# Patient Record
Sex: Female | Born: 1955 | Race: Black or African American | Hispanic: No
Health system: Midwestern US, Community
[De-identification: ages and names within clinical notes are randomized; demographics above are authoritative.]

## PROBLEM LIST (undated history)

## (undated) DIAGNOSIS — M25562 Pain in left knee: Secondary | ICD-10-CM

## (undated) DIAGNOSIS — I10 Essential (primary) hypertension: Secondary | ICD-10-CM

## (undated) DIAGNOSIS — R0602 Shortness of breath: Secondary | ICD-10-CM

## (undated) DIAGNOSIS — IMO0001 Reserved for inherently not codable concepts without codable children: Secondary | ICD-10-CM

## (undated) DIAGNOSIS — E7800 Pure hypercholesterolemia, unspecified: Secondary | ICD-10-CM

## (undated) DIAGNOSIS — R5383 Other fatigue: Secondary | ICD-10-CM

## (undated) DIAGNOSIS — Z1239 Encounter for other screening for malignant neoplasm of breast: Secondary | ICD-10-CM

## (undated) DIAGNOSIS — M797 Fibromyalgia: Secondary | ICD-10-CM

## (undated) DIAGNOSIS — M1990 Unspecified osteoarthritis, unspecified site: Secondary | ICD-10-CM

## (undated) DIAGNOSIS — G35D Multiple sclerosis, unspecified: Secondary | ICD-10-CM

## (undated) DIAGNOSIS — E119 Type 2 diabetes mellitus without complications: Secondary | ICD-10-CM

## (undated) DIAGNOSIS — J45909 Unspecified asthma, uncomplicated: Secondary | ICD-10-CM

## (undated) DIAGNOSIS — G4730 Sleep apnea, unspecified: Secondary | ICD-10-CM

## (undated) DIAGNOSIS — C801 Malignant (primary) neoplasm, unspecified: Secondary | ICD-10-CM

## (undated) DIAGNOSIS — G35 Multiple sclerosis: Secondary | ICD-10-CM

## (undated) HISTORY — PX: KNEE SURGERY: SHX244

## (undated) HISTORY — PX: VAGINA SURGERY: SHX829

## (undated) HISTORY — PX: APPENDECTOMY: SHX54

## (undated) HISTORY — PX: CARDIAC DEFIBRILLATOR PLACEMENT: SHX171

## (undated) LAB — HM DIABETES EYE EXAM

## (undated) MED FILL — INTERFERON BETA-1A (ALBUMIN) 44 MCG/0.5 ML SUBCUTANEOUS PEN INJECTOR: 44 44 mcg/0.5 mL | SUBCUTANEOUS | 28 days supply | Qty: 6 | Fill #0

## (undated) MED FILL — REBIF REBIDOSE 44 MCG/0.5 ML SUBCUTANEOUS PEN INJECTOR: 44 44 mcg/0.5 mL | SUBCUTANEOUS | 28 days supply | Qty: 6 | Fill #0

---

## 2003-04-18 NOTE — Unmapped (Signed)
Signed by Dub Mikes MD on 04/18/2003 at 00:00:00  [facility]      Imported By: Coletta Memos 04/05/2006 10:27:20    _____________________________________________________________________    External Attachment:    Please see [employer] EMR for this document.

## 2003-05-02 NOTE — Unmapped (Signed)
Signed by Dub Mikes MD on 05/02/2003 at 00:00:00  [facility]      Imported By: Coletta Memos 04/05/2006 10:27:12    _____________________________________________________________________    External Attachment:    Please see [employer] EMR for this document.

## 2003-05-30 NOTE — Unmapped (Signed)
Signed by Dub Mikes MD on 05/30/2003 at 00:00:00  [facility]      Imported By: Coletta Memos 04/05/2006 10:27:04    _____________________________________________________________________    External Attachment:    Please see [employer] EMR for this document.

## 2004-02-20 NOTE — Unmapped (Signed)
Signed by Dub Mikes MD on 02/20/2004 at 00:00:00  [facility]      Imported By: Coletta Memos 04/05/2006 10:26:56    _____________________________________________________________________    External Attachment:    Please see [employer] EMR for this document.

## 2004-02-21 NOTE — Unmapped (Signed)
Signed by Dub Mikes MD on 02/21/2004 at 00:00:00  [facility]      Imported By: Kathlynn Grate 02/28/2008 15:46:25    _____________________________________________________________________    External Attachment:    Please see [employer] EMR for this document.

## 2004-05-28 NOTE — Unmapped (Signed)
Signed by   LinkLogic on 05/28/2004 at 21:39:45  Patient: Melanie [name]  Note: All result statuses are Final unless otherwise noted.    Tests: (1) CBC (CBC)  ! WBC                       8.9 10*3/uL                 4.5-11.0  ! RBC                       4.34 10*6/uL                3.90-5.40  ! Hgb                       13.8 g/dL                   16.1-09.6  ! HCT                       41.0 %                      35.0-45.0  ! MCV                       94.6 fL                     82.0-98.0  ! MCH                       31.7 pg                     27.0-34.0  ! MCHC                      33.5 g/dL                   04.5-40.9  ! RDW                       12.2 %                      11.5-14.5  ! Platelet Count            231 10*3/uL                 140-400  ! MPV                       9.4 fL                      7.5-10.5    Note: An exclamation mark (!) indicates a result that was not dispersed into   the flowsheet.  Document Creation Date: 05/28/2004 9:39 PM  _______________________________________________________________________    (1) Order result status: Final  Collection or observation date-time: 05/28/2004 15:17  Requested date-time: 05/28/2004 15:17  Receipt date-time: 05/28/2004 17:25  Reported date-time: 05/28/2004 21:39  Referring Physician:    Ordering Physician: Dub Mikes [facility])  Specimen Source: WB&WHOLE BLOOD     LV5  REFRIG&LAV  (Refrig)  Source: Butler Denmark Order Number: [id number]  Lab site: [facility]  [id number]

## 2004-05-28 NOTE — Unmapped (Signed)
Signed by   [employer]Logic on 05/30/2004 at 10:09:51  Patient: Melanie [name]  Note: All result statuses are Final unless otherwise noted.    Tests: (1) URINE CULTURE (UC)  ! Clinical Report           Result Below...        RESULT: Specimen: URINE  Collected: 05/28/2004 15:17  (1) Gram Stn = LabDecision; Antib. Sens. = LabDecision     Status: Final     Last Update: 05/30/2004 10:09          Culture Result: (Final)      Mixed Skin/Urogenital Flora.  No Further Workup.    Note: An exclamation mark (!) indicates a result that was not dispersed into   the flowsheet.  Document Creation Date: 05/30/2004 10:09 AM  _______________________________________________________________________    (1) Order result status: Final  Collection or observation date-time: 05/28/2004 15:17  Requested date-time: 05/28/2004 15:17  Receipt date-time: 05/28/2004 18:27  Reported date-time: 05/30/2004 10:09  Referring Physician:    Ordering Physician: Dub Mikes [facility])  Specimen Source: U&URINE     UGRS&Boric Acid, Small Urine Tube  Source: Butler Denmark Order Number: [id number]  Lab site: [facility]  [id number]

## 2004-05-28 NOTE — Unmapped (Signed)
Signed by   LinkLogic on 05/28/2004 at 20:55:01  Patient: Creedence Zimny  Note: All result statuses are Final unless otherwise noted.    Tests: (1) URINALYSIS (UA)  ! Color                     Yellow                      Yellow,Straw  ! Clarity                   Clear                       Clear  ! Specific Gravity.    [H]  1.037                       1.005-1.035  ! pH                        6.0                         5.0-8.0  ! Protein, urine       [A]  Trace mg/dL                 Negative  ! Glucose, Urine            Negative mg/dL              Negative  ! Ketone                    Negative mg/dL              Negative  ! Bilirubin, Urine          Negative                    Negative  ! Blood                     Negative                    Negative  ! Nitrite                   Negative                    Negative  ! Urobilinogen              0.2 EU/dL                   5.3-6.6  ! Leukocyte Esterase        Negative                    Negative  ! Squamous, Epith           4 /HPF                      0-5  ! Bacteria             [A]  Moderate /HPF               None Seen    Note: An exclamation mark (!) indicates a result that was not dispersed into   the flowsheet.  Document Creation Date: 05/28/2004 8:55 PM  _______________________________________________________________________    Marland Kitchen  1) Order result status: Final  Collection or observation date-time: 05/28/2004 15:17  Requested date-time: 05/28/2004 15:17  Receipt date-time: 05/28/2004 17:25  Reported date-time: 05/28/2004 20:54  Referring Physician:    Ordering Physician: Dub Mikes [facility])  Specimen Source: U&URINE     UGRLR&Boric Acid,Large Urine Tube Refrig.  Source: Butler Denmark Order Number: [id number]  Lab site: [facility]      [place]  [place]  [address]

## 2004-05-28 NOTE — Unmapped (Signed)
Signed by   LinkLogic on 05/28/2004 at 21:39:45  Patient: Melanie [name]  Note: All result statuses are Final unless otherwise noted.    Tests: (1) DIFFERENTIAL (DIFF)  ! Neutrophil                58.9 %                      40.0-80.0  ! Lymphocyte                31.7 %                      15.0-45.0  ! Monocyte                  7.4 %                       0.0-12.0  ! Eosinophil                1.4 %                       0.0-8.0  ! Basophils                 0.6 %                       0.0-1.0  ! ABS NEUT                  5.2 10*3/uL                 1.0-8.0  ! Abs LYMPH                 2.8 10*3/uL                 0.6-3.2  ! Abs MONO                  0.7 10*3/uL                 0.0-1.0  ! Abs EOS                   0.1 10*3/uL                 0.0-0.6  ! Abs BASO                  0.1 10*3/uL                 0.0-0.1    Note: An exclamation mark (!) indicates a result that was not dispersed into   the flowsheet.  Document Creation Date: 05/28/2004 9:39 PM  _______________________________________________________________________    (1) Order result status: Final  Collection or observation date-time: 05/28/2004 15:17  Requested date-time: 05/28/2004 15:17  Receipt date-time: 05/28/2004 17:25  Reported date-time: 05/28/2004 21:39  Referring Physician:    Ordering Physician: Dub Mikes [facility])  Specimen Source: WB&WHOLE BLOOD     LV5  REFRIG&LAV  (Refrig)  Source: Butler Denmark Order Number: [id number]  Lab site: [facility]  [address]

## 2004-08-20 NOTE — Unmapped (Signed)
Signed by Dub Mikes MD on 08/20/2004 at 00:00:00  [facility]      Imported By: Coletta Memos 04/05/2006 10:26:47    _____________________________________________________________________    External Attachment:    Please see [employer] EMR for this document.

## 2005-03-09 NOTE — Unmapped (Signed)
Signed by Sallye Ober Rivers MA on 03/09/2005 at 12:23:31      Preload Clinical Lists   Problems added:   BRONCHITIS, ACUTE (ICD-466.0)

## 2005-08-14 LAB — URINALYSIS W/RFL TO MICROSCOPIC
Bilirubin Urine: NEGATIVE
Glucose, Ur: NEGATIVE mg/dL
Ketones, UA: NEGATIVE
Leukocytes, UA: NEGATIVE
Nitrite, UA: NEGATIVE
Specific Gravity, UA: 1.025 (ref 1.005–1.035)
Squam Epithel, UA: 6 /lpf (ref 0–5)
Urobilinogen, UA: 0.2 E units/dL (ref 0.2–1.0)
WBC, UA: 2 cells/hpf (ref 0–10)
pH, UA: 5 (ref 5.0–8.0)

## 2005-08-14 LAB — DIFFERENTIAL, MANUAL
Basophils Relative: 0.1 % (ref 0.0–1.0)
Eosinophils Relative: 2.8 % (ref 0.0–8.0)
Lymphocytes Relative: 33 % (ref 15.0–45.0)
Monocytes Relative: 6.2 % (ref 0.0–12.0)
Neutrophils Absolute: 5.9 10*3/uL (ref 1.0–8.0)
Neutrophils Relative: 57.9 % (ref 40.0–80.0)

## 2005-08-14 LAB — CBC
Hematocrit: 43.4 % (ref 35.0–45.0)
Hemoglobin: 14 g/dL (ref 12.0–16.0)
MCH: 31.2 pg (ref 27.0–34.0)
MCV: 96.7 fL (ref 81.0–103.0)
MPV: 11 fL (ref 9.5–12.5)
Platelets: 226 10*3/uL (ref 140–400)
RBC: 4.49 10*6/uL (ref 3.90–5.40)
RDW: 13.3 % (ref 11.5–14.5)
WBC: 10.1 10*3/uL (ref 4.5–11.0)

## 2005-08-14 NOTE — Unmapped (Signed)
Signed by   LinkLogic on 08/14/2005 at 18:31:15  Patient: Melanie [name]  Note: All result statuses are Final unless otherwise noted.    Tests: (1) DIFFERENTIAL (DIFF)    Neutrophil                57.9 %                      40.0-80.0    Lymphocyte                33.0 %                      15.0-45.0    Monocyte                  6.2 %                       0.0-12.0    Eosinophil                2.8 %                       0.0-8.0    Basophils                 0.1 %                       0.0-1.0    ABS NEUT                  5.9 10*3/uL                 1.0-8.0  ! Abs LYMPH            [H]  3.3 10*3/uL                 0.6-3.2  ! Abs MONO                  0.6 10*3/uL                 0.0-1.0  ! Abs EOS                   0.3 10*3/uL                 0.0-0.6  ! Abs BASO                  0.0 10*3/uL                 0.0-0.1    Note: An exclamation mark (!) indicates a result that was not dispersed into   the flowsheet.  Document Creation Date: 08/14/2005 6:31 PM  _______________________________________________________________________    (1) Order result status: Final  Collection or observation date-time: 08/14/2005 00:01  Requested date-time: 08/14/2005 00:00  Receipt date-time: 08/14/2005 17:46  Reported date-time: 08/14/2005 18:32  Referring Physician:    Ordering Physician: Dub Mikes [facility])  Specimen Source: WB&WHOLE BLOOD     LV5  REFRIG&LAV  (Refrig)  Source: Butler Denmark Order Number: [id number]  Lab site: [facility]      [address]      [place] [address]  [phone]

## 2005-08-14 NOTE — Unmapped (Signed)
Signed by   LinkLogic on 08/14/2005 at 20:25:22  Patient: Melanie [name]  Note: All result statuses are Final unless otherwise noted.    Tests: (1) URINALYSIS (UA)    Color                     Yellow                      Yellow,Straw    Clarity                   Clear                       Clear    Specific Gravity.         1.025                       1.005-1.035    Specific Gravity          1.025                       1.005-1.035    pH                        5.0                         5.0-8.0    Protein, urine       [A]  Trace mg/dL                 Negative    Glucose, Urine            Negative mg/dL              Negative    Ketone                    Negative mg/dL              Negative    Bilirubin, Urine          Negative                    Negative  ! Blood                     Negative                    Negative    Nitrite                   Negative                    Negative    Urobilinogen              0.2 EU/dL                   4.0-9.8    Leukocyte Esterase        Negative                    Negative    WBC.                      2 /HPF                      0-10    Squamous,  Epith      [H]  6 /HPF                      0-5    Bacteria             [A]  Few /HPF                    None Seen    Note: An exclamation mark (!) indicates a result that was not dispersed into   the flowsheet.  Document Creation Date: 08/14/2005 8:25 PM  _______________________________________________________________________    (1) Order result status: Final  Collection or observation date-time: 08/14/2005 00:01  Requested date-time: 08/14/2005 00:00  Receipt date-time: 08/14/2005 17:46  Reported date-time: 08/14/2005 20:26  Referring Physician:    Ordering Physician: Dub Mikes [facility])  Specimen Source: U&URINE     UGRLR&Boric Acid,Large Urine Tube Refrig.  Source: Butler Denmark Order Number: [id number]  Lab site: [facility]      [address]      [place] [address]   [phone]      -----------------    The following lab values were dispersed to the flowsheet  with no units conversion:      WBC., 2 /HPF, (F)  expected units: cells/hpf    Squamous, Epith, 6 /HPF, (F)  expected units: /lpf    -----------------    The following non-numeric lab results were dispersed to  the flowsheet even though numeric results were expected:      Glucose, Urine, Negative

## 2005-08-14 NOTE — Unmapped (Signed)
Signed by   LinkLogic on 08/14/2005 at 18:31:16  Patient: Melanie [name]  Note: All result statuses are Final unless otherwise noted.    Tests: (1) CBC (CBC)    WBC                       10.1 10*3/uL                4.5-11.0    RBC                       4.49 10*6/uL                3.90-5.40    Hgb                       14.0 g/dL                   27.0-62.3    HCT                       43.4 %                      35.0-45.0    MCV                       96.7 fL                     81.0-103.0    MCH                       31.2 pg                     27.0-34.0    MCHC                      32.3 g/dL                   76.2-83.1    RDW                       13.3 %                      11.5-14.5    Platelet Count            226 10*3/uL                 140-400    MPV                       11.0 fL                     9.5-12.5    Note: An exclamation mark (!) indicates a result that was not dispersed into   the flowsheet.  Document Creation Date: 08/14/2005 6:31 PM  _______________________________________________________________________    (1) Order result status: Final  Collection or observation date-time: 08/14/2005 00:01  Requested date-time: 08/14/2005 00:00  Receipt date-time: 08/14/2005 17:45  Reported date-time: 08/14/2005 18:32  Referring Physician:    Ordering Physician: Dub Mikes [facility])  Specimen Source: WB&WHOLE BLOOD     LV5  REFRIG&LAV  (Refrig)  Source: Butler Denmark Order Number: [id number]  Lab site: [facility]      [address]  [place] [address]  [phone]      -----------------    The following results were not dispersed to the flowsheet  because of errors during the import process:      MCHC, 32.3 g/dL, (F)

## 2005-11-08 NOTE — Unmapped (Signed)
Signed by Kirtland Bouchard MA on 11/08/2005 at 15:39:13    Phone Note   Call from Patient  Call back at Home Phone: [phone]  Caller: patient  Department: Neurology  Call for: [facility]    Summary of Call:  WOULD LIKE SAMPLES OF PROBIDEL.  PLEASE CALL BACK AT HOME     Initial call taken by: Willette Alma,  November 08, 2005 1:43 PM    This department is not currently using the EMR system. Please see the paper chart for this patient for the response to this phone message.      Follow-up for Phone Call   patient called, provigil samples taken to front for pickup.  Phone Call Completed  Follow-up by: Kirtland Bouchard,  November 08, 2005 3:39 PM

## 2005-12-29 NOTE — Unmapped (Signed)
Signed by Donia Guiles MA on 12/29/2005 at 14:02:17    Phone Note   Call from Patient  Call back at Home Phone: [phone]  Patient Cell Phone #: [phone]  Department: Neurology  Call for: [facility]  Reason for Call: speak with nurse    Summary of Call:  CAN YOU GIVE APPT BEFORE APRIL  WANTS TO SPEAK TO THE NURSE ALSO,  NO FURTHER INFO     Initial call taken by: Burnis Medin,  December 29, 2005 12:31 PM    This department is not currently using the EMR system. Please see the paper chart for this patient for the response to this phone message.      Follow-up for Phone Call   Left message for pt to return call  Phone Call Completed  Follow-up by: Megan Mans MA,  December 29, 2005 2:02 PM

## 2005-12-29 NOTE — Unmapped (Signed)
Signed by Donia Guiles MA on 12/30/2005 at 10:38:41    Phone Note   Call from Patient  Patient Cell Phone #: [phone] OR [phone]  Department: Neurology  Call for: [facility]  Reason for Call: medication issues, speak with nurse    Summary of Call:  SAMPLES OF PROVIGIL.  DO YOU HAVE ANY?     Initial call taken by: Burnis Medin,  December 29, 2005 3:33 PM    This department is not currently using the EMR system. Please see the paper chart for this patient for the response to this phone message.      Follow-up for Phone Call   Left message stating we don't have any Provigil samples. I will call the rep and find out if/when samples were mailed to the [facility].  Phone Call Completed  Follow-up by: Megan Mans MA,  December 30, 2005 10:38 AM

## 2005-12-31 NOTE — Unmapped (Signed)
Signed by Donia Guiles MA on 12/31/2005 at 16:17:37    Phone Note   Call from Patient  Call back at Home Phone: [phone]  Patient Cell Phone #: [phone] OR [phone]  Department: Neurology  Call for: [facility]  Reason for Call: refill medication, speak with nurse    Summary of Call:  NEEDS RX CALLED IN  (PROVIGIL)  PHARM: [facility] [address] [facility] [place]     Initial call taken by: Burnis Medin,  December 31, 2005 4:04 PM      Follow-up for Phone Call   [facility] [phone] and phoned in Provigil 200 mg 1 po q am #30 x 5.   Phone Call Completed  Follow-up by: Megan Mans MA,  December 31, 2005 4:17 PM

## 2006-01-10 NOTE — Unmapped (Signed)
Signed by Donia Guiles MA on 01/11/2006 at 08:35:35    Phone Note   Call from Pharmacy  Patient Cell Phone #: [phone] OR [phone]    Caller: Clydene Fake  Pharmacy Phone Number: [phone]  Summary of call: *** This is a Prescription Request for a department that is currently using the EMR, but for a patient that does not have the requested medication (s) populated;    Requesting Medication:  Provigil Tab  Rx. Number: [id number]    Dose/ Strength: 200MG    Instructions: take one tablet by mouth inthe morning   Quantity: 30   Last Filled Date: 04-08-2005   Last Filled By:    Pharmacy Name:    Pharmacy Phone #:    Pharmacy Fax #:    UCP Provider Name:           Initial call taken by: Elizabeth Sauer,  January 10, 2006 9:42 AM      Follow-up for Phone Call   [facility] and refilled x 3. Pt has appt in April.   Phone call completed  Follow-up by: Megan Mans MA,  January 11, 2006 8:35 AM

## 2006-01-18 LAB — OFFICE VISIT LAB RESULTS
Bilirubin Urine: NEGATIVE
Blood, UA: NEGATIVE
Glucose, UA: NEGATIVE
Ketones, UA: NEGATIVE
Leukocytes, UA: NEGATIVE
Nitrite, UA: NEGATIVE
Protein, UA: NEGATIVE
Specific Gravity, UA: 1.005
Urobilinogen, UA: NORMAL
pH, UA: 6

## 2006-01-18 NOTE — Unmapped (Signed)
Signed by Kirtland Bouchard MA on 01/18/2006 at 10:49:44    Phone Note   Patient Call    Call back at Home Phone: [phone]  Department: Neurology  Call for: nurse      Reason for call: thinks she is having an attack.  please call to discuss  doesn't know what to do.      Initial call taken by: Burnis Medin,  January 18, 2006 10:08 AM      Follow-up for Phone Call   Called patient, she went to lay down to sleep, her lower back started hurting and she got chest pain. When she stood up she was dizzy. The worse time lasted about 2 minutes. I told her to call her PCP and get in to be checked out.  Phone Call Completed, Called Patient  Follow-up by: Kirtland Bouchard,  January 18, 2006 10:45 AM

## 2006-01-18 NOTE — Unmapped (Signed)
Signed by Simeon Craft MD on 01/18/2006 at 19:23:10      Reason for Visit   Chief Complaint: LBP, chest pain, leg pain, dizzy, SOB    History from: patient    Allergies  ! COMPAZINE    Medications     Vital Signs:   Wt: 240 lbs.      degrees F  oral  Pulse: 90 (irregular)    Patient appears to be in acute distress: no  BP: 136/88    Pulse Oximetry:   O2 Saturation: 93 % Urine Dip: Done     Intake recorded by: Sharmon Leyden MA on January 18, 2006 11:13 AM      History of Present Illness: Pt works at night, went to lay down to go to sleep any she suddenly felt pain that got her right back up and it shot upward ot posteroir right back . Had pain shot up from back of leg and arm. Pain is much better now.          Physical Examination:       Constitutional   Inspection: alert, no acute distress, well hydrated, well nourished, appropriate dress.      Respiratory:   Auscultation: clear to auscultation.      Cardiovascular:   Auscultation: no murmur.      Abdomen   Palpation/Auscultation: non-distended, non-tender, no guarding, normal bowel sounds.    Liver/Spleen: no organomegaly.      Musculoskeletal      Spine/Ribs/Pelvis    Comments:  tender alon Left paraspinal muscles.    In office Procedures & Tests   Date/Time Collected: 01-19-05    Routine Urinalysis     Physical characteristics   Color: yellow  Appearance: clear    Chemical measurements   Glucose (mg/dL): negative  Bilirubin: negative  Ketone (mg/dL): negative  Spec. Gravity: 1.005  Blood: negative  pH: 6  Protein (mg/dL): negative  Urobilinogen (mg/dL): normal  Nitrite: negative  Leukocytes: negative      New Problems:  SPASM, MUSCLE (ICD-728.85)  BACKACHE NOS (ICD-724.5)  New Medications:  PROVIGIL 200 MG TABS (MODAFINIL) 1 po bid  REBIF 22 MCG/0.5ML SOLN (INTERFERON BETA-1A) injects 3 x/week.  VICODIN 5-500 MG TABS (HYDROCODONE-ACETAMINOPHEN) 1 tab po q 6 hrs prn pain  FLEXERIL 10 MG TABS (CYCLOBENZAPRINE HCL) 1 tab o q 8 hours prn spasm  New Allergies:  !  COMPAZINE  Assessment and Plan  New Problems:  Dx of SPASM, MUSCLE (ICD-728.85)   left lumbar Onset: 01/18/2006  Dx of BACKACHE NOS (ICD-724.5)  Onset: 01/18/2006    Medications   New Prescriptions/Refills:  FLEXERIL 10 MG TABS (CYCLOBENZAPRINE HCL) 1 tab o q 8 hours prn spasm  #15 x 0 : Marlana Salvage Lauriann Milillo MD (01/18/2006)  VICODIN 5-500 MG TABS (HYDROCODONE-ACETAMINOPHEN) 1 tab po q 6 hrs prn pain  #20 x 0 : Marlana Salvage Markian Glockner MD (01/18/2006)    New medications:  PROVIGIL 200 MG TABS -- 1 po bid  Start date: 01/18/2006  REBIF 22 MCG/0.5ML SOLN -- injects 3 x/week.  Start date: 01/18/2006  VICODIN 5-500 MG TABS -- 1 tab po q 6 hrs prn pain  Start date: 01/18/2006  FLEXERIL 10 MG TABS -- 1 tab o q 8 hours prn spasm  Start date: 01/18/2006      Today's Orders   99213 - Ofc Vst, Est Level III [CPT-99213]  UA Dipstick (Office) [CPT-81002]            Prescriptions:  FLEXERIL 10 MG TABS (CYCLOBENZAPRINE HCL) 1 tab o q 8 hours prn spasm  #15 x 0   Entered and Authorized by: Simeon Craft MD   Signed by: Marlana Salvage [id number] MD on 01/18/2006   Method used: Print then Give to Patient  VICODIN 5-500 MG TABS (HYDROCODONE-ACETAMINOPHEN) 1 tab po q 6 hrs prn pain  #20 x 0   Entered and Authorized by: Simeon Craft MD   Signed by: Marlana Salvage [id number] MD on 01/18/2006   Method used: Print then Give to Patient            ]

## 2006-02-04 NOTE — Unmapped (Signed)
Signed by Donia Guiles MA on 02/04/2006 at 15:17:15    Phone Note   Patient Call    Department: Neurology  Call for: [facility]      Follow-up for Phone Call   Tried to call pt at work but they said she was not there. I called and left message at her home that I would leave samples at the front desk and right now I don't have  a sooner appt with Dr. Cherylann Banas.  Phone Call Completed  Follow-up by: Megan Mans MA,  February 04, 2006 3:17 PM    Reason for call: Wants to know if you have any samples of Provigil, would like to stop aby between 3-3:30 to pick some up. Would also like to know if she can get sooner appt.  Can reach at [phone]      Initial call taken by: Willette Alma,  February 04, 2006 2:53 PM    This department is not currently using the EMR system. Please see the paper chart for this patient for the response to this phone message.

## 2006-03-04 NOTE — Unmapped (Signed)
Signed by Simeon Craft MD on 03/04/2006 at 14:43:17      Reason for Visit   Chief Complaint: Flu like symptoms X 3 days    History from: patient    Allergies  ! COMPAZINE    Medications   Current Meds:   PROVIGIL 200 MG TABS (MODAFINIL) 1 po bid  REBIF 22 MCG/0.5ML SOLN (INTERFERON BETA-1A) injects 3 x/week.  VICODIN 5-500 MG TABS (HYDROCODONE-ACETAMINOPHEN) 1 tab po q 6 hrs prn pain  FLEXERIL 10 MG TABS (CYCLOBENZAPRINE HCL) 1 tab o q 8 hours prn spasm        Vital Signs:   Wt: 245 lbs.      Wt chg (lbs): 5  Temperature: 98.2  degrees F  oral    Patient appears to be in acute distress: no  BP: 148/70    Intake recorded by: Sharmon Leyden MA on March 04, 2006 11:40 AM      History of Present Illness: Began 3 days ago with body aches, head congestion. lost voice, cough, still some phlegm.          Physical Exam- Detail:   General Appearance: well-developed, well-nourished and in no acute distress.  Ears: No lesions.  Tympanic membranes translucent, non-bulging.  Canal walls pink, without discharge.  Hearing grossly intact.  Oropharynx: can't visualize pharynx very well due to tongue size  Respiratory: Respiration un-labored.  Lung fields clear to auscultation.  No wheezing, rales, rhonchi or pleural rub.  Neck: Full ROM.  No thyromegaly, no nodules, masses, or tenderness.   Cardiac: S1 and S2 normal.  RRR without murmurs, rubs, gallops.  No JVD.        New Problems:  UPPER RESPIRATORY INFECTION, VIRAL (ICD-465.9)  New Medications:  PROMETHAZINE-CODEINE 6.25-10 MG/5ML SYRP (PROMETHAZINE-CODEINE) 1 tsp po q 4-6 hours prn cough  ZITHROMAX Z-PAK 250 MG TABS (AZITHROMYCIN) take as directed    Assessment and Plan  Status of Existing Problems:  Assessed BRONCHITIS, ACUTE as comment only - Simeon Craft MD - Signed  New Problems:  Dx of UPPER RESPIRATORY INFECTION, VIRAL (ICD-465.9)  Onset: 03/04/2006    Medications   New Prescriptions/Refills:  ZITHROMAX Z-PAK 250 MG TABS (AZITHROMYCIN) take as directed  #1  pak x 0 :  Simeon Craft MD (03/04/2006)  PROMETHAZINE-CODEINE 6.25-10 MG/5ML SYRP (PROMETHAZINE-CODEINE) 1 tsp po q 4-6 hours prn cough  #4 oz x 0 : Simeon Craft MD (03/04/2006)    New medications:  PROMETHAZINE-CODEINE 6.25-10 MG/5ML SYRP -- 1 tsp po q 4-6 hours prn cough  Start date: 03/04/2006  ZITHROMAX Z-PAK 250 MG TABS -- take as directed  Start date: 03/04/2006      Today's Orders   99213 - Ofc Vst, Est Level III [CPT-99213]            Prescriptions:  ZITHROMAX Z-PAK 250 MG TABS (AZITHROMYCIN) take as directed  #1  pak x 0   Entered and Authorized by: Simeon Craft MD   Signed by: Marlana Salvage FAOZHYQM MD on 03/04/2006   Method used: Print then Give to Patient  PROMETHAZINE-CODEINE 6.25-10 MG/5ML SYRP (PROMETHAZINE-CODEINE) 1 tsp po q 4-6 hours prn cough  #4 oz x 0   Entered and Authorized by: Simeon Craft MD   Signed by: Marlana Salvage VHQIONGE MD on 03/04/2006   Method used: Print then Give to Patient

## 2006-03-04 NOTE — Unmapped (Signed)
Signed by Ames Coupe MA on 03/04/2006 at 12:15:22      Work Excuse   This patient was seen in the office 03/04/2006.  May not return to work until 03/07/2006.  Comments: Please excuse for 03-03-06 also.

## 2006-03-08 NOTE — Unmapped (Signed)
Signed by Simeon Craft MD on 03/08/2006 at 11:44:39      Work Excuse   This patient was seen in the office 03/04/2006.  May not return to work until 03/09/2006.

## 2006-03-08 NOTE — Unmapped (Signed)
Signed by Ames Coupe MA on 03/08/2006 at 17:51:09    Phone Note   Patient Call  Call back at Home Phone: [phone]  Caller: patient  Call for: AD    Reason for Call: speak with provider  Pharmacy Information: [phone]  [facility]   Summary of Call: pt was seen 03/04/06 and was given meds-pt stillhas headache and eyes feel puffy,pt's cough has sbusided some. Pt still coughs alot whenshe laughs and is leaking urine.   Pls call pt asap.     Initial call taken by: Rudi Coco,  March 08, 2006 10:28 AM      New Medications:  AUGMENTIN 500-125 MG TABS (AMOXICILLIN-POT CLAVULANATE) 1 tab po tid    Follow-up for Phone Call   Still coughing. Hasn't returned to work. Is no worse. COugh med made her drowsy. Stopped it . Eyes feel puffy. Has had allergies before. will call in abx again and may take claritin.   Follow-up by: Simeon Craft MD,  March 08, 2006 11:43 AM    Additional Follow-up for Phone Call   Phone Call Completed, Rx Completed  Additional Follow-up by: Sharmon Leyden MA,  March 08, 2006 5:51 PM  Prescriptions:  AUGMENTIN 500-125 MG TABS (AMOXICILLIN-POT CLAVULANATE) 1 tab po tid  #30 x 0   Entered and Authorized by: Simeon Craft MD   Signed by: Marlana Salvage OZHYQMVH MD on 03/08/2006   Method used: Telephoned to ...         call in.

## 2006-03-21 NOTE — Unmapped (Signed)
Signed by Donia Guiles MA on 03/21/2006 at 13:34:53    Phone Note   Patient Call    Call back at Home Phone: [phone]  Call for: [facility]      Follow-up for Phone Call   Spoke with pt. Told her I do have samples and I would leave them at the front desk for her to pick up.   Phone Call Completed  Follow-up by: Megan Mans MA,  March 21, 2006 1:34 PM    Reason for call: WANTS TO KNOW IF YOU SAMPLES OF PROVIGIL 200MG       Initial call taken by: Earnest Conroy,  March 21, 2006 12:52 PM

## 2006-03-29 NOTE — Unmapped (Signed)
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  VICODIN 5-500 MG TABS (HYDROCODONE-ACETAMINOPHEN) 1 tab po q 6 hrs prn pain  FLEXERIL 10 MG TABS (CYCLOBENZAPRINE HCL) 1 tab o q 8 hours prn spasm  PROMETHAZINE-CODEINE 6.25-10 MG/5ML SYRP (PROMETHAZINE-CODEINE) 1 tsp po q 4-6 hours prn cough  ZITHROMAX Z-PAK 250 MG TABS (AZITHROMYCIN) take as directed  AUGMENTIN 500-125 MG TABS (AMOXICILLIN-POT CLAVULANATE) 1 tab po tid    Allegies added:   ! COMPAZINE    Past History  Past Medical History:  Hypertension    Surgical History:  Hysterectomy: Partial , Knee surgery  Appendix       Family History: Laryngeal cancer, stomach cancer, skin cancer, brain cancer,   Sister with diabetes     Social History: Marital Status: divorced,   Employment Status: employed full-time,   Occupation: Teacher, music  Patient Lives at: apartment,   Alcohol Use: occasionally  Tobacco Usage:non-smoker

## 2006-03-31 NOTE — Unmapped (Signed)
Signed by Dub Mikes MD on 04/01/2006 at 07:33:06    Multiple Sclerosis Visit      History of Present Illness- Multiple Sclerosis:   Chief Compliant: Follow Up MS  History From: patient  Handedness: right  HPI: I had the privilege of seeing Mrs Mckone for a follow-up appointment. She was supposed to follow-up about a year ago, she tells me she had a hard time getting an appointment.   She tells me that overall her condition has been stable since our last visit. She recently had some infections requiring antibiotics, but beyond that her symptoms have been stable. Her main issue remains her fatigue. She is using Provigil, which is of great help. She works night shifts, and typically she sleeps between 8AM-2PM with some interruptions.   She is very clear on the fact that she developed no new symptoms. She mentioned that her eyes have become very tender to the point that she prefers not to use contact lenses. She asked me whether this could be MS related, and I consider that highly unlikely. She has no symptoms suggestive of ON or INO. She mentions HA-s which can be severe at times and most of the time they respond to OTC analgesics. She had a revent knee injury and still has some difficulty walking related to that.   On detailed ROS questioning she denied dysphagia or dysarthria, but mentions some constipation on and off.  Has increased frequency and urgency but she does not feel this is of major concern. She has some burning/buzzing tingling on and off, as well as some tendencies for pain.       Past History  Past Medical History (reviewed - no changes required):  Hypertension    Surgical History (reviewed - no changes required):  Hysterectomy: Partial , Knee surgery  Appendix       Family History (reviewed - no changes required): Laryngeal cancer, stomach cancer, skin cancer, brain cancer,   Sister with diabetes     Social History (reviewed - no changes required): Marital Status: divorced,   Employment Status:  employed full-time,   Occupation: Teacher, music  Patient Lives at: apartment,   Alcohol Use: occasionally  Tobacco Usage:non-smoker      Review of Systems   Refer to HPI for review of systems documentation.  General: Denies any specific issues at this time.   Ears/Nose/Throat: Denies any specific issues at this time.   Cardiovascular: Denies any specific issues at this time.   Respiratory: Denies any specific issues at this time.   Gastrointestinal: Denies any specific issues at this time.   Genitourinary: Denies any specific issues at this time.   Musculoskeletal: Denies any specific issues at this time.   Skin: Denies any specific issues at this time.   Neurologic: Denies any specific issues at this time.   Psychiatric: Denies any specific issues at this time.   Endocrine: Denies any specific issues at this time.   Heme/Lymphatic: Denies any specific issues at this time.   Allergic/Immunologic: Denies any specific issues at this time.       Intake-Neurology   Chief Complaint: Follow Up MS  Handedness: right    Vital Signs Weight: 238 pounds  Pulse rate: 60   BP #1: 140 / 94mm Hg Wt chg: -7      Provider Information:   Primary Care Provider: [name], MD  PCP Address: [address] [place], [place] [address]    Allergies  ! COMPAZINE  Allergy and adverse  reaction list reviewed during this update.    New Medication:  REBIF 44 MCG/0.5ML SOLN (INTERFERON BETA-1A) 1 injection TIW  LISINOPRIL 20 MG TABS (LISINOPRIL) 1 po QD  Meds Removed:  VICODIN 5-500 MG TABS (HYDROCODONE-ACETAMINOPHEN) 1 tab po q 6 hrs prn pain  PROMETHAZINE-CODEINE 6.25-10 MG/5ML SYRP (PROMETHAZINE-CODEINE) 1 tsp po q 4-6 hours prn cough  ZITHROMAX Z-PAK 250 MG TABS (AZITHROMYCIN) take as directed  AUGMENTIN 500-125 MG TABS (AMOXICILLIN-POT CLAVULANATE) 1 tab po tid    Intake recorded by: Megan Mans MA  March 31, 2006 4:21 PM          Physical Exam-Neurology   Appearance: well developed, well nourished and in no acute distress  Language/Speech:  no aphasia and no dysarthria  Atten/concentration: awake and alert  Orientation: oriented x3  Memory: grossly intact  Fund of Knowledge: grossly intact  Ophthalmoscopic: no papilledema/good venous pulsations  Pupils: equal, round, reactive to light and accomodation  Visual Fields:   normal  CN 3,4,6 (EOM): extraocular movements intact, normal gaze alignment, and no nystagmus  Cranial Nerve 5 (Trigeminal): bilateral facial sensation normal  Cranial Nerve 7 (Facial): normal facial symmetry and strength  CN 8 (Auditory): proper hearing to finger rub bilaterally  Cranial Nerve 9 (Glossophar): soft palate elevates in the midline spontaneously  CN 11 (spinal access): full shoulder shrug equally  Cranial Nerve 12 (Hypoglossal): tongue is midline on protrusion    Muscle Strength   Muscle strength is 5/5 in all major muscle groups.    Muscle Tone Examination   Muscle tone is normal in all muscle groups of the upper and lower extremities.    Reflexes   Muscle stretch reflexes are normal in bilateral upper and lower extremities with negative Babinski responses.    Sensory Examination   Normal to light touch, pinprick, temperature, vibration and proprioception.      Coordination/ Station & Gait:   Coordination is normal to rapid alternating movements and finger to nose testing.  Station and gait are normal to regular, heel, toe and tandem walk. Romberg is (-).        Medications   New medications:  REBIF 44 MCG/0.5ML SOLN -- 1 injection TIW  LISINOPRIL 20 MG TABS -- 1 po QD    Assessment and Plan Comments   RRMS, with fatigue. New onset HA-s, trasnient pain and sensory symptoms.  To control the above symtpoms, we will try Topamax. This will hopefully help with her weight loss efforts as well. I suggested she should have a thorough eye exam to determine why she can't tolerate contact lenses. I suggested a repeat MRI should be done in 3 months, followed by a return visit. I also suggested interferon labs (CBC and LFT-s) need to  be done, and a TSH.   We discussed the above recommendations in details and I answered all her questions. I encouraged her to call with questions or concerns at any time. I spent a total of 30 minutes with her, so I consider this a highly complex follow-up appointment.       Today's Orders   99215 - Ofc Vst, Est Level V [facility]               ]

## 2006-03-31 NOTE — Unmapped (Signed)
Signed by Donia Guiles MA on 03/31/2006 at 16:57:24    Clinical Lists Changes    Medications:  Added new medication of TOPAMAX 25 MG TABS (TOPIRAMATE) 1 po QHS x 1 week, then 1 po BID x 1 week, then 1 po QAM and 2 po QHS x 1 week then 2 po BID - Signed  Rx of TOPAMAX 25 MG TABS (TOPIRAMATE) 1 po QHS x 1 week, then 1 po BID x 1 week, then 1 po QAM and 2 po QHS x 1 week then 2 po BID;  #120 x 5;  Signed;  Entered by: Megan Mans MA;  Authorized by: Dub Mikes MD;  Method used: Print then Give to Patient

## 2006-04-07 NOTE — Unmapped (Signed)
Signed by Donia Guiles MA on 04/07/2006 at 14:09:54    Phone Note   Patient Call    Caller: patient  Department: Neurology  Call for: Dr Cherylann Banas    Reason for call: wants to know if we have any samples of topamax, call pt at home       Initial call taken by: Beckey Rutter,  April 07, 2006 1:35 PM      Follow-up for Phone Call   Spoke with pt. Notified her I only have 1 bottle of 25 mg tablets. Pt will come by office to pick up.   Phone Call Completed  Follow-up by: Megan Mans MA,  April 07, 2006 2:09 PM

## 2006-04-11 NOTE — Unmapped (Signed)
Signed by Donia Guiles MA on 04/11/2006 at 13:39:31    Phone Note   Patient Call    Call back at Home Phone: [phone]  Callers Cell Phone #: [phone]  Caller: patient  Department: Neurology  Call for: Dr Cherylann Banas    Follow-up for Phone Call   Left voice mail message for pt instructing on how to take Topamax.   Phone Call Completed  Follow-up by: Megan Mans MA,  April 11, 2006 1:39 PM    Reason for call: was given samples of new med last week but hasnt started yet she was unsure to the directions, please call pt at home , she works nights call after 1: 00pm      Initial call taken by: Beckey Rutter,  April 11, 2006 9:15 AM

## 2006-04-18 NOTE — Unmapped (Signed)
Signed by Chipper Oman on 04/18/2006 at 22:00:08    March 31, 2006        [name], M.D.  [address], [place] [address]  [place], [place]  [address]    RE: Melanie [name]   DOB:  [birth year]    Dear Dr. Vanita Panda:    I had the pleasure of seeing your patient, Melanie [name], in consultation in the [facility] on 03/31/2006.     History of Present Illness:  I had the privilege of seeing Melanie [name] for a follow-up appointment. She was supposed to follow-up about a year ago, she tells me she had a hard time getting an appointment.    She tells me that overall her condition has been stable since our last visit. She recently had some infections requiring antibiotics, but beyond that her symptoms have been stable. Her main issue remains her fatigue. She is using Provigil, which is of great help. She works night shifts, and typically she sleeps between 8AM-2PM with some interruptions.   She is very clear on the fact that she developed no new symptoms. She mentioned that her eyes have become very tender to the point that she prefers not to use contact lenses. She asked me whether this could be MS related, and I consider that highly unlikely. She has no symptoms suggestive of ON or INO. She mentions HA-s which can be severe at times and most of the time they respond to OTC analgesics. She had a recent knee injury and still has some difficulty walking related to that.     On detailed ROS questioning she denied dysphagia or dysarthria, but mentions some constipation on and off.  Has increased frequency and urgency but she does not feel this is of major concern. She has some burning/buzzing tingling on and off, as well as some tendencies for pain.      Impression & Plan Comments:  RRMS, with fatigue. New onset HA-s, transient pain and sensory symptoms.    To control the above symptoms, we will try Topamax. This will hopefully help with her weight loss efforts as well. I suggested she should have a  thorough eye exam to determine why she can't tolerate contact lenses. I suggested a repeat MRI should be done in 3 months, followed by a return visit. I also suggested interferon labs (CBC and LFT-s) need to be done, and a TSH.   We discussed the above recommendations in details and I answered all her questions. I encouraged her to call with questions or concerns at any time. I spent a total of 30 minutes with her, so I consider this a highly complex follow-up appointment.     Thank you for allowing me to participate in the care of this pleasant patient.  If you would like a copy of my office note, please contact our Medical Records department at [phone].  Please feel free to contact me should you have any concerns or questions.    Sincerely,        Dub Mikes, M.D.    IP/nrs

## 2006-05-03 NOTE — Unmapped (Signed)
Signed by Donia Guiles MA on 05/03/2006 at 14:04:06    Phone Note   Patient Call    Department: Neurology  Call for: [facility]      Summary of Call:  pt needs samples of Provigil as she is completely out , call her at home when she can pick up      Initial call taken by: Beckey Rutter,  May 03, 2006 12:59 PM      Follow-up for Phone Call   Spoke with pt. I have 2  boxes of Provigil that she will come and pick up  Phone Call Completed  Follow-up by: Megan Mans MA,  May 03, 2006 2:03 PM

## 2006-06-02 NOTE — Unmapped (Signed)
Signed by Donia Guiles MA on 06/02/2006 at 10:34:00    Prescriptions:  REBIF 44 MCG/0.5ML SOLN (INTERFERON BETA-1A) 1 injection TIW  #12 x 6   Entered by: Megan Mans MA   Authorized by: Dub Mikes MD   Signed by: Megan Mans MA on 06/02/2006   Method used: Telephoned to ...     [facility]     [address]     [place], [place]  [address]     Ph: [phone]     Fax: [phone]

## 2006-06-03 NOTE — Unmapped (Signed)
Signed by Donia Guiles MA on 06/03/2006 at 10:31:00    Phone Note   Patient Call    Call back at Home Phone: [phone]  Callers Cell Phone #: [phone]  Department: Neurology  Call for: Danielle    Reason for call: Needs samples of provigil today if possible, she is out.  Please call pt at home or on cell to let her know if she can get them      Initial call taken by: Willette Alma,  June 03, 2006 9:32 AM      Follow-up for Phone Call   Spoke with pt and notified her I do have some samples of Provigil. She come to office to pick up  Phone Call Completed  Follow-up by: Megan Mans MA,  June 03, 2006 10:30 AM

## 2006-06-17 NOTE — Unmapped (Signed)
Signed by Donia Guiles MA on 06/20/2006 at 10:49:54    Phone Note   Patient Call    Call back at Home Phone: [phone]  Caller's Cell Phone #: [phone]  Caller: patient  Call for: [facility]      Summary of Call:  PT WOULD LIKE DANIELLE TO CALL HER BEFORE SHE LEAVES TODAY.     Initial call taken by: Marinell Blight,  June 17, 2006 4:19 PM      Follow-up for Phone Call   Spoke with pt. She said she was unable to tolerate the Topamax due to side effects. She was wondereing about the grapefruit diet to make sure it was ok for her to do. Notified pt that was fine to do.   Phone Call Completed  Follow-up by: Megan Mans MA,  June 20, 2006 10:49 AM

## 2006-07-05 NOTE — Unmapped (Signed)
Signed by Donia Guiles MA on 07/05/2006 at 11:08:10    Phone Note   Patient Call    Call back at Home Phone: [phone]  Caller: patient  Call for: [facility]      Summary of Call:  PT CALLING TO SPEAK WITH DANIELLE. WOULD LIKE TO KNOW IF YOU HAVE ANYMORE SAMPLES OF THE PROVIGIL 200MG  TAB. AND ALSO STATING THAT SHE'S EXPERIENCING SOME LOWER BACK PAIN. PT STATING SHE HAS TRIED TAKING ADVIL FOR THIS PAIN BUT HASN'T HELPED.     Initial call taken by: Marinell Blight,  July 05, 2006 10:30 AM      Follow-up for Phone Call   Spoke with pt and told her I do have some samples of Provigil. Will leave at the front desk. Pt made appt with PCP regarding her back pain.   Phone Call Completed  Follow-up by: Megan Mans MA,  July 05, 2006 11:08 AM

## 2006-07-05 NOTE — Unmapped (Signed)
Signed by Ames Coupe MA on 07/05/2006 at 16:20:19      Work Excuse   This patient was seen in the office 07/05/2006.  May not return to work until 07/06/2006.  Comments: Pt was also missed 07-04-06 please excuse.

## 2006-07-05 NOTE — Unmapped (Signed)
Signed by Simeon Craft MD on 07/05/2006 at 18:32:37      Reason for Visit   Chief Complaint: LBP X 2 days    History from: patient    Allergies  ! COMPAZINE    Medications   Current Meds:   PROVIGIL 200 MG TABS (MODAFINIL) 1 po bid  REBIF 44 MCG/0.5ML SOLN (INTERFERON BETA-1A) 1 injection TIW  FLEXERIL 10 MG TABS (CYCLOBENZAPRINE HCL) 1 tab o q 8 hours prn spasm  LISINOPRIL 20 MG TABS (LISINOPRIL) 1 po QD  TOPAMAX 25 MG TABS (TOPIRAMATE) 1 po QHS x 1 week, then 1 po BID x 1 week, then 1 po QAM and 2 po QHS x 1 week then 2 po BID        Vital Signs:   Wt: 247 lbs.      Wt chg (lbs): 9    Patient appears to be in acute distress: no  BP: 136/84    Intake recorded by: Sharmon Leyden MA on July 05, 2006 3:13 PM      History of Present Illness: Pt here with c/o low back pain for 2 days. feels a little better today. 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 Has a stool the she can climb. every now and then she just reaches rather than use the stool. 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 No pain radiating to leg.         Tried a Furniture conservator/restorer pill for bach aches. Pt points to lower lumbar spine.      Review of Systems       Physical Examination:     Additional Vitals:   BP: 136/  84  Lumbar Spine: mild lumbar paraspinal muscle tenderness.        New Problems:  LUMBAR STRAIN, ACUTE (ICD-847.2)  New Medications:  VICODIN 5-500 MG TABS (HYDROCODONE-ACETAMINOPHEN) 1 tab by mouth every 6 hours as needed pain  NAPROSYN 500 MG TABS (NAPROXEN) 1 tab by mouth every 12 hours    Assessment and Plan  New Problems:  Dx of LUMBAR STRAIN, ACUTE (ICD-847.2)  Onset: 07/05/2006    Medications   New Prescriptions/Refills:  NAPROSYN 500 MG TABS (NAPROXEN) 1 tab by mouth every 12 hours  #30 x 0 : Marlana Salvage Tanay Massiah MD (07/05/2006)  VICODIN 5-500 MG TABS (HYDROCODONE-ACETAMINOPHEN) 1 tab by mouth every 6 hours as needed pain  #20 x 0 : Marlana Salvage [id number] MD (07/05/2006)  FLEXERIL 10 MG TABS  (CYCLOBENZAPRINE HCL) 1 tab o q 8 hours prn spasm  #30 x 0 : Marlana Salvage Braxton Weisbecker MD (07/05/2006)    New medications:  VICODIN 5-500 MG TABS -- 1 tab by mouth every 6 hours as needed pain  Start date: 07/05/2006  NAPROSYN 500 MG TABS -- 1 tab by mouth every 12 hours  Start date: 07/05/2006      Today's Orders   99213 - Ofc Vst, Est Level III [CPT-99213]              Prescriptions:  NAPROSYN 500 MG TABS (NAPROXEN) 1 tab by mouth every 12 hours  #30 x 0   Entered and Authorized by: Simeon Craft MD   Signed by: Marlana Salvage [id number] MD on 07/05/2006   Method used: Print then Give to Patient  VICODIN 5-500 MG TABS (HYDROCODONE-ACETAMINOPHEN) 1 tab by mouth every 6 hours as needed pain  #20 x 0   Entered and Authorized by:  Simeon Craft MD   Signed by: Marlana Salvage [id number] MD on 07/05/2006   Method used: Print then Give to Patient  FLEXERIL 10 MG TABS (CYCLOBENZAPRINE HCL) 1 tab o q 8 hours prn spasm  #30 x 0   Entered and Authorized by: Simeon Craft MD   Signed by: Marlana Salvage [id number] MD on 07/05/2006   Method used: Print then Give to Patient              ]

## 2006-07-11 NOTE — Unmapped (Signed)
Signed by Dub Mikes MD on 07/11/2006 at 00:00:00  Medical Certification of Health Care Provider      Imported By: Maryellen Pile 07/25/2006 08:30:44    _____________________________________________________________________    External Attachment:    Please see [facility] EMR for this document.

## 2006-07-11 NOTE — Unmapped (Signed)
Signed by Donia Guiles MA on 07/11/2006 at 11:08:08    Phone Note   Patient Call    Call back at Home Phone: [phone]  Department: Neurology  Call for: Danielle    Reason for call: FMLA at work at work has expired, please fill out  another form and fax to benefits office hopefully by tomorrow. Will call back with fax #.  Please call to let her know if it can't be done by tomorrow, please call home and leave a message.      Initial call taken by: Willette Alma,  July 11, 2006 10:54 AM      Follow-up for Phone Call   [facility] [employer] TO-ATTN [phone]  Follow-up by: Blanch Media,  July 11, 2006 11:02 AM    Additional Follow-up for Phone Call   Called pt and told her she would have to have her HR dept fax me the [facility] paperwork. I do not have it. Pt will have them fax  Phone Call Completed  Additional Follow-up by: Megan Mans MA,  July 11, 2006 11:08 AM

## 2006-08-04 NOTE — Unmapped (Signed)
Signed by Donia Guiles MA on 08/04/2006 at 11:54:28    Phone Note   Patient Call    Call back at Home Phone: [phone]  Call for: [facility]      Summary of Call:  PT WOULD LIKE TO COME PICK UP PROVIGIL SAMPLES TODAY. PLEASE CALL HOME NUMBER.     Initial call taken by: Blanch Media,  August 04, 2006 10:21 AM      Follow-up for Phone Call   Spoke with pt and told her I would leave samples up at the front desk for her to pick up  Phone Call Completed, Called Patient  Follow-up by: Megan Mans MA,  August 04, 2006 11:54 AM

## 2006-09-01 ENCOUNTER — Ambulatory Visit

## 2006-09-07 NOTE — Unmapped (Signed)
Signed by Donia Guiles MA on 09/07/2006 at 14:52:55    Phone Note   Patient Call    Call back at Home Phone: [phone]  Caller: patient  Call for: [facility]    Reason for call: PT CALLING TO SPEAK WITH THE NURSE TO SEE IF SHE HAS ANY SAMPLES OF PROVIGIL THAT SHE COULD HAVE.       Initial call taken by: Marinell Blight,  September 07, 2006 2:26 PM      Follow-up for Phone Call   Tried to reach pt several times but line was busy. I left samples at the front desk for her to pick up  Phone Call Completed, Called Patient  Follow-up by: Megan Mans MA,  September 07, 2006 2:52 PM

## 2006-10-03 NOTE — Unmapped (Signed)
Signed by Donia Guiles MA on 10/03/2006 at 14:23:45    Phone Note   Patient Call    Call back at Home Phone: [phone]  Call for: [facility]      Summary of Call:  PT STATED THAT SHE NEEDS SAMPLES OF PROVIGIL. STATED TO CALL WHEN READY, AND SHE WILL PICK UP.     Initial call taken by: Blanch Media,  October 03, 2006 2:01 PM      Follow-up for Phone Call   Will leave samples at front desk. Spoke with pt and informed her of this.   Phone Call Completed, Called Patient  Follow-up by: Megan Mans MA,  October 03, 2006 2:23 PM

## 2006-10-21 NOTE — Unmapped (Signed)
Signed by Donia Guiles MA on 10/21/2006 at 11:13:10    Phone Note   Patient Call  Caller's Cell Phone #: [phone]  Caller: patient  Department: Neurology  Call for: PRIKO    Summary of Call: PT'S DAUGHTER IS COMING TO TO PICK UP PROVIGIL SAMPLES  SHE SAID THE MEDICATION THAT WAS CALLED WAS FOR PROVIGIL WAS CALLED IN BY MISTAKE SHOULD HAVE BEEN  ANOTHER MED CALLED IN. SHE WOULD LIKE YOU TO CALL HER  ON HER CELL PHONE TO DISCUSS THIS. IN THE MEANTIME THE  DAUGHTER IS COMING IN TO PICK UP PROVIGIL SAMPLES.  THE PT SAID SHE GETS SAMPLES EVERY MONTH     Initial call taken by: Earnest Conroy,  October 21, 2006 10:16 AM      Follow-up for Phone Call   Spoke with pt. She said the pharmacy called for the wrong refill. It was suppose to be for Rebif not Provigil. Pt will be coming to pick up samles. Left at the front desk. Pt already had refills on Rebif so I didn't need to call into pharmacy  phone call completed, appointment scheduled today  Follow-up by: Megan Mans MA,  October 21, 2006 11:13 AM

## 2006-10-21 NOTE — Unmapped (Signed)
Signed by Donia Guiles MA on 10/21/2006 at 09:48:11    Prescriptions:  PROVIGIL 200 MG TABS (MODAFINIL) 1 po bid  #60 x 5   Entered by: Megan Mans MA   Authorized by: Dub Mikes MD   Signed by: Megan Mans MA on 10/21/2006   Method used: Telephoned to ...     [facility]     [address]     [place], [place]  [address]     Ph: [phone]     Fax: [phone]    REQUEST FROM PHARMACY.

## 2006-11-16 NOTE — Unmapped (Signed)
Signed by Simeon Craft MD on 11/16/2006 at 07:44:23    Clinical Lists Changes    Medications:  Changed medication from LISINOPRIL 20 MG TABS (LISINOPRIL) 1 po QD to LISINOPRIL-HYDROCHLOROTHIAZIDE 20-25 MG TABS (LISINOPRIL-HYDROCHLOROTHIAZIDE) 1 by mouth daily - Signed  Rx of LISINOPRIL-HYDROCHLOROTHIAZIDE 20-25 MG TABS (LISINOPRIL-HYDROCHLOROTHIAZIDE) 1 by mouth daily;  #30 x 3;  Signed;  Entered by: Simeon Craft MD;  Authorized by: Simeon Craft MD;  Method used: Telephoned to

## 2006-11-24 NOTE — Unmapped (Signed)
Signed by Donia Guiles MA on 11/24/2006 at 14:03:46    Phone Note   Patient Call    Call back at Home Phone: [phone]  Call for: [facility]      Summary of Call:  PT REQUEST SAMPLES OF PROVIGIL TODAY IF POSSIBLE. PLEASE CALL HOME NUMBER.     Initial call taken by: Blanch Media,  November 24, 2006 1:18 PM      Follow-up for Phone Call   Spoke with pt and ifnormed her I would leave samples out at the front desk for her.   Phone Call Completed, Called Patient  Follow-up by: Megan Mans MA,  November 24, 2006 2:03 PM

## 2007-01-17 NOTE — Unmapped (Signed)
Signed by Donia Guiles MA on 01/17/2007 at 10:21:59    Phone Note   Patient Call    Call back at Home Phone: [phone]  Call for: [facility]      Summary of Call:  PT STATED THAT SHE NEEDS SAMPLS OF PROVIGIL-SHE IS COMPLETLY OUT. STATED THAT SHE WOULD LIKE FOR A FRIEND OF HERS PICK THEM UP-BRENDA STIVERS. PLEASE CALL HOME NUMBER.      Initial call taken by: Blanch Media,  January 17, 2007 10:17 AM      Follow-up for Phone Call   Spoke with pt and informed her I do have samples and will leave out at the front desk to pick up.   Phone Call Completed, Called Patient  Follow-up by: Megan Mans MA,  January 17, 2007 10:21 AM

## 2007-01-23 NOTE — Unmapped (Signed)
Signed by Donia Guiles MA on 01/23/2007 at 08:55:34    Phone Note   Patient Call    Call back at Home Phone: [phone]  Call for: [facility]      Summary of Call:  PT STATED THAT SHE WOULD LIKE TO KNOW WHEN THE LAST TIME SHE HAD AN IV TX WAS. STATED THAT SHE HAS A DRS APPT AT 10:30AM, AND WOULD LIKE THIS INFO FOR THAT APPT. PLEASE CALL HOME NUMBER, IF UNAVAIL PLEASE LEAVE INFO ON MACHINE.     Initial call taken by: Blanch Media,  January 23, 2007 8:49 AM      Follow-up for Phone Call   Spoke with pt and informed her according to records last solumedrol was Sept 2006. She was also transferred to scheduling to make f/u appt.   Phone Call Completed, Appt Scheduled, Called Patient  Follow-up by: Megan Mans MA,  January 23, 2007 8:55 AM

## 2007-01-26 NOTE — Unmapped (Signed)
Signed by Donia Guiles MA on 01/27/2007 at 95:62:13    Phone Note   Patient Call    Call back at Home Phone: [phone]  Caller: patient  Call for: [facility]      Summary of Call:  PT CALLING STATING SHE HAS AN APPT NEXT THURSDAY AND THINKS SHE WAS TOLD THE LAST TIME SHE WAS SEEN TO SCHEDULED AN MRI BEFORE SHE'S SEEN. PT STATING SHE'S GETTING READY TO GO TO BED SO DON'T CALL HER BACK TIL TOMORROW.      Initial call taken by: Marinell Blight,  January 26, 2007 11:30 AM      New Orders:  MRI, Brain w/ and w/o gadolinium [CPT-70559]  MRI, Spine, Cervical w/ & w/o gadolinium [CPT-[id number]]  Follow-up for Phone Call   Spoke with pt and gave her MRI appt date and time. Pt understood  Phone Call Completed, Called Patient  Follow-up by: Megan Mans MA,  January 27, 2007 8:46 AM

## 2007-01-31 NOTE — Unmapped (Signed)
Signed by   [employer] on 01/31/2007 at 13:29:16  Patient: Melanie [name]  Note: All result statuses are Final unless otherwise noted.    Tests: (1) MRI-C-SPINE W/O CONTRAST ([id number])    Order NotePricilla Handler Order Number: [id number]    Order Note:     *** VERIFIED [facility]  Reason:  340 MULTIPLE SCLEROSIS  Dict.Staff: Araceli Bouche [id number]    Verified By: Araceli Bouche        Ver: 01/31/07   1:31 pm  Exams:  MRI-C-SPINE W/O CONTRAST        Dictated 01/31/07 at 08:46    01/31/07  MRI OF THE CERVICAL SPINE:    INDICATION:  History of a mass.    The study is degraded secondary to patient motion.  The patient  could not tolerate repeat imaging.    FINDINGS:    There is normal alignment of the cervical spine.  No definite  intrinsic cord signal abnormality is evident.    At the C4-C5 disk space level, there is a mild diffuse disk  protrusion.  This partially effaces the ventral thecal sac  without cord impingement.    At the C5-C6 disk space level, there is a mild diffuse disk  protrusion.  This partially effaces the ventral thecal sac  without significant central stenosis or cord impingement.    The remainder of the disk space levels is unremarkable.    IMPRESSION:    1.  MILD DIFFUSE DISK PROTRUSIONS AT THE C4-C5 AND C5-C6 LEVELS.    THESE ARE ASSOCIATED WITH BROAD VENTRAL THECAL SAC IMPRESSIONS  WITHOUT SIGNIFICANT CENTRAL STENOSIS OR CORD IMPINGEMENT.    2.  MOTION DEGRADED STUDY.  THIS LIMITS EVALUATION FOR A SUBTLE  SIGNAL ABNORMALITY.  NO DEFINITE INTRINSIC CORD SIGNAL  ABNORMALITY IS EVIDENT.    Lajoyce Corners  **** end of result ****    Note: An exclamation mark (!) indicates a result that was not dispersed into   the flowsheet.  Document Creation Date: 01/31/2007 1:29 PM  _______________________________________________________________________    (1) Order result status: Final  Collection or observation date-time: 01/31/2007 08:00:00  Requested date-time: 01/31/2007 07:00:00  Receipt date-time:   Reported  date-time: 01/31/2007 13:31:34  Referring Physician: Dub Mikes  Ordering Physician: Dub Mikes [facility])  Specimen Source:   Source: QRS  Filler Order Number: [id number]  Lab site: [facility]

## 2007-02-08 NOTE — Unmapped (Signed)
Signed by Donia Guiles MA on 02/08/2007 at 17:37:48        [facility]   [address] [place], [place] [address]   [phone]  Fax: [phone]                     February 08, 2007              RE: Melanie [name]   DOB:  14-Feb-1956        Melanie [name] is currently under my care. Please excuse her from work on Monday February 11th and Tuesday February 12th. She may return to work on February 13th.      Sincerely,        Dub Mikes, MD

## 2007-02-08 NOTE — Unmapped (Signed)
Signed by Melanie Mikes MD on 02/21/2007 at 10:29:12    Multiple Sclerosis Visit      History of Present Illness- Multiple Sclerosis:   Chief Complaint: Follow Up MS  Handedness: right  HPI: I had the privilege of seeing Melanie [name]  for a follow-up appointment. She tells me that overall her condition has been stable since our last visit. She is on Rebif, which she tolerates well. She is still in shift work, and fatigue remains the most important concern. She takes 200 mg at 11PM, and it helps her until about 3-4AM, however, her shift ends at 7AM, and she feels very fatigued in the last 3-4 hours of her workdays.  She denies any new cognitive, vision, hearing, speech or swallowing-related problems; she also denies any new changes to her mobility, any sensory symptoms, or any new bowel or bladder symptoms. Her mood and her sleep have been normal and stable.     Past History  Past Medical History (reviewed - no changes required):  Hypertension  Surgical History (reviewed - no changes required):  Hysterectomy: Partial , Knee surgery  Appendix     Family History (reviewed - no changes required): Laryngeal cancer, stomach cancer, skin cancer, brain cancer,   Sister with diabetes   Social History (reviewed - no changes required): Marital Status: divorced,   Employment Status: employed full-time,   Occupation: Teacher, music  Patient Lives at: apartment,   Alcohol Use: occasionally  Tobacco Usage:non-smoker    Review of Systems   Refer to HPI for review of systems documentation.  General: Denies any specific issues at this time.   Eyes: Denies any specific issues at this time.   Ears/Nose/Throat: Denies any specific issues at this time.   Cardiovascular: Denies any specific issues at this time.   Respiratory: Denies any specific issues at this time.   Gastrointestinal: Denies any specific issues at this time.   Genitourinary: Denies any specific issues at this time.   Musculoskeletal: Denies any specific issues at this  time.   Skin: Denies any specific issues at this time.   Psychiatric: Denies any specific issues at this time.   Endocrine: Denies any specific issues at this time.   Heme/Lymphatic: Denies any specific issues at this time.   Allergic/Immunologic: Denies any specific issues at this time.       Intake-Neurology   Chief Complaint: Follow Up MS  Handedness: right    Vital Signs Weight: 243 pounds    BP #1: 144 / 96mm Hg Wt chg: -4    Allergies  ! COMPAZINE  Allergy and adverse reaction list reviewed during this update.    Meds Removed:  FLEXERIL 10 MG TABS (CYCLOBENZAPRINE HCL) 1 tab o q 8 hours prn spasm  TOPAMAX 25 MG TABS (TOPIRAMATE) 1 po QHS x 1 week, then 1 po BID x 1 week, then 1 po QAM and 2 po QHS x 1 week then 2 po BID  NAPROSYN 500 MG TABS (NAPROXEN) 1 tab by mouth every 12 hours    Intake recorded by: Megan Mans MA  February 08, 2007 4:39 PM          Physical Exam-Neurology   Appearance: well developed, well nourished and in no acute distress  Language/Speech: no aphasia and no dysarthria  Atten/concentration: awake and alert  Orientation: oriented x3  Memory: grossly intact  Fund of Knowledge: grossly intact  Ophthalmoscopic: no papilledema/good venous pulsations  Pupils: equal, round, reactive to light and accomodation  Visual Fields:   normal  CN 3,4,6 (EOM): extraocular movements intact, normal gaze alignment, and no nystagmus  Cranial Nerve 5 (Trigeminal): bilateral facial sensation normal  Cranial Nerve 7 (Facial): normal facial symmetry and strength  CN 8 (Auditory): proper hearing to finger rub bilaterally  Cranial Nerve 9 (Glossophar): soft palate elevates in the midline spontaneously  CN 11 (spinal access): full shoulder shrug equally  Cranial Nerve 12 (Hypoglossal): tongue is midline on protrusion    Muscle Strength   Muscle strength is 5/5 in all major muscle groups.    Muscle Tone Examination   Muscle tone is normal in all muscle groups of the upper and lower extremities.  Other Reflex  Testing: 2+    Sensory Examination   Normal light touch in both upper extremities; no visual/sensory extinction.      Coordination/ Station & Gait:   Coordination is normal to rapid alternating movements and finger to nose testing.  Station and gait are normal to regular, heel, toe and tandem walk. Romberg is (-).      Assessment    RRMS, with significant fatigue but no new symptoms.     Plan    We discussed different strategies to spread out her modafinil doses, such as 100-100 mg at 3AM and 2 hours later, or a potential increase by another 200 mg per day (or in her case, per night). We will also consider trying classic stimulants if this does not work well enough.  We discussed several features of multiple sclerosis, including, but not limited to the pathology of lesion formation, the connection between onset of symptoms and lesion formation, the basic MRI features of MS, natural history studies of MS, treatment of new MS attacks, preventive treatment of MS, symptomatic therapies, and life style modification issues.   We discussed the above recommendations in details and I answered all her questions. I encouraged her to call with questions or concerns at any time. I spent a total of  30+ minutes with her 90+% with counseling, so I consider this a highly complex follow-up appointment. F/U in 6 months.   Today's Orders   16109 - Ofc Vst, Est Level V [CPT-99215]    CC:   [name], MD            ]

## 2007-02-08 NOTE — Unmapped (Signed)
Signed by Donia Guiles MA on 02/08/2007 at 17:35:56    Clinical Lists Changes    Problems:  Added new problem of AFTERCARE, LONG-TERM USE, MEDICATIONS NEC (ICD-V58.69)  Orders:  Added new Test order of CBC + plat + Diff  (1610) ([id number]) - Signed  Added new Test order of Liver function panel  (LIVP) (CPT-80076) - Signed  Added new Test order of TSH   (TSH) (899) ([id number]) - Signed

## 2007-02-17 NOTE — Unmapped (Signed)
Signed by Donia Guiles MA on 02/17/2007 at 15:15:36    Phone Note   Patient Call    [facility] Cell Phone #: [phone]  Call for: [facility]      Summary of Call:  PT STATED THAT SHE WANTS TO PICK UP SAMPLES OF PROVIGIL TODAY B/F OFC CLOSES. STATED THAT SHE WOULD BE COMMING HERE IN ABOUT 10-15 MINS. ADVISED PT THAT MIGHT NOT BE ENOUGH TIME FOR NURSE.     Initial call taken by: Blanch Media,  February 17, 2007 3:11 PM      Follow-up for Phone Call   Spoke with pt and informed her I do not have any samples at this time. She could try back end of next week to see if they come in by then.   Phone Call Completed, Called Patient  Follow-up by: Megan Mans MA,  February 17, 2007 3:15 PM

## 2007-02-20 NOTE — Unmapped (Addendum)
Signed by Donia Guiles MA on 02/20/2007 at 13:24:13    Phone Note   Call from Pharmacy  Pharmacy Name: [facility]  Caller: [facility]  Pharmacy Phone Number: [phone]  Summary of call: P.A. FOR PROVIGIL.  INS# [phone].  ID# [id number].  Initial call taken by: Blanch Media,  February 20, 2007 11:26 AM    Follow-up for Phone Call   Left message for Nena Polio at [phone] to call me back regarding precert for Provigil.   Phone Call Completed  Follow-up by: Megan Mans MA,  February 20, 2007 1:23 PM      Signed by Donia Guiles MA on 02/20/2007 at 13:59:43    Gershon Mussel called back and did PA. She called pharmacy and notified them.

## 2007-02-23 NOTE — Unmapped (Signed)
Signed by Donia Guiles MA on 02/23/2007 at 09:43:37    [facility]            [facility] ??? Teaching ??? Leading     [address]  [place], [place] [address]  Ph:   [phone]  Fax: [phone]            February 23, 2007              RE: IKEISHA BLUMBERG   DOB:  1956/03/16    Dear Dr. Vanita Panda:    I had the pleasure of seeing your patient, AKYLA VAVREK, in consultation in the [facility] on 02/20/2007.     History of Present Illness:  I had the privilege of seeing Mrs Mackel  for a follow-up appointment. She tells me that overall her condition has been stable since our last visit. She is on Rebif, which she tolerates well. She is still in shift work, and fatigue remains the most important concern. She takes 200 mg at 11PM, and it helps her until about 3-4AM, however, her shift ends at 7AM, and she feels very fatigued in the last 3-4 hours of her workdays.  She denies any new cognitive, vision, hearing, speech or swallowing-related problems; she also denies any new changes to her mobility, any sensory symptoms, or any new bowel or bladder symptoms. Her mood and her sleep have been normal and stable.      Impression:    RRMS, with significant fatigue but no new symptoms.      Plan:  We discussed different strategies to spread out her modafinil doses, such as 100-100 mg at 3AM and 2 hours later, or a potential increase by another 200 mg per day (or in her case, per night). We will also consider trying classic stimulants if this does not work well enough.  We discussed several features of multiple sclerosis, including, but not limited to the pathology of lesion formation, the connection between onset of symptoms and lesion formation, the basic MRI features of MS, natural history studies of MS, treatment of new MS attacks, preventive treatment of MS, symptomatic therapies, and life style modification issues.   We discussed the above recommendations in  details and I answered all her questions. I encouraged her to call with questions or concerns at any time. I spent a total of  30+ minutes with her 90+% with counseling, so I consider this a highly complex follow-up appointment. F/U in 6 months.      Thank you for allowing me to participate in the care of this pleasant patient.  If you would like a copy of my office note, please contact our Medical Records department at [phone].  Please feel free to contact me should you have any concerns or questions.    Sincerely,        Dub Mikes, MD

## 2007-02-23 NOTE — Unmapped (Signed)
Signed by Simeon Craft MD on 02/23/2007 at 00:00:00  Neurology      Imported By: Pearla Dubonnet 03/07/2007 09:48:15    _____________________________________________________________________    External Attachment:    Please see [employer] EMR for this document.

## 2007-03-23 NOTE — Unmapped (Signed)
Signed by Donia Guiles MA on 03/23/2007 at 13:10:38    Phone Note   Patient Call    Call back at Home Phone: [phone]  Call for: [facility]      Summary of Call:  PT CALLING TO CHECK IF ANY PROVIGIL SAMPLES ARE AVAIL. STATED THAT SHE WOULD LIKE TO PICK THEM UP TODAY. PLEASE CALL HOME NUMBER.     Initial call taken by: Blanch Media,  March 23, 2007 12:52 PM      Follow-up for Phone Call   Spoke with pt and informed her I would a couple boxes of Provigil at the front desk for her to pick up.   Phone Call Completed, Called Patient  Follow-up by: Megan Mans MA,  March 23, 2007 1:10 PM

## 2007-04-27 NOTE — Unmapped (Signed)
Signed by Donia Guiles MA on 04/27/2007 at 13:11:19    Phone Note   Patient Call    Call back at Home Phone: [phone]  Caller's Cell Phone #: [phone]   Caller: patient  Department: Neurology  Call for: [facility]      Summary of Call:  WANTING TO KNOW IF YOU HAVE SAMPLES OF PROVIGIL  WILL BE IN AREA ABOUT 1:30PM      Initial call taken by: Anders Simmonds,  Apr 27, 2007 12:47 PM      Follow-up for Phone Call   Spoke with pt and informed her I would leave a couple boxes of Provigil at the front desk for her. She will come and pick up.   Phone Call Completed, Called Patient  Follow-up by: Megan Mans MA,  Apr 27, 2007 1:11 PM

## 2007-05-12 NOTE — Unmapped (Signed)
Signed by Donia Guiles MA on 05/12/2007 at 09:54:50    Phone Note   Patient Call    Caller: patient  Call for: [facility]      Summary of Call:  PT CALLING TO SEE IF WE HAVE ANY PROVIGIL SAMPLES. PT IS AT WORK AND DOESN'T HAVE ANY. PT DOESN'T HAVE A RETURN PH# SO SHE'S COMING ON OVER TO SEE IF WE HAVE ANY.      Initial call taken by: Marinell Blight,  May 12, 2007 9:08 AM      Follow-up for Phone Call   Left sampls at front desk for pt to pick up  Phone Call Completed  Follow-up by: Megan Mans MA,  May 12, 2007 9:54 AM

## 2007-05-16 NOTE — Unmapped (Signed)
Signed by Katheran James MA on 05/16/2007 at 13:24:48    Prescriptions:  LISINOPRIL-HYDROCHLOROTHIAZIDE 20-25 MG TABS (LISINOPRIL-HYDROCHLOROTHIAZIDE) 1 by mouth daily  #30 x 0   Entered by: Katheran James MA   Authorized by: Simeon Craft MD   Signed by: Katheran James MA on 05/16/2007   Method used: Handwritten   RxID: [id number]

## 2007-05-29 NOTE — Unmapped (Signed)
Signed by Donia Guiles MA on 05/29/2007 at 13:03:37    Phone Note   Patient Call    Call back at Home Phone: [phone]  Caller's Cell Phone #: [phone]  Caller: patient  Call for: DR. Kadden Osterhout     Reason for call: PT WOULD LIKE TO GET SAMPLE OF PROVIGIL       Initial call taken by: Hendricks Milo,  May 29, 2007 12:58 PM      Follow-up for Phone Call   Spoke with pt and informed her I do not have any samples at this time. She is welcome to call back at the end of the week or early next week to see if samples have come in.   Phone Call Completed, Called Patient  Follow-up by: Megan Mans MA,  May 29, 2007 1:03 PM

## 2007-05-30 NOTE — Unmapped (Signed)
Signed by Donia Guiles MA on 05/30/2007 at 09:52:38    Prescriptions:  PROVIGIL 200 MG TABS (MODAFINIL) 1 po bid  #60 x 5   Entered by: Megan Mans MA   Authorized by: Dub Mikes MD   Signed by: Megan Mans MA on 05/30/2007   Method used: Telephoned to ...     [facility]     [address]     [place], [place]  [address]     Ph: [phone]     Fax: [phone]   RxID: [id number]

## 2007-06-19 NOTE — Unmapped (Signed)
Signed by Katheran James MA on 06/19/2007 at 17:10:36    Prescriptions:  LISINOPRIL-HYDROCHLOROTHIAZIDE 20-25 MG TABS (LISINOPRIL-HYDROCHLOROTHIAZIDE) 1 by mouth daily  #30 x 3   Entered by: Katheran James MA   Authorized by: Simeon Craft MD   Signed by: Katheran James MA on 06/19/2007   Method used: Handwritten   RxID: [id number]

## 2007-07-28 NOTE — Unmapped (Signed)
Signed by Dub Mikes MD on 07/28/2007 at 00:00:00  External Other      Imported By: Heath Gold 08/02/2007 12:10:31    _____________________________________________________________________    External Attachment:    Please see [facility] EMR for this document.

## 2007-08-10 NOTE — Unmapped (Signed)
Signed by Donia Guiles MA on 08/10/2007 at 16:18:41    Prescriptions:  REBIF 44 MCG/0.5ML SOLN (INTERFERON BETA-1A) 1 injection TIW  #12 x 6   Entered by: Georgeanna Harrison Mock   Authorized by: Dub Mikes MD   Signed by: Megan Mans MA on 08/10/2007   Method used: Telephoned to ...     [facility]     [address]     [place], [place]  [address]     Ph: [phone]     Fax: [phone]   RxID: [id number]    Sent orders to pt to have blood work done. DLW 08-10-07 @ 4:18 pm

## 2007-10-06 NOTE — Unmapped (Signed)
Signed by Simeon Craft MD on 10/06/2007 at 13:16:46    Phone Note   Patient Call  Call back at Home Phone: [phone]  Caller's Cell Phone #: [phone]  Caller: patient  Call for: AD    Summary of Call: Pt. eyes are burning, sinus draining, throat, and chest hurt.   Voice fades in and out.  Pt. wants an appt. today.        Initial call taken by: Claybon Jabs,  October 06, 2007 12:23 PM      Follow-up for Phone Call   come now.  Follow-up by: Simeon Craft MD,  October 06, 2007 12:42 PM    Additional Follow-up for Phone Call   Called, pt. says she will here in about 30 minutes.    Additional Follow-up by: Claybon Jabs,  October 06, 2007 12:49 PM

## 2007-10-06 NOTE — Unmapped (Signed)
Signed by Simeon Craft MD on 10/06/2007 at 14:26:09      Reason for Visit   Chief Complaint: burning in eyes and sinus drainage and miagraine, bodyaches, low back pain    History from: patient    Allergies  ! COMPAZINE  Allergy and adverse reaction list reviewed during this update.      Medications   PROVIGIL 200 MG TABS (MODAFINIL) 1 po bid  REBIF 44 MCG/0.5ML SOLN (INTERFERON BETA-1A) 1 injection TIW  LISINOPRIL-HYDROCHLOROTHIAZIDE 20-25 MG TABS (LISINOPRIL-HYDROCHLOROTHIAZIDE) 1 by mouth daily  VICODIN 5-500 MG TABS (HYDROCODONE-ACETAMINOPHEN) 1 tab by mouth every 6 hours as needed pain        Vital Signs:   Ht: 64 in.  Wt: 245 lbs.      BMI: 42.21  BSA: 2.13  Wt chg (lbs): 2  Temperature: 97.7  degrees F  oral    Patient appears to be in acute distress: no  BP: 138/90    Intake recorded by: Josefa Half MA on October 06, 2007 2:08 PM      History of Present Illness: Began last week with Headache, head congestion, hoarseness. Body achy, low back, eyes burning. Cough just started. getting works.No N/V/D. Feverish. no dysuria, chills.     Review of Systems       Physical Examination:   BP: 138/  90    Physical Exam- Detail:   General Appearance: well-developed, well-nourished and in no acute distress.  Ears: No lesions.  Tympanic membranes translucent, non-bulging.  Canal walls pink, without discharge.  Hearing grossly intact.  Nose/Face: tender maxillary sinuses  Oropharynx: Normal appearance.  No erythema,exudate, or mass. No tonsillar swelling.  Respiratory: Respiration un-labored.  Lung fields clear to auscultation.  No wheezing, rales, rhonchi or pleural rub.  Neck: Full ROM.  No thyromegaly, no nodules, masses, or tenderness.   Cardiac: S1 and S2 normal.  RRR without murmurs, rubs, gallops.  No JVD.        Follow-up for Test Results:     New Problems:  UPPER RESPIRATORY INFECTION, VIRAL (ICD-465.9)  OTHER ACUTE SINUSITIS (ICD-461.8)  New Medications:  AUGMENTIN 875-125 MG  TABS (AMOXICILLIN-POT CLAVULANATE)  1 tab by mouth bid           Coordinating Care Providers   PCP Name: [name], MD    Assessment and Plan  New Problems:  Dx of UPPER RESPIRATORY INFECTION, VIRAL (ICD-465.9)  Onset: 10/06/2007  Dx of OTHER ACUTE SINUSITIS (ICD-461.8)  Onset: 10/06/2007    Medications   New Prescriptions/Refills:  AUGMENTIN 875-125 MG  TABS (AMOXICILLIN-POT CLAVULANATE) 1 tab by mouth bid  #20 x 0, 10/06/2007, Simeon Craft MD    New medications:  AUGMENTIN 875-125 MG  TABS -- 1 tab by mouth bid  Start date: 10/06/2007      Today's Orders   99213 - Ofc Vst, Est Level III [[id number]]              Prescriptions:  AUGMENTIN 875-125 MG  TABS (AMOXICILLIN-POT CLAVULANATE) 1 tab by mouth bid  #20 x 0   Entered and Authorized by: Simeon Craft MD   Signed by: Simeon Craft MD on 10/06/2007   Method used: Print then Give to Patient   RxID: [id number]                ]

## 2007-10-06 NOTE — Unmapped (Signed)
Signed by Simeon Craft MD on 10/06/2007 at 14:19:41      Work Excuse   This patient was seen in the office 10/06/2007.  May not return to work until 10/09/2007.  Comments: Please excuse her abscenc due to illness

## 2007-11-20 NOTE — Unmapped (Signed)
Signed by Donia Guiles MA on 11/20/2007 at 15:45:58    Phone Note   Patient Call    Call for: Melanie [name]      Summary of Call:  patient states she just realized today that she has no provigil left.  Wants to get samples.  States there is someone at the office right now who can pick them up for her.     Initial call taken by: Severiano Gilbert,  November 20, 2007 3:32 PM      Follow-up for Phone Call   LEft samples at the front desk.   Phone Call Completed  Follow-up by: Megan Mans MA,  November 20, 2007 3:45 PM

## 2007-11-28 NOTE — Unmapped (Signed)
Signed by Donia Guiles MA on 11/29/2007 at 10:28:12    Phone Note   Patient Call    Call back at Home Phone: [phone]  Caller's Cell Phone #: [phone]  Caller: patient  Call for: [facility]      Summary of Call:  Pt calling to speak with the nurse stating her children would like to speak with Dr. Cherylann Banas regarding her illness.      Initial call taken by: Marinell Blight,  November 28, 2007 2:42 PM      Follow-up for Phone Call   Spoke with pt. She has not been seen since Feb and has been having increased fatigue etc. her children are worried and have questions. Advised pt she would need to be seen since it's almost been a year and her children have questions. Could not do this over the phone. Pt was set up for 12-25-07 @ 2:45. She understood. Apparently her job has found out she has MS and she has been being harrassed.   Phone Call Completed, Called Patient  Follow-up by: Megan Mans MA,  November 29, 2007 10:28 AM

## 2007-12-12 NOTE — Unmapped (Signed)
Signed by Donia Guiles MA on 12/13/2007 at 15:01:00    Phone Note   Patient Call    Call back at Home Phone: [phone]  Caller's Cell Phone #: [phone] cell  Call for: [name]      Summary of Call:  patient requesting a call from nurse right away. she has an appointment at the end of the month.  She wants to know if she get take off from work before that?     Initial call taken by: Severiano Gilbert,  December 12, 2007 2:43 PM      New Orders:  Other, Referral [phone]  Follow-up for Phone Call   Spoke with pt and informed her I would need to speak with Dr. Cherylann Banas about this tomorrow when he is in clinic. She understood.   Called Patient  Follow-up by: Megan Mans MA,  December 12, 2007 4:04 PM    Additional Follow-up for Phone Call   Spoke with pt and informed her we would give her off work until she came in for appt with Dr. Cherylann Banas on 12-25-07. Will give pt a note for this.   Phone Call Completed, Called Patient  Additional Follow-up by: Megan Mans MA,  December 13, 2007 3:01 PM

## 2007-12-13 NOTE — Unmapped (Signed)
Signed by Donia Guiles MA on 12/13/2007 at 15:00:30        [facility]   [address] [place], [place] [address]   [phone]  Fax: [phone]                       December 13, 2007              RE: DEEANNE DEININGER   DOB:  1956/09/10      To Whom It May Concern:    Please excuse Amrutha from work December 13, 2007 to December 25, 2007. May return December 26, 2007. Please contact my office if you have any questions. Thank you.           Sincerely,      Dub Mikes, MD

## 2007-12-14 NOTE — Unmapped (Signed)
Signed by   LinkLogic on 12/14/2007 at 16:13:01    Appointment status changed to no show by  [employer] on 12/14/2007 4:13 PM.    No Show Comments  ----------------  (ARA) SICK VISIT    Appointment Information  -----------------------  Appt Type:         Date:  Thursday, December 14, 2007       Time:  3:45 PM for 15 min    Urgency:  Routine    Made By:  Sherri Sear   To Visit:  Trenity Pha Judie Petit MD     Reason:  Adrian Prince) SICK VISIT    Appt Comments  -------------  -- 12/14/07 16:13: (PERRYG) NO SHOW --  (ARA) SICK VISIT  MIGRAIN,OTHER ISSUES UNDISCLOSED    -- 12/13/07 13:54: (PERRYG) BOOKED --  Routine  at 12/14/2007 3:45 PM for 15 min  (ARA) SICK VISIT  MIGRAIN,OTHER ISSUES UNDISCLOSED

## 2007-12-15 NOTE — Unmapped (Signed)
Signed by Donia Guiles MA on 12/18/2007 at 11:18:26    Phone Note   Patient Call    Caller's Cell Phone #: [phone] cell   Call for: Eulice Rutledge      Summary of Call:  patient requesting for nurse to call back re: note to be off work.  States that the date needs to be readjusted on the letter.         Follow-up for Phone Call   Left voice message for pt informing her I could not adjust the dates I have listed on her note. The date of the 17th is when I got permission from Dr. Cherylann Banas to give pt off and the 29th is her appt and will be evaluated at that time. Asked pt to call back if she had any questions.   Phone Call Completed, Called Patient  Follow-up by: Megan Mans MA,  December 18, 2007 11:18 AM

## 2007-12-19 NOTE — Unmapped (Signed)
Signed by Dub Mikes MD on 12/19/2007 at 00:00:00  [employer]      Imported By: Maryellen Pile 12/26/2007 16:12:54    _____________________________________________________________________    External Attachment:    Please see [employer] EMR for this document.

## 2007-12-19 NOTE — Unmapped (Signed)
Signed by Donia Guiles MA on 12/19/2007 at 11:37:25    Phone Note   Patient Call    Call back at Home Phone: [phone]  Caller's Cell Phone #: [phone]  Caller: patient  Call for: Takela Varden    Reason for call: SAID IT IS RE HER BEING OFF FROM WORK, WOULD NOT LEAV ANY OTHER DETAILS      Initial call taken by: Kennedy Bucker,  December 19, 2007 9:53 AM      New Orders:  Physical Therapy Evaluation [CPT-97001]  Follow-up for Phone Call   Spoke with pt. She needs her work excuse to state from 12-15-07 to 12-25-07 that she will be off. Also faxed over orders to [facility] for PT.   Phone Call Completed, Called Patient  Follow-up by: Megan Mans MA,  December 19, 2007 11:37 AM

## 2007-12-19 NOTE — Unmapped (Signed)
Signed by Donia Guiles MA on 12/19/2007 at 11:35:27        [facility]   [address] [place], [place] [address]   [phone]  Fax: [phone]                       December 19, 2007              RE: DESTANE SPEAS   DOB:  16-Nov-1956      Please excuse Ziona from work December 19th to December 29th. She will return on December 30th, 2008. Please contact my office if you have any questions. Thank you.         Sincerely,      Dub Mikes, MD

## 2007-12-25 NOTE — Unmapped (Signed)
Signed by Ebony Cargo MA on 12/25/2007 at 08:37:16    Prescriptions:  LISINOPRIL-HYDROCHLOROTHIAZIDE 20-25 MG TABS (LISINOPRIL-HYDROCHLOROTHIAZIDE) 1 by mouth daily  #30 x 3   Entered by: Ebony Cargo MA   Authorized by: Simeon Craft MD   Signed by: Ebony Cargo MA on 12/25/2007   Method used: Historical   RxID: [id number]

## 2007-12-25 NOTE — Unmapped (Signed)
Signed by Simeon Craft MD on 12/25/2007 at 17:45:08      Reason for Visit   Chief Complaint: pt here today c/o migraines blurred vision under a lot of stress    History from: patient    Allergies  ! COMPAZINE    Medications     Vital Signs:   Wt: 244 lbs.      BMI: 42.03  BSA: 2.13  Wt chg (lbs): -1  Temperature: 98.2  degrees F  oral    Patient appears to be in acute distress: no  BP: 125/90    Intake recorded by: Ebony Cargo MA on December 25, 2007 4:45 PM    History of Present Illness   Chief Complaint: Stress causing HA's  Headache, stressors and her MS. She's been having issues with Headaches, blurred vision from her MS and says she's being harrassed at work for her MS. She was to see Dr. Cherylann Banas to discuss thses today but states she arrived late due to an accident. She has been talking with a counselor in Employee health and she told her to maybe see a psychiatrist. Pt said Dr. Cherylann Banas gave her work note till appt today but doesn't feel like she's ready to go back. I asked what she does for stress relief she watches movies. She signed up for water therapy but they were colosed due to the holidays. She's not slept well and admits to drinking caffeine. She sttes the visual changes have been going on for awhile. She has appt with Dr. Cherylann Banas for Jan 7th      Review of Systems       Physical Examination:   BP: 125/  90  Psychiatric: Judgement and insight are within normal limits.  Alert and oriented x3.  No mood disorders noted, appropriate affect.           Follow-up for Test Results:     New Problems:  STRESS DISORDER (ICD-V62.89)  HEADACHE (ICD-784.0)  New Medications:  IBUPROFEN 800 MG  TABS (IBUPROFEN) 1 tab by mouth every 8 hours as needed Headache        Coordinating Care Providers   PCP Name: [name], MD            Prescriptions:  IBUPROFEN 800 MG  TABS (IBUPROFEN) 1 tab by mouth every 8 hours as needed Headache  #30 x 0   Entered and Authorized by: Simeon Craft MD   Signed by: Simeon Craft MD on 12/25/2007   Method used: Print then Give to Patient   RxID: [id number]      Assessment and Plan  New Problems:  Dx of STRESS DISORDER (ICD-V62.89)  Onset: 12/25/2007  Dx of HEADACHE (ICD-784.0)  Onset: 12/25/2007    Medications   New Prescriptions/Refills:  IBUPROFEN 800 MG  TABS (IBUPROFEN) 1 tab by mouth every 8 hours as needed Headache  #30 x 0, 12/25/2007, Simeon Craft MD    New medications:  IBUPROFEN 800 MG  TABS -- 1 tab by mouth every 8 hours as needed Headache  Start date: 12/25/2007    Assessment and Plan Comments   Rec pt call [facility]. Also An extension of her work note given till she sees Dr. Cherylann Banas.  Patient Instructions   drink more fluids-decrease caffeine, f/u with neuro    Today's Orders   99213 - Ofc Vst, Est Level III [[id number]]

## 2007-12-25 NOTE — Unmapped (Signed)
Signed by Simeon Craft MD on 12/25/2007 at 17:15:09      Work Excuse   This patient was seen in the office 12/25/2007.  May not return to work until 01/04/2008.

## 2007-12-28 ENCOUNTER — Inpatient Hospital Stay

## 2008-01-03 NOTE — Unmapped (Signed)
Signed by Donia Guiles MA on 01/03/2008 at 13:31:32    Clinical Lists Changes    Orders:  Added new Test order of MRI, Brain w/ and w/o gadolinium ([id number]) - Signed  Added new Test order of MRI, Spine, Cervical w/ & w/o gadolinium ([id number]) - Signed

## 2008-01-03 NOTE — Unmapped (Signed)
Signed by Donia Guiles MA on 01/03/2008 at 13:31:02        [facility]   [address] [place], [place] [address]   [phone]  Fax: [phone]                       January 03, 2008              RE: Melanie [name]   DOB:  10/14/1956      [name] is currently under my care. Please excuse her from work 01-03-08 to 01-27-08. She may return January 29, 2008. Please contact my office should you have any questions. Thank you.          Sincerely,      Dub Mikes, MD

## 2008-01-03 NOTE — Unmapped (Signed)
Signed by Dub Mikes MD on 01/03/2008 at 11:29:55    Multiple Sclerosis Visit      History of Present Illness- Multiple Sclerosis:   Chief Complaint: Follow Up Visit MS  Handedness: right  HPI: I had the privilege of seeing Melanie [name]  for a follow-up appointment. Prior to the appointment I reviewed several recent phone notes and chart entries.   She has been having a very hard time at work over the past few months. It sounds like she had at least 3 new bosses over this time frame, and she has gotten into situations that were not under her control, but she was blamed for; they wronged her and never apologized to her even when she was right, etc. She obviously has been under a lot of stress. She admits to quite a bit of fatigue, with the modafinil being helpful to some extent, but not as well as before. She currently is in the process of seeing a psychologist and a psychiatrist, and she still does not feel ready to go back to work until she can at least have the first appointments with them. Unfortunately, during the holidays she had a very hard time finding an available appointment, which has resulted in some delay. She is very clear on the fact that she does want to go back to work - she likes her job, she feels she is doing a good job at work, and she knows what she is doing; but she needs to find a way to reconcile the current situation she is in. There are some positive steps taken by HR and others to resolve this, but I agree with her and her other physicians that psychiatry and psychology are also important steps in this process.  She mentioned that at the peak of her stress, she developed a lot more fatigue, had bowel and bladder incontinence, muscle weakness mainly on the left side of the body, etc. 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   She denies any new cognitive, vision, hearing, speech or  swallowing-related problems; she also denies any new changes to her mobility. She does feel off balance at times, but she does not feel this is much worse. Since she has been off, she has slept at normal times (she works 3rd shift), and she feels her  energy level is better this way. She still uses 200 mg of modafinil per day, which is less than what she had been using while doing 3rd shift work.  She has also been having HA-s since these issues at work have been going on. She describes a constant pressure-like HA across the entire cranium, with occasional worsening including a throbbing/pounding component, during which time she would also have photo-and phonophobia, and some nausea. Every day she has the pressure-like HA, starting usally within 30 mins after waking up in the morning. Tylenol provides transient relief of the pressure-type component.    Past History  Past Medical History (reviewed - no changes required):  Hypertension  Surgical History (reviewed - no changes required):  Hysterectomy: Partial , Knee surgery  Appendix     Family History (reviewed - no changes required): Laryngeal cancer, stomach cancer, skin cancer, brain cancer,   Sister with diabetes   Social History (reviewed - no changes required): Marital Status: divorced,   Employment Status: employed full-time,   Occupation: Teacher, music  Patient Lives at: apartment,   Alcohol Use:  occasionally  Tobacco Usage:non-smoker    Review of Systems   Refer to HPI for review of systems documentation.  General: Denies any specific issues at this time.   Eyes: Denies any specific issues at this time.   Ears/Nose/Throat: Denies any specific issues at this time.   Cardiovascular: Denies any specific issues at this time.   Respiratory: Denies any specific issues at this time.   Gastrointestinal: Denies any specific issues at this time.   Genitourinary: Denies any specific issues at this time.   Musculoskeletal: Denies any specific issues at this time.    Skin: Denies any specific issues at this time.   Psychiatric: Denies any specific issues at this time.   Endocrine: Denies any specific issues at this time.   Heme/Lymphatic: Denies any specific issues at this time.   Allergic/Immunologic: Denies any specific issues at this time.       Intake-Neurology   Chief Complaint: Follow Up Visit MS  Handedness: right    Vital Signs   Height: 64 inches  Weight: 243 pounds    BP #1: 136 / 96mm Hg   BMI: 41.86  BSA: 2.13  Wt chg: -1    Allergies  ! COMPAZINE  Allergy and adverse reaction list reviewed during this update.    New Medication:  TOPAMAX 25 MG  TABS (TOPIRAMATE) 2 by mouth twice a day  Meds Removed:  VICODIN 5-500 MG TABS (HYDROCODONE-ACETAMINOPHEN) 1 tab by mouth every 6 hours as needed pain  AUGMENTIN 875-125 MG  TABS (AMOXICILLIN-POT CLAVULANATE) 1 tab by mouth bid    Intake recorded by: Megan Mans MA  January 03, 2008 9:54 AM          Physical Exam-Neurology   Appearance: well-developed, well-nourished and in no acute distress.  Language/Speech: no aphasia and no dysarthria  Atten/concentration: awake and alert  Orientation: oriented x3  Memory: grossly intact  Fund of Knowledge: grossly intact  Pupils: equal, round, reactive to light and accomodation  Visual Fields:   normal  CN 3,4,6 (EOM): extraocular movements intact, normal gaze alignment, and no nystagmus  Cranial Nerve 5 (Trigeminal): bilateral facial sensation normal  Cranial Nerve 7 (Facial): normal facial symmetry and strength  CN 8 (Auditory): proper hearing to finger rub bilaterally  Cranial Nerve 9 (Glossophar): soft palate elevates in the midline spontaneously  CN 11 (spinal access): full shoulder shrug equally  Cranial Nerve 12 (Hypoglossal): tongue is midline on protrusion  Further Strength Testing 5- in L LE distally. Muscle aches and pains limits the validity of the exam.  Other Reflex Testing: 2+ throughout, equivocal left toe, downgoing right toe.    Sensory Examination   Normal light  touch in both upper extremities; no visual/sensory extinction.      Coordination/ Station & Gait:   Rapid alternating movements (right): normal  Finger to nose (right):normal  Heel to shin (right): normal  Finger Tapping (right): normal  Rapid alternating movements (left):-1  Finger to nose (left):-1  Heel to shin (left): normal  Finger Tapping (left): normal  Romberg: positive  Station/Gait: ataxic  Further Cerebellar/Gait testing: mild gait ataxia, steps out 8/10, able to doe toes better than heels, both are worse on L        Medications   New Prescriptions/Refills:  TOPAMAX 25 MG  TABS (TOPIRAMATE) 2 by mouth twice a day  #42 x 0, 01/03/2008, Danielle L Wefer MA    New medications:  TOPAMAX 25 MG  TABS -- 2 by mouth  twice a day  Start date: 01/03/2008    Assessment    RRMS, with significant fatigue   Gait ataxia  Mild weakness  Stress-induced symptoms   Tension HA with occasional migranous component     Plan    We will restart her topiramate for HA control, it may also help her lose some weight. We discussed the side effect profile, and she did use this before, so she is familiar with the medication. I explained that overuse of Tylenol or Motrin is contraindicated, as it can lead to analgesic rebound HA-s which are very hard to treat. Obviously, the excessive stress she has been under undoubtedly also contribute to her HA-s, and I hope that as her work-related situation resolves, this should get better, too.  I also would like to have new MRI-s to assess her disease activity at the tissue level. This will also be done while she is off in January. I agreed to keep her off while all the psychology and pscyhiatry appointments take place, and while we clarify if modifications are needed to her DMA-s.   We discussed the above recommendations in details and I answered all her questions. I encouraged her to call with questions or concerns at any time. I spent a total of  40 minutes with her, over 50% with counsleing, so  I consider this a highly complex follow-up appointment.    Today's Orders   99215 - Ofc Vst, Est Level V [CPT-99215]      Prescriptions:  TOPAMAX 25 MG  TABS (TOPIRAMATE) 2 by mouth twice a day  #42 x 0   Entered by: Megan Mans MA   Authorized by: Dub Mikes MD   Signed by: Megan Mans MA on 01/03/2008   Method used: Print then Give to Patient   RxID: [id number]            ]

## 2008-01-05 NOTE — Unmapped (Signed)
Signed by Donia Guiles MA on 01/05/2008 at 11:00:16      Coordinating Care Providers   PCP Name: [name], MD  PCP Address: [address] [place], [place] [address]

## 2008-01-05 NOTE — Unmapped (Signed)
Signed by Donia Guiles MA on 01/05/2008 at 11:04:44    [facility]            [facility] ??? [facility] ??? Leading     [address]  [place], [place] [address]  Ph:   [phone]  Fax: [phone]            January 05, 2008              RE: GRAVIELA NODAL   DOB:  August 16, 1956    Dear Dr. Vanita Panda,    I had the pleasure of seeing your patient, TATANISHA CUTHBERT, in consultation in the [facility] on 01/03/2008.     History of Present Illness:  I had the privilege of seeing Mrs Declercq  for a follow-up appointment. Prior to the appointment I reviewed several recent phone notes and chart entries.   She has been having a very hard time at work over the past few months. It sounds like she had at least 3 new bosses over this time frame, and she has gotten into situations that were not under her control, but she was blamed for; they wronged her and never apologized to her even when she was right, etc. She obviously has been under a lot of stress. She admits to quite a bit of fatigue, with the modafinil being helpful to some extent, but not as well as before. She currently is in the process of seeing a psychologist and a psychiatrist, and she still does not feel ready to go back to work until she can at least have the first appointments with them. Unfortunately, during the holidays she had a very hard time finding an available appointment, which has resulted in some delay. She is very clear on the fact that she does want to go back to work - she likes her job, she feels she is doing a good job at work, and she knows what she is doing; but she needs to find a way to reconcile the current situation she is in. There are some positive steps taken by HR and others to resolve this, but I agree with her and her other physicians that psychiatry and psychology are also important steps in this process.  She mentioned that at the peak of her stress, she developed a lot more  fatigue, had bowel and bladder incontinence, muscle weakness mainly on the left side of the body, etc. 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   She denies any new cognitive, vision, hearing, speech or swallowing-related problems; she also denies any new changes to her mobility. She does feel off balance at times, but she does not feel this is much worse. Since she has been off, she has slept at normal times (she works 3rd shift), and she feels her energy level is better this way. She still uses 200 mg of modafinil per day, which is less than what she had been using while doing 3rd shift work.  She has also been having HA-s since these issues at work have been going on. She describes a constant pressure-like HA across the entire cranium, with occasional worsening including a throbbing/pounding component, during which time she would also have photo-and phonophobia, and some nausea. Every day she has the pressure-like HA, starting usally within 30 mins after waking up in the morning. Tylenol provides  transient relief of the pressure-type component.     Impression:    RRMS, with significant fatigue   Gait ataxia  Mild weakness  Stress-induced symptoms   Tension HA with occasional migranous component      Plan:  We will restart her topiramate for HA control, it may also help her lose some weight. We discussed the side effect profile, and she did use this before, so she is familiar with the medication. I explained that overuse of Tylenol or Motrin is contraindicated, as it can lead to analgesic rebound HA-s which are very hard to treat. Obviously, the excessive stress she has been under undoubtedly also contribute to her HA-s, and I hope that as her work-related situation resolves, this should get better, too.  I also would like to have new MRI-s to assess her disease activity at the tissue level. This will also be  done while she is off in January. I agreed to keep her off while all the psychology and pscyhiatry appointments take place, and while we clarify if modifications are needed to her DMA-s.   We discussed the above recommendations in details and I answered all her questions. I encouraged her to call with questions or concerns at any time. I spent a total of  40 minutes with her, over 50% with counsleing, so I consider this a highly complex follow-up appointment.       Thank you for allowing me to participate in the care of this pleasant patient.  If you would like a copy of my office note, please contact our Medical Records department at [phone].  Please feel free to contact me should you have any concerns or questions.    Sincerely,        Dub Mikes, MD

## 2008-01-08 NOTE — Unmapped (Signed)
Signed by Donia Guiles MA on 01/08/2008 at 15:06:59    Phone Note   Patient Call  [facility] Cell Phone #: [phone]  Caller: patient  Department: Neurology  Call for: Dr Cherylann Banas    Summary of Call: Re sample med that was given to her expired.  Please call her @ [phone]   Current Allergies:   ! COMPAZINE    Initial call taken by: Laney Pastor,  January 08, 2008 2:14 PM      Follow-up for Phone Call   Spoke wit pt and got things cleared up.She will fill TPM script  phone call completed  Follow-up by: Megan Mans MA,  January 08, 2008 3:06 PM

## 2008-01-09 ENCOUNTER — Inpatient Hospital Stay

## 2008-01-10 NOTE — Unmapped (Signed)
Signed by Alla Feeling PsyD on 01/10/2008 at [facility]  Initial Evaluation and Assessment                      ]

## 2008-01-10 NOTE — Unmapped (Signed)
Signed by   LinkLogic on 01/10/2008 at 14:19:32    Appointment status changed to no show by  [employer] on 01/10/2008 2:19 PM.    No Show Comments  ----------------  ([facility]) [facility]ENT[facility]    Appointment Information  -----------------------  Appt Type:         Date:  Wednesday, January 10, 2008       Time:  1:30 PM for 60 min    Urgency:  Routine    Made By:  Silverio Lay   To Visit:  Berton Lan PSYD     Reason:  ([facility]ENT[facility]    Appt Comments  -------------  -- 01/10/08 14:19: ([facility]) NO SHOW --  ([facility]) [facility]ENT[facility]  .    -- 01/03/08 8:06: [facility]) BOOKED --  Routine  at 01/10/2008 1:30 PM for 60 min  ([facility]) MENT[facility]  .    -- 01/02/08 11:54: [facility]) BOOKED --  Routine  at 01/10/2008 1:30 PM for 60 min

## 2008-01-17 NOTE — Unmapped (Signed)
Signed by Dub Mikes MD on 01/17/2008 at 00:00:00  [facility]      Imported By: Heath Gold 01/23/2008 09:36:49    _____________________________________________________________________    External Attachment:    Please see [employer] EMR for this document.

## 2008-01-23 NOTE — Unmapped (Signed)
Signed by Donia Guiles MA on 01/25/2008 at 15:21:15    Phone Note   Patient Call  Call back at Home Phone: [phone]  Caller's Cell Phone #: [phone]  Caller: patient  Department: Neurology  Call for: Dr Cherylann Banas    Summary of Call: Needs to speak with you about going back to work and about her meds.  Please call her @ [phone]   Current Allergies:   ! COMPAZINE    Initial call taken by: Laney Pastor,  January 23, 2008 10:41 AM      Follow-up for Phone Call   Spoke with pt. She says she didn't start the TPM until about 2 weeks after office visit because she needed to wait until she got paid. She is currently on TPM 25 twice a day. She is to start 50mg  on Friday. She is due to go back to work next Monday. She wants to know if she can have another week to get use to the 50mg . Will speak with Dr. Cherylann Banas on Wed and then call pt. She understood  Follow-up by: Megan Mans MA,  January 23, 2008 12:57 PM    Additional Follow-up for Phone Call   Was still waiting for answer and has heard back yet.  Please call her @ [phone] or [phone]  Additional Follow-up by: Laney Pastor,  January 25, 2008 1:39 PM    Additional Follow-up for Phone Call   Spoke iwth pt and informed her we would have her return to work on 02-05-08. She understood. Will fax letter stating that.   phone call completed  Additional Follow-up by: Megan Mans MA,  January 25, 2008 3:19 PM

## 2008-01-25 ENCOUNTER — Inpatient Hospital Stay

## 2008-01-25 NOTE — Unmapped (Signed)
Signed by Donia Guiles MA on 01/25/2008 at 15:21:15        [facility]   [address] [place], [place] [address]   [phone]  Fax: [phone]                       January 25, 2008              RE: Melanie [name]   DOB:  1956-12-06      [name] is currently under my care. She will return to work on February 05, 2008. We are trying to regulate some of her medications and would like for her to remain off work for this. Please contact my office if you have any questions. Thank you.          Sincerely,      Dub Mikes, MD

## 2008-01-30 ENCOUNTER — Inpatient Hospital Stay

## 2008-02-07 NOTE — Unmapped (Signed)
Signed by Donia Guiles MA on 02/07/2008 at 16:03:05    Phone Note   Patient Call  [facility] Cell Phone #: [phone]  Caller: patient  Department: Neurology  Call for: Dr Cherylann Banas    Summary of Call: Wants her topamax called into [facility] because it is much cheaper.  [facility] # [id number]   Current Allergies:   ! COMPAZINE    Initial call taken by: Laney Pastor,  February 07, 2008 4:01 PM      Follow-up for Phone Call   called to pharmacy  phone call completed, Rx completed  Follow-up by: Megan Mans MA,  February 07, 2008 4:02 PM

## 2008-02-07 NOTE — Unmapped (Signed)
Signed by Donia Guiles MA on 02/07/2008 at 13:10:29    Phone Note   Patient Call  Caller: patient  Department: Neurology  Call for: Dr Cherylann Banas    Summary of Call: Started new med and it is working for her and wants to know if it can be called in for her.  She would like someone to call her back asap about it.  Please call her @  [phone] or [phone]   Current Allergies:   ! COMPAZINE    Initial call taken by: Laney Pastor,  February 07, 2008 12:04 PM      Follow-up for Phone Call   Spoke with pt. Will call in Topamax for pt to pharmacy  phone call completed, Rx completed  Follow-up by: Megan Mans MA,  February 07, 2008 1:10 PM    Prescriptions:  TOPAMAX 25 MG  TABS (TOPIRAMATE) 2 by mouth twice a day  #120 x 6   Entered by: Megan Mans MA   Authorized by: Dub Mikes MD   Signed by: Megan Mans MA on 02/07/2008   Method used: Telephoned to ...     [facility]     [address]     [place], [place]  [address]     Ph: [phone]     Fax: [phone]   RxID: [id number]

## 2008-02-28 NOTE — Unmapped (Signed)
Signed by Simeon Craft MD on 02/28/2008 at 00:00:00  Privacy Notice      Imported By: Lorelei Pont MA 03/19/2008 08:56:30    _____________________________________________________________________    External Attachment:    Please see [employer] EMR for this document.

## 2008-02-28 NOTE — Unmapped (Signed)
Signed by Simeon Craft MD on 02/28/2008 at 12:14:22      Reason for Visit   Chief Complaint: has itching and inflammed skin     History from: patient    Allergies  ! COMPAZINE    Medications     Vital Signs:   Wt: 237 lbs.      BMI: 40.83  BSA: 2.10  Wt chg (lbs): -6  Temperature: 99.0  degrees F  oral    Patient appears to be in acute distress: no  BP: 124/88  Cuff size: large    Intake recorded by: Ebony Cargo MA on February 28, 2008 11:28 AM    History of Present Illness   Chief Complaint: c/o welts  Pt worked last night 3rd shift, ran an errand, went home watched Tv and was getting ready to go to sleep then started to itch on left arm and noticed more on her breasts.   no new exposures. But she did take Provigil for last 2 nights since she went back to work after being off 2 months. She has taken only twice, last 2 nights. Also last night made a smoothie with strawberries, bananas, fres with orange juice, plain yogurt. Pt had been on Provigil in past for several years, it wsn't working as well and so she had stopped it. He started topamax for Migraines. Has been on that for 2 months.       Review of Systems       Physical Examination:   BP: 124/  88    Physical Exam- Detail:   General Appearance: well-developed, well-nourished and in no acute distress.  Skin: there are 3 smal wheals to left forearm 2 large ones on e on each breast. she's scratiching her neck, also  Respiratory: Respiration un-labored.  Lung fields clear to auscultation.  No wheezing, rales, rhonchi or pleural rub.           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 dowbt her current meds. If persists, will stop meds. could be stress induced.    Today's Orders   99213 - Ofc Vst, Est Level III [email or web address]                ]

## 2008-03-08 NOTE — Unmapped (Signed)
Signed by Simeon Craft MD on 03/08/2008 at 12:33:48      Reason for Visit   Chief Complaint: here for cough body aches fever wheezing 2 days     History from: patient    Allergies  ! COMPAZINE    Medications     Vital Signs:   Wt: 234 lbs.      BMI: 40.31  BSA: 2.09  Wt chg (lbs): -3  Temperature: 98.1  degrees F  oral    Patient appears to be in acute distress: no  BP: 125/90  Cuff size: large    Pulse Oximetry:   O2 Saturation: 96 % w/ room air   Intake recorded by: Ebony Cargo MA on March 08, 2008 11:38 AM    Peak Flow   Peak Flow: 260 L/min.    History of Present Illness   Chief Complaint: Cough, congestion  Began 2 days ago with cough body aches head congestion, began tuesday night. Hasn't worked since Tuesday.Feels little less achy today .      Review of Systems       Physical Examination:   BP: 125/  90    Physical Exam- Detail:   General Appearance: well-developed, well-nourished and in no acute distress.  Respiratory: coarse rhonci, wheezy cough-after 1 hhn, some didistant end exp wheezes in bases. better excursions  Neck: No thyromegaly.  No nodules, masses or tenderness.  Lymphatic: Areas palpated not enlarged:  cervical, supraclavicular.  Cardiac: S1 and S2 normal.  RRR without murmurs, rubs, gallops.  No JVD.           Follow-up for Test Results:     New Problems:  BRONCHITIS, ACUTE WITH BRONCHOSPASM (ICD-466.0)  WHEEZING (ICD-786.07)  New Medications:  PROAIR HFA 108 (90 BASE) MCG/ACT  AERS (ALBUTEROL SULFATE) 2 puffs every 4-6 hours as needed  AEROCHAMBER PLUS   MISC (SPACER/AERO-HOLDING CHAMBERS) use with MDi  PROMETHAZINE-CODEINE 6.25-10 MG/5ML  SYRP (PROMETHAZINE-CODEINE) 1 tsp by mouth every 4-6 hours as needed coughing  AUGMENTIN 875-125 MG  TABS (AMOXICILLIN-POT CLAVULANATE) 1 tab by mouth bid        Coordinating Care Providers   PCP Name: [name], MD            Prescriptions:  AUGMENTIN 875-125 MG  TABS (AMOXICILLIN-POT CLAVULANATE) 1 tab by mouth bid  #20 x 0   Entered and  Authorized by: Simeon Craft MD   Signed by: Marlana Salvage VFIEPPIR MD on 03/08/2008   Method used: Print then Give to Patient   RxID: [id number]  PROMETHAZINE-CODEINE 6.25-10 MG/5ML  SYRP (PROMETHAZINE-CODEINE) 1 tsp by mouth every 4-6 hours as needed coughing  #4 oz x 0   Entered and Authorized by: Simeon Craft MD   Signed by: Marlana Salvage [id number] MD on 03/08/2008   Method used: Print then Give to Patient   RxID: [id number]  AEROCHAMBER PLUS   MISC (SPACER/AERO-HOLDING CHAMBERS) use with MDi  #1 x 0   Entered and Authorized by: Simeon Craft MD   Signed by: Marlana Salvage [id number] MD on 03/08/2008   Method used: Print then Give to Patient   RxID: [id number]  PROAIR HFA 108 (90 BASE) MCG/ACT  AERS (ALBUTEROL SULFATE) 2 puffs every 4-6 hours as needed  #1 x 0   Entered and Authorized by: Simeon Craft MD   Signed by: Marlana Salvage [id number] MD on 03/08/2008   Method used: Print then Give to Patient   RxID: [id number]  Assessment and Plan  New Problems:  Dx of BRONCHITIS, ACUTE WITH BRONCHOSPASM (ICD-466.0)  Onset: 03/08/2008  Dx of WHEEZING (ICD-786.07)  Onset: 03/08/2008    Medications   New Prescriptions/Refills:  AUGMENTIN 875-125 MG  TABS (AMOXICILLIN-POT CLAVULANATE) 1 tab by mouth bid  #20 x 0, 03/08/2008, Simeon Craft MD  PROMETHAZINE-CODEINE 6.25-10 MG/5ML  SYRP (PROMETHAZINE-CODEINE) 1 tsp by mouth every 4-6 hours as needed coughing  #4 oz x 0, 03/08/2008, Simeon Craft MD  AEROCHAMBER PLUS   MISC (SPACER/AERO-HOLDING CHAMBERS) use with MDi  #1 x 0, 03/08/2008, Simeon Craft MD  PROAIR HFA 108 (90 BASE) MCG/ACT  AERS (ALBUTEROL SULFATE) 2 puffs every 4-6 hours as needed  #1 x 0, 03/08/2008, Simeon Craft MD    New medications:  PROAIR HFA 108 (90 BASE) MCG/ACT  AERS -- 2 puffs every 4-6 hours as needed  Start date: 03/08/2008  AEROCHAMBER PLUS   MISC -- use with MDi  Start date: 03/08/2008  PROMETHAZINE-CODEINE 6.25-10 MG/5ML  SYRP -- 1 tsp by mouth every 4-6 hours as needed coughing   Start date: 03/08/2008  AUGMENTIN 875-125 MG  TABS -- 1 tab by mouth bid  Start date: 03/08/2008      Today's Orders   Inhalation Treatment [CPT-94640]  99213 - Ofc Vst, Est Level III [ZOX-09604]

## 2008-03-08 NOTE — Unmapped (Signed)
Signed by Simeon Craft MD on 03/08/2008 at 12:22:48      Work Excuse   This patient was seen in the office 03/08/2008.  May not return to work until 03/10/2008.  Comments: excuse this past tues, wed and thursday(10-13th)

## 2008-03-11 NOTE — Unmapped (Signed)
Signed by Donia Guiles MA on 03/11/2008 at 14:26:40    Phone Note   Patient Call  Caller's Cell Phone #: [phone]  Caller: patient  Department: Neurology  Call for: Dr Cherylann Banas    Summary of Call: Wants to talk to someone about switching her meds.  Please call her @ [phone]   Current Allergies:   ! COMPAZINE    Initial call taken by: Laney Pastor,  March 11, 2008 12:20 PM      Follow-up for Phone Call   Spoke with pt. she says she stopped the Topamax. When she went back to work she couldn't concentrate being on that medication. She was taking 50 twice a day. She says she has not been getting headaches so she doesn't want to try anything else at this time. She would like to have some samples of PRovigi which I will leave at the front desk and she will pick up.   phone call completed  Follow-up by: Megan Mans MA,  March 11, 2008 2:26 PM

## 2008-03-21 NOTE — Unmapped (Signed)
Signed by Donia Guiles MA on 03/21/2008 at 12:55:38    Prescriptions:  REBIF 44 MCG/0.5ML SOLN (INTERFERON BETA-1A) 1 injection TIW  #36 x 3   Entered by: Megan Mans MA   Authorized by: Dub Mikes MD   Signed by: Megan Mans MA on 03/21/2008   Method used: Telephoned to ...     [facility]     [address] [place], [place]  [address]     Ph: [phone]     Fax: [phone]   RxID: [id number]

## 2008-03-31 NOTE — Unmapped (Signed)
Signed by Dub Mikes MD on 03/31/2008 at 00:00:00  FML[employer]      Imported By: Betsey Amen 04/10/2008 09:04:10    _____________________________________________________________________    External Attachment:    Please see [facility] EMR for this document.

## 2008-04-24 NOTE — Unmapped (Signed)
Signed by Donia Guiles MA on 04/24/2008 at 16:11:02    Phone Note   Patient Call  Caller's Cell Phone #: [phone]  Caller: patient  Department: Neurology  Call for: Dr Cherylann Banas    Summary of Call: Wants to know if there are any samples of Provigil or the new medication that is taking the place of the Provigil. Please call her @ [phone]   Current Allergies:   ! COMPAZINE    Initial call taken by: Laney Pastor,  April 24, 2008 4:06 PM      Follow-up for Phone Call   Spoke withpt and informed her I do not have any Provigil samples and the Newvigil is not coming out until June.   phone call completed  Follow-up by: Megan Mans MA,  April 24, 2008 4:11 PM

## 2008-04-29 NOTE — Unmapped (Signed)
Signed by   LinkLogic on 04/29/2008 at 09:18:48    Appointment status changed to no show by  LinkLogic on 04/29/2008 9:18 AM.    No Show Comments  ----------------  (MOF) MULTIPLE SCLEROSIS    Appointment Information  -----------------------  Appt Type:         Date:  Monday, Apr 29, 2008       Time:  9:00 AM for 60 min    Urgency:  Routine    Made By:  [name] MA   To Visit:  Melanie [name] S NP     Reason:  (MOF) MULTIPLE SCLEROSIS    Appt Comments  -------------  -- 04/29/08 9:18: [name] NO SHOW --  (MOF) MULTIPLE SCLEROSIS  RET    -- 02/28/08 13:39: (DODDSL) BOOKED --  Routine  at 04/29/2008 9:00 AM for 60 min  (MOF) MULTIPLE SCLEROSIS  RET

## 2008-05-27 NOTE — Unmapped (Signed)
Signed by Donia Guiles MA on 05/27/2008 at 15:33:48    Phone Note   Patient Call  Call back at Home Phone: [phone]  Caller: patient  Department: Neurology  Call for: Dr Cherylann Banas    Summary of Call: Was a pt of Dr Connye Burkitt missed appt with Letitia Libra and now wants to speak to [facility]. Please call her @ [phone]   Current Allergies:   ! COMPAZINE    Initial call taken by: Laney Pastor,  May 27, 2008 2:18 PM      Prescriptions:  PROVIGIL 200 MG TABS (MODAFINIL) 1 po bid  #60 x 5   Entered by: Megan Mans MA   Authorized by: Nestor Lewandowsky MD   Signed by: Megan Mans MA on 05/27/2008   Method used: Telephoned to ...     [facility]     [address]     [place], [place]  [address]     Ph: [phone]     Fax: [phone]   RxID: [id number]

## 2008-05-27 NOTE — Unmapped (Signed)
Signed by Seymour Bars MA on 05/28/2008 at 13:44:41    Phone Note   Call from Pharmacy    Pharmacy Name: [facility]  Caller: [facility]  Pharmacy Phone Number: [phone]  Call for: Dr Grayland Jack  Summary of call: PA on Provigil.  Ins co number [phone]  Initial call taken by: Laney Pastor,  May 27, 2008 4:00 PM      Follow-up for Phone Call   will call [phone] int he am  Follow-up by: Seymour Bars MA,  May 27, 2008 4:15 PM    Additional Follow-up for Phone Call   Letter for PA faxed to Clydie Braun at [phone]  phone call completed  Additional Follow-up by: Seymour Bars MA,  May 28, 2008 1:44 PM

## 2008-05-28 NOTE — Unmapped (Signed)
Signed by Seymour Bars MA on 05/28/2008 at 13:43:11        [facility]   [address] [place], [place] [address]   [phone]  Fax: [phone]                       May 28, 2008              RE: Melanie [name]   DOB:  May 12, 1956          ATTN: Clydie Braun         Patient has a diagnosis of Fatigue Relasping-Remitting Multiple Sclerosis.  She has been receiving Provigil 200mg  1 by mouth twice a day since 07/2004.  This medication has been very sucessful in helping her with her fatigue.  Please review recent chart notes to approve this medication for this patient.  If you have any further questions please contact me at [phone].            Sincerely,      Hulan Fess, MD

## 2008-05-28 NOTE — Unmapped (Signed)
Signed by Buel Ream CNP on 05/28/2008 at 14:09:28      Work Excuse   Diagnosis: SCLEROSIS, MULTIPLE - ICD-340  Date patient seen in the office: 05/28/2008.  Additional Comments: With pt having MS, Brycelynn has significant FATIGUE  and may need understanding when working her job.  Thank you.  Sincerely,      Buel Ream, CNP

## 2008-05-28 NOTE — Unmapped (Signed)
Signed by Buel Ream CNP on 05/30/2008 at 09:08:45    Multiple Sclerosis Visit      History of Present Illness- Multiple Sclerosis:   Chief Complaint: routine MS f/u  Handedness: right  HPI: Pt is 51yo AAF here for f/u visit for MS. Had been seeing Dr Cherylann Banas per record.    Reviewed most recent chart notes.  Continues to have ongoing issues with stress primarily financial and work stress.  Spoke in detail regarding these stresses and current coping strategies. Works night shift in OR as Solicitor and gets tired when there is nothing to do.  Seems like summer months cause more fatigue - interested in trying a cooling product.  Pt compliant with Rebif but at times switches days takes medicine to adjut for side effects.  Has been on Rebif for 6 yrs.  First attack was 6 yrs ago with bilateral vision loss.  Sees Dr Murray Hodgkins for PCP   Past History  Past Medical History (reviewed - no changes required):  Hypertension  Social History (reviewed - no changes required): Marital Status: divorced,   Employment Status: employed full-time,   Occupation: Teacher, music  Patient Lives at: apartment,   Alcohol Use: occasionally  Tobacco Usage:non-smoker    Review of Systems   General: Complains of sweats, fatigue, malaise, drowsiness, insomnia.   Eyes: Denies blurring, diplopia, vision loss.   Ears/Nose/Throat: Denies any specific issues at this time.   Cardiovascular: Denies any specific issues at this time.   Respiratory: Complains of dyspnea, wheezing.   Gastrointestinal: Denies any specific issues at this time.   Genitourinary: Complains of urinary frequency, incomplete empty.   Musculoskeletal: Denies muscle cramps, muscle weakness.   Neurologic: Complains of headache, word finding problems, concentration problems, memory problems. Denies falls.       Intake-Neurology   Chief Complaint: routine MS f/u  Handedness: right    Vital Signs   Height: 64 inches    BP #1: 136 / 82mm Hg   Allergies  ! COMPAZINE  New Medication:  ATIVAN 1  MG  TABS (LORAZEPAM) take 1 by mouth 1 hour before scan 1 by mouth right before scan    Intake recorded by: Seymour Bars MA  May 28, 2008 1:17 PM      Diagnostic Testing:   MRI History 2004 per Dr Milas Kocher notation - noticeable black holes with moderate to severe lesion load.        Physical Exam-Neurology   Appearance: well-developed, well-nourished and in no acute distress.  Language/Speech: no aphasia and no dysarthria  Atten/concentration: awake and alert  Orientation: oriented x3  Memory: grossly intact  Fund of Knowledge: grossly intact  Ophthalmoscopic: no papilledema/good venous pulsations  Pupils: equal, round, reactive to light and accomodation  Visual Fields:   normal  CN 3,4,6 (EOM): extraocular movements intact, normal gaze alignment, and no nystagmus  Cranial Nerve 5 (Trigeminal): bilateral facial sensation normal  Cranial Nerve 7 (Facial): normal facial symmetry and strength  CN 8 (Auditory): proper hearing to finger rub bilaterally  Cranial Nerve 9 (Glossophar): soft palate elevates in the midline spontaneously  CN 11 (spinal access): full shoulder shrug equally  Cranial Nerve 12 (Hypoglossal): tongue is midline on protrusion    General:   Head and Neck no lymphadenopathy, carotids 2+/no bruits, nl thyroid gland  Pulmonary: clear to auscultation  Extremities: no edema, cyanosis, clubbing, pulses 2+    Muscle Strength   Muscle strength is 5/5 in all major muscle groups.  Further Strength Testing 5- in L LE distally. Muscle aches and pains limits the validity of the exam.    Reflexes     Right   Biceps (right): decreased  Triceps (right): decreased  Brachio Radialis (right): decreased  Patellar (right): decreased  Ankle Jerk (right): decreased    Left   Biceps (left): decreased  Triceps (left): decreased  Brachio Radialis (left): decreased  Patellar (left): decreased  Ankle Jerk (left): decreased  Other Reflex Testing: 2+ throughout, equivocal left toe, downgoing right toe.      Coordination/ Station  & Gait:   Rapid alternating movements (right): normal  Finger to nose (right):normal  Heel to shin (right): normal  Finger Tapping (right): normal  Rapid alternating movements (left):-1  Finger to nose (left):-1  Heel to shin (left): normal  Finger Tapping (left): normal  Romberg: positive  Station/Gait: ataxic  Further Cerebellar/Gait testing: mild gait ataxia, steps out 8/10, able to doe toes better than heels, both are worse on L      Assessment and Plan  Status of Existing Problems:  Assessed SCLEROSIS, MULTIPLE as unchanged - appears stable - continue Rebif.  Recheck MRI with ativan for claustraphobia  check labs. Recommended Aquatic Therapy - pt is interested - Buel Ream CNP  New Problems:  Dx of FATIGUE (ICD-780.79)  Onset: 05/28/2008    Medications   New Prescriptions/Refills:  ATIVAN 1 MG  TABS (LORAZEPAM) take 1 by mouth 1 hour before scan 1 by mouth right before scan  #2 x 0, 05/28/2008, Seymour Bars MA    New medications:  ATIVAN 1 MG  TABS -- take 1 by mouth 1 hour before scan 1 by mouth right before scan  Start date: 05/28/2008        Plan    Pt to aquire old MRI to help with comparisons at next visit.   Today's Orders   CBC + plat + Diff  (6399) [CPT-85025]  Liver function panel  (LIVP) (10256) [CPT-80076]  MRI, Brain w/ and w/o gadolinium [CPT-70559]  MRI, Spine, Cervical, w/ & w/o contrast [CPT-72156]  99214 - Ofc Vst, Est Level IV [WJX-91478]    Disposition/Follow Up:   Return to clinic in 3 Risk of complications: moderate    Time spent with patient:   Established: 25 min (29562)      Prescriptions:  ATIVAN 1 MG  TABS (LORAZEPAM) take 1 by mouth 1 hour before scan 1 by mouth right before scan  #2 x 0   Entered by: Seymour Bars MA   Authorized by: Buel Ream CNP   Signed by: Seymour Bars MA on 05/28/2008   Method used: Print then Give to Patient   RxID: [id number]    Sincerely,      Buel Ream, CNP    Medical Decision-Making   Visit was primarily counseling/coordination.      Risk of complications: moderate          ]

## 2008-06-05 NOTE — Unmapped (Signed)
Signed by   LinkLogic on 06/06/2008 at 07:54:28  Patient: Melanie [name]  Note: All result statuses are Final unless otherwise noted.    Tests: (1) MRI-C-SPINE W/WO CONTRAST [phone])    Order NotePricilla Handler Order Number: [id number]    Order Note:     *** VERIFIED ***  [facility]  Reason:  MULTIPLE SCLEROSIS  Dict.Staff: Wellington Hampshire [phone]    Verified By: Wellington Hampshire     Ver: 06/06/08   7:54 am  Exams:  MRI-C-SPINE W/WO CONTRAST      MRI head with and without contrast, MRI cervical spine with and  without contrast.    HISTORY: Multiple sclerosis.    MRI head: Pre- and postgadolinium imaging of the brain is  compared to prior of 04/12/2003. There is mild progression in  periventricular T2 hyperintensities with a distribution and  appearance compatible with demyelinating disease. Similar  changes are seen in the posterior aspect of the upper pons.  Several of these foci in the corona radiata demonstrate mildly  restricted effusion. There is no intracranial hemorrhage or  hydrocephalus. Sinuses and mastoids are clear. Arterial and  venous flow-voids are normal. No osseous destructive lesion.    MRI cervical spine: Pre- and postgadolinium imaging of the  cervical spine is compared to 01/31/2007. Alignment is anatomic.  Vertebral body height and signal are well maintained. There is  no evidence of demyelinating disease in the cervical cord. Mild  disc bulges are again noted C4-C5 and C5-C6.    IMPRESSION:    Mild progression of periventricular and pontine demyelinating  disease in comparison to 04/12/2003.    No demyelinating plaques visualized in the cervical spine.  **** end of result ****    Order Note:   EMR Routing to: Letitia Libra  (NURSE PRACT) S, CNP - ordering [id number]    Note: An exclamation mark (!) indicates a result that was not dispersed into   the flowsheet.  Document Creation Date: 06/06/2008 7:54 AM  _______________________________________________________________________    (1) Order result  status: Final  Collection or observation date-time: 06/05/2008 16:41:30  Requested date-time: 06/05/2008 15:12:00  Receipt date-time:   Reported date-time: 06/06/2008 07:54:24  Referring Physician: Dedra Skeens  (NURSE PRACT) Laural Benes  Ordering Physician: Dedra Skeens  (NURSE PRACT) Laural Benes [facility])  Specimen Source:   Source: QRS  Filler Order Number: [id number]  Lab site: [facility]

## 2008-06-05 NOTE — Unmapped (Signed)
Signed by   LinkLogic on 06/06/2008 at 07:54:27  Patient: Melanie [name]  Note: All result statuses are Final unless otherwise noted.    Tests: (1) MRI-HEAD W/WO CONTRAST [id number])    Order NotePricilla Handler Order Number: [id number]    Order Note:     *** VERIFIED ***  [facility]  Reason:  MULTIPLE SCLEROSIS  Dict.Staff: Wellington Hampshire [phone]    Verified By: Wellington Hampshire     Ver: 06/06/08   7:54 am  Exams:  MRI-C-SPINE W/WO CONTRAST      MRI head with and without contrast, MRI cervical spine with and  without contrast.    HISTORY: Multiple sclerosis.    MRI head: Pre- and postgadolinium imaging of the brain is  compared to prior of 04/12/2003. There is mild progression in  periventricular T2 hyperintensities with a distribution and  appearance compatible with demyelinating disease. Similar  changes are seen in the posterior aspect of the upper pons.  Several of these foci in the corona radiata demonstrate mildly  restricted effusion. There is no intracranial hemorrhage or  hydrocephalus. Sinuses and mastoids are clear. Arterial and  venous flow-voids are normal. No osseous destructive lesion.    MRI cervical spine: Pre- and postgadolinium imaging of the  cervical spine is compared to 01/31/2007. Alignment is anatomic.  Vertebral body height and signal are well maintained. There is  no evidence of demyelinating disease in the cervical cord. Mild  disc bulges are again noted C4-C5 and C5-C6.    IMPRESSION:    Mild progression of periventricular and pontine demyelinating  disease in comparison to 04/12/2003.    No demyelinating plaques visualized in the cervical spine.  **** end of result ****    Order Note:   EMR Routing to: Letitia Libra  (NURSE PRACT) S, CNP - ordering [id number]    Note: An exclamation mark (!) indicates a result that was not dispersed into   the flowsheet.  Document Creation Date: 06/06/2008 7:54 AM  _______________________________________________________________________    (1) Order result  status: Final  Collection or observation date-time: 06/05/2008 16:41:34  Requested date-time: 06/05/2008 15:12:00  Receipt date-time:   Reported date-time: 06/06/2008 07:54:24  Referring Physician: Dedra Skeens  (NURSE PRACT) Laural Benes  Ordering Physician: Dedra Skeens  (NURSE PRACT) Laural Benes [facility])  Specimen Source:   Source: QRS  Filler Order Number: [id number]  Lab site: [facility]

## 2008-06-11 NOTE — Unmapped (Signed)
Signed by Seymour Bars MA on 06/11/2008 at 14:05:40    Phone Note   Patient Call  Call back at Home Phone: [phone]  Caller's Cell Phone #: [phone]  Caller: patient  Department: Neurology  Call for: Letitia Libra    Summary of Call: York Spaniel she returning call from [place]. Please call her @ [phone] or [phone]   Current Allergies:   ! COMPAZINE    Initial call taken by: Laney Pastor,  June 11, 2008 1:54 PM      New Orders:  Other, Referral [phone]

## 2008-07-18 NOTE — Unmapped (Signed)
Signed by Donia Guiles MA on 07/18/2008 at 11:37:29    Phone Note   Patient Call  Call back at Home Phone: [phone]  Caller's Cell Phone #: [phone]  Caller: patient  Department: Neurology  Call for: [facility]    Summary of Call: Was told by Duwayne Heck about a new drug and was going to get samples, provigil does not work for her anymore. Please call her @ [phone]   Current Allergies:   ! COMPAZINE    Initial call taken by: Laney Pastor,  July 18, 2008 11:24 AM      New Medications:  * NUVIGIL 150MG  1 by mouth daily    Follow-up for Phone Call   Spoke iwth pt. Willl give pt samples of Nuvigil 150mg  to try. She will pick up  phone call completed  Follow-up by: Megan Mans MA,  July 18, 2008 11:36 AM

## 2008-07-30 NOTE — Unmapped (Signed)
Signed by Ebony Cargo MA on 07/30/2008 at 11:53:36    Prescriptions:  LISINOPRIL-HYDROCHLOROTHIAZIDE 20-25 MG TABS (LISINOPRIL-HYDROCHLOROTHIAZIDE) 1 by mouth daily  #30 x 1   Entered by: Ebony Cargo MA   Authorized by: Simeon Craft MD   Signed by: Ebony Cargo MA on 07/30/2008   Method used: Handwritten   RxID: [id number]

## 2008-08-09 NOTE — Unmapped (Signed)
Signed by Seymour Bars MA on 08/13/2008 at 09:19:20    PHONE NOTE - Patient Call    Caller's Cell Phone #: [phone]  Caller: patient  Department: Neurology  Call for: Letitia Libra    Reason for Call: Wants to speak to someone about Aquatic therapy. Please call her @ [phone] and also has questions about meds.      Initial call taken by: Laney Pastor,  August 09, 2008 2:45 PM    Current Allergies:   ! COMPAZINE    New Orders:  Other, Referral [phone]  FOLLOW UP  LEFT MESSAGE TO CALL OFFICE  Follow-up by: Seymour Bars MA,  August 12, 2008 9:33 AM    ADDITIONAL FOLLOW UP  Missed Kristis call please call her @ [phone]  Follow-up by: Laney Pastor,  August 12, 2008 10:01 AM    ADDITIONAL FOLLOW UP  SPOKE WITH PATIENT WILL MAIL RX FOR THERAPY  patient advised  Follow-up by: Seymour Bars MA,  August 13, 2008 9:19 AM

## 2008-08-20 NOTE — Unmapped (Signed)
Signed by Seymour Bars MA on 08/20/2008 at 14:04:07    PHONE NOTE - Patient Call    Caller's Cell Phone #: [phone]  Caller: patient  Department: Neurology  Call for: Sayid Moll    Reason for Call: Wants to know if there are any samples of the new meds she is taking, she can not remember the name. Please call her @ [phone]      Initial call taken by: Laney Pastor,  August 20, 2008 1:27 PM    Current Allergies:   ! COMPAZINE    FOLLOW UP  Samples ready for pick up  Follow-up by: Seymour Bars MA,  August 20, 2008 2:03 PM

## 2008-08-26 NOTE — Unmapped (Signed)
Signed by Seymour Bars MA on 08/27/2008 at 09:48:30    PHONE NOTE - Patient Call    Call back at Home Phone: [phone]  Caller: patient  Department: Neurology  Call for: Letitia Libra    Reason for Call: Wants to get stronger dose of Nuvigil. Please call her @ [phone]. She wants to see if there are anymore samples.  She also has a question about a note.      Initial call taken by: Laney Pastor,  August 26, 2008 3:49 PM    Current Allergies:   ! COMPAZINE    FOLLOW UP  Left message to call office  Follow-up by: Seymour Bars MA,  August 26, 2008 4:11 PM    ADDITIONAL FOLLOW UP  SPOKE WITH PATIENT SAMPLES OF NUVIGIL 250MG    Follow-up by: Seymour Bars MA,  August 27, 2008 9:48 AM

## 2008-10-01 ENCOUNTER — Inpatient Hospital Stay

## 2008-10-01 LAB — POCT RAPID STREP A: Rapid Strep A Screen: NEGATIVE

## 2008-10-01 NOTE — Unmapped (Signed)
Signed by Simeon Craft MD on 10/01/2008 at 00:00:00  EKG Report      Imported By: Lorene Dy MA 01/01/2009 11:52:20    _____________________________________________________________________    External Attachment:    Please see [facility] EMR for this document.

## 2008-10-01 NOTE — Unmapped (Signed)
Signed by Simeon Craft MD on 10/01/2008 at 18:56:38      Reason for Visit   Chief Complaint: pt here cough congestion wheezing chest pain and tightness    History from: patient    Allergies  ! COMPAZINE    Medications     Vital Signs:   Wt: 242 lbs.      BMI: 41.69  BSA: 2.12  Wt chg (lbs): 8  Temperature: 98.5  degrees F  oral    Patient appears to be in acute distress: no  BP: 120/90  Cuff size: regular    Pulse Oximetry:   O2 Saturation: 95 % w/ room air   Intake recorded by: Ebony Cargo MA on October 01, 2008 2:49 PM    Peak Flow   Peak Flow: 250 L/min.    History of Present Illness   Chief Complaint: began last night with chest pain  began at 3am with belching then lot og gas passing. Pain felt like a burning Went home went to sleep. Feeling head congested eyes burning HA, Sore throat and coughing a little bit. Phlegm was thick last couple of days. Feeling a like gagging it after coughing.    2 weeks ago had body aches tired cough, congested. then it got Better. Pt works in [place].    Past History  Past Medical History:  Asthma, Multiple Sclerosis, Hypertension  Family History: Laryngeal cancer, stomach cancer, skin cancer, brain cancer,   Sister with diabetes   aloholism-father, uncles  Social History: Marital Status: divorced,   Employment Status: employed full-time,   Occupation: Teacher, music  Patient Lives at: apartment,   Alcohol Use: occasionally  Tobacco Usage:non-smoker  Passive Smoke Exposure: yes    Review of Systems  Ears/Nose/Throat: snoring, hx of sinus surgery  Cardiovascular: Denies chest pains, dyspnea on exertion. winded after clinbing stairs from parking lot to work        Physical Examination:   BP: 120/  90    Physical Exam- Detail:   General Appearance: well-developed, well-nourished and in no acute distress.  Oropharynx: unable to visualize the pharnx well  Respiratory: some coarse rhonchi oeft base, faint wheezes. after 1 HHN better movement  Neck: No thyromegaly.  No nodules, masses  or tenderness.  Cardiac: S1 and S2 normal.  RRR without murmurs, rubs, gallops.  No JVD.  Vascular: No carotid bruits.  No edema or varicosities.  Pedal Pulse Left: dorsalis pedis +2, posterior tib +2  Pedal Pulse Right: dorsalis pedis +2,  posterior tib +2  Abdomen: obese. Mild xyphoid tenderness  Psychiatric: Judgement and insight are within normal limits.  Alert and oriented x3.  No mood disorders noted, appropriate affect.  Comments: there is some tenderness to costochondral jxns on left and right lower chest      In office Procedures & Tests     Other tests:   Rapid Strep: negative    EKG Interpretation:   Rate: 82  Rhythm: normal sinus rhythm  Axis: normal  ST Segments: nonspecific STT wave changes  Q Waves: none         New Problems:  SNORING (ICD-786.09)  DYSPNEA ON EXERTION (ICD-786.09)  CHEST PAIN (ICD-786.50)  New Medications:  FLOVENT HFA 110 MCG/ACT AERO (FLUTICASONE PROPIONATE  HFA) 2 puffs every 12 hours  PREDNISONE 20 MG TABS (PREDNISONE) take 3 tabs by mouth daily for 3 days, 2 tabs by mouth daily for 2 days 1 tab by mouth daily for 2 days then 1/2  tab by mouth daily      Preventive Maintenance       Coordinating Care Providers   PCP Name: [name], MD            Prescriptions:  FLOVENT HFA 110 MCG/ACT AERO (FLUTICASONE PROPIONATE  HFA) 2 puffs every 12 hours  #1 x 1   Entered and Authorized by: Simeon Craft MD   Signed by: Marlana Salvage [id number] MD on 10/01/2008   Method used: Print then Give to Patient   RxID: [id number]  PREDNISONE 20 MG TABS (PREDNISONE) take 3 tabs by mouth daily for 3 days, 2 tabs by mouth daily for 2 days 1 tab by mouth daily for 2 days then 1/2 tab by mouth daily  #16 x 0   Entered and Authorized by: Simeon Craft MD   Signed by: Simeon Craft MD on 10/01/2008   Method used: Print then Give to Patient   RxID: [id number]      Assessment and Plan     Problems New Problems:  Dx of SNORING (ICD-786.09)  Onset: 10/01/2008  Dx of DYSPNEA ON EXERTION (ICD-786.09)   Onset: 10/01/2008  Dx of CHEST PAIN (ICD-786.50)  Onset: 10/01/2008    Medications   New Prescriptions/Refills:  FLOVENT HFA 110 MCG/ACT AERO (FLUTICASONE PROPIONATE  HFA) 2 puffs every 12 hours  #1 x 1, 10/01/2008, Simeon Craft MD  PREDNISONE 20 MG TABS (PREDNISONE) take 3 tabs by mouth daily for 3 days, 2 tabs by mouth daily for 2 days 1 tab by mouth daily for 2 days then 1/2 tab by mouth daily  #16 x 0, 10/01/2008, Simeon Craft MD    Today's Orders   Inhalation Treatment [CPT-94640]  EKG, tracing with report [CPT-93000]  99214 - Ofc Vst, Est Level IV [CPT-99214]  Echocardiogram, 2D complete [CPT-93307]  X-ray, Chest, PA & Lateral [CPT-71020]  Sleep Medicine Consult [phone]    Patient Instructions   remote hx Sleep study will set up  echo  use proair ATC, hold provigil while on steroid as may give her the energy feeling    Disposition:   Return to clinic for Doctor Visit in next mon or tues Appointment Reason: f/u wheezing

## 2008-10-01 NOTE — Unmapped (Signed)
Signed by   [employer] on 10/01/2008 at 17:01:40  Patient: Melanie [name]  Note: All result statuses are Final unless otherwise noted.    Tests: (1) DIAG-CHEST PA & LATERAL [phone])    Order NotePricilla Handler Order Number: [id number]    Order Note:     *** VERIFIED ***  [facility]  Reason:  CHEST PAIN,DYSPNEA ON EXERTION, WHEEZING  Dict.Staff: Sherald Hess [id number]    Verified By: Sherald Hess             Ver: 10/01/08   5:01 pm  Exams:  DIAG-CHEST PA & LATERAL      Chest 2 views:  10/01/2008    Clinical Indication: Chest pain, dyspnea on exertion, wheezing.    Comparison: 03/22/2006.    Findings:    PA and lateral views are obtained in upright position. The  cardiac size and pulmonary vascularity appear normal.  No  mediastinal or hilar abnormality is seen. The lungs and pleural  space are clear.    Impression:    No active cardiopulmonary disease.  **** end of result ****    Order Note:   EMR Routing to: Crystie Yanko M - ordering - [id number]  EMR Routing to: Letitia Libra  (NURSE PRACT) S, CNP - copy to - [phone]    Note: An exclamation mark (!) indicates a result that was not dispersed into   the flowsheet.  Document Creation Date: 10/01/2008 5:01 PM  _______________________________________________________________________    (1) Order result status: Final  Collection or observation date-time: 10/01/2008 16:00:00  Requested date-time: 10/01/2008 16:00:00  Receipt date-time:   Reported date-time: 10/01/2008 17:01:37  Referring Physician: Tobi Bastos Tomoki Lucken  Ordering Physician: [name] (DADDABAM)  Specimen Source:   Source: QRS  Filler Order Number: [id number]  Lab site: [facility]

## 2008-10-07 NOTE — Unmapped (Signed)
Signed by   [employer] on 10/10/2008 at 11:06:12  Patient: Melanie [name]  Note: All result statuses are Final unless otherwise noted.    Tests: (1)  ([place])    Order Note: [facility]  Department of Cardiology  [address]  [place], [place] [address]  [phone]  [phone]  Transthoracic Echocardiography    Patient:    Melanie [name]  MR #:       [id number]  Account:    [id number]  Study Date: 10/07/2008  Gender:     F  Age:        52  DOB:        05/18/1956  Room:       OP  Referring Physician:    [name]  Interpreting Physician: Bishop Limbo MD      PERFORMING  [facility]   ORDERING    Referring Nonstaff   REFERRING   Tobi Bastos [id number]   OPERATOR    Linton Rump, RDCS    --------------------------------------------------------------------    Procedure:ECHO-2-D ECHO   Order:ECHO-2-D ECHO   Accession  COMPLETE (TTE)            COMPLETE (TTE)        [id number]    --------------------------------------------------------------------  Indications: 786.09, Dyspnea on Exertion.    --------------------------------------------------------------------  PMH: No prior cardiac history.    --------------------------------------------------------------------  Study data:  Study status: Elective. Procedure: Transthoracic  echocardiography. Image quality was adequate. The study was  technically limited due tobody habitus andpoor acoustic window  availability. Scanning was performed from the parasternal, apical,  and subcostal acoustic windows. Study completion: The patient  tolerated the procedure well and was discharged from the lab.  Transthoracic echocardiography. M-mode, complete 2D, complete  spectral Doppler, and color Doppler. Patient status: Outpatient.  Location: Echo laboratory.    --------------------------------------------------------------------  Study Conclusions    - Procedure narrative: Transthoracic echocardiography. Image quality    was adequate. The study was  technically limited due tobody habitus    andpoor acoustic window availability. Scanning was performed from    the parasternal, apical, and subcostal acoustic windows.  - Left ventricle: The cavity size was normal. Wall thickness was    increased in a pattern of mild LVH. Systolic function was normal.    The estimated ejection fraction was 60%. Although no diagnostic    regional wall motion abnormality was identified, this possibility    cannot be completely excluded on the basis of this study.    --------------------------------------------------------------------  Cardiac Anatomy  Left ventricle:    - The cavity size was normal. Wall thickness was increased in a    pattern of mild LVH. Systolic function was normal. The estimated    ejection fraction was 60%. Although no diagnostic regional wall    motion abnormality was identified, this possibility cannot be    completely excluded on the basis of this study.  Aortic valve:    - Structurally normal valve. Cusp separation was normal.  Doppler:    - Transvalvular velocity was within the normal range. No significant    regurgitation.  Aorta: Aortic root:    - The aortic root was not dilated.  Mitral valve:    - Structurally normal valve. Leaflet separation was normal.  Doppler:    - Transvalvular velocity was within the normal range. No significant    regurgitation.  Left atrium:    - The atrium was normal in size.  Right ventricle:    - The cavity size  was normal.  Tricuspid valve:    - Structurally normal valve. Leaflet separation was normal.  Doppler:    - Transvalvular velocity was within the normal range. Trivial    regurgitation.  Right atrium:    - The atrium was normal in size.  Pericardium:    - There was no pericardial effusion.    --------------------------------------------------------------------    2D measurements                            Normal  Aortic valve  Leaflet separation                 21 mm   15-26     M-mode measurements                         Normal  Left ventricle  LV internal dimension, ED          47 mm   37-56  LV internal dimension, ES          32 mm   ------  LV posterior wall, ED             *12 mm   6-11  LV posterior wall, ES              15 mm   ------  Ventricular septum  Septal thickness, ED               12 mm   ------  Septal thickness, ES               17 mm   ------  Aortic valve  Leaflet separation                 21 mm   15-26  Aorta  Root diameter, ED                  33 mm   20-37  Left atrium  Anterior-posterior dimension, ES   31 mm   19-40  Right ventricle  RV internal dimension, ED          16 mm   ------     Doppler measurements                       Normal  Mitral valve  Peak E-wave velocity             52.8 cm/s ------  Peak A-wave velocity             62.2 cm/s ------  Peak E/A ratio                    0.8      ------  Legend:  Mean values are shown as u=mean value.  Asterisk (*) marks values outside specified normal range.  Reviewed and confirmed by    Bishop Limbo MD  2009-10-15T11:03:39.497    Note: An exclamation mark (!) indicates a result that was not dispersed into   the flowsheet.  Document Creation Date: 10/10/2008 11:06 AM  _______________________________________________________________________    (1) Order result status: Final  Collection or observation date-time: 10/07/2008 15:59:04  Requested date-time:   Receipt date-time:   Reported date-time: 10/10/2008 11:05:29  Referring Physician: [name]  Ordering Physician:  Reviewed In [facility])  Specimen Source:   Source: DBS  Filler Order Number: [id number]  Lab site:

## 2008-10-08 NOTE — Unmapped (Signed)
Signed by Ebony Cargo MA on 10/08/2008 at 10:11:23    PHONE NOTE - Outgoing Call      Call placed by: Simeon Craft MD,  October 08, 2008 4:45 AM  Summary of call: tell Jenisha her CXR was normal.      FOLLOW UP  called and lm to return my call   Follow-up by: Ebony Cargo MA,  October 08, 2008 10:02 AM    ADDITIONAL FOLLOW UP  [place] with pt gave results   Follow-up by: Ebony Cargo MA,  October 08, 2008 10:11 AM

## 2008-10-10 NOTE — Unmapped (Signed)
Signed by Simeon Craft MD on 10/10/2008 at 12:37:34    PHONE NOTE - Outgoing Call      Call placed by: Marlana Salvage ZOXWRUEA MD,  October 10, 2008 12:30 PM  Summary of call: d/w pt her echo not best quality but EF is good. Little LVH. No PA pressures noted. Valves look ok. Asked about her CP. she said better but she has to adjust how she lays and sleeps, on her side to alleviate.

## 2008-10-21 ENCOUNTER — Inpatient Hospital Stay

## 2008-10-21 NOTE — Unmapped (Signed)
Signed by Georgena Spurling MD on 10/21/2008 at 00:00:00  NPSG      Imported By: Maryellen Pile 10/25/2008 11:06:41    _____________________________________________________________________    External Attachment:    Please see [facility] EMR for this document.

## 2008-10-21 NOTE — Unmapped (Signed)
Signed by Domingo Sep on 10/25/2008 at 12:55:13      DAYTIME POLYSOMNOGRAPHY REPORT    Patient Name :   Melanie [name], Melanie [name] Project :   [place], [place] [facility] # :  [id number] Subject Code :  5409/81   Study Date :   Oct. 26, 2009 Referring Physician :   [id number]   Sex :   D.O.B. :    Female  1956/02/09 Sleep Specialist :   Akira Adelsberger     This is a daytime polysomnogram performed on the evening of Oct-26-2009, on this 52 year-old female with a height of 63.0 inches and a weight of 240.0 pounds, for a BMI of 42.5 kg/m2 which is within morbid obesity range. This study was performed because of patient???s complaints of snoring and excessive daytime sleepiness (ESS 16), in this patient with a history of HTN.    This study was performed on a [employer] Elite sleep system, monitoring right and left oculograms, C4-A1, C3-A2, O1-A2, O2-A1, nasal oral airflow via thermocouple and NP, thoracic and abdominal respiratory effort channels via piezoelectric method, EKG, pulse rate, arterial oxyhemoglobin saturation level via pulse oximetry, left and right anterior tibialis EMG, chin EMG, body position, video channel, and snoring microphone.    The raw data was visually analyzed and scored according to AASM accepted criteria. Apnea is defined as cessation of airflow for at least 10 seconds; hypopnea is defined as at least a 50% reduction in airflow signal lasting at least 10 seconds, followed by at least a 3% decrease in oxyhemoglobin saturation or associated with an arousal.    Sleep Parameters:  Review of the electroencephalographic tracings revealed that this patient was studied for 354.9 minutes, of which 273.5 minutes were spent during sleep, for sleep efficiency of 77.1%. The sleep onset latency was 3.5 minutes.    The patient reached 2 REM sleep cycles during this study for REM sleep latency from sleep onset of 123.5 minutes.    Of note, this study was technically adequate, as it did indeed record REM sleep in supine  position.    Of the total sleep time, the patient spent 12.8% in sleep stage 1, 64.0% in sleep stage 2, 15.4% in slow wave sleep (stages 3 and 4 combined), and 7.9% in sleep stage REM.    No clinically significant periodic limb movements were recorded during this study.    There were 17 spontaneous arousals recorded during this study, for a spontaneous arousal index of 3.7 events per hour.    There were 15 respiratory-effort related arousals recorded during this study for a RERAI of 3.3 events per hour.    Cardiorespiratory Parameters:  Review of the respiratory tracings revealed that the patient had 3 obstructive apneas, and 91 obstructive hypopneas recorded during this study, for an apnea-hypopnea (AHI) index of 20.6 events per hour, which is within moderately severe range. The supine event index was 18.9 events per hour and the non-supine event index was 22.3 events per hour. The REM sleep event index was 69.8 events per hour, and the NREM sleep event index was 16.4 events per hour. The mean duration of the obstructive hypopneas was 20.9 seconds with the longest duration of 44.7 seconds.    Review of the oxyhemoglobin saturation tracings revealed that this patient reached a minimum level of 82.0% in REM sleep and 78.0% in NREM sleep. Of the total NREM sleep time, this patient spent 98.6% with levels above 90%. Of the total REM sleep time,  this patient spent 66.5% with levels above 90%.    Review of the heart rate tracings revealed that this patient spent the recorded sleep time with heart rates between 48 and 106 bpm. The rhythm seems to have been sinus all throughout the night.      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    A trial of CPAP calibration to be performed in the sleep laboratory as soon as possible is recommended.     Thyroid function testing, if not already performed,  is also indicated.    Behavioral modifications, such as avoiding alcohol and sedatives at bedtime (as these are substances known to decrease upper airway dilator muscle tone and hence promote upper airway obstruction during sleep) and maintaining regular sleep-wake routine are also recommended.    Avoiding driving while sleepy, especially avoiding nighttime and long-distance driving, pending successful treatment of this condition is also strongly encouraged.     Thorough patient education regarding the relationship between obstructive sleep apnea/hypopnea syndrome and cardiovascular morbidity and mortality is strongly suggested as it may also contribute to this patient???s long-term compliance with the nasal mask.    A serious attempt at weight loss, preferably in a formal setting such as Weight Watchers, and consideration for surgical weight loss, should the conservative approach of diet and exercise fail, is strongly recommended.    These findings will be discussed with the patient during the follow up office visit.    These findings will also be forwarded to the treating physician, Dr. [name], who is thanked for this referral.          Georgena Spurling, MD, [facility], FAASM, D-ABSM  Director, [facility]  Medical Director, [facility]  Assistant Professor of Medicine  Division of Pulmonary, Critical Care and Sleep Medicine  [facility]

## 2008-10-24 NOTE — Unmapped (Signed)
Signed by Seymour Bars MA on 10/25/2008 at 11:55:56    PHONE NOTE - Patient Call    Caller's Cell Phone #: [phone]  Caller: patient  Department: Neurology  Call for: Letitia Libra    Reason for Call: Needs to speak to someone about her FMLA papers. Please call her @ [phone] before they are filled out.      Initial call taken by: Laney Pastor,  October 24, 2008 4:12 PM    Current Allergies:   ! COMPAZINE    FOLLOW UP  Left message to patient that forms already mailed to her  Follow-up by: Seymour Bars MA,  October 25, 2008 11:55 AM

## 2008-10-25 NOTE — Unmapped (Signed)
Signed by Lenn Sink on 10/29/2008 at 10:07:32    routed npsg results to Dr. Vanita Panda. called pt, gave results, her phone cut off, l/m to sch daytime cpap, pt expressed interest in 11/9.  ..................................................................Marland KitchenJanan Halter Schlensker  October 25, 2008 1:09 PM    Left message on voice mail for patient to call. Needs to schedule DCP. Chart placed in call roation.  ..................................................................Marland KitchenLenn Sink  October 29, 2008 10:07 AM

## 2008-11-07 NOTE — Unmapped (Signed)
Signed by Donia Guiles MA on 11/07/2008 at 14:13:35    PHONE NOTE - Patient Call    Call back at Home Phone: [phone]  Caller: patient  Department: Neurology  Call for: Letitia Libra    Reason for Call: She is having problems with her MS> Please call her @ [phone]      Initial call taken by: Laney Pastor,  November 07, 2008 1:21 PM    Current Allergies:   ! COMPAZINE    FOLLOW UP  Spoke with pt. She says yesterday she had pain all over and itching. She put some cortisone on her body but still had itching. It has subsided today. I informed her this was mor than likely not MS related and if occurs again to contact PCP. She understood.   phone call completed  Follow-up by: Megan Mans MA,  November 07, 2008 2:13 PM

## 2008-11-11 ENCOUNTER — Inpatient Hospital Stay

## 2008-11-11 NOTE — Unmapped (Signed)
Signed by Domingo Sep on 11/13/2008 at 15:22:16      DAYTIME POLYSOMNOGRAPHY REPORT WITH CPAP CALIBRATION     Patient Name :   Melanie [name], Melanie [name] Project :   [place] CPAP   Hospital # :  [id number] Subject Code :  1343/09   Study Date :   Nov. 16, 2009 Referring Physician :   [id number]   Sex :   D.O.B. :    Female  1956/12/23 Sleep Specialist :   Rollo Farquhar     This is a daytime polysomnogram performed on the evening of Nov-16-2009, on this 52 year-old female with a height of 63.0 inches and a weight of 240.0 pounds, for a BMI of 42.5 kg/m2 which is within morbid obesity range. This study was performed with positive airway pressure calibration as a follow-up from an initial polysomnography, which revealed sleep-related breathing disorder, in this patient with a history of HTN.    This study was performed on a Sandman?? Elite sleep system, monitoring right and left oculograms, C4-A1, C3-A2, O1-A2, O2-A1, thoracic and abdominal respiratory effort channels via piezoelectric method, EKG, pulse rate, arterial oxyhemoglobin saturation level via pulse oximetry, left and right anterior tibialis EMG, chin EMG, body position, video channel, positive airway pressure level and flow and snoring microphone.    The raw data was visually analyzed and scored according to AASM accepted criteria. Apnea is defined as cessation of airflow for at least 10 seconds; hypopnea is defined as at least a 50% reduction in airflow signal lasting at least 10 seconds, followed by at least a 3% decrease in oxyhemoglobin saturation or associated with an arousal.    Sleep Parameters:  Review of the electroencephalographic tracings revealed that this patient was studied for 293.8 minutes, of which 140.5 minutes were spent during sleep, for a sleep efficiency of 47.8%, which is decreased. This was mostly due to an early afternoon awakening. The sleep onset latency was 0.9 minutes.    The patient reached 1 REM sleep cycle during this study for REM  sleep latency from sleep onset of 72.0 minutes.    Of note, this study was technically adequate, as it did indeed record REM sleep in supine position.    Of the total sleep time, the patient spent 5.0% in sleep stage 1, 67.3% in sleep stage 2, 10.7% in slow wave sleep (stages 3 and 4 combined), and 17.1% in sleep stage REM.    There were no clinically significant periodic limb movements recorded during this study.    There were 12 spontaneous arousals recorded during this study, for a spontaneous arousal index of 5.1 events per hours.    There were 24 respiratory-effort related arousals recorded during this study for a RERAI of 10.2 events per hour.    Cardiorespiratory Parameters:  Review of the respiratory tracings revealed that this patient had 3 obstructive apneas recorded during this study, for an apnea-hypopnea index (AHI) 1.3 of events per hour. The REM sleep event index was 2.5 events per hour, and the NREM sleep event index was 1.0 events per hour.    Review of the oxyhemoglobin saturation tracings revealed that this patient reached a minimum level of 95.0% in REM sleep and 93.0% in NREM sleep.    This patient was studied with the addition of nocturnal CPAP. The pressure was delivered via a Swift LT for Her interface, size x-small. Heated humidification and C-Flex at 3 were used to improve the patient???s tolerance of the nasal mask. The  pressure was calibrated from an initial level of 5 cm H2O to a final level of 9 cm H2O. At the optimal level of pressure achieved, 9 cm H2O, this patient was studied for 198.2 minutes of which 1.5 minutes of REM sleep and 68.0 minutes of NREM sleep were recorded. The apnea-hypopnea index (AHI) at this level of pressure was 0.9 events per hour. The minimum oxyhemoglobin saturation at this level of pressure was 93.0%.    Review of the heart rate tracings revealed that this patient spent the recorded sleep time with heart rates between 67 and 95 bpm. The rhythm seems to have  been sinus all throughout the night.      Impressions & Recommendations:      This study demonstrates good control of this patient???s previously diagnosed sleep related breathing disorder while wearing a Swift LT for Her interface, size x-small at the optimal pressure of 9 cm H2O. Heated humidifier and C-Flex set for patient???s comfort are recommended in order to improve this patient???s tolerance and compliance with the nasal mask. Compliance with this regimen is strongly encouraged. Close cooperation with the home equipment company, as well as close clinical follow-up in an effort to address possible nasal or facial side effects, are all recommended. Additional measures, as recommended in the initial polysomnography report, are also suggested. Follow-up polysomnography within three to six months, should this patient???s symptoms recur or fail to completely resolve, is also indicated.    These findings will be discussed with the patient during the follow up office visit.    These findings will also be forwarded to the treating physician, Dr. [name], who is thanked for this referral.          Georgena Spurling, MD, [facility], FAASM, D-ABSM  Director, [facility]  Medical Director, [facility]  Assistant Professor of Medicine  Division of Pulmonary, Critical Care and Sleep Medicine  [facility]

## 2008-11-11 NOTE — Unmapped (Signed)
Signed by Georgena Spurling MD on 11/11/2008 at 00:00:00  Polysomnograph with CPAP      Imported By: Maryellen Pile 11/13/2008 16:20:18    _____________________________________________________________________    External Attachment:    Please see [facility] EMR for this document.

## 2008-11-13 NOTE — Unmapped (Signed)
Signed by Domingo Sep on 11/13/2008 at 15:46:12    routed cpap results to Dr. Vanita Panda. called pt, gave results. she did ok on cpap the first night, she thinks mask may be to large, she will contact eq co if she needs to change size.  told her to call if problems before ov on 2/12. sent appt letter  ..................................................................Marland KitchenJanan Halter Schlensker  November 13, 2008 3:46 PM

## 2008-11-14 NOTE — Unmapped (Signed)
Signed by Pilar Jarvis PA on 11/14/2008 at 08:38:21    Clinical Lists Changes    Problems:  Added new problem of OBSTRUCTIVE SLEEP APNEA (ICD-327.23)  Orders:  Added new Referral order of Treatment Orders ([id number]) - Signed

## 2008-11-14 NOTE — Unmapped (Signed)
Signed by Antony Blackbird MA on 11/14/2008 at 09:01:21    cpap rx was faxed to [facility] as noted per pink sheet.  ..................................................................Marland KitchenDineen Kid MA  November 14, 2008 9:01 AM

## 2008-11-25 NOTE — Unmapped (Signed)
Signed by Buel Ream CNP on 11/25/2008 at 15:19:44    Multiple Sclerosis Visit      History of Present Illness- Multiple Sclerosis:   Chief Complaint: f/u visit  Handedness: right  HPI: 52 yo AAF with RRMS since 2000.  Managed on Rebif injections.  Here for f/u appointment and review of most recent MRI on 6/09.  MRI brain showed mild progression of WML - no CEL. MRI C-Spine without evidence of demyelination.  Pt doing well with injections, but needs new autoinjector.  No side effects. Continues with fatigue.  Taking NuVigil as needed.  Sleep study confirmed sleep apnea;. Has been using CPAP for one week and said it is too drying and caused sore throat.  Denies urinary or bowel symptoms.  Due for eye exam and notices acuity  is deminished. Denies gait or balance problems.  [facility] than [facility] because pool was warmer.  Stopped going when pool water was changed and has not resumed   Past History  Past Medical History:  Asthma, Multiple Sclerosis, Dx 2000, Hypertension  Social History: Marital Status: divorced,   Employment Status: employed full-time,   Occupation: Teacher, music  Patient Lives at: apartment,   Alcohol Use: occasionally  Tobacco Usage:non-smoker  Passive Smoke Exposure: yes        Science writer Complaint: f/u visit  Handedness: right    Vital Signs   Height: 64 inches    BP #1: 132 / 78mm Hg   Allergies  ! COMPAZINE    Intake recorded by: Seymour Bars MA  November 25, 2008 2:14 PM      Diagnostic Testing:   MRI History 2004 per Dr Milas Kocher notation - noticeable black holes with moderate to severe lesion load.  Additional MRI History 6/09 MRI brain - mild increase in WML  no CEL  6/09 MRI C-spine - no WMD noted        Physical Exam-Neurology   Appearance: well-developed, well-nourished and in no acute distress.  Language/Speech: no aphasia and no dysarthria  Atten/concentration: awake and alert  Orientation: oriented x3  Memory: grossly intact  Fund of  Knowledge: grossly intact  Pupils: equal, round, reactive to light and accomodation  Visual Fields:   normal  CN 3,4,6 (EOM): extraocular movements intact, normal gaze alignment, and no nystagmus  Cranial Nerve 5 (Trigeminal): bilateral facial sensation normal  Cranial Nerve 7 (Facial): normal facial symmetry and strength  CN 8 (Auditory): proper hearing to finger rub bilaterally  Cranial Nerve 9 (Glossophar): soft palate elevates in the midline spontaneously  CN 11 (spinal access): full shoulder shrug equally  Cranial Nerve 12 (Hypoglossal): tongue is midline on protrusion    Muscle Strength     Right Upper Extremity   Shoulder abductor (right): 5/5  Elbow flexor (right): 5/5  Elbow extensor (right): 5/5  Wrist Flexors (right): 5/5  Wrist Extensors (right): 5/5  Finger Flexors (right): 5/5  Interosseous (right): 5/5  APB (right): 5/5    Left Upper Extremity   Shoulder abductor (left): 5/5  Elbow flexor (left): 5/5  Elbow extensor (left): 5/5  Wrist Flexors (left): 5/5  Wrist Extensors (left): 5/5  Finger Flexors (left): 5/5  Interosseous (left): 5/5  APB (left): 5/5    Right Lower Extremity   Hip Flexion (right): 5/5  Hip Adduction (right): 5/5  Hip Abduction (right): 5/5  Knee extension (right): 5/5  Knee Flexion (right): 5/5  Ankle Dorsi Flexor (right): 5/5  Pedal  Flexor (right): 5/5  Ankle Inverter (right): 5/5  Ankle Everter (right): 5/5    Left Lower Extremity   Hip Flexion (left): 5/5  Hip Adduction(left): 5/5  Hip Abduction (left): 5/5  Knee extension (left): 5/5  Knee Flexion (left): 5/5  Ankle Dorsi Flexor (left): 5/5  Pedal Flexors (left): 5/5  Ankle Inverter (left): 5/5  Ankle Everter (left): 5/5    Muscle Tone Examination     Right Upper Extremity   Right Upper Extremity: normal    Left Upper Extremity   Left Upper Extremity: normal    Right Lower Extremity   Right Lower Extremity: normal    Left Lower Extremity   Left Lower Extremity: normal    Reflexes     Right   Biceps (right): decreased  Triceps  (right): decreased  Brachio Radialis (right): decreased  Patellar (right): decreased  Ankle Jerk (right): decreased    Left   Biceps (left): decreased  Triceps (left): decreased  Brachio Radialis (left): decreased  Patellar (left): decreased  Ankle Jerk (left): decreased    Sensory Examination     Right Upper Extremity   Light Touch- Right Upper Extremity normal     Left Upper Extremity   Light Touch-Left Upper Extremity normal     Right Lower Extremity   Light Touch- Right Lower Extremity normal     Left Lower Extremity   Light Touch- Left Lower Extremity normal     Coordination/ Station & Gait:   Rapid alternating movements (right): normal  Finger to nose (right):normal  Rapid alternating movements (left):slow  Finger to nose (left):normal  Romberg: positive  Station/Gait: antalgic        Medications   New Prescriptions/Refills:  NUVIGIL 150MG  1 by mouth daily  #30 x 0, 11/25/2008, Seymour Bars MA    Assessment    52 yo AAF with RRMS on Rebif and stable MRI 6/09. Issues continue with   1 . fatigue- ongoing confirmed sleep apnea by sleep study  2.  inactivity/fatigue - responds well to aquatic therapy with increase mobility and strength         Plan    1. continue rebif - obtain CBC , Liver profile  2. Encouraged compliance with CPAP - has f/u appt in one month  3. NuVigil renewed with 30 day voucher  4. Rx for aquatic therapy  5. f/u 6 mos   Today's Orders   Other, Referral [phone]  99214 - Ofc Vst, Est Level IV [[id number]]    Medical Decision Making:   * review/order clinical lab tests  * review/order radiology tests    Patient Education:   Patient advised exercise >20 minutes 3xs per week .    Risks & Benefits:   * Risks, benefits and treatment options discussed with patient.    Is visit primarily counseling/coordination? yes  Risk of complications: moderate    Time spent with patient:   Established:    25 min  More than 50% of this time was spent discussing:   * Diagnosis  * Management  * Treatment  Plan      Prescriptions:  NUVIGIL 150MG  1 by mouth daily  #30 x 0   Entered by: Seymour Bars MA   Authorized by: Buel Ream CNP   Signed by: Seymour Bars MA on 11/25/2008   Method used: Print then Give to Patient   RxID: [id number]

## 2008-11-25 NOTE — Unmapped (Signed)
Signed by Seymour Bars MA on 11/25/2008 at 16:24:38    Clinical Lists Changes    Orders:  Added new Test order of CBC + plat + Diff  (1610) Lamont Dowdy) - Signed  Added new Test order of Liver function panel  (LIVP) (10256) ([id number]) - Signed        Process Orders  Not all tests in this order have been sent for requisitioning  Pending tests not yet sent for requisitioning:      11/25/2008: [facility]MR [facility] -- CBC + plat + Diff  (8119) [*[id number]] (signed)      11/25/2008: EMR [facility] -- Liver function panel  (LIVP) (10256) [[id number]] (signed)    Order mailed to patient

## 2008-12-02 NOTE — Unmapped (Signed)
Signed by Lyla Son on 12/02/2008 at 11:51:45      [facility]            [facility]    Healing ??? Teaching ??? Leading     [address], [address]  [place], [place] [address]  Ph:   [phone]  Fax: [phone]    November 25, 2008          [name], M.D.    RE: Melanie [name]   DOB:  18-Jun-1956    Dear Dr. Vanita Panda:     I had the pleasure of seeing your patient, Melanie [name], in consultation in the [facility] on 11/25/2008.     History of Present Illness:  52 year old female with relapsing-remitting multiple sclerosis since 2000.  Managed on Rebif injections.  Here for follow-up appointment and review of most recent MRI done on 6/09.  MRI brain showed mild progression of white matter lesions, no contrast-enhancing lesions. MRI C-spine without evidence of demyelination.    Patient doing well with injections, but needs new autoinjector.  No side effects. Continues with fatigue.  Taking NuVigil as needed.    Sleep study confirmed sleep apnea. Has been using CPAP for one week and said it is too drying and caused sore throat.    Denies urinary or bowel symptoms.  Due for eye exam and notices acuity is diminished. Denies gait or balance problems.    [facility] than [facility] because pool was warmer.  Stopped going when pool water was changed and has not resumed.    Impression:    52 year old AAF with RRMS on Rebif and stable MRI in 6/09. Issues continue with   1. Fatigue- ongoing, sleep apnea confirmed by sleep study.  2. Inactivity/fatigue - responds well to aquatic therapy with increase mobility and strength.     Plan:  1. Continue Rebif - obtain CBC , liver profile.  2. Encouraged compliance with CPAP - has follow-up appointment in one month.  3. NuVigil renewed with 30 day voucher.  4. Rx for aquatic therapy.  5. Followup in 6 months.    Thank you for allowing me to participate in the care of this pleasant patient.  If you would  like a copy of my office note, please contact our Medical Records department at [phone].  Please feel free to contact me should you have any concerns or questions.    Sincerely,    Letitia Libra, CNP

## 2009-01-09 NOTE — Unmapped (Signed)
Signed by Seymour Bars MA on 01/09/2009 at 15:35:10    PHONE NOTE - Patient Call    Caller: patient  Department: Neurology  Call for: [facility]    Reason for Call: aqua therapy script was written but the office was closed for 2 weeks at Christmas and New Years  and pt couldn't get in before Jan.  Needs a new script faxed to [facility] .pt will call back with fax #. pt can be reached at [phone]       Initial call taken by: [facility] Rippy,  January 09, 2009 3:08 PM      New Orders:  Other, Referral [[id number]]  FOLLOW Lubertha Basque fax # [phone]. and please call Tameshia when faxed at [phone]. she would it done today if possible.  Follow-up by: Juliette Alcide Rippy,  January 09, 2009 3:13 PM    ADDITIONAL FOLLOW UP  Order faxed-patient notified  Follow-up by: Seymour Bars MA,  January 09, 2009 3:35 PM

## 2009-01-15 ENCOUNTER — Inpatient Hospital Stay

## 2009-01-15 NOTE — Unmapped (Signed)
Signed by Seymour Bars MA on 01/15/2009 at 15:27:53    PHONE NOTE - Other Incoming Call      Call From: [facility]  Caller Phone #: [phone]  Call For: Letitia Libra  [facility] Name: Ander Slade  Needs to have PT added to rx and faxed to [phone].    Initial call taken by: Laney Pastor,  January 15, 2009 3:23 PM      New Orders:  Physical Therapy Evaluation [[id number]]  FOLLOW UP  fAXED  Follow-up by: Seymour Bars MA,  January 15, 2009 3:26 PM

## 2009-01-17 NOTE — Unmapped (Signed)
Signed by Seymour Bars MA on 01/17/2009 at 14:42:47    PHONE NOTE - Patient Call    Call back at Home Phone: [phone]  Caller: patient  Department: Neurology  Call for: Letitia Libra    Reason for Call: Needs to speak to someone asap about FMLA papers, her HR dept is telling her they did not received them. Call her @ [phone]      Initial call taken by: Laney Pastor,  January 17, 2009 1:53 PM    Current Allergies:   ! COMPAZINE    FOLLOW UP  Patient will be sending another FMLA forms to be filled out  Follow-up by: Seymour Bars MA,  January 17, 2009 2:41 PM

## 2009-01-21 NOTE — Unmapped (Signed)
Signed by Nestor Lewandowsky MD on 01/21/2009 at 00:00:00  FMLA      Imported By: Betsey Amen 01/27/2009 10:19:18    _____________________________________________________________________    External Attachment:    Please see [facility] EMR for this document.

## 2009-01-27 NOTE — Unmapped (Signed)
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)  SCLEROSIS, MULTIPLE (ICD-340)    Medications:   REBIF 44 MCG/0.5ML SOLN (INTERFERON BETA-1A) 1 injection TIW  LISINOPRIL-HYDROCHLOROTHIAZIDE 20-25 MG TABS (LISINOPRIL-HYDROCHLOROTHIAZIDE) 1 by mouth daily  IBUPROFEN 800 MG  TABS (IBUPROFEN) 1 tab by mouth every 8 hours as needed Headache  MEDROL (PAK) 4 MG  TABS (METHYLPREDNISOLONE) take as directed  PROAIR HFA 108 (90 BASE) MCG/ACT  AERS (ALBUTEROL SULFATE) 2 puffs every 4-6 hours as needed  AEROCHAMBER PLUS   MISC (SPACER/AERO-HOLDING CHAMBERS) use with MDi  PROMETHAZINE-CODEINE 6.25-10 MG/5ML  SYRP (PROMETHAZINE-CODEINE) 1 tsp by mouth every 4-6 hours as needed coughing  AUGMENTIN 875-125 MG  TABS (AMOXICILLIN-POT CLAVULANATE) 1 tab by mouth bid  ATIVAN 1 MG  TABS (LORAZEPAM) take 1 by mouth 1 hour before scan 1 by mouth right before scan  * NUVIGIL 150MG  1 by mouth daily  FLOVENT HFA 110 MCG/ACT AERO (FLUTICASONE PROPIONATE  HFA) 2 puffs every 12 hours  PREDNISONE 20 MG TABS (PREDNISONE) take 3 tabs by mouth daily for 3 days, 2 tabs by mouth daily for 2 days 1 tab by mouth daily for 2 days then 1/2 tab by mouth daily  PROVIGIL 200 MG TABS (MODAFINIL)       Allergies:  ! COMPAZINE    Primary Care Provider: [name], MD      NPSG:     Date of NPSG:   10/21/2008  SE %:   77.1  AHI:   20.6 /hr  Lowest p ox %:   78.0  NPSG performed at:   [facility]    CPAP:     CmH2O:   9    Past History  Social History: Language: English,    Marital Status: divorced,   Employment Status: employed full-time,   Occupation: Teacher, music  Patient Lives at: apartment,   Support System: adequate  Caffeine per Day: 2  Alcohol Use: occasionally  Drug Use: none  Tobacco Usage:non-smoker  Passive Smoke Exposure: yes            ]

## 2009-01-28 NOTE — Unmapped (Signed)
Signed by Seymour Bars MA on 01/28/2009 at 14:51:53    PHONE NOTE - Patient Call    Caller: patient  Department: Neurology  Call for: [facility]    Reason for Call: wants to speak [name] about FMLA. Please call pt back and if she's not available, try later at [phone]. thanks       Initial call taken by: Juliette Alcide Rippy,  January 28, 2009 11:46 AM      FOLLOW UP  FMLA forms faxed to [employer]  Follow-up by: Seymour Bars MA,  January 28, 2009 2:51 PM

## 2009-02-07 NOTE — Unmapped (Signed)
Signed by   LinkLogic on 02/07/2009 at 12:32:34    Appointment status changed to no show by  LinkLogic on 02/07/2009 12:32 PM.    No Show Comments  ----------------  (SLU) SLEEP APNEA    Appointment Information  -----------------------  Appt Type:         Date:  Friday, February 07, 2009       Time:  11:15 AM for 45 min    Urgency:  Routine    Made By:  Marlowe Sax   To Visit:  Otelia Santee M PA     Reason:  Freada Bergeron) SLEEP APNEA    Appt Comments  -------------  -- 02/07/09 12:32: ([facility]) NO SHOW --  (SLU) SLEEP APNEA  npsg before ov  WAS ON DR. Kathie Rhodes [facility] FOR SAME TIME  Referred by: Janace Hoard - [phone] - [phone]

## 2009-02-11 NOTE — Unmapped (Signed)
Signed by Nestor Lewandowsky MD on 02/11/2009 at 00:00:00  [facility]      Imported By: Kathlynn Grate 04/04/2009 15:34:52    _____________________________________________________________________    External Attachment:    Please see [facility] EMR for this document.

## 2009-02-11 NOTE — Unmapped (Signed)
Signed by Buel Ream CNP on 02/11/2009 at 00:00:00  PT Reports      Imported By: Betsey Amen 02/23/2009 09:28:05    _____________________________________________________________________    External Attachment:    Please see [employer] EMR for this document.

## 2009-03-11 NOTE — Unmapped (Signed)
Signed by Nestor Lewandowsky MD on 03/11/2009 at 00:00:00  PT Reports      Imported By: Betsey Amen 03/18/2009 15:10:17    _____________________________________________________________________    External Attachment:    Please see [facility] EMR for this document.

## 2009-03-18 NOTE — Unmapped (Signed)
Signed by Nestor Lewandowsky MD on 03/18/2009 at 00:00:00  PT Reports      Imported By: Betsey Amen 03/27/2009 11:54:45    _____________________________________________________________________    External Attachment:    Please see [facility] EMR for this document.

## 2009-03-25 NOTE — Unmapped (Signed)
Signed by Nestor Lewandowsky MD on 03/25/2009 at 00:00:00  [employer]       Imported By: Betsey Amen 04/06/2009 09:11:21    _____________________________________________________________________    External Attachment:    Please see [facility] EMR for this document.

## 2009-03-31 NOTE — Unmapped (Signed)
Signed by Simeon Craft MD on 03/31/2009 at 21:11:41      Reason for Visit   Chief Complaint: possible rash on neck painfull itching noticed yesterday     History from: patient    Allergies  ! COMPAZINE    Medications     Vital Signs:   Wt: 236 lbs.      BMI: 40.66  BSA: 2.10  Wt chg (lbs): -6  Temperature: 98.6  degrees F  oral    Patient appears to be in acute distress: no  BP: 114/76  Cuff size: large    Intake recorded by: Ebony Cargo MA on March 31, 2009 2:46 PM    History of Present Illness   Chief Complaint: itching and reaction on back of neck  States was home yesterday, had been outdoor grilling on deck and began itching in back of neck. Not sure if bit by anything.          Physical Examination:   BP: 114/  76    Physical Exam- Detail:   Skin: posterior neck with an area of erythema, wheal about 3cm and extends inferior. centrally there appears to be bite site.           New Problems:  ALLERGY TO INSECTS AND ARACHNIDS (ICD-V15.06)  New Medications:  BACTRIM DS 800-160 MG TABS (SULFAMETHOXAZOLE-TRIMETHOPRIM) 1 tab by mouth bid      Preventive Maintenance       Coordinating Care Providers   PCP Name: [name], MD            Prescriptions:  BACTRIM DS 800-160 MG TABS (SULFAMETHOXAZOLE-TRIMETHOPRIM) 1 tab by mouth bid  #20 x 0   Entered and Authorized by: Simeon Craft MD   Signed by: Simeon Craft MD on 03/31/2009   Method used: Print then Give to Patient   RxID: [id number]      Assessment and Plan     Problems   Status of Existing Problems:  Assessed ALLERGY TO INSECTS AND ARACHNIDS as comment only - Simeon Craft MD  New Problems:  Dx of ALLERGY TO INSECTS AND ARACHNIDS (ICD-V15.06)  Onset: 03/31/2009    Medications   New Prescriptions/Refills:  BACTRIM DS 800-160 MG TABS (SULFAMETHOXAZOLE-TRIMETHOPRIM) 1 tab by mouth bid  #20 x 0, 03/31/2009, Simeon Craft MD    Today's Orders   [phone] - Ofc Vst, Est Level III [NFA-21308]    Patient Instructions   insect bite with possible cellulitis,  cont cool compresses for comfort and beadryl every 6 hours

## 2009-04-03 NOTE — Unmapped (Signed)
Signed by Buel Ream CNP on 04/03/2009 at 00:00:00  Discharge Summary      Imported By: Coletta Memos 04/13/2009 21:01:42    _____________________________________________________________________    External Attachment:    Please see [employer] EMR for this document.

## 2009-04-09 NOTE — Unmapped (Signed)
Signed by Seymour Bars MA on 04/09/2009 at 12:23:31    PHONE NOTE  Caller's Cell Phone #: [phone]  Caller: patient  Department: Neurology  Call for: Laural Benes    Reason for Call: refill: rebif, uses [employer] [employer], fax # [phone]. pt also needs a call back, her FMLA needs corrected      Initial call taken by: Juliette Alcide Rippy,  April 09, 2009 11:07 AM      FOLLOW UP  Patient will be sending over another FMLA form to be redone states that there should not have the amount of days that she would be off work.  Needs to be undetermined  Follow-up by:  Seymour Bars MA,  April 09, 2009 11:21 AM    FOLLOW UP  aware   Follow-up by:  Buel Ream CNP,  April 09, 2009 11:53 AM    Prescriptions:  REBIF 44 MCG/0.5ML SOLN (INTERFERON BETA-1A) 1 injection TIW  #36 x 3   Entered by: Seymour Bars MA   Authorized by: Nestor Lewandowsky MD   Signed by: Seymour Bars MA on 04/09/2009   Method used: Printed then faxed to ...     [facility]     [address]     [place], [place]  [address]     Ph: [phone]     Fax: [phone]   RxID: [id number]

## 2009-04-21 NOTE — Unmapped (Signed)
Signed by Ebony Cargo MA on 04/21/2009 at 17:09:46      Work Excuse   This patient was seen in the office 04/21/2009.  May not return to work until 04/22/2009.  Comments: Please excuse from work 04-20-09 04-21-09 , may return 04-22-09.

## 2009-04-21 NOTE — Unmapped (Signed)
Signed by Simeon Craft MD on 04/21/2009 at 17:53:43      Reason for Visit   Chief Complaint: flu like symptoms cough congestion dizziness fatigue     History from: patient    Allergies  ! COMPAZINE    Medications     Vital Signs:   Wt: 236 lbs.      BMI: 40.66  BSA: 2.10  Wt chg (lbs): 0  Temperature: 98.7  degrees F  oral    Patient appears to be in acute distress: no  BP: 118/84  Cuff size: large    Intake recorded by: Ebony Cargo MA on April 21, 2009 4:00 PM    History of Present Illness   Chief Complaint: coughing that won't quit  since her shott last thursday for MS had back pain and cough, congestion. Used heat rub for back.  Phlegm is deep yellow. Gooey. NOt using CPAP.          Physical Examination:   BP: 118/  84    Physical Exam- Detail:   General Appearance: well-developed, well-nourished and in no acute distress. coughing  Respiratory: no rales. occ rhonchi, cleared with cough and faint wheeze good excursions  Neck: No thyromegaly.  No nodules, masses or tenderness.  Lymphatic: Areas palpated not enlarged:  cervical, supraclavicular.  Cardiac: S1 and S2 normal.  RRR without murmurs, rubs, gallops.  No JVD.  Neurologic: alert and oriented.  Psychiatric: Judgement and insight are within normal limits.  Alert and oriented x3.  No mood disorders noted, appropriate affect.          Preventive Maintenance       Coordinating Care Providers   PCP Name: [name], MD            Prescriptions:  AUGMENTIN 875-125 MG  TABS (AMOXICILLIN-POT CLAVULANATE) 1 tab by mouth bid  #20 x 0   Entered and Authorized by: Simeon Craft MD   Signed by: Marlana Salvage [id number] MD on 04/21/2009   Method used: Print then Give to Patient   RxID: [id number]  PROAIR HFA 108 (90 BASE) MCG/ACT  AERS (ALBUTEROL SULFATE) 2 puffs every 4-6 hours as needed  #1 x 0   Entered and Authorized by: Simeon Craft MD   Signed by: Marlana Salvage [id number] MD on 04/21/2009   Method used: Print then Give to  Patient   RxID: [id number]  PROMETHAZINE-CODEINE 6.25-10 MG/5ML  SYRP (PROMETHAZINE-CODEINE) 1 tsp by mouth every 4-6 hours as needed coughing  #4 oz x 0   Entered and Authorized by: Simeon Craft MD   Signed by: Marlana Salvage [id number] MD on 04/21/2009   Method used: Print then Give to Patient   RxID: [id number]      Assessment and Plan     Problems   Status of Existing Problems:  Assessed BRONCHITIS, ACUTE WITH BRONCHOSPASM as comment only - mild bronchospasm - Simeon Craft MD - Signed    Medications   New Prescriptions/Refills:  AUGMENTIN 875-125 MG  TABS (AMOXICILLIN-POT CLAVULANATE) 1 tab by mouth bid  #20 x 0, 04/21/2009, Simeon Craft MD  PROAIR HFA 108 (90 BASE) MCG/ACT  AERS (ALBUTEROL SULFATE) 2 puffs every 4-6 hours as needed  #1 x 0, 04/21/2009, Simeon Craft MD  PROMETHAZINE-CODEINE 6.25-10 MG/5ML  SYRP (PROMETHAZINE-CODEINE) 1 tsp by mouth every 4-6 hours as needed coughing  #4 oz x 0, 04/21/2009, Simeon Craft MD    Today's Orders   [phone] - Ofc Vst,  Est Level III [[id number]]    Patient Instructions   1 HHN given prior to discharge with some subjective improvment

## 2009-05-09 NOTE — Unmapped (Signed)
Signed by Ebony Cargo MA on 05/09/2009 at 08:09:07    Prescriptions:  LISINOPRIL-HYDROCHLOROTHIAZIDE 20-25 MG TABS (LISINOPRIL-HYDROCHLOROTHIAZIDE) 1 by mouth daily  #30 x 6   Entered by: Ebony Cargo MA   Authorized by: Simeon Craft MD   Signed by: Ebony Cargo MA on 05/09/2009   Method used: Handwritten   RxID: [id number]

## 2009-07-07 NOTE — ED Provider Notes (Unsigned)
PATIENT NAME                  PA #             MR #                  Danielle Miles, Danielle Miles                  [id number]       [id number]            EMERGENCY ROOM PHYSICIAN                 ADM DATE                     Marguerite Olea, MD                  07/07/2009                   DATE OF BIRTH    AGE            PATIENT TYPE      RM #               [birth year]       52             ERK                                     CHIEF COMPLAINT:  I have a contact in my eye.     HISTORY OF PRESENT ILLNESS:  She has a contact stuck in her right eye.  She  reports severe 8/10 pain in the right eye.  She states she thinks the contact  got stuck in there.     REVIEW OF SYSTEMS:  She denies any fevers, chills, nausea, or diaphoresis.     ALLERGIES:  COMPAZINE.     PAST MEDICAL HISTORY:  1.  Asthma.  2.  Hypertension.  3.  Previous cancer.     SOCIAL HISTORY:  She does drink alcohol occasionally.  No tobacco or drug  use.     IMMUNIZATIONS:  She is up-to-date with tetanus.     PHYSICAL EXAMINATION:  VITAL SIGNS:  Blood pressure 171/91, pulse 85, respiratory rate 20, and  temperature 99.9.  Saturation is 98%.  HEENT:  The right eye shows no injection.  I do not see an obvious contact.   Extraocular movements intact.  Pupils are equal and reactive bilaterally.   Sensation is symmetrical.  Pulse is symmetrical.  No foreign substance on  examination.     MEDICAL DECISION MAKING:  There is no obvious contact in the eye.  When I  stained it looks like she has a large corneal abrasion at 6 oclock position,  but no foreign body noted in the eye.  I think the corneal abrasion is what  she is feeling now.  I everted the lid and I do not see any foreign body in  there at all.  She was placed on antibiotic drops, quinolone, given that she  is using contacts to cover for pseudomonas.  We will update her tetanus.  She  is not to use her contacts for about a week and follow up with ophthalmology  on call.     FINAL IMPRESSION:  Corneal abrasion  secondary to contact lens right eye.  [name], MD     Eustaquio Maize /[id number]  DD: 07/07/2009 23:17  DT: 07/08/2009 15:28  Job #: [id number]  CC:

## 2009-07-10 NOTE — Unmapped (Signed)
Signed by Seymour Bars MA on 07/10/2009 at 15:52:10    PHONE NOTE  Call back at Home Phone: [phone]  Caller: patient  Department: Neurology  Call for: [facility]    Reason for Call: wants samples of nuvigil, pt wants to pick them up TODAY, PT IS OUT MEDS      Initial call taken by: Juliette Alcide Rippy,  July 10, 2009 3:04 PM      New Medications:  NUVIGIL 250 MG TABS (ARMODAFINIL) 1 by mouth qam    FOLLOW UP  Samples up front for pick up  Follow-up by:  Seymour Bars MA,  July 10, 2009 3:51 PM    Prescriptions:  NUVIGIL 250 MG TABS (ARMODAFINIL) 1 by mouth qam  #30 x 3   Entered by: Seymour Bars MA   Authorized by: Buel Ream CNP   Signed by: Seymour Bars MA on 07/10/2009   Method used: Print then Give to Patient   RxID: [id number]

## 2009-07-21 NOTE — Unmapped (Signed)
Signed by Seymour Bars MA on 07/21/2009 at 13:22:35    PHONE NOTE  Call back at Home Phone: [phone]  Caller's Cell Phone #: [phone]  Caller: patient  Department: Neurology  Call for: Staceyann Knouff    Reason for Call: pt needs nuvigil samples (the smaller dosage) pt will come to office to pick them up      Initial call taken by: Melinda Rippy,  July 21, 2009 1:08 PM      FOLLOW UP  Samples up front for pick up  Follow-up by:  Seymour Bars MA,  July 21, 2009 1:22 PM

## 2009-08-07 NOTE — Unmapped (Signed)
Signed by Seymour Bars MA on 08/07/2009 at 14:54:31    PHONE NOTE  Caller's Cell Phone #: [phone]  Caller: patient  Department: Neurology  Call for: Dr Grayland Jack    Reason for Call: Wants to know if there are any lower dose samples of Nuvigil. Call her @ [phone] or [phone]      Initial call taken by: Laney Pastor,  August 07, 2009 2:50 PM      FOLLOW UP  Samples will be mailed to patient  Follow-up by:  Seymour Bars MA,  August 07, 2009 2:54 PM

## 2009-08-17 NOTE — ED Provider Notes (Unsigned)
PATIENT NAME                  PA #             MR #                  PATTIE, FLAHARTY                  [id number]       [id number]            EMERGENCY ROOM PHYSICIAN                 ADM DATE                     Diania Co Neal Dy, MD                       08/17/2009                   DATE OF BIRTH    AGE            PATIENT TYPE      RM #               Jul 29, 1956       52             ERK                                     ATTENDING PHYSICIAN:  Dr. Vanita Panda.     CHIEF COMPLAINT:  Foot pain.     HISTORY OF PRESENT ILLNESS:  53 year old female presents to the emergency  room with injury to her left foot.  Tripped over something at work, a cooler  at work yesterday.  I am not sure how or what she did to her foot, but there  is a lot of pain and discomfort trying to walk on it.  No other complaints.     PHYSICAL EXAMINATION:  GENERAL APPEARANCE:  The patient is alert, oriented, in no acute distress.   EXTREMITIES:  Left foot tenderness along the plantar arch.  No obvious  deformity.  No hematoma formation.  Ankle was nontender.  Peripheral  neurovascular was intact.     EMERGENCY ROOM COURSE:  A left foot x-ray was obtained which showed no  fracture.       ASSESSMENT:  Foot sprain.     PLAN:  Ice, elevation, ACE wrap, Vicodin.  Follow up with orthopedist if not  improving.                                 Taia Bramlett Letha Cape, MD     [phone]  DD: 08/17/2009 11:39  DT: 08/20/2009 02:16  Job #: [id number]  CC:

## 2009-09-08 NOTE — Unmapped (Signed)
Signed by Seymour Bars MA on 09/09/2009 at 11:28:28    PHONE NOTE  Call back at Home Phone: [phone]  Caller: patient  Department: Neurology  Call for: Adrain Nesbit    Reason for Call: pt out of medicine - do we have samples and even if we have lower dosage or previous medicine taken - she can p/u today      Initial call taken by: Young Berry,  September 08, 2009 3:24 PM      FOLLOW UP  pt called again re: samples - must leave by 4:10 to p/u by 4:30, plz call asap  Follow-up by:  Young Berry,  September 08, 2009 3:58 PM    FOLLOW UP  Left message samples mailed  Follow-up by:  Seymour Bars MA,  September 09, 2009 11:28 AM

## 2009-09-09 NOTE — Unmapped (Signed)
Signed by Seymour Bars MA on 09/09/2009 at 13:36:16    PHONE NOTE  Call back at Home Phone: [phone]  Caller's Cell Phone #: [phone]  Caller: patient  Department: Neurology  Call for: Shivansh Hardaway    Reason for Call: pt wants to know if she can pick up nuvigil samples - she is completely out and when I told her the samples were mailed today - pt still wanted to know if she could come to the office to pick some up      Initial call taken by: Juliette Alcide Rippy,  September 09, 2009 1:33 PM      FOLLOW UP  Patient advised no samples left.  Was mailed out this am  Follow-up by:  Seymour Bars MA,  September 09, 2009 1:36 PM

## 2009-10-01 NOTE — Unmapped (Signed)
Signed by Tiburcio Bash MA on 10/01/2009 at 17:37:42    PHONE NOTE  Call back at Home Phone: [phone]  Caller: patient  Call for: AD    Reason for Call: Patient needs a note saying she needs C-PAP machine supplies faxed to (will be calling with number) sent today so she can get the money back from her flexible spending account. Please call.      Current Allergies:   ! COMPAZINE    FOLLOW UP  clarify  Follow-up by:  Simeon Craft MD,  October 01, 2009 2:39 PM    FOLLOW UP  FSA FAX:  [phone]     She submitted  receipts to her [employer] manager([employer])  for reimbursement for her C-pap machine supplies-tubes and masks totaling  $88.21.They denied it stating that  they only cover things prescribed by a doctor. Patient wants you to fax a letter stating that the cpap machine is prescribed by a doctor- the deadline for her to get reimbursed this week in noon tomorrow. Patient wants a call letting her know if/when this is done.  Follow-up by:  Rudi Coco,  October 01, 2009 3:21 PM    FOLLOW UP  note faxed  Follow-up by:  Tiburcio Bash MA,  October 01, 2009 5:37 PM

## 2009-10-01 NOTE — Unmapped (Signed)
Signed by Simeon Craft MD on 10/01/2009 at 16:10:24        [facility]   [address]   [place], [place] [address]   [phone]  Fax: [phone]           October 01, 2009      Re:     Melanie [name]   DOB:  August 25, 1956      To Whom It May Concern:                     Ms. Bun has a CPAP machine that was prescribed by the Sleep Medicine specialist (Pulmonologist)whom I referred her to. Her supplies are also prescribed by this doctor.             Sincerely,          Simeon Craft MD

## 2009-10-06 NOTE — Unmapped (Signed)
Signed by Seymour Bars MA on 10/07/2009 at 00:93:81    PHONE NOTE  Call back at Home Phone: [phone]  Caller: patient  Department: Neurology  Call for: [facility]    Reason for Call: pt out of med, do we have samples of nuvigil and pt can p/u       Initial call taken by: Young Berry,  October 06, 2009 1:43 PM      FOLLOW UP  pt called again, pt is out of meds and she wants to come to the office to pick up the samples, call pt at  [phone]  Follow-up by:  Juliette Alcide Rippy,  October 06, 2009 3:09 PM    FOLLOW UP  Patient advised samples ready for pick up  Follow-up by:  Seymour Bars MA,  October 07, 2009 8:46 AM

## 2009-10-29 NOTE — Unmapped (Signed)
Signed by Simeon Craft MD on 10/29/2009 at 15:34:25        [facility]   [address]   [place], [place] [address]   [phone]  Fax: [phone]           October 29, 2009      Re:     Melanie [name]   DOB:  [birth year]      To Whom It May Concern:                                  Please grant waiver to Ms. Melanie [name] and exempt her from getting flu shot as she has had adverse reactions to it in the past.          Sincerely,          Simeon Craft MD

## 2009-10-29 NOTE — Unmapped (Signed)
Signed by Tiburcio Bash MA on 10/29/2009 at 16:47:21    PHONE NOTE  Call back at Home Phone: [phone]  Caller's Cell Phone #: [phone]  Caller: patient  Call for: AD    Reason for Call: Patient works for [employer] and is required to get a flu shot- patient says she gets very ill every year following the flu shot.  Last year had trouble breathing following it.  Patient says she has MS and and asthma and would like to be excused from taking the flu shot.  Would you be willing to write it for her?  Please call and let her know.      Initial call taken by: [name] Slabaugh,  October 29, 2009 3:24 PM      FOLLOW UP  OK  Follow-up by:  Simeon Craft MD,  October 29, 2009 3:32 PM    FOLLOW UP  pt notified note is ready for pick up  Follow-up by:  Tiburcio Bash MA,  October 29, 2009 4:47 PM

## 2009-12-01 NOTE — Unmapped (Signed)
Signed by Seymour Bars MA on 12/01/2009 at 16:16:48    PHONE NOTE  Call back at Home Phone: [phone]  Caller: patient  Department: Neurology  Call for: [facility]    Reason for Call: nuvigil samples      Initial call taken by: Young Berry,  December 01, 2009 4:01 PM      FOLLOW UP  Samples  ready for pick up  Follow-up by:  Seymour Bars MA,  December 01, 2009 4:16 PM

## 2009-12-04 LAB — URINALYSIS W/RFL TO MICROSCOPIC
Bilirubin Urine: NEGATIVE
Blood, UA: NEGATIVE
Glucose, UA: NEGATIVE g/dL
Ketones, UA: NEGATIVE
Leukocyte Esterase, UA: NEGATIVE
Nitrite, UA: NEGATIVE
Protein, UA: NEGATIVE
Specific Gravity, UA: 1.03 (ref 1.005–1.030)
Urobilinogen, UA: 0.2 (ref 0–1)
pH, UA: 6 (ref 5.0–7.5)

## 2009-12-04 NOTE — Unmapped (Signed)
Signed by Buel Ream CNP on 12/08/2009 at 10:05:25  Patient: Melanie [name]  Note: All result statuses are Final unless otherwise noted.    Tests: (1) Urinalysis, Routine ([id number])    Order Note: Performing Site: [facility] [address]   [place] [address].    Urine-Color               LT.YLW                      (STRAW-YELLOW)    Appearance                CLEAR                       (CLEAR-)    Specific Gravity          1.030                       (1.005-1.030)    pH                        6.0                         (5.0-7.5)    Glucose                   NEGATIVE mg/dL              (NEGATIVE-)    Protein                   NEGATIVE mg/dL              (NEGATIVE-TRACE)    Occult Blood              NEGATIVE                    (NEGATIVE-)    Bilirubin                 NEGATIVE                    (NEGATIVE-)    Urobilinogen,Semi-Qn      0.2 mg/dL                   (0-1)    Nitrite, Urine            NEGATIVE                    (NEGATIVE-)    Ketones                   NEGATIVE mg/dL              (NEGATIVE-)    WBC Esterase              NEGATIVE                    (NEGATIVE-)    Note: An exclamation mark (!) indicates a result that was not dispersed into   the flowsheet.  Document Creation Date: 12/04/2009 4:41 PM  _______________________________________________________________________    (1) Order result status: Final  Collection or observation date-time: 12/04/2009 10:23  Requested date-time: 12/04/2009 10:23  Receipt date-time: 12/04/2009 10:23  Reported date-time: 12/04/2009 16:25:00  Referring Physician:    Ordering Physician:  Hulan Fess ([email or web address])  Specimen Source:  Source: Leverne Humbles Order Number: [id number])  Lab site:       -----------------    The following non-numeric lab results were dispersed to  the flowsheet even though numeric results were expected:      Glucose, NEGATIVE

## 2009-12-04 NOTE — Unmapped (Signed)
Signed by Nestor Lewandowsky MD on 12/09/2009 at 16:59:54    Multiple Sclerosis Visit      History of Present Illness- Multiple Sclerosis:   Handedness: right  HPI: I had the pleasure of seeing Melanie [name] in multiple sclerosis clinic on 12/04/2009. The patient is a 53 year old African-American female with the diagnosis of relapsing-remitting multiple sclerosis. She was previously followed by Dr. Cherylann Banas who diagnosed her with RRMS.     Reportedly she has had symptoms since about the year 2000 when she was noticing forgetfulness, forgetting directions and getting lost easily. At that time she also started to notice worsening fatigue. She also was diagnosed with obstructive sleep apnea and is now on CPAP. Subsequently she had episodes of dizziness which she described as a spinning sensation. She also had previous episodes of optic neuritis. She was started on Rebif which she has continued until now.     According to the patient, she was doing pretty well until recently when she noticed that her vision is not as good as it used to be. She has noticed problems especially with the right eye. She has also developed painful muscle spasms and severe pain in the left leg which is causing her to limp. She continues to complain of short-term memory problems and fatigue as well as the muscle spasms. Her bladder functions pretty well; however, she says she has double voiding. She  has no significant bowel problems.     On testing of visual acuity, her right eye was 20/200 and left 20/40.      x  x     PAST HISTORY  Past Medical History (reviewed - no changes required):  Asthma, Multiple Sclerosis, Dx 2000, Hypertension  Surgical History (reviewed - no changes required):  Hysterectomy: Partial , Knee surgery  Appendix     Family History (reviewed - no changes required): Laryngeal cancer, stomach cancer, skin cancer, brain cancer,   Sister with diabetes   aloholism-father, uncles  Social History (reviewed - no changes required):  Language: English,   Marital Status: divorced,   Employment Status: employed full-time,   Occupation: Teacher, music  Patient Lives at: apartment,   Support System: adequate  Caffeine per Day: 2  Alcohol Use: occasionally  Drug Use: none  Tobacco Usage:non-smoker  Passive Smoke Exposure: yes    Review of Systems   General: Denies fevers, chills, sweats, anorexia, fatigue, malaise, weight loss, weight gain, drowsiness, insomnia.   Eyes: Complains of blurring, vision loss. Denies diplopia, irritation, discharge, eye pain, photophobia, redness.   Ears/Nose/Throat: Denies decreased hearing, dysphagia, ear discharge, earache, facial pain, headaches, hoarsness, mouth ulcers/sores, nasal congestion, nasal ulcers/sores, nosebleeds, post nasal drip, rhinorrhea, sinus pain, sinusitis, sneezing, sore throat, tinnitus.   Cardiovascular: Complains of dyspnea on exertion. Denies chest pains, dyspnea on exertion. winded after clinbing stairs from parking lot to work    Respiratory: Complains of dyspnea, wheezing. Denies tachypnea, excessive sputum, hemoptysis, productive cough, dry cough, choking, clubbing, sputum, stridor, exercise limitation, chest wall deformity, frequent throat clearing, chest pain, cyanosis, nocturnal cough, exercise-induced cough, TB exposure.   Gastrointestinal: Denies nausea, vomiting, diarrhea, constipation, change in bowel habits, abdominal pain, melena, hematochezia, jaundice, spitting, encopresis, hematemesis, abdominal distention, edema, ascites.   Genitourinary: Complains of urinary frequency, incomplete empty.   Neurologic: Complains of headache, weakness, poor coordination, word finding problems, concentration problems, memory problems, numbness, tingling. Denies transient paralysis, paresthesias, seizures, syncope, tremors, vertigo, ataxia, gait difficulties, falls, dizziness.       Intake-Neurology  Handedness: right    Vital Signs   Height: 64 inches  Weight: 246 pounds    BP #1: 144 / 82mm  Hg   BMI: 42.38  BSA: 2.14  Wt chg: 10    Allergies  ! COMPAZINE  New Medication:  BACLOFEN 10 MG TABS (BACLOFEN) 1 by mouth qhs  * SOLU MEDROL 1 GRAM IV therapy for 3 days  * AQUA THERAPY 3 times a week  Dx:  Multiple Sclerosis    Intake recorded by: Seymour Bars MA  December 04, 2009 10:15 AM  Smoking Status: non-smoker      Diagnostic Testing:   MRI History 2004 per Dr Milas Kocher notation - noticeable black holes with moderate to severe lesion load.  Additional MRI History 6/09 MRI brain - mild increase in WML  no CEL  6/09 MRI C-spine - no WMD noted        Physical Exam-Neurology   Appearance: well-developed, well-nourished and in no acute distress. coughing  Language/Speech: no aphasia and no dysarthria  Atten/concentration: awake and alert  Orientation: oriented x3  Memory: grossly intact  Fund of Knowledge: grossly intact  Ophthalmoscopic: no papilledema/good venous pulsations  Pupils: equal, round, reactive to light and accomodation  Visual Fields:   normal  CN 3,4,6 (EOM): extraocular movements intact, normal gaze alignment, and no nystagmus  Cranial Nerve 5 (Trigeminal): bilateral facial sensation normal  Cranial Nerve 7 (Facial): normal facial symmetry and strength  CN 8 (Auditory): proper hearing to finger rub bilaterally  Cranial Nerve 9 (Glossophar): soft palate elevates in the midline spontaneously  CN 11 (spinal access): full shoulder shrug equally  Cranial Nerve 12 (Hypoglossal): tongue is midline on protrusion    General:   Pulmonary: clear to auscultation  CVS: RRR, nl S1 S2, no S3 S4, no M    Muscle Strength     Right Upper Extremity   Shoulder abductor (right): 5/5  Elbow flexor (right): 5/5  Elbow extensor (right): 5/5  Wrist Flexors (right): 5/5  Wrist Extensors (right): 5/5  Finger Flexors (right): 5/5  Interosseous (right): 5/5  APB (right): 5/5    Left Upper Extremity   Shoulder abductor (left): 5/5  Elbow flexor (left): 5/5  Elbow extensor (left): 5/5  Wrist Flexors (left): 5/5  Wrist  Extensors (left): 5/5  Finger Flexors (left): 5/5  Interosseous (left): 5/5  APB (left): 5/5    Right Lower Extremity   Hip Flexion (right): 5/5  Hip Adduction (right): 5/5  Hip Abduction (right): 5/5  Knee extension (right): 5/5  Knee Flexion (right): 5/5  Ankle Dorsi Flexor (right): 5/5  Pedal Flexor (right): 5/5  Ankle Inverter (right): 5/5  Ankle Everter (right): 5/5  Further Strength Testing unable to exam left leg (pain)    Muscle Tone Examination     Right Upper Extremity   Right Upper Extremity: normal    Left Upper Extremity   Left Upper Extremity: normal    Right Lower Extremity   Right Lower Extremity: normal    Left Lower Extremity   Left Lower Extremity: normal    Reflexes     Right   Biceps (right): decreased  Triceps (right): decreased  Brachio Radialis (right): decreased  Patellar (right): decreased  Ankle Jerk (right): decreased    Left   Biceps (left): decreased  Triceps (left): decreased  Brachio Radialis (left): decreased  Patellar (left): decreased  Ankle Jerk (left): decreased      Coordination/ Station & Gait:   Rapid alternating movements (  right): normal  Finger to nose (right):normal  Heel to shin (right): normal  Finger Tapping (right): normal  Rapid alternating movements (left):slow  Finger to nose (left):normal  Heel to shin (left): normal  Finger Tapping (left): normal  Romberg: positive  Station/Gait: antalgic        Medications   New Prescriptions/Refills:  ATIVAN 1 MG  TABS (LORAZEPAM) take 1 by mouth 1 hour before scan 1 by mouth right before scan  #2 x 0, 12/04/2009, Silva Bandy D King MA  AQUA THERAPY 3 times a week  Dx:  Multiple Sclerosis  #0 x 0, 12/04/2009, [name] D King MA  SOLU MEDROL 1 GRAM IV therapy for 3 days  #0 x 0, 12/04/2009, [name] D King MA  BACLOFEN 10 MG TABS (BACLOFEN) 1 by mouth qhs  #30 x 5, 12/04/2009, Seymour Bars MA    New medications:  BACLOFEN 10 MG TABS -- 1 by mouth qhs  Start date: 12/04/2009  SOLU MEDROL 1 GRAM -- IV therapy for 3 days  Start date:  12/04/2009  AQUA THERAPY -- 3 times a week  Dx:  Multiple Sclerosis  Start date: 12/04/2009    Assessment    A 53 year old female with the diagnosis of multiple sclerosis, currently on treatment with Rebif. The patient is tolerating the medication well. She has done very well with no recent attacks until a few days ago when she developed increased blurriness in the eyes, especially the right, as well as painful muscle spasms in the left leg. My recommendation at this time would be to check a urinalysis and start IV Solu-Medrol 1 gram a day for three days. The patient is also going to start baclofen 10 mg at bedtime. She will be referred to [facility] for physical therapy as well as aquatic therapy and we might proceed with massage therapy as well.     x        Plan    She will come back to the clinic to follow up with Letitia Libra, CNP.     x      Today's Orders   TOBACCO USE ASSESSED [CPT-1000F]  CURRENT SMOKELESS TOBACCO USER [CPT-1035F]  PT ENCOUNTER WAS DOCUMENTED USING [facility] CERTIFIED EMR [CPT-G8447]  LIST CURRENT MEDICATIONS WITH DOSAGES AND VERIFICATION DOCUMENTED [CPT-G8427]  Physical Therapy Evaluation [UCP-11111]  Other, Referral  [UCP-11111]  99215 - Ofc Vst, Est Level V [CPT-99215]  MRI, Head w/ & w/o contrast [CPT-70553]  MRI, Spine, Cervical w/o contrast [CPT-72141]  Urine Culture & Sensitivity [CPT-87086]  Urinalysis, automated (UA) (7909)  [CPT-81003]    Medical Decision Making:   * review/order clinical lab tests  * reviewed old lab tests  * review/order radiology tests  * Permanent chart problem/surgery list reviewed  * Permanent chart chronic med/ allergy list reviewed  * Permanent chart social/family history reviewed  * reviewed films    Patient Education:   Patient advised on healthy diet and supplements .    Risks & Benefits:   * Risks, benefits and treatment options discussed with patient.  Risk of complications: moderate    Time spent with patient:   Established:    40 min  More than 50% of  this time was spent discussing:   * Management  * Treatment Plan  * Diagnostic Results as indicated above      Prescriptions:  ATIVAN 1 MG  TABS (LORAZEPAM) take 1 by mouth 1 hour before scan 1 by mouth right before scan  #2 x 0  Entered by: Seymour Bars MA   Authorized by: Nestor Lewandowsky MD   Signed by: Seymour Bars MA on 12/04/2009   Method used: Print then Give to Patient   RxID: [id number]  AQUA THERAPY 3 times a week  Dx:  Multiple Sclerosis  #0 x 0   Entered by: Seymour Bars MA   Authorized by: Nestor Lewandowsky MD   Signed by: Seymour Bars MA on 12/04/2009   Method used: Print then Give to Patient   RxID: [id number]  SOLU MEDROL 1 GRAM IV therapy for 3 days  #0 x 0   Entered by: Seymour Bars MA   Authorized by: Nestor Lewandowsky MD   Signed by: Seymour Bars MA on 12/04/2009   Method used: Printed then faxed to ...     [facility]     [address]     [place], [place]  [address]     Ph: [phone]     Fax: [phone]   RxID: [id number]  BACLOFEN 10 MG TABS (BACLOFEN) 1 by mouth qhs  #30 x 5   Entered by: Seymour Bars MA   Authorized by: Nestor Lewandowsky MD   Signed by: Seymour Bars MA on 12/04/2009   Method used: Print then Give to Patient   RxID: [id number]                  Process Orders  Check Orders Results:      EMR Link Lab: ABN not required for this insurance  Not all tests in this order have been sent for requisitioning  Pending tests not yet sent for requisitioning:      12/04/2009: EMR Link Lab -- Urine Culture & Sensitivity [CPT-87086] (signed)      12/04/2009: EMR Link Lab -- Urinalysis, automated (UA) (7909)  [CPT-81003] (signed)

## 2009-12-04 NOTE — Unmapped (Signed)
Signed by Buel Ream CNP on 12/08/2009 at 10:04:55  Patient: Melanie [name]  Note: All result statuses are Final unless otherwise noted.    Tests: (1) Urine Culture, Comprehensive (517616)    Order Note: Performing Site: [facility] [address]   [place] [address].  ! Urine Culture, Comprehensive                              *                          (Final)                          1,000-10,000 cfu/mL Proteus species                          No further workup                          Mixed skin/urogenital flora. No further workup.    Note: An exclamation mark (!) indicates a result that was not dispersed into   the flowsheet.  Document Creation Date: 12/06/2009 6:08 PM  _______________________________________________________________________    (1) Order result status: Final  Collection or observation date-time: 12/04/2009 10:23  Requested date-time: 12/04/2009 10:23  Receipt date-time: 12/04/2009 10:23  Reported date-time: 12/06/2009 17:56:00  Referring Physician:    Ordering Physician:  Hulan Fess ([email or web address])  Specimen Source:   Source: Leverne Humbles Order Number: [id number])  Lab site:

## 2009-12-05 ENCOUNTER — Inpatient Hospital Stay

## 2009-12-11 NOTE — Unmapped (Signed)
Signed by Lyla Son on 12/11/2009 at 15:48:16                        [facility]         Neurology         [address]         [place], [place] [address]         p [phone] f [phone]         [email or web address]  December 04, 2009      [name], M.D.    RE: JAMILAH JEAN  DOB:   1956-04-25    Dear Dr. Vanita Panda:    I had the pleasure of seeing Carly Sabo in multiple sclerosis clinic on 12/04/2009. The patient is a 53 year old African-American female with the diagnosis of relapsing-remitting multiple sclerosis. She was previously followed by Dr. Cherylann Banas who diagnosed her with RRMS.     Reportedly she has had symptoms since about the year 2000 when she was noticing forgetfulness, forgetting directions and getting lost easily. At that time she also started to notice worsening fatigue. She also was diagnosed with obstructive sleep apnea and is now on CPAP. Subsequently she had episodes of dizziness which she described as a spinning sensation. She also had previous episodes of optic neuritis. She was started on Rebif which she has continued until now.     According to the patient, she was doing pretty well until recently when she noticed that her vision is not as good as it used to be. She has noticed problems especially with the right eye. She has also developed painful muscle spasms and severe pain in the left leg which is causing her to limp. She continues to complain of short-term memory problems and fatigue as well as the muscle spasms. Her bladder functions pretty well; however, she says she has double voiding. She  has no significant bowel problems.     On testing of visual acuity, her right eye was 20/200 and left 20/40.     Impression:    A 53 year old female with the diagnosis of multiple sclerosis, currently on treatment with Rebif. The patient is tolerating the medication well. She has done very well with no recent attacks until a few days ago when  she developed increased blurriness in the eyes, especially the right, as well as painful muscle spasms in the left leg. My recommendation at this time would be to check a urinalysis and start IV Solu-Medrol 1 gram a day for three days. The patient is also going to start baclofen 10 mg at bedtime. She will be referred to [facility] for physical therapy as well as aquatic therapy and we might proceed with massage therapy as well.      Plan:  She will come back to the clinic to follow up with Letitia Libra, CNP.      Thank you for allowing me to participate in the care of this pleasant patient.  If you would like a copy of my office note, please contact our Medical Records department at [phone].  Please feel free to contact me should you have any concerns or questions.    Sincerely,      Hulan Fess, MD

## 2009-12-15 NOTE — Unmapped (Signed)
Signed by Seymour Bars MA on 12/15/2009 at 13:48:34    Clinical Lists Changes    Orders:  Added new Referral order of Stephannie Peters ([id number]) - Signed  Added new Referral order of Other DME [phone]) - Signed

## 2009-12-22 NOTE — Unmapped (Signed)
Signed by Pearline Cables MA on 12/22/2009 at 13:52:41    PHONE NOTE  Call back at Home Phone: [phone]  Caller's Cell Phone #: [phone]  Caller: [facility]  Call for: Medical asst    Reason for Call: referral. Needs pt referal to say also aquatics.  Can be faxed to [phone].      Initial call taken by: Pearline Cables MA,  December 22, 2009 1:49 PM      New Orders:  Physical Therapy Evaluation [UCP-11111]

## 2009-12-23 NOTE — Unmapped (Signed)
Signed by Buel Ream CNP on 12/23/2009 at 00:00:00  Physician Order      Imported By: Betsey Amen 12/30/2009 09:13:43    _____________________________________________________________________    External Attachment:    Please see [facility] EMR for this document.

## 2009-12-23 NOTE — Unmapped (Signed)
Signed by Vladimir Creeks MA on 12/24/2009 at 12:28:27    PHONE NOTE  Call back at Home Phone: [phone]  Caller's Cell Phone #: [phone]  Caller: patient  Department: Neurology  Call for: Heloise Gordan    Reason for Call: wants to know who Chrissie - the MA saw for a massage therapist - pt talked to her about it      Initial call taken by: Juliette Alcide Rippy,  December 23, 2009 1:04 PM      FOLLOW UP  I spoke with Cadie and told her that Silva Bandy is out of the office and she will have to wait until Thursday when [name] get back.  Follow-up by:  Imelda Pillow MA,  December 23, 2009 3:08 PM    FOLLOW UP  Chrissie,   Do you remember talking to this pt about massage?  Follow-up by:  Blanch Media CMA,  December 24, 2009 10:29 AM    FOLLOW UP  Juliette Alcide spoke to this patient yesterday and informed her my friend works at [employer]. Patient would have to check with her insurance to see where she can go.   Follow-up by:  Vladimir Creeks MA,  December 24, 2009 12:27 PM

## 2009-12-23 NOTE — Unmapped (Signed)
Signed by Imelda Pillow MA on 12/23/2009 at 15:05:56    PHONE NOTE  Call back at Home Phone: [phone]  Caller's Cell Phone #: [phone]  Caller: patient  Department: Neurology  Call for: Layah Skousen    Reason for Call: pt lost the order for the walker, needs MA to fax order  to [facility] fax # [phone], ph# [phone]. call pt when the order is faxed.       Initial call taken by: Juliette Alcide Rippy,  December 23, 2009 2:45 PM      FOLLOW UP  patient advised, information sent  Follow-up by:  Imelda Pillow MA,  December 23, 2009 3:05 PM

## 2009-12-23 NOTE — Unmapped (Signed)
Signed by Blanch Media CMA on 12/24/2009 at 10:28:40    PHONE NOTE  Call back at Home Phone: [phone]  Caller: patient  Department: Neurology  Call for: [facility]    Reason for Call: [facility] [phone] fax'd some add'l paperwork for rx for walker  - pt talking to [facility]      Initial call taken by: Young Berry,  December 23, 2009 4:12 PM      FOLLOW UP  paperwork received. Silva Bandy will have Gwen or Samanthan Dugo sign once back they are back in the office.  Follow-up by:  Blanch Media CMA,  December 24, 2009 10:28 AM

## 2009-12-24 NOTE — Unmapped (Signed)
Signed by   [employer] on 12/24/2009 at 13:42:45  Patient: Melanie [name]  Note: All result statuses are Final unless otherwise noted.    Tests: (1) MRI CERV SP W/O CON 25+SL (30090)    Order Note:     Order Note:     Order Note: Confidential Patient Information: Accompanied are [facility]   results that are being delivered by [facility].   If you receive a clinical result for a patient that is not yours please fax to   [facility] at [phone].    Order Note:      MRI OF THE BRAIN WITHOUT AND WITH CONTRAST     Pre- and postcontrast axial and coronal images were obtained.   Comparison is made with previous examination dated 06/05/2008.      The base of the skull appears normal.  Lesions of high signal  intensity are seen on T2 and FLAIR images in the periventricular  white matter.  Several of the lesions have a perpendicular  orientation to the margins of the ventricles, characteristic of  multiple sclerosis plaques.  The plaques are unchanged in size and  distributions since the previous examination.  There is also  demyelination within the pons which is unchanged.  None of the  lesions is enhancing on postcontrast images.      IMPRESSION-  Multiple plaques of multiple sclerosis are seen in the  periventricular white matter of both hemispheres.  Highly signal  demyelination is also seen in the pons.  No interval change is  seen since the previous examination of 06/05/2008.      MRI OF THE CERVICAL SPINE WITHOUT CONTRAST     Routine sagittal and axial images were obtained.  Disc bulging is  noted at C4-C5 and C5-C6 levels slightly indenting the thecal sac.  No disc herniation or nerve root compression is noted.  The  cervical cord appears normal.  No demyelinating plaques are seen.      IMPRESSION-  Mild central disc bulging at C4-C5 and C5-C6 levels.  No plaques  of demyelination are seen in the cervical cord.               Read ByCatheryn Bacon MD       Released By- [name]       Released  Date Time- 12/24/09 1329          Transcriptionist- [name]     ------------------------------------------------------------------------------      ! MRI CERV SP W/O CON 25+SL                              Result Below...        RESULT: See Report for Impression  (R)    Note: An exclamation mark (!) indicates a result that was not dispersed into   the flowsheet.  Document Creation Date: 12/24/2009 1:42 PM  _______________________________________________________________________    (1) Order result status: Final  Collection or observation date-time: 12/24/2009 10:45  Requested date-time:   Receipt date-time:   Reported date-time: 12/24/2009 13:29  Referring Physician: Diamantina Monks NO PCP  Ordering Physician: Hulan Fess [facility])  Specimen Source:   Source: [name] Order Number: [id number] RADIOLOGY  Lab site:

## 2009-12-24 NOTE — Unmapped (Signed)
Signed by Blanch Media CMA on 12/24/2009 at 15:09:03    PHONE NOTE    PHONE NOTE  Call From: [facility] hospital MRI dept  Caller Phone #: [phone]  Call For: Melanie [name]  Caller's Name: Eunice Blase  pt will get the MRI of head and cervical spine done there    Initial call taken by: [facility] Rippy,  December 24, 2009 10:51 AM      FOLLOW UP  noted    Follow-up by:  Blanch Media CMA,  December 24, 2009 3:09 PM

## 2009-12-24 NOTE — Unmapped (Signed)
Signed by   [employer] on 12/24/2009 at 13:42:45  Patient: Melanie [name]  Note: All result statuses are Final unless otherwise noted.    Tests: (1) MRI BRAIN W+W/O (25+SLICES) (30070)    Order Note:     Order Note:     Order Note: Confidential Patient Information: Accompanied are [facility]   results that are being delivered by [facility].   If you receive a clinical result for a patient that is not yours please fax to   [facility] at [phone].    Order Note:      MRI OF THE BRAIN WITHOUT AND WITH CONTRAST     Pre- and postcontrast axial and coronal images were obtained.   Comparison is made with previous examination dated 06/05/2008.      The base of the skull appears normal.  Lesions of high signal  intensity are seen on T2 and FLAIR images in the periventricular  white matter.  Several of the lesions have a perpendicular  orientation to the margins of the ventricles, characteristic of  multiple sclerosis plaques.  The plaques are unchanged in size and  distributions since the previous examination.  There is also  demyelination within the pons which is unchanged.  None of the  lesions is enhancing on postcontrast images.      IMPRESSION-  Multiple plaques of multiple sclerosis are seen in the  periventricular white matter of both hemispheres.  Highly signal  demyelination is also seen in the pons.  No interval change is  seen since the previous examination of 06/05/2008.      MRI OF THE CERVICAL SPINE WITHOUT CONTRAST     Routine sagittal and axial images were obtained.  Disc bulging is  noted at C4-C5 and C5-C6 levels slightly indenting the thecal sac.  No disc herniation or nerve root compression is noted.  The  cervical cord appears normal.  No demyelinating plaques are seen.      IMPRESSION-  Mild central disc bulging at C4-C5 and C5-C6 levels.  No plaques  of demyelination are seen in the cervical cord.               Read ByCatheryn Bacon MD       Released By- [name]   Released Date Time- 12/24/09 1329          Transcriptionist- [name]     ------------------------------------------------------------------------------      ! MRI BRAIN W+W/O (25+SLICES)                              Result Below...        RESULT: See Report for Impression  (R)    Note: An exclamation mark (!) indicates a result that was not dispersed into   the flowsheet.  Document Creation Date: 12/24/2009 1:42 PM  _______________________________________________________________________    (1) Order result status: Final  Collection or observation date-time: 12/24/2009 10:29  Requested date-time:   Receipt date-time:   Reported date-time: 12/24/2009 13:29  Referring Physician: Diamantina Monks NO PCP  Ordering Physician: Hulan Fess [facility])  Specimen Source:   Source: [name] Order Number: [id number] RADIOLOGY  Lab site:

## 2009-12-28 NOTE — Unmapped (Signed)
Signed by Nestor Lewandowsky MD on 12/28/2009 at 00:00:00  FMLA      Imported By: Betsey Amen 12/28/2009 22:22:10    _____________________________________________________________________    External Attachment:    Please see [facility] EMR for this document.

## 2009-12-29 ENCOUNTER — Inpatient Hospital Stay

## 2010-01-01 NOTE — Unmapped (Signed)
Signed by Seymour Bars MA on 01/01/2010 at 17:00:06    PHONE NOTE  Call back at Home Phone: [phone]  Caller's Cell Phone #: [phone]  Caller: patient  Department: Neurology  Call for: [facility]    Reason for Call: refill rebif [facility] [phone]      Initial call taken by: Young Berry,  January 01, 2010 4:07 PM      FOLLOW UP  Patient advised  Follow-up by:  Seymour Bars MA,  January 01, 2010 5:00 PM    Prescriptions:  REBIF 44 MCG/0.5ML SOLN (INTERFERON BETA-1A) 1 injection TIW  #36 x 3   Entered by: Seymour Bars MA   Authorized by: Nestor Lewandowsky MD   Signed by: Seymour Bars MA on 01/01/2010   Method used: Historical   RxID: [id number]

## 2010-01-19 NOTE — Unmapped (Signed)
Signed by Seymour Bars MA on 01/19/2010 at 15:38:19    PHONE NOTE  Call back at Home Phone: [phone]  Caller: patient  Department: Neurology  Call for: [facility]    Reason for Call: refill: provigil,  uses [facility] [phone], pt wants this called in today      Initial call taken by: Juliette Alcide Rippy,  January 19, 2010 3:12 PM      New Medications:  PROVIGIL 200 MG TABS (MODAFINIL) 1 by mouth bid    FOLLOW UP  Called to pharmacy-patient advised  Follow-up by:  Seymour Bars MA,  January 19, 2010 3:38 PM

## 2010-01-20 NOTE — Unmapped (Signed)
Signed by Buel Ream CNP on 01/20/2010 at 00:00:00  PT Reports      Imported By: Betsey Amen 01/30/2010 09:20:27    _____________________________________________________________________    External Attachment:    Please see [facility] EMR for this document.

## 2010-01-21 NOTE — Unmapped (Signed)
Signed by Seymour Bars MA on 01/21/2010 at 15:01:49    PHONE NOTE  Call back at Home Phone: [phone]  Caller's Cell Phone #: [phone]  Caller: patient  Department: Neurology  Call for: [facility] Baria    Reason for Call: pt needs a return to work note with either 1/26 or 1/27, pt will come to office TODAY to pick up letter       Initial call taken by: Juliette Alcide Rippy,  January 21, 2010 2:45 PM      FOLLOW UP  Noted  Follow-up by:  Seymour Bars MA,  January 21, 2010 3:01 PM

## 2010-01-21 NOTE — Unmapped (Signed)
Signed by Seymour Bars MA on 01/21/2010 at 16:04:15                        [facility]         Neurology         [address], [address]         [place], [place] [address]         p [phone] f [phone]         [email or web address]    January 21, 2010        RE: Melanie [name]  DOB:   [birth year]        To whom it may concern:      Please excuse the above named patient from work on Patient may return to work as of 01/21/2010     Sincerely,    Letitia Libra, CNP

## 2010-01-26 NOTE — Unmapped (Signed)
Signed by Seymour Bars MA on 01/27/2010 at 15:01:08    PHONE NOTE  Call back at Home Phone: [phone]  Caller: patient  Department: Neurology  Call for: [facility]    Reason for Call: pt needs more info on ltr for work      Initial call taken by: Young Berry,  January 26, 2010 4:32 PM      FOLLOW UP  pt called again, she needs the return to work note with the following info: needs actual date of return to work - Jan 30th 2011 and that Dr Grayland Jack has released pt to go back to work and if there are any restrictions faxed to work  [phone]. call pt at [phone]  Follow-up by:  Juliette Alcide Rippy,  January 27, 2010 2:32 PM    FOLLOW UP  Letter faxed  Follow-up by:  Seymour Bars MA,  January 27, 2010 3:01 PM

## 2010-01-26 NOTE — Unmapped (Signed)
Signed by Seymour Bars MA on 01/26/2010 at 16:13:16    PHONE NOTE  Call back at Home Phone: [phone]  Caller's Cell Phone #: [phone]  Caller: patient  Department: Neurology  Call for: Sharmeka Palmisano    Reason for Call: pt needs more info on the work excuse note and pt will call back with fax #      Initial call taken by: Juliette Alcide Rippy,  January 26, 2010 3:58 PM      FOLLOW UP  Noted  Follow-up by:  Seymour Bars MA,  January 26, 2010 4:13 PM

## 2010-01-27 NOTE — Unmapped (Signed)
Signed by Seymour Bars MA on 01/27/2010 at 15:02:38                        [facility]         Neurology         [address]         [place], [place] [address]         p [phone] f [phone]         [email or web address]    January 27, 2010        RE: Melanie [name]  DOB:   July 25, 1956        To whom it may concern:      Please excuse the above named patient from work on 12/04/2009 through 01/25/2010 due to Multiple Sclerosis flare up.  Patient may return to work with no restrictions..      Sincerely,      Hulan Fess, MD

## 2010-01-28 NOTE — Unmapped (Signed)
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    Started in Nov having muscle pain/spasms left leg and lower right hip pain, blurred vision.  Took IV steroids three days with benefit.   Took time off work to start PT - Had fall on right side slipping on ice.  After fall , admits to LBP, and knee pain bilaterally.  Did not go to ER - no xray's   Here for MRI review. Stable MRI WMD of brain without enhancement.  No demyelination of C-Spine.  No evidence of changes from 2008.       PAST HISTORY  Past Medical History (reviewed - no changes required):  Asthma, Multiple Sclerosis, Dx 2000, Hypertension  Social History (reviewed - no changes required): Language: English,   Marital Status: divorced,   Employment Status: employed full-time,   Occupation: Teacher, music  Patient Lives at: apartment,   Support System: adequate  Caffeine per Day: 2  Alcohol Use: occasionally  Drug Use: none  Tobacco Usage:non-smoker  Passive Smoke Exposure: yes    Review of Systems   General: Denies fevers, chills, fatigue, weight gain.   Eyes: Complains of blurring, vision loss. Denies diplopia.   Ears/Nose/Throat: Denies any specific issues at this time.   Cardiovascular: Denies any specific issues at this time.   Respiratory: Denies any specific issues at this time.   Gastrointestinal: Denies any specific issues at this time.   Genitourinary: Complains of incomplete empty. Complains of urinary frequency, incomplete empty.   Musculoskeletal: Complains of joint pain, muscle cramps, muscle pains, stiffness.   Skin: Denies any specific issues at this time.   Neurologic: Complains of paresthesias, poor coordination, word finding problems, concentration problems, memory problems, gait difficulties. Denies falls.      Psychiatric: Denies any specific issues at this time.   Endocrine: Denies any specific issues at this time.   Heme/Lymphatic: Denies any specific issues at this time.   Allergic/Immunologic: Denies any specific issues at this time.       Intake-Neurology   Chief Complaint: f/u MS  Handedness: right    Vital Signs   Height: 64 inches    BP #1: 142 / 88mm Hg   Allergies  ! COMPAZINE  New Medication:  BACLOFEN 10 MG TABS (BACLOFEN) 1 by mouth by mouth tid    Intake recorded by: Seymour Bars MA  January 28, 2010 3:59 PM      Screening for unhealthy alcohol use performed.  Women (Any Age) or Men (over Age 68): nondrinker      Diagnostic Testing:   MRI History 2004 per Dr Milas Kocher notation - noticeable black holes with moderate to severe lesion load.  Additional MRI History 6/09 MRI brain - mild increase in WML  no CEL  6/09 MRI C-spine - no WMD noted          Assessment and Plan  New Problems:  Dx of SPASM OF MUSCLE (ICD-728.85)  Onset: 01/28/2010    Medications   New Prescriptions/Refills:  BACLOFEN 10 MG TABS (BACLOFEN) 1 by mouth by mouth tid  #90 x 3, 01/28/2010, Buel Ream CNP    New medications:  BACLOFEN 10 MG TABS -- 1 by  mouth by mouth tid  Start date: 01/28/2010    Assessment    54 yo AAF with RRMS on Rebif with stable MRI   Muscle Spasms - improved with baclofen  Knee pain - suggested pt eval with PCP - may try Alleve twice a day for 2 weeks     Plan    1. Continue Rebif with lab protocol every 6 mos  2. Increase Baclofen from 10 mg  at bedtime to i by mouth three times a day  3. Work excuse for possible fatigue during her night shift  4. F/U in 2 mos to adjust work excuse   Today's Orders   99214 - Ofc Vst, Est Level IV [id number]    Medical Decision Making:   * reviewed old lab tests  * independent review of data- e.g. image- specimen  * Permanent chart problem/surgery list reviewed  * Permanent chart chronic med/ allergy list reviewed    Is visit primarily counseling/coordination? yes  Risk of  complications: moderate    Time spent with patient:   Established:    25 min  More than 50% of this time was spent discussing:   * Treatment Plan  * Diagnostic Results as indicated above      Prescriptions:  BACLOFEN 10 MG TABS (BACLOFEN) 1 by mouth by mouth tid  #90 x 3   Entered and Authorized by: Buel Ream CNP   Signed by: Buel Ream CNP on 01/28/2010   Method used: Print then Give to Patient   RxID: [id number]                  ]

## 2010-01-28 NOTE — Unmapped (Signed)
Signed by Buel Ream CNP on 01/28/2010 at [facility]                        [facility]         Neurology         [address]         [place], [place] [address]         p [phone] f [phone]         [email or web address]    January 28, 2010        RE: Melanie [name]  DOB:   02/23/1956        To whom it may concern:      Melanie [name] was seen in Neurology clinic today.  She is requiring 10 minute rest periods every 2 hours during her shift at work until her next office visit in 2 months.        If you have any questions, please contact [facility] at [phone]      Sincerely,      Buel Ream, CNP

## 2010-02-01 NOTE — Unmapped (Signed)
Signed by Lyla Son on 02/01/2010 at [facility]                        [facility]         Neurology         [address], [address]         [place], [place] [address]         p [phone] f [phone]         [email or web address]    January 28, 2010      [name], M.D.    RE: Melanie [name]  DOB:   [birth year]    Dear Dr. Vanita Panda:    I had the pleasure of seeing your patient, Melanie [name], in consultation in the [facility] on 01/28/2010.     History of Present Illness:  54 year old AAF with RRMS - on Rebif - does not miss doses.      Started in November having muscle pain/spasms in left leg and lower right hip pain, blurred vision.  Took IV steroids three days with benefit. Took time off work to start PT. Had fall on right side with slipping on ice.  After fall, admits to LBP and knee pain bilaterally. Did not go to ER - no x-rays done.     Here for MRI review. Stable MRI white matter disease of brain without enhancement.  No demyelination in C-spine.  No evidence of changes from 2008.      Impression:    54 year old AAF with RRMS on Rebif with stable MRI.  Muscle Spasms - improved with baclofen.  Knee pain - suggested pt evaluate with PCP - may try Aleve twice a day for 2 weeks.      Plan:  1. Continue Rebif with lab protocol every 6 months.  2. Increase baclofen from 10 mg at bedtime to one by mouth three times a day.  3. Work excuse for possible fatigue during her night shift.  4. F/U in 2 months to adjust work excuse.    Thank you for allowing me to participate in the care of this pleasant patient.  If you would like a copy of my office note, please contact our Medical Records department at [phone].  Please feel free to contact me should you have any concerns or questions.    Sincerely,    Letitia Libra, CNP

## 2010-02-10 NOTE — Unmapped (Signed)
Signed by Buel Ream CNP on 02/10/2010 at 00:00:00  PT Reports      Imported By: Betsey Amen 03/01/2010 22:12:48    _____________________________________________________________________    External Attachment:    Please see [facility] EMR for this document.

## 2010-02-20 NOTE — Unmapped (Signed)
Signed by Seymour Bars MA on 02/23/2010 at 07:43:49    PHONE NOTE  Call back at Home Phone: [phone]  Caller's Cell Phone #: [phone]  Caller: patient  Department: Neurology  Call for: [facility]    Reason for Call: copy of rx for rebif fax'd to [phone] for copay assistance      Initial call taken by: Young Berry,  February 20, 2010 4:22 PM      Prescriptions:  REBIF 44 MCG/0.5ML SOLN (INTERFERON BETA-1A) 1 injection TIW  #36 x 3   Entered by: Seymour Bars MA   Authorized by: Nestor Lewandowsky MD   Signed by: Seymour Bars MA on 02/23/2010   Method used: Printed then faxed to ...     [facility] (retail)     [address], [place]  [address]     Ph: [phone]     Fax: [phone]   RxID: [id number]

## 2010-02-24 NOTE — Unmapped (Signed)
Signed by Nestor Lewandowsky MD on 02/24/2010 at 00:00:00  [facility]      Imported By: Betsey Amen 03/01/2010 22:08:32    _____________________________________________________________________    External Attachment:    Please see [facility] EMR for this document.

## 2010-03-19 NOTE — Unmapped (Signed)
Signed by Seymour Bars MA on 03/19/2010 at 10:09:09    PHONE NOTE  Call back at Home Phone: [phone]  Caller: patient  Department: Neurology  Call for: [facility]    Reason for Call: pt expecting MA to fax rx for rebif to [facility] (can't provide fax#)  and expecting a call from [facility] but pt has not heard from them      Initial call taken by: Young Berry,  March 19, 2010 10:03 AM      FOLLOW UP  Number given to contace [facility]-See confirmation from 02/24/10  Follow-up by:  Seymour Bars MA,  March 19, 2010 10:09 AM

## 2010-03-24 NOTE — Unmapped (Signed)
Signed by Seymour Bars MA on 03/24/2010 at 15:22:03    PHONE NOTE  Call back at Home Phone: [phone]  Caller: patient  Department: Neurology  Call for: [facility]    Reason for Call: needs MA to call about a drug test for work      Initial call taken by: Young Berry,  March 24, 2010 3:03 PM      FOLLOW UP  Going to apply for a part time job ok to do  Follow-up by:  Seymour Bars MA,  March 24, 2010 3:22 PM

## 2010-04-29 NOTE — Unmapped (Signed)
Signed by Nestor Lewandowsky MD on 04/29/2010 at 00:00:00  FML[employer]      Imported By: Betsey Amen 05/10/2010 15:43:21    _____________________________________________________________________    External Attachment:    Please see [facility] EMR for this document.

## 2010-04-30 NOTE — Unmapped (Signed)
Signed by Nestor Lewandowsky MD on 04/30/2010 at 00:00:00  FMLA      Imported By: Coletta Memos 05/13/2010 10:29:01    _____________________________________________________________________    External Attachment:    Please see [facility] EMR for this document.

## 2010-05-01 NOTE — Unmapped (Signed)
Signed by Tiburcio Bash MA on 05/04/2010 at 17:12:46    PHONE NOTE  Call back at Home Phone: [phone]  Caller's Cell Phone #: [phone]   Caller: patient  Call for: AD     Reason for Call: pt refused to disclose why but Patient wants a call back from you today.       Initial call taken by: Sherri Sear,  May 01, 2010 3:50 PM    Current Allergies:   ! COMPAZINE    New Medications:  FLAGYL 500 MG TABS (METRONIDAZOLE) 1 by mouth bid    FOLLOW UP  left message to call back  Follow-up by:  Sharyne Peach RMA,  May 01, 2010 4:28 PM    FOLLOW UP  left another message to call us  Follow-up by:  Sharyne Peach RMA,  May 04, 2010 10:18 AM    FOLLOW UP  vaginal odor no discharge,or burning she states she always has this odorbut wants to tryflagyl  Follow-up by:  Tiburcio Bash MA,  May 04, 2010 1:25 PM    FOLLOW UP  please try cell phone # - [phone]  Follow-up by:  Donald Pore,  May 04, 2010 4:09 PM    FOLLOW UP  pt notified meds were called in  Follow-up by:  Tiburcio Bash MA,  May 04, 2010 5:12 PM    Prescriptions:  FLAGYL 500 MG TABS (METRONIDAZOLE) 1 by mouth bid  #14 x 0   Entered and Authorized by: Simeon Craft MD   Signed by: Simeon Craft MD on 05/04/2010   Method used: Reprint   RxID: [id number]

## 2010-05-07 NOTE — Unmapped (Signed)
Signed by Jamelle Rushing MD on 05/07/2010 at 16:36:23      Reason for Visit   Chief Complaint: coughing, asthma    History from: patient    Allergies  ! COMPAZINE  Allergy and adverse reaction list reviewed during this update.      Medications   REBIF 44 MCG/0.5ML SOLN (INTERFERON BETA-1A) 1 injection TIW  LISINOPRIL-HYDROCHLOROTHIAZIDE 20-25 MG TABS (LISINOPRIL-HYDROCHLOROTHIAZIDE) 1 by mouth daily  IBUPROFEN 800 MG  TABS (IBUPROFEN) 1 tab by mouth every 8 hours as needed Headache  MEDROL (PAK) 4 MG  TABS (METHYLPREDNISOLONE) take as directed  PROAIR HFA 108 (90 BASE) MCG/ACT  AERS (ALBUTEROL SULFATE) 2 puffs every 4-6 hours as needed  AEROCHAMBER PLUS   MISC (SPACER/AERO-HOLDING CHAMBERS) use with MDi  PROMETHAZINE-CODEINE 6.25-10 MG/5ML  SYRP (PROMETHAZINE-CODEINE) 1 tsp by mouth every 4-6 hours as needed coughing  AUGMENTIN 875-125 MG  TABS (AMOXICILLIN-POT CLAVULANATE) 1 tab by mouth bid  ATIVAN 1 MG  TABS (LORAZEPAM) take 1 by mouth 1 hour before scan 1 by mouth right before scan  FLOVENT HFA 110 MCG/ACT AERO (FLUTICASONE PROPIONATE  HFA) 2 puffs every 12 hours  PREDNISONE 20 MG TABS (PREDNISONE) take 3 tabs by mouth daily for 3 days, 2 tabs by mouth daily for 2 days 1 tab by mouth daily for 2 days then 1/2 tab by mouth daily  PROVIGIL 200 MG TABS (MODAFINIL) 1 by mouth bid  BACTRIM DS 800-160 MG TABS (SULFAMETHOXAZOLE-TRIMETHOPRIM) 1 tab by mouth bid  NUVIGIL 250 MG TABS (ARMODAFINIL) 1 by mouth qam  BACLOFEN 10 MG TABS (BACLOFEN) 1 by mouth by mouth tid  * SOLU MEDROL 1 GRAM IV therapy for 3 days  * AQUA THERAPY 3 times a week  Dx:  Multiple Sclerosis  FLAGYL 500 MG TABS (METRONIDAZOLE) 1 by mouth bid        Vital Signs:   Ht: 64 in.  Wt: 240 lbs.      BMI: 41.34  BSA: 2.12  Wt chg (lbs): -6  Temperature: 99.0  degrees F  oral    Patient appears to be in acute distress: no  BP: 132/84    Pulse Oximetry:   O2 Saturation: 95 % w/ room air   Intake recorded by: Josefa Half MA on May 07, 2010 4:01  PM    History of Present Illness   5 days of waxing/waning uri sxs, imrpoved some with mucinex. cough with yellow sputum, sob, wheezing. +n, no v. some loose stool. using alb about twice a day since sxs started. trouble for her to give a lot of details, states her MS affects her memory.           Physical Examination:   BP: 132/  84    Physical Exam- Detail:   General Appearance: Abnormal -obese  Eyes: Sclera white, conjunctiva without injection and pallor.  PERRLA.  EOMI  Ears: No lesions.  Tympanic membranes translucent, non-bulging.  Canal walls pink, without discharge.  Hearing grossly intact.  Nose/Face: Abnormal -mild infl  Oropharynx: Abnormal -mildly red  Respiratory: Abnormal -fair/good a/e, scattered wheezes and rhonchi  Neck: No thyromegaly.  No nodules, masses or tenderness.  Cardiac: S1 and S2 normal.  RRR without murmurs, rubs, gallops.  No JVD.           New Medications:  ZITHROMAX Z-PAK TABS (AZITHROMYCIN TABS)       Preventive Maintenance       Coordinating Care Providers   PCP Name: [name], MD  Prescriptions:  PROAIR HFA 108 (90 BASE) MCG/ACT  AERS (ALBUTEROL SULFATE) 2 puffs every 4-6 hours as needed  #1 x 1   Entered and Authorized by: Jamelle Rushing MD   Signed by: Jamelle Rushing MD on 05/07/2010   Method used: Print then Give to Patient   RxID: [id number]  EXBMWUXLK Z-PAK TABS (AZITHROMYCIN TABS)   #1 x 0   Entered and Authorized by: Jamelle Rushing MD   Signed by: Jamelle Rushing MD on 05/07/2010   Method used: Print then Give to Patient   RxID: [id number]      Assessment and Plan     Problems   Status of Existing Problems:  Assessed BRONCHITIS, ACUTE WITH BRONCHOSPASM as comment only - zpak, q4h alb, supportive care, f/u 1 week for recheck, call back sooner if worsens. gave neb x 85m. Jamelle Rushing MD - Signed    Medications   New Prescriptions/Refills:  PROAIR HFA 108 (90 BASE) MCG/ACT  AERS (ALBUTEROL SULFATE) 2 puffs every 4-6 hours as  needed  #1 x 1, 05/07/2010, Jamelle Rushing MD  ZITHROMAX Z-PAK TABS (AZITHROMYCIN TABS)  #1 x 0, 05/07/2010, Jamelle Rushing MD    Today's Orders   [phone] - Ofc Vst, Est Level III [[id number]]  Inhalation Treatment [CPT-94640]  Pulse oximetry [CPT-94760]    Disposition:   Return to clinic for Doctor Visit in 1 week(s)   Appointment Reason: f/u ashtma with AD                  Preventive Maintenance       ]

## 2010-05-14 NOTE — Unmapped (Signed)
Signed by Sharyne Peach RMA on 05/15/2010 at 10:52:59    PHONE NOTE  Call back at Home Phone: [phone]  Call back at Work Phone: [phone]  Caller's Cell Phone #: [phone]     PHONE NOTE  Pharmacy Name: [facility]  Caller: Vernona Rieger  Pharmacy Phone Number: [phone]  Call for: CL    Reason for Call: drug interaction, patient request???s substitution, Patient informed pharamcy that she is allergic to Compazine which is related to the PROMETHAZINE-CODEINE  SYRuP  also  the cough syrup has  Phenylephrine and it has the same composition as the Pseudoephedrine contained in other allergy medication.  They are both nasal decogestants. Need specific directions    Initial call taken by: Algie Coffer,  May 14, 2010 3:35 PM      FOLLOW UP  she can just take otc rob dm then  Follow-up by:  Jamelle Rushing MD,  May 14, 2010 3:36 PM    FOLLOW UP  Patient states that she has taken the medication before.  Wants to speak with doctor concerning this matter.  Please call  Follow-up by:  Algie Coffer,  May 14, 2010 4:15 PM    FOLLOW UP  Patients cell phone number is [phone] , home number is [phone] and work number is [phone].  She wants to speak with her regular doctor.  Please call.  Follow-up by:  Algie Coffer,  May 14, 2010 4:19 PM    FOLLOW UP  I have been trying to reach patient but her vm says her mailbox is full  Follow-up by:  Sharyne Peach RMA,  May 15, 2010 8:30 AM    FOLLOW UP  patient wants to know if she can take the phenergan w/codeine and Sudafed,  Dr Melvyn Neth prescribed yesterday. pharmacy wouldnt fill because they said she is allergic to Compazine, she said she has taken it in the past with no problem. please advise  Follow-up by:  Sharyne Peach RMA,  May 15, 2010 9:42 AM    FOLLOW UP  ok to take  Follow-up by:  Simeon Craft MD,  May 15, 2010 10:10 AM    FOLLOW UP  pharmacy and patient advised  Follow-up by:  Sharyne Peach RMA,  May 15, 2010 10:52 AM

## 2010-05-14 NOTE — Unmapped (Signed)
Signed by Jamelle Rushing MD on 05/14/2010 at 13:47:32      Reason for Visit   Chief Complaint: follow up    History from: patient    Allergies  ! COMPAZINE  Allergy and adverse reaction list reviewed during this update.      Medications   REBIF 44 MCG/0.5ML SOLN (INTERFERON BETA-1A) 1 injection TIW  LISINOPRIL-HYDROCHLOROTHIAZIDE 20-25 MG TABS (LISINOPRIL-HYDROCHLOROTHIAZIDE) 1 by mouth daily  IBUPROFEN 800 MG  TABS (IBUPROFEN) 1 tab by mouth every 8 hours as needed Headache  MEDROL (PAK) 4 MG  TABS (METHYLPREDNISOLONE) take as directed  PROAIR HFA 108 (90 BASE) MCG/ACT  AERS (ALBUTEROL SULFATE) 2 puffs every 4-6 hours as needed  AEROCHAMBER PLUS   MISC (SPACER/AERO-HOLDING CHAMBERS) use with MDi  PROMETHAZINE-CODEINE 6.25-10 MG/5ML  SYRP (PROMETHAZINE-CODEINE) 1 tsp by mouth every 4-6 hours as needed coughing  AUGMENTIN 875-125 MG  TABS (AMOXICILLIN-POT CLAVULANATE) 1 tab by mouth bid  ATIVAN 1 MG  TABS (LORAZEPAM) take 1 by mouth 1 hour before scan 1 by mouth right before scan  FLOVENT HFA 110 MCG/ACT AERO (FLUTICASONE PROPIONATE  HFA) 2 puffs every 12 hours  PREDNISONE 20 MG TABS (PREDNISONE) take 3 tabs by mouth daily for 3 days, 2 tabs by mouth daily for 2 days 1 tab by mouth daily for 2 days then 1/2 tab by mouth daily  PROVIGIL 200 MG TABS (MODAFINIL) 1 by mouth bid  BACTRIM DS 800-160 MG TABS (SULFAMETHOXAZOLE-TRIMETHOPRIM) 1 tab by mouth bid  NUVIGIL 250 MG TABS (ARMODAFINIL) 1 by mouth qam  BACLOFEN 10 MG TABS (BACLOFEN) 1 by mouth by mouth tid  * SOLU MEDROL 1 GRAM IV therapy for 3 days  * AQUA THERAPY 3 times a week  Dx:  Multiple Sclerosis  FLAGYL 500 MG TABS (METRONIDAZOLE) 1 by mouth bid        Vital Signs:   Ht: 64 in.  Wt: 242 lbs.      BMI: 41.69  BSA: 2.12  Wt chg (lbs): 2  Temperature: 98.2  degrees F  oral    Patient appears to be in acute distress: no  BP: 122/78    Intake recorded by: Josefa Half MA on May 14, 2010 1:16 PM    History of Present Illness   finished zpak, feeling much  better, but still having cough with occas yellow sputum. with pleuritic cp. otc helping. no f/c/n/v/d/c. some lightheadedness. was taking flagyl at same time as zpak, unknown to me until now.           Physical Examination:   BP: 122/  78    Physical Exam- Detail:   General Appearance: Abnormal -obese  Eyes: Sclera white, conjunctiva without injection and pallor.  PERRLA.  EOMI  Nose/Face: Abnormal -mild infl  Oropharynx: Normal appearance.  No erythema, exudate or mass. No tonsillar swelling.  Respiratory: Respiration un-labored.  Lung fields clear to auscultation.  No wheezing, rales, rhonchi or pleural rub.  Neck: No thyromegaly.  No nodules, masses or tenderness.  Cardiac: S1 and S2 normal.  RRR without murmurs, rubs, gallops.  No JVD.  Vascular: No carotid bruits.  No edema or varicosities.  Abdomen: No masses or tenderness. Bowel sounds active x4 quad.  Liver and spleen are without tenderness or enlargement.  No hernias.           New Medications:  PHENERGAN VC/CODEINE 6.25-5-10 MG/5ML SYRUP (PHENYLEPH-PROMETHAZINE-COD) one teaspoon by mouth every four hours as needed for cough  *  Preventive Maintenance       Coordinating Care Providers   PCP Name: [name], MD            Prescriptions:  ADVIL COLD/SINUS  TABS (PSEUDOEPHEDRINE-IBUPROFEN TABS) 1-2 by mouth q6h as needed for cough/congestion  #40 x 1   Entered and Authorized by: Jamelle Rushing MD   Signed by: Jamelle Rushing MD on 05/14/2010   Method used: Print then Give to Patient   RxID: [id number]  PHENERGAN VC/CODEINE 6.25-5-10 MG/5ML SYRUP (PHENYLEPH-PROMETHAZINE-COD) one teaspoon by mouth every four hours as needed for cough  #121ml x 0   Entered and Authorized by: Jamelle Rushing MD   Signed by: Jamelle Rushing MD on 05/14/2010   Method used: Print then Give to Patient   RxID: [id number]      Assessment and Plan     Problems   Status of Existing Problems:  Assessed BRONCHITIS, ACUTE WITH BRONCHOSPASM as improved -  lungs clear now, but still coughing. finished zpak just a couple days ago, so still in system. rob dm during waking hours, phen with codeine otherwise, supportive care, f/u pc next week. Jamelle Rushing MD    Medications   New Prescriptions/Refills:  ADVIL COLD/SINUS  TABS (PSEUDOEPHEDRINE-IBUPROFEN TABS) 1-2 by mouth q6h as needed for cough/congestion  #40 x 1, 05/14/2010, Jamelle Rushing MD  PHENERGAN VC/CODEINE 6.25-5-10 MG/5ML SYRUP (PHENYLEPH-PROMETHAZINE-COD) one teaspoon by mouth every four hours as needed for cough  #190ml x 0, 05/14/2010, Jamelle Rushing MD    Today's Orders   [phone] - Ofc Vst, Est Level III [[id number]]                  Preventive Maintenance       ]

## 2010-06-10 NOTE — Unmapped (Signed)
Signed by Sharyne Peach RMA on 06/10/2010 at 17:16:41    Prescriptions:  LISINOPRIL-HYDROCHLOROTHIAZIDE 20-25 MG TABS (LISINOPRIL-HYDROCHLOROTHIAZIDE) 1 by mouth daily  #30 x 3   Entered by: Sharyne Peach RMA   Authorized by: Simeon Craft MD   Signed by: Sharyne Peach RMA on 06/10/2010   Method used: Handwritten   RxID: [id number]

## 2010-06-15 NOTE — Unmapped (Signed)
Signed by Seymour Bars MA on 06/16/2010 at 14:58:14    PHONE NOTE  Call back at Home Phone: [phone]  Caller: patient  Department: Neurology  Call for: [facility]    Reason for Call: pt wants to speak with MA about starting new medication      Initial call taken by: Juliette Alcide Rippy,  June 15, 2010 11:52 AM      FOLLOW UP  Patient went to one of Dr. Grayland Jack lecture and wondering if she would be a canidate for the Ampyra?  Follow-up by:  Seymour Bars MA,  June 15, 2010 12:45 PM    FOLLOW UP  Would like to discuss with pt at next visit - will require paper work being completed and timed walk test prior to prescribing  Follow-up by:  Buel Ream CNP,  June 16, 2010 2:54 PM    FOLLOW UP  Left message to patient to schedule appt to see [facility]  Follow-up by:  Seymour Bars MA,  June 16, 2010 2:58 PM

## 2010-06-26 ENCOUNTER — Encounter

## 2010-07-08 LAB — CBC AND DIFFERENTIAL
Basophils Absolute: 0 10*3/uL (ref 0.0–0.2)
Basophils Relative: 0.2 % (ref 0–3)
Eosinophils Absolute: 0.2 10*3/uL (ref 0.0–0.4)
Eosinophils Relative: 1.9 % (ref 0–7)
Hematocrit: 45 % (ref 34–44)
Hemoglobin: 14.7 g/dL (ref 11.5–15.0)
Lymphocytes Absolute: 2.7 10*3/uL (ref 0.7–4.5)
Lymphocytes Relative: 25.3 % (ref 14–46)
MCH: 30.5 pg (ref 27–34)
MCHC: 32.7 g/dL (ref 32–36)
MCV: 93 fL (ref 80–98)
Monocytes Absolute: 0.8 (ref 0.1–1.0)
Monocytes Relative: 7.7 % (ref 4–13)
Neutrophils Absolute: 7 10*3/uL (ref 1.8–7.8)
Neutrophils Relative: 64.7 % (ref 40–74)
Platelets: 247 10*3/uL (ref 140–415)
RBC: 4.82 10*6/uL (ref 3.8–5.1)
RDW: 12.9 % (ref 11.7–15.0)
WBC: 10.8 10*3/uL (ref 4.0–10.5)

## 2010-07-08 LAB — RENAL FUNCTION PANEL W/EGFR
Albumin: 4.2 g/dL (ref 3.5–5.5)
BUN/Creatinine Ratio: 16 (ref 8–27)
BUN: 9 mg/dL (ref 5–26)
CO2: 30 mmol/L (ref 20–32)
Calcium: 9.7 mg/dL (ref 8.4–10.5)
Chloride: 96 mmol/L (ref 97–108)
Creatinine: 0.55 mg/dL (ref 0.57–1.00)
GFR MDRD Af Amer: 140.12 mL/min (ref >59)
GFR MDRD Non Af Amer: 115.61 mL/min (ref >59)
Glucose: 146 mg/dL (ref 65–99)
Phosphorus: 4.1 mg/dL (ref 2.5–4.5)
Potassium: 3.5 mmol/L (ref 3.5–5.2)
Sodium: 138 mmol/L (ref 135–145)

## 2010-07-08 LAB — TSH: TSH: 2.33 microintl units/mL (ref 0.450–4.500)

## 2010-07-08 LAB — VITAMIN B12: Vitamin B-12: 574 pg/mL (ref 211–946)

## 2010-07-08 LAB — SED RATE: Sed Rate: 67 mm/hr (ref 0–20)

## 2010-07-08 NOTE — Unmapped (Signed)
Signed by Generic  UCP Provider on 07/08/2010 at 00:00:00  Ampyra      Imported By: Maryellen Pile 07/13/2010 10:58:38    _____________________________________________________________________    External Attachment:    Please see [facility] EMR for this document.

## 2010-07-08 NOTE — Unmapped (Signed)
Signed by Buel Ream CNP on 07/09/2010 at 10:15:35  Patient: Melanie [name]  Note: All result statuses are Final unless otherwise noted.    Tests: (1) Sedimentation Rate-Westergren ([id number])    Order Note: Performing Site: [facility] [address]   [place] [address].   Sedimentation Rate-Westergren                         [H]  67 mm/hr                    (0-20)    Note: An exclamation mark (!) indicates a result that was not dispersed into   the flowsheet.  Document Creation Date: 07/09/2010 12:18 AM  _______________________________________________________________________    (1) Order result status: Final  Collection or observation date-time: 07/08/2010 15:09  Requested date-time: 07/08/2010 15:09  Receipt date-time: 07/08/2010 15:09  Reported date-time: 07/09/2010 00:09:00  Referring Physician:    Ordering Physician:  Letitia Libra ([email or web address])  Specimen Source:   Source: Leverne Humbles Order Number: [id number])  Lab site:

## 2010-07-08 NOTE — Unmapped (Signed)
Signed by Buel Ream CNP on 07/09/2010 at 10:16:05  Patient: Melanie [name]  Note: All result statuses are Final unless otherwise noted.    Tests: (1) CBCwDiffwPlt ([id number])    Order Note: Performing Site: [facility] [address]   [place] [address].    WBC                  [H]  10.8 x10E3/uL               (4.0-10.5)    RBC                       4.82 x10E6/uL               (3.8-5.1)    Hemoglobin                14.7 g/dL                   (53.6-14.4)    Hematocrit           [H]  45.0 %                      (34-44)    MCV                       93 fL                       (80-98)    MCH                       30.5 pg                     (27-34)    MCHC                      32.7 g/dL                   (31-54)    RDW                       12.9 %                      (11.7-15.0)    Platelets                 247 x10E3/uL                (140-415)    Neutrophils               64.7 %                      (40-74)    Lymphocytes               25.3 %                      (14-46)    Monocytes                 7.7 %                       (4-13)    Eosinophils               1.9 %                       (  0-7)    Basophils                 0.2 %                       (0-3)   Neutrophils (Absolute)                              7.0 x10E3/uL                (1.8-7.8)   Lymphocytes (Absolute)                              2.7 x10E3/uL                (0.7-4.5)    Monocytes(Absolute)       0.8 x10E3/uL                (0.1-1.0)   Eosinophils (Absolute)                              0.2 x10E3/uL                (0.0-0.4)    Basophils (Absolute)      0.0 x10E3/uL                (0.0-0.2)    Note: An exclamation mark (!) indicates a result that was not dispersed into   the flowsheet.  Document Creation Date: 07/08/2010 11:08 PM  _______________________________________________________________________    (1) Order result status: Final  Collection or observation date-time: 07/08/2010 15:09  Requested date-time: 07/08/2010  15:09  Receipt date-time: 07/08/2010 15:09  Reported date-time: 07/08/2010 22:58:00  Referring Physician:    Ordering Physician:  Letitia Libra ([email or web address])  Specimen Source:   Source: Leverne Humbles Order Number: [id number])  Lab site:       -----------------    The following lab values were dispersed to the flowsheet  with no units conversion:      WBC, 10.8 X10E3/UL, (F)  expected units: 10*3/mm3    RBC, 4.82 X10E6/UL, (F)  expected units: 10*6/mm3    Platelets, 247 X10E3/UL, (F)  expected units: 10*3/mm3    Neutrophils (Absolute), 7.0 X10E3/UL, (F)  expected units: K/uL    Lymphocytes (Absolute), 2.7 X10E3/UL, (F)  expected units: K/uL    Eosinophils (Absolute), 0.2 X10E3/UL, (F)  expected units: 10*3/mm3    Basophils (Absolute), 0.0 X10E3/UL, (F)  expected units: K/uL

## 2010-07-08 NOTE — Unmapped (Signed)
Signed by Buel Ream CNP on 07/09/2010 at 15:33:53    0pMultiple Sclerosis Visit      History of Present Illness- Multiple Sclerosis:   Chief Complaint: MS Follow-up  History From: patient  Date of Diagnosis: 2004  Diagnosed by: MRI  Diagnosis: RRMS    Course of Disease:   2000 - started with forgetfullness and fatigue. Has always worked nights in hospital.   2003 - dizziness and spinning sensation  2004 - optic neuritis - saw neuro- ophthamology- MRI completed  Over years have had paresthesias of legs as well        Current Disease Course:   no regular ambulatory devices  EDSS: 3.0-Moderate disbility in one FS or mild disability in three or four FS.  Fully ambulatory.    Handedness: right    HPI:    54 yo AAF with RRM on Rebif  Interested in trying Ampyra for fatigue and walking speed.  Finds it difficult to get up from sitting after a long time. Complains of stiffness and balance problems.  Denies history of seizure or kidney problems  Takes Provigil for fatigue. Keeps her awake, but remains fatigued.  Nuvigil did not work at low dose, and too much at higher dose.  Last MRI 2/11 with stable WMD brain and no lesions in C-Spine       PAST HISTORY  Past Medical History (reviewed - no changes required):  Asthma, Multiple Sclerosis, Dx 2000, Hypertension  Social History (reviewed - no changes required): Language: English,   Marital Status: divorced,   Employment Status: employed full-time,   Occupation: Teacher, music  Patient Lives at: apartment,   Support System: adequate  Caffeine per Day: 2  Alcohol Use: occasionally  Drug Use: none  Tobacco Usage:non-smoker  Passive Smoke Exposure: yes    Review of Systems   General: Complains of fatigue. Denies fevers, chills, sweats, insomnia.   Eyes: Complains of blurring, vision loss. Denies diplopia, eye pain.   Ears/Nose/Throat: Denies any specific issues at this time.   Cardiovascular: Denies any specific issues at this time.   Respiratory: Complains of dyspnea,  wheezing, productive cough. Denies any specific issues at this time.   Gastrointestinal: Denies nausea, vomiting, diarrhea, constipation.   Genitourinary: Complains of urinary frequency, nocturia, incomplete empty.   Musculoskeletal: Complains of joint pain, muscle cramps, muscle pains, stiffness.   Skin: Denies any specific issues at this time.   Neurologic: Complains of paresthesias, poor coordination, word finding problems, concentration problems, memory problems, gait difficulties. Denies falls.   Psychiatric: Denies any specific issues at this time.   Endocrine: Denies any specific issues at this time.   Heme/Lymphatic: Denies any specific issues at this time.   Allergic/Immunologic: Denies any specific issues at this time.       Intake-Neurology   Chief Complaint: MS Follow-up  Handedness: right    Vital Signs   Height: 64 inches    Allergies  ! COMPAZINE      Previous Screening:   Screening for unhealthy alcohol use performed. (01/28/2010 3:59:20 PM)      Diagnostic Testing:   MRI History 2004 per Dr Milas Kocher notation - noticeable black holes with moderate to severe lesion load.  Additional MRI History 6/09 MRI brain - mild increase in WML  no CEL  6/09 MRI C-spine - no WMD noted      Timed 25-Foot Walk- Lower Extremity Function     1.  Date MSFC assessment performed: 07/08/2010  2.  Did the  patient wear an ankle-foot orthosis (AFO)? No  3.  Was an assistive device used? No    [address]   [address]  4a. Time for a 25-Foot Walk (seconds): 8.4      Physical Exam-Neurology   Appearance: Abnormal -obese  Language/Speech: no aphasia and no dysarthria  Atten/concentration: awake and alert  Orientation: oriented x3  Memory: grossly intact  Fund of Knowledge: grossly intact  Ophthalmoscopic: no papilledema/good venous pulsations  Pupils: Sclera white, conjunctiva without injection and pallor.  PERRLA.  EOMI  Visual Fields:   normal  CN 3,4,6 (EOM): extraocular movements intact, normal gaze alignment, and  no nystagmus  Cranial Nerve 5 (Trigeminal): bilateral facial sensation normal  Cranial Nerve 7 (Facial): normal facial symmetry and strength  CN 8 (Auditory): proper hearing to finger rub bilaterally  Cranial Nerve 9 (Glossophar): soft palate elevates in the midline spontaneously  CN 11 (spinal access): full shoulder shrug equally  Cranial Nerve 12 (Hypoglossal): tongue is midline on protrusion    General:   Head and Neck no lymphadenopathy, carotids 2+/no bruits, nl thyroid gland  Pulmonary: clear to auscultation  CVS: RRR, nl S1 S2, no S3 S4, no M  Extremities: no edema, cyanosis, clubbing, pulses 2+    Muscle Strength     Right Upper Extremity   Shoulder abductor (right): 5/5  Elbow flexor (right): 5/5  Elbow extensor (right): 5/5  Wrist Flexors (right): 5/5  Wrist Extensors (right): 5/5  Finger Flexors (right): 5/5  Interosseous (right): 5/5  APB (right): 5/5    Left Upper Extremity   Shoulder abductor (left): 5/5  Elbow flexor (left): 5/5  Elbow extensor (left): 5/5  Wrist Flexors (left): 5/5  Wrist Extensors (left): 5/5  Finger Flexors (left): 5/5  Interosseous (left): 5/5  APB (left): 5/5    Right Lower Extremity   Hip Flexion (right): 5/5  Hip Adduction (right): 5/5  Hip Abduction (right): 5/5  Knee extension (right): 5/5  Knee Flexion (right): 5/5  Ankle Dorsi Flexor (right): 5/5  Pedal Flexor (right): 5/5  Ankle Inverter (right): 5/5  Ankle Everter (right): 5/5    Left Lower Extremity   Hip Flexion (left): 5/5  Hip Adduction(left): 5/5  Hip Abduction (left): 5/5  Knee extension (left): 5/5  Knee Flexion (left): 5/5  Ankle Dorsi Flexor (left): 5/5  Pedal Flexors (left): 5/5  Ankle Inverter (left): 5/5  Ankle Everter (left): 5/5    Muscle Tone Examination     Right Upper Extremity   Right Upper Extremity: normal    Left Upper Extremity   Left Upper Extremity: normal    Right Lower Extremity   Right Lower Extremity: normal    Left Lower Extremity   Left Lower Extremity: normal    Reflexes     Right   Biceps  (right): decreased  Triceps (right): decreased  Brachio Radialis (right): decreased  Patellar (right): decreased  Ankle Jerk (right): decreased    Left   Biceps (left): decreased  Triceps (left): decreased  Brachio Radialis (left): decreased  Patellar (left): decreased  Ankle Jerk (left): decreased    Sensory Examination     Right Upper Extremity   Light Touch- Right Upper Extremity normal     Left Upper Extremity   Light Touch-Left Upper Extremity normal     Right Lower Extremity   Light Touch- Right Lower Extremity normal     Left Lower Extremity   Light Touch- Left Lower Extremity normal     Coordination/  Station & Gait:   Rapid alternating movements (right): normal  Finger to nose (right):normal  Heel to shin (right): normal  Finger Tapping (right): normal  Rapid alternating movements (left):slow  Finger to nose (left):normal  Heel to shin (left): normal  Finger Tapping (left): normal  Romberg: positive  Station/Gait: antalgic      Assessment    54 yo AAF with RRMS interested in starting Ampyra for walking speed and she hopes it will help her fatigue  Pt is aware of  12 hour dosing schedule.  She does not have h/o seizure disorder or renal dysfunction.   Pt informed that 30% of patients respond to this medication.      Plan    Start Ampyra  Labs:  CBC, Renal, LFT, TSH, T4, B12, Folic Acid, ESR  Brochure for polar products  F/U 2 mos with NP   Today's Orders   Folic Acid  (FOLATE) (466) [ZOX-09604]  B-12  (B12)  (927) [*CPT-82607]  TSH + FT4 reflex    (TSHRFX) (54098)  [JXB-14782]  Sedimentation Rate   (ESR) (809) [CPT-85651]  CBC + plat + Diff  (9562) [*CPT-85025]  Renal Function Panel   (KIDNEY) (10314) [CPT-80069]  99214 - Ofc Vst, Est Level IV;  25 min [[id number]]    Medical Decision Making:   * review/order clinical lab tests  * Permanent chart problem/surgery list reviewed  * Permanent chart chronic med/ allergy list reviewed  * Permanent chart social/family history reviewed    Risks & Benefits:   *  Risks, benefits and treatment options discussed with patient.    Is visit primarily counseling/coordination? yes  Risk of complications: moderate    Time spent with patient:   Established:    25 min  More than 50% of this time was spent discussing:   * Management  * Treatment Plan                    Process Orders  Check Orders Results:      EMR Link Lab: ABN not required for this insurance  Tests Sent for requisitioning (July 09, 2010 3:33 PM):      07/08/2010: EMR Link Lab -- Folic Acid  (FOLATE) (466) [[id number]] (signed)      07/08/2010: EMR Link Lab -- B-12  (B12)  (927) [*CPT-82607] (signed)      07/08/2010: EMR Link Lab -- TSH + FT4 reflex    (TSHRFX) [phone])  [[id number]] (signed)      07/08/2010: EMR Link Lab -- Sedimentation Rate   (ESR) (809) [CPT-85651] (signed)      07/08/2010: EMR Link Lab -- CBC + plat + Diff  (4401) [*CPT-85025] (signed)      07/08/2010: EMR Link Lab -- Renal Function Panel   (KIDNEY) (10314) [CPT-80069] (signed)    ]

## 2010-07-08 NOTE — Unmapped (Signed)
Signed by Buel Ream CNP on 07/09/2010 at 10:15:05  Patient: Melanie [name]  Note: All result statuses are Final unless otherwise noted.    Tests: (1) Vitamin B12 [phone])    Order Note: [address] - [address]  [place], [place] [id number]   [id number] CLIA #:[id number]    Vitamin B12               574 pg/mL                   [phone])    Note: An exclamation mark (!) indicates a result that was not dispersed into   the flowsheet.  Document Creation Date: 07/09/2010 8:38 AM  _______________________________________________________________________    (1) Order result status: Final  Collection or observation date-time: 07/08/2010 15:09  Requested date-time: 07/08/2010 15:09  Receipt date-time: 07/08/2010 15:09  Reported date-time: 07/09/2010 08:26:00  Referring Physician:    Ordering Physician:  Letitia Libra ([email or web address])  Specimen Source:   Source: Leverne Humbles Order Number: [id number])  Lab site:

## 2010-07-08 NOTE — Unmapped (Signed)
Signed by Buel Ream CNP on 07/09/2010 at 10:15:05  Patient: Melanie [name]  Note: All result statuses are Final unless otherwise noted.    Tests: (1) TSH reflex to T4F [phone])    Order Note: Performing Site: [facility] [address]   [place] [address].    TSH                       2.330 uIU/mL                (0.450-4.500)    Note: An exclamation mark (!) indicates a result that was not dispersed into   the flowsheet.  Document Creation Date: 07/08/2010 11:33 PM  _______________________________________________________________________    (1) Order result status: Final  Collection or observation date-time: 07/08/2010 15:09  Requested date-time: 07/08/2010 15:09  Receipt date-time: 07/08/2010 15:09  Reported date-time: 07/08/2010 23:23:00  Referring Physician:    Ordering Physician:  Letitia Libra ([email or web address])  Specimen Source:   Source: Leverne Humbles Order Number: [id number])  Lab site:

## 2010-07-08 NOTE — Unmapped (Signed)
Signed by Buel Ream CNP on 07/09/2010 at 10:15:05  Patient: Melanie [name]  Note: All result statuses are Final unless otherwise noted.    Tests: (1) Folate (Folic Acid), Serum ([id number])    Order Note: [address] - [address]  [place], [place] [id number]   [id number] CLIA #:[id number]  ! Folate (Folic Acid), Serum                              9.0 ng/mL                   (>3.0-)                                                 Indeterminate:  2.2 - 3.0                                                 Deficient:           <2.2    Note: An exclamation mark (!) indicates a result that was not dispersed into   the flowsheet.  Document Creation Date: 07/09/2010 8:38 AM  _______________________________________________________________________    (1) Order result status: Final  Collection or observation date-time: 07/08/2010 15:09  Requested date-time: 07/08/2010 15:09  Receipt date-time: 07/08/2010 15:09  Reported date-time: 07/09/2010 08:26:00  Referring Physician:    Ordering Physician:  Letitia Libra ([email or web address])  Specimen Source:   Source: Leverne Humbles Order Number: [id number])  Lab site:

## 2010-07-08 NOTE — Unmapped (Signed)
Signed by Buel Ream CNP on 07/09/2010 at 10:16:35  Patient: Melanie [name]  Note: All result statuses are Final unless otherwise noted.    Tests: (1) Renal Panel (10) (161096)    Order Note: Performing Site: [facility] [address]   [place] [address].    Glucose, Serum       [H]  146 mg/dL                   (98-11)    BUN                       9 mg/dL                     (9-14)    Creatinine, Serum    [L]  0.55 mg/dl                  (7.82-9.56)    BUN/Creatinine Ratio      16                          (8-27)    Glom Filt Rate, Est       115.61 mL/min               (>59-)    If African American       140.12 mL/min               (>59-)    Sodium, Serum             138 mmol/L                  (135-145)    Potassium, Serum          3.5 mmol/L                  (3.5-5.2)    Chloride, Serum      [L]  96 mmol/L                   (97-108)   Carbon Dioxide, Total                              30 mmol/L                   (20-32)    Calcium, Serum            9.7 mg/dL                   (2.1-30.8)      Please note: The calcium reference range has been updated as of       02/07/2010.    Albumin, Serum            4.2 g/dL                    (6.5-7.8)    Phosphorus, Serum         4.1 mg/dL                   (4.6-9.6)    Note: An exclamation mark (!) indicates a result that was not dispersed into   the flowsheet.  Document Creation Date: 07/08/2010 11:33 PM  _______________________________________________________________________    (1) Order result status: Final  Collection or observation date-time: 07/08/2010 15:09  Requested date-time: 07/08/2010 15:09  Receipt date-time: 07/08/2010 15:09  Reported date-time: 07/08/2010 23:23:00  Referring Physician:    Ordering Physician:  Letitia Libra [facility])  Specimen Source:   Source: Leverne Humbles Order Number: [id number])  Lab site:

## 2010-07-09 NOTE — Unmapped (Signed)
Signed by Buel Ream CNP on 07/09/2010 at 00:00:00  [employer]      Imported By: Maryellen Pile 07/13/2010 09:33:54    _____________________________________________________________________    External Attachment:    Please see [employer] EMR for this document.

## 2010-07-15 NOTE — Unmapped (Signed)
Signed by Lyla Son on 07/15/2010 at [facility]         Neurology         [address], [address]         [place], [place] [address]         p [phone] f [phone]         [email or web address]    July 08, 2010      [name], M.D.    RE: Melanie [name]  DOB:   May 11, 1956    Dear Dr. Vanita Panda:    I had the pleasure of seeing your patient, Melanie [name], in consultation in the [facility] on 07/08/2010.     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 Interested in trying Ampyra for fatigue and walking speed.  Finds it difficult to get up from sitting after a long time. Complains of stiffness and balance problems. Denies history of seizure or kidney problems.     Takes Provigil for fatigue. Keeps her awake, but she remains fatigued.  Nuvigil did not work at low dose, and too much at higher dose.    Last MRI 2/11 with stable white matter disease in brain and no lesions in C-spine.    Onset:  2004 Diagnosis:   RRMS    Course of Disease:  2000 - started with forgetfullness and fatigue. Has always worked nights in hospital.   2003 - dizziness and spinning sensation  2004 - optic neuritis - saw neuro- ophthamology- MRI completed  Over years have had paresthesias of legs as well    Current Disease Course:  no regular ambulatory devices    Impression:    54 year old AAF with RRMS interested in starting Ampyra for walking speed and she hopes it will help her fatigue. Patient is aware of 12-hour dosing schedule.  She does not have h/o seizure disorder or renal dysfunction. Patient informed that 30% of patients respond to this medication.       Plan:  Start Ampyra  Labs:  CBC, Renal, LFT, TSH, T4, B12, Folic Acid, ESR  Brochure for polar products  F/U 2 months with NP.      Thank you for allowing me to participate in the care of this pleasant patient.  If you would like a copy of my office  note, please contact our Medical Records department at [phone].  Please feel free to contact me should you have any concerns or questions.    Sincerely,    Letitia Libra, CNP

## 2010-07-15 NOTE — Unmapped (Signed)
Signed by Buel Ream CNP on 07/15/2010 at 00:00:00  Explanation of Benefits      Imported By: Coletta Memos 07/24/2010 00:00:30    _____________________________________________________________________    External Attachment:    Please see [employer] EMR for this document.

## 2010-07-24 ENCOUNTER — Ambulatory Visit

## 2010-07-27 NOTE — Unmapped (Signed)
Signed by Salomon Fick MA on 07/27/2010 at 15:37:56    Prescriptions:  PROVIGIL 200 MG TABS (MODAFINIL) 1 by mouth bid  #60 x 5   Entered by: Salomon Fick MA   Authorized by: Nestor Lewandowsky MD   Signed by: Salomon Fick MA on 07/27/2010   Method used: Telephoned to ...     [facility] (retail)     [address], [place]  [address]     Ph: [phone]     Fax: [phone]   RxID: [id number]

## 2010-08-21 ENCOUNTER — Ambulatory Visit

## 2010-08-26 NOTE — Unmapped (Signed)
Signed by Seymour Bars MA on 08/26/2010 at 15:21:22    PHONE NOTE  Caller's Cell Phone #: [phone]  Caller: patient  Department: Neurology  Call for: [facility]    Reason for Call: expected a call from MA after talk to ins about a new med - needs some fmla info      Initial call taken by: Young Berry,  August 26, 2010 3:02 PM      FOLLOW UP  Number given to [employer]-  Follow-up by:  Seymour Bars MA,  August 26, 2010 3:21 PM

## 2010-09-10 NOTE — ED Provider Notes (Unsigned)
PATIENT NAME:                 PA #:            MR #AMRI, LIEN                  [id number]       [id number]            EMERGENCY ROOM PHYSICIAN:                ADM DATE:                    Burr Medico, MD                        09/10/2010                   DATE OF BIRTH:   AGE:           PATIENT TYPE:     RM #:              1956-10-25       53                                                     REASON FOR VISIT:   Possible allergic reaction.     HISTORY OF PRESENT ILLNESS:   A 54 year old states she has been on a new  medication for MS states Ampyra that she has been on for 4 days. States it  started about 4 hours ago.  She started to get stiffness to the right side of  her neck that is kind of going around to her face.  She states a similar  thing happened when she was on Compazine in the past. She states now when she  swallows she feels pain in the back of her throat but does not hurt without  swallowing.  No headaches, no fever or chills, no nausea or vomiting, no  cough or congestion.  Denies rashes, shortness of breath, no abdominal pain.         PAST MEDICAL HISTORY:  Hypertension, asthma and MS.      MEDICATIONS:  Ampyra, lisinopril, albuterol.      ALLERGIES:  COMPAZINE.      SOCIAL HISTORY:   The patient does not smoke, does not drink.     FAMILY HISTORY:   Noncontributory.     REVIEW OF SYSTEMS:  As above.    CONSTITUTION:  No fever or chills.  HEENT:  No headaches, no ears, nose and throat pain but does have some  discomfort on the right side of her throat when she swallows.  Complains of  pain to the right side of her neck.  No swelling.  Denies chest pain or  palpitations.  Denies shortness of breath.  Denies abdominal pain.     PHYSICAL EXAMINATION:    VITAL SIGNS:  Temperature 97.5, pulse 82, respirations are 18, blood pressure  134/88, 98% on room air.    GENERAL: She is awake, alert, well-developed, well-nourished.  She is in no  obvious distress.  HEENT: Normocephalic,  atraumatic.  Pupils are equal and reactive.  Ears, nose  and throat were clear.  She did have some discomfort on the right side when  she would open her mouth all the way.  NECK:  On her neck she appeared fairly mobile but she had discomfort when she  would attempt to look full rotation to the right.  Pain was along the  sternocleidomastoid.  No stiffness.    HEART: Regular rate and rhythm.   LUNGS: Clear.  ABDOMEN:  Soft, nontender, nondistended, positive bowel sounds.  The rest of the exam is unremarkable.     ASSESSMENT/PLAN:  A 54 year old with possible adverse reaction to Ampyra.  I  contacted Dr. ______ for the neurologist on call at [facility]. resident.  She is  not real familiar with the drug but recommended holding it, starting her on  Benadryl.  The patient does not otherwise appear in any distress.                                              Burr Medico, MD     [id number]  DD: 09/10/2010 22:26   DT: 09/10/2010 23:30   Job #: [id number]  CC:

## 2010-09-11 NOTE — Unmapped (Signed)
Signed by Buel Ream CNP on 09/17/2010 at 14:34:16    PHONE NOTE  Call back at Home Phone: [phone]  Caller's Cell Phone #: [phone]  Caller: patient  Department: Neurology  Call for: Dainel Arcidiacono    Reason for Call: started new med (can't provide name) last Sun, thinks she had allergic reaction - instructed to f/u w/Dr [facility]      Initial call taken by: Young Berry,  September 11, 2010 10:44 AM      FOLLOW UP  Spoke with patient and she advised me that she thinks the reaction was from Ampyra. Had numbness in lip, mouth, trouble chewing, eating because of numbness. She did got to [facility] ED where she was treated and released. She feels better today. Would like to know should she move her appointment up.   Follow-up by:  Venetia Night MA,  September 11, 2010 11:58 AM    FOLLOW UP  no need to be seen earlier - has she stopped Ampyra?  The drug only stays in the blood system for 12 hours.  If she is better, just watch symptoms  Follow-up by:  Buel Ream CNP,  September 14, 2010 4:50 PM    FOLLOW UP  I spoke with Melanie [name] and she has stopped the Ampyra and the numbness has subsided but now she is experiencing extreme tiredness and right sided body pain. The last time she took the benadryl was in the ER.  Follow-up by:  Imelda Pillow MA,  September 15, 2010 8:43 AM    FOLLOW UP  left message to call back  Follow-up by:  Buel Ream CNP,  September 16, 2010 3:31 PM

## 2010-12-07 NOTE — Unmapped (Signed)
Signed by Salomon Fick MA on 12/07/2010 at 15:13:20    PHONE NOTE  Caller's Cell Phone #: [phone]  Caller: patient  Department: Neurology  Call for: [facility]    Reason for Call: pt has been in a lot of pain for last week or 2      Initial call taken by: Young Berry,  December 07, 2010 1:30 PM      FOLLOW UP  PT said that the pain is all over her body.  PT said that last year she was off work around [employer] time and the year before last she was off work .   She said that it is a constant ache and hurts to walk.    Follow-up by:  Salomon Fick MA,  December 07, 2010 2:36 PM    FOLLOW UP  pt will need to make an appt to be seen  Follow-up by:  Buel Ream CNP,  December 07, 2010 3:00 PM    FOLLOW UP  PT said that she has fatigue & a headache that isn't constant.  She is scheduled for Thursday 12/10/10 3:30 PM   Follow-up by:  Salomon Fick MA,  December 07, 2010 3:13 PM

## 2010-12-10 ENCOUNTER — Inpatient Hospital Stay

## 2010-12-10 NOTE — Unmapped (Signed)
Signed by Buel Ream CNP on 12/10/2010 at 11:08:48    Multiple Sclerosis Visit      History of Present Illness- Multiple Sclerosis:   Chief Complaint: not feeling well   History From: Melanie [name]  Date of Diagnosis: 2004  Diagnosis: RRMS    Course of Disease:   2000 - started with forgetfullness and fatigue. Has always worked nights in hospital.   2003 - dizziness and spinning sensation  2004 - optic neuritis - saw neuro- ophthamology- MRI completed  Over years have had paresthesias of legs as well        Current Disease Course:   no regular ambulatory devices    Handedness: right    HPI:    34 o AAF with RRMS on Rebif  Currently thinks she is having an attack - hands and feet started tingling with SOB, followed by extreem fatigue and body aches -started 2 weeks ago. Increase in personal stress.  Admits to joint pain including knee pain, legs aching.  Feels like she needs to use her walker at work it has gotten so bad.  Requests time off work -  with PT  Thinks Provigil is wearing off now. Only takes one in the morning.   PAST HISTORY  Past Medical History (reviewed - no changes required):  Asthma, Multiple Sclerosis, Dx 2000, Hypertension  Social History (reviewed - no changes required): Language: English,   Marital Status: divorced,   Employment Status: employed full-time,   Occupation: Teacher, music  Melanie [name] Lives at: apartment,   Support System: adequate  Caffeine per Day: 2  Alcohol Use: occasionally  Drug Use: none  Tobacco Usage:non-smoker  Passive Smoke Exposure: yes    Review of Systems   General: Complains of fatigue. Denies fevers, chills, sweats, insomnia.   Eyes: Complains of blurring, vision loss. Denies diplopia, eye pain.   Ears/Nose/Throat: Denies any specific issues at this time.   Cardiovascular: Denies any specific issues at this time.   Respiratory: Complains of dyspnea, wheezing, productive cough. Denies any specific issues at this time.   Gastrointestinal: Denies nausea, vomiting, diarrhea,  constipation.   Genitourinary: Complains of urinary frequency, nocturia, incomplete empty.   Musculoskeletal: Complains of joint pain, muscle cramps, muscle pains, stiffness.   Skin: Denies any specific issues at this time.   Neurologic: Complains of paresthesias, poor coordination, word finding problems, concentration problems, memory problems, gait difficulties. Denies falls.   Psychiatric: Complains of depression, anxiety.   Endocrine: Denies any specific issues at this time.   Heme/Lymphatic: Denies any specific issues at this time.   Allergic/Immunologic: Denies any specific issues at this time.       Intake-Neurology   Chief Complaint: not feeling well   Handedness: right    Vital Signs   Height: 64 inches  Weight: 238.6 pounds  Pulse rate: 80 Pulse rhythm: regular Blood Pressure: Standard    BP #1: 122 / 76mm Hg  Cuff Size: Lg   BMI: 41.10  BSA: 2.11  Wt chg: -3.40    Allergies  ! COMPAZINE    Intake recorded by: Salomon Fick MA  December 10, 2010 10:29 AM    Medications reviewed, updated and verified with Melanie [name] or Melanie [name] representative.    Screening for unhealthy alcohol use performed.  Previous Screening:   Screening for unhealthy alcohol use performed. (01/28/2010 3:59:20 PM)  Women (Any Age) or Men (over Age 48): nondrinker    Depression Screening:     During the past month,   Have  you often been bothered by feeling down, depressed or hopeless? Melanie [name] declined  Have you often been bothered by little interest or pleasure in doing things? Melanie [name] declined      Diagnostic Testing:   MRI History 2004 per Dr Milas Kocher notation - noticeable black holes with moderate to severe lesion load.  Additional MRI History 6/09 MRI brain - mild increase in WML  no CEL  6/09 MRI C-spine - no WMD noted    Timed 25-Foot Walk- Lower Extremity Function     1.  Date MSFC assessment performed: 12/10/2010  2.  Did the Melanie [name] wear an ankle-foot orthosis (AFO)? No  3.  Was an assistive device used? No    Trail [address] 1    Completed [address]  4a. Time for a 25-Foot Walk (seconds): 16.5    Trial Attempts   6a. Did it take more than two attempts to get two successful trials? No        Physical Exam-Neurology   Appearance: Abnormal -obese  Language/Speech: no aphasia and no dysarthria  Atten/concentration: awake and alert  Orientation: oriented x3  Memory: grossly intact  Fund of Knowledge: grossly intact  Ophthalmoscopic: no papilledema/good venous pulsations  Pupils: Sclera white, conjunctiva without injection and pallor.  PERRLA.  EOMI  Visual Fields:   normal  CN 3,4,6 (EOM): extraocular movements intact, normal gaze alignment, and no nystagmus  Cranial Nerve 5 (Trigeminal): bilateral facial sensation normal  Cranial Nerve 7 (Facial): normal facial symmetry and strength  CN 8 (Auditory): proper hearing to finger rub bilaterally  Cranial Nerve 9 (Glossophar): soft palate elevates in the midline spontaneously  CN 11 (spinal access): full shoulder shrug equally  Cranial Nerve 12 (Hypoglossal): tongue is midline on protrusion    General:   Pulmonary: clear to auscultation  CVS: RRR, nl S1 S2, no S3 S4, no M  Extremities: no edema, cyanosis, clubbing, pulses 2+    Muscle Strength     Right Upper Extremity   Shoulder abductor (right): 5/5  Elbow flexor (right): 5/5  Elbow extensor (right): 5/5  Wrist Flexors (right): 5/5  Wrist Extensors (right): 5/5  Finger Flexors (right): 5/5  Interosseous (right): 5/5  APB (right): 5/5    Left Upper Extremity   Shoulder abductor (left): 5/5  Elbow flexor (left): 5/5  Elbow extensor (left): 5/5  Wrist Flexors (left): 5/5  Wrist Extensors (left): 5/5  Finger Flexors (left): 5/5  Interosseous (left): 5/5  APB (left): 5/5    Right Lower Extremity   Hip Flexion (right): 5-/5  Hip Adduction (right): 5/5  Hip Abduction (right): 5/5  Knee extension (right): 5/5  Knee Flexion (right): 5/5  Ankle Dorsi Flexor (right): 5/5  Pedal Flexor (right): 5/5  Ankle Inverter (right): 5/5  Ankle Everter (right):  5/5    Left Lower Extremity   Hip Flexion (left): 5-/5  Hip Adduction(left): 5/5  Hip Abduction (left): 5/5  Knee extension (left): 5/5  Knee Flexion (left): 5/5  Ankle Dorsi Flexor (left): 5/5  Pedal Flexors (left): 5/5  Ankle Inverter (left): 5/5  Ankle Everter (left): 5/5    Muscle Tone Examination     Right Upper Extremity   Right Upper Extremity: normal    Left Upper Extremity   Left Upper Extremity: normal    Right Lower Extremity   Right Lower Extremity: normal    Left Lower Extremity   Left Lower Extremity: normal    Reflexes     Right   Biceps (right): decreased  Triceps (right): decreased  Brachio Radialis (right): decreased  Patellar (right): decreased  Ankle Jerk (right): decreased    Left   Biceps (left): decreased  Triceps (left): decreased  Brachio Radialis (left): decreased  Patellar (left): decreased  Ankle Jerk (left): decreased    Sensory Examination     Right Upper Extremity     Pinprick- Right Upper Extremity: normal     Left Upper Extremity     Pinprick- Left Upper Extremity: normal   Other Sensory Testing: multiple trigger points exhibit pain to palpation > 12 sites bilaterally      Coordination/ Station & Gait:   Rapid alternating movements (right): normal  Finger to nose (right):normal  Rapid alternating movements (left):slow  Finger to nose (left):normal  Romberg: positive  Station/Gait: antalgic      Assessment    53 yo AAF with RRMS with exacerbation of symptoms including fatigue and pain, overall weakness  Possilbe relapse vs fibromyalgia - type constellation of symptoms.    Stress  Fatigue  (works nights)  Joint pain and muscular pain     Plan    Cont Rebif  CBC, LFT, UA, C+S Vit D  MRI brain and C-Spine  Celexa   20mg  #30 every day x 3 RF  Supplements Omega 3, Vit D  Pnysical Therapy  Work excuse - for 2 weeks.     Today's Orders   [phone] - Melanie [name] Encounter Documented Using an EHR Certified by [employer] [*CPT-G8447]  [phone] - Current Medications with Name, Dosages, Frequency and Route  Documented [*CPT-G8427]  99214 - Ofc Vst, Est Level IV [[id number]]    Medical Decision Making:   * review/order clinical lab tests  * review/order other diagnostic or tx interventions  * Permanent chart problem/surgery list reviewed  * Permanent chart chronic med/ allergy list reviewed  * Permanent chart social/family history reviewed    Risks & Benefits:   * Risks, benefits and treatment options discussed with Melanie [name].    Is visit primarily counseling/coordination? yes  Risk of complications: moderate    Time spent with Melanie [name]:   Established:    25 min  More than 50% of this time was spent discussing:   * Management  * Treatment Plan                    ]

## 2010-12-10 NOTE — Unmapped (Signed)
Signed by Salomon Fick MA on 12/16/2010 at 09:41:30    PHONE NOTE  Caller's Cell Phone #: [phone]  Caller: patient  Department: Neurology  Call for: Laural Benes    Reason for Call: pt wants to know if Dedra Skeens has left for the day yet?      Initial call taken by: Juliette Alcide Rippy,  December 10, 2010 2:57 PM      FOLLOW UP  Patient said that she was speaking to her niece that she was telling you about.  She said that her niece is having the same pain & same loccation.  Lowella Grip is her niece phone # [phone] - also a patient in practice .    Follow-up by:  Salomon Fick MA,  December 10, 2010 3:09 PM    FOLLOW UP  pt calling about herself today - has fmla questions - good # for pt today [phone]  Follow-up by:  Young Berry,  December 15, 2010 1:00 PM    FOLLOW UP  She just wanted to call & let us know that the letter to be off work is not what she needs. She said that she has FMLA form that we need to fill out instead. She is calling her HR office to fax over to [place].   Follow-up by:  Salomon Fick MA,  December 15, 2010 1:29 PM    FOLLOW UP  Patient just stopped by the office this AM w/ her FMLA forms and handicap plaque form .   Follow-up by:  Salomon Fick MA,  December 16, 2010 9:41 AM

## 2010-12-10 NOTE — Unmapped (Signed)
Signed by Salomon Fick MA on 12/10/2010 at 10:18:20    PHONE NOTE    PHONE NOTE  Call placed to: Patient  Reason for Call: confirm/change appointment  Summary of call:  [phone]  [facility] Patient can come in earlier today  Call placed by: Salomon Fick MA,  December 10, 2010 9:04 AM

## 2010-12-10 NOTE — Unmapped (Signed)
Signed by Salomon Fick MA on 12/10/2010 at 11:04:34    Prescriptions:  PROVIGIL 200 MG TABS (MODAFINIL) 1 by mouth bid  #60 x 5   Entered by: Salomon Fick MA   Authorized by: Buel Ream CNP   Signed by: Salomon Fick MA on 12/10/2010   Method used: Print then Give to Patient   RxID: [id number]  CELEXA 20 MG TABS (CITALOPRAM HYDROBROMIDE) 1 daily  #30 x 3   Entered by: Salomon Fick MA   Authorized by: Buel Ream CNP   Signed by: Salomon Fick MA on 12/10/2010   Method used: Print then Give to Patient   RxID: [id number]    Clinical Lists Changes    Medications:  Added new medication of CELEXA 20 MG TABS (CITALOPRAM HYDROBROMIDE) 1 daily - Signed  Rx of CELEXA 20 MG TABS (CITALOPRAM HYDROBROMIDE) 1 daily;  #30 x 3;  Signed;  Entered by: Salomon Fick MA;  Authorized by: Buel Ream CNP;  Method used: Print then Give to Patient  Rx of PROVIGIL 200 MG TABS (MODAFINIL) 1 by mouth bid;  #60 x 5;  Signed;  Entered by: Salomon Fick MA;  Authorized by: Buel Ream CNP;  Method used: Print then Give to Patient  Orders:  Added new Test order of CBC with Differential   (CBCD) (8469) Lamont Dowdy) - Signed  Added new Test order of Urinalysis   (UA) (7909) (CPT-81000) - Signed  Added new Test order of Urine Culture & Sensitivity ([id number]) - Signed  Added new Test order of Vitamin D, 25-Hydrodroxy  (LKGMW10) (27253) ([id number]) - Signed  Added new Test order of MRI, Brain w/ & w/o contrast ([id number]) - Signed  Added new Test order of MRI, Spine, Cervical w/ & w/o contrast ([id number]) - Signed  Added new Referral order of Physical Therapy Evaluation ([id number]) - Signed

## 2010-12-10 NOTE — Unmapped (Signed)
Signed by Salomon Fick MA on 12/10/2010 at 11:13:29    Clinical Lists Changes    Orders:  Added new Referral order of Aquatic Therapy [phone]) - Signed

## 2010-12-10 NOTE — Unmapped (Signed)
Signed by Salomon Fick MA on 12/10/2010 at 11:06:04                        [facility]         Neurology         [address], [address]         [place], [place] [address]         p [phone] f [phone]         [email or web address]    December 10, 2010        RE: AERIONNA MORAVEK  DOB:   04-26-1956        To whom it may concern:      Please excuse Kanae from work until December 29, 2010.       If you have any questions, please contact [facility] at [phone]        Sincerely,    Letitia Libra, CNP

## 2010-12-16 LAB — CBC AND DIFFERENTIAL
Basophils Absolute: 0 10*3/uL (ref 0.0–0.2)
Basophils Relative: 0 % (ref 0–3)
Eosinophils Absolute: 0.1 10*3/uL (ref 0.0–0.4)
Eosinophils Relative: 1 % (ref 0–7)
Hematocrit: 43.1 % (ref 34.0–44.0)
Hemoglobin: 14.4 g/dL (ref 11.5–15.0)
Lymphocytes Absolute: 2.6 10*3/uL (ref 0.7–4.5)
Lymphocytes Relative: 25 % (ref 14–46)
MCH: 31.4 pg (ref 27.0–34.0)
MCHC: 33.4 g/dL (ref 32.0–36.0)
MCV: 94 fL (ref 80–98)
Monocytes Absolute: 0.8 (ref 0.1–1.0)
Monocytes Relative: 8 % (ref 4–13)
Neutrophils Absolute: 6.8 10*3/uL (ref 1.8–7.8)
Neutrophils Relative: 66 % (ref 40–74)
Platelets: 298 10*3/uL (ref 140–415)
RBC: 4.59 10*6/uL (ref 3.80–5.10)
RDW: 13 % (ref 11.7–15.0)
WBC: 10.3 10*3/uL (ref 4.0–10.5)

## 2010-12-16 NOTE — Unmapped (Signed)
Signed by Buel Ream CNP on 12/22/2010 at 10:37:42  Patient: Melanie [name]  Note: All result statuses are Final unless otherwise noted.    Tests: (1) Vitamin D, 25-Hydroxy (604540)    Order Note: Performed at:  [facility] - [facility]    Order Note: [address] [place], [place]  [id number]    Order Note: Lab Director: Jacki Cones MD, Phone:  [phone]  ! Vitamin D, 25-Hydroxy                         [L]  6.3 ng/mL                   30.0-100.0      Vitamin D deficiency has been defined by the Institute of      Medicine and an Endocrine Society practice guideline as a      level of serum 25-[place] vitamin D less than 20 ng/mL (1,2).      The Endocrine Society went on to further define vitamin D      insufficiency as a level between 21 and 29 ng/mL (2).      1. IOM (Institute of Medicine). 2011. Dietary reference         intakes for calcium and D. [place]: The         [employer].      2. Holick MF, Binkley NC, Bischoff-Ferrari HA, et al.         Evaluation, treatment, and prevention of vitamin D         deficiency: an Endocrine Society clinical practice         guideline. JCEM. 2011 Jul; 96(7):1911-30.  Patient Note: PATIENT UNABLE TO VOID PAT IENT UNABLE TO VOID PATIEN T UNABLE T    Note: An exclamation mark (!) indicates a result that was not dispersed into   the flowsheet.  Document Creation Date: 12/17/2010 5:28 AM  _______________________________________________________________________    (1) Order result status: Final  Collection or observation date-time: 12/16/2010 10:21  Requested date-time: 12/16/2010 10:21  Receipt date-time: 12/16/2010 10:21  Reported date-time:   Referring Physician:    Ordering Physician:  Karmen Bongo)  Specimen Source:   Source: Leverne Humbles Order Number: [id number]  Lab site:

## 2010-12-16 NOTE — Unmapped (Signed)
Signed by Buel Ream CNP on 12/16/2010 at 00:00:00  FMLA      Imported By: Coletta Memos 01/05/2011 11:09:13    _____________________________________________________________________    External Attachment:    Please see [employer] EMR for this document.

## 2010-12-16 NOTE — Unmapped (Signed)
Signed by Buel Ream CNP on 12/22/2010 at 10:33:17  Patient: Melanie [name]  Note: All result statuses are Final unless otherwise noted.    Tests: (1) CBC With Differential/Platelet ([id number])    Order Note: Performed at:  [facility] - [facility]    Order Note: [address] [place], [place]  [id number]    Order Note: Lab Director: Jacki Cones MD, Phone:  [phone]    WBC                       10.3 x10E3/uL               4.0-10.5    RBC                       4.59 x10E6/uL               3.80-5.10    Hemoglobin                14.4 g/dL                   28.4-13.2    Hematocrit                43.1 %                      34.0-44.0    MCV                       94 fL                       80-98    MCH                       31.4 pg                     27.0-34.0    MCHC                      33.4 g/dL                   44.0-10.2    RDW                       13.0 %                      11.7-15.0    Platelets                 298 x10E3/uL                140-415    Neutrophils               66 %                        40-74  ! Immature Granulocytes                              0 %                         0-2    Lymphs  25 %                        14-46    Monocytes                 8 %                         4-13    Eos                       1 %                         0-7    Basos                     0 %                         0-3   Neutrophils (Absolute)                              6.8 x10E3/uL                1.8-7.8  ! Immature Grans (Abs)      0.0 x10E3/uL                0.0-0.1    Lymphs (Absolute)         2.6 x10E3/uL                0.7-4.5    Monocytes(Absolute)       0.8 x10E3/uL                0.1-1.0    Eos (Absolute)            0.1 x10E3/uL                0.0-0.4    Baso (Absolute)           0.0 x10E3/uL                0.0-0.2  Patient Note: PATIENT UNABLE TO VOID PAT IENT UNABLE TO VOID PATIEN T UNABLE T    Note: An exclamation mark (!) indicates a result that was not dispersed into   the  flowsheet.  Document Creation Date: 12/17/2010 5:28 AM  _______________________________________________________________________    (1) Order result status: Final  Collection or observation date-time: 12/16/2010 10:21  Requested date-time: 12/16/2010 10:21  Receipt date-time: 12/16/2010 10:21  Reported date-time:   Referring Physician:    Ordering Physician:  Karmen Bongo)  Specimen Source:   Source: Leverne Humbles Order Number: [id number]  Lab site:       -----------------    The following lab values were dispersed to the flowsheet  with no units conversion:      WBC, 10.3 X10E3/UL, (F)  expected units: 10*3/mm3    RBC, 4.59 X10E6/UL, (F)  expected units: 10*6/mm3    Platelets, 298 X10E3/UL, (F)  expected units: 10*3/mm3    Neutrophils (Absolute), 6.8 X10E3/UL, (F)  expected units: K/uL    Lymphs (Absolute), 2.6 X10E3/UL, (F)  expected units: K/uL    Eos (Absolute), 0.1 X10E3/UL, (F)  expected units: 10*3/mm3    Baso (Absolute), 0.0 X10E3/UL, (F)  expected units: K/uL

## 2010-12-16 NOTE — Unmapped (Signed)
Signed by Salomon Fick MA on 12/16/2010 at 10:09:51                        [facility]         Neurology         [address]         [place], [place] [address]         p [phone] f [phone]         [email or web address]    December 16, 2010        RE: Melanie [name]  DOB:   Aug 15, 1956        To whom it may concern:      On behalf of this patient, I am requesting a handicap placard due to the diagnosis of Multiple Sclerosis .  This placard should be processed with an expiration date of: December 21,2016 (5 years) .  Please accept this letter instead of prescription.       If you have any questions, please contact [facility] at [phone]        Sincerely,      Hulan Fess, MD

## 2010-12-22 LAB — URINALYSIS W/RFL TO MICROSCOPIC
Bilirubin Urine: NEGATIVE
Blood, UA: NEGATIVE
Glucose, UA: NEGATIVE g/dL
Hyaline Casts, UA: NONE SEEN /lpf
Ketones, UA: NEGATIVE
Nitrite, UA: NEGATIVE
POC Bacteria, UA: NONE SEEN
RBC, UA: NONE SEEN (ref <=3)
Specific Gravity, UA: 1.029 (ref 1.001–1.035)
pH, UA: 6 (ref 5.0–8.0)

## 2010-12-22 NOTE — Unmapped (Signed)
Signed by Buel Ream CNP on 12/22/2010 at 13:40:57    PHONE NOTE  Call back at Home Phone: [phone]  Caller's Cell Phone #: [phone]  Caller: patient  Department: Neurology  Call for: [facility]    Reason for Call: pt returned call MA's call      Initial call taken by: [facility] Rippy,  December 22, 2010 10:55 AM      FOLLOW UP  I had called her back & lmov she returned my call.  She said that she just needs something stating 14 days FMLA for now since she is off till Jan 3rd- she said that she is only getting paid for the 5-7 days listed on FMLA form.  When she came into the office last week with FMLA forms she tried to tell Selena Batten & myself that she had fibromyalgia and it should be on the [place]ML[place] form with [place]. We searched the chart and couldn't find anything that stated she has fibromyalgia- unless we have overlooked it.    Follow-up by:  Salomon Fick MA,  December 22, 2010 11:03 AM    FOLLOW UP  left VM with Marylu Lund HR - requesting 14 days of time off due to medication adjustments and referring pt to Neuro - opth.  Follow-up by:  Buel Ream CNP,  December 22, 2010 1:40 PM

## 2010-12-22 NOTE — Unmapped (Signed)
Signed by Buel Ream CNP on 12/29/2010 at 09:01:50  Patient: Melanie [name]  Note: All result statuses are Final unless otherwise noted.    Tests: (1)   (QDL-%SBUAMSG)  ! NOTE                      .             This urine was analyzed for the presence of WBC,       RBC, bacteria, casts, and other formed elements.       Only those elements seen were reported.            Note: An exclamation mark (!) indicates a result that was not dispersed into   the flowsheet.  Document Creation Date: 12/25/2010 3:09 AM  _______________________________________________________________________    (1) Order result status: Final  Collection or observation date-time: 12/22/2010 15:27  Requested date-time:   Receipt date-time: 12/23/2010 01:06  Reported date-time: 12/25/2010 03:00  Referring Physician:    Ordering Physician: [id number] Letitia Libra (UNSPECPR)  Specimen Source: *  Source: Arline Asp Order Number: [id number]  Lab site: [facility] [place]      [address]      [place]  [place]  [address]

## 2010-12-22 NOTE — Unmapped (Signed)
Signed by Salomon Fick MA on 12/22/2010 at 10:48:20    PHONE NOTE  Call back at Home Phone: [phone]  Caller's Cell Phone #: [phone]  Caller: patient  Department: Neurology  Call for: Melanie [name]    Reason for Call: pt needs to speak with MA - no reason given when asked      Initial call taken by: Juliette Alcide Rippy,  December 22, 2010 9:17 AM      FOLLOW UP  1st # vm is full / 2nd number she didn't answer and lmov for a cb not even been 10 min since she called....   Follow-up by:  Salomon Fick MA,  December 22, 2010 9:22 AM    FOLLOW UP  Patient did call back & it was about her FMLA again- see prior notes from last week.  When she just popped in last week with her FMLA I just printed off the prior FMLA scanned 01/21/09.  Patient said that we need to call her HR Margalit [phone] and change her days off to 14 days instead of what it written 5-7 days per every 1 month period.  She said that she thinks she is having a flare-up has the urinalysis lab order & urine C&S but has not had it done yet only did CBC- will do it today.  She said that her vision is effected couldn't see for drivers test failed the first time when she went back a different person just passed her even though she can't see.  Said that she would like to discuss with Emaly Boschert.   Follow-up by:  Salomon Fick MA,  December 22, 2010 9:48 AM    FOLLOW UP  please order UA< C+S  Pt also has low Vit D level and should take 2000 IU  of Vit D over the counter per day.   Discussed FMLA with Dr Grayland Jack - cannot provide 14 days per month of FMLA    Follow-up by:  Buel Ream CNP,  December 22, 2010 10:37 AM    FOLLOW UP  Patient was called lmov of what she needs to do & regarding FMLA  Follow-up by:  Salomon Fick MA,  December 22, 2010 10:47 AM

## 2010-12-22 NOTE — Unmapped (Signed)
Signed by Buel Ream CNP on 12/29/2010 at 09:01:50  Patient: Melanie [name]  Note: All result statuses are Final unless otherwise noted.    Tests: (1) URINALYSIS REFLEX (QDL-7909)    COLOR                     YELLOW                      YELLOW      The preferred specimen for urinalysis is urine      preserved using a [employer] Urine Preservative      Tube (yellow top, blue band) that may be obtained      from your [employer] supplier.      Please review results with caution. Urinalysis      testing on unpreserved urine may produce alteration      of chemical constituents and deterioration of formed      elements.     APPEARANCE           [A]  TURBID                      CLEAR    SPECIFIC GRAVITY          1.029                       1.001-1.035    PH                        6.0                         5.0-8.0    GLUCOSE                   NEGATIVE                    NEGATIVE    BILIRUBIN                 NEGATIVE                    NEGATIVE    KETONES                   NEGATIVE                    NEGATIVE    OCCULT BLOOD              NEGATIVE                    NEGATIVE    PROTEIN              [A]  TRACE                       NEGATIVE    NITRITE                   NEGATIVE                    NEGATIVE    LEUKOCYTE ESTERASE   [A]  3+                          NEGATIVE    WBC                  [  A]  20-40 /HPF                  < OR = 5    RBC                       NONE SEEN /HPF              < OR = 3   SQUAMOUS EPITHELIAL CELLS                         [A]  6-10 /HPF                   < OR = 5    BACTERIA                  NONE SEEN /HPF              NONE SEEN    HYALINE CAST              NONE SEEN /LPF              NONE SEEN    Note: An exclamation mark (!) indicates a result that was not dispersed into   the flowsheet.  Document Creation Date: 12/25/2010 3:09 AM  _______________________________________________________________________    (1) Order result status: Final  Collection or observation date-time:  12/22/2010 15:27  Requested date-time:   Receipt date-time: 12/23/2010 01:06  Reported date-time: 12/25/2010 03:00  Referring Physician:    Ordering Physician: [id number] Letitia Libra (UNSPECPR)  Specimen Source: R  Source: Arline Asp Order Number: [id number]  Lab site: [facility] [place]      [address]      [place]  [facility]  [address]      -----------------    The following non-numeric lab results were dispersed to  the flowsheet even though numeric results were expected:      GLUCOSE, NEGATIVE    WBC, 20-40

## 2010-12-22 NOTE — Unmapped (Signed)
Signed by Buel Ream CNP on 12/29/2010 at 09:01:50  Patient: Melanie [name]  Note: All result statuses are Final unless otherwise noted.    Tests: (1) CULTURE, URINE, ROUTINE (QDL-395)  ! CULTURE, URINE, ROUTINE                              .              CULTURE, URINE, ROUTINE                 MICRO NUMBER:      [id number]        TEST STATUS:       FINAL        SPECIMEN SOURCE:   URINE        SPECIMEN QUALITY:  ADEQUATE        RESULT:            Three or more organisms present, each greater                           than 10,000 cu/mL. May represent normal flora                           contamination from external genitalia. No further                           testing is required.    Note: An exclamation mark (!) indicates a result that was not dispersed into   the flowsheet.  Document Creation Date: 12/25/2010 3:09 AM  _______________________________________________________________________    (1) Order result status: Final  Collection or observation date-time: 12/22/2010 15:27  Requested date-time:   Receipt date-time: 12/23/2010 01:06  Reported date-time: 12/25/2010 03:00  Referring Physician:    Ordering Physician: [id number] Letitia Libra (UNSPECPR)  Specimen Source: R  Source: Arline Asp Order Number: [id number]  Lab site: [facility] [place]      [address]      [place]  [place]  [address]

## 2010-12-22 NOTE — Unmapped (Signed)
Signed by Buel Ream CNP on 12/22/2010 at 15:35:29      Follow-up for Phone Call   spoke with HR - patient has been off for 2 weeks the last 2 weeks of December - for 3 years -   Follow-up by: Buel Ream CNP,  December 22, 2010 3:34 PM

## 2010-12-22 NOTE — Unmapped (Addendum)
Signed by Buel Ream CNP on 12/22/2010 at 15:33:59                        [facility]         Neurology         [address]         [place], [place] [address]         p [phone] f [phone]         [email or web address]    December 22, 2010        RE: Melanie [name]  DOB:   [birth year]               To Whom it May Concern:    Please excuse patient from work 12/10/10 to 12/29/10.  She has dx of Multiple Sclerosis and is having a flair up with pain, and weakness.  She will be getting PT and new medication that can cause drowsiness.    Thank you,  Letitia Libra NP        Signed by Salomon Fick MA on 12/22/2010 at 15:42:23    faxed to [phone]

## 2010-12-23 LAB — RENAL FUNCTION PANEL W/EGFR
Albumin: 4.3 g/dL (ref 3.6–5.1)
BUN: 10 mg/dL (ref 7–25)
CO2: 29 mmol/L (ref 21–33)
Calcium: 9.6 mg/dL (ref 8.6–10.2)
Chloride: 99 mmol/L (ref 98–110)
Creatinine: 0.72 mg/dL (ref 0.60–1.10)
GFR MDRD Af Amer: 110 mL/min (ref >=60)
GFR MDRD Non Af Amer: 95 mL/min (ref >=60)
Glucose: 112 mg/dL (ref 65–99)
Phosphorus: 3.5 mg/dL (ref 2.5–4.5)
Potassium: 3.9 mmol/L (ref 3.5–5.3)
Sodium: 137 mmol/L (ref 135–146)

## 2010-12-23 NOTE — Unmapped (Signed)
Signed by Salomon Fick MA on 12/23/2010 at 16:20:13    PHONE NOTE  Caller's Cell Phone #: [phone] or [phone]  Caller: patient  Department: Neurology  Call for: [facility]    Reason for Call: pt needs to speak with MA - no reason given when asked      Initial call taken by: Juliette Alcide Rippy,  December 23, 2010 1:45 PM      New Medications:  ATIVAN 0.5 MG TABS (LORAZEPAM) Patient can take 2 before MRI    New Orders:  Renal Function Panel   (KIDNEY) (19147) [CPT-80069]  FOLLOW UP  Mamie Nick from MRI facility sent message. Hi Becky...Melanie KitchenMarland KitchenPt just scheduled here at [facility] for next Tuesday, 12/29/10.  She has hypertension and will need a Renal Function Panel.  She also requested that you contact her with information on sedation for the test(she is very claustrophobic).  The best number to reach her is [phone]) [phone].  I told her you would be calling her.  Thanks!  Follow-up by:  Salomon Fick MA,  December 23, 2010 2:56 PM    FOLLOW UP  spoke to the PT she will pick-up the order for the renal panel tomorrow & we will give her ativan .5 mg 2 tabs before the MRI.   Follow-up by:  Salomon Fick MA,  December 23, 2010 4:17 PM    Prescriptions:  ATIVAN 0.5 MG TABS (LORAZEPAM) Patient can take 2 before MRI  #2 x 0   Entered by: Salomon Fick MA   Authorized by: Buel Ream CNP   Signed by: Salomon Fick MA on 12/23/2010   Method used: Print then Give to Patient   RxID: [id number]      Process Orders  Check Orders Results:      EMR Link Lab: ABN not required for this insurance  Tests Sent for requisitioning (December 23, 2010 4:09 PM):      12/23/2010: EMR Link Lab -- Renal Function Panel   (KIDNEY) (10314) [CPT-80069] (signed)

## 2010-12-23 NOTE — Unmapped (Signed)
Signed by Buel Ream CNP on 12/29/2010 at 09:01:50  Patient: Melanie [name]  Note: All result statuses are Final unless otherwise noted.    Tests: (1) RENAL FUNCTION PANEL (QDL-10314)    GLUCOSE              [H]  112 mg/dL                   21-30                        Fasting reference interval           UREA NITROGEN (BUN)       10 mg/dL                    8-65    CREATININE                0.72 mg/dL                  0.60-1.10   eGFR NON-AFR. AMERICAN                              95 mL/min/1.62m2            > OR = 60   eGFR AFRICAN AMERICAN                              110 mL/min/1.50m2           > OR = 60   BUN/CREATININE RATIO (calc)                              NOT APPLICABLE              6-22    SODIUM                    137 mmol/L                  135-146    POTASSIUM                 3.9 mmol/L                  3.5-5.3    CHLORIDE                  99 mmol/L                   98-110    CARBON DIOXIDE            29 mmol/L                   21-33    CALCIUM                   9.6 mg/dL                   7.8-46.9   PHOSPHATE (AS PHOSPHORUS)                              3.5 mg/dL                   6.2-9.5    ALBUMIN  4.3 g/dL                    7.8-2.9    Note: An exclamation mark (!) indicates a result that was not dispersed into   the flowsheet.  Document Creation Date: 12/26/2010 2:35 AM  _______________________________________________________________________    (1) Order result status: Final  Collection or observation date-time: 12/23/2010  Requested date-time:   Receipt date-time: 12/25/2010 22:21  Reported date-time: 12/26/2010 02:00  Referring Physician:    Ordering Physician: Letitia Libra [facility])  Specimen Source: S  Source: Arline Asp Order Number: [id number]  Lab site: [facility] [place]      [address]      [place]  [place]  [address]      -----------------    The following non-numeric lab results were dispersed to  the flowsheet even though numeric  results were expected:      BUN/CREATININE RATIO (calc), NOT APPLICABLE

## 2010-12-25 NOTE — Unmapped (Signed)
Signed by Salomon Fick MA on 12/25/2010 at 09:43:08    PHONE NOTE  Caller's Cell Phone #: [phone]  Caller: patient  Department: Neurology  Call for: [facility]    Reason for Call: wants to have tests done at [facility] (cannot provide #, says she will call back w/#)      Initial call taken by: Young Berry,  December 25, 2010 9:19 AM      FOLLOW UP  faxed through EMR using [phone]  Follow-up by:  Salomon Fick MA,  December 25, 2010 9:41 AM    FOLLOW UP  patient advised  Follow-up by:  Salomon Fick MA,  December 25, 2010 9:43 AM

## 2010-12-25 NOTE — Unmapped (Signed)
Signed by Buel Ream CNP on 12/29/2010 at 12:32:46    PHONE NOTE  Caller's Cell Phone #: [phone]  Caller: patient  Department: Neurology  Call for: [facility]    Reason for Call: pt needs order faxed to lab at [phone] & she wants to speak with MA about this also      Initial call taken by: Juliette Alcide Rippy,  December 25, 2010 10:00 AM      FOLLOW UP  told her that I had faxed renal panel to [facility]. [place] . Salmaan Patchin she is asking about her C&S & Urinalysis.  Results are in the EMR under 12/27 .   Physician paged  Follow-up by:  Salomon Fick MA,  December 25, 2010 10:17 AM    FOLLOW UP  negative C+S - patient aware  Having MRI today  patient advised  Follow-up by:  Buel Ream CNP,  December 29, 2010 12:32 PM

## 2010-12-29 ENCOUNTER — Inpatient Hospital Stay

## 2010-12-29 NOTE — Unmapped (Signed)
Signed by Buel Ream CNP on 12/29/2010 at 12:33:16    PHONE NOTE  Caller's Cell Phone #: [phone]  Caller: patient  Department: Neurology  Call for: [facility]    Reason for Call: pt has MRI today at 12:30  and is coming NOW for a script to calm her down - pt states [name] said that something can be called into pharmacy - pt even stated that med could be called into [place] [facility] downstairs at [facility] - call pt back. thanks      Initial call taken by: Juliette Alcide Rippy,  December 29, 2010 11:49 AM      FOLLOW UP  Pt to have Ativan for MRI  Follow-up by:  Buel Ream CNP,  December 29, 2010 12:33 PM

## 2010-12-29 NOTE — Unmapped (Signed)
Signed by   LinkLogic on 12/29/2010 at 17:20:03  Patient: Melanie [name]  Note: All result statuses are Final unless otherwise noted.    Tests: (1) MRI-HEAD W/WO CONTRAST [id number])    Order Note: RadNet Accession Number: [id number]    Order Note:                          [facility]     Patient: Melanie [name], Melanie [name]   DOB:     Mar 31, 1956   MRN:     [id number]   FIN:       [id number]   Accn#:   [id number]                               Magnetic Resonance Imaging     Exam                       Exam Date/Time       Ordering Physician   MRI-HEAD W/WO CONTRAST     12/29/2010 14:32 EST Marelin Tat CNP, Tracye Szuch S   MRI-C-SPINE W/WO CONTRAST  12/29/2010 14:32 EST Evonna Stoltz CNP, Jeneal Vogl S     Reason for Exam   (MRI-HEAD W/WO CONTRAST)  MULTIPLE SCLEROSIS   (MRI-C-SPINE W/WO CONTRAST)  MULTIPLE SCLEROSIS     Report   MRI brain with and without contrast dated December 29, 2010     Indication: Multiple sclerosis     Comparison: Multiple priors, most recently from of June 2009     Technical Factors: Standard multiplanar multisequence MRI of the brain was   performed before and after intravenous infusion of 20mL of gadolinium   contrast.     Findings:   No diffusion restriction is demonstrated. Midline structures are normal. A   single focus of decreased T1 signal is again noted in the right centrum   semiovale. Diffuse poorly defined areas of increased T2 FLAIR signal   involving the pons, periventricular white matter and bilateral centrum   semiovale/corona radiata are again noted, similar in appearance to prior   study. There is no evidence of mass or hemorrhage. The intracranial   arterial and venous flow-voids are normal. The orbits are visualized   without abnormality. No abnormal areas of enhancement are identified.     IMPRESSION:   1. No significant interval change in chronic findings consistent with   multiple sclerosis.             MRI of the cervical spine with and without contrast dated December 29, 2010     Indication: Multiple sclerosis     Comparison: Multiple priors, most recently from June 2009     Technique: Multiplanar multisequence imaging of the cervical spine was   performed. The imaging includes sagittal and axial T1 and T2 weighted   sequences. Additionally, gadolinium was administered for evaluation of all   of the imaged spinal segments.     Additionally, a fat-suppressed T1-   weighted postcontrast sequence was included.  Additionally, a fat-   suppressed T2-weighted sequence was included.     FINDINGS:   The visualized posterior fossa and skull base structures are within normal   limits.  There is normal alignment at the craniocervical junction.     The vertebra demonstrate normal bone marrow signal and alignment.   Degenerative bone marrow endplate signal is present at multiple levels.  The cervical spinal cord demonstrates normal signal and morphology There is   no abnormal fluid collection or mass lesion.  Mild multilevel degenerative   changes are again noted without significant mass effect on the central   canal and neuroforamen. No abnormal enhancement demonstrated on the   postcontrast series.     IMPRESSION:   1.No evidence of demyelinating disease in the cervical spine.     Approved by IAN Criss Rosales), M.D. on 12/29/2010 4:10 PM   ********** VERIFIED REPORT **********     Dictated: 12/29/2010 3:14 pm      Chandra Batch MD, IAN T   Signed (Electronic Signature):  Burt Ek                 12/29/10   5:19   Resident:  Chandra Batch MD, IAN T     Technologist: MRJ    Order Note:   EMR Routing to: DADDABBO, Marlana Salvage - copy to - [id number]  EMR Routing to: Buel Ream - ordering - [id number]    Note: An exclamation mark (!) indicates a result that was not dispersed into   the flowsheet.  Document Creation Date: 12/29/2010 5:20 PM  _______________________________________________________________________    (1) Order result status: Final  Collection or observation date-time: 12/29/2010 14:32:36  Requested  date-time: 12/29/2010 13:00:00  Receipt date-time:   Reported date-time: 12/29/2010 17:19:38  Referring Physician: Letitia Libra  Ordering Physician: Letitia Libra [facility])  Specimen Source: RAD&Rad Type  Source: QRS  Filler Order Number: [id number]  Lab site: [facility]

## 2010-12-29 NOTE — Unmapped (Signed)
Signed by Generic  UCP Provider on 12/29/2010 at 00:00:00  Medical Records Release      Imported By: Coletta Memos 01/05/2011 11:43:49    _____________________________________________________________________    External Attachment:    Please see Ya[facility] EMR for this document.

## 2010-12-29 NOTE — Unmapped (Signed)
Signed by   LinkLogic on 12/29/2010 at 17:20:02  Patient: Melanie [name]  Note: All result statuses are Final unless otherwise noted.    Tests: (1) MRI-C-SPINE W/WO CONTRAST [phone])    Order Note: RadNet Accession Number: [id number]    Order Note:                          [facility]     Patient: Melanie [name]   DOB:     02-11-1956   MRN:     [id number]   FIN:       [id number]   Accn#:   [id number]                               Magnetic Resonance Imaging     Exam                       Exam Date/Time       Ordering Physician   MRI-HEAD W/WO CONTRAST     12/29/2010 14:32 EST Wretha Laris CNP, Sharene Krikorian S   MRI-C-SPINE W/WO CONTRAST  12/29/2010 14:32 EST Shifa Brisbon CNP, Zayvion Stailey S     Reason for Exam   (MRI-HEAD W/WO CONTRAST)  MULTIPLE SCLEROSIS   (MRI-C-SPINE W/WO CONTRAST)  MULTIPLE SCLEROSIS     Report   MRI brain with and without contrast dated December 29, 2010     Indication: Multiple sclerosis     Comparison: Multiple priors, most recently from of June 2009     Technical Factors: Standard multiplanar multisequence MRI of the brain was   performed before and after intravenous infusion of 20mL of gadolinium   contrast.     Findings:   No diffusion restriction is demonstrated. Midline structures are normal. A   single focus of decreased T1 signal is again noted in the right centrum   semiovale. Diffuse poorly defined areas of increased T2 FLAIR signal   involving the pons, periventricular white matter and bilateral centrum   semiovale/corona radiata are again noted, similar in appearance to prior   study. There is no evidence of mass or hemorrhage. The intracranial   arterial and venous flow-voids are normal. The orbits are visualized   without abnormality. No abnormal areas of enhancement are identified.     IMPRESSION:   1. No significant interval change in chronic findings consistent with   multiple sclerosis.             MRI of the cervical spine with and without contrast dated December 29, 2010     Indication: Multiple sclerosis     Comparison: Multiple priors, most recently from June 2009     Technique: Multiplanar multisequence imaging of the cervical spine was   performed. The imaging includes sagittal and axial T1 and T2 weighted   sequences. Additionally, gadolinium was administered for evaluation of all   of the imaged spinal segments.     Additionally, a fat-suppressed T1-   weighted postcontrast sequence was included.  Additionally, a fat-   suppressed T2-weighted sequence was included.     FINDINGS:   The visualized posterior fossa and skull base structures are within normal   limits.  There is normal alignment at the craniocervical junction.     The vertebra demonstrate normal bone marrow signal and alignment.   Degenerative bone marrow endplate signal is present at multiple levels.  The cervical spinal cord demonstrates normal signal and morphology There is   no abnormal fluid collection or mass lesion.  Mild multilevel degenerative   changes are again noted without significant mass effect on the central   canal and neuroforamen. No abnormal enhancement demonstrated on the   postcontrast series.     IMPRESSION:   1.No evidence of demyelinating disease in the cervical spine.     Approved by IAN Criss Rosales), M.D. on 12/29/2010 4:10 PM   ********** VERIFIED REPORT **********     Dictated: 12/29/2010 3:14 pm      Chandra Batch MD, IAN T   Signed (Electronic Signature):  Burt Ek                 12/29/10   5:19   Resident:  Chandra Batch MD, IAN T     Technologist: MRJ    Order Note:   EMR Routing to: DADDABBO, Marlana Salvage - copy to - [id number]  EMR Routing to: Buel Ream - ordering - [id number]    Note: An exclamation mark (!) indicates a result that was not dispersed into   the flowsheet.  Document Creation Date: 12/29/2010 5:20 PM  _______________________________________________________________________    (1) Order result status: Final  Collection or observation date-time: 12/29/2010 14:32:36  Requested  date-time: 12/29/2010 13:00:00  Receipt date-time:   Reported date-time: 12/29/2010 17:19:38  Referring Physician: Letitia Libra  Ordering Physician: Letitia Libra [facility])  Specimen Source: RAD&Rad Type  Source: QRS  Filler Order Number: [id number]  Lab site: [facility]

## 2010-12-30 NOTE — Unmapped (Signed)
Signed by Salomon Fick MA on 12/30/2010 at 11:52:44    PHONE NOTE  Caller's Cell Phone #: [phone]  Caller: patient  Department: Neurology  Call for: [facility]    Reason for Call: refill: rebif, uses [facility] [phone], OUT OF MEDS      Initial call taken by: Juliette Alcide Rippy,  December 30, 2010 11:46 AM      Prescriptions:  REBIF 44 MCG/0.5ML SOLN (INTERFERON BETA-1A) 1 injection TIW  #36 x 3   Entered by: Salomon Fick MA   Authorized by: Buel Ream CNP   Signed by: Salomon Fick MA on 12/30/2010   Method used: Telephoned to ...     [facility] (retail)     [address], [place]  [address]     Ph: [phone]     Fax: [phone]   RxID: [id number]

## 2010-12-31 ENCOUNTER — Inpatient Hospital Stay

## 2010-12-31 NOTE — Unmapped (Signed)
Signed by Buel Ream CNP on 12/31/2010 at 00:00:00  [facility]      Imported By: Maryellen Pile 03/30/2011 10:14:50    _____________________________________________________________________    External Attachment:    Please see [employer] EMR for this document.

## 2010-12-31 NOTE — Unmapped (Signed)
Signed by   LinkLogic on 12/31/2010 at 17:04:58  Patient: Melanie [name]  Note: All result statuses are Final unless otherwise noted.    Tests: (1) DIAG-HAND 2-VIEWS LT [phone])    Order Note: RadNet Accession Number: [id number]    Order Note:                                 Dinah Beers     Patient: Melanie [name], Melanie [name]   DOB:     07-Apr-1956   MRN:     [id number]   FIN:       [id number]   Accn#:   [id number]                                  Diagnostic Radiology     Exam                       Exam Date/Time       Ordering Physician   DIAG-HAND 2-VIEWS LT       12/31/2010 15:41 EST Theodoro Clock   DIAG-HAND 2-VIEWS RT       12/31/2010 15:41 EST Theodoro Clock   DIAG-KNEE 1 OR 2-VIEWS LT  12/31/2010 15:41 EST Theodoro Clock   DIAG-KNEE 1 OR 2-VIEWS RT  12/31/2010 15:41 EST Theodoro Clock     Reason for Exam   (DIAG-HAND 2-VIEWS LT)  arthritis   (DIAG-HAND 2-VIEWS RT)  arthritis   (DIAG-KNEE 1 OR 2-VIEWS LT)  arthritis   (DIAG-KNEE 1 OR 2-VIEWS RT)  arthritis     Report   2 views of the bilateral hands, 2 views of the bilateral knees dated   December 31, 2010     Indication: Arthritis     Comparison: None     Bilateral hands:     Findings: Frontal and lateral views of both hands were obtained. There is   no acute fracture or dislocation. In the left hand, there is mild spurring   and osteophytosis of the fifth distal interphalangeal joint with adjacent   soft tissue swelling. The other joint spaces are well preserved. A small   ossicle is seen adjacent to the third metacarpal phalangeal joint. In the   right hand, there is osteophytosis and spurring in the first   metacarpophalangeal joint and the second distal interphalangeal joint. The   joint spaces are preserved. There is no evidence of bony erosions in either   hand to suggest an inflammatory arthropathy.     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     Right hand:   Mild osteoarthrosis of the first  metacarpophalangeal and second distal   interphalangeal joints.     __________________________________________________________     Bilateral knees:     Findings: Frontal and lateral views of the bilateral knees were obtained.   There is narrowing of the medial compartments bilaterally. On the left   knee, there is narrowing of the patellofemoral compartment with spurring. A   small effusion is present in the left knee. There is no effusion present in   the right knee. The soft tissues are unremarkable. There is no acute   fracture or dislocation.     IMPRESSION:     Left knee:   Mild medial and patellofemoral compartment osteoporosis. Small  suprapatellar joint effusion.     Right knee:   Mild medial compartment osteoarthrosis.     Approved by Georgeanna Harrison, M.D. on 12/31/2010 4:45 PM   ********** VERIFIED REPORT **********     Dictated: 12/31/2010 4:39 pm      YOUNG MD, Leonette Most   Signed (Electronic Signature):  JOHNSTON MD, Gregary Signs K             12/31/10   5:04   Resident:  Maple Hudson MD, CHARLES     Technologist: EGW    Order Note:   EMR Routing to: DADDABBO, Marlana Salvage - copy to - [id number]  EMR Routing to: Theodoro Clock - ordering - [id number]    Note: An exclamation mark (!) indicates a result that was not dispersed into   the flowsheet.  Document Creation Date: 12/31/2010 5:04 PM  _______________________________________________________________________    (1) Order result status: Final  Collection or observation date-time: 12/31/2010 15:41:07  Requested date-time: 12/31/2010 15:22:00  Receipt date-time:   Reported date-time: 12/31/2010 17:04:22  Referring Physician: Theodoro Clock  Ordering Physician: Theodoro Clock [facility])  Specimen Source: RAD&Rad Type  Source: QRS  Filler Order Number: [id number]  Lab site: [facility]

## 2010-12-31 NOTE — Unmapped (Signed)
Signed by Simeon Craft MD on 12/31/2010 at 00:00:00  Rheumatology      Imported By: Lorelei Pont MA 02/06/2011 11:39:45    _____________________________________________________________________    External Attachment:    Please see [employer] EMR for this document.

## 2010-12-31 NOTE — Unmapped (Signed)
Signed by   LinkLogic on 12/31/2010 at 17:05:01  Patient: Melanie [name]  Note: All result statuses are Final unless otherwise noted.    Tests: (1) DIAG-KNEE 1 OR 2-VIEWS RT [phone])    Order Note: RadNet Accession Number: [id number]    Order Note:                                 Dinah Beers     Patient: Melanie [name], Melanie [name]   DOB:     12/22/1956   MRN:     [id number]   FIN:       [id number]   Accn#:   [id number]                                  Diagnostic Radiology     Exam                       Exam Date/Time       Ordering Physician   DIAG-HAND 2-VIEWS LT       12/31/2010 15:41 EST Theodoro Clock   DIAG-HAND 2-VIEWS RT       12/31/2010 15:41 EST Theodoro Clock   DIAG-KNEE 1 OR 2-VIEWS LT  12/31/2010 15:41 EST Theodoro Clock   DIAG-KNEE 1 OR 2-VIEWS RT  12/31/2010 15:41 EST Theodoro Clock     Reason for Exam   (DIAG-HAND 2-VIEWS LT)  arthritis   (DIAG-HAND 2-VIEWS RT)  arthritis   (DIAG-KNEE 1 OR 2-VIEWS LT)  arthritis   (DIAG-KNEE 1 OR 2-VIEWS RT)  arthritis     Report   2 views of the bilateral hands, 2 views of the bilateral knees dated   December 31, 2010     Indication: Arthritis     Comparison: None     Bilateral hands:     Findings: Frontal and lateral views of both hands were obtained. There is   no acute fracture or dislocation. In the left hand, there is mild spurring   and osteophytosis of the fifth distal interphalangeal joint with adjacent   soft tissue swelling. The other joint spaces are well preserved. A small   ossicle is seen adjacent to the third metacarpal phalangeal joint. In the   right hand, there is osteophytosis and spurring in the first   metacarpophalangeal joint and the second distal interphalangeal joint. The   joint spaces are preserved. There is no evidence of bony erosions in either   hand to suggest an inflammatory arthropathy.     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     Right hand:   Mild osteoarthrosis of the first  metacarpophalangeal and second distal   interphalangeal joints.     __________________________________________________________     Bilateral knees:     Findings: Frontal and lateral views of the bilateral knees were obtained.   There is narrowing of the medial compartments bilaterally. On the left   knee, there is narrowing of the patellofemoral compartment with spurring. A   small effusion is present in the left knee. There is no effusion present in   the right knee. The soft tissues are unremarkable. There is no acute   fracture or dislocation.     IMPRESSION:     Left knee:   Mild medial and patellofemoral compartment osteoporosis.  Small   suprapatellar joint effusion.     Right knee:   Mild medial compartment osteoarthrosis.     Approved by Georgeanna Harrison, M.D. on 12/31/2010 4:45 PM   ********** VERIFIED REPORT **********     Dictated: 12/31/2010 4:39 pm      YOUNG MD, Leonette Most   Signed (Electronic Signature):  JOHNSTON MD, Gregary Signs K             12/31/10   5:04   Resident:  Maple Hudson MD, CHARLES     Technologist: EGW    Order Note:   EMR Routing to: DADDABBO, Marlana Salvage - copy to - [id number]  EMR Routing to: Theodoro Clock - ordering - [id number]    Note: An exclamation mark (!) indicates a result that was not dispersed into   the flowsheet.  Document Creation Date: 12/31/2010 5:05 PM  _______________________________________________________________________    (1) Order result status: Final  Collection or observation date-time: 12/31/2010 15:41:07  Requested date-time: 12/31/2010 15:22:00  Receipt date-time:   Reported date-time: 12/31/2010 17:04:22  Referring Physician: Theodoro Clock  Ordering Physician: Theodoro Clock [facility])  Specimen Source: RAD&Rad Type  Source: QRS  Filler Order Number: [id number]  Lab site: [facility]

## 2010-12-31 NOTE — Unmapped (Signed)
Signed by   LinkLogic on 12/31/2010 at 17:05:00  Patient: Melanie [name]  Note: All result statuses are Final unless otherwise noted.    Tests: (1) DIAG-KNEE 1 OR 2-VIEWS LT [phone])    Order Note: RadNet Accession Number: [id number]    Order Note:                                 Dinah Beers     Patient: Melanie [name], Melanie [name]   DOB:     December 27, 1956   MRN:     [id number]   FIN:       [id number]   Accn#:   [id number]                                  Diagnostic Radiology     Exam                       Exam Date/Time       Ordering Physician   DIAG-HAND 2-VIEWS LT       12/31/2010 15:41 EST Theodoro Clock   DIAG-HAND 2-VIEWS RT       12/31/2010 15:41 EST Theodoro Clock   DIAG-KNEE 1 OR 2-VIEWS LT  12/31/2010 15:41 EST Theodoro Clock   DIAG-KNEE 1 OR 2-VIEWS RT  12/31/2010 15:41 EST Theodoro Clock     Reason for Exam   (DIAG-HAND 2-VIEWS LT)  arthritis   (DIAG-HAND 2-VIEWS RT)  arthritis   (DIAG-KNEE 1 OR 2-VIEWS LT)  arthritis   (DIAG-KNEE 1 OR 2-VIEWS RT)  arthritis     Report   2 views of the bilateral hands, 2 views of the bilateral knees dated   December 31, 2010     Indication: Arthritis     Comparison: None     Bilateral hands:     Findings: Frontal and lateral views of both hands were obtained. There is   no acute fracture or dislocation. In the left hand, there is mild spurring   and osteophytosis of the fifth distal interphalangeal joint with adjacent   soft tissue swelling. The other joint spaces are well preserved. A small   ossicle is seen adjacent to the third metacarpal phalangeal joint. In the   right hand, there is osteophytosis and spurring in the first   metacarpophalangeal joint and the second distal interphalangeal joint. The   joint spaces are preserved. There is no evidence of bony erosions in either   hand to suggest an inflammatory arthropathy.     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     Right hand:   Mild osteoarthrosis of the first  metacarpophalangeal and second distal   interphalangeal joints.     __________________________________________________________     Bilateral knees:     Findings: Frontal and lateral views of the bilateral knees were obtained.   There is narrowing of the medial compartments bilaterally. On the left   knee, there is narrowing of the patellofemoral compartment with spurring. A   small effusion is present in the left knee. There is no effusion present in   the right knee. The soft tissues are unremarkable. There is no acute   fracture or dislocation.     IMPRESSION:     Left knee:   Mild medial and patellofemoral compartment osteoporosis.  Small   suprapatellar joint effusion.     Right knee:   Mild medial compartment osteoarthrosis.     Approved by Georgeanna Harrison, M.D. on 12/31/2010 4:45 PM   ********** VERIFIED REPORT **********     Dictated: 12/31/2010 4:39 pm      YOUNG MD, Leonette Most   Signed (Electronic Signature):  JOHNSTON MD, Gregary Signs K             12/31/10   5:04   Resident:  Maple Hudson MD, CHARLES     Technologist: EGW    Order Note:   EMR Routing to: DADDABBO, Marlana Salvage - copy to - [id number]  EMR Routing to: Theodoro Clock - ordering - [id number]    Note: An exclamation mark (!) indicates a result that was not dispersed into   the flowsheet.  Document Creation Date: 12/31/2010 5:05 PM  _______________________________________________________________________    (1) Order result status: Final  Collection or observation date-time: 12/31/2010 15:41:07  Requested date-time: 12/31/2010 15:22:00  Receipt date-time:   Reported date-time: 12/31/2010 17:04:22  Referring Physician: Theodoro Clock  Ordering Physician: Theodoro Clock [facility])  Specimen Source: RAD&Rad Type  Source: QRS  Filler Order Number: [id number]  Lab site: [facility]

## 2010-12-31 NOTE — Unmapped (Signed)
Signed by Salomon Fick MA on 12/31/2010 at 13:52:05    PHONE NOTE  Caller's Cell Phone #: [phone]  Caller: patient  Department: Neurology  Call for: Laural Benes    Reason for Call: rebif needs auth and she hasn't had a shot in a week - pt states the ph # for rebif of [facility] is on a booklet at the office      Initial call taken by: Juliette Alcide Rippy,  December 31, 2010 1:01 PM      FOLLOW UP  noted   Follow-up by:  Salomon Fick MA,  December 31, 2010 1:52 PM

## 2010-12-31 NOTE — Unmapped (Signed)
Signed by   LinkLogic on 12/31/2010 at 17:04:59  Patient: Melanie [name]  Note: All result statuses are Final unless otherwise noted.    Tests: (1) DIAG-HAND 2-VIEWS RT [phone])    Order Note: [facility]Net Accession Number: [id number]    Order Note:                                 Dinah Beers     Patient: Melanie [name], Melanie [name]   DOB:     April 16, 1956   MRN:     [id number]   FIN:       [id number]   Accn#:   [id number]                                  Diagnostic Radiology     Exam                       Exam Date/Time       Ordering Physician   DIAG-HAND 2-VIEWS LT       12/31/2010 15:41 EST Theodoro Clock   DIAG-HAND 2-VIEWS RT       12/31/2010 15:41 EST Theodoro Clock   DIAG-KNEE 1 OR 2-VIEWS LT  12/31/2010 15:41 EST Theodoro Clock   DIAG-KNEE 1 OR 2-VIEWS RT  12/31/2010 15:41 EST Theodoro Clock     Reason for Exam   (DIAG-HAND 2-VIEWS LT)  arthritis   (DIAG-HAND 2-VIEWS RT)  arthritis   (DIAG-KNEE 1 OR 2-VIEWS LT)  arthritis   (DIAG-KNEE 1 OR 2-VIEWS RT)  arthritis     Report   2 views of the bilateral hands, 2 views of the bilateral knees dated   December 31, 2010     Indication: Arthritis     Comparison: None     Bilateral hands:     Findings: Frontal and lateral views of both hands were obtained. There is   no acute fracture or dislocation. In the left hand, there is mild spurring   and osteophytosis of the fifth distal interphalangeal joint with adjacent   soft tissue swelling. The other joint spaces are well preserved. A small   ossicle is seen adjacent to the third metacarpal phalangeal joint. In the   right hand, there is osteophytosis and spurring in the first   metacarpophalangeal joint and the second distal interphalangeal joint. The   joint spaces are preserved. There is no evidence of bony erosions in either   hand to suggest an inflammatory arthropathy.     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     Right hand:   Mild osteoarthrosis of the first  metacarpophalangeal and second distal   interphalangeal joints.     __________________________________________________________     Bilateral knees:     Findings: Frontal and lateral views of the bilateral knees were obtained.   There is narrowing of the medial compartments bilaterally. On the left   knee, there is narrowing of the patellofemoral compartment with spurring. A   small effusion is present in the left knee. There is no effusion present in   the right knee. The soft tissues are unremarkable. There is no acute   fracture or dislocation.     IMPRESSION:     Left knee:   Mild medial and patellofemoral compartment osteoporosis. Small  suprapatellar joint effusion.     Right knee:   Mild medial compartment osteoarthrosis.     Approved by Georgeanna Harrison, M.D. on 12/31/2010 4:45 PM   ********** VERIFIED REPORT **********     Dictated: 12/31/2010 4:39 pm      YOUNG MD, Leonette Most   Signed (Electronic Signature):  JOHNSTON MD, Gregary Signs K             12/31/10   5:04   Resident:  Maple Hudson MD, CHARLES     Technologist: EGW    Order Note:   EMR Routing to: DADDABBO, Marlana Salvage - copy to - [id number]  EMR Routing to: Theodoro Clock - ordering - [id number]    Note: An exclamation mark (!) indicates a result that was not dispersed into   the flowsheet.  Document Creation Date: 12/31/2010 5:04 PM  _______________________________________________________________________    (1) Order result status: Final  Collection or observation date-time: 12/31/2010 15:41:07  Requested date-time: 12/31/2010 15:22:00  Receipt date-time:   Reported date-time: 12/31/2010 17:04:22  Referring Physician: Theodoro Clock  Ordering Physician: Theodoro Clock [facility])  Specimen Source: RAD&Rad Type  Source: QRS  Filler Order Number: [id number]  Lab site: [facility]

## 2011-01-01 NOTE — Unmapped (Signed)
Signed by Salomon Fick MA on 01/01/2011 at 10:46:38    PHONE NOTE  Call back at Home Phone: [phone]  Caller's Cell Phone #: [phone]  Caller: patient  Department: Neurology  Call for: Laural Benes    Reason for Call: [facility] [phone] needs approval on rebif - pt gets rebif from manufacturer (can't provide # but it's on a starter packet here)      Initial call taken by: Young Berry,  January 01, 2011 8:54 AM      FOLLOW UP  See notes yesterday it is noted and I have already called for a prior auth for this patient earlier this week.   Follow-up by:  Salomon Fick MA,  January 01, 2011 10:41 AM    FOLLOW UP  I called and they said that it is approved for 1 year .   Follow-up by:  Salomon Fick MA,  January 01, 2011 10:45 AM    FOLLOW UP  Patient called   Follow-up by:  Salomon Fick MA,  January 01, 2011 10:45 AM

## 2011-01-27 NOTE — Unmapped (Signed)
Signed by Salomon Fick MA on 01/27/2011 at 11:46:13    Prescriptions:  REBIF 44 MCG/0.5ML SOLN (INTERFERON BETA-1A) 1 injection TIW  #36 x 3   Entered by: Salomon Fick MA   Authorized by: Nestor Lewandowsky MD   Signed by: Salomon Fick MA on 01/27/2011   Method used: Telephoned to ...     [facility] (retail)     [address], [place]  [address]     Ph: [phone]     Fax: [phone]   RxID: [id number]

## 2011-01-28 ENCOUNTER — Encounter: Admit: 2011-01-28

## 2011-01-28 ENCOUNTER — Encounter

## 2011-02-02 VITALS — BP 145/63 | HR 68 | Temp 97.80000°F | Resp 20 | Ht 63.0 in | Wt 206.0 lb

## 2011-02-08 NOTE — Unmapped (Signed)
Signed by Generic  UCP Provider on 02/08/2011 at 00:00:00  Rheumatology      Imported By: Coletta Memos 03/30/2011 22:39:25    _____________________________________________________________________    External Attachment:    Please see Taris Galindo EMR for this document.

## 2011-02-11 ENCOUNTER — Encounter: Admit: 2011-02-11

## 2011-03-02 ENCOUNTER — Ambulatory Visit: Admit: 2011-03-02

## 2011-03-03 NOTE — Unmapped (Signed)
Signed by Salomon Fick MA on 03/03/2011 at 16:21:57    PHONE NOTE  Caller's Cell Phone #: [phone]  Caller: patient  Department: Neurology  Call for: [facility]    Reason for Call: pt is having difficulty walking      Initial call taken by: Juliette Alcide Rippy,  March 03, 2011 2:27 PM      New Orders:  Urinalysis   (UA) (7909) [CPT-81000]  Urine Culture & Sensitivity [CPT-87086]  CBC with Differential   (CBCD) (4540) [*CPT-85025]  FOLLOW UP  check for infection  Follow-up by:  Buel Ream CNP,  March 03, 2011 3:18 PM    FOLLOW UP  [facility] [id number] .   Follow-up by:  Salomon Fick MA,  March 03, 2011 4:18 PM      Process Orders  Check Orders Results:      EMR Link Lab: ABN not required for this insurance  Tests Sent for requisitioning (March 03, 2011 4:19 PM):      03/03/2011: EMR Link Lab -- Urinalysis   (UA) (7909) [CPT-81000] (signed)      03/03/2011: EMR Link Lab -- Urine Culture & Sensitivity [CPT-87086] (signed)

## 2011-04-01 NOTE — Unmapped (Signed)
Signed by Salomon Fick MA on 04/01/2011 at 11:32:44    Prescriptions:  PROVIGIL 200 MG TABS (MODAFINIL) 1 by mouth bid  #60 x 5   Entered by: Salomon Fick MA   Authorized by: Nestor Lewandowsky MD   Signed by: Salomon Fick MA on 04/01/2011   Method used: Telephoned to ...     [facility] (retail)     [address], [place]  [address]     Ph: [phone]     Fax: [phone]   RxID: [id number]

## 2011-04-06 NOTE — Unmapped (Signed)
Signed by Seymour Bars MA on 04/06/2011 at 15:08:08    PHONE NOTE  Caller's Cell Phone #: [phone]  Caller: patient  Department: Neurology  Call for: [facility]    Reason for Call: refill: provigil, uses [facility] [phone], OUT OF MEDS. [facility] didn't get the rx when it was called in last week      Initial call taken by: Juliette Alcide Rippy,  April 06, 2011 2:59 PM      Prescriptions:  PROVIGIL 200 MG TABS (MODAFINIL) 1 by mouth bid  #60 x 5   Entered by: Seymour Bars MA   Authorized by: Nestor Lewandowsky MD   Signed by: Seymour Bars MA on 04/06/2011   Method used: Telephoned to ...     [facility] (retail)     [address], [place]  [address]     Ph: [phone]     Fax: [phone]   RxID: [phone]

## 2011-05-13 NOTE — Unmapped (Signed)
THE [facility]     PATIENT NAME:   Melanie [name], Melanie [name]                   MR #:  [id number]   DATE OF BIRTH:  [birth year]                        ACCOUNT #:  [id number]   ED PHYSICIAN:   Alta Corning, M.D.              ROOM #:   PRIMARY:        Simeon Craft, M.D.            NURSING UNIT:  ED   REFERRING:      Selected Referral Pt              FC:  S   DICTATED BY:    Jocsan Mcginley C. Ronne Binning, M.D.             ADMIT DATE:  05/13/2011   VISIT DATE:     05/13/2011                        DISCHARGE DATE:                               EMERGENCY DEPARTMENT NOTE     *-*-*     CHIEF COMPLAINT:  Cough and shortness of breath.     HISTORY OF PRESENT ILLNESS:  This is a 55 year old very pleasant lady.  She   is here today for the chief complaint above.  She has a history of asthma and   hypertension as well as multiple sclerosis and fibromyalgia.  She was in her   usual state of health until last Friday, about six days ago.  She went   outside to watch her son play baseball.  Following this, she developed acute   cough and shortness of breath as well as a sensation of wheezing.  She is   able use her albuterol with relief; however, she feels like symptoms are   returning frequently.  Since then she has had somewhat of an intractable   cough associated with somewhat progressive shortness of breath.  She now has   coughed to the point where she gets bilateral ribcage pain.  This may be   about 6/10.  Coughing exacerbates it; nothing really relieves it.  It is   associated with throat pain as well as some congestion.  She has been   coughing up thick, dark phlegm as well over the last few days.  It was   initially getting better, particularly with her albuterol treatments.   However, tonight it is getting worse to the point where she is coughing so   much and so short of breath, that she is having difficulty working overnight.   Denies pleuritic chest pain, denies lower extremity swelling.  Denies recent   period of immobility.  Denies hemoptysis.  Denies shortness of breath at   rest.  States she feels that she is wheezing during her periods of shortness   of breath. No dizziness, lightheadedness, no palpitations, no history of   heart disease.     PAST MEDICAL HISTORY:     1.  Hypertension.   2.  Obstructive sleep apnea.   3.  History of dyspnea on exertion.   4.  Fibromyalgia.  5.  History of asthma.   6.  History of bronchitis.   7.  History of multiple sclerosis with optic neuritis.     MEDICATIONS:     1.  Lisinopril/hydrochlorothiazide.   2.  Ibuprofen.   3.  Ativan.   4.  Flovent.   5.  Provigil.   6.  Baclofen.     ALLERGIES:     1.  Compazine.     FAMILY HISTORY:  Noncontributory.     SOCIAL HISTORY:  Negative x3.     REVIEW OF SYSTEMS:  Negative other than that in the HPI.     PHYSICAL EXAMINATION:     VITAL SIGNS:  Temperature 96.3, respiratory rate 18, pulse initially 113, BP   161/110, pulse ox 96% on room air.   GENERAL:  This is a well-developed, well-nourished lady who is in no acute   distress.  She is very pleasant, alert, oriented, cooperative throughout the   entire exam.   HEENT:  Sclerae white, conjunctivae noninjected.  Mucous membranes are moist.   NECK:  Supple, no thyromegaly.   CARDIOVASCULAR:  Regular rate and rhythm, no murmurs, gallops noted.   LUNGS:  Do have very faint end-expiratory wheezing with right-sided coarse   breath sounds.  These are decreased after the patient coughs, but still   present.   ABDOMEN:  Soft, nontender, nondistended.   EXTREMITIES:  With no significant swelling.  No stridor.  No tenderness to   palpation along the chest wall.     CHEST X-RAY:  Negative for any evidence of infiltrate or pneumonia.     EMERGENCY DEPARTMENT COURSE/MEDICAL DECISION MAKING:  The patient was   admitted to A 10, seen by myself as well as the attending who agrees with the   plan of care.  For symptomatic relief, she was given albuterol nebulizer x2   at which point she states she felt  much better.  She was able to ambulate   about the emergency department without becoming significantly short of   breath.  At this time, this does appear to be a bronchitis type syndrome,   especially given her clinical history of a cough triggered by environmental   allergens and her history of asthma as well given her lung exam.  At this   point, I do not believe she has a pulmonary embolism; however, she was   instructed that if her shortness of breath gets worse and particularly does   not resolve, she should return immediately to the emergency department.  We   would be glad to extend her workup; however, the patient's clinical picture   is very consistent with acute bronchitis.  Therefore, she will be treated   with a Z-Pak to take as directed and prednisone 40 mg by mouth every day x5   days.  She was given Zyrtec as well, take once per day for the next 30 days,   given her history of possible by seasonal allergies contributing to her   picture.  She was also given Vicodin for her cough to take only at night and   not before work.  She was instructed to follow up with her primary care   physician, particularly regarding her ongoing asthma exacerbations.  She is   having to use her albuterol somewhat frequently, at least three times per   week.  Particularly if this gets worse, if she develops any new shortness of   breath despite this treatment, fever, chills, worsening  cough, any chest pain   at all, she should return immediately to the emergency department.     The patient verbalized agreement with the plan of care and she was discharged   in stable condition.       *-*-*                                             _______________________________________   TCW/nb                                 _____   D:  05/13/2011 06:23                  Ardra Kuznicki C. Ronne Binning, M.D.   T:  05/13/2011 14:59   Job #:  [id number]                                         _______________________________________                                           _____                                         Alta Corning, M.D.                                  EMERGENCY DEPARTMENT NOTE                                                                PAGE    1 of   1                                         Alta Corning, M.D.                                  EMERGENCY DEPARTMENT NOTE                                                                PAGE    1 of   1

## 2011-05-13 NOTE — Unmapped (Signed)
Signed by   LinkLogic on 05/13/2011 at 07:04:38  Patient: Melanie [name]  Note: All result statuses are Final unless otherwise noted.    Tests: (1) DIAG-CHEST PA & LATERAL [phone])    Order Note: RadNet Accession Number: [id number]    Order Note:                                [facility]     Patient: [name], [name]   DOB:     07/13/1956   MRN:     [id number]   FIN:       [id number]   Accn#:   [id number]                                  Diagnostic Radiology     Exam                       Exam Date/Time       Ordering Physician   DIAG-CHEST PA & LATERAL    05/13/2011 05:16 EDT [facility] MD, Barbara Cower T     Reason for Exam   pain     Report   PA and lateral views of the chest dated May 13, 2011.     Indication: Pain, shortness of breath.     Comparison: October 01, 2008.     Findings:     The cardiac and mediastinal silhouettes are normal. The lungs are clear   bilaterally. There is no pleural effusion.     Impression:     No acute cardiopulmonary disease.     The Initial report of this examination was provided by a    ***Resident Radiologist***     The report becomes the final report only after image review and report   signature by the Staff Radiologist.     Approved by Laurice Record, M.D. on 05/13/2011 6:23 AM   ********** VERIFIED REPORT **********     Dictated: 05/13/2011 5:27 am      Dorathy Kinsman M.D., [facility]   Signed (Electronic Signature):  Elmer Bales) MD, Deatra Ina     05/13/11   7:04   Resident:  Dorathy Kinsman M.D., [facility]     Technologist: TJR    Order Note:   EMR Routing to: DADDABBO, Marlana Salvage - copy to - [id number]    Note: An exclamation mark (!) indicates a result that was not dispersed into   the flowsheet.  Document Creation Date: 05/13/2011 7:04 AM  _______________________________________________________________________    (1) Order result status: Final  Collection or observation date-time: 05/13/2011 05:16:16  Requested date-time: 05/13/2011 05:05:00  Receipt date-time:   Reported  date-time: 05/13/2011 07:04:07  Referring Physician: Nelida Gores REFERRAL PT  Ordering Physician:  Non-EMR Physician [facility])  Specimen Source: RAD&Rad Type  Source: QRS  Filler Order Number: [id number]  Lab site: [facility]

## 2011-05-13 NOTE — Unmapped (Signed)
Signed by   LinkLogic on 05/13/2011 at 15:07:35  Patient: Melanie [name]  Note: All result statuses are Final unless otherwise noted.    Tests: (1)  (MR)    Order Note:                                         THE [facility]     PATIENT NAME:   CECELY, [name]                   MR #:  [id number]  DATE OF BIRTH:  1956/03/17                        ACCOUNT #:  [id number]  ED PHYSICIAN:   Alta Corning, M.D.              ROOM #:  PRIMARY:        Simeon Craft, M.D.            NURSING UNIT:  ED  REFERRING:      Selected Referral Pt              FC:  S  DICTATED BY:    Trenton C. Ronne Binning, M.D.             ADMIT DATE:  05/13/2011  VISIT DATE:     05/13/2011                        DISCHARGE DATE:                               EMERGENCY DEPARTMENT NOTE     *-*-*     CHIEF COMPLAINT:  Cough and shortness of breath.     HISTORY OF PRESENT ILLNESS:  This is a 55 year old very pleasant lady.  She  is here today for the chief complaint above.  She has a history of asthma and  hypertension as well as multiple sclerosis and fibromyalgia.  She was in her  usual state of health until last Friday, about six days ago.  She went  outside to watch her son play baseball.  Following this, she developed acute  cough and shortness of breath as well as a sensation of wheezing.  She is  able use her albuterol with relief; however, she feels like symptoms are  returning frequently.  Since then she has had somewhat of an intractable  cough associated with somewhat progressive shortness of breath.  She now has  coughed to the point where she gets bilateral ribcage pain.  This may be  about 6/10.  Coughing exacerbates it; nothing really relieves it.  It is  associated with throat pain as well as some congestion.  She has been  coughing up thick, dark phlegm as well over the last few days.  It was  initially getting better, particularly with her albuterol treatments.  However, tonight it is getting worse to the point where she is coughing  so  much and so short of breath, that she is having difficulty working overnight.  Denies pleuritic chest pain, denies lower extremity swelling.  Denies recent  period of immobility.  Denies hemoptysis.  Denies shortness of breath at  rest.  States she feels that she is wheezing during her periods of  shortness  of breath. No dizziness, lightheadedness, no palpitations, no history of  heart disease.     PAST MEDICAL HISTORY:     1.  Hypertension.  2.  Obstructive sleep apnea.  3.  History of dyspnea on exertion.  4.  Fibromyalgia.  5.  History of asthma.  6.  History of bronchitis.  7.  History of multiple sclerosis with optic neuritis.     MEDICATIONS:     1.  Lisinopril/hydrochlorothiazide.  2.  Ibuprofen.  3.  Ativan.  4.  Flovent.  5.  Provigil.  6.  Baclofen.     ALLERGIES:     1.  Compazine.     FAMILY HISTORY:  Noncontributory.     SOCIAL HISTORY:  Negative x3.     REVIEW OF SYSTEMS:  Negative other than that in the HPI.     PHYSICAL EXAMINATION:     VITAL SIGNS:  Temperature 96.3, respiratory rate 18, pulse initially 113, BP  161/110, pulse ox 96% on room air.  GENERAL:  This is a well-developed, well-nourished lady who is in no acute  distress.  She is very pleasant, alert, oriented, cooperative throughout the  entire exam.  HEENT:  Sclerae white, conjunctivae noninjected.  Mucous membranes are moist.  NECK:  Supple, no thyromegaly.  CARDIOVASCULAR:  Regular rate and rhythm, no murmurs, gallops noted.  LUNGS:  Do have very faint end-expiratory wheezing with right-sided coarse  breath sounds.  These are decreased after the patient coughs, but still  present.  ABDOMEN:  Soft, nontender, nondistended.  EXTREMITIES:  With no significant swelling.  No stridor.  No tenderness to  palpation along the chest wall.     CHEST X-RAY:  Negative for any evidence of infiltrate or pneumonia.     EMERGENCY DEPARTMENT COURSE/MEDICAL DECISION MAKING:  The patient was  admitted to A 10, seen by myself as well as the attending  who agrees with the  plan of care.  For symptomatic relief, she was given albuterol nebulizer x2  at which point she states she felt much better.  She was able to ambulate  about the emergency department without becoming significantly short of  breath.  At this time, this does appear to be a bronchitis type syndrome,  especially given her clinical history of a cough triggered by environmental  allergens and her history of asthma as well given her lung exam.  At this  point, I do not believe she has a pulmonary embolism; however, she was  instructed that if her shortness of breath gets worse and particularly does  not resolve, she should return immediately to the emergency department.  We  would be glad to extend her workup; however, the patient's clinical picture  is very consistent with acute bronchitis.  Therefore, she will be treated  with a Z-Pak to take as directed and prednisone 40 mg by mouth every day x5  days.  She was given Zyrtec as well, take once per day for the next 30 days,  given her history of possible by seasonal allergies contributing to her  picture.  She was also given Vicodin for her cough to take only at night and  not before work.  She was instructed to follow up with her primary care  physician, particularly regarding her ongoing asthma exacerbations.  She is  having to use her albuterol somewhat frequently, at least three times per  week.  Particularly if this gets worse, if she develops any new shortness of  breath despite this treatment, fever, chills, worsening cough, any chest pain  at all, she should return immediately to the emergency department.     The patient verbalized agreement with the plan of care and she was discharged  in stable condition.        *-*-*                                              _______________________________________  TCW/nb                                 _____  D:  05/13/2011 06:23                  Trenton C. Ronne Binning, M.D.  T:  05/13/2011 14:59  Job #:   [id number]                                        _______________________________________                                         _____                                        Alta Corning, M.D.                                   EMERGENCY DEPARTMENT NOTE                                                               PAGE    1 of   1    Note: An exclamation mark (!) indicates a result that was not dispersed into   the flowsheet.  Document Creation Date: 05/13/2011 3:07 PM  _______________________________________________________________________    (1) Order result status: Final  Collection or observation date-time: 05/13/2011 00:00  Requested date-time:   Receipt date-time:   Reported date-time:   Referring Physician: Selected Pt  Ordering Physician:  Reviewed In [facility])  Specimen Source:   Source: DBS  Filler Order Number: [id number]  Lab site:

## 2011-06-16 ENCOUNTER — Encounter

## 2011-06-16 ENCOUNTER — Ambulatory Visit: Admit: 2011-06-16

## 2011-07-07 NOTE — Unmapped (Signed)
Spoke with pt. Has odor most of time.no itch no noticeable d/c denies douching. Is a large person. Wears cotton underwear. Had used flagyl once and seemed to clear but is still chronic. Wanted to know what can do naturally. Reviewed hygiene. Void crossing legs, keeps BM's moving. Hydrate well, avoid aggressive cleansing which can be like douching sometimes. May try lactobacilli, ea

## 2011-07-07 NOTE — Unmapped (Signed)
Patient states that she is not properly digesting her food.  After eating food she emits a terrible odor.Wants to discuss the problem with doctor.

## 2011-07-07 NOTE — Unmapped (Signed)
Patient having vaginal odor. She denies discharge or irritation. She is concerned because she has had this odor all her life is there anything she can do for this??

## 2011-07-15 NOTE — Unmapped (Signed)
Pt needs something for pain on whole left side, very painful - using a different pharm [facility] [phone] - can MA call when complete, pt must go to work tonight

## 2011-07-15 NOTE — Unmapped (Signed)
Patient is having pain in her lower left side,  Left are pain.  Would like something called into pharmacy    [phone]

## 2011-07-16 MED ORDER — gabapentin (NEURONTIN) 300 MG capsule
300 | ORAL_CAPSULE | Freq: Three times a day (TID) | ORAL | Status: AC
Start: 2011-07-16 — End: 2012-11-02

## 2011-07-16 NOTE — Unmapped (Signed)
Called and informed pt of the Rx for ,gabapentin, called the Rx into [facility].

## 2011-08-02 MED ORDER — modafinil (PROVIGIL) 200 MG tablet
200 | ORAL_TABLET | Freq: Every morning | ORAL | 1.00 refills | 30.00000 days | Status: AC
Start: 2011-08-02 — End: 2014-10-01

## 2011-08-02 NOTE — Unmapped (Signed)
NEW SCRIPT FOR MODAFINIL 200MG -  [facility]-  [id number].    SHE WILL BE CALLING FOR INS UPDATE FOR ASSISTANCE ON REBIF

## 2011-08-02 NOTE — Unmapped (Signed)
[facility] called [facility],

## 2011-08-23 NOTE — Unmapped (Signed)
Patients FMLA has expired and she took off last night so she will need that updated as soon as possible.    She also needs her Rebis and Provigel called into [facility] [phone].

## 2011-08-23 NOTE — Unmapped (Signed)
A prescription was called into to Rebis and it needs to go through a specialty pharmacy. [facility] [phone], the max they will pay for is a 30 day supply.  Patient has not been notified.

## 2011-08-23 NOTE — Unmapped (Signed)
Rx called to pharmacy

## 2011-08-23 NOTE — Unmapped (Signed)
Rx called to pharmacy-patient advised to drop off the FMLA forms

## 2011-08-31 NOTE — Unmapped (Signed)
Patient is wanting to know if you received her FMLA paper work? Can you please call patient back to let her know if you received it or not.

## 2011-08-31 NOTE — Unmapped (Signed)
Pt informed that we have her paperwork-per Silva Bandy-

## 2011-09-06 MED ORDER — interferon beta-1a (REBIF) 44 mcg/0.5 mL injection
44 | SUBCUTANEOUS | 3.00 refills | 28.00000 days | Status: AC
Start: 2011-09-06 — End: 2012-09-07

## 2011-09-06 NOTE — Unmapped (Signed)
OK to restart Rebif.

## 2011-09-06 NOTE — Unmapped (Signed)
Patient needs for Melanie [name] to contact the MS society to cover her medication.  After she moved they stopped covering her medications.  Please call Ms.Melanie [name].

## 2011-09-06 NOTE — Unmapped (Signed)
HAVNT TAKEN REBIF IN FEW MONTHS- WANTS TO START BACK UP- STARTING AT A LOW DOSE- FAX [phone] REBIF MANUFACTURE ATT: KERRI    WANTS THIS DONE TODAY? CALL HER AND LET HER KNOW

## 2011-09-06 NOTE — Unmapped (Signed)
Ok to restart the Rebif

## 2011-09-06 NOTE — Unmapped (Signed)
Number given to Rebif for patient assistance

## 2011-10-11 ENCOUNTER — Ambulatory Visit: Admit: 2011-10-11 | Discharge: 2011-10-11 | Payer: PRIVATE HEALTH INSURANCE

## 2011-10-11 DIAGNOSIS — N3090 Cystitis, unspecified without hematuria: Secondary | ICD-10-CM

## 2011-10-11 LAB — POCT URINALYSIS DIPSTICK, NONAUTOMATED; W/O MICRO
POCT - ALT (SGPT): 1.01
POCT - Bilirubin, UA: NEGATIVE
POCT - Blood, UA: NEGATIVE
POCT - Glucose, UA: NEGATIVE
POCT - Ketones, UA: NEGATIVE
POCT - Leukocytes Esterase, UA: NEGATIVE
POCT - Nitrite, UA: NEGATIVE
POCT - Protein, UA: NEGATIVE
POCT - Urobilinogen, UA: NEGATIVE
POCT - pH, UA: 6

## 2011-10-11 MED ORDER — ciprofloxacin (CIPRO) 500 MG tablet
500 | ORAL_TABLET | Freq: Two times a day (BID) | ORAL | Status: AC
Start: 2011-10-11 — End: 2012-03-02

## 2011-10-11 NOTE — Unmapped (Signed)
Subjective UTI  HPI:   Patient ID: Melanie [name] is a 56 y.o. female.    Chief Complaint:  HPI Comments: States started with some symptoms over a week ago. Last Thursday had more and seemed to worsen as she restarted her savella. Has had some back pain                  ROS:   Review of Systems       Objective:   Physical Exam   Constitutional: She is oriented to person, place, and time.        Obese in NAD   Abdominal:        Obese abdomen. Tender suprapubically  No CVA tenderness   Neurological: She is alert and oriented to person, place, and time.             Filed Vitals:    10/11/11 1049   Height: 5' 4 (1.626 m)   Weight: 242 lb (109.77 kg)     Body mass index is 41.54 kg/(m^2).  Body surface area is 2.23 meters squared.                Assessment/Plan:     Cystitis-treat empirically. Not enough urine to send for culutre

## 2011-10-11 NOTE — Unmapped (Signed)
Patient coming at 10:30

## 2011-10-11 NOTE — Unmapped (Signed)
Come over now

## 2011-10-11 NOTE — Unmapped (Signed)
Pt thinks she has a UTI and would like an appt today but would only like to see you and she wants to be seen today. She would like to know if you would squeeze her into your schedule? Please advise

## 2011-10-21 MED ORDER — lisinopril-hydrochlorothiazide (PRINZIDE,ZESTORETIC) 20-25 mg per tablet
20-25 | ORAL_TABLET | Freq: Every day | ORAL | 0.00 refills | 90.00000 days | Status: AC
Start: 2011-10-21 — End: 2012-10-20

## 2011-10-28 NOTE — Unmapped (Signed)
Patient wants a refill of lisinopril. Pt is out.

## 2011-10-28 NOTE — Unmapped (Signed)
I called the pharmacy and they did receive the refill authorization from [place] on 10/25. They will get it ready. Patient advised

## 2012-01-05 ENCOUNTER — Ambulatory Visit: Admit: 2012-01-05

## 2012-01-13 NOTE — Unmapped (Signed)
Number given to mail in pharmacy [phone] or [phone]

## 2012-01-13 NOTE — Unmapped (Signed)
Pt is stating she is needing to talk to you about her mail in pharmacy. Please call to advise.

## 2012-01-21 MED ORDER — baclofen (LIORESAL) 10 MG tablet
10 | ORAL_TABLET | Freq: Three times a day (TID) | ORAL | Status: AC
Start: 2012-01-21 — End: 2012-09-07

## 2012-01-21 NOTE — Unmapped (Signed)
Pt needs refill on Baclofen   [facility] ([place])  Pt does not have number

## 2012-01-25 NOTE — Unmapped (Signed)
Pt says she went to refill for Provigil and pharmacy said prescription has to be renewed. pt says dr has to call pharmacy and have it renewed  [facility]  Ph#[phone]  Member ID [id number]

## 2012-01-31 ENCOUNTER — Ambulatory Visit: Admit: 2012-01-31

## 2012-02-28 NOTE — Unmapped (Signed)
Pt calling again about getting earlier appt

## 2012-02-28 NOTE — Unmapped (Signed)
Since the beginning of feb has been experiencing frequent urination.    Can't make it to the bathroom. Thinks she has a uti ?    Hip / knees / leg pain. Hurts to walk    Asking if possible to schedule with Dr. Algis Downs tomorrow.    Please call [place] at [phone]

## 2012-02-28 NOTE — Unmapped (Signed)
Please put this patient on at 10:00 March 13th (Wednesday). Please inform the patient of this apt.

## 2012-02-28 NOTE — Unmapped (Signed)
Can we fit into your schedule

## 2012-02-28 NOTE — Unmapped (Addendum)
Pt losing ability to walk, asking for a sooner appt - scheduler did ck for cancellations and NPShirley

## 2012-02-28 NOTE — Unmapped (Signed)
Please schedule this pt for 03.06.13 at 730 am with dr daddabbo the pt has been informed

## 2012-02-28 NOTE — Unmapped (Signed)
Patient has appt 03/06/12 at 10:30

## 2012-02-29 NOTE — Unmapped (Signed)
Pt wants to speak with MA regarding earlier appt.

## 2012-02-29 NOTE — Unmapped (Signed)
scheduled

## 2012-02-29 NOTE — Unmapped (Signed)
Patient has appt in the am 03/01/12 at 7:30 advised to have urine and urine culture done at her PCP

## 2012-03-02 ENCOUNTER — Ambulatory Visit: Admit: 2012-03-02 | Discharge: 2012-03-02 | Payer: PRIVATE HEALTH INSURANCE

## 2012-03-02 DIAGNOSIS — R3589 Other polyuria: Secondary | ICD-10-CM

## 2012-03-02 LAB — POCT URINALYSIS DIPSTICK, NONAUTOMATED; W/O MICRO
POCT - ALT (SGPT): 1.03 (ref 0–55)
POCT - Bilirubin, UA: NEGATIVE
POCT - Blood, UA: NEGATIVE
POCT - Glucose, UA: 200
POCT - Ketones, UA: NEGATIVE
POCT - Leukocytes Esterase, UA: NEGATIVE
POCT - Nitrite, UA: NEGATIVE
POCT - Urobilinogen, UA: NEGATIVE
POCT - pH, UA: 6

## 2012-03-02 LAB — POCT GLUCOSE, BLOOD: POCT -  Glucose Monitoring Device: 221

## 2012-03-02 MED ORDER — metFORMIN (GLUCOPHAGE) 500 MG tablet
500 | ORAL_TABLET | Freq: Two times a day (BID) | ORAL | Status: AC
Start: 2012-03-02 — End: 2012-12-30

## 2012-03-02 MED ORDER — blood-glucose meter kit
1.00 refills | 30.00000 days | Status: AC
Start: 2012-03-02 — End: 2013-03-02

## 2012-03-02 MED ORDER — blood sugar diagnostic Strp
ORAL_STRIP | 0.00 refills | 30.00000 days | Status: AC
Start: 2012-03-02 — End: ?

## 2012-03-02 NOTE — Unmapped (Signed)
Subjective  HPI:   Patient ID: Melanie [name] is a 56 y.o. female.    Chief Complaint:  HPI Comments: Urinating/urgency to urinate since beginning of February. States at that time she was having flare of her MS and when she limped to get to [place] she wouldn't make it on time.                   ROS:   Review of Systems   Constitutional: Positive for activity change and fatigue. Negative for chills.        General fatigue in past month. Missed 3 days work already due to weakness in her legs   HENT: Negative for ear pain, congestion and postnasal drip.    Eyes: Positive for visual disturbance.   Respiratory: Negative for chest tightness, shortness of breath and wheezing.    Genitourinary:        Polyuria   Neurological: Positive for weakness.          Objective:   Physical Exam   Constitutional: She appears well-developed and well-nourished.   HENT:   Head: Normocephalic.   Right Ear: External ear normal.   Left Ear: External ear normal.   Mouth/Throat: Oropharynx is clear and moist.   Eyes: Conjunctivae are normal. Pupils are equal, round, and reactive to light.   Neck: Normal range of motion. Neck supple. No thyromegaly present.   Cardiovascular: Normal rate, normal heart sounds and intact distal pulses.    No murmur heard.  Pulmonary/Chest: Effort normal and breath sounds normal. She has no wheezes. She exhibits no tenderness.   Abdominal: Soft. Bowel sounds are normal.   Lymphadenopathy:     She has no cervical adenopathy.   Neurological: Coordination abnormal.   Psychiatric:        She's a bit scattered and mood a bit flat but good eye contact, engaged             Filed Vitals:    03/02/12 1611   BP: 112/64   Temp: 98.4 ??F (36.9 ??C)   TempSrc: Oral   Weight: 249 lb (112.946 kg)     There is no height on file to calculate BMI.  There is no height on file to calculate BSA.                Assessment/Plan:     New onset Type 2 diabetes-Start Metformin-check labs. Side effects discussed. Referral to Diabetic education. In  Meantime stop sodas, reduce sugars, increase water,cut down portions. Start checking BS bid, fasting and PP   MS-flare may be attributable to her Diabetes-She'll see Dr. Grayland Jack next week  Hypertension controlled

## 2012-03-02 NOTE — Unmapped (Signed)
Check your blood sugars twice a day for now, fasting before your breakfast and 2 hours after a meal or before your bedtime.

## 2012-03-06 ENCOUNTER — Inpatient Hospital Stay: Admit: 2012-03-06 | Payer: PRIVATE HEALTH INSURANCE | Attending: Family

## 2012-03-06 ENCOUNTER — Ambulatory Visit: Admit: 2012-03-06 | Discharge: 2012-03-06 | Payer: PRIVATE HEALTH INSURANCE | Attending: Neurology

## 2012-03-06 DIAGNOSIS — N390 Urinary tract infection, site not specified: Secondary | ICD-10-CM

## 2012-03-06 DIAGNOSIS — G35 Multiple sclerosis: Secondary | ICD-10-CM

## 2012-03-06 LAB — CBC AND DIFFERENTIAL
Basophils Absolute: 0 10*3/uL (ref 0.0–0.2)
Basophils Relative: 0 % (ref 0–3)
Eosinophils Absolute: 0.2 10*3/uL (ref 0.0–0.4)
Eosinophils Relative: 2 % (ref 0–7)
Hematocrit: 45.3 % (ref 34.0–46.6)
Hemoglobin: 15.7 g/dL (ref 11.1–15.9)
Immature Granulocytes Absolute: 0 10*3/uL (ref 0.0–0.1)
Immature Granulocytes: 0 % (ref 0–2)
Lymphocytes Absolute: 4.1 10*3/uL (ref 0.7–4.5)
Lymphocytes Relative: 36 % (ref 14–46)
MCH: 31.6 pg (ref 26.6–33.0)
MCHC: 34.7 g/dL (ref 31.5–35.7)
MCV: 91 fL (ref 79–97)
Monocytes Absolute: 0.9 10*3/uL (ref 0.1–1.0)
Monocytes Relative: 8 % (ref 4–13)
Neutrophils Absolute: 5.9 10*3/uL (ref 1.8–7.8)
Neutrophils Relative: 54 % (ref 40–74)
Platelets: 281 10*3/uL (ref 140–415)
RBC: 4.97 x10E6/uL (ref 3.77–5.28)
RDW: 13.2 % (ref 12.3–15.4)
WBC: 11.1 10*3/uL (ref 4.0–10.5)

## 2012-03-06 LAB — HEPATIC FUNCTION PANEL
ALT: 89 IU/L (ref 0–32)
AST: 74 IU/L (ref 0–40)
Albumin: 4.5 g/dL (ref 3.5–5.5)
Alkaline Phosphatase: 100 IU/L (ref 25–150)
Bilirubin, Direct: 0.11 mg/dL (ref 0.00–0.40)
Total Bilirubin: 0.3 mg/dL (ref 0.0–1.2)
Total Protein: 8.1 g/dL (ref 6.0–8.5)

## 2012-03-06 LAB — URINALYSIS WITH MICROSCOPIC
Bilirubin, UA: NEGATIVE
Blood, UA: NEGATIVE
Glucose, UA: NEGATIVE mg/dL
Ketones, UA: NEGATIVE mg/dL
Nitrite, UA: NEGATIVE
RBC, UA: 4 /HPF — ABNORMAL HIGH (ref 0–3)
Specific Gravity, UA: 1.02 (ref 1.005–1.035)
Squam Epithel, UA: 1 /HPF (ref 0–5)
Urobilinogen, UA: <2 mg/dL (ref 0.2–1.9)
WBC, UA: 9 /HPF — ABNORMAL HIGH (ref 0–5)
pH, UA: 5.5 (ref 5.0–8.0)

## 2012-03-06 LAB — URINE CULTURE

## 2012-03-06 LAB — THYROID FUNCTION CASCADE: TSH: 3.44 u[IU]/mL (ref 0.450–4.500)

## 2012-03-06 NOTE — Unmapped (Signed)
Subjective:      Patient ID: Melanie [name] is a 56 y.o. female.    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  Pt is currently on Rebif and is tolerating it well.  She has not had an MRI since January 2012.  Since pts last visit she feels her condition is getting worse.  She is experiencing cognitive issues including concentration and memory issues.  She has also started to urinate uncontrollably and has missed work as a result.  Pt is having trouble walking due to pain in both knees and L hip pain.  She also complains of stress at work which causes her to be depressed.              Histories:     She has a past medical history of Asthma; Multiple sclerosis; Hypertension; and Diabetes mellitus.    She has past surgical history that includes Knee surgery; Appendectomy; and Hysterectomy.    Her family history includes Alcohol abuse in her father and other; Brain cancer in her other; COPD in her father; Cancer in her father, maternal grandmother, mother, and sister; Diabetes in her daughter, paternal aunt, paternal grandfather, and paternal uncle; Skin cancer in her other; Stomach cancer in her other; and Throat cancer in her other.    She reports that she has never smoked. She does not have any smokeless tobacco history on file. She reports that she drinks alcohol. She reports that she does not use illicit drugs.      Review of Systems   Constitutional: Positive for fatigue.   Musculoskeletal: Positive for gait problem.        L hip is painfuil, knees hurt when pt walks   Neurological: Positive for numbness.        N/T in R thigh   Psychiatric/Behavioral:        STM issues, word finding problems       Allergies:   Compazine    Medications:     Outpatient Encounter Prescriptions as of 03/06/2012   Medication Sig Dispense Refill   ??? albuterol (PROVENTIL HFA;VENTOLIN HFA) 90 mcg/actuation inhaler Inhale 2 puffs into the lungs.  Every 4 to 6 hours as needed.        ??? baclofen (LIORESAL) 10 MG tablet Take 1 tablet (10 mg total) by mouth 3 times a day.  270 tablet  3   ??? blood sugar diagnostic Strp Test blood sugars up to twice a day  50 strip  6   ??? blood-glucose meter kit Use as instructed  1 each  0   ??? blood-glucose meter kit Use as instructed  1 each  0   ??? gabapentin (NEURONTIN) 300 MG capsule Take 1 capsule (300 mg total) by mouth 3 (three) times daily.  90 capsule  5   ??? ibuprofen (ADVIL,MOTRIN) 800 MG tablet Take 800 mg by mouth every 8 hours as needed.       ??? inhalational spacing device (AEROCHAMBER) Spcr by Misc.(Non-Drug; Combo Route) route. AEROCHAMBER PLUS.  Use with MDI        ??? interferon beta-1a (REBIF) 44 mcg/0.5 mL injection Inject 0.5 mLs (44 mcg total) into the skin every Monday, Wednesday, and Friday.  0.5 mL  11   ??? lisinopril-hydrochlorothiazide (PRINZIDE,ZESTORETIC) 20-25 mg per tablet Take 1 tablet by mouth daily.  90 tablet  3   ??? metFORMIN (  GLUCOPHAGE) 500 MG tablet Take 1 tablet (500 mg total) by mouth 2 times a day with meals.  60 tablet  6   ??? modafinil (PROVIGIL) 200 MG tablet Take 1 tablet (200 mg total) by mouth every morning.  30 tablet  5        Objective:       There were no vitals taken for this visit.    Neurologic Exam     Mental Status   Oriented to person, place, and time.       Level of consciousness: alert  Knowledge: good.     Cranial Nerves   Cranial nerves II through XII intact.     Motor Exam   Muscle bulk: normal  Overall muscle tone: normal  Right arm tone: normal  Left arm tone: normal  Right arm pronator drift: absent  Left arm pronator drift: absent  Right leg tone: normal  Left leg tone: normal     Strength   Right neck flexion: 4/5  Left neck flexion: 4/5  Right neck extension: 4/5  Left neck extension: 4/5  Right deltoid: 4/5  Left deltoid: 4/5  Right biceps: 4/5  Left biceps: 4/5  Right triceps: 4/5  Left triceps: 4/5  Right wrist flexion: 4/5  Left wrist flexion: 4/5  Right wrist  extension: 4/5  Left wrist extension: 4/5  Right interossei: 4/5  Left interossei: 4/5  Right abdominals: 4/5  Left abdominals: 4/5  Right iliopsoas: 4/5  Left iliopsoas: 4/5  Right quadriceps: 4/5  Left quadriceps: 4/5  Right hamstring: 4/5  Left hamstring: 4/5  Right glutei: 4/5  Left glutei: 4/5  Right anterior tibial: 4/5  Left anterior tibial: 4/5  Right posterior tibial: 4/5  Left posterior tibial: 4/5  Right peroneal: 4/5  Left peroneal: 4/5  Right gastroc: 4/5  Left gastroc: 4/5    Gait, Coordination, and Reflexes      Gait  Gait: shuffling and wide-based     Coordination   Finger to nose coordination: normal  Tandem walking coordination: abnormal     Tremor   Resting tremor: absent  Intention tremor: absent      Physical Exam   Constitutional: She is oriented to person, place, and time.   Neurological: She is oriented to person, place, and time. She has an abnormal Tandem Gait Test. She has a normal Finger-Nose-Finger Test.          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  Pt is currently on Rebif and is tolerating it well.  She has not had an MRI since January 2012.  Since pts last visit she feels her condition is getting worse.  She is experiencing cognitive issues including concentration and memory issues.  She has also started to urinate uncontrollably and has missed work as a result.  Pt is having trouble walking due to pain in both knees and L hip pain.  She also complains of stress at work which causes her to be depressed.      Due to pts complaints of cognitive issues, we will refer her for a neuropsych eval.  We will also give her the contact number to the social worker for the [facility].  I would like to obtain and MRI of the brain and c spine.    Plan:     1.  Referral for Neuropsych testing  2. Contact to [facility] Social Worker  3. MRI  of the brain and c spine      Medical Decision Making:  review/order clinical lab tests, review/order radiology tests,  review/order other diagnostic or tx interventions, Permanent chart problem/surgery list reviewed, Permanent chart chronic med/allergy list reviewed and Permanent chart social/family history reviewed    Patient Education:  Patient advised: avoid alcohol, exercise > 20 minutes 3xs per week, on low fat/low cholesterol diet and practice good sleep hygiene  Verbal information and/or pamphlet/literature given to patient: Yes    Risks & Benefits:  Risks, benefits and treatment options discussed with patient.    Is visit primarily counseling/coordination? Yes  Risk of complications: moderate    Time Spent with Patient:  Established: 25 min (16109)      More than 50% of this time was spent discussing:  Management   and Treatment Plan

## 2012-03-07 NOTE — Unmapped (Signed)
Melanie [name] liver enzymes are elevated to AST 74 and ALT 89. She will need to hold her Rebif for 2 weeks and repeat her hepatic panel at that time. Orders are in the chart for the hepatic panel.

## 2012-03-07 NOTE — Unmapped (Signed)
DM ed Darel Hong Dimuzio [phone]  4-3 @ 2:45  swp

## 2012-03-07 NOTE — Unmapped (Signed)
Pt needs to speak with MA about FMLA paperwork.

## 2012-03-08 ENCOUNTER — Ambulatory Visit: Admit: 2012-03-08 | Discharge: 2012-03-08 | Payer: PRIVATE HEALTH INSURANCE

## 2012-03-08 DIAGNOSIS — E119 Type 2 diabetes mellitus without complications: Secondary | ICD-10-CM

## 2012-03-08 NOTE — Unmapped (Signed)
LSUPPORT PLAN  External Discussion/Support Groups    Diabetes Magazine Supscriptions    Diabetes Resource Books    Diabetes Websites   www.diabetes.org   www.eatright.org   www.TourneyLocator.at   ProposalRequests.ca.htm   www.calorieking.com   desa@diabetes -exercise.org   www.diabetesnet.com   Diabetes product websites (medicaiton, meters, etc)    DSMT Follow-Up  Join a fitness center, gym or [employer] for activity/social support.    Work [employer]    Join a [employer] Loss program    Participate in a Diabetes Ambulance person ADA/JDRF office  Lose 1 pound per week.  Join the [facility]  Do exercise 30 min per day.Diabetes Meal Planning Guide  The diabetes meal planning guide is a tool to help you plan your meals and snacks. It is important for people with diabetes to manage their blood sugar levels. Choosing the right foods and the right amounts throughout your day will help control your blood sugar. Eating right can even help you improve your blood pressure and reach or maintain a healthy weight.  CARBOHYDRATE COUNTING MADE EASY  When you eat carbohydrates, they turn to sugar (glucose). This raises your blood sugar level. Counting carbohydrates can help you control this level so you feel better. When you plan your meals by counting carbohydrates, you can have more flexibility in what you eat and balance your medicine with your food intake.  Carbohydrate counting simply means adding up the total amount of carbohydrate grams (g) in your meals or snacks. Try to eat about the same amount at each meal. Foods with carbohydrates are listed below. Each portion below is 1 carbohydrate serving or 15 grams of carbohydrates. Ask your dietician how many grams of carbohydrates you should eat at each meal or snack.  Grains and Starches  1 slice bread  1/2 English muffin or hotdog/hamburger bun  3/4 cup cold cereal (unsweetened)  1/3 cup cooked pasta or rice  1/2 cup starchy  vegetables (corn, potatoes, peas, beans, winter squash)  1 tortilla (6 inches)  1/4 bagel  1 waffle or pancake (size of a CD)  1/2 cup cooked cereal  4 to 6 small crackers  *Whole grain is recommended  Fruit  1 cup fresh unsweetened berries, melon, papaya, pineapple  1 small fresh fruit  1/2 banana or mango  1/2 cup fruit juice (4 ounces unsweetened)  1/2 cup canned fruit in natural juice or water  2 tablespoons dried fruit  12 to 15 grapes or cherries  Milk and Yogurt  1 cup fat-free or 1% milk  1 cup soy milk  6 ounces light yogurt with sugar-free sweetener  6 ounces low-fat soy yogurt  6 ounces plain yogurt  Vegetables  1 cup raw or 1/2 cup cooked is counted as 0 carbohydrates or a free food.  If you eat 3 or more servings at one meal, count them as 1 carbohydrate serving.  Other Carbohydrates  3/4 ounces chips or pretzels  1/2 cup ice cream or frozen yogurt  1/4 cup sherbet or sorbet  2 inch square cake, no frosting  1 tablespoon honey, sugar, jam, jelly, or syrup  2 small cookies  3 squares of graham crackers  3 cups popcorn  6 crackers  1 cup broth-based soup  Count 1 cup casserole or other mixed foods as 2 carbohydrate servings.  Foods with less than 20 calories in a serving may be counted as 0 carbohydrates or a free food.  You may want to purchase a book or computer software that lists the carbohydrate gram counts of different foods. In addition, the nutrition facts panel on the labels of the foods you eat are a good source of this information. The label will tell you how big the serving size is and the total number of carbohydrate grams you will be eating per serving. Divide this number by 15 to obtain the number of carbohydrate servings in a portion. Remember: 1 carbohydrate serving equals 15 grams of carbohydrate.  SERVING SIZES  Measuring foods and serving sizes helps you make sure you are getting the right amount of food. The list below tells how big or small some common serving sizes are.  ?? 1  ounce (oz) of cheese.................................4 stacked dice.   ?? 2 to 3 oz cooked meat.................................Marland KitchenDeck of cards.   ?? 1 teaspoon (tsp)...........................................Marland KitchenTip of little finger.   ?? 1 tablespoon (tbs).......................................Marland KitchenMarland KitchenThumb.   ?? 2 tbs............................................................Marland KitchenGolf ball.   ?? ?? cup..........................................................Marland KitchenHalf of a fist.   ?? 1 cup...........................................................Marland KitchenA fist.   SAMPLE DIABETES MEAL PLAN  Below is a sample meal plan that includes foods from the grain and starches, dairy, vegetable, fruit, and meat groups. A dietician can individualize a meal plan to fit your calorie needs and tell you the number of servings needed from each food group. However, controlling the total amount of carbohydrates in your meal or snack is more important than making sure you include all of the food groups at every meal. You may interchange carbohydrate containing foods (dairy, starches, and fruits).  The meal plan below is an example of a 2000 calorie diet using carbohydrate counting. This meal plan has 17 carbohydrate servings (carb choices).  Breakfast 1 cup oatmeal (2 carb choices)  3/4 cup light yogurt (1 carb choice)  1 cup blueberries (1 carb choice)  1/4 cup almonds   Snack 1 large apple (2 carb choices)  1 low-fat string cheese stick   Lunch Chicken breast salad:  ?? 1 cup spinach   ?? 1/4 cup chopped tomatoes   ?? 2 oz chicken breast, sliced   ?? 2 tbs low-fat [place] dressing   12 whole-wheat crackers (2 carb choices)  12 to 15 grapes (1 carb choice)  1 cup low-fat milk (1 carb choice)   Snack 1 cup carrots  1/2 cup hummus (1 carb choice)   Dinner 3 oz broiled salmon  1 cup brown rice (3 carb choices)   Snack 1 1/2 cups steamed broccoli (1 carb choice) drizzled with 1 tsp olive oil and lemon juice  1 cup light pudding (2 carb choices)   DIABETES MEAL PLANNING  WORKSHEET  Your dietician can use this worksheet to help you decide how many servings of foods and what types of foods are right for you.   Breakfast  Food Group and Servings Carb Choices  Grain/Starches _______________________________________  Dairy ______________________________________________  Vegetable _______________________________________  Fruit _______________________________________________  Meat _______________________________________________  Fat _____________________________________________  Lunch  Food Group and Servings Carb Choices  Grain/Starches ________________________________________  Dairy _______________________________________________  Fruit ________________________________________________  Meat ________________________________________________  Fat _____________________________________________  Dinner  Food Group and Servings Carb Choices  Grain/Starches ________________________________________  Dairy _______________________________________________  Fruit ________________________________________________  Meat ________________________________________________  Fat _____________________________________________  Snacks  Food Group and Servings Carb Choices  Grain/Starches ________________________________________  Dairy _______________________________________________  Vegetable ________________________________________  Fruit ________________________________________________  Meat ________________________________________________  Fat _____________________________________________  Daily Totals  Starches _________________________  Vegetable __________________________  Fruit ______________________________  Dairy ______________________________  [employer]  ______________________________  Fat ________________________________   Document Released: 09/09/2005 Document Re-Released: 06/02/2010  ExitCare?? Patient Information ??2012 [place], [place].

## 2012-03-12 NOTE — Unmapped (Signed)
Subjective:      Patient ID: Melanie [name] is a 56 y.o. female, referred by Dr. [name] for newly diagnosed Type 2 Diabetes Mellitus associated with hypertension.  She had polyuria and a strong family history of diabetes on her father's side in her grandmother, uncle, and aunt.  She was started on Metformin.  She did not start the monitoring.    HPI     Diabetes History:    Diagnosis:  DM Type II  Date of Diagnosis:  02/2012    Glucose Monitoring:  No       Hypoglycemic Events: Not expected.    Dietary Management:  Here to establish a meal plan.    Exercise:  No activity  Wheelchair.  Has MS    Insulin Management:  No insulin.      Diabetic Eye Exam:  Date of Last Exam:  01/2012 she had a dilated eye exam, but was not diagnosed with diabetes at that time.  Physician:    Discussed        Associated Symptoms:  Patient reports the following symptoms:  anxiety, depression, numbness, polyuria and tingling, vision problems  Patient denies the following symptoms:  non healing wounds    Comments:  Without her glasses, she is legally blind.         The following portions of the patient's history were reviewed and updated as appropriate: allergies, current medications, past family history, past medical history, past social history, past surgical history and problem list.    Histories:     She has a past medical history of Asthma; Multiple sclerosis; Hypertension; and Diabetes mellitus.    She has past surgical history that includes Knee surgery; Appendectomy; and Hysterectomy.    Her family history includes Alcohol abuse in her father and other; Brain cancer in her other; COPD in her father; Cancer in her father, maternal grandmother, mother, and sister; Diabetes in her daughter, paternal aunt, paternal grandfather, and paternal uncle; Skin cancer in her other; Stomach cancer in her other; and Throat cancer in her other.    She reports that she has never smoked. She does not have any smokeless tobacco history on file.  She reports that she drinks alcohol. She reports that she does not use illicit drugs.      Review of Systems  ROS was reviewed with patient and scanned into EPIC.  She will discuss nondiabetes related concerns with her PCP.    Allergies:   Compazine    Medications:     Outpatient Encounter Prescriptions as of 03/08/2012   Medication Sig Dispense Refill   ??? albuterol (PROVENTIL HFA;VENTOLIN HFA) 90 mcg/actuation inhaler Inhale 2 puffs into the lungs. Every 4 to 6 hours as needed.        ??? baclofen (LIORESAL) 10 MG tablet Take 1 tablet (10 mg total) by mouth 3 times a day.  270 tablet  3   ??? blood sugar diagnostic Strp Test blood sugars up to twice a day  50 strip  6   ??? blood-glucose meter kit Use as instructed  1 each  0   ??? blood-glucose meter kit Use as instructed  1 each  0   ??? cholecalciferol, vitamin D3, 1000 units tablet Take 1,000 Units by mouth 2 times a day as needed.       ??? gabapentin (NEURONTIN) 300 MG capsule Take 1 capsule (300 mg total) by mouth 3 (three) times daily.  90 capsule  5   ??? ibuprofen (  ADVIL,MOTRIN) 800 MG tablet Take 800 mg by mouth every 8 hours as needed.       ??? inhalational spacing device (AEROCHAMBER) Spcr by Misc.(Non-Drug; Combo Route) route. AEROCHAMBER PLUS.  Use with MDI        ??? interferon beta-1a (REBIF) 44 mcg/0.5 mL injection Inject 0.5 mLs (44 mcg total) into the skin every Monday, Wednesday, and Friday.  0.5 mL  11   ??? lisinopril-hydrochlorothiazide (PRINZIDE,ZESTORETIC) 20-25 mg per tablet Take 1 tablet by mouth daily.  90 tablet  3   ??? metFORMIN (GLUCOPHAGE) 500 MG tablet Take 1 tablet (500 mg total) by mouth 2 times a day with meals.  60 tablet  6   ??? modafinil (PROVIGIL) 200 MG tablet Take 1 tablet (200 mg total) by mouth every morning.  30 tablet  5        Objective:       Blood pressure 130/90, pulse 82, height 5' 4 (1.626 m), weight 249 lb (112.946 kg).    Physical Exam   Constitutional: She is oriented to person, place, and time. She appears well-developed and  well-nourished.          Overweight  Arrived in a wheel chair.   HENT:   Head: Normocephalic and atraumatic.   Eyes: Conjunctivae are normal.   Neck: Normal range of motion. Neck supple.   Cardiovascular: Normal rate and regular rhythm.    Pulmonary/Chest: Effort normal and breath sounds normal.   Musculoskeletal: She exhibits no edema.        Unable to stand even for her weight.   Neurological: She is alert and oriented to person, place, and time.   Skin: Skin is warm and dry.   Psychiatric: She has a normal mood and affect. Her behavior is normal. Judgment and thought content normal.           Lab Review:   RENAL:    Lab Results   Component Value Date    NA 137 12/23/2010    K 3.9 12/23/2010    CL 99 12/23/2010    CO2 29 12/23/2010    BUN 10 12/23/2010    CREATININE 0.72 12/23/2010    GLUCOSE 112* 12/23/2010    PHOS 3.5 12/23/2010    ALBUMIN 4.5 03/06/2012    ALBUMIN 4.3 12/23/2010    CALCIUM 9.6 12/23/2010       LIVER:    Lab Results   Component Value Date    AST 74* 03/06/2012    ALT 89* 03/06/2012    BILITOT 0.3 03/06/2012    ALBUMIN 4.5 03/06/2012    ALBUMIN 4.3 12/23/2010    ALKPHOS 100 03/06/2012       DIABETES:    Lab Results   Component Value Date    GLUCOSE 112* 12/23/2010    CREATININE 0.72 12/23/2010    AST 74* 03/06/2012    ALT 89* 03/06/2012    BILITOT 0.3 03/06/2012    ALBUMIN 4.5 03/06/2012    ALBUMIN 4.3 12/23/2010    ALKPHOS 100 03/06/2012       THYROID:    Lab Results   Component Value Date    GLUCOSE 112* 12/23/2010    AST 74* 03/06/2012    ALT 89* 03/06/2012    BILITOT 0.3 03/06/2012    ALBUMIN 4.5 03/06/2012    ALBUMIN 4.3 12/23/2010    ALKPHOS 100 03/06/2012    TSH 3.440 03/06/2012    TSH 2.330 07/08/2010  Assessment:     Diabetes  DM Type II is under unknown  control.  She must start SBGM.  No A1c found.    No results found for this basename: HGBA1C         Plan:     Diabetes  1.  Insulin: none        Dr. Vanita Panda has her on Metformin.500 mg twice a day.  2. Health Maintenance Review:   Last Eye  Exam:   2.2013   Last Podiatry Exam:   Do next visit.    Lipids:  Needs one.   No results found for this basename: LIPIDCOMM, CHOLTOT, TRIG, HDL, CHOLHDL, VLDCHOL, LDL        Microalbumin/Creatinine Ratio:   Needs one   No results found for this basename: MICROALBUR, [id number]         2.  Education:  Reviewed ???ABCs??? of diabetes management (respective goals in parentheses):  A1C (<7), blood pressure (<130/80), and cholesterol (LDL <100).   Additional Education:   interpretation of lab results, blood sugar goals, complications of diabetes mellitus, illness management, self-monitoring of blood glucose skills, nutrition and carbohydrate counting  3.  Compliance at present is estimated to be inadequate. Efforts to improve compliance (if necessary) will be directed at dietary modifications: 30-45 g of carb three times a day., increased exercise and regular blood sugar monitoring: 1-2 times daily.  4.  Follow up: I recommend diabetes care be 1 month.           Medical Decision Making:  The following items were considered in medical decision making:  Permanent chart problem/surgery list reviewed  Permanent chart chronic med/allergy list reviewed  Permanent chart social/family history reviewed  PFSH and Health screening (pt reported) reviewed  Review / order clinical lab tests      DIABETES EDUCATION VISIT - Individual  Visit Type:  Initial  Self Assessment Completed:   Yes    Referring Physician:  [name], MD  Chief Complaint:  Diabetes Type 2  Race:  Black or African American  Special Needs:  Physical challange with wheel chair  English 2nd Language:  No  Features to overcome needs: Good building accomadations for wheel chair    Self Blood Glucose Monitoring:  no  Hypoglycemia per week:  not expected  Carries Carbohydrates:  No  Follows Food Plan:  here to establish a meal plan.  Ketone Testing:  No  Exercise Program:  No    EDUCATION  Diabetes disease process and treatment (Define diabetes and identify own type of  diabetes and treatment options (food choices, activity, and medications):    Assessment:  1 - Needs instruction Instructed:  3 - Comprehends key points  Comments:  Covered basics of Type 2 DM and insulin resistance.      Incorporating healthy food choices and carb counting into lifestyle:    Assessment:  1 - Needs instruction Instructed:  3 - Comprehends key points  Comments:  Instructed in basics of carb counting and meal planning for weight loss.  Introduction into carb counting and meal planning with 30-45 g of carb per meal three times a day.  Eat more vegetables.      Benefits of physical activity in diabetes control (State effect of exercise on blood glucose levels):    Assessment:  1 - Needs instruction Instructed:  3 - Comprehends key points  Comments:  Limited.  Try chair exercises.    Using diabetes medications safely:    Assessment:  1 - Needs instruction Instructed:  3 - Comprehends key points  Comments:  Discussed action of Metformin and take with meals.    Monitoring blood glucose, interpreting and using results (Identify recommended pre/post meal targets):    Assessment:  1 - Needs instruction Instructed:  3 - Comprehends key points  Comments:  Instructed in use of [employer]k Nano monitor and Fastclix lancet device.  She was not able to use the one at home and did not bring it.  I called her pharmacy.  She has a Armed forces technical officer.  She can either try to get Accuchek Smartview strips and fastclix lancets or transition to the other meter now that she has some practice with blood testing technique.  Goals given.    Prevention, detection, and treatment of acute complications (List symptoms and treatment of hyper- and hypoglycemia):    Assessment:  1 - Needs instruction Instructed:  3 - Comprehends key points  Comments:  Discussed symptoms of high sugars and some sick day rules.  Lows are not expected.    Prevention, detection, and treatment of chronic complications (Define the natural course of diabetes and  describe the relationship of blood glucose levels to long term complications of diabetes):    Assessment:  1 - Needs instruction Instructed:  3 - Comprehends key points  Comments:  Discussed the progressive nature of T2DM.  Discussed protective foot care.  Encouraged good dental care.      Living with diabetes (Describe feelings about diabetes; identify support system):    Assessment:  1 - Needs instruction Instructed:  3 - Comprehends key points  Comments:  Support plan printed.  Diabetes text provided.  Carb book given.  Join ADA.    Health promotion and changing behavior Identify appropriate screening):    Assessment:  1 - Needs instruction Instructed:  3 - Comprehends key points  Comments:  Screening list given.      HANDOUTS GIVEN TODAY  Healthy Eating  Carb Counting - Meals 30-45 grams; Snacks 15 grams  Monitoring    GOALS  Healthy Eating - Plan meals with 30-45 g of carb three times a day and keep a food diary for next visit.  Monitoring - Test 1-2 times a day at premeals and bedtime.  Keep a log and bring in meter and log.    TIME SPENT WITH PATIENT:  Time spent with patient:  60 minutes  Time spent discussing diagnosis, management, and treatment plan:  > 35 minutes  Details:

## 2012-03-13 NOTE — Unmapped (Signed)
Patient advised referral was sent into Dr. Cherre Huger this am  Number given to patient

## 2012-03-13 NOTE — Unmapped (Signed)
Patient says that the dr told her to see a neuro psychiatrist and she needs a referral and wants to know who she should see. Also needs a referral for Urologist.

## 2012-03-13 NOTE — Unmapped (Signed)
Patient says that the dr told her to see a neuro psychiatrist and she needs a referral and wants to know who she should see. Also needs a referral for Urologist.

## 2012-03-14 NOTE — Unmapped (Signed)
Order for aqua therapy- and  Needs a letter for LTD -call to advise

## 2012-03-14 NOTE — Unmapped (Signed)
Pt went to nutritionist and nutritionist wants a diff brand of med strips, she had [employer] brand and she switched her to [employer]. She needs a new script for it.    [employer]  [phone]

## 2012-03-14 NOTE — Unmapped (Signed)
Advised patient will have to get her med strip for her DM through her PCP

## 2012-03-15 NOTE — Unmapped (Signed)
Spoke to pt and letter for starting LTD on March 27, 2012 was faxed to [employer] @ [phone].

## 2012-03-16 ENCOUNTER — Ambulatory Visit: Admit: 2012-03-16 | Discharge: 2012-03-16 | Payer: PRIVATE HEALTH INSURANCE

## 2012-03-16 ENCOUNTER — Ambulatory Visit: Payer: PRIVATE HEALTH INSURANCE

## 2012-03-16 DIAGNOSIS — E119 Type 2 diabetes mellitus without complications: Secondary | ICD-10-CM

## 2012-03-16 DIAGNOSIS — N3281 Overactive bladder: Secondary | ICD-10-CM

## 2012-03-16 MED ORDER — oxybutynin (DITROPAN-XL) 10 MG 24 hr tablet
10 | ORAL_TABLET | Freq: Every day | ORAL | Status: AC
Start: 2012-03-16 — End: 2012-05-04

## 2012-03-16 MED ORDER — estradiol (VAGIFEM) 10 mcg Tab
10 | ORAL_TABLET | VAGINAL | 1.00 refills | 30.00000 days | Status: AC
Start: 2012-03-16 — End: 2013-02-27

## 2012-03-16 MED ORDER — LORazepam (ATIVAN) 1 MG tablet
1 | ORAL_TABLET | ORAL | Status: AC | PRN
Start: 2012-03-16 — End: 2013-02-27

## 2012-03-16 NOTE — Unmapped (Signed)
Subjective Diabetes care, fasting labs  HPI:   Patient ID: Melanie [name] is a 56 y.o. female.    Chief Complaint:  HPI Comments: Patient has returned for her DM management. Has seen the dietician,nurse educator and is seemingly more motivated to work on her diabetes. She's made changes in her diet already and her BS have come down from 200+ to 140's 150's. She's been absent from her care here for over a year almost 2 due she says to her MS and that care she states. She is still not back to work. She's lost weight about 10 lbs already and is feeling better.    Diabetes                    ROS:   Review of Systems       Objective:   Physical Exam   Constitutional: She is oriented to person, place, and time.        Obese   Eyes: Conjunctivae are normal. Pupils are equal, round, and reactive to light.   Neck: No thyromegaly present.   Cardiovascular: Normal rate, regular rhythm, normal heart sounds and intact distal pulses.    No murmur heard.       No bruits, 2+ carotid pulses   Pulmonary/Chest: Effort normal and breath sounds normal.   Lymphadenopathy:     She has no cervical adenopathy.   Neurological: She is alert and oriented to person, place, and time.        Normal monofilament   Psychiatric:        Affect normal in good spirits. Little less scattered.             Filed Vitals:    03/16/12 0910   BP: 132/88   Height: 5' 3.75 (1.619 m)   Weight: 238 lb (107.956 kg)     Body mass index is 41.17 kg/(m^2).  Body surface area is 2.20 meters squared.                Assessment/Plan:     Diabetes mellitus-Check all fasting labs today. Continue Diabetic education and she's working on losing weight   BP at goal. Will start Statin after labs available  Total spent face to face with patient

## 2012-03-16 NOTE — Unmapped (Signed)
Subjective:      Patient ID: Melanie [name] is a 56 y.o. female.    HPI 56 y.o. black female referred by Dr. Grayland Jack for urinary frequency and urgency.  She has found it has worsened over the past month.  She also has a diagnosis of multiple sclerosis.  She also feels that she also has post-void dribbling.  She has not used pads yet but she does have some soiling of her clothing.  She has occasional stress urinary incontinence but that is only occasional.  She has a history of a hysterectomy for prolapse.  She has no symptoms of prolapse.  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    Urinary Incontinence:    56 y.o.     Menopausal Status:  Post    Incontinence History:  Duration: 1 months  Limits Quality of Life:  yes  Urodynamics:  no  Prior UTI's:   A couple in her lifetime  Hematuria:   no  Kidney Stones:   no  Number of nocturia episodes (in 24 hour period):  2-3  Number of voids  (in 24 hour period):  q3-4h  Number of incontinence episodes  (in 24 hour period):  Whenever she gets up to go to the bathroom  Hesitancy:   no  Urgency:  yes  Need for pads:    no   # Used per day:    Occurs:  both day and night      Prior Treatments:  Kegels:   no  Bio-Feedback:   no  E-stim:  no  Collagen:   no  Anti-Incontinence Procedure:   no    Previous Medications Tried:  None    History:  Multiple Sclerosis Diagnosis       The following portions of the patient's history were reviewed and updated as appropriate: allergies, current medications, past family history, past medical history, past social history, past surgical history and problem list.  Review of Systems  Review of Systems - Negative except what was noted in the HPI for a balance of ten systems.      Objective:   Physical Exam  General Appearance:  Alert, cooperative, no distress, appears stated age  Head: Normocephalic, without obvious abnormality, atraumatic  Eyes: PERRL, conjunctiva/corneas clear, EOM's intact,  fundi     benign, both eyes  Ears:    Normal TM's and external ear canals, both ears  Nose:   Nares normal, septum midline, mucosa normal, no drainage     or sinus tenderness  Throat:   Lips, mucosa, and tongue normal; teeth and gums normal  Neck:   Supple, symmetrical, trachea midline, no adenopathy;     thyroid:  no enlargement/tenderness/nodules; no carotid    bruit or JVD  Back:     Symmetric, no curvature, ROM normal, no CVA tenderness  Lungs:  respirations unlabored  Chest Wall:  No tenderness or deformity  Abdomen:  Soft, non-tender, bowel sounds active all four quadrants,   no masses, no organomegaly  Extremities:  Extremities normal, atraumatic, no cyanosis or edema  Pulses: 2+ and symmetric all extremities  Skin:  Skin color, texture, turgor normal, no rashes or lesions  Lymph nodes:   Cervical, supraclavicular, and axillary nodes normal  Neurologic:  CNII-XII intact, normal strength, sensation and reflexes throughout     Pelvic exam: VULVA: normal appearing vulva with no masses, tenderness or lesions, VAGINA: atrophic, CERVIX: surgically absent. significant tenderness on exam  due to atrophic vaginitis.  Unable to do a complete speculum exam due to patient discomfort.  She had 2 by 1 cm skin tag protruding from her left buttox that we decided to remove.    UROGYN Physical Exam:  Stress Test:  Negative   Stress Test with Barrier Reduction: n/a  PVR (ml):  10 cc      Prolapse:   none       Assessment:     1. Overactive bladder  oxybutynin (DITROPAN-XL) 10 MG 24 hr tablet   2. Vaginitis, atrophic  estradiol (VAGIFEM) 10 mcg Tab   3. Skin tag  Tissue Pathology      We discussed the patient's options which include expectant management, behavioral modification, anticholinergic medication, pelvic floor rehabilitation or a combination of these treatment modalities.  We performed an in-and-out catheterization with an 43 French straight cathether to check a post-void residual to rule out voiding dysfunction given  her symptoms.  The postvoid residual was normal.     Plan:   She will follow-up in one month.  I would like to repeat her pelvic exam once she has some estrogen on board.       Approximately 80 minutes was spent with this patient, over 45 minutes in face-to-face consultation.

## 2012-03-16 NOTE — Unmapped (Signed)
If burning with urination, you can purchase pyridium over the counter.

## 2012-03-17 DIAGNOSIS — N3281 Overactive bladder: Secondary | ICD-10-CM

## 2012-03-17 NOTE — Unmapped (Signed)
PROCEDURE NOTE    PRE-OP DIAGNOSIS:  skin tag    PROCEDURE:  Skin Lesion Excision(s)    INDICATIONS:  Melanie [name] is a 56 y.o. female who presents for minor skin surgery.  The patient understands all risks, benefits, indications, potential complications, and alternatives, and freely consents for the procedure.  The patient also understands the option of performing no surgery, the risk for scarring, and the technique of the procedure.    ANESTHESIA:  Local.    TECHNIQUE:  After informed consent was obtained, and after the skin was prepped and draped, 1% lidocaine without epinephrine for anesthetic was injected around and underneath the site.   A scalpel was used to remove the skin tag at it's stalk. Hemostasis was achieved with pressure and silver nitrate and wound care instructions were provided.  Melanie [name] tolerated the procedure well and without complications.  The patient will be alert for any signs of cutaneous infection and will follow up as instructed.

## 2012-03-27 ENCOUNTER — Inpatient Hospital Stay: Admit: 2012-03-27 | Payer: PRIVATE HEALTH INSURANCE | Attending: Neurology

## 2012-03-27 DIAGNOSIS — G35 Multiple sclerosis: Secondary | ICD-10-CM

## 2012-03-27 MED ORDER — GADAVISTgadobutrolSoln11mL
1 | Freq: Once | INTRAVENOUS | Status: AC | PRN
Start: 2012-03-27 — End: 2012-03-28

## 2012-03-27 MED FILL — GADOBUTROL 1 MMOL/ML (604.72 MG/ML) INTRAVENOUS SOLUTION: 1 1 mmol/mL (604.72 mg/mL) | INTRAVENOUS | Qty: 11

## 2012-03-28 ENCOUNTER — Inpatient Hospital Stay: Admit: 2012-03-28 | Payer: PRIVATE HEALTH INSURANCE

## 2012-03-28 DIAGNOSIS — E119 Type 2 diabetes mellitus without complications: Secondary | ICD-10-CM

## 2012-03-28 LAB — MICROALBUMIN (RANDOM UR)
Creatinine, Urine: 283.3 mg/dL
Microalb / UCreat: 22.6 mg/g (ref 0.0–30.0)
Microalb, Ur: 64 ug/mL (ref 0.0–17.0)

## 2012-03-28 LAB — HEMOGLOBIN A1C: Hemoglobin A1C: 8.4 % — ABNORMAL HIGH (ref 4.8–6.4)

## 2012-03-29 ENCOUNTER — Ambulatory Visit: Admit: 2012-03-29

## 2012-03-29 NOTE — Unmapped (Signed)
Spoke with patient A1c 8.4 now she's on meds and working on diet and with educator. I expect it to improve. She neds to go back and get lipids and renal. The lab did not draw that. It was missed. she'llgo tomorrow.

## 2012-03-30 ENCOUNTER — Inpatient Hospital Stay: Admit: 2012-03-30 | Payer: PRIVATE HEALTH INSURANCE

## 2012-03-30 DIAGNOSIS — E119 Type 2 diabetes mellitus without complications: Secondary | ICD-10-CM

## 2012-03-30 LAB — LIPID PANEL
Cholesterol, Total: 205 mg/dL (ref 0–200)
HDL: 68 mg/dL (ref 30–60)
LDL Cholesterol: 122 mg/dL (ref 0–160)
Triglycerides: 76 mg/dL (ref 10–150)

## 2012-03-30 LAB — HEMOGLOBIN A1C: Hemoglobin A1C: 8.7 % (ref 4.8–6.4)

## 2012-03-30 LAB — RENAL FUNCTION PANEL W/EGFR
1/Creatinine: 1.64 ratio
Albumin: 4.2 g/dL (ref 3.6–5.1)
Anion Gap: 13 mmol/L (ref 3–16)
BUN: 12 mg/dL (ref 7–25)
CO2: 29 mmol/L (ref 21–33)
Calcium: 9.4 mg/dL (ref 8.6–10.2)
Chloride: 100 mmol/L (ref 98–110)
Creatinine: 0.61 mg/dL (ref 0.50–1.20)
GFR MDRD Af Amer: 123 See note.
GFR MDRD Non Af Amer: 102 See note.
Glucose: 99 mg/dL (ref 65–99)
Osmolality, Calculated: 294 mOsm/kg (ref 278–305)
Phosphorus: 3.9 mg/dL (ref 2.5–4.5)
Potassium: 4.2 mmol/L (ref 3.5–5.3)
Sodium: 142 mmol/L (ref 135–146)

## 2012-04-05 ENCOUNTER — Ambulatory Visit: Admit: 2012-04-05

## 2012-04-10 ENCOUNTER — Ambulatory Visit: Admit: 2012-04-10

## 2012-04-10 ENCOUNTER — Ambulatory Visit: Payer: PRIVATE HEALTH INSURANCE

## 2012-04-11 NOTE — Unmapped (Signed)
[facility]

## 2012-04-13 MED ORDER — atorvastatin (LIPITOR) 10 MG tablet
10 | ORAL_TABLET | Freq: Every day | ORAL | Status: AC
Start: 2012-04-13 — End: 2012-04-13

## 2012-04-13 MED ORDER — atorvastatin (LIPITOR) 10 MG tablet
10 | ORAL_TABLET | Freq: Every day | ORAL | Status: AC
Start: 2012-04-13 — End: 2014-04-09

## 2012-04-13 NOTE — Unmapped (Signed)
Reviewed her labs. In detail explained what each means and why diabetics need to lower LDL. We rec starting statins for all diabetics.  Medications that lower your cholesterol do have side effects that you need to be aware of. These include muscle aching, weakness or fatigue. If you experience any of these, please call your doctor. These could be a sign of a more serious problem such as myopathy or myositis(muscle inflammation) or  rhabdomyolosis in which the muscle breaks down and could lead to kidney failure or even death. It is rare, but it is important that you be aware that side effects can occur anytime.   she has f/u appt in June we'll retest then.    Sent script to [facility]. Also she wants letter for [employer] stating she has MS, DM . She's applying for discount to use their therapy services.

## 2012-04-13 NOTE — Unmapped (Signed)
Returning call from last week- please call.

## 2012-04-17 NOTE — Unmapped (Signed)
Called patient.  C/o vagifem being too expensive, and would like something less expensive.  Also, pt.would like to talk to you about her dx and tx options that can get her back to her normal life.

## 2012-04-17 NOTE — Unmapped (Signed)
Patient would like to speak with Dr. Evlyn Kanner regarding the expense of medication that was prescribed for her.

## 2012-04-17 NOTE — Unmapped (Signed)
The patient was supposed to follow-up with me on Thursday and did not come to her appointment.  Have her reschedule an appointment so we can discuss other options.  Did she try the overactive bladder medicaiton (oxybutynin) I prescribed for her?  She can call her insurance company and ask them what alternative for vaginal estrogen would be cheaper (estrace cream, premarin cream, estring?).

## 2012-04-20 NOTE — Unmapped (Signed)
Pt is needing to inform you on her condition.

## 2012-04-20 NOTE — Unmapped (Signed)
Patient states that her daughter was in a car accident and is in ICU at hospital.  Patient states that she cant remembered why she was calling nut will call back to office if she needs anything

## 2012-04-24 NOTE — Unmapped (Signed)
Patient requesting the Rebidose Rebif order faxed

## 2012-04-24 NOTE — Unmapped (Signed)
Pt is wanting to try a new medication, not sure of the name but is for ms-rebif? Please call to advise.

## 2012-04-26 ENCOUNTER — Ambulatory Visit: Admit: 2012-04-26

## 2012-04-27 NOTE — Unmapped (Signed)
Spoke with patient she said Dr D gave her names and numbers for a dietician and social worker at last visit but she lost them. I will have to check with her. I dont see specific names in office note

## 2012-04-27 NOTE — Unmapped (Signed)
Pt needs a referral for a  dietician and Child psychotherapist. She said this information was given to her before but she lost it

## 2012-04-28 ENCOUNTER — Encounter

## 2012-04-28 NOTE — Unmapped (Signed)
Spoke with patient she found the numbers

## 2012-04-28 NOTE — Unmapped (Signed)
Look under referrals. She was referred to Diabetes education. That would be Melanie [name]. She should have had appt made if not send to [facility] for appt or give # for patient to call and set up. I didn't give # or name of SW. That may have come from Dr. Grayland Jack.

## 2012-05-01 NOTE — Unmapped (Signed)
Pt is on her way over here to get a med rec release form signed by Dr Grayland Jack so that she can get disability - I told her Dr Grayland Jack was seeing her last pt and I had no way of knowing if dr is ahead in her schedule and will still be here or not.

## 2012-05-04 ENCOUNTER — Ambulatory Visit: Admit: 2012-05-04 | Discharge: 2012-05-04 | Payer: PRIVATE HEALTH INSURANCE

## 2012-05-04 DIAGNOSIS — N3281 Overactive bladder: Secondary | ICD-10-CM

## 2012-05-04 MED ORDER — oxybutynin (DITROPAN-XL) 10 MG 24 hr tablet
10 | ORAL_TABLET | ORAL | Status: AC
Start: 2012-05-04 — End: 2014-04-09

## 2012-05-04 NOTE — Unmapped (Signed)
CC: OAB    HPI: 56 yo AAF presents for f/u of OAB symptoms after starting oxybutinin XL. Patient reports symptoms are much improved. Reports leaking twice weekly now. Patient still feels urgency at times but is able to get to the bathroom in time now. Feels like she occasionally does not empty completely. Denies side effects from medication. Patient works in [place] and cannot always make it to the bathroom in time because it is difficult for her to be relieved to go to the bathroom. Pt is currently filing for disability.  Patient filled vaginal cream (filled 1/2 the script due to cost) but never used the med because she didn't want to start a med she couldn't continue to use.  Site where skin tag was removed healed without problems    Review of Systems -   General ROS: negative  Psychological ROS: negative  Respiratory ROS: no cough, shortness of breath, or wheezing  Cardiovascular ROS: no chest pain or dyspnea on exertion  Gastrointestinal ROS: no abdominal pain, change in bowel habits, or black or bloody stools  Genito-Urinary ROS: see HPI  Dermatological ROS: negative    Filed Vitals:    05/04/12 1529   BP: 111/75   Pulse: 76     NAD, morbidly obese  A&Ox3  No respiratory distress  No peripheral edema      Pathology:  FINAL DIAGNOSIS:    A. 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

## 2012-05-05 NOTE — Unmapped (Signed)
DM ed Melanie [name] [phone]  6-4 @ 8:45  Pt said she would call me back but I also mailed info

## 2012-05-08 NOTE — Unmapped (Signed)
I saw and examined the patient.  I discussed with the resident or fellow and agree with findings and plan as documented in the note.    Melanie Seeman M Lashina Milles, MD     Approximately 10 minutes was spent with this patient, over 6 minutes in face-to-face consultation.

## 2012-05-16 NOTE — Unmapped (Signed)
Patient thought Dr D assigned her a Child psychotherapist for her disability and she wanted the name and number again. I told her Dr D did not. She said it must have been her ms doc, she will call them

## 2012-05-16 NOTE — Unmapped (Signed)
Requesting the name and number  of the social worker that was assigned to her.  Please call to inform.

## 2012-05-17 NOTE — Unmapped (Signed)
Pt is going out on disability. She got the name of a Child psychotherapist from [place] and she can not remember what it is.

## 2012-05-17 NOTE — Unmapped (Signed)
Number given to Marchelle Folks with the MS society [phone]

## 2012-05-24 ENCOUNTER — Ambulatory Visit: Admit: 2012-05-24

## 2012-05-31 ENCOUNTER — Ambulatory Visit: Admit: 2012-05-31 | Discharge: 2012-05-31 | Payer: PRIVATE HEALTH INSURANCE

## 2012-05-31 DIAGNOSIS — E119 Type 2 diabetes mellitus without complications: Secondary | ICD-10-CM

## 2012-05-31 NOTE — Unmapped (Signed)
Subjective:      Patient ID: Melanie [name] is a 56 y.o. female, referred by Dr. [name] for newly diagnosed Type 2 Diabetes Mellitus associated with hypertension.  She had polyuria and a strong family history of diabetes on her father's side in her grandmother, uncle, and aunt.  She was started on Metformin and she is tolerating it.  Dr. Vanita Panda gave her Lipitor and she did not tolerate it.  It caused her to get chest pain on 2 occassions and she stopped it.  I encouraged her to let her doctor know and I stressed low cholesterol choices today.  She is losing weight, but had a set back to her own care as she is helping her daughter after a bad auto accident.  She was in the ICU for 5 days a month ago.  She brought in her Nano meter for download.  Her average bg is 141 +/- 7.  There are only 5 tests.  She lost the first meter I gave her.  Weight is down 10 pounds since last visit.    Wt Readings from Last 3 Encounters:   05/31/12 228 lb (103.42 kg)   03/27/12 240 lb (108.863 kg)   03/16/12 238 lb (107.956 kg)     BP Readings from Last 3 Encounters:   05/31/12 110/70   05/04/12 111/75   03/16/12 142/86     Lab Results   Component Value Date    HGBA1C 8.7* 03/30/2012           HPI     Diabetes History:    Diagnosis:  DM Type II  Date of Diagnosis:  02/2012    Glucose Monitoring:  Yes   Glucose Testing Frequency:  fasting and before meals  Test Site:  fingerstick    Home Monitoring Reviewed:  meter download and see scanned log    Breakfast: Before:  130-140 After:    Lunch: Before:   After:    Dinner: Before:   After:    Bedtime: 140  2 am - 3 am:       Hypoglycemic Events:  Frequency:  Not expected      Dietary Management:  restricts carbohydrates and carb counting    Exercise:  Active Lifestyle  water exercises 2 times a week. plus walking.        Diabetic Eye Exam:  Date of Last Exam:  01/2012      Podiatry Exam:  Frequency:  annual and prn  Last Exam Date:  today  Physician:  Nurse:  Stacie Glaze     Dental  Exam:  Last Dental Exam Date:  >2 years and we discussed good dental care.    Associated Symptoms:  Patient reports the following symptoms:  anxiety, depression, numbness, tingling and vision problems  Patient denies the following symptoms:  polydipsia and polyuria    Comments:           The following portions of the patient's history were reviewed and updated as appropriate: allergies, current medications, past family history, past medical history, past social history, past surgical history and problem list.    Histories:     She has a past medical history of Asthma; Multiple sclerosis; Hypertension; and Diabetes mellitus.    She has past surgical history that includes Knee surgery; Appendectomy; and Hysterectomy.    Her family history includes Alcohol abuse in her father and other; Brain cancer in her other; COPD in her father; Cancer in her father, maternal grandmother,  mother, and sister; Diabetes in her daughter, paternal aunt, paternal grandfather, and paternal uncle; Skin cancer in her other; Stomach cancer in her other; and Throat cancer in her other.    She reports that she has never smoked. She has never used smokeless tobacco. She reports that she drinks alcohol. She reports that she does not use illicit drugs.      Review of Systems   Constitutional: Positive for fatigue.        Lost 10 pounds, but she was trying very hard.   Musculoskeletal: Positive for myalgias, joint swelling and arthralgias.   Neurological: Positive for numbness.   Psychiatric/Behavioral: Positive for sleep disturbance and dysphoric mood.       Allergies:   Compazine    Medications:     Outpatient Encounter Prescriptions as of 05/31/2012   Medication Sig Dispense Refill   ??? albuterol (PROVENTIL HFA;VENTOLIN HFA) 90 mcg/actuation inhaler Inhale 2 puffs into the lungs. Every 4 to 6 hours as needed.        ??? baclofen (LIORESAL) 10 MG tablet Take 1 tablet (10 mg total) by mouth 3 times a day.  270 tablet  3   ??? blood sugar diagnostic Strp  Test blood sugars up to twice a day  50 strip  6   ??? blood-glucose meter kit Use as instructed  1 each  0   ??? cholecalciferol, vitamin D3, 1000 units tablet Take 1,000 Units by mouth 2 times a day as needed.       ??? gabapentin (NEURONTIN) 300 MG capsule Take 1 capsule (300 mg total) by mouth 3 (three) times daily.  90 capsule  5   ??? inhalational spacing device (AEROCHAMBER) Spcr by Misc.(Non-Drug; Combo Route) route. AEROCHAMBER PLUS.  Use with MDI        ??? interferon beta-1a (REBIF) 44 mcg/0.5 mL injection Inject 0.5 mLs (44 mcg total) into the skin every Monday, Wednesday, and Friday.  0.5 mL  11   ??? lisinopril-hydrochlorothiazide (PRINZIDE,ZESTORETIC) 20-25 mg per tablet Take 1 tablet by mouth daily.  90 tablet  3   ??? metFORMIN (GLUCOPHAGE) 500 MG tablet Take 1 tablet (500 mg total) by mouth 2 times a day with meals.  60 tablet  6   ??? modafinil (PROVIGIL) 200 MG tablet Take 1 tablet (200 mg total) by mouth every morning.  30 tablet  5   ??? oxybutynin (DITROPAN-XL) 10 MG 24 hr tablet Take two tablets by mouth daily  60 tablet  12   ??? atorvastatin (LIPITOR) 10 MG tablet Take 1 tablet (10 mg total) by mouth daily.  30 tablet  11   ??? blood-glucose meter kit Use as instructed  1 each  0   ??? estradiol (VAGIFEM) 10 mcg Tab Use one tablet daily for two weeks and then twice per week after that.  30 tablet  12   ??? ibuprofen (ADVIL,MOTRIN) 800 MG tablet Take 800 mg by mouth every 8 hours as needed.       ??? LORazepam (ATIVAN) 1 MG tablet Take 1 tablet (1 mg total) by mouth as needed. 1 po 1 hour before MRI then 1 right before scan  2 tablet  0        Objective:       Blood pressure 110/70, pulse 76, height 5' 3.75 (1.619 m), weight 228 lb (103.42 kg).    Physical Exam   Constitutional: She is oriented to person, place, and time. She appears well-developed and well-nourished.   HENT:  Head: Normocephalic and atraumatic.        Hearing is grossly intact.   Eyes: Conjunctivae normal are normal.   Neck: Normal range of motion.  Neck supple.   Cardiovascular: Normal rate and regular rhythm.    Pulmonary/Chest: Effort normal and breath sounds normal.   Musculoskeletal: Normal range of motion. She exhibits no edema.   Neurological: She is alert and oriented to person, place, and time.   Skin: Skin is warm and dry. No rash noted.   Psychiatric: She has a normal mood and affect. Her behavior is normal. Judgment and thought content normal.       Foot Exam:  Skeletal:  no abnormalities  Pedal Pulses:  dorsalis pedis pulses 2+ symmetric and posterior tibial pulses 2+ symmetric  Peripheral Circulation:  no cyanosis, clubbing or edema; well perfused, no ulceration  Integument:  no skin or nail lesions; no thickening, discoloration or pitting  She is wearing dark nail polish.  Neurological:  monofilament and vibratory sensation intact bilaterally and normal Achiles reflex bilaterally      Lab Review:   RENAL:    Lab Results   Component Value Date    NA 142 03/30/2012    K 4.2 03/30/2012    CL 100 03/30/2012    CO2 29 03/30/2012    BUN 12 03/30/2012    CREATININE 0.61 03/30/2012    GLUCOSE 99 03/30/2012    PHOS 3.9 03/30/2012    ALBUMIN 4.2 03/30/2012    ALBUMIN 4.3 12/23/2010    CALCIUM 9.4 03/30/2012       LIVER:    Lab Results   Component Value Date    AST 74* 03/06/2012    ALT 89* 03/06/2012    BILITOT 0.3 03/06/2012    ALBUMIN 4.2 03/30/2012    ALBUMIN 4.3 12/23/2010    ALKPHOS 100 03/06/2012       DIABETES:    Lab Results   Component Value Date    HGBA1C 8.7* 03/30/2012    CHOLTOT 205* 03/30/2012    HDL 68* 03/30/2012    TRIG 76 03/30/2012    GLUCOSE 99 03/30/2012    CREATININE 0.61 03/30/2012    AST 74* 03/06/2012    ALT 89* 03/06/2012    BILITOT 0.3 03/06/2012    ALBUMIN 4.2 03/30/2012    ALBUMIN 4.3 12/23/2010    ALKPHOS 100 03/06/2012       THYROID:    Lab Results   Component Value Date    CHOLTOT 205* 03/30/2012    HDL 68* 03/30/2012    TRIG 76 03/30/2012    GLUCOSE 99 03/30/2012    AST 74* 03/06/2012    ALT 89* 03/06/2012    BILITOT 0.3 03/06/2012    ALBUMIN 4.2 03/30/2012    ALBUMIN 4.3  12/23/2010    ALKPHOS 100 03/06/2012    TSH 3.440 03/06/2012    TSH 2.330 07/08/2010              Assessment:     Diabetes  DM Type II is under fair control.  Will try to get the FBS back to 110-120.    Plan:     Diabetes  1.  Insulin:    Basal Insulin:  none   She is on Metformin 500 mg twice a day from Dr. Vanita Panda  2. Health Maintenance Review:   Last Eye Exam:   01/2012   Last Podiatry Exam:  today   Last Dental Exam:   >2 years   Lipids:  normal   Microalbumin/Creatinine Ratio:   normal ratio was 22.6 in April 2013      2.  Education:  Reviewed ???ABCs??? of diabetes management (respective goals in parentheses):  A1C (<7), blood pressure (<130/80), and cholesterol (LDL <100).   Additional Education:   interpretation of lab results, blood sugar goals, complications of diabetes mellitus, exercise, self-monitoring of blood glucose skills, nutrition and carbohydrate counting  3.  Compliance at present is estimated to be fair. Efforts to improve compliance (if necessary) will be directed at dietary modifications: 45 g of carb three times a day. and regular blood sugar monitoring: 2 times daily.  4.  Follow up: I recommend diabetes care be one year.           Medical Decision Making:  The following items were considered in medical decision making:  Permanent chart problem/surgery list reviewed  Permanent chart chronic med/allergy list reviewed  Permanent chart social/family history reviewed  PFSH and Health screening (pt reported) reviewed  Review / order clinical lab tests  DIABETES EDUCATION VISIT - Individual  Visit Type:  Return  Self Assessment Completed:   Yes    Referring Physician:  [name], MD  Chief Complaint:  Diabetes Type 2  Race:  Black or African American  Special Needs:  none  English 2nd Language:  No  Features to overcome needs: na    Self Blood Glucose Monitoring:  yes; Frequency:  fasting, before meals and bedtime  Hypoglycemia per week:  not expected  Carries Carbohydrates:  No not  needed.  Follows Food Plan:  > 75%  Ketone Testing:  No  Exercise Program:  Yes      EDUCATION  Diabetes disease process and treatment (Define diabetes and identify own type of diabetes and treatment options (food choices, activity, and medications):    Assessment:  1 - Needs instruction Instructed:  3 - Comprehends key points  Comments:  Covered basics of Type 2 DM and insulin resistance.      Incorporating healthy food choices and carb counting into lifestyle:    Assessment:  1 - Needs instruction Instructed:  3 - Comprehends key points  Comments:  Reviewed basic carb counting and weight loss tips.  She is doing a good job losing weight.  BMI is high risk and she requires a long term commitment to weight loss and exercise.    Benefits of physical activity in diabetes control (State effect of exercise on blood glucose levels):    Assessment:  1 - Needs instruction Instructed:  3 - Comprehends key points  Comments:  She is active everyday.  Good job.    Using diabetes medications safely:    Assessment:  1 - Needs instruction Instructed:  3 - Comprehends key points  Comments:  Discussed the importance of Metformin.    Monitoring blood glucose, interpreting and using results (Identify recommended pre/post meal targets):    Assessment:  1 - Needs instruction Instructed:  3 - Comprehends key points  Comments:  Doing a good job.  Average bg is 141.  She has targets and resumed testing after losing her meter.  We discussed the importance of keeping up with monitoring.  This is biofeedback on your last meal.    Prevention, detection, and treatment of acute complications (List symptoms and treatment of hyper- and hypoglycemia):    Assessment:  1 - Needs instruction Instructed:  3 - Comprehends key points  Comments:  Discussed sick day rules and high sugars.  Prevention, detection, and treatment of chronic complications (Define the natural course of diabetes and describe the relationship of blood glucose levels to long  term complications of diabetes):    Assessment:  1 - Needs instruction Instructed:  3 - Comprehends key points  Comments:  Discussed the progressive nature of T2DM.  Discussed protective foot care.  Encouraged good dental care.      Living with diabetes (Describe feelings about diabetes; identify support system):    Assessment:  1 - Needs instruction Instructed:  3 - Comprehends key points  Comments:      Feeling hopeful.     Health promotion and changing behavior Identify appropriate screening):    Assessment:  1 - Needs instruction Instructed:  3 - Comprehends key points  Comments:  Eye exam is up to date.      HANDOUTS GIVEN TODAY  Carb Counting - Meals 30-45 grams; Snacks 15 grams    GOALS  Healthy Eating - Lose 1 pound per week.  Being Active - Walk 30 min daily    TIME SPENT WITH PATIENT:  Time spent with patient:  60 minutes  Time spent discussing diagnosis, management, and treatment plan:  > 35 minutes  Details:

## 2012-06-20 NOTE — Unmapped (Signed)
Pt is wanting to know if PT and Aquatic therapy orders can be sent to [facility]? Pt is also stating she needs to ask you a couple questions about her disability forms?

## 2012-06-20 NOTE — Unmapped (Signed)
PT and Aquatic Therapy orders faxed to the [facility] @ [phone].  Patients' medical records and MRI reports faxed to Jovita Gamma (attorney for disability) @ [phone].  Attorney's phone number for future reference is [phone].

## 2012-06-28 ENCOUNTER — Ambulatory Visit: Admit: 2012-06-28

## 2012-06-30 NOTE — Unmapped (Signed)
Spoke with patient she has her appt set up at [facility]

## 2012-06-30 NOTE — Unmapped (Signed)
Is stating she has not received a call from [facility] about PT and is wanting to touch base on this, Is needing to discuss Rebif medication.

## 2012-07-03 NOTE — Unmapped (Signed)
SUPPORT PLAN  External Discussion/Support Groups    Diabetes Magazine Supscriptions    Diabetes Resource Books    Diabetes Websites   www.diabetes.org   www.eatright.org   www.dLife.com   www.cdc.gov/diabetes/ndep/index.htm   www.calorieking.com   desa@diabetes-exercise.org   www.diabetesnet.com   Diabetes product websites (medicaiton, meters, etc)    DSMT Follow-Up    Join a fitness center, gym or Senior Center for activity/social support.    Work Site Health Programs    Community Health Fairs    Join a Weight Loss program    Participate in a Diabetes Management program    Contact local ADA/JDRF office

## 2012-07-06 ENCOUNTER — Ambulatory Visit: Admit: 2012-07-06 | Discharge: 2012-07-06 | Payer: PRIVATE HEALTH INSURANCE

## 2012-07-06 DIAGNOSIS — G35 Multiple sclerosis: Secondary | ICD-10-CM

## 2012-07-06 NOTE — Unmapped (Signed)
Physical Therapy Neuromuscular Evaluation Form     Date of Evaluation: 07/06/2012  Referring MD: Audelia Hives,*  Primary Dx: MS  Onset Date: 2004  PMH: See Medical History.  Medication Reviewed: See Medication List.      Subjective - see intake form    Objective    Cardiovascular: BP:__/__ []  n/a , Pulse: __ bpm n/a []  , Respiration:__ resp rate; O2 sat___ []   n/a   Occulomotor Screen:wnl    Sitting:  Isolated Movement: wnl all   UE's grossly wnl    strength     L R   Shldr Flex     Elbow Flex      Ext     Wrist Ext      grip     Hip Flex 5-/5 4-/5   Knee Ext 5 5   Ankle  df 5 4    pf     Poor core support  Coordination  UE: finger-nose-finger (targeted reach)   L []  marked impairment []  mild impairment []  wnl []  n/a  R []  marked impairment []  mild impairment []  wnl []  n/a  LE: rapid alternating toe taps  L []  marked impairment []  mild impairment []  wnl []  n/a  R []  marked impairment []  mild impairment []  wnl []  n/a  Other coordination tests:        Sensation:[]  fully intact []   Other__    Proprioception:[]  fully intact []  other___    Sitting Balance Assessment:  Pt is able to sit in midline with both heels on ground [x]  yes []  no Describe position if no:   [] poor- pt requires manual assist to sit, unable to move body and maintain balance   [] fair- able to maintain ind balance with UE assist, able to turn head and maintain balance   [] good- able to ind maintain balance w arms across chest, able to reach to floor    [] normal- able to maintain balance against manual resistance, able to reach outside of BOS in all directions.    Sit to Stand: [x]  independent []  requires UE assist []  requires physical assist:  Movement Analysis:    Posture/Alignment  (check all that apply) wnl       Lying:  Modified Ashworth:   [x] 0 = No increase in tone    [] 1 = Catch Wristand release/minimal resistance at end ROM     [] 1+=Catch, minimal resistance through 1/2 ROM     [] 2 = Increased resistance through most of ROM     [] 3 =  PROM is difficult    [] 4 = Rigid   Location:      ROM:  []  no deficits noted  []  the following deficits are noted:__  Strength:   strength    R L   Hip abd     Hip ext     Knee flex       Bed Mobility:     Standing:  Balance:[]  see functional tests   [] poor- pt requires manual assist to stand, unable to move body and maintain balance   [] fair- able to maintain ind balance with assistive device, able to move head/UE and maintain balance   [] good- able to ind maintain balance w arms across chest, able to reach to floor and recover    [] normal- able to maintain balance against manual resistance, able to step outside of BOS in all directions.    Gait Analysis:see below  Gait deviations: wide BOS, espec without cane  Stance phase: unequal  L/R   []  knee hyperextension []  pelvic retraction []  ankle pf []  hip instability []  Knee hyperflexion [x]  trunk instability - decreased core support []  inconsistent foot placement  Swing phase:[x]  decreased foot R clearance, mild hip hiking,    Stairs:[]  pt is unable to perform stairs []  pt is able to ascend/descend __ stairs with __ railing(s) using []  unilateral []  reciprocal pattern  Functional Outcome Testing:  [] Tinetti:   [] Berg:   ??[x] Timed Up & Go: 14 sec with cane,    15 sec without  [] 5 rep sit to stand:   [] DGI:   [] FGA:   [] MiniBEST:     [x] 6 min walk: with cane, 788' - c/o incr lbp to 6/10  [] Other Tests and Measurements: Reports back and knee pain killing me after 3 min of standing balance testing    Assessment: Partial eval today due to pt late arrival. She had not yet completed Pelvic Floor History Intake Questionnaire, and is to bring next visit for that part of evaluation info.  Pt has limited ambulation endurance, and secondary L knee and LBP, decreased RLE strength with gait deviations, decreased balance.     Plan:Balance testing next, and complete MMT, check tone.  Include pelvic floor eval if time permits.  Plan to initiate aquatic ex at visit #3    Treatment  Time today 45 min.

## 2012-07-06 NOTE — Unmapped (Signed)
Physical Therapy Evaluation Form     Date of Evaluation: 07/06/2012  Referring MD: Audelia Hives CNP  (Prior Hulan Fess MD) [phone] ph  Primary Dx: MS  Onset Date: 2004 diagnosed  PMH: recent diag DM  Medication Reviewed: See Medication List.           Subjective Information  Pt 20 min late to appt so limited Eval performed today. Pt has 2 PT referrals, originating from her urogynecologist and her neurologist. Pt wishes to pursue referral from neurologist today. Tingling in in B hands and thighs.  L knee pain.   Started going to [facility] class [facility]- was helpful. Participated until last week.  Here @ [facility] because pool access at [facility] too far - too hot in summer.  Feels cannot afford community aquatic at this time.    Chief Complaint:Body is falling apart       Mechanism of Injury: N/A    Diagnostic Tests/Results: L knee by orthopedist recently; Had cortisone injection for knee, but only helped 1 week. Got knee brace also but seldom uses due to edema.    Precautions: Reports recent psych admission    Previous Treatments: Last @ [facility] several years ago combined land and aquatic tx.    Pain:  _3__ /10 BEST   __7_/10 WORST  ___/10 AVERAGE  Location: knee   -Aggravating Factors- 1st sit to stand knee pain;   -Relieving Factors  Rates LBP 3/10 at rest - walking too long incr pain    Home Set Up: Apt, no stairs.  Lives alone, but daughters and grandchildren across street. Freq assist available. Has shower chair and HH shower, cane.    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 Currently on work disability for 6 mo. Has applied for SSDI.    PLOF: see work above    Current LOF: With cane able to walk at least 15 minutes.  Sitting tolerance OK. Does own cooking and most cleaning, laundry. Driving and does own shopping/errands.  No signif hx of falls    Patient Goal for Therapy: maximize function/prevent deterioration; walk more without LBP    Comments: Pt c/o difficulty with short term  memory (primary cause for no longer able to do job)

## 2012-07-12 NOTE — Unmapped (Signed)
Is needing a letter stating about her chronic illness, pt is stating she will pick this up. Please call to advise.

## 2012-07-12 NOTE — Unmapped (Signed)
Melanie [name] will call when she comes back to office

## 2012-07-13 MED ORDER — cholecalciferol, vitamin D3, 1000 units tablet
1000 | ORAL_TABLET | Freq: Two times a day (BID) | ORAL | Status: AC | PRN
Start: 2012-07-13 — End: 2014-10-01

## 2012-07-14 ENCOUNTER — Encounter: Admit: 2012-07-14 | Discharge: 2012-07-14 | Payer: PRIVATE HEALTH INSURANCE

## 2012-07-14 DIAGNOSIS — R269 Unspecified abnormalities of gait and mobility: Secondary | ICD-10-CM

## 2012-07-14 NOTE — Unmapped (Signed)
PHYSICAL THERAPY EVALUATION SUMMARY AND PLAN OF CARE    Patient Name: Melanie [name]  ZO#:[id number]     DOB: 10/06/1956    Referring Provider:Shirley, Minta JolleneCNP / Hulan Fess MD                                     Lowella Petties MD    Medical Diagnosis:  1. Gait abnormality    2. Multiple sclerosis      Date of Evaluation:  07/06/12       Goals Current Status / Functional Deficits   Reduce knee/LBP to 2-3/10 to improve ambul and ADL tolerance L knee/LBP pain rated  6/10, limits ambul and standing tasks   Increase 6 min walk with cane to 850', and TUG to 12 sec  Berg Score 53/56 Decreased ambul tolerance, balance and speed- 6 min walk 780' using cane, with LBP, TUG 15 sec without cane; Berg Balance score 51/56   Able to sustain 'near tandem' position with heel to heel distance of 2-3 Unable to perform tandem stance or single limb support standing   Indep HEP Needs updated HEP   See separate Pelvic POC Urinary incontinence, frequency, urgency     Assessment:  BLE strengths are 4+/5. Tone wnl.  Difficulty with balance with narrow BOS and Single limb support.  Poor ambulation endurance due to L knee, LBP pain. Pt had been going regularly to community aquatic ex program prior to [facility] referral.  States she requested PT @ [facility] due to distance has to walk to prior pool, in summer heat, was too much.  Will review aquatic ex and then focus on training in land-based HEP and pelvic floor rehab.    Recommendations/ Plan:  Pt to be seen for therapeutic ex for strength / endurance, Neuromuscular re-education to facilitate improved balance, coordination and ease of movement, gait training to increase safety and endurance and therapeutic activities for transition of skills to home activities/ADL's / improved community access. Home ex program.    Rehabilitation Potential:  good     Treatment Plan for Physical Therapy:   2 times/ week for 4 weeks.                                                  Lenell Mcconnell P  Keali Mccraw PT      07/14/2012  Physical Therapist     Date  ____________________________________________________________    Physician Certification  I certify that the above patient is under my care an requires the above services. These professional services are to be provided from an established plan, related to the diagnosis and reviewed by me every 90 days.     Additional comments/revisions:     Physician Name: Audelia Hives CNP/ Hulan Fess MD    Signature_______________________________________ Date: _______

## 2012-07-14 NOTE — Unmapped (Signed)
Letter for chronic illness completed and left at the front desk for pt to pick up.  A message was left for Dr. Luiz Ochoa requesting pts neuropsych results because we cannot access the scanned version in Epic.

## 2012-07-14 NOTE — Unmapped (Signed)
PT Daily Note  Diagnosis:   1. Gait abnormality    2. Multiple sclerosis    Precautions:L knee pain, DJD    Date of initial evaluation: 07/06/12 Date of last progress note:  Payor: [place]  Plan: [place]  Product Type: HMO                    30v pcy for all therapies authorized per insurance:     # visits per POC 8    Therapy goals    Subjective: Pt arrived 35 minutes late.  Today, just not feeling well; nausea, OK yest.   Pain: 5 /10,  L knee and LBP.  Pt brought in Pelvic Floor Intake Questionnaire - reviewed answers (see form)    Objective:   Completed Eval components:  Berg Balance score 51/56    LE MMT all hip strengths 4+/5 to 5/5 and mid trap 4+/5 L, 4-/5 R; B shlder flex/abd 5/5    Assessment: Due to short session, did not get to issue HEP yet.  Berg test good except in areas of narrow BOS/ SLS, and  EC tests. Reviewed pt schedule and planned mix of land and aquatic-based appts. Ex to improve LE strength, improve balance and decrease knee pain.  See POC for goals.    Plan: Next visit aquatic session. Review her past aquatic ex activities and trial :  Walking laps, F/B reverse stepping, step ups, LE PRE's, tandem walk or other balance/narrow BOS tasks,  March/jog for endurance training.     Will plan to complete pelvic floor / lumbar eval at next land appt on 07/21/12

## 2012-07-17 NOTE — Unmapped (Signed)
Spoke to pt and let her know we received neuropsych eval results.  I made a copy and left it at the front desk for pt to pick up.

## 2012-07-18 ENCOUNTER — Encounter
Admit: 2012-07-18 | Discharge: 2012-07-18 | Payer: PRIVATE HEALTH INSURANCE | Attending: Rehabilitative and Restorative Service Providers"

## 2012-07-18 DIAGNOSIS — R269 Unspecified abnormalities of gait and mobility: Secondary | ICD-10-CM

## 2012-07-18 NOTE — Unmapped (Signed)
Diagnosis:   1. Gait abnormality    2. Multiple sclerosis      Precautions: psychiatric disorder  Date of initial evaluation: 07/06/12 Date of last progress note: 07/06/12  Payor: [place]  Plan: [place]  Product Type: HMO    # visits authorized per insurance: 30 V PCY    Goals Current Status / Functional Deficits   Reduce knee/LBP to 2-3/10 to improve ambul and ADL tolerance L knee/LBP pain rated  6/10, limits ambul and standing tasks   Increase 6 min walk with cane to 850', and TUG to 12 sec  Berg Score 53/56 Decreased ambul tolerance, balance and speed- 6 min walk 780' using cane, with LBP, TUG 15 sec without cane; Berg Balance score 51/56   Able to sustain 'near tandem' position with heel to heel distance of 2-3 Unable to perform tandem stance or single limb support standing   Indep HEP Needs updated HEP   See separate Pelvic POC (per Lowella Petties MD) Urinary incontinence, frequency, urgency     Subjective: Pt arrives to pool late for aquatic therapy, was waiting in gym waiting room;   Pain: not much, a little /10  Description:    Location:      Objective: Treatment as follows in pool  Date: 07/18/12    Visit #: (8 per POC) 3    Aquatics: Walking     Ambulate: F/B/S X 1 each         Aquatics:Stretching     SKTC 30 L/R    Piriformis stretch ---    Hamstring stretch 30 L/R    Quadriceps stretch ---    Calf stretch ---         Aquatics: UE strengthening     Shoulder flex/ext ---    Shoulder abd/add ---    Shoulder horizontal add/abd ---    Shoulder diagonals ---         Aquatics: LE strengthening     SLR hip flexion X 10 L/R, cues for form    SLR hip extension X 10 L/R    SLR hip abd/add X 10 L/R    Open Gate (hip flex+ER) X 10 L/R    Hamstring curl X 10 L/R    Knee ext/flex (with hip flex) X 10 L/R    Heel& toe raises X 10 L/R    Squats X 10 L/R    Single-leg balance 5 x 3 L/R    Step-ups: forward     Step-ups: lateral          Aquatics: Trunk exercises     Standing PPT     Standing PPT + march     Aquaciser  push-pull     Aquaciser shoulder flex/ext     Noodle truck rotation     Seated balance on float          Aquatic Program: Deep-water     Noodle float: cycle/jog X 3 minutes    Noodle float: jumping jacks X 2 minutes    Noodle float: skiing X 2 minutes         Total treatment time (min) 34 minutes    Time per procedure (min) Aquatic 34 min    F/B/S=forwards,backwards,sideways; SKTC=single-knee-to-chest; SLR=straight leg raise; DBs=dumbbells; PPT=posterior pelvic tilt    Progress towards goals: review aquatic program initiated    Education: aquatic activities and form/technique as listed above    Assessment: Some exercises apparently new to pt; pt needs further review of aquatic program  and to be provided handout      Plan: perform aquatic program next visit to further review aquatics, additional exercise as appropriate and time allows.

## 2012-07-21 ENCOUNTER — Encounter: Admit: 2012-07-21 | Discharge: 2012-07-21 | Payer: PRIVATE HEALTH INSURANCE

## 2012-07-21 DIAGNOSIS — R269 Unspecified abnormalities of gait and mobility: Secondary | ICD-10-CM

## 2012-07-21 NOTE — Unmapped (Signed)
Pelvic Floor Physical Therapy Evaluation     Melanie Petties MD  Pelvic Floor Dysfunction, MS,OAB  07/21/2012    S: See Pelvic Floor Pt Hx Intake Questionnaire in paper chart    QOL Measure to be issued next visit  Informed consent for internal evaluation  - Consent given   Yes   See copy of paper consent form in chart    Posture no scoliosis, hips level, heavy bust/overweight  Gilett test - unable  Gait - see initial eval    Lumbar ROM -overall                                                       flexion FIS reaches mid shin, - pain     extension NT       SB 70% ROM each, pain to R       Rot 50% each, feels sore/stiff     LE  MMT/ ROM           L               R           Hip exten 3+ 4-/5   Hip abd 4+/5    Other MMT per initial eval.    Landmarks:  Supine ASIS and leg lengths =  Pubic symphysis  Prone PSIS    ABDOMINAL PALPATION - TP: ASIS and at adductor tendon insertion at pelvis B    External Perineal Observation        absent      present   Voluntary contraction  weak   Involuntary relaxation (after contraction)  x   Involuntary contraction (with cough)     Perineal descent @ rest x    Perineal descent with bearing down     Scarring     General skin condition wnl      Internal Pelvic Floor Palpation  PFM tone hypertonic   Vaginal vault size tight   Muscle Volume wnl/incr   Sensation wnl   TENDER POINTS/SPASM /MF BANDS Superficial and deep layer PFM 5/10 (at rest) R/L 5-8/10;  Very painful with finger insertion also   Tissue Laxity A/P wall (prolapse) NT   Urethra No incr tenderness, mobility wnl     PFM Contraction Ability:  General strength                       weak     MMT 3-/5 squeeze, min to no lift                  superficial layer 3-/5   Voluntary Realx           complete    Endurance (sec) 3-4 sec      # Quick contractions in 10 sec 3-4      Overflow + abdominals Some buttocks        Treatment today:  Evaluation/examination x Bladder diary given x   Bladder and PFM education x PFM/ other Ex x     Bladder  diary results: issued  # voids/ 24 hrs  Avg voiding interval    # leaks / 24 hrs  Fluid intake /24 hrs    Min voiding interval  Irritant intake /24 hrs    Min voided volume  Assessment: PFM assessment today shows weakness and poor endurance of PFM contraction with abdominal substitution. Mildly decreased lumbar AROM/min pain.  Poor core support/hip strengths. Functional c/o urinary frequency, UI ( stress/urge), mod/severe tenderness with palpation of all PFM, LBP.  She would benefit from trial of PFM ex to improve strength/endurance/coord to reduce UI, urgency and and LBP. See also POC for primary diag of MS.    Plan: See POC for full tx plan and goals.    Tx Time Today 46 min    Maximino Sarin PT  07/21/2012
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
## 2012-07-24 NOTE — Unmapped (Signed)
PHYSICAL THERAPY PELVIC EVALUATION SUMMARY/ PLAN OF CARE    Patient Name: Melanie [name]     DOB: 02-Dec-1956         Date of Evaluation:  07/21/12   Lowella Petties, MD    Medical Diagnosis:   1. Gait abnormality    2. Multiple sclerosis    3. Pelvic floor dysfunction      Functional Deficits  /  Initial Status  Decreased strength of: PFM  3-/5, hips 3/5, poor core support   Decreased PFM endurance  3 sec      PFM trigger points- Pain rated 8/10; poor tolerance pelvic exam   Increased tone PFM / Decreased relaxation ability   Muscle substitution with PFM contraction- abdominals   Lumbar ROM 70% with mild discomfort   Decreased walking tolerance due to L knee/ LBP   15  Minutes with cane (see alternate POC also)   Urinary urgency; 1-2 Minutes deferral of urge; UI 2-5x/wk   Limited social activities due to pain or UI   Poor knowledge of self-help/pain management techniques; needs updated HEP     Goals:  Increased strength of PFM to 3+/5, hips to 4+/5   Improved PFM endurance to 10 sec with good quality/ isolation of movement   Reduce PFM pain / trigger points to  3 /10   Normalize PFM tone / improved relaxation ability   Good core support to maintain correct postural align during ex and ADL's   Able to defer urination for 15 minutes/ UI only 2x/month   Demonstrates good knowledge of self-help/pain management techniques   Indep Home Ex Program       Assessment:  PFM assessment today shows weakness and poor endurance of PFM contraction with abdominal substitution. Mildly decreased lumbar AROM/min pain.  Poor core support/hip strengths. Functional c/o urinary frequency, UI ( stress/urge),mod/severe tenderness with palpation of all PFM and LBP.  She would benefit from trial of PFM ex to improve strength/endurance/coord to reduce UI, urgency and decrease PFM / LBP. See also POC for primary diag of MS.    Recommendations/ Plan:  Pt to be seen for therapeutic exercise for strength and flexibility, neuromuscular  re-education, manual therapy for soft tissue/joint mobilization, pt education in B & B training, proper body mechanics, self pain management techniques, Home ex program.  Modalities as needed.     Treatment Plan for Physical Therapy:   1-2 times/ week for 8 weeks.                                                  Alyxander Kollmann P Donnavin Vandenbrink      07/24/2012  Physical Therapist     Date  ____________________________________________________________________________    Physician Certification  I certify that the above patient is under my care an requires the above services. These professional services are to be provided from an established plan, related to the diagnosis and reviewed by me every 90 days.     Additional comments/revisions:     Physician Name (printed): _______________________________________  Signature_____________________________________________________ Date: _______   -

## 2012-07-31 NOTE — Unmapped (Signed)
Diagnosis:   1. Pelvic floor dysfunction    2. Gait abnormality    3. Overactive bladder    4. Multiple sclerosis      Precautions: psychiatric disorder  Date of initial evaluation: 07/06/12 Date of last progress note: 07/06/12  Payor: [place]  Plan: Melanie [name]  Product Type: HMO    # visits authorized per insurance: 30 V PCY    Goals Current Status / Functional Deficits   Reduce knee/LBP to 2-3/10 to improve ambul and ADL tolerance L knee/LBP pain rated  6/10, limits ambul and standing tasks   Increase 6 min walk with cane to 850', and TUG to 12 sec  Berg Score 53/56 Decreased ambul tolerance, balance and speed- 6 min walk 780' using cane, with LBP, TUG 15 sec without cane; Berg Balance score 51/56   Able to sustain 'near tandem' position with heel to heel distance of 2-3 Unable to perform tandem stance or single limb support standing   Indep HEP Needs updated HEP   See separate Pelvic POC (per Lowella Petties MD) Urinary incontinence, frequency, urgency     Subjective: Pt c/o pain in L knee. C/o stomach pain while biking on noodle.  Pain: 3/10  Description:     Location: L knee      Objective: Treatment as follows in pool  Date: 07/18/12 07/31/12   Visit #: (8 per POC) 3 5 pool   Aquatics: Walking     Ambulate: F/B/S X 1 each X 2 each        Aquatics:Stretching     SKTC 30 L/R H 30 x 2 R/L   Piriformis stretch --- H 30 x 2 R/L   Hamstring stretch 30 L/R H 30 x 2 R/L   Quadriceps stretch ---    Calf stretch --- H 30 x 2 R/L        Aquatics: UE strengthening     Shoulder flex/ext --- X 10   Shoulder abd/add --- X 10   Shoulder horizontal add/abd --- X 10 cues to hold trunk stable   Shoulder diagonals --- X 10     X 10    Aquatics: LE strengthening     SLR hip flexion X 10 L/R, cues for form X 15 L/R   SLR hip extension X 10 L/R X 15 L/R   SLR hip abd/add X 10 L/R X 15 L/R   Open Gate (hip flex+ER) X 10 L/R X 15 L/R   Hamstring curl X 10 L/R X 15 L/R   Knee ext/flex (with hip flex) X 10 L/R X 15 L/R   Heel& toe raises  X 10 L/R X 15 L/R   Squats X 10 L/R X 15 L/R   Single-leg balance 5 x 3 L/R H 5 x 3 L/R   Step-ups: forward     Step-ups: lateral          Aquatics: Trunk exercises     Standing PPT     Standing PPT + march     Aquaciser push-pull     Aquaciser shoulder flex/ext     Noodle truck rotation     Seated balance on float          Aquatic Program: Deep-water     Noodle float: cycle/jog X 3 minutes X 4 minutes   Noodle float: jumping jacks X 2 minutes    Noodle float: skiing X 2 minutes X 4 minutes        Total treatment  time (min) 46 minutes    Time per procedure (min) Aquatic 46 min    F/B/S=forwards,backwards,sideways; SKTC=single-knee-to-chest; SLR=straight leg raise; DBs=dumbbells; PPT=posterior pelvic tilt    Progress towards goals: Pt completed all exercises with little to no difficulty.    Education: UE exercises added.    Assessment: Pt responded well to  treatment session and was able to complete all exercises. Pt was addiment about not doing jacks as part of treatment plan.  Pain decreased to 2/10 after treatment.       Plan: pt will be seen in gym for next treatment session

## 2012-08-02 NOTE — Unmapped (Signed)
No show to appt

## 2012-08-07 ENCOUNTER — Encounter: Admit: 2012-08-07 | Discharge: 2012-08-07 | Payer: PRIVATE HEALTH INSURANCE

## 2012-08-07 DIAGNOSIS — G35 Multiple sclerosis: Secondary | ICD-10-CM

## 2012-08-07 NOTE — Unmapped (Signed)
PT Daily Note  Diagnosis:   1. Pelvic floor dysfunction    2. Gait abnormality    3. Overactive bladder    4. Multiple sclerosis                            Precautions:   Date of initial evaluation: 07/14/12 Date of last progress note:   Payor: [place]  Plan:   Product Type: HMO     # visits authorized per insurance:30              # visits per POC 16  Goals   Reduce knee/LBP to 2-3/10 to improve ambul and ADL tolerance   Increase 6 min walk with cane to 850', and TUG to 12 sec  Berg Score 53/56   Able to sustain 'near tandem' position with heel to heel distance of 2-3   Indep HEP   See separate Pelvic POC (per Lowella Petties MD)     Subjective: States she joined a [employer] center to do a line dance class (with her cane) and has an indoor walking track.  So has been doing that for 2 weeks now. Knee has been less painful in past 2 days. Back is OK  States she does some of her pool ex's standing, at home and some mat ex's   Pain: 0/10    Description:  Location:     Objective: Treatment as   Reviewed her own prior HEP: Bridging, sktc lifts, Btkc lift, hookly B hip ER/Abd - Add, SL hip abd , standing gastroc stretch     Modified as below for better technique    Date: 08/07/12  Land     Visit #:     / POC) #6    Therapeutic Exercises To improve functional strength, endurance, flexibility, ROM    # artic bridging x10    # Supine core stab with sktc lifts x10    # Seated marching with core stab x30    # Wall sits H 10 x5; c/o incr L knee pain    # SL hip abd x10    Neuromuscular Re-education To improve balance, coordination, WB tolerance    # Tandem balance with cerv rot / UTR     Standing balance with foot slide on washcloth L LBP when L WB     Seated hip add/abd isom with PFM contraction x5 H10    Therapeutic Activities To improve functional activity independence, safety         Gait Training To improve mobility independence, safety,endurance         Manual Therapy To restore joint or tissue mobility;  reduce pain                   Modalities To reduce pain, inflammation, swelling         Skilled treatment time (min)       Progress towards goals: Able to perform HEP with better technique    Education: HEP given as noted above(#)    Assessment: Tolerated all well except L knee pain with wall sits.    Plan: Next session @ pool again.  Next session in gym, will review HEP for Pelvic floor weakness/coord.

## 2012-08-10 ENCOUNTER — Encounter: Admit: 2012-08-10 | Discharge: 2012-08-10 | Payer: PRIVATE HEALTH INSURANCE

## 2012-08-10 DIAGNOSIS — M6289 Other specified disorders of muscle: Secondary | ICD-10-CM

## 2012-08-10 NOTE — Unmapped (Signed)
Diagnosis:   1. Pelvic floor dysfunction    2. Gait abnormality    3. Overactive bladder    4. Multiple sclerosis      Precautions: psychiatric disorder  Date of initial evaluation: 07/06/12 Date of last progress note: 07/06/12  Payor: [place]  Plan: [place]  Product Type: HMO    # visits authorized per insurance: 30 V PCY    Goals Current Status / Functional Deficits   Reduce knee/LBP to 2-3/10 to improve ambul and ADL tolerance L knee/LBP pain rated  6/10, limits ambul and standing tasks   Increase 6 min walk with cane to 850', and TUG to 12 sec  Berg Score 53/56 Decreased ambul tolerance, balance and speed- 6 min walk 780' using cane, with LBP, TUG 15 sec without cane; Berg Balance score 51/56   Able to sustain 'near tandem' position with heel to heel distance of 2-3 Unable to perform tandem stance or single limb support standing   Indep HEP Needs updated HEP   See separate Pelvic POC (per Lowella Petties MD) Urinary incontinence, frequency, urgency     Subjective: Pt c/o pain in L knee. C/o stomach pain while biking on noodle.  Pain: 3/10  Description:     Location: L knee      Objective: Treatment as follows in pool  Date: 07/18/12 07/31/12 08/10/12   Visit #: (8 per POC) 3 5 pool 7 pool   Aquatics: Walking      Ambulate: F/B/S X 1 each X 2 each X 3 each         Aquatics:Stretching      SKTC 30 L/R H 30 x 2 R/L H 30 x 3 R/L   Piriformis stretch --- H 30 x 2 R/L H 30 x 3 R/L   Hamstring stretch 30 L/R H 30 x 2 R/L H 30 x 3 R/L   Quadriceps stretch ---  H 30 x 3 R/L   Calf stretch --- H 30 x 2 R/L H 30 x 3 R/L         Aquatics: UE strengthening      Shoulder flex/ext --- X 10 X 15   Shoulder abd/add --- X 10 X 15   Shoulder horizontal add/abd --- X 10 cues to hold trunk stable X 15   Shoulder diagonals --- X 10  x 15     X 10     Aquatics: LE strengthening      SLR hip flexion X 10 L/R, cues for form X 15 L/R X 15 L/R    SLR hip extension X 10 L/R X 15 L/R X 15 R/L   SLR hip abd/add X 10 L/R X 15 L/R X 15  R/L   Open Gate (hip flex+ER) X 10 L/R X 15 L/R X 15 R/L   Hamstring curl X 10 L/R X 15 L/R X 15 R/L   Knee ext/flex (with hip flex) X 10 L/R X 15 L/R    Heel& toe raises X 10 L/R X 15 L/R X 15    Squats X 10 L/R X 15 L/R X 15   Single-leg balance 5 x 3 L/R H 5 x 3 L/R H 30 x 3 R/L w/o UE   Step-ups: forward      Step-ups: lateral            Aquatics: Trunk exercises      Standing PPT      Standing PPT + march  Aquaciser push-pull      Aquaciser shoulder flex/ext      Noodle truck rotation      Seated balance on float            Aquatic Program: Deep-water      Noodle float: cycle/jog X 3 minutes X 4 minutes 5 min   Noodle float: jumping jacks X 2 minutes     Noodle float: skiing X 2 minutes X 4 minutes 4 min         Total treatment time (min) 46 minutes  45 min   Time per procedure (min) Aquatic 46 min  45 min   F/B/S=forwards,backwards,sideways; SKTC=single-knee-to-chest; SLR=straight leg raise; DBs=dumbbells; PPT=posterior pelvic tilt    Progress towards goals: Pt.advanced with exercises.    Education: Cues with posture.    Assessment: Pt responded well to  treatment session and was able to complete all exercises.  Pain decreased to 1/10 after treatment.       Plan: pt will be seen in gym for next treatment session

## 2012-08-14 NOTE — Unmapped (Signed)
Pt cancelled appt due to not feeling well.  Note pt also has missed appts on 8/5 and 08/02/12

## 2012-08-16 ENCOUNTER — Encounter: Admit: 2012-08-16 | Discharge: 2012-08-16 | Payer: PRIVATE HEALTH INSURANCE

## 2012-08-16 DIAGNOSIS — M6289 Other specified disorders of muscle: Secondary | ICD-10-CM

## 2012-08-16 NOTE — Unmapped (Signed)
Diagnosis:   1. Pelvic floor dysfunction    2. Gait abnormality    3. Overactive bladder    4. Multiple sclerosis      Precautions: psychiatric disorder  Date of initial evaluation: 07/06/12 Date of last progress note: 07/06/12  Payor: [place]  Plan:   Product Type: HMO    # visits authorized per insurance: 30 V PCY    Goals Current Status / Functional Deficits   Reduce knee/LBP to 2-3/10 to improve ambul and ADL tolerance L knee/LBP pain rated  6/10, limits ambul and standing tasks   Increase 6 min walk with cane to 850', and TUG to 12 sec  Berg Score 53/56 Decreased ambul tolerance, balance and speed- 6 min walk 780' using cane, with LBP, TUG 15 sec without cane; Berg Balance score 51/56   Able to sustain 'near tandem' position with heel to heel distance of 2-3 Unable to perform tandem stance or single limb support standing   Indep HEP Needs updated HEP   See separate Pelvic POC (per Lowella Petties MD) Urinary incontinence, frequency, urgency     Subjective: Pt c/o mild LBP.  Pain: 2/10  Description:     Location: L knee      Objective: Treatment as follows in pool  Date: 07/18/12 07/31/12 08/10/12 08/16/12   Visit #: (8 per POC) 3 5 pool 7 pool 8   Aquatics: Walking       Ambulate: F/B/S X 1 each X 2 each X 3 each 1x ea          Aquatics:Stretching       SKTC 30 L/R H 30 x 2 R/L H 30 x 3 R/L H30  x2 no decr LBP noted   Piriformis stretch --- H 30 x 2 R/L H 30 x 3 R/L    Hamstring stretch 30 L/R H 30 x 2 R/L H 30 x 3 R/L    Quadriceps stretch ---  H 30 x 3 R/L    Calf stretch --- H 30 x 2 R/L H 30 x 3 R/L           Aquatics: LE strengthening       SLR hip flexion X 10 L/R, cues  X 15 L/R X 15 L/R     SLR hip extension X 10 L/R X 15 L/R X 15 R/L    SLR hip abd/add X 10 L/R X 15 L/R X 15 R/L    Open Gate (hip flex+ER) X 10 L/R X 15 L/R X 15 R/L Hold N, x10 ea   F/B reverse stepping   X 10 L/R X 15 L/R   3 or 4 steps F/B x20 w/o UE support   Heel& toe raises X 10 L/R X 15 L/R X 15     Squats X 10 L/R X 15  L/R X 15 x20 decr LBP   Single-leg balance      - small Leg circles 5 x 3 L/R H 5 x 3 L/R H 30 x 3 R/L w/o UE - circles 10-15x ea, w/o UE support   Step-ups: forward    x10 ea, no UE support with recip pattern used   Step-ups: lateral       Balance tandem walk fwd    2 laps7-8 BOS   Aquatics: Trunk exercises       Standing PPT + march       Aquaciser push-pull       Aquatic Program: Deep-water  Noodle float: cycle/jog X 3 minutes X 4 minutes 5 min    Noodle float: jumping jacks X 2 minutes   2 min   Noodle float: skiing X 2 minutes X 4 minutes 4 min          Conditioning -  March/jog in place    3 min   Total treatment time (min) 46 minutes  45 min    Time per procedure (min) Aquatic 46 min  45 min    F/B/S=forwards,backwards,sideways; SKTC=single-knee-to-chest; N= Foam floatation cylinder; SLR=straight leg raise; DBs=dumbbells; PPT=posterior pelvic tilt    Progress towards goals: Pt.advanced with balance tasks.    Education: Cues with posture.    Assessment: Pt responded well to  treatment session and was able to complete all exercises. Note wide BOS with step ups.  LBP end of session 3/10. Reports mod fatigue after pool sessions      Plan: pt will be seen in gym for next treatment session & re-eval pelvic floor progress.

## 2012-08-25 ENCOUNTER — Ambulatory Visit: Admit: 2012-08-25

## 2012-08-25 ENCOUNTER — Encounter: Admit: 2012-08-25 | Discharge: 2012-08-25 | Payer: PRIVATE HEALTH INSURANCE

## 2012-08-25 DIAGNOSIS — M6289 Other specified disorders of muscle: Secondary | ICD-10-CM

## 2012-08-25 NOTE — Unmapped (Signed)
PT Daily Note  Diagnosis:Pelvic floor dysfunction, MS         Precautions:   Date of initial evaluation: 07/14/12 Date of last progress note:   Payor: [place]  Plan: [place]  Product Type: HMO     # visits authorized per insurance:30              # visits per POC 16  Goals   Reduce knee/LBP to 2-3/10 to improve ambul and ADL tolerance   Increase 6 min walk with cane to 850', and TUG to 12 sec  Berg Score 53/56   Able to sustain 'near tandem' position with heel to heel distance of 2-3   Indep HEP   See separate Pelvic POC (per Lowella Petties MD)     Pelvic Dysfunction Goals:  Increased strength of PFM to 3+/5, hips to 4+/5  Improved PFM endurance to 10 sec with good quality/ isolation of movement  Reduce PFM pain / trigger points to  3 /10  Normalize PFM tone / improved relaxation ability  Good core support to maintain correct postural align during ex and ADL's  Able to defer urination for 15 minutes/ UI only 2x/month  Demonstrates good knowledge of self-help/pain management techniques    Subjective: Ability to defer urination unchanged. Has not attempted to defer as worries about leakage  States she has less leakage after urination. Only occurs 2x/month  Has only occassional leakage related to urgency to go, 2x month  Ambul tolerance reports approx 5-7 min.@ a time on walking track. Varies with heat, etc. Continues with line dance class.  Knee pain 1/10 for last 5 days. Back pain has been min to none. Ex at least 2-3x/day for short periods.     Pain: 0/10    Description:  Location:     Objective:   Re-eval  Lumbar ROM FIS reach mid shin, EIL  50% with LBP, SB 50%, Rot 70%; all with end range LBP  Hip MMT  Flex  5-/5 B  abd   5-/5  exten 4-/5 B    PFM Re-Eval supine -  TP L lev ani and OI 3/10 and sensitive to finger insertion/removal  MMT 2/5 and sustains contraction only 2-3 sec. Good resting tone(1-1.5uV)  Max contraction 2-3.0uV  Good coord of quick flics, but low amplitude.    Date: 08/07/12  [place]     Visit #:      / POC) #6    Therapeutic Exercises To improve functional strength, endurance, flexibility, ROM    # artic bridging x10    # Supine core stab with sktc lifts x10    # Seated marching with core stab x30    # Wall sits H 10 x5; c/o incr L knee pain    # SL hip abd x10    Neuromuscular Re-education To improve balance, coordination, WB tolerance    # Tandem balance with cerv rot / UTR     Standing balance with foot slide on washcloth L LBP when L WB     Seated hip add/abd isom with PFM contraction x5 H10    Therapeutic Activities To improve functional activity independence, safety         Gait Training To improve mobility independence, safety,endurance         Manual Therapy To restore joint or tissue mobility; reduce pain                   Modalities To reduce pain, inflammation,  swelling         Skilled treatment time (min)       Progress towards goals: Increased hip strengths, decreased knee/back pain.    Education: HEP given for PFM contractions H5 /R 10    Assessment: Tolerated all well except L knee pain with wall sits.    Plan: review PFM ex next session, and check 6 min walk, TUG, Berg, 'near tandem'balance'  Next sessions to focus on PFM rehab.  Pt to bring Bladder Diary.  Progress Note to MD next visit.

## 2012-09-01 ENCOUNTER — Encounter: Admit: 2012-09-01 | Discharge: 2012-09-01 | Payer: PRIVATE HEALTH INSURANCE

## 2012-09-01 DIAGNOSIS — G35 Multiple sclerosis: Secondary | ICD-10-CM

## 2012-09-01 NOTE — Unmapped (Addendum)
PT Daily Note  Diagnosis:Pelvic floor dysfunction, MS         Precautions:   Date of initial evaluation: 07/14/12 Date of last progress note:   Payor: [place]  Plan: [place]  Product Type: HMO     # visits authorized per insurance:30              # visits per POC 16  Goals   Reduce knee/LBP to 2-3/10 to improve ambul and ADL tolerance   Increase 6 min walk with cane to 850', and TUG to 12 sec  Berg Score 53/56   Able to sustain 'near tandem' position with heel to heel distance of 2-3   Indep HEP   See separate Pelvic POC (per Lowella Petties MD)     Pelvic Dysfunction Goals:  Increased strength of PFM to 3+/5, hips to 4+/5  Improved PFM endurance to 10 sec with good quality/ isolation of movement  Reduce PFM pain / trigger points to  3 /10  Normalize PFM tone / improved relaxation ability  Good core support to maintain correct postural align during ex and ADL's  Able to defer urination for 15 minutes/ UI only 2x/month  Demonstrates good knowledge of self-help/pain management techniques    Subjective:   Walked on track this am for 5-10 min    Knee pain 1/10 for last 5 days. Back pain has been min to none. Ex at least 2-3x/day for short periods which has been helpful. Notes decreased SOB with stair climbing.       Pain: 0/10    Description:  Location: knee    Objective:   Berg balance 52/56 (incr 1);  FGA 23/30  TUG 10 sec w/o cane  6 minute walk 1020' with light use of cane, mild/mod fatigue reported    Date: 08/07/12  [place]  09/01/12   Visit #:     / POC) #6 #7   Therapeutic Exercises To improve functional strength, endurance, flexibility, ROM    # artic bridging x10    # Supine core stab with sktc lifts x10    # Seated marching with core stab x30    # Wall sits H 10 x5; c/o incr L knee pain    # SL hip abd x10    Neuromuscular Re-education To improve balance, coordination, WB tolerance    # Tandem balance with cerv rot / UTR  See berg   Standing balance with foot slide on washcloth L LBP when L WB     Seated hip add/abd  isom with PFM contraction x5 H10    Therapeutic Activities To improve functional activity independence, safety      See tests above   Gait Training To improve mobility independence, safety,endurance      See tests above   Manual Therapy To restore joint or tissue mobility; reduce pain                   Modalities To reduce pain, inflammation, swelling         Skilled treatment time (min)  44 min      Progress towards goals: see assessment below    Education: Discussed PT f/u options of walking, ex, aquatic, HEP for ongoing progress/maint.    Assessment: Good increase TUG, 6 min walk, decr knee pain, sl incr Berg. Reports decreased reliance on cane.      Plan: review PFM ex next session  Next sessions to focus on PFM rehab.  Pt to  bring Bladder Diary.  Progress Note to MD .

## 2012-09-04 NOTE — Unmapped (Addendum)
Physical Therapy Progress Note    Patient Name: Melanie [name]     ZO#:[id number]   DOB: 1956/08/03  Date: 09/04/2012  Audelia Hives CNP / Hulan Fess MD                               Lowella Petties MD  Diagnosis:   1. Pelvic floor dysfunction    2. Gait abnormality    3. Overactive bladder    4. Multiple sclerosis      # of visits completed:    6   # cancels/ no shows: 1    Goals for MS / Gait Abnormality Current Status   Reduce knee/LBP to 2-3/10 to improve ambul and ADL tolerance GOAL MET - reports no knee pain lately, LBP minimal   Increase 6 min walk with cane to 850', and TUG to 12 sec  Berg Score 53/56 GOAL MET -  6 Minute Walk 1020', decreased c/o SOB & reliance on cane reported.   TUG now 10 sec  Berg Score increased to 52/56   Able to sustain 'near tandem' position with heel to heel distance of 2-3 GOAL MET   Indep HEP GOAL MET   Goals for Pelvic Floor Dysfunction:    Increased strength of PFM to 3+/5, hips to 4+/5 No change yet, PFM 2/5   Improved PFM endurance to 10 sec with good quality/ isolation of movement No change yet, 3 sec endurance   Reduce PFM pain / trigger points to  3 /10  Normalize PFM tone / improved relaxation ability GOAL MET- Rates pain 3/10 with palpation of PFM; resting tone WNL   Good core support to maintain correct postural align during ex and ADL's Progressing with HEP   Able to defer urination for 15 minutes/ UI only 2x/month Progressing - reports UI only 2x/month; Variable deferral ability   Demonstrates good knowledge of self-help/pain management techniques and indep HEP Limited carry over yet     Medication Reviewed: [x]  yes [] no    Assessment:Good increase TUG, 6 min walk, decr knee pain, sl incr Berg. Reports decreased reliance on cane. Pelvic floor weakness not significantly changed yet, but will now increase focus of PT sessions on PF rehab.  Pt to continue HEP and community aquatics / ex classes to maintain / increase progress with gait/ balance/ LE strength.       Rehabilitation Potential:  good        Recommendations and Treatment Plan:   Continue with current goals and POC for pelvic floor rehab 1x/week for 4-6 weeks  Continue to update and progress home exercise program    Physical Therapist Signature:  Ananda Sitzer P Adanely Reynoso       Date:  09/04/2012  _______________________________________________________________________    Physician Certification  I certify that the above patient is under my care an requires the above services. These professional services are to be provided from an established plan, related to the diagnosis and reviewed by me every 90 days.   Additional comments/revisions:     Physician Name: Audelia Hives CNP / Hulan Fess MD                               Lowella Petties MD    Signature___________________________________________________ Date:_______

## 2012-09-06 ENCOUNTER — Encounter: Admit: 2012-09-06 | Discharge: 2012-09-06 | Payer: PRIVATE HEALTH INSURANCE

## 2012-09-06 DIAGNOSIS — M6289 Other specified disorders of muscle: Secondary | ICD-10-CM

## 2012-09-06 NOTE — Unmapped (Signed)
PT Daily Note  Diagnosis:Pelvic floor dysfunction, MS         Precautions:   Date of initial evaluation: 07/14/12 Date of last progress note:   Payor: [place]  Plan: [place]  Product Type: HMO     # visits authorized per insurance:30              # visits per POC 16  Goals   Reduce knee/LBP to 2-3/10 to improve ambul and ADL tolerance   Increase 6 min walk with cane to 850', and TUG to 12 sec  Berg Score 53/56   Able to sustain 'near tandem' position with heel to heel distance of 2-3   Indep HEP   See separate Pelvic POC (per Lowella Petties MD)     Pelvic Dysfunction Goals:  Increased strength of PFM to 3+/5, hips to 4+/5  Improved PFM endurance to 10 sec with good quality/ isolation of movement  Reduce PFM pain / trigger points to  3 /10  Normalize PFM tone / improved relaxation ability  Good core support to maintain correct postural align during ex and ADL's  Able to defer urination for 15 minutes/ UI only 2x/month  Demonstrates good knowledge of self-help/pain management techniques    Subjective:   States fatigued today. Helping daughter yest who had some surgery.       Pain: 0/10    Description:  Location: knee    Objective:   Used power scooter to come into PT today.    Date: 08/07/12  [place]  09/01/12 09/06/12   Visit #:     / POC) #6 #7 #8     Therapeutic Exercises To improve functional strength, endurance, flexibility, ROM     # artic bridging x10     # Supine core stab with sktc lifts x10     # Seated marching with core stab x30     # Wall sits H 10 x5; c/o incr L knee pain     # SL hip abd x10     Neuromuscular Re-education To improve balance, coordination, WB tolerance     # Tandem balance with cerv rot / UTR  See berg    Standing balance with foot slide on washcloth L LBP when L WB      Seated hip add/abd isom with PFM contraction x5 H10     SEMG monitor of PFM isometrics  Sustained holds  Quick contractions   See Assessment    H5 x10, 3 sets  H2 x10   Therapeutic Activities To improve functional activity  independence, safety       See tests above    Gait Training To improve mobility independence, safety,endurance       See tests above    Manual Therapy To restore joint or tissue mobility; reduce pain     Pelvic floor muscle palpation, soft tissue mobiliz, TP accupressure   See assessment below               Modalities To reduce pain, inflammation, swelling           Skilled treatment time (min)  44 min       Progress towards goals: see assessment below    Education: Discussed PT f/u options of walking, ex, aquatic, HEP for ongoing progress/maint.    Assessment: Pt still rates PFM pain 2-3/10 with palpation, worse with penetration, espec on R side.  Pt demonstrated much facial grimacing with insertion and removal of examining finger.  Would benefit from instruction in use of vaginal dilator to reduce spasm with insertion and to reduce TP sensitivity.  Strength of PFM contraction 3/5, but endurance still max of 3-5 sec.           With SEMG monitoring of PFM: contraction to 4-5uV maximum, sustained for 4-5 seconds.  Resting level supine 1.0, but seated was 2.0uV.  Able to perform quick contractions also from 2-4.5uV with relatively good consistency.     Plan: review PFM ex next session and provide information on use of dilator for self STM to tender points and reduce spasm with insertion.  Pt to bring Bladder Diary.

## 2012-09-07 MED ORDER — baclofen (LIORESAL) 10 MG tablet
10 | ORAL_TABLET | ORAL | Status: AC
Start: 2012-09-07 — End: 2013-09-24

## 2012-09-07 NOTE — Unmapped (Signed)
90 supple called in and patient added to list to see Dr. Genelle Gather

## 2012-09-07 NOTE — Unmapped (Signed)
Pt is calling back asking that Baclofen 10 mg be called in as a 3 month supply, [facility] fax # [phone]

## 2012-09-14 NOTE — Unmapped (Signed)
No show at time of appt.

## 2012-09-21 ENCOUNTER — Ambulatory Visit: Admit: 2012-09-21 | Discharge: 2012-09-21 | Payer: PRIVATE HEALTH INSURANCE

## 2012-09-21 DIAGNOSIS — M6289 Other specified disorders of muscle: Secondary | ICD-10-CM

## 2012-09-21 NOTE — Unmapped (Signed)
PT Daily Note  Diagnosis:Pelvic floor dysfunction, MS         Precautions:   Date of initial evaluation: 07/14/12 Date of last progress note:   Payor: [place]  Plan: [place]  Product Type: HMO     # visits authorized per insurance:30              # visits per POC 16  Goals   Reduce knee/LBP to 2-3/10 to improve ambul and ADL tolerance   Increase 6 min walk with cane to 850', and TUG to 12 sec  Berg Score 53/56   Able to sustain 'near tandem' position with heel to heel distance of 2-3   Indep HEP   See separate Pelvic POC (per Lowella Petties MD)     Pelvic Dysfunction Goals:  Increased strength of PFM to 3+/5, hips to 4+/5  Improved PFM endurance to 10 sec with good quality/ isolation of movement  Reduce PFM pain / trigger points to  3 /10  Normalize PFM tone / improved relaxation ability  Good core support to maintain correct postural align during ex and ADL's  Able to defer urination for 15 minutes/ UI only 2x/month  Demonstrates good knowledge of self-help/pain management techniques    Subjective:   States frustrated with various difficulties this AM and in last few days with home issues.       Pain: 0/10    Description:  Location:     Objective:   Pt 20 min late to appt.    Date: 08/07/12  [place]  09/01/12 09/06/12 09/21/12   Visit #:     / POC) #6 #7 #8   #9     Therapeutic Exercises To improve functional strength, endurance, flexibility, ROM      # artic bridging x10      # Supine core stab with sktc lifts x10      # Seated marching with core stab x30      # Wall sits H 10 x5; c/o incr L knee pain      # SL hip abd x10      Neuromuscular Re-education To improve balance, coordination, WB tolerance      # Tandem balance with cerv rot / UTR  See berg     Standing balance with foot slide on washcloth L LBP when L WB       Seated hip add/abd isom with PFM contraction x5 H10      SEMG monitor of PFM isometrics  Sustained holds  Quick contractions   See Assessment    H5 x10, 3 sets  H2 x10 See assessment  H5 x15    Therapeutic Activities To improve functional activity independence, safety        See tests above     Gait Training To improve mobility independence, safety,endurance        See tests above     Manual Therapy To restore joint or tissue mobility; reduce pain      Pelvic floor muscle palpation, soft tissue mobiliz, TP accupressure   See assessment below See below;   15 min   Education in dilator use    20 min          Modalities To reduce pain, inflammation, swelling             Skilled treatment time (min)  44 min   45     Progress towards goals: see assessment below    Education: Spent 20  minutes educating pt in how to safely & properly use vaginal dilator for self PFM stretches, accupressure and relaxation of muscles with penetration.  Showed sample dilators to pt. & provided written instructions for same, as well as for bladder training program and PFM ex.      Assessment: Pt bladder diary was orig completed 2 mo ago.  She states she recently has increased urgency, occassional UI and nocturia (0-1x) due to running out of one of her bladder meds. She will be able to refill this Rx soon. States has improved fluid intake from 3 to 4-5 glasses / day (cued to increase).  States typically still has average voiding freq of every 2 hrs in daytime.             Pt still rates PFM pain 3/10 with palpation, worse with penetration. Reports she has never used a tampon during her life.   Pt demonstrated much facial grimacing with insertion and removal of examining finger. She expressed anxiety regarding use of vaginal dilator, but appeared to understand goal of reducing spasm with insertion and TP sensitivity.             Strength of PFM contraction 3/5, but endurance still max of 3-5 sec.           With SEMG monitoring of PFM: contraction to 4-5uV maximum, sustained for 3-4 seconds.  Resting level supine .05 -1.0uV.      Plan:Pt will need to consider ins coverage as may be changing her policy. Requests PT on hold for  now.  She will call by mid October to inform PT of decision, whether will be able to continue sessions.

## 2012-09-27 NOTE — Unmapped (Signed)
Is needing a letter stating pt has MS. Pt is stating she will explain more to you when you call her back.

## 2012-09-27 NOTE — Unmapped (Signed)
Spoke to pt.  She needs 3 letters about having a chronic illness.  Three letters printed and will obtain Dr. Dalbert Garnet signature on them tomorrow.  Patient will pick them up tomorrow afternoon.

## 2012-09-29 NOTE — Unmapped (Signed)
Appt today was to have been deleted, and was left on schedule in error.

## 2012-10-04 NOTE — Unmapped (Signed)
Patient called.  Stated that she is currently taking oxybutynin, but doesn't have insurance anymore.  Pt.questions if she can have a less expensive prescription called in, or if she can have some samples?

## 2012-10-04 NOTE — Unmapped (Signed)
Patient stated, w/ins.its $38. W/o ins.its $60.  the bottle says oxybutynin 10mg   On 05/04/12 note it says, increase oxybutynin to 20mg  daily

## 2012-10-04 NOTE — Unmapped (Signed)
oxybutynin is the cheapest anticholinergic on the market.  How much is it costing her and are we sure she is taking generic?

## 2012-10-10 NOTE — Unmapped (Signed)
I don't think we can make it cheaper than that.  That is the cheapest med on the market.

## 2012-10-11 NOTE — Unmapped (Signed)
contacted patient and informed that oxybutynin is the cheapest anticholingeric.  Pt.stated that she has new insurance now and would like a 90 days prescription.  Pt.stated that she will check with her pharmacy to see how much it'll cost her, and then will call us back

## 2012-10-17 NOTE — Unmapped (Signed)
Dustin Flock-    Please tell her that is fine with me but the short-acting has a higher side-effect profile than the extended release and it is more inconvenient to take it four times per day.  But, I have no problem with her trying it if she would like.

## 2012-10-17 NOTE — Unmapped (Signed)
Patient wants to try the 5mg . How many to dispense?  Pt.questions if she take two 5mg , will it give her the same effect as taking one 10mg  xl?

## 2012-10-17 NOTE — Unmapped (Signed)
Patient contacted the office to ask that a new prescription for her Oxybutynin CL be written.  She would like a prescription for a 5 mg taking 4 a day.  This is because the 10 mg tablet taking 2 a day cost 100 dollars as opposed to 16 dollars for the 5 mg tablets.

## 2012-10-18 MED ORDER — oxybutynin (DITROPAN) 5 MG tablet
5 | ORAL_TABLET | Freq: Four times a day (QID) | ORAL | Status: AC
Start: 2012-10-18 — End: 2013-02-27

## 2012-10-18 NOTE — Unmapped (Signed)
No I don't believe that is the case.  I would recommend she speak with the pharmacist when she picks up the meds.  He/she can probably give her a good idea of drug action etc.  thanks

## 2012-10-18 NOTE — Unmapped (Signed)
How many tablets to dispense?

## 2012-10-18 NOTE — Unmapped (Signed)
If she takes 4 tablets per day that would be 120 for a month

## 2012-11-02 MED ORDER — gabapentin (NEURONTIN) 300 MG capsule
300 | ORAL_CAPSULE | Freq: Three times a day (TID) | ORAL | Status: AC
Start: 2012-11-02 — End: 2013-09-24

## 2012-11-02 NOTE — Unmapped (Signed)
Refill on Gabapentin 300 mg, [facility] telephone # [phone]. Pt is completely out. Pt is wanting a call to let her know when she can go pick up medication.

## 2012-11-09 MED ORDER — interferon beta-1a (REBIF) 44 mcg/0.5 mL injection
44 | SUBCUTANEOUS | 3.00 refills | 28.00000 days | Status: AC
Start: 2012-11-09 — End: 2013-05-18

## 2012-11-20 ENCOUNTER — Ambulatory Visit: Admit: 2012-11-20 | Discharge: 2012-11-22

## 2012-11-20 ENCOUNTER — Encounter

## 2012-12-05 NOTE — Unmapped (Signed)
No further contact from pt since 09/21/12. She was to call PT re., continued appts due to insurance change, but did not call back.  Will D/C PT now.    Physical Therapy Discharge Summary  [facility] CNP / Hulan Fess MD                               Lowella Petties MD  Patient Name: Melanie [name]    ZO#:[id number]   DOB: [birth year]  Date: 12/05/2012  Diagnosis:   1. Pelvic floor dysfunction    2. Gait abnormality    3. Overactive bladder    4. Multiple sclerosis      # of visits completed:    9  # cancels/ no shows: see below    Goals for MS / Gait Abnormality Current Status   Reduce knee/LBP to 2-3/10 to improve ambul and ADL tolerance GOAL MET - reports no knee pain lately, LBP minimal   Increase 6 min walk with cane to 850', and TUG to 12 sec  Berg Score 53/56 GOAL MET -  6 Minute Walk 1020', decreased c/o SOB & reliance on cane reported.   TUG now 10 sec  Berg Score increased to 52/56   Able to sustain 'near tandem' position with heel to heel distance of 2-3 GOAL MET   Indep HEP GOAL MET   Goals for Pelvic Floor Dysfunction:    Increased strength of PFM to 3+/5, hips to 4+/5 Improved to PFM 3/5   Improved PFM endurance to 10 sec with good quality/ isolation of movement No signif change yet,  3-5 sec endurance   Reduce PFM pain / trigger points to  3 /10  Normalize PFM tone / improved relaxation ability GOAL MET- Rates pain 3/10 with palpation of PFM; resting tone WNL   Good core support to maintain correct postural align during ex and ADL's Progressing with HEP   Able to defer urination for 15 minutes/ UI only 2x/month Progressing - reports UI only 2x/month with meds; Variable deferral ability; Freq: every 2 hrs.   Demonstrates good knowledge of self-help/pain management techniques and indep HEP Limited carry over yet       Assessment:  At last visit on 09/21/12: pt. stated she recently has increased urgency, occassional UI and nocturia (0-1x) due to running out of one of her bladder meds.  States has  improved fluid intake (from 3) to 4-5 glasses / day.         Pt still rates PFM pain 3/10 with palpation, worse with penetration. Reports she has never used a tampon during her life. Pt demonstrated much facial grimacing with insertion and removal of examining finger. She expressed anxiety regarding use of vaginal dilator, but appeared to understand goal of reducing spasm with insertion and TP sensitivity.         Goals not fully met, and pt had to end PT sessions due to apparent ins issue.    Recommendations and Treatment Plan:   Pt has been discharge from PT care at this time due to:  [x]  patient request / insurance change    Physical Therapist Signature:  Maximino Sarin       Date:  12/05/2012

## 2012-12-31 MED ORDER — metFORMIN (GLUCOPHAGE) 500 MG tablet
500 | ORAL_TABLET | ORAL | Status: AC
Start: 2012-12-31 — End: 2014-02-25

## 2013-02-27 ENCOUNTER — Inpatient Hospital Stay: Admit: 2013-02-27 | Attending: Neurology

## 2013-02-27 ENCOUNTER — Ambulatory Visit: Admit: 2013-02-27 | Discharge: 2013-02-27 | Payer: PRIVATE HEALTH INSURANCE | Attending: Neurology

## 2013-02-27 DIAGNOSIS — F09 Unspecified mental disorder due to known physiological condition: Secondary | ICD-10-CM

## 2013-02-27 DIAGNOSIS — G35 Multiple sclerosis: Secondary | ICD-10-CM

## 2013-02-27 LAB — CBC
Hematocrit: 41.6 % (ref 35.0–45.0)
Hemoglobin: 13.8 g/dL (ref 11.7–15.5)
MCH: 30.1 pg (ref 27.0–33.0)
MCHC: 33.1 g/dL (ref 32.0–36.0)
MCV: 91.1 fL (ref 80.0–100.0)
MPV: 9.6 fL (ref 7.5–11.5)
Platelets: 237 10*3/uL (ref 140–400)
RBC: 4.57 10*6/uL (ref 3.80–5.10)
RDW: 13.6 % (ref 11.0–15.0)
WBC: 8.7 10*3/uL (ref 3.8–10.8)

## 2013-02-27 LAB — DIFFERENTIAL
Basophils Absolute: 26 /uL (ref 0–200)
Basophils Relative: 0.3 % (ref 0.0–1.0)
Eosinophils Absolute: 183 /uL (ref 15–500)
Eosinophils Relative: 2.1 % (ref 0.0–8.0)
Lymphocytes Absolute: 2915 /uL (ref 850–3900)
Lymphocytes Relative: 33.5 % (ref 15.0–45.0)
Monocytes Absolute: 766 /uL (ref 200–950)
Monocytes Relative: 8.8 % (ref 0.0–12.0)
Neutrophils Absolute: 4811 /uL (ref 1500–7800)
Neutrophils Relative: 55.3 % (ref 40.0–80.0)

## 2013-02-27 LAB — HEPATIC FUNCTION PANEL, SERUM
ALT: 60 U/L (ref 13–69)
AST (SGOT): 49 U/L (ref 11–53)
Albumin: 4.3 g/dL (ref 3.6–5.1)
Alkaline Phosphatase: 105 U/L (ref 33–130)
Bilirubin, Direct: 0 mg/dL (ref 0.0–0.2)
Total Bilirubin: 0.2 mg/dL (ref 0.2–1.2)
Total Protein: 7.7 g/dL (ref 6.2–8.3)

## 2013-02-27 LAB — VITAMIN D 25 HYDROXY: Vit D, 25-Hydroxy: 25.9 ng/mL (ref 30.0–100)

## 2013-02-27 MED ORDER — methylphenidate (RITALIN) 10 MG tablet
10 | ORAL_TABLET | Freq: Two times a day (BID) | ORAL | Status: AC
Start: 2013-02-27 — End: 2014-04-09

## 2013-02-27 NOTE — Unmapped (Signed)
Date:  02/27/2013  Name: Melanie [name]  Age:  57 y.o.  Referring Physician: [name], MD  [address]  [address]  [place], [place] [address]  PCP:  Simeon Craft, MD  CC:  No chief complaint on file.      Date Symptom   Onset/Outcome Brain MRI Spine MRI Tests DMT period A/E Meds   2000 Fatigue, cognitive G/P        2004 Blurry vision B/L G/I  Dx MS    Rebif IVMP   2013  Stable 04-now  PV -ve                                 Onset: Acute/Gradual , Outcome: Resolved/Improved/Progressed, MRI: Peri Ventricular/Juxta Cortical,      HPI:    Melanie [name] is a 57 y.o. right handed female who came to Multiple Sclerosis clinic today. Below is what I have gathered from patient and chart review.    She was working as Psychologist, educational at [employer]. She was feeling very tired with some cognitive and memory problem around 2000. Thne she had an episode of visual change in both eye in 2004 with partial recovery. She was Dx with MS based on MRI. She was started on Rebif . She thinks it works and tolerating it well however does not like injection. Her last MRI was from April 2013.   Last year she lost 25 Lbs by diet and exercise after she was diagnosed with Diabetes but she regain that weight. She is planning to go back in diet this week.     I reviewed Dr. 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 Pt is currently on Rebif and is tolerating it well. She has not had an MRI since January 2012. Since pts last visit she feels her condition is getting worse. She is experiencing cognitive issues including concentration and memory issues. She has also started to urinate uncontrollably and has missed work as a result. Pt is having trouble walking due to pain in both knees and L hip pain. She also complains of stress at work which causes her to be depressed.   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 Pt is currently on Rebif and is tolerating it well. She has not had an MRI since January 2012. Since pts last visit she feels her condition is getting worse. She is experiencing cognitive issues including concentration and memory issues. She has also started to urinate uncontrollably and has missed work as a result. Pt is having trouble walking due to pain in both knees and L hip pain. She also complains of stress at work which causes her to be depressed.   Due to pts complaints of cognitive issues, we will refer her for a neuropsych eval. We will also give her the contact number to the social worker for the [facility]. I would like to obtain and MRI of the brain and c spine.      Review of System:    All 14 item review of systems was unremarkable except as noted below:  STM   Main symptom she get disability   Urinary Incontinece Oxybutinin helps, she has done pelvic exercise, this was one the reason she stopped working  Walking Difficulty Tired and back pain, can do more after exercise   Imbalance  Trip over but no fall     Medications:      Current Outpatient Prescriptions   Medication Sig   ??? albuterol Inhale 2 puffs into the lungs. Every 4 to 6 hours as needed.    ??? atorvastatin Take 1 tablet (10 mg total) by mouth daily.   ??? baclofen TAKE ONE TABLET BY MOUTH THREE TIMES A DAY   ??? blood sugar diagnostic Test blood sugars up to twice a day   ??? blood-glucose meter Use as instructed   ??? blood-glucose meter Use as instructed   ??? cholecalciferol (vitamin D3) Take 1 tablet (1,000 Units total) by mouth 2 times a day as needed.   ??? gabapentin Take 1 capsule (300 mg total) by mouth 3 times a day.   ??? ibuprofen Take 800 mg by mouth every 8 hours as needed.   ??? inhalational spacing device by Misc.(Non-Drug; Combo Route) route. AEROCHAMBER PLUS.  Use with MDI    ??? interferon beta-1a Inject 0.5 mLs (44 mcg total) subcutaneously every Monday, Wednesday, and Friday.   ??? metFORMIN TAKE ONE TABLET BY MOUTH TWICE A DAY WITH  MEALS   ??? modafinil Take 1 tablet (200 mg total) by mouth every morning.   ??? oxybutynin Take two tablets by mouth daily   ??? [DISCONTINUED] estradiol Use one tablet daily for two weeks and then twice per week after that.   ??? [DISCONTINUED] LORazepam Take 1 tablet (1 mg total) by mouth as needed. 1 po 1 hour before MRI then 1 right before scan   ??? [DISCONTINUED] milnacipran 50 mg. Take 1 Tab by mouth 2 times daily.   ??? [DISCONTINUED] oxybutynin Take 1 tablet (5 mg total) by mouth 4 times a day.     No current facility-administered medications for this visit.      Allergies: Compazine and Prochlorperazine    History:    PMHx:  has a past medical history of Asthma; Multiple sclerosis; Hypertension; and Diabetes mellitus.     PSHx:  has past surgical history that includes Knee surgery; Appendectomy; and Hysterectomy.    Social Hx: reports that she has never smoked. She has never used smokeless tobacco. She reports that  drinks alcohol. She reports that she does not use illicit drugs. She lives by herself. She feels she is safe but has to take extra time. Feels depressed being lonely. Grandkids lives cross the street and come and visit her. She goes to [facility], do dance club and try to stay active.     ZOX:WRUEAV history includes Alcohol abuse in her father and other; Brain cancer in her other; COPD in her father; Cancer in her father, maternal grandmother, mother, and sister; Diabetes in her daughter, paternal aunt, paternal grandfather, and paternal uncle; Skin cancer in her other; Stomach cancer in her other; and Throat cancer in her other.    Physical Exam:    BP 126/82   Pulse 88   Ht 5' 4 (1.626 m)   Wt 229 lb (103.874 kg)   BMI 39.29 kg/m2    General:   Obese, NAD, no skin rash, breath regularly    Neurologic  Mental Status:Patient registered 3 objects without difficulty and recalled 3/3 after five minutes.  The patient was fully oriented and had a normal fund of knowledge.    Cranial Nerves: Color Vision12/12 OD and  10/12 OS, best corrected visual acuity was 20/20 OD and  20/20 OS.  There was no afferent pupillary deficit.  Funduscopic exam revealed normal optic nerve heads.   Horizontal and vertical eye movements were full.  There was no nystagmus.  Facial sensation was intact to pinprick.  There was no facial weakness.  Hearing was intact to finger rubbing bilaterally. Palate moved fully and symmetrically.  Tongue protruded in the midline.  There was no dysarthria.    Motor:   Strength:R-L (MRC Scale)   Deltoid  5 - 5   Hip Fx   5 - 5  Biceps   5 - 5   Knee Ex  5 - 5   Triceps  5 - 5   Knee Fx  5 - 5   Wrist Ex  5 - 5   Plantar Fx 5 - 5   Grip         5 - 5   Ankle dorsiflex 5 - 5    Tone was normal    Reflexes: R / L (MRC Scale)   Biceps  2 / 2   Quadriceps 2 / 2   Triceps  2 / 2   Achilles  2 / 2   Brachoradialis 2 / 2   Toes   Up / down       Coordination: There was no ataxia on finger to nose or heel to shin testing.    Sensation: decreased vibration B/L.  position and pinprick sensation were intact in all four extremities.    Gait and station:  Able to do tandem walk and hopping on either foot.   T25-FW date Time assistance   02/26/13  6 No   Tests:    I personally reviewed 03/27/2012 MRI and agree with findings:  Barin: Moderate plaque burden within the white matter as described above, consistent with patient's history of multiple sclerosis. No new lesions are present.   C-spine: no cord lesion         No results found for this basename: [id number]     Blood work 03/30/12:  CMP was WNL    Assessment/Plan:    Multiple Sclerosis with cognitive and urinary symptoms as main complain. She has remained fairly stable clinically and based on MRI for the last several years.     1. Multiple sclerosis  - Rebif, she is happy with the result  - CBC, LFT  - Refused MRI as she feels she is stable       2. Cognitive impairment  - Make the list   - Encouraged to increased social interaction   - methylphenidate (RITALIN) 10 MG tablet; Take 0.5  tablets (5 mg total) by mouth 2 times a day.  Dispense: 60 tablet; Refill: 0    3. Fatigue  - methylphenidate (RITALIN) 10 MG tablet; Take 0.5 tablets (5 mg total) by mouth 2 times a day.  Dispense: 60 tablet; Refill: 0    4. Vitamin D deficiency  - Vitamin D 25 hydroxy; Future    5. Ocesity   - Counseled about importance of low fat diet in MS. She would like to take medication for weight loss, I encouraged her to stay more active in and out side the home rather than taking diet pill.    Medical Decision Making:   * review/order clinical lab tests   * review/order radiology tests   * review/order other diagnostic or tx interventions   * obtain old records or history from another person   * review and summarization of old records   *  independent review of data- e.g. image- specimen   * Permanent chart problem/surgery list reviewed   * Permanent chart chronic med/ allergy list reviewed   * Permanent chart social/family history reviewed   * reviewed films     Risks & Benefits:   * Risks, benefits and treatment options discussed with patient.   Risk of complications: moderate   Time spent with patient:   New: 60 min   More than 50% of this time was spent discussing:   * Diagnosis   * Management   * Treatment Plan   * Diagnostic Results as indicated above    Raeanne Gathers, MD  Assistant Professor of Neurology   Director of [facility]   [address]   [place], [place] [address]????  [phone]

## 2013-02-27 NOTE — Unmapped (Addendum)
Plan:  Blood work, Vitamin D level   Vitamin 2000 IU daily   No MRI   Return in a year     Life style modification that can change Multiple Sclerosis out come:   1. Low fate diet: read Dr. Gareth Morgan book The Multiple Sclerosis Diet Book  or Dr. Peggye Ley book The Starch Solution or watch Forks over Knives  2. Exercise (swimming, yoga)  3. Avoid alcohol and smoking

## 2013-02-28 ENCOUNTER — Ambulatory Visit: Admit: 2013-02-28

## 2013-02-28 NOTE — Unmapped (Signed)
Left message to patient to sign up for [place], however in meantime  Vitamin d ?

## 2013-02-28 NOTE — Unmapped (Signed)
Pt saw Dr Genelle Gather yesterday and she did her lab work, and would like to know that her results are fine? What was her Vitamin D levels?

## 2013-03-01 MED ORDER — ergocalciferol (ERGOCALCIFEROL) 50,000 unit capsule
1250 | ORAL_CAPSULE | ORAL | Status: AC
Start: 2013-03-01 — End: 2014-04-09

## 2013-03-01 NOTE — Unmapped (Signed)
Vit D ~25,9 on 02/27/2013    Vitamin D deficiency has been associated with MS disease. The goal for Vitamin D in MS patient is above 50. I will prescribe you Vitamin D 50,000 IU weekly for three month. If the gaol level is reached when we recheck the level after three months you can continue with over the counter Vitamin D 2000 IU daily. Vitamin D deficiency also can be managed by your PCP help.

## 2013-03-02 MED ORDER — lisinopril-hydrochlorothiazide (PRINZIDE,ZESTORETIC) 20-25 mg per tablet
20-25 | ORAL_TABLET | ORAL | 0.00 refills | 90.00000 days | Status: AC
Start: 2013-03-02 — End: 2013-12-18

## 2013-03-02 NOTE — Unmapped (Signed)
Returning call.

## 2013-03-02 NOTE — Unmapped (Signed)
Left message to call office

## 2013-03-02 NOTE — Unmapped (Signed)
Patient advised to take 50,000 units and 2000 units daily when completed will repeat vitamin D level

## 2013-05-03 NOTE — Unmapped (Signed)
Spoke with patient she will bring in forms to be completed.  Will fax medical records release to [facility]-phone call completed

## 2013-05-03 NOTE — Unmapped (Signed)
Pt wanting to speak with MA regarding disability.

## 2013-05-18 MED ORDER — interferon beta-1a (REBIF, WITH ALBUMIN,) 44 mcg/0.5 mL injection
44 | SUBCUTANEOUS | 3.00 refills | 28.00000 days | Status: AC
Start: 2013-05-18 — End: 2014-02-13

## 2013-05-22 NOTE — Unmapped (Signed)
E scribed-completed

## 2013-09-03 ENCOUNTER — Encounter

## 2013-09-12 ENCOUNTER — Ambulatory Visit: Admit: 2013-09-12

## 2013-09-24 NOTE — Unmapped (Signed)
Per the pt, she needs a refill for gabapentin and another one that she doesn't know the name of it. Please call into [facility] on file.

## 2013-09-25 MED ORDER — baclofen (LIORESAL) 10 MG tablet
10 | ORAL_TABLET | Freq: Every day | ORAL | Status: AC
Start: 2013-09-25 — End: 2013-10-02

## 2013-09-25 MED ORDER — gabapentin (NEURONTIN) 300 MG capsule
300 | ORAL_CAPSULE | Freq: Three times a day (TID) | ORAL | Status: AC
Start: 2013-09-25 — End: 2013-09-25

## 2013-09-25 MED ORDER — gabapentin (NEURONTIN) 300 MG capsule
300 | ORAL_CAPSULE | Freq: Three times a day (TID) | ORAL | Status: AC
Start: 2013-09-25 — End: 2014-09-24

## 2013-09-25 NOTE — Unmapped (Signed)
Rx called into pharmacy.

## 2013-09-25 NOTE — Unmapped (Signed)
E scribed-complete

## 2013-10-02 NOTE — Unmapped (Signed)
The patient states her Baclofen was sent to the pharmacy for 10 mg, 1 tab daily however, she takes it TID. It was sent for a quantity of 90, which is enough for a month supply. However, she is wanting to make sure she is still supposed this is the correct dose? She can be reached back at [phone]. She is also wanting to know why she only had two refills on the prescription. She is requesting a six month supply.

## 2013-10-03 MED ORDER — baclofen (LIORESAL) 10 MG tablet
10 | ORAL_TABLET | Freq: Three times a day (TID) | ORAL | Status: AC
Start: 2013-10-03 — End: 2014-03-25

## 2013-10-03 NOTE — Unmapped (Signed)
E scribed to pharmacy-complete

## 2013-12-12 ENCOUNTER — Ambulatory Visit: Admit: 2013-12-12

## 2013-12-18 MED ORDER — lisinopril-hydrochlorothiazide (PRINZIDE,ZESTORETIC) 20-25 mg per tablet
20-25 | ORAL_TABLET | Freq: Every day | ORAL | 0.00 refills | 90.00000 days | Status: AC
Start: 2013-12-18 — End: 2014-02-25

## 2013-12-18 NOTE — Unmapped (Signed)
Advised and will schedule

## 2013-12-18 NOTE — Unmapped (Signed)
One month given, make appt

## 2013-12-18 NOTE — Unmapped (Signed)
Lost her medications and does not have a refill for lisinopril-hydrochlorothiazide (PRINZIDE,ZESTORETIC) 20-25 mg per tablet. Please call pharmacy ASAP

## 2013-12-18 NOTE — Unmapped (Signed)
Looks like she has not been here in well over a yr please advise

## 2014-01-22 ENCOUNTER — Encounter

## 2014-02-13 NOTE — Unmapped (Signed)
Needs new scrip for Rebis in order for MS Society to pay for; switching insurance companies. Please call pt.

## 2014-02-13 NOTE — Unmapped (Signed)
Please route back to fax

## 2014-02-14 MED ORDER — interferon beta-1a, albumin, (REBIF, WITH ALBUMIN,) 44 mcg/0.5 mL injection
44 | SUBCUTANEOUS | 3.00 refills | 28.00000 days | Status: AC
Start: 2014-02-14 — End: 2015-10-29

## 2014-02-14 NOTE — Unmapped (Signed)
Spoke with patient. Relayed message below. Was very thankful MA was taking care of her call so quickly.

## 2014-02-14 NOTE — Unmapped (Signed)
Spoke with MS lifeline [phone] and was given fax number [phone] to fax Rx.

## 2014-02-25 MED ORDER — lisinoprilhydrochlorothiazidePRINZIDEZESTORETIC2025mgpertablet
20-25 | ORAL_TABLET | Freq: Every day | ORAL | 3.00 refills | 90.00000 days | Status: AC
Start: 2014-02-25 — End: 2017-07-06

## 2014-02-25 MED ORDER — metFORMIN (GLUCOPHAGE) 500 MG tablet
500 | ORAL_TABLET | Freq: Two times a day (BID) | ORAL | Status: AC
Start: 2014-02-25 — End: 2019-11-06

## 2014-02-25 NOTE — Unmapped (Signed)
I can't refer her because it all depends on who takes her insurance. She'll have to look at her provider list. If she wants she can fax or send me a copy of the list of docs on her plan and  I can look over that. Sorry she won't be able to come here anymore. I did her refills.

## 2014-02-25 NOTE — Unmapped (Signed)
Pt is on an Obamacare plan that we do not accept ([employer]) so she needs to be referred to a physican that is covered by her new insurance and is someone that AD trusts    Pt needs her lisinopril and metformin refilled to get her thru until she switches to her new pcp

## 2014-02-25 NOTE — Unmapped (Signed)
Patient informed of below

## 2014-03-25 NOTE — Unmapped (Signed)
Spoke with [employer]. Patient has [employer]. Looks like we do not take. Would like to know if office would like to complete a prior auth so patient can continue seeing MD. patient is very concerned, has been seeing MD for quite some time. Please call patient back with decision

## 2014-03-25 NOTE — Unmapped (Signed)
Pt needs new scrip for baclofen sent to [facility] for 30 days/90 tablets/takes three times a day.  States it was to be sent when she made appt for yearly followup.  Is out of med. Please advise pt when sent.

## 2014-03-26 MED ORDER — baclofen (LIORESAL) 10 MG tablet
10 | ORAL_TABLET | Freq: Three times a day (TID) | ORAL | Status: AC
Start: 2014-03-26 — End: 2015-01-02

## 2014-03-26 NOTE — Unmapped (Signed)
Patient advised Rx sent to pharmacy-complete

## 2014-03-26 NOTE — Unmapped (Signed)
Spoke with Myrene Buddy at [employer].  She will fax a form to be completed for patient to continue care with Dr. Georgetta Haber

## 2014-04-01 NOTE — Unmapped (Signed)
States she cannot walk on right leg, can't walk straight, 'giving out' at work.  Please advise pt.

## 2014-04-02 LAB — CBC AND DIFFERENTIAL
Basophils Absolute: 40 cells/uL (ref 0–200)
Basophils Relative: 0.4 %
Eosinophils Absolute: 230 cells/uL (ref 15–500)
Eosinophils Relative: 2.3 %
Hematocrit: 43.5 % (ref 35.0–45.0)
Hemoglobin: 13.9 g/dL (ref 11.7–15.5)
Lymphocytes Absolute: 2410 cells/uL (ref 850–3900)
Lymphocytes Relative: 24.1 %
MCH: 30.1 pg (ref 27.0–33.0)
MCHC: 32 g/dL (ref 32.0–36.0)
MCV: 94.1 fL (ref 80.0–100.0)
Monocytes Absolute: 1050 cells/uL (ref 200–950)
Monocytes Relative: 10.5 %
Neutrophils Absolute: 6270 cells/uL (ref 1500–7800)
Neutrophils Relative: 62.7 %
Platelets: 229 10*3/uL (ref 140–400)
RBC: 4.63 10*6/uL (ref 3.80–5.10)
RDW: 14.2 % (ref 11.0–15.0)
WBC: 10 10*3/uL (ref 3.8–10.8)

## 2014-04-02 LAB — COMPREHENSIVE METABOLIC PANEL
A/G Ratio: 1.1 (calc) (ref 1.0–2.5)
ALT: 72 U/L — ABNORMAL HIGH (ref 6–29)
AST: 60 U/L — ABNORMAL HIGH (ref 10–35)
Albumin: 4.1 g/dL (ref 3.6–5.1)
Alkaline Phosphatase: 80 U/L (ref 33–130)
BUN: 13 mg/dL (ref 7–25)
CO2: 28 mmol/L (ref 19–30)
Calcium: 9.4 mg/dL (ref 8.6–10.4)
Chloride: 96 mmol/L — ABNORMAL LOW (ref 98–110)
Creatinine: 0.69 mg/dL (ref 0.50–1.05)
GFR MDRD Af Amer: 112 mL/min/1.73m2 (ref >=60)
Globulin, Total: 3.6 g/dL (ref 1.9–3.7)
Glucose: 147 mg/dL — ABNORMAL HIGH (ref 65–99)
Potassium: 3.6 mmol/L (ref 3.5–5.3)
Sodium: 136 mmol/L (ref 135–146)
Total Bilirubin: 0.5 mg/dL (ref 0.2–1.2)
Total Protein: 7.7 g/dL (ref 6.1–8.1)
eGFR Non-Afr. American: 97 mL/min/1.73m2 (ref >=60)

## 2014-04-02 LAB — URINALYSIS W/RFL TO MICROSCOPIC
Bacteria, UA: NONE SEEN /HPF
Bilirubin, UA: NEGATIVE
Glucose: NEGATIVE
Hyaline Casts, UA: NONE SEEN /LPF
Ketones, UA: NEGATIVE
Nitrite, UA: NEGATIVE
Occult Blood: NEGATIVE
Specific Gravity: 1.029 (ref 1.001–1.035)
pH, UA: 7.5 (ref 5.0–8.0)

## 2014-04-02 NOTE — Unmapped (Signed)
Patient is requesting her lab orders be faxed to [facility]. Fax # [phone]. Patient is heading to lab now.

## 2014-04-02 NOTE — Unmapped (Signed)
New symptoms can be due to MS exacerbation or secondary to infection/metabolic causes (Psuedo-exacerbation). Recommend:  ?? CBC, d, CMP and UA reflex culture  ?? Call back 2 days after test to discuss next step (steroid or MRI)

## 2014-04-02 NOTE — Unmapped (Signed)
Order faxed to [phone].

## 2014-04-02 NOTE — Unmapped (Signed)
Relayed message to pt and pt will come to [facility] to have labs done.

## 2014-04-02 NOTE — Unmapped (Signed)
Pt c/o difficulty walking since Friday.  Pt feels her right leg gives out.  She states if feels like weights are on her legs.

## 2014-04-04 MED ORDER — methylPREDNISolone sodium succinate (SOLU-MEDROL) 1,000 mg injection
1000 | Freq: Once | INTRAVENOUS | Status: AC
Start: 2014-04-04 — End: 2014-04-04

## 2014-04-04 NOTE — Unmapped (Signed)
Pt is calling for lab results. Please call back

## 2014-04-04 NOTE — Unmapped (Signed)
Patient advised-complete

## 2014-04-04 NOTE — Unmapped (Signed)
CBC, UA did not show infection. Ok to start steroid.   If patient is on DMT (Rebif) she needs to get a MRI Brain > Spine before seeing me in 04/09/14

## 2014-04-05 NOTE — Unmapped (Signed)
Patient will call bacl with the fax number to fax the order for the aquatic therapy

## 2014-04-05 NOTE — Unmapped (Signed)
Please call pt back regarding IV treatment, would also like to try aquatic therapy.

## 2014-04-08 ENCOUNTER — Inpatient Hospital Stay: Admit: 2014-04-08 | Payer: MEDICAID | Attending: Neurology

## 2014-04-08 DIAGNOSIS — G35 Multiple sclerosis: Secondary | ICD-10-CM

## 2014-04-08 MED ORDER — LORazepam (ATIVAN) 1 MG tablet
1 | ORAL_TABLET | Freq: Every day | ORAL | Status: AC
Start: 2014-04-08 — End: 2014-04-09

## 2014-04-08 MED ORDER — GADAVIST (gadobutrol) Soln 10 mL
1 | Freq: Once | INTRAVENOUS | Status: AC | PRN
Start: 2014-04-08 — End: 2014-04-08
  Administered 2014-04-09: 02:00:00 10 mL/kg via INTRAVENOUS

## 2014-04-08 NOTE — Unmapped (Signed)
Rx called to pharmacy-complete

## 2014-04-08 NOTE — Unmapped (Signed)
Patient calling in for sedation med to take before MRI scheduled today at 730pm. [facility] [phone]. Plz call patient back on cell, thank you

## 2014-04-09 ENCOUNTER — Ambulatory Visit: Admit: 2014-04-09 | Payer: PRIVATE HEALTH INSURANCE | Attending: Neurology

## 2014-04-09 DIAGNOSIS — N318 Other neuromuscular dysfunction of bladder: Secondary | ICD-10-CM

## 2014-04-09 MED ORDER — oxybutynin (DITROPAN-XL) 10 MG 24 hr tablet
10 | ORAL_TABLET | ORAL | Status: AC
Start: 2014-04-09 — End: 2015-04-15

## 2014-04-09 MED ORDER — cholecalciferol, vitamin D3, 50,000 unit capsule
1250 | ORAL_CAPSULE | ORAL | Status: AC
Start: 2014-04-09 — End: 2014-10-01

## 2014-04-09 NOTE — Unmapped (Signed)
If the patient started improving infusion can be stopped on Thursday otherwise should be extended to Friday.

## 2014-04-09 NOTE — Unmapped (Signed)
Date:  04/09/2014  Name: Melanie [name]  Age:  57 y.o.      Date Symptom   Onset/Outcome Brain MRI Spine MRI Tests DMT period A/E Meds   2000 Fatigue, cognitive G/P        2004 Blurry vision B/L G/I  Dx MS    Rebif IVMP   2013  Stable 04-now  PV -ve      03/2014 3/15 R leg W after cold                           Onset: Acute/Gradual , Outcome: Resolved/Improved/Progressed, MRI: Peri Ventricular/Juxta Cortical,      HPI:      04/09/14  On 4/2, she woke up and couldn't walk, felt her R leg was weak. Also lost sensation with tingling. She waited to see if this would get better. She called on 4/6 and Dr. Genelle Gather thought she is probably having exacerbation. Labs were done with CBC, BMP, UA which did not show evidence of infection. Nurse visited her at home to get IV access. IV solumedrol was started on 4/12. She got the second dose today. Since about Friday last week, sensation was coming back. Weakness is improved but not back to baseline. She is able to walk. Dancing is still hard. She is still on Rebif, doing ok with the injection. This is a first real exacerbation since 2004.   Recent exacerbation happened after recent URI.    02/27/13  She was working as OR Haematologist at [employer]. She was feeling very tired with some cognitive and memory problem around 2000. Thne she had an episode of visual change in both eye in 2004 with partial recovery. She was Dx with MS based on MRI. She was started on Rebif . She thinks it works and tolerating it well however does not like injection. Her last MRI was from April 2013.   Last year she lost 25 Lbs by diet and exercise after she was diagnosed with Diabetes but she regain that weight. She is planning to go back in diet this week.     I reviewed Dr. 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 Pt is currently on Rebif and is tolerating it well. She has not had an MRI since  January 2012. Since pts last visit she feels her condition is getting worse. She is experiencing cognitive issues including concentration and memory issues. She has also started to urinate uncontrollably and has missed work as a result. Pt is having trouble walking due to pain in both knees and L hip pain. She also complains of stress at work which causes her to be depressed.   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 Pt is currently on Rebif and is tolerating it well. She has not had an MRI since January 2012. Since pts last visit she feels her condition is getting worse. She is experiencing cognitive issues including concentration and memory issues. She has also started to urinate uncontrollably and has missed work as a result. Pt is having trouble walking due to pain in both knees and L hip pain. She also complains of stress at work which causes her to be depressed.   Due to pts complaints of cognitive issues, we will refer her for a neuropsych eval.  We will also give her the contact number to the social worker for the [facility]. I would like to obtain and MRI of the brain and c spine.      Review of System:      STM   Main symptom she get disability   Urinary Incontinece Oxybutinin helps, she has done pelvic exercise, this was one the reason she stopped working   Walking Difficulty Tired and back pain, can do more after exercise   Imbalance  Trip over but no fall   Fatigue                        Exhausted    Medications:      Current Outpatient Prescriptions   Medication Sig   ??? albuterol Inhale 2 puffs into the lungs. Every 4 to 6 hours as needed.    ??? baclofen Take 1 tablet (10 mg total) by mouth 3 times a day.   ??? blood sugar diagnostic Test blood sugars up to twice a day   ??? cholecalciferol (vitamin D3) Take 1 tablet (1,000 Units total) by mouth 2 times a day as needed.   ??? gabapentin Take 1 capsule (300 mg total) by mouth 3 times a day.   ??? ibuprofen Take 800 mg by mouth every 8  hours as needed.   ??? inhalational spacing device by Misc.(Non-Drug; Combo Route) route. AEROCHAMBER PLUS.  Use with MDI    ??? interferon beta-1a (albumin) Inject 0.5 mLs (44 mcg total) subcutaneously every Monday, Wednesday, and Friday.   ??? lisinopril-hydrochlorothiazide Take 1 tablet by mouth daily.   ??? metFORMIN Take 1 tablet (500 mg total) by mouth 2 times a day with meals.   ???     ??? oxybutynin Take two tablets by mouth daily     No current facility-administered medications for this visit.      Allergies: Compazine and Prochlorperazine    History:    PMHx:  has a past medical history of Asthma; Multiple sclerosis; Hypertension; and Diabetes mellitus.     PSHx:  has past surgical history that includes Knee surgery; Appendectomy; and Hysterectomy.    Social Hx: reports that she has never smoked. She has never used smokeless tobacco. She reports that she drinks alcohol. She reports that she does not use illicit drugs. She lives by herself. She feels she is safe but has to take extra time. Feels depressed being lonely. Grandkids lives cross the street and come and visit her. She goes to [employer] [facility] [employer] [facility] [employer], do dance club and try to stay active.     ZOX:WRUEAV history includes Alcohol abuse in her father and other; Brain cancer in her other; COPD in her father; Cancer in her father, maternal grandmother, mother, and sister; Diabetes in her daughter, paternal aunt, paternal grandfather, and paternal uncle; Skin cancer in her other; Stomach cancer in her other; Throat cancer in her other.    Physical Exam:    BP 142/76   Pulse 94   Ht 5' 4 (1.626 m)   Wt 234 lb (106.142 kg)   BMI 40.15 kg/m2  Here is my exam from previous visits. I repeated some of these exams and updated the changes.   Mental Status:Patient registered 3 objects without difficulty and recalled 3/3 after five minutes.  The patient was fully oriented and had a normal fund of knowledge.  Cranial Nerves: Color Vision12/12 OD and 10/12 OS, best corrected visual acuity was  20/20 OD and 20/20 OS.  There was no afferent pupillary deficit.  Funduscopic exam revealed normal optic nerve heads.   Horizontal and vertical eye movements were full.  There was no nystagmus.  Facial sensation was intact to pinprick.  There was no facial weakness.  Hearing was intact to finger rubbing bilaterally. Palate moved fully and symmetrically.  Tongue protruded in the midline.  There was no dysarthria.  Motor:   Strength:R-L (MRC Scale)   Deltoid  5 - 5   Hip Fx   4+ - 5  Biceps   5 - 5   Knee Ex  4+ - 5   Triceps  5 - 5   Knee Fx  4+ - 5   Wrist Ex  5 - 5   Plantar Fx 4+ - 5   Grip         5 - 5   Ankle dorsiflex 4+ - 5  Tone was normal  Reflexes: R / L (MRC Scale)   Biceps  2 / 2   Quadriceps 2 / 2   Triceps  2 / 2   Achilles  2 / 2   Brachoradialis 2 / 2   Toes   Up / down   Coordination: There was no ataxia on finger to nose or heel to shin testing.  Sensation: decreased vibration B/L.  position and pinprick sensation were intact in all four extremities.  Gait and station:  Unable to do tandem walk and hopping on either foot.   T25-FW date Time assistance   02/26/13  6 No   04/09/14 7.5 No     Tests:      04/08/2014 COMPARISON: MRI dated 03/27/2012.  MRI head:  1. No significant interval change in the moderate burden of T2 signal prolongation involving the periventricular, deep, and central pontine white matter consistent with the clinical diagnosis of multiple sclerosis. No new lesions, restricted diffusion,   or enhancement is identified.  2. Small amount of layering fluid signal in the right maxillary sinus.  MRI cervical spine:  1. No evidence of cord signal abnormalities or enhancement.  2. Very mild multilevel degenerative discogenic changes without evidence of central canal or neuroforaminal stenosis, unchanged.  MRI thoracic spine:  1. No evidence of cord signal abnormalities or enhancement.  2. Very mild degenerative discogenic changes without evidence of central canal or neuroforaminal stenosis,  involving the T3-T4 through T7-T8 levels.            Lab Results   Component Value Date    WBC 10.0 04/02/2014    WBC 10-20* 04/02/2014    HGB 13.9 04/02/2014    HCT 43.5 04/02/2014    MCV 94.1 04/02/2014    PLT 229 04/02/2014     Lab Results   Component Value Date    CREATININE 0.69 04/02/2014    BUN 13 04/02/2014    NA 136 04/02/2014    K 3.6 04/02/2014    CL 96* 04/02/2014    CO2 28 04/02/2014     Lab Results   Component Value Date    ALT 72* 04/02/2014    AST 60* 04/02/2014    ALKPHOS 80 04/02/2014    BILITOT 0.5 04/02/2014       Lab Results   Component Value Date    VITD25H 25.9* 02/27/2013       Assessment/Plan:       Melanie [name] is a 58 yo AF with h/o Multiple Sclerosis with cognitive and urinary symptoms as main complain. She has been  stable on Rebif in the past. However, about 2 weeks ago, she develped R leg weakness which gradually improved. Currently going through IV Solumedrol which she will finish on 4/18. MRI head, C/T spine was done on 4/13 which did not show new or enhancing lesion.      1. Multiple sclerosis  - Finish 5 day course of IV solumedrol  - continue Aquatic therapy and physical therapy  - Symptomatic management with baclofen and oxybutinin  - Consider changing the treatment to Tecfidera    2. Cognitive impairment    3. Fatigue    4. Vitamin D deficiency  - Need Vit D supplement    5. Obesity   - Counseled about importance of low fat diet in MS. She would like to take medication for weight loss, I encouraged her to stay more active in and out side the home rather than taking diet pill.    Medical Decision Making:   * review/order clinical lab tests   * review/order radiology tests   * review/order other diagnostic or tx interventions   * obtain old records or history from another person   * review and summarization of old records   * independent review of data- e.g. image- specimen   * Permanent chart problem/surgery list reviewed   * Permanent chart chronic med/ allergy list reviewed   * Permanent chart social/family  history reviewed   * reviewed films     Risks & Benefits:   * Risks, benefits and treatment options discussed with patient.   Risk of complications: moderate   Time spent with patient: 30 min   More than 50% of this time was spent discussing:   * Diagnosis   * Management   * Treatment Plan   * Diagnostic Results as indicated above    Raeanne Gathers, MD  Assistant Professor of Neurology   Director of The [facility]   [address]   [place], [place] [address]????  [phone]

## 2014-04-09 NOTE — Unmapped (Addendum)
Continue Rebif   One more steroid infusion then you can stop  Start Vitamin D [address] IU weekly, recheck the level in 3 months  Blood work for Rebif yearly   Return in 1 year

## 2014-04-09 NOTE — Unmapped (Signed)
Per [facility] nurse, pt's IV infiltrated last night, had MRI with contrast yesterday and Rad used existing IV.  Started Solu Medrol infusion Sunday night, did not receive yesterday's dose.  Nurse will try to reinsert IV today, pt is difficult stick, therapy was to end after Thursday dose.  Does MD wish to extend infusion to Friday to get to a 5th dose?  Please advise RN.

## 2014-04-09 NOTE — Unmapped (Signed)
Left message with  Faith regarding Dr. Genelle Gather message.

## 2014-04-10 ENCOUNTER — Ambulatory Visit: Admit: 2014-04-10

## 2014-04-10 NOTE — Unmapped (Signed)
Faith advised on previous message-complete

## 2014-04-10 NOTE — Unmapped (Signed)
Patient calling for clarification on solu medrol infusion

## 2014-04-10 NOTE — Unmapped (Signed)
Patient advised can stop today this will make her 3 rd infusion.-complete

## 2014-04-10 NOTE — Unmapped (Signed)
Patient had contacted them advising her she wanted to stop infusions because of her blood sugar. She is calling to confirm this from the doctor.

## 2014-04-17 NOTE — Unmapped (Signed)
Erskine Squibb is calling regarding information required to find out if Pt is legally blind? Pt was just on phone with call center and was reported she is legally blind. She would like to know when this is diagnosed. I was unable to provide this info. I could not find this in her history. Please call Erskine Squibb back at [phone] ext [phone].

## 2014-04-17 NOTE — Unmapped (Signed)
Spoke with Marchelle Folks about Rx.

## 2014-04-17 NOTE — Unmapped (Signed)
Marchelle Folks from [facility] calling about Interferon script (Rebif).  According to script patient is take 3X weekly.  Script is only written for 1 syringe with 11 refills.  Please call to clarify.

## 2014-04-17 NOTE — Unmapped (Signed)
Jan will contact patient wanting to know if the blindness occurred while on Rebif or before Rebif-She will reach out to patient-complete

## 2014-04-19 NOTE — Unmapped (Signed)
In regards to tele encounter on 04/17/14 (unable to OPEN), Pt stating that her med insurance, [employer],  is requesting clinical notes/information in order to approve her Rebif.  Fax [phone].  Pt is OUT of med at this time.     Pt could not provide a # for [employer]   Rebif Support Program number [phone] if needed     Pt is requesting a call back once this has been completed.

## 2014-04-19 NOTE — Unmapped (Signed)
Spoke with Maryellen Pile from [employer] and office notes needed.  Office notes faxed to [phone].

## 2014-04-22 ENCOUNTER — Encounter: Admit: 2014-04-22 | Discharge: 2014-04-22 | Payer: PRIVATE HEALTH INSURANCE

## 2014-04-22 DIAGNOSIS — G35 Multiple sclerosis: Secondary | ICD-10-CM

## 2014-04-22 NOTE — Unmapped (Signed)
Physical Therapy Initial Evaluation and Plan of Care  Name: Melanie [name]     Date of Birth: [birth year]      MRN: [id number]    Date of evaluation: 04/22/2014  Referring Physician: Raeanne Gathers, MD  [address]  [place]  [place], [place] [address] Phone: [phone] Fax: [phone]  Specific Order: aquatic therapy   Date of Onset: 2009 MS- exacerbation  April 4 th 2015  Primary Medical Diagnosis:   1. Gait abnormality      Insurance: Payor: EXCHANGE / Plan: EXCHANGE [employer] JUST 4 ME / Product Type: *No Product type* /     Subjective/History:   History of Current Problem/Reason for Referral : Melanie [name] is a 58 y.o. female who presents today with chief complaintof decreased strength  decreased ambulation/ability to do stairs  decreased balance/ coordination  Due to exacerbation last month.  Pt feels strength is improving but  Not back to baseline.   Symptoms caused by:MS exacerbation  Previous Treatment:PT    Precautions/Contraindications: MS      PMH/PSH:   Past Medical History   Diagnosis Date   ??? Asthma    ??? Multiple sclerosis      Dx 2000   ??? Hypertension    ??? Diabetes mellitus    ,   Past Surgical History   Procedure Laterality Date   ??? Knee surgery     ??? Appendectomy     ??? Hysterectomy       not sure what second type surgery she had. Had 2 surgeries, maybe partial hys then went back and toook the cervix.     Medications: She  has a past medical history of Asthma; Multiple sclerosis; Hypertension; and Diabetes mellitus.  Her family history includes Alcohol abuse in her father and other; Brain cancer in her other; COPD in her father; Cancer in her father, maternal grandmother, mother, and sister; Diabetes in her daughter, paternal aunt, paternal grandfather, and paternal uncle; Skin cancer in her other; Stomach cancer in her other; Throat cancer in her other.  She  reports that she has never smoked. She has never used smokeless tobacco. She reports that she drinks alcohol. She reports that she does  not use illicit drugs.    Living Environment/ Current functional level: lives by self- grandson available a few nights a week   Level of assistance Available:      none   Household Barriers: stairs to enter 0 and stairs within home 1 floor   Equipment at home: rolling walker, single-point cane and shower seat   Prior Level of Function:  Able to line dance ( for exercise and leisure).  Worked in operating room and UC as dispatcher   Currently: disability- 1.5 years.      Trial return to line dancing a week or 2 ago.       Uses scooter if avail in grocery and at hospital.     Back pain  After about 500 feet.    Uses cane for community  Pain: some pain in low back with walking.  Occasional shoot  Pain in right leg ( MS related)    Level (0-10): 4/10 at present;     Location: low back    Quality:       Exacerbated By: walking    Relieved By: rest         Additional Comments: minimal complaints        Objective:       Observation:  Posture: flat spine with mild upper trunk shifted right   Mental Status: alert, appears stated age and cooperative   Edema:    Atrophy:     Gait: Abnormal- wider BOS with increased arm swing ( slight high guard position).  Slightly ataxic?     Vision: no problems- wears glasses     UE   Strength: Normal or tested as follows:                                   Right   Left  Shoulder flex 5 4+ pain   abd 5 4+ pain   IR 5 5   ER 5 4+ pain   Elbow 5 5   wrist         UE Range of Motion:  WFL                                LE   Strength: Normal or tested as follows:    Right   Left  Hip flex 4- 4+   abd     ext     Knee ext 4+ 5   Knee flex 4+ 4+   Ankle DF 4 4              LE Range of Motion: Normal  But slightly limited due to  Weight     Right    Left  Hip flex WFL    abd     ext     Knee ext     Knee flex     Ankle DF           Balance:   - Sitting WFL  - Standing impaired SLS unable right, CGA left.  rhomberg EC with CGA    Coordination:  not tested due to time    Sensation:  impaired shoot  ing pain and tighling right LE- every few days ( recent from exacerbation)    Neurological Status:  NT     Functional Tasks:   Task Level of Assistance/Comments   Transfers indep   Bed mobility indep   Gait Even Surfaces indep for approx 500 ft (pt report)   Gait Uneven Surfaces  appears  CGA   Stairs                Flexibility:  Palpation: not tested  Specialized Tests: Rhomberg test with eyes closed ( CGA),   Additional Assessment Information:        Assessment:   Assessment/Problem List: Patient presents with  Decreased strength, decreased balance contributing to functional limitations including difficulty with community distance walking,higher level balance, and therefore at risk for falls. . Patient would benefit from skilled PT to address the aforementioned deficits.   Rehabilitation Potential: Based on this therapist's assessment, Melanie [name] has the following rehabilitation potential for the PT goals stated below: : excellent    Plan Of Care:   Patient/Family Goals: By discharge, pt would like to improve mobility     Short Term Goals: time frame 4 wks Status at Eval 4.27.15    patient will ambulate community distances on uneven surfaces using no assistive device independently . Using st cane when leaving the house, uses scooter for longer community distances at times   patient will perform home exercise program based on therapy recommendations independently to maintain  benefits gained in physical therapy. Needs structured HEP for LE strength and ROM as well as core stability.       Plan:    Treatment to Include: Therapeutic Exercise  Balance/Coordination  Therapeutic Activities  Patient/Family Education  Neuromuscular Re-education     Patient/Family Agree to Treatment: Yes          Therapy Frequency/Duration: Patient to receive skilled PT services up to 2 times per week for up to 4 weeks or up to 8 visits starting from the first scheduled follow up visit within this plan of care.           Therapist  Signature: Lise Auer Jevon Littlepage  Date: 04/22/2014    Physician Certification   I certify that the above patient is under my care an requires the above services. These professional services are to be provided from an established plan, related to the diagnosis and reviewed by me every 90 days.     Additional comments/revisions:     Physician Name: Raeanne Gathers, MD    Signature_______________________________________ Date: _______

## 2014-04-22 NOTE — Unmapped (Signed)
Melanie Gathers, MD  Assistant Professor of Neurology   Director of The [facility]   [address]   [place], [place] [address]????  [phone]

## 2014-04-22 NOTE — Unmapped (Signed)
Diagnosis:   1. Gait abnormality            Referring Provider:Zabeti, Laqueta Due, MD    Insurance plan:   Payor/Plan Subscr DOB Sex Relation Sub. Ins. ID Effective Group Num   1. EXCHANGE - EXSIRENITY, SHEW June 28, 1956 Female  [id number] 04/19/14 HIXOH                                   [address]                         # of visits per insurance authorization:  PT OT SP`- PT HAS [employer] JUST-4-ME INS PLAN,   20 V PER CAL YR FOR EACH SERVICE-USED 0, CAN GET   MORE APPR CALL [phone], FX # [phone]    # of visits per POC: 2x 4 weeks     Date of Initial Eval: 4.27.15    Date of last HQI:ONGE    Patient/Family Goals: By discharge, pt would like to improve mobility     Short Term Goals: time frame 4 wks Status at Eval 4.27.15    patient will ambulate community distances on uneven surfaces using no assistive device independently . Using st cane when leaving the house, uses scooter for longer community distances at times   patient will perform home exercise program based on therapy recommendations independently to maintain benefits gained in physical therapy. Needs structured HEP for LE strength and ROM as well as core stability.     _____________________________________________________________________  Pain:NT/10 Location:    Precautions: MS    Subjective:     Objective: PT treatment per log:    Activity Date:  4.27.15 Date:  Date: Date:    Visit # eval                                                                            Total Treatment Time 21 min  ( pt arrived late)      Timed Code Treatment Minutes 21 min eval        Education Addressed During This Session:    Assessment/Progress toward Goals:    Assessment:   Assessment/Problem List: Patient presents with  Decreased strength, decreased balance contributing to functional limitations including difficulty with community distance walking,higher level balance, and therefore at risk for falls. . Patient would benefit from skilled PT to address the aforementioned  deficits.   Rehabilitation Potential: Based on this therapist's assessment, MASHAL SLAVICK has the following rehabilitation potential for the PT goals stated below: : excellent        Plan:  Aquatics for strength and balance and return to previous functional level- AQUATICS

## 2014-04-26 ENCOUNTER — Encounter: Admit: 2014-04-26 | Discharge: 2014-04-26 | Payer: PRIVATE HEALTH INSURANCE

## 2014-04-26 DIAGNOSIS — G35 Multiple sclerosis: Secondary | ICD-10-CM

## 2014-04-26 NOTE — Unmapped (Addendum)
Diagnosis:   1. Gait abnormality            Referring Provider:Zabeti, Laqueta Due, MD    Insurance plan:   Payor/Plan Subscr DOB Sex Relation Sub. Ins. ID Effective Group Num   1. EXCHANGE - EXKAIESHA, TONNER [birth year] Female  [id number] 04/19/14 HIXOH                                   [address]                         # of visits per insurance authorization:  PT OT SP`- PT HAS [employer] JUST-4-ME INS PLAN,   20 V PER CAL YR FOR EACH SERVICE-USED 0, CAN GET   MORE APPR CALL [phone], FX # [phone]    # of visits per POC: 2x 4 weeks     Date of Initial Eval: 4.27.15    Date of last HQI:ONGE    Patient/Family Goals: By discharge, pt would like to improve mobility     Short Term Goals: time frame 4 wks Status at Eval 4.27.15    patient will ambulate community distances on uneven surfaces using no assistive device independently . Using st cane when leaving the house, uses scooter for longer community distances at times   patient will perform home exercise program based on therapy recommendations independently to maintain benefits gained in physical therapy. Needs structured HEP for LE strength and ROM as well as core stability.     _____________________________________________________________________  Pain:6/10 Location:  LBP  Precautions: MS    Subjective: patient reports pain in lower back     Objective: PT treatment per log:    Activity Date: 4.27.15 Date: 04/26/14 Date: Date:    Visit # eval 2/8            d       Warm up laps   F/B/SS   x 3 laps each      SKTC/HS   20H x 3      Runners gastroc   20H x 3      3 way hip   x 10     Open gate   x 10     Heel raises  2 x 10      Squats    x 10            Deep water bike   x 3 min      Deep water ski   x 2 min                    Total Treatment Time 21 min  ( pt arrived late) Late arrival   33 min      Timed Code Treatment Minutes 21 min eval 33 min Aq Ex       Education Addressed During This Session:initiated aquatic exercises/pool policy checking in at the front  OP PT desk before travel to pool    Assessment/Progress toward Goals:completed warm ups with wide base of support and LE circumduction. Tolerated session fair reported feeling drained after upon exiting pool.  Verbal cues for posture.    Assessment:   Assessment/Problem List: Patient presents with  Decreased strength, decreased balance contributing to functional limitations including difficulty with community distance walking,higher level balance, and therefore at risk for falls. . Patient would benefit from skilled PT to  address the aforementioned deficits.   Rehabilitation Potential: Based on this therapist's assessment, LILINOE ACKLIN has the following rehabilitation potential for the PT goals stated below: : excellent        Plan:  Aquatics for strength and balance and return to previous functional level- AQUATICS

## 2014-04-29 ENCOUNTER — Encounter: Admit: 2014-04-29 | Discharge: 2014-04-29 | Payer: PRIVATE HEALTH INSURANCE

## 2014-04-29 DIAGNOSIS — R269 Unspecified abnormalities of gait and mobility: Secondary | ICD-10-CM

## 2014-04-29 NOTE — Unmapped (Signed)
Diagnosis:   1. Gait abnormality            Referring Provider:Zabeti, Laqueta Due, MD    Insurance plan:   Payor/Plan Subscr DOB Sex Relation Sub. Ins. ID Effective Group Num   1. EXCHANGE - EXMARGUERETTE, SHELLER 1956-09-21 Female  [id number] 04/19/14 HIXOH                                   [address]                         # of visits per insurance authorization:  PT OT SP`- PT HAS [employer] JUST-4-ME INS PLAN,   20 V PER CAL YR FOR EACH SERVICE-USED 0, CAN GET   MORE APPR CALL [phone], FX # [phone]    # of visits per POC: 2x 4 weeks     Date of Initial Eval: 4.27.15    Date of last HQI:ONGE    Patient/Family Goals: By discharge, pt would like to improve mobility     Short Term Goals: time frame 4 wks Status at Eval 4.27.15    patient will ambulate community distances on uneven surfaces using no assistive device independently . Using st cane when leaving the house, uses scooter for longer community distances at times   patient will perform home exercise program based on therapy recommendations independently to maintain benefits gained in physical therapy. Needs structured HEP for LE strength and ROM as well as core stability.     _____________________________________________________________________  Pain:4/10 Location:  LBP  Precautions: MS    Subjective: patient reports pain in lower back (previous complaint)  Otherwise nothing new to report.    Objective: PT treatment per log:    Activity Date: 4.27.15 Date: 04/26/14 5.4.15 Date:    Visit # eval 2/8 3/8           Warm up laps   F/B/SS   x 3 laps each  3x at railing    SKTC/HS   20H x 3  h20 3x ea  3x ea    Runners gastroc   20H x 3  h20 3x    3 way hip   x 10 15x  Cues for core/abs    Open gate   x 10 15    Heel raises  2 x 10  20    Squats    x 10 15    Step ups   20x fwd    Deep water bike   x 3 min      Deep water ski   x 2 min      March high knee   5 min    Tire run   3 min    Alt HS kicks    2 min    Total Treatment Time 21 min  ( pt arrived late) Late  arrival   33 min  38 min    Timed Code Treatment Minutes 21 min eval 33 min Aq Ex 38 aquex      Education Addressed During This Session:   Drink plenty of cold water    Assessment/Progress toward Goals:completed  Exercises without complaints.  Felt  Slightly dizzy during treatment but could be attributed to increased turbulence from water exercise class.  Otherwise able to progress exs and increase reps today.      Plan:  Cont POC

## 2014-05-10 ENCOUNTER — Encounter: Admit: 2014-05-10 | Discharge: 2014-05-10 | Payer: PRIVATE HEALTH INSURANCE

## 2014-05-10 DIAGNOSIS — R269 Unspecified abnormalities of gait and mobility: Secondary | ICD-10-CM

## 2014-05-10 NOTE — Unmapped (Signed)
Diagnosis:   1. Gait abnormality            Referring Provider:Zabeti, Laqueta Due, MD    Insurance plan:   Payor/Plan Subscr DOB Sex Relation Sub. Ins. ID Effective Group Num   1. EXCHANGE - EXSAAYA, PROCELL 21-Sep-1956 Female  [id number] 04/19/14 HIXOH                                   [address]                         # of visits per insurance authorization:  PT OT SP`- PT HAS [employer] JUST-4-ME INS PLAN,   20 V PER CAL YR FOR EACH SERVICE-USED 0, CAN GET   MORE APPR CALL [phone], FX # [phone]    # of visits per POC: 2x 4 weeks     Date of Initial Eval: 4.27.15    Date of last BMW:UXLK    Patient/Family Goals: By discharge, pt would like to improve mobility     Short Term Goals: time frame 4 wks Status at Eval 4.27.15    patient will ambulate community distances on uneven surfaces using no assistive device independently . Using st cane when leaving the house, uses scooter for longer community distances at times   patient will perform home exercise program based on therapy recommendations independently to maintain benefits gained in physical therapy. Needs structured HEP for LE strength and ROM as well as core stability.     _____________________________________________________________________  Pain:3/10 Location:  LBP  Precautions: MS    Subjective: nothing new since here last    Objective: PT treatment per log:    Activity Date: 4.27.15 Date: 04/26/14 5.4.15 Date:  05/10/14       Visit # eval 2/8 3/8 4/8                Warm up laps   F/B/SS   x 3 laps each  3x at railing X 3 each at railing cues for active core      High step march laps     NEW x 3 cues for stable erect core      High step march laps with hold          SKTC/HS   20H x 3  h20 3x ea  3x ea Hamstring cues for erect trunk,  X 3      Runners gastroc   20H x 3  h20 3x X 3 cues to step closer to rail to avoid extended locked elbows      3 way hip   x 10 15x  Cues for core/abs X 20      Open gate   x 10 15 X 20 cues for stable core      Heel raises   2 x 10  20       Squats    x 10 15 On bottom step, open palms, x 20      Step ups   20x fwd Feet moving bottom to 1st, open palms on railings, x 20      Deep water bike   x 3 min         Deep water ski   x 2 min         March high knee   5 min       Tire  run   3 min       Alt HS kicks    2 min       Total Treatment Time 21 min    ( pt arrived late) Late arrival   33 min  38 min Wrong time but able to be seen  40 min      Timed Code Treatment Minutes 21 min eval 33 min Aq Ex 38 aquex 40 min aq ex 1:1 3        Education Addressed During This Session:  05/10/14  Frequent cues for active core.  Discussed d/c poc.  Thinks insurance covers silver sneakers.  Discussed that those who have a plan in place before d/c do better with follow through.   Drink plenty of cold water    Assessment/Progress toward Goals:  05/10/14  Made increases in program.    Plan:  Confirmed next appt time as Monday 8:30 am, so patient comes at correct time.  **  PATIENT REQUESTING PHONE # TO [facility]>    Cont POC

## 2014-05-13 ENCOUNTER — Encounter: Admit: 2014-05-13 | Discharge: 2014-05-13 | Payer: PRIVATE HEALTH INSURANCE

## 2014-05-13 DIAGNOSIS — R269 Unspecified abnormalities of gait and mobility: Secondary | ICD-10-CM

## 2014-05-13 NOTE — Unmapped (Signed)
Diagnosis:   1. Gait abnormality            Referring Provider:Zabeti, Laqueta Due, MD    Insurance plan:   Payor/Plan Subscr DOB Sex Relation Sub. Ins. ID Effective Group Num   1. EXCHANGE - EXSTANISLAWA, GAFFIN Jan 13, 1956 Female  [id number] 04/19/14 HIXOH                                   [address]                         # of visits per insurance authorization:  PT OT SP`- PT HAS [employer] JUST-4-ME INS PLAN,   20 V PER CAL YR FOR EACH SERVICE-USED 0, CAN GET   MORE APPR CALL [phone], FX # [phone]    # of visits per POC: 2x 4 weeks     Date of Initial Eval: 4.27.15    Date of last HQI:ONGE    Patient/Family Goals: By discharge, pt would like to improve mobility     Short Term Goals: time frame 4 wks Status at Eval 4.27.15    patient will ambulate community distances on uneven surfaces using no assistive device independently . Using st cane when leaving the house, uses scooter for longer community distances at times   patient will perform home exercise program based on therapy recommendations independently to maintain benefits gained in physical therapy. Needs structured HEP for LE strength and ROM as well as core stability.     _____________________________________________________________________  Pain:3/10 Location:  LBP  Precautions: MS    Subjective:     Objective: PT treatment per log:    Activity Date: 4.27.15 Date: 04/26/14 5.4.15 Date:  05/10/14 5.18.15      Visit # eval 2/8 3/8 4/8 5/8               Warm up laps   F/B/SS   x 3 laps each  3x at railing X 3 each at railing cues for active core 3x ea     High step march laps     NEW x 3 cues for stable erect core      High step march laps with hold          SKTC/HS   20H x 3  h20 3x ea  3x ea Hamstring cues for erect trunk,  X 3 H20 3x ea      Runners gastroc   20H x 3  h20 3x X 3 cues to step closer to rail to avoid extended locked elbows      3 way hip   x 10 15x  Cues for core/abs X 20 20x      Open gate   x 10 15 X 20 20     Heel raises  2 x 10  20  20x      Squats    x 10 15 On bottom step, open palms, x 20      Step ups   20x fwd Feet moving bottom to 1st, open palms on railings, x 20 20x ea     Deep water bike   x 3 min         Deep water ski   x 2 min         March high knee   5 min       Tire run  3 min       Alt HS kicks    2 min       Total Treatment Time 21 min    ( pt arrived late) Late arrival   33 min  38 min Wrong time but able to be seen  40 min 43 min     Timed Code Treatment Minutes 21 min eval 33 min Aq Ex 38 aquex 40 min aq ex 1:1 3 18  min aq ex  Rest supervised tx       Education Addressed During This Session:  05/10/14  Frequent cues for active core.  Discussed d/c poc.  Thinks insurance covers silver sneakers.  Discussed that those who have a plan in place before d/c do better with follow through.   Drink plenty of cold water   5.18.15:  Cont with mobility  exs in pool    Assessment/Progress toward Goals:   tol session well with minimal complaints    Plan:  Cont POC

## 2014-05-13 NOTE — Unmapped (Deleted)
Physical Therapy Initial Evaluation and Plan of Care  Name: Melanie [name]     Date of Birth: 1956/06/11      MRN: [id number]    Date of evaluation: 05/13/2014  Referring Physician: Raeanne Gathers, MD  [address]  [place]  [place], [place] [address] Phone: [phone] Fax: [phone]  Specific Order: ***   Date of Onset: ***  Primary Medical Diagnosis:   1. Gait abnormality      Insurance: Payor: EXCHANGE / Plan: EXCHANGE [employer] JUST 4 ME / Product Type: *No Product type* /     Subjective/History:   History of Current Problem/Reason for Referral : Melanie [name] is a 58 y.o. female who presents today with chief complaint of ***  PMH/PSH:   Past Medical History   Diagnosis Date   ??? Asthma    ??? Multiple sclerosis      Dx 2000   ??? Hypertension    ??? Diabetes mellitus    ,   Past Surgical History   Procedure Laterality Date   ??? Knee surgery     ??? Appendectomy     ??? Hysterectomy       not sure what second type surgery she had. Had 2 surgeries, maybe partial hys then went back and toook the cervix.     Medications: {MISC; PAST HISTORY LINKS:20407}    Prior Level of Function: ***  Current level of Function:  ***    Prior Treatment for this condition:{PT/OT prior tx efforts:21412}  Precautions/Contraindications: ***    Objective:     Mental Status: {Exam; general:16600::alert,appears stated age,cooperative}    Posture:  Edema:   Pain: Level (0-10): ***/10 at present;  ***/10 at worst;  ***/10 at best  Location: ***   Decreased/Relieved By: {ot list relieved by:21281}   Exacerbated By: {ot list exacerbated by:21282}  Additional Comments:      UE   Strength: {Normal/decreased:140016::Appears normal} or as tested     UE Range of Motion: {Normal/decreased:140016::Appears normal} or as tested    Cervical Screen:  Sensation:  {ot list coordination:21284}  Flexibility:    Palpation:   Specialized Tests:   Additional Assessment Information:   Assessment:   Assessment/Problem List: Patient presents with  ***  contributing to functional limitations including impaired functional abilities, pain, decreased flexibility and lack of knowledge regarding the nature of the condition and self management/exercise . Patient would benefit from skilled PT to address the aforementioned deficits.     Rehabilitation Potential: Based on this therapist's assessment, Melanie [name] has the following rehabilitation potential for the PT goals stated below: : {ot list rehab potential:21286}    PHYSICAL THERAPY PLAN OF CARE     Patient/Family Goals: by discharge patient wants to ***.    Short Term Goals:    Goal  Initial status    1.  Within *** visits  patient will be independent with home exercise program for the conditions being addressed to continue with gains made in therapy after discharge Does not have a structured home exercise program to perform addressing noted deficits    2.  Within next visit complete addl eval components if needed and establish addl goals as indicated based on findings Unable to complete all eval components      Long Term Goals:      Goal by discharge Initial status    1.  Patient will ***     2.  Patient will ***       Plan:  Treatment to Include: {PT/OT needs:21305}    Patient/Family Agree to Treatment: {YES NO REASON:24055}         Therapy Frequency/Duration: Patient to receive skilled PT services up to *** times per week for up to *** weeks or up to *** visits starting from the first scheduled follow up visit,  within this plan of care.      Certification Period: 05/13/2014 - 90 days     Therapist Signature: Pricilla Riffle PT  Date: 05/13/2014    Physician Certification   I certify that the above patient is under my care an requires the above services. These professional services are to be provided from an established plan, related to the diagnosis and reviewed by me every 90 days.     Additional comments/revisions:     Physician Name: Raeanne Gathers, MD    Signature_______________________________________ Date:  _______

## 2014-05-16 NOTE — Unmapped (Signed)
Patient call less than 24 hours to cancel appointment  05/16/2014 due to [facility] PTA

## 2014-05-22 ENCOUNTER — Encounter: Admit: 2014-05-22 | Discharge: 2014-05-22 | Payer: PRIVATE HEALTH INSURANCE

## 2014-05-22 DIAGNOSIS — R269 Unspecified abnormalities of gait and mobility: Secondary | ICD-10-CM

## 2014-05-22 NOTE — Unmapped (Signed)
Physical Therapy Discharge Summary    Patient Name: Melanie [name]    NW#:[id number]   DOB: Feb 16, 1956  Date: 05/22/2014    Diagnosis: No diagnosis found.    # of visits completed: 5      # cancels/ no shows: 2   Short Term Goals: time frame 4 wks Status at Eval 4.27.15 5.27.15      patient will ambulate community distances on uneven surfaces using no assistive device independently . Using st cane when leaving the house, uses scooter for longer community distances at times No AD in the house  Using scooter for grocery and longer appointments mostly due to back pain   patient will perform home exercise program based on therapy recommendations independently to maintain benefits gained in physical therapy. Needs structured HEP for LE strength and ROM as well as core stability. Reports indep.    Given NUMBER to [facility]         Assessment:  Melanie [name] has been seen for Physical Therapy with treatment including instruction in aquatic exercises.  Melanie [name] has made good  progress towards goals demonstrating improvements in the following areas: improved amb, indep with HEP for aquatics.  She continues to have the following impairments: mild weakness, LBP, .  Recommend DC to aquatics exercise for long term  Benefit- pt given number to [place] aquatics program thru [place] [facility]..          Recommendations and Treatment Plan:   Pt has been discharge from PT care at this time due to: []  goals met []  lack of progress since prior assessment []  insufficient insurance [x]  no further need of PT skill level []  patient request []  medical reason    Physical Therapist Signature:  Marbeth Smedley L Kalen Ratajczak       Date:  05/22/2014

## 2014-05-22 NOTE — Unmapped (Signed)
Melanie Gathers, MD  Assistant Professor of Neurology   Director of The [facility]   [address]   [place], [place] [address]????  [phone]

## 2014-05-22 NOTE — Unmapped (Signed)
Diagnosis:   1. Gait abnormality            Referring Provider:Zabeti, Laqueta Due, MD    Insurance plan:   Payor/Plan Subscr DOB Sex Relation Sub. Ins. ID Effective Group Num   1. EXCHANGE - EXCARSYN, BOSTER [birth year] Female  [id number] 04/19/14 HIXOH                                   [address]                         # of visits per insurance authorization:  PT OT SP`- PT HAS [employer] JUST-4-ME INS PLAN,   20 V PER CAL YR FOR EACH SERVICE-USED 0, CAN GET   MORE APPR CALL [phone], FX # [phone]    # of visits per POC: 2x 4 weeks     Date of Initial Eval: 4.27.15    Date of last HQI:ONGE    Patient/Family Goals: By discharge, pt would like to improve mobility     Short Term Goals: time frame 4 wks Status at Eval 4.27.15 5.27.15      patient will ambulate community distances on uneven surfaces using no assistive device independently . Using st cane when leaving the house, uses scooter for longer community distances at times No AD in the house  Using scooter for grocery and longer appointments mostly due to back pain   patient will perform home exercise program based on therapy recommendations independently to maintain benefits gained in physical therapy. Needs structured HEP for LE strength and ROM as well as core stability. Reports indep.    Given NUMBER to [facility] program [facility] the [facility]     _____________________________________________________________________  Pain:0/10 Location:  LBP  Precautions: MS    Subjective:     Objective: PT treatment per log:    Activity Date: 4.27.15 Date: 04/26/14 5.4.15 Date:  05/10/14 5.18.15 5.27.15    Visit # eval 2/8 3/8 4/8 5/8 6/8         LAND TREAT   HEP for low back  SKC, HS st, bridges, SLR with core      15 min instruction   HEP for UE and core:  diag t band  Rows t band15      15 min instruction   There activity/self care      -discussed [facility]  -reviewed pool exs  -knee to chest positional relief rocking                              Warm up laps      F/B/SS   x 3 laps each  3x at railing X 3 each at railing cues for active core 3x ea    High step march laps     NEW x 3 cues for stable erect core     High step march laps with hold         SKTC/HS   20H x 3  h20 3x ea  3x ea Hamstring cues for erect trunk,  X 3 H20 3x ea  HS 3x ea   Runners gastroc   20H x 3  h20 3x X 3 cues to step closer to rail to avoid extended locked elbows     3 way hip   x 10 15x  Cues for core/abs X 20 20x     Open gate   x 10 15 X 20 20    Heel raises  2 x 10  20  20x    Squats    x 10 15 On bottom step, open palms, x 20     Step ups   20x fwd Feet moving bottom to 1st, open palms on railings, x 20 20x ea    Deep water bike   x 3 min        Deep water ski   x 2 min        March high knee   5 min      Tire run   3 min      Alt HS kicks    2 min      Total Treatment Time 21 min    ( pt arrived late) Late arrival   33 min  38 min Wrong time but able to be seen  40 min 43 min 38 min   Timed Code Treatment Minutes 21 min eval 33 min Aq Ex 38 aquex 40 min aq ex 1:1 3 18  min aq ex  Rest supervised tx 30 Therapeutic exercises  8 min  There acitivty     Education Addressed During This Session:     5.27.15: DC instructions, land HEP, [facility] program for [place]     05/10/14  Frequent cues for active core.  Discussed d/c poc.  Thinks insurance covers silver sneakers.  Discussed that those who have a plan in place before d/c do better with follow through.   Drink plenty of cold water   5.18.15:  Cont with mobility  exs in pool    Assessment/Progress toward Goals:   tol session well  No compliants  Plan:  DC AT THIS TIME SEE DC SUMMARY

## 2014-05-23 NOTE — Unmapped (Signed)
Patient is calling to speak with [name]/[employer] faxed over disability renewal  paperwork and needs to be filled out faxed back  As soon as possible .  Patient also need a referral for aquatic therapy class please fax referral to [phone] Attention Diona Browner .  Please call and advise patient when this has been taken care of.

## 2014-05-24 NOTE — Unmapped (Signed)
Advised patient Referral for aquatic therapy faxed and disability received Trish working on forms.-complete

## 2014-07-15 NOTE — Progress Notes (Signed)
Subjective:      Patient ID: Danielle Miles is a 58 y.o. female.    Blood pressure 132/84, pulse 78, temperature 97.8 ??F (36.6 ??C), temperature source Oral, resp. rate 22, height 5\' 4"  (1.626 m), weight 228 lb (103.42 kg).     HPI new patient here to established.  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  Currently [facility].   Prior PCP was [facility], but not fore several years.  Hx type 2 diabetes.  Last a1c was in 2013.  She does not test glucoses.  Takes metfromin 500mg  BID.    Her PMH was reviewed together as below.  Filed Vitals:    07/15/14 1037   BP: 132/84   Pulse: 78   Temp: 97.8 ??F (36.6 ??C)   Resp: 22     Wt Readings from Last 3 Encounters:   07/15/14 228 lb (103.42 kg)      BP Readings from Last 3 Encounters:   07/15/14 132/84      Patient Active Problem List   Diagnosis   ??? HTN (hypertension)- on prinzide. No hx of CAD, TIA, CVA or PVD. She had some chest pains recently and was evaluted by DR Corliss SkainsMikkelson.  Had normal ECHO 07/11/14 and negative stress test.  Study Date: 07/11/2014 02:28 PM    Summary:  Overall left ventricular ejection fraction is estimated to be 55-60%.  The left ventricular wall motion is normal.  The left ventricular function is normal.  The mitral valve leaflets are mildly thickened in appearance.  Right ventricular systolic pressure is normal at <35 mmHg.    Study Date: 07/11/2014 02:09 PM    Interpetation Summary:  The LV EF is 72%  Normal global and regional wall motion in all territories.    1.Non-diagnostic ECG for ischemia with pharmocologic stress.  2.Negative nuclear stress imaging for inducible ischemia.       ??? Type 2 diabetes mellitus (HCC) (dx 2013)- has been on metfromin alone.  Sugars unknown.  Does not test.  Has not been to ophto for dilated eye exam.  She has tingling in hands, but not feet.   ??? Overactive bladder- related to MS. On oxybutynin XR 20mg  qhs.     ??? Multiple sclerosis (HCC)- dx ~2000; [facility].   Is disabled.  Has had recent flares (RLE numbness, inabiltiy to walk).  Her symptoms resolved over about 5 days with steroids.  She does also take baclofen and gabapentin for spasms and leg pain.   ??? Fibromyalgia- dx by DR Fran LowesFarhey- started on gabapentin for this, but now only takes it for MS.  She denies this being an issue presently.      Current Outpatient Prescriptions   Medication Sig Dispense Refill   ??? lisinopril-hydrochlorothiazide (PRINZIDE;ZESTORETIC) 20-25 MG per tablet Take 1 tablet by mouth daily       ??? gabapentin (NEURONTIN) 300 MG capsule Take 300 mg by mouth 3 times daily       ??? metFORMIN (GLUCOPHAGE) 500 MG tablet Take 500 mg by mouth 2 times daily (with meals)       ??? oxybutynin (DITROPAN-XL) 10 MG CR tablet Take 10 mg by mouth daily       ??? Cholecalciferol (VITAMIN D3) 50000 UNITS CAPS Take by mouth once a week       ??? baclofen (LIORESAL) 10 MG tablet Take 10 mg by mouth 3 times  daily         No current facility-administered medications for this visit.        There is no immunization history on file for this patient.      Review of Systems   All other systems reviewed and are negative.       Objective:   Physical Exam   Constitutional: She is oriented to person, place, and time. She appears well-developed and well-nourished. No distress.   HENT:   Head: Normocephalic and atraumatic.   Right Ear: External ear and ear canal normal.   Left Ear: Tympanic membrane, external ear and ear canal normal.   Nose: Nose normal.   Mouth/Throat: Oropharynx is clear and moist. No oropharyngeal exudate.   Eyes: Conjunctivae and EOM are normal. Pupils are equal, round, and reactive to light. Right eye exhibits no discharge. Left eye exhibits no discharge.   Neck: Normal range of motion. Neck supple. No tracheal deviation present. No thyromegaly present.   Cardiovascular: Normal rate, regular rhythm, normal heart sounds and intact distal pulses.  Exam reveals no gallop and no friction rub.    No murmur  heard.  Pulmonary/Chest: Effort normal and breath sounds normal. No respiratory distress. She has no wheezes. She has no rales. She exhibits no tenderness.   Abdominal: Soft. Bowel sounds are normal. She exhibits no distension and no mass. There is no tenderness. There is no rebound and no guarding.   Musculoskeletal: Normal range of motion.   Feet normal: no lesions.  Sensation intact to SW monofilament bilaterally.    Lymphadenopathy:     She has no cervical adenopathy.        Right: No supraclavicular adenopathy present.        Left: No supraclavicular adenopathy present.   Neurological: She is alert and oriented to person, place, and time.   Skin: Skin is warm and dry. She is not diaphoretic.   Psychiatric: She has a normal mood and affect. Her behavior is normal. Judgment and thought content normal.   Nursing note and vitals reviewed.      Assessment:      1. Type 2 diabetes mellitus (HCC)  - control unknown.  Ordered glucometer and asked her to test once daily either fasting or 2 hrs PP.  Check labs as below; referred for eye exam.  Continue metformin 500 BID for now.  - Lipid Panel  - Hemoglobin A1C  - Microalbumin / Creatinine Urine Ratio  - HM DIABETES FOOT EXAM  - Ambulatory referral to Ophthalmology    2. HTN (hypertension)  - Blood pressure is at goal on current therapy; continue meds and monitoring.   - Comprehensive Metabolic Panel    3. Overactive bladder- related to MS  - is not in need of refills on anticholinergic agent, but she will address through her neurologist.    4. Multiple sclerosis (HCC)- dx ~2000  - on Rebif; per neurology.  Gabapentin and baclofen are also used for MS symptoms.    5. Fibromyalgia- dx by DR Fran LowesFarhey   - no longer taking meds for this; denies muscular pain currently.    6. Vitamin D deficiency  - assess current vit D level and will recommend an appropriate replacement dose.  Continue 50,000 IU weekly for now.   - Vitamin D 25 Hydroxy         Plan:      f/u 2 months to review  glucoses.

## 2014-07-16 LAB — COMPREHENSIVE METABOLIC PANEL
ALT: 52 U/L — ABNORMAL HIGH (ref 10–40)
AST: 36 U/L (ref 15–37)
Albumin/Globulin Ratio: 1.5 (ref 1.1–2.2)
Albumin: 4.3 g/dL (ref 3.4–5.0)
Alkaline Phosphatase: 87 U/L (ref 40–129)
Anion Gap: 15 (ref 3–16)
BUN: 9 mg/dL (ref 7–20)
CO2: 28 mmol/L (ref 21–32)
Calcium: 9.4 mg/dL (ref 8.3–10.6)
Chloride: 101 mmol/L (ref 99–110)
Creatinine: 0.6 mg/dL (ref 0.6–1.1)
GFR African American: >60 (ref >60)
GFR Non-African American: >60 (ref >60)
Globulin: 2.9 g/dL
Glucose: 130 mg/dL — ABNORMAL HIGH (ref 70–99)
Potassium: 4 mmol/L (ref 3.5–5.1)
Sodium: 144 mmol/L (ref 136–145)
Total Bilirubin: <0.2 mg/dL (ref 0.0–1.0)
Total Protein: 7.2 g/dL (ref 6.4–8.2)

## 2014-07-16 LAB — HEMOGLOBIN A1C
Hemoglobin A1C: 7.2 %
eAG: 159.9 mg/dL

## 2014-07-16 LAB — LIPID PANEL
Cholesterol, Total: 213 mg/dL — ABNORMAL HIGH (ref 0–199)
HDL: 58 mg/dL (ref 40–60)
LDL Calculated: 132 mg/dL — ABNORMAL HIGH (ref <100)
Triglycerides: 113 mg/dL (ref 0–150)
VLDL Cholesterol Calculated: 23 mg/dL

## 2014-07-16 LAB — MICROALBUMIN / CREATININE URINE RATIO
Creatinine, Ur: 271.5 mg/dL — ABNORMAL HIGH (ref 28.0–259.0)
Microalbumin Creatinine Ratio: 8.5 mg/g (ref 0.0–30.0)
Microalbumin, Random Urine: 2.3 mg/dL — ABNORMAL HIGH (ref <2.0)

## 2014-07-16 LAB — VITAMIN D 25 HYDROXY: Vit D, 25-Hydroxy: 59.5 ng/mL (ref >=30)

## 2014-07-19 NOTE — Telephone Encounter (Signed)
Pt has the acu med machine.  She needs the supplies for that machine. We ordered one touch.      Also wants to know if Dr. Briant Cedar can prescribe albuteral

## 2014-07-19 NOTE — Telephone Encounter (Signed)
Albuterol not on her list.

## 2014-07-19 NOTE — Telephone Encounter (Signed)
Test strips refilled. Patient would need an appt to discuss the need for albuterol. I do not see a breathing problem noted on her last OV.

## 2014-07-22 NOTE — Telephone Encounter (Signed)
PT is calling about diabetic machine,. She says she needs prescription for Accu-Med Nano meter and strips.  PT also needs script filled for Albuterol. Please advise

## 2014-07-22 NOTE — Telephone Encounter (Signed)
Diabetic testing supplies sent to pharmacy  The other day.

## 2014-07-24 NOTE — Telephone Encounter (Signed)
PT says her pharmacy still does not have the accu med nano testing supplies. Monitor or strips. Advised PT per previous messages supplies were already sent. PT says she is going to call pharmacy today and if they still say they don't have it she will have pharmacy call.

## 2014-07-24 NOTE — Telephone Encounter (Signed)
Called and spoke to steve the pharmacist, they have the monitor and strips for the patient, they will call her to tell her its ready

## 2014-07-26 NOTE — Telephone Encounter (Signed)
error 

## 2014-08-02 NOTE — Telephone Encounter (Signed)
LM for pt @ [phone] number was left for pt) about message told her to call with any questions.

## 2014-08-02 NOTE — Telephone Encounter (Signed)
PT wants to know if dosage of      pravastatin (PRAVACHOL) 40 MG TABS     Can be lowered to 10mg  or 20mg . Current dose makes her eyes burn. If so call into pharmacy.

## 2014-08-02 NOTE — Telephone Encounter (Signed)
This would be an unexpected side effect.  I would advise her to stop the medicine for 3-4 days and see if her eye symptoms clear up.  If they do, we will use a different medicine.  If not, then go back on the pravastatin and come in to diagnose the cause of her problem.

## 2014-09-10 NOTE — Telephone Encounter (Signed)
Pt was seen on 7-20 by Dr Briant CedarMattingly  He refilled her Metformin for 1000mg   She was previously taking 500 mg  She would like to know why the dosage was increased

## 2014-09-11 NOTE — Telephone Encounter (Signed)
Pt calling back she was in a funeral   Please call her back

## 2014-09-11 NOTE — Telephone Encounter (Signed)
I tired to call pt uinable to leave message if pt calls back please refer to lab letter

## 2014-09-11 NOTE — Telephone Encounter (Signed)
See other phone note

## 2014-09-12 NOTE — Telephone Encounter (Signed)
Called pt back and she said ok and I reminded her to bring in sugar log

## 2014-09-12 NOTE — Telephone Encounter (Signed)
Thanks.  The plan was to follow up 2 months after her initial appt (about 2 months ago) to review her diabetes control.  She has fibromyalgia, managed by rheumatology (Dr Fran Lowes)- that is the usual cause of fatigue, but we can review this at the time of her next visit.    Remind her to bring her glucose log to next visit.    Thanks.

## 2014-09-12 NOTE — Telephone Encounter (Signed)
Called pt with results read her lab letter to her  She will be going to [facility]  For eye exam   She wanted me to make sure  i sent you a message that she is still feeling very Tired she wanted to know what the next step would be

## 2014-09-23 NOTE — Unmapped (Signed)
Left message to call back to office to confirm the dosing of Gabapentin before escribing

## 2014-09-23 NOTE — Unmapped (Signed)
Spoke with patient. Requesting a new script for her Gabapentin. Please send to [facility].

## 2014-09-23 NOTE — Unmapped (Signed)
States taking TID, 300 mg gabapentin.

## 2014-09-24 MED ORDER — gabapentin (NEURONTIN) 300 MG capsule
300 | ORAL_CAPSULE | Freq: Three times a day (TID) | ORAL | Status: AC
Start: 2014-09-24 — End: 2015-01-03

## 2014-09-25 NOTE — Progress Notes (Signed)
Subjective:      Patient ID: Danielle Miles is a 58 y.o. female.    Blood pressure 130/78, pulse 80, temperature 98.2 ??F (36.8 ??C), temperature source Oral, resp. rate 22, height 5\' 4"  (1.626 m), weight 220 lb (99.791 kg).     HPI here for follow up diabetes.  a1c slightly high.  She saw dyd doctor yesterday at [facility]- has glaucoma and mild cataracts.  No retinopathy.  Need report.    Meds: on metformion alone.  2g day.  Dose was increased 3 months ago.  Does not have sugar log.  usu 120 in am.  After meals is in 130s.    seh has lost 8 lbs.    She line dances every day. She hs cut down on candy and sugar.  Less bread.  No known complications.    She was on pravastatin, but it made her sick and she stopped it after about a week.  Vitamin D was 59.      Lab Results   Component Value Date    CHOL 213* 07/16/2014     Lab Results   Component Value Date    TRIG 113 07/16/2014     Lab Results   Component Value Date    HDL 58 07/16/2014     Lab Results   Component Value Date    LDLCALC 132* 07/16/2014          Lab Results   Component Value Date    LABA1C 7.2 07/16/2014     Lab Results   Component Value Date    EAG 159.9 07/16/2014      Filed Vitals:    09/25/14 1121   BP: 130/78   Pulse: 80   Temp: 98.2 ??F (36.8 ??C)   Resp: 22     Body mass index is 37.74 kg/(m^2).  Wt Readings from Last 3 Encounters:   09/25/14 220 lb (99.791 kg)   07/15/14 228 lb (103.42 kg)      BP Readings from Last 3 Encounters:   09/25/14 130/78   07/15/14 132/84      Patient Active Problem List   Diagnosis   ??? HTN (hypertension)   ??? Type 2 diabetes mellitus (HCC) (dx 2013)   ??? Overactive bladder- related to MS   ??? Multiple sclerosis (HCC)- dx ~2000; [facility]   ??? Fibromyalgia- dx by DR Fran LowesFarhey       Current Outpatient Prescriptions   Medication Sig Dispense Refill   ??? metFORMIN (GLUCOPHAGE) 1000 MG tablet Take 1 tablet by mouth 2 times daily (with meals) 60 tablet 5   ??? albuterol (PROVENTIL HFA;VENTOLIN HFA) 108 (90 BASE) MCG/ACT inhaler Inhale 2  puffs into the lungs every 6 hours as needed for Wheezing or Shortness of Breath 1 Inhaler 0   ??? glucose blood VI test strips (ACCU-CHEK AVIVA) strip 1 each by In Vitro route daily As needed. 100 each 0   ??? gabapentin (NEURONTIN) 300 MG capsule Take 300 mg by mouth 3 times daily     ??? oxybutynin (DITROPAN-XL) 10 MG CR tablet Take 10 mg by mouth daily     ??? Cholecalciferol (VITAMIN D3) 50000 UNITS CAPS Take by mouth once a week     ??? baclofen (LIORESAL) 10 MG tablet Take 10 mg by mouth 3 times daily     ??? ONE TOUCH LANCETS MISC 1 each by Does not apply route daily 100 each 3   ??? glucose blood VI test strips (  ONE TOUCH ULTRA TEST) STRP Use once daily and as needed. 100 strip 5   ??? pravastatin (PRAVACHOL) 40 MG TABS Take 40 mg by mouth every evening 30 tablet 5   ??? lisinopril-hydrochlorothiazide (PRINZIDE;ZESTORETIC) 20-25 MG per tablet Take 1 tablet by mouth daily 30 tablet 5     No current facility-administered medications for this visit.        There is no immunization history on file for this patient.   Health Maintenance   Topic Date Due   ??? OPHTHALMOLOGY EXAM  12/12/1966   ??? TETANUS VACCINE ADULT (11 YEARS AND UP)  12/13/1967   ??? PNEUMOVAX 1 DOSE 19-64Y (1) 12/12/1974   ??? CERVICAL CANCER SCREENING  12/12/1977   ??? BREAST CANCER SCREENING  12/12/1996   ??? COLON CANCER SCREENING COLONOSCOPY  12/12/2006   ??? HEMOGLOBIN A1C  01/16/2015   ??? FOOT EXAM  07/16/2015   ??? LIPIDS  07/17/2015   ??? FLU VACCINE YEARLY (ADULT)  08/28/2015         Review of Systems No chest pains, dizziness, heart palpitations, dyspnea, lightheadedness, worsening edema.     Objective:   Physical Exam   Constitutional: She is oriented to person, place, and time. She appears well-developed and well-nourished.   Neck: Normal range of motion. Neck supple. Carotid bruit is not present. No thyromegaly present.   Cardiovascular: Normal rate, regular rhythm, normal heart sounds and intact distal pulses.    No murmur heard.  Pulmonary/Chest: Effort normal and  breath sounds normal. No respiratory distress. She has no wheezes. She has no rales.   Musculoskeletal: Normal range of motion. She exhibits no edema.   Neurological: She is alert and oriented to person, place, and time.   Skin: Skin is warm and dry.   Psychiatric: She has a normal mood and affect. Her behavior is normal. Judgment and thought content normal.       Assessment:      1. OSA (obstructive sleep apnea)- has CPAP, but not using.  - she will look up the company that services her machine for referral for machine tubing and maintenance.  discussed that good sleep is essential for good health and control of FM.    2. Type 2 diabetes mellitus without complication (HCC)  asses control with a1c. Encouraged compliance with diet, exercise, yearly eye exams for retinopathy, daily foot care/observation.     3. Hyperlipidemia  - discussed rationale for using statins in diabetics.  Try lova 40.     4. Other screening mammogram  - [facility] Digital Screen Bilateral; Future    5. Screen for colon cancer  - Shelva Majestic- Haberthier, Clydie Braun, MD         Plan:      F/u 3 months.

## 2014-09-30 NOTE — Unmapped (Signed)
Spoke with patient. Calling in regards to her Rebif. Requesting this be called into [facility] # [phone], needs formulary exception. If approved, wants the script faxed to [place], Fax # [phone]

## 2014-09-30 NOTE — Unmapped (Signed)
Patient calling again in regards to the message below. Also stated she is having trouble walking. Stated it started in her knees. Stated she is walking with a limp. Stated its her L leg. No injury to the knee/leg. Her knee hurt all weekend, had to use cane today.

## 2014-09-30 NOTE — Unmapped (Signed)
I tried to call insurance and she is in active-She is scheduled to see [facility] tomorrow.  She states that she does have current insurance and will call to get this straightened out.  Is going back and forth with Medicare and Medicaid.

## 2014-10-01 ENCOUNTER — Ambulatory Visit: Admit: 2014-10-01 | Payer: MEDICARE

## 2014-10-01 DIAGNOSIS — G35 Multiple sclerosis: Secondary | ICD-10-CM

## 2014-10-01 LAB — COMPREHENSIVE METABOLIC PANEL, SERUM
ALT: 57 U/L — ABNORMAL HIGH (ref 7–52)
AST (SGOT): 43 U/L — ABNORMAL HIGH (ref 13–39)
Albumin: 4.4 g/dL (ref 3.5–5.7)
Alkaline Phosphatase: 60 U/L (ref 36–125)
Anion Gap: 7 mmol/L (ref 3–16)
BUN: 12 mg/dL (ref 7–25)
CO2: 33 mmol/L (ref 21–33)
Calcium: 9.9 mg/dL (ref 8.6–10.3)
Chloride: 97 mmol/L — ABNORMAL LOW (ref 98–110)
Creatinine: 0.62 mg/dL (ref 0.60–1.30)
GFR MDRD Af Amer: 120 See note.
GFR MDRD Non Af Amer: 99 See note.
Glucose: 107 mg/dL — ABNORMAL HIGH (ref 70–100)
Osmolality, Calculated: 284 mosm/kg (ref 278–305)
Potassium: 3.7 mmol/L (ref 3.5–5.3)
Sodium: 137 mmol/L (ref 133–146)
Total Bilirubin: 0.4 mg/dL (ref 0.0–1.5)
Total Protein: 8.1 g/dL (ref 6.4–8.9)

## 2014-10-01 LAB — URINALYSIS, REFLEX TO CULTURE
Bilirubin, UA: NEGATIVE
Blood, UA: NEGATIVE
Glucose, UA: NEGATIVE mg/dL
Ketones, UA: NEGATIVE mg/dL
Nitrite, UA: NEGATIVE
Protein, UA: 100 mg/dL
RBC, UA: 1 /HPF (ref 0–3)
Specific Gravity, UA: 1.026 (ref 1.005–1.035)
Squam Epithel, UA: 4 /HPF (ref 0–5)
Trans Epithel, UA: 4 /HPF (ref 0–5)
Urobilinogen, UA: <2 mg/dL (ref 0.2–1.9)
WBC, UA: >100 /HPF (ref 0–5)
pH, UA: 5 (ref 5.0–8.0)

## 2014-10-01 LAB — CBC
Hematocrit: 44.8 % (ref 35.0–45.0)
Hemoglobin: 14.9 g/dL (ref 11.7–15.5)
MCH: 30.3 pg (ref 27.0–33.0)
MCHC: 33.2 g/dL (ref 32.0–36.0)
MCV: 91.2 fL (ref 80.0–100.0)
MPV: 10 fL (ref 7.5–11.5)
Platelets: 243 10*3/uL (ref 140–400)
RBC: 4.91 10*6/uL (ref 3.80–5.10)
RDW: 13.2 % (ref 11.0–15.0)
WBC: 10.1 10*3/uL (ref 3.8–10.8)

## 2014-10-01 LAB — DIFFERENTIAL
Basophils Absolute: 40 /uL (ref 0–200)
Basophils Relative: 0.4 % (ref 0.0–1.0)
Eosinophils Absolute: 242 /uL (ref 15–500)
Eosinophils Relative: 2.4 % (ref 0.0–8.0)
Lymphocytes Absolute: 2656 /uL (ref 850–3900)
Lymphocytes Relative: 26.3 % (ref 15.0–45.0)
Monocytes Absolute: 919 /uL (ref 200–950)
Monocytes Relative: 9.1 % (ref 0.0–12.0)
Neutrophils Absolute: 6242 /uL (ref 1500–7800)
Neutrophils Relative: 61.8 % (ref 40.0–80.0)

## 2014-10-01 LAB — URINE CULTURE

## 2014-10-01 LAB — THYROID FUNCTION CASCADE: TSH: 1.42 u[IU]/mL (ref 0.34–5.60)

## 2014-10-01 LAB — VITAMIN B12: Vitamin B-12: 322 pg/mL (ref 180–914)

## 2014-10-01 NOTE — Unmapped (Signed)
Date:  10/01/2014  Name: Melanie [name]  Age:  58 y.o.      Date Symptom   Onset/Outcome Brain MRI Spine MRI Tests DMT period A/E Meds   2000 Fatigue, cognitive G/P        2004 Blurry vision B/L G/I  Dx Melanie    Rebif IVMP   2013  Stable 04-now  PV -ve      03/2014 3/15 R leg W after cold                           Onset: Acute/Gradual , Outcome: Resolved/Improved/Progressed, MRI: Peri Ventricular/Juxta Cortical,      HPI:    10/01/14:  I had the pleasure of evaluating Melanie [name] who is here for evaluation of new symptoms.  She is on Rebif since 2004 and take is regulalry with no missed doses.  She developed new pain in her left knee and left leg, She also noticed increased numbness in that extremity which lasted about 3 days and it has started to improve.    04/09/14 Dr. Genelle Gather  On 4/2, she woke up and couldn't walk, felt her R leg was weak. Also lost sensation with tingling. She waited to see if this would get better. She called on 4/6 and Dr. Genelle Gather thought she is probably having exacerbation. Labs were done with CBC, BMP, UA which did not show evidence of infection. Nurse visited her at home to get IV access. IV solumedrol was started on 4/12. She got the second dose today. Since about Friday last week, sensation was coming back. Weakness is improved but not back to baseline. She is able to walk. Dancing is still hard. She is still on Rebif, doing ok with the injection. This is a first real exacerbation since 2004.   Recent exacerbation happened after recent URI.    02/27/13  She was working as OR Haematologist at [employer]. She was feeling very tired with some cognitive and memory problem around 2000. Thne she had an episode of visual change in both eye in 2004 with partial recovery. She was Dx with Melanie based on MRI. She was started on Rebif . She thinks it works and tolerating it well however does not like injection. Her last MRI was from April 2013.   Last year she lost 25 Lbs by diet and exercise after she was diagnosed  with Diabetes but she regain that weight. She is planning to go back in diet this week.     I reviewed Dr. Richardson Chiquito note from 03/09/2012:  I had the pleasure of re-evaluating Melanie [name], a 58 year old AA female with RRMS, for a return visit in the Melanie clinic. Pt is currently on Rebif and is tolerating it well. She has not had an MRI since January 2012. Since pts last visit she feels her condition is getting worse. She is experiencing cognitive issues including concentration and memory issues. She has also started to urinate uncontrollably and has missed work as a result. Pt is having trouble walking due to pain in both knees and L hip pain. She also complains of stress at work which causes her to be depressed.   58 year old AA female with RRMS, for a return visit in the Melanie clinic. Pt is currently on Rebif and is tolerating it well. She has not had an MRI since January 2012. Since pts last visit she feels her condition is getting worse. She is  experiencing cognitive issues including concentration and memory issues. She has also started to urinate uncontrollably and has missed work as a result. Pt is having trouble walking due to pain in both knees and L hip pain. She also complains of stress at work which causes her to be depressed.   Due to pts complaints of cognitive issues, we will refer her for a neuropsych eval. We will also give her the contact number to the social worker for the [facility]. I would like to obtain and MRI of the brain and c spine.      Review of System:    Pain:    R. Knee and hip  Sensory Numbness r. foot  Fatigue: bothersome  Sleep:       4 to 5 hrs uninterrupted,   Sleep Apnea: No longer uses CPAP    STM   Main symptom she get disability   Urinary Incontinece Oxybutinin helps, she has done pelvic exercise, this was one the reason she stopped working   Walking Difficulty Tired and back pain, can do more after exercise   Imbalance  Trip over but no fall   Fatigue                         Exhausted    Medications:      Current Outpatient Prescriptions   Medication Sig   ??? albuterol Inhale 2 puffs into the lungs. Every 4 to 6 hours as needed.    ??? baclofen Take 1 tablet (10 mg total) by mouth 3 times a day.   ??? blood sugar diagnostic Test blood sugars up to twice a day   ??? cholecalciferol (vitamin D3) Take 1 tablet (1,000 Units total) by mouth 2 times a day as needed.   ??? gabapentin Take 1 capsule (300 mg total) by mouth 3 times a day.   ??? ibuprofen Take 800 mg by mouth every 8 hours as needed.   ??? inhalational spacing device by Misc.(Non-Drug; Combo Route) route. AEROCHAMBER PLUS.  Use with MDI    ??? interferon beta-1a (albumin) Inject 0.5 mLs (44 mcg total) subcutaneously every Monday, Wednesday, and Friday.   ??? lisinopril-hydrochlorothiazide Take 1 tablet by mouth daily.   ??? metFORMIN Take 1 tablet (1000mg ) by mouth 2 times a day with meals.   ???     ??? oxybutynin Take two tablets by mouth daily     No current facility-administered medications for this visit.      Allergies: Compazine and Prochlorperazine    History:    PMHx:  has a past medical history of Asthma; Multiple sclerosis; Hypertension; and Diabetes mellitus.     PSHx:  has past surgical history that includes Knee surgery; Appendectomy; and Hysterectomy.    Social Hx: reports that she has never smoked. She has never used smokeless tobacco. She reports that she drinks alcohol. She reports that she does not use illicit drugs. She lives by herself. She feels she is safe but has to take extra time. Feels depressed being lonely. Grandkids lives cross the street and come and visit her. She goes to [facility], do dance club and try to stay active.     ZOX:WRUEAV history includes Alcohol abuse in her father and other; Brain cancer in her other; COPD in her father; Cancer in her father, maternal grandmother, mother, and sister; Diabetes in her daughter, paternal aunt, paternal grandfather, and paternal uncle; Skin cancer in her other; Stomach cancer in  her other;  Throat cancer in her other.    Physical Exam:    BP 106/70   Pulse 82   Ht 5' 4 (1.626 m)   Wt 221 lb (100.245 kg)   BMI 37.92 kg/m2    General: NAD  Resp:     Clear Bilaterally, regular unlabored breaths  CV:     RRR  No Edema, no skin rashes    Mental Status:Patient registered 3 objects without difficulty and recalled 3/3 after five minutes.  The patient was fully oriented and had a normal fund of knowledge.  Cranial Nerves: Color Vision12/12 OD and 10/12 OS, best corrected visual acuity was 20/20 OD and 20/20 OS.  There was no afferent pupillary deficit.  Funduscopic exam revealed normal optic nerve heads.   Horizontal and vertical eye movements were full.  There was no nystagmus.  Facial sensation was intact to pinprick.  There was no facial weakness.  Hearing was intact to finger rubbing bilaterally. Palate moved fully and symmetrically.  Tongue protruded in the midline.  There was no dysarthria.    Motor:   Strength:R-L (MRC Scale)   Deltoid  5 - 5   Hip Fx   4+ - 5  Biceps   5 - 5   Knee Ex  4+ - 5   Triceps  5 - 5   Knee Fx  4+ - 5   Wrist Ex  5 - 5   Plantar Fx 4+ - 5   Grip         5 - 5   Ankle dorsiflex 4+ - 5    Tone was normal    Reflexes: R / L (MRC Scale)   Biceps  2 / 2   Quadriceps 2 / 2   Triceps  2 / 2   Achilles  2 / 2   Brachoradialis 2 / 2   Toes   Up / down     Coordination: There was no ataxia on finger to nose or heel to shin testing.  Sensation: Decreased vibration BLE.    Position and pinprick sensation were intact in all four extremities.  Gait and station:  Unable to do tandem walk and hopping on either foot.   T25-FW date Time assistance   02/26/13  6 No   04/09/14 7.5 No   10/01/14 8.26 No  Tests:    04/08/14  MRI head:   1. No significant interval change in the moderate burden of T2 signal prolongation involving the periventricular, deep, and central pontine white matter consistent with the clinical diagnosis of multiple sclerosis. No new lesions, restricted diffusion, or  enhancement is identified.   2. Small amount of layering fluid signal in the right maxillary sinus.       MRI cervical spine:   1. No evidence of cord signal abnormalities or enhancement.   2. Very mild multilevel degenerative discogenic changes without evidence of central canal or neuroforaminal stenosis, unchanged.       MRI thoracic spine:   1. No evidence of cord signal abnormalities or enhancement.   2. Very mild degenerative discogenic changes without evidence of central canal or neuroforaminal stenosis, involving the T3-T4 through T7-T8 levels.    04/08/2014 COMPARISON: MRI dated 03/27/2012.  MRI head:  1. No significant interval change in the moderate burden of T2 signal prolongation involving the periventricular, deep, and central pontine white matter consistent with the clinical diagnosis of multiple sclerosis. No new lesions, restricted diffusion,   or enhancement is identified.  2. Small amount of layering fluid signal in the right maxillary sinus.  MRI cervical spine:  1. No evidence of cord signal abnormalities or enhancement.  2. Very mild multilevel degenerative discogenic changes without evidence of central canal or neuroforaminal stenosis, unchanged.  MRI thoracic spine:  1. No evidence of cord signal abnormalities or enhancement.  2. Very mild degenerative discogenic changes without evidence of central canal or neuroforaminal stenosis, involving the T3-T4 through T7-T8 levels.      Lab Results   Component Value Date    WBC 10.0 04/02/2014    WBC 10-20* 04/02/2014    HGB 13.9 04/02/2014    HCT 43.5 04/02/2014    MCV 94.1 04/02/2014    PLT 229 04/02/2014     Lab Results   Component Value Date    CREATININE 0.69 04/02/2014    BUN 13 04/02/2014    NA 136 04/02/2014    K 3.6 04/02/2014    CL 96* 04/02/2014    CO2 28 04/02/2014     Lab Results   Component Value Date    ALT 72* 04/02/2014    AST 60* 04/02/2014    ALKPHOS 80 04/02/2014    BILITOT 0.5 04/02/2014       Lab Results   Component Value Date    VITD25H 25.9* 02/27/2013     07/16/14  Vit D 25 59.5    Assessment/Plan:       Libbie Bartley is a 58 y/o with a diagnosis of Melanie who is on Rebif for several years and has remained radiologically stable, her last MRI was in April 2014.  She presents today with c/o left knee pain for the last 4 years which was aggravated by walking and worsened later to include the hip.  She also has c/o ongoing numbness to the left foot.  Based on her presentation and the fact that her pain was aggravated with movement and putting weight on her leg, her knee pain is most likely of musculoskeletal origin and I do not feel that this is related to the Melanie. I have advised the patient to take NSAIDs for the knee pain but to follow up with her PCP.  The numbness in her foot is not new and is likely related to diabetic neuropathy.  She currently takes Gabapentin but did not want to increase as she did not feel that this had significantly worsened to warrant increasing medications.  However, I still ordered UA, CBC, Diff and CMP, to r/o infection which can sometimes cause worsening of Melanie symptoms although unlikely that any of her symptoms are related to the Melanie.  The patient expressed that she is not interested in switching therapies at this point even if a repeat MRI showed new disease activity, I will therefore continue with current management on Rebif and continue to monitor her clinically with plan to have her f/u in 3 months and repeat MRI in April 2015.     1. Multiple sclerosis  - Continue Rebif  - Differential; Standing  - CBC; Standing  - Urinalysis, Reflex to Culture; Future  - Comprehensive Metabolic Panel, Serum; Future  - TSH, Reflex FT4; Future  - Vitamin B12; Future  - Amb referral to Physical Therapy    2. Knee pain, acute, left  -Advised to try NSAIDs    3. Gait difficulty  - Amb referral to Physical Therapy    4. Numbness and tingling  - TSH, Reflex FT4; Future  - Vitamin B12; Future  Medical Decision Making:   * review/order clinical lab tests   * review/order  radiology tests   * review/order other diagnostic or tx interventions   * obtain old records or history from another person   * review and summarization of old records   * independent review of data- e.g. image- specimen   * Permanent chart problem/surgery list reviewed   * Permanent chart chronic med/ allergy list reviewed   * Permanent chart social/family history reviewed   * reviewed films     Risks & Benefits:   * Risks, benefits and treatment options discussed with patient.   Risk of complications: moderate   Time spent with patient:   New: 45 min   More than 50% of this time was spent discussing:   * Diagnosis   * Management   * Treatment Plan   * Diagnostic Results as indicated above    Kristie Cowman CNP  [facility]   [address]   [place], [place] [address]????  [phone]

## 2014-10-01 NOTE — Unmapped (Addendum)
The symptoms are more suggestive of musculoskeletal origin and I recommend physical therapy.    I also recommend that you have your knee evaluated as this may be causing some of the other pain.    I do not feel that we should repeat your MRI especially in light of the fact that it is not going to change current management since you are not ready to switch therapy.    =======================================================================  I would still like to rule out infection as sometimes worsening of old symptoms that last for more than 24 hours can be due to  infection/metabolic causes (Psuedo-exacerbation). Recommend:   CBCdiff, CMP and UA reflex culture   Call back after test to discuss next step     =======================================================================  Take over the counter Vit D 2000IU daily    F/U in 3 months unless symptoms worsen  ======================================================================    Healthy life style can significantly change the out come of Multiple Sclerosis. I strongly recommend you to consider:   1. Low salt and low fat diet  2. Routine exercise (swimming, yoga, ...)  3. Vitamin D level goal > 50   Avoid alcohol and smoking    ======================================================================  I strongly recommend resuming use of CPAP machine for your sleep apnea and to help with sleep quality    Advise to do the following   ?? Maintain a consistent sleep schedule. Develop a bedtime routine to prepare yourself for sleep.   Eliminate naps during the day.   No caffeine consumption after noon.   Keep the bedroom for sleep only.   If unable to fall asleep or stay asleep within 20 minutes, go into another room and perform a relaxing activity.   Read the book Say Good Night to Insomnia by Leeann Must, PhD  Daily exercise can improve sleep. Gentle exercise like walking can help you feel tired. Try not to exercise 5-6 hours before bed, as it can be stimulating.      Hunger can disturb sleep. A light snack before bedtime can be helpful.   Nicotine is also a stimulant, and it has been demonstrated that chronic cigarette smokers have experienced significantly improved sleep when they quit.   Alcohol use has actually been shown to reduce sleep quality.   Creating a routine, such as a consistent bedtime and waketime can help improve sleep.   Unnecessary noise and light can be disruptive to sleep. Avoid keeping the television on during sleep, and use dark shades for windows to help. Avoid smart-phone use in bed as the light may be disruptive to sleep.

## 2014-10-07 MED ORDER — ibuprofen (ADVIL,MOTRIN) 600 MG tablet
600 | ORAL_TABLET | Freq: Four times a day (QID) | ORAL | Status: AC | PRN
Start: 2014-10-07 — End: 2014-11-06

## 2014-10-07 NOTE — Unmapped (Signed)
I reviewed her labs which were unremarkable, Urine culture revealed mixed skin flora.  No additional workup necessary.  As discussed at her last visit I do not feel that her knee pain is neurological and I recommend that she discusses this with her PCP as these symptoms are more suggestive of musculoskeletal origin .  I recommended physical therapy and just following up with [place] in 6 months for MS management.

## 2014-10-07 NOTE — Unmapped (Signed)
Requests lab results done last week.  Also requests scrip for Ibuprofen 600 or 800 mg be sent to [address]

## 2014-10-08 ENCOUNTER — Encounter

## 2014-10-08 LAB — POCT URINALYSIS DIPSTICK
Bilirubin, UA: NEGATIVE
Blood, UA POC: NEGATIVE
Glucose, UA POC: NEGATIVE
Ketones, UA: NEGATIVE
Nitrite, UA: POSITIVE
Protein, UA POC: NEGATIVE
Spec Grav, UA: 1.02
Urobilinogen, UA: 2
pH, UA: 7

## 2014-10-08 MED ORDER — SULFAMETHOXAZOLE-TRIMETHOPRIM 800-160 MG PO TABS
800-160 MG | ORAL_TABLET | Freq: Two times a day (BID) | ORAL | Status: AC
Start: 2014-10-08 — End: 2014-10-18

## 2014-10-08 NOTE — Progress Notes (Signed)
Subjective:      Patient ID: Danielle Miles is a 58 y.o. female.    Blood pressure 126/82, pulse 76, temperature 98.2 ??F (36.8 ??C), temperature source Oral, resp. rate 20, height 5\' 4"  (1.626 m), weight 226 lb (102.513 kg).     HPI here for concerns of urine infection.  Reports increased incontinence for past couple weeks.  Has hx MS and is on oxybutynin for OAB.  No dysuria. + frequency.  + urgency.   No f/c. No n/v/d.  No new back pain.      Also c/o knee pain on left.  Started perhaps 10 yrs ago.  Twisted it playing volleyball.  Had acl tear and ligament strain.  [facility]  Had arthroscopic surgery of some kind, but does not know details.    After the surgery she healed up and did fine, except for aching with weather changes.    3-4 yrs ago she had increased pain in knee and saw Dr Lyda PeroneKirk again.  Was given steroid injection with good results.  She says she had no pain until 2 weeks ago (except with bad weather).  No injury or strain.  Pain located to anterior aspect radiates up and down.  Pain is worse with walking steps.    Pain better with resting and icing it.    Ibuprofen helped.   No swelling of knee.    She was able to dance this past weekend, but had to ice it between dances.  She says her previous ortho was not on her health care plan- but it has changed recently and he may be now.  she has not attempted to contact Dr Lyda PeroneKirk. Says she requires a referral now.    Filed Vitals:    10/08/14 1526   BP: 126/82   Pulse: 76   Temp: 98.2 ??F (36.8 ??C)   Resp: 20     Body mass index is 38.77 kg/(m^2).  Wt Readings from Last 3 Encounters:   10/08/14 226 lb (102.513 kg)   09/25/14 220 lb (99.791 kg)   07/15/14 228 lb (103.42 kg)      BP Readings from Last 3 Encounters:   10/08/14 126/82   09/25/14 130/78   07/15/14 132/84      Patient Active Problem List   Diagnosis   ??? HTN (hypertension)   ??? Type 2 diabetes mellitus (HCC) (dx 2013)   ??? Overactive bladder- related to MS   ??? Multiple sclerosis  (HCC)- dx ~2000; [facility]   ??? Fibromyalgia- dx by DR Fran LowesFarhey    ??? OSA (obstructive sleep apnea)- has CPAP, but not using.   ??? Hyperlipidemia      Current Outpatient Prescriptions   Medication Sig Dispense Refill   ??? lovastatin (MEVACOR) 40 MG tablet Take 1 tablet by mouth daily 30 tablet 3   ??? metFORMIN (GLUCOPHAGE) 1000 MG tablet Take 1 tablet by mouth 2 times daily (with meals) 60 tablet 5   ??? albuterol (PROVENTIL HFA;VENTOLIN HFA) 108 (90 BASE) MCG/ACT inhaler Inhale 2 puffs into the lungs every 6 hours as needed for Wheezing or Shortness of Breath 1 Inhaler 0   ??? glucose blood VI test strips (ACCU-CHEK AVIVA) strip 1 each by In Vitro route daily As needed. 100 each 0   ??? gabapentin (NEURONTIN) 300 MG capsule Take 300 mg by mouth 3 times daily     ??? oxybutynin (DITROPAN-XL) 10 MG CR tablet Take 10 mg  by mouth daily     ??? Cholecalciferol (VITAMIN D3) 50000 UNITS CAPS Take by mouth once a week     ??? baclofen (LIORESAL) 10 MG tablet Take 10 mg by mouth 3 times daily     ??? ONE TOUCH LANCETS MISC 1 each by Does not apply route daily 100 each 3   ??? glucose blood VI test strips (ONE TOUCH ULTRA TEST) STRP Use once daily and as needed. 100 strip 5   ??? pravastatin (PRAVACHOL) 40 MG TABS Take 40 mg by mouth every evening 30 tablet 5   ??? lisinopril-hydrochlorothiazide (PRINZIDE;ZESTORETIC) 20-25 MG per tablet Take 1 tablet by mouth daily 30 tablet 5     No current facility-administered medications for this visit.      Immunization History   Administered Date(s) Administered   ??? Pneumococcal Polysaccharide (Pneumovax23) 09/25/2014      Health Maintenance   Topic Date Due   ??? OPHTHALMOLOGY EXAM  12/12/1966   ??? TETANUS VACCINE ADULT (11 YEARS AND UP)  12/13/1967   ??? BREAST CANCER SCREENING  12/12/1996   ??? COLON CANCER SCREENING COLONOSCOPY  12/12/2006   ??? HEMOGLOBIN A1C  01/16/2015   ??? FOOT EXAM  07/16/2015   ??? LIPIDS  07/17/2015   ??? FLU VACCINE YEARLY (ADULT)  07/28/2015   ??? PNEUMOVAX 1 DOSE 19-64Y (2) 12/12/2021          Review of Systems As above     Objective:   Physical Exam   Constitutional: She is oriented to person, place, and time. She appears well-developed and well-nourished.   Neck: Normal range of motion. Neck supple. Carotid bruit is not present. No thyromegaly present.   Cardiovascular: Normal rate, regular rhythm, normal heart sounds and intact distal pulses.    No murmur heard.  Pulmonary/Chest: Effort normal and breath sounds normal. No respiratory distress. She has no wheezes. She has no rales.   Abdominal: Soft. She exhibits no distension. There is tenderness (suprapubic tenderness). There is no guarding.   Musculoskeletal: Normal range of motion. She exhibits no edema.   L knee: medial JLT. No effusion.  + flexor lag with pain at end of ROM (~90 deg).  Anterior/medial pain with valgus stress.    No crepitous.   Neurological: She is alert and oriented to person, place, and time.   Skin: Skin is warm and dry.   Psychiatric: She has a normal mood and affect. Her behavior is normal. Judgment and thought content normal.   Nursing note and vitals reviewed.      Assessment:      UTI: ua + for nitrites adn leukos. Will culture and treat with bactrim.    OAB: continue oxybutynin.  Knee pain left: has restriction of ROM.  First check plain films for bony abnormality.  May need MRI xr normal.  Advised to limit motion/activity.      Plan:      As above

## 2014-10-08 NOTE — Unmapped (Signed)
Spoke with patient she is scheduled today with her PCP and will schedule with PT-complete

## 2014-10-09 ENCOUNTER — Telehealth

## 2014-10-09 ENCOUNTER — Encounter

## 2014-10-09 NOTE — Telephone Encounter (Signed)
Pt is calling to check on the results of her XRay she had done yesterday.  Please call

## 2014-10-09 NOTE — Telephone Encounter (Signed)
Pt informed MRI shoed degenerative chagnes.  Does not really explain her symptoms of flexor lag.  Need to proceed to MRI before she has therapy due to restriction in ROM.      Staff: please help her get MRI scheduled on her left knee.  Thanks. Order is on [facility] [employer]'s desk.

## 2014-10-09 NOTE — Telephone Encounter (Signed)
Results in chart

## 2014-10-10 NOTE — Telephone Encounter (Signed)
Called pt with  Xray results and notified she should get MRi done and not to start PT until MRI results  I will fax MRI  To scheduling

## 2014-10-10 NOTE — Progress Notes (Signed)
Resulted/Reviewed

## 2014-10-11 ENCOUNTER — Telehealth

## 2014-10-11 MED ORDER — LORAZEPAM 0.5 MG PO TABS
0.5 MG | ORAL_TABLET | ORAL | Status: DC
Start: 2014-10-11 — End: 2016-10-27

## 2014-10-11 NOTE — Telephone Encounter (Signed)
Pt is having MRI of knee  Order was written wrong  This should be done w/out contrast only  Please correct and resend

## 2014-10-11 NOTE — Progress Notes (Signed)
Please call in ativan for MRI.  Thanks.

## 2014-10-11 NOTE — Telephone Encounter (Signed)
New order faxed

## 2014-10-11 NOTE — Telephone Encounter (Signed)
Order adjusted.  Please fax or notify imaging. Thanks.

## 2014-10-14 ENCOUNTER — Encounter

## 2014-10-16 MED ORDER — BUPIVACAINE HCL (PF) 0.25 % IJ SOLN
0.25 % | Freq: Once | INTRAMUSCULAR | Status: AC
Start: 2014-10-16 — End: 2014-10-16
  Administered 2014-10-16: 19:00:00 75 mL via INTRADERMAL

## 2014-10-16 NOTE — Progress Notes (Signed)
[name]  [id number]  October 16, 2014    Chief Complaint   Patient presents with   ??? Knee Pain     Left knee pain for 2-3 weeks. pt states that she has MS. pt states that she is going out of country Monday.        History: The patient is a 58 year old pleasant female who is here for evaluation of her left knee.  The patient has had left knee pain for approximately the last 3 weeks.  She denies any specific injury.  The patient does have multiple sclerosis.  She has been participating in therapy.  She has been icing and elevating the leg is much as possible.  It should be noted the patient has had similar episodes in the past and has received injections.  The patient did have an MRI scan completed.    The patient's  past medical history, medications, allergies,  family history, social history, and review of systems have been reviewed, and dated and are recorded in the chart.    Vitals:  Pulse 78   Resp 18   Ht 5\' 4"  (1.626 m)   Wt 226 lb (102.513 kg)   BMI 38.77 kg/m2   Breastfeeding? No    Physical: Danielle Miles appears well, she is in no apparent distress, she demonstrates appropriate mood & affect. She is alert and oriented to person, place and time. Examination of the left lower extremity reveals no pain with range of motion of the hip. She has generalized tenderness to palpation about the medial joint line of the left knee.  McMurray's did cause pain medially.  Range of motion is from 0 degrees to 115 degrees. Strength is 4+ to 5/5 for all muscle groups about the left knee. There is patellofemoral crepitus with range of motion of the left knee. Varus and valgus stressing of the knees reveals no evidence of instability. There is a small effusion in the left knee. Anterior drawer and Lachman are negative bilaterally.   Examination of the skin reveals no rashes, ulceration, or lesion, bilaterally in the lower extremities. Sensation to both lower extremities is grossly intact. Exam of both feet reveals  pedal pulses intact and brisk cap refill. Patient is able to dorsiflex and wiggle all toes. Deep tendon reflexes of the lower extremities are normal and symmetric.  The patient does have a well-healed anterior longitudinal midline scar consistent with ACL reconstruction.    MRI: The MRI scan of the left knee was extensively reviewed.  The ACL is intact.  There is evidence of a complex medial meniscal tear.  There is moderate medial joint space narrowing and significant medial compartment chondromalacia.  There is moderate chondral malacia within the patellofemoral compartment.  2 views of the left knee obtained previously were extensively reviewed.  X-rays confirm moderate medial joint space narrowing.    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 After a Betadine prep of the joint, 3 cc of 0.25% Marcaine and 2 cc of 40 mg Depo-medrol were injected into the knee. The patient tolerated the injection rather well. The patient was instructed to follow up for any signs of infection.Natural history and expected course discussed. Questions answered.  Reduction in offending activity.  OTC analgesics as needed. The patient will follow up with me in 6 weeks.

## 2014-10-16 NOTE — Progress Notes (Signed)
Depo-Medrol:  NDC: 0009-0280-03  Lot #: [id number]  Expiration Date: 5/18

## 2014-10-18 ENCOUNTER — Encounter: Admit: 2014-10-18 | Discharge: 2014-10-18 | Payer: MEDICARE

## 2014-10-18 DIAGNOSIS — G35 Multiple sclerosis: Secondary | ICD-10-CM

## 2014-10-18 NOTE — Unmapped (Signed)
Physical Therapy Initial Evaluation and Plan of Care  Name: Melanie [name]     Date of Birth: 09-03-1956      MRN: [id number]    Date of evaluation: 10/18/2014  Referring Physician: Raeanne Gathers, MD  [address]  [address]  [place], [place] [address] Phone: [phone] Fax: [phone]  Specific Order: MS and gait difficulty   Date of Onset: 3 weeks ago  Primary Medical Diagnosis:   1. Gait abnormality    2. Multiple sclerosis      Insurance: Payor: EXCHANGE / Plan: EXCHANGE CARESOURCE JUST 4 ME / Product Type: *No Product type* /     Subjective/History:   History of Current Problem/Reason for Referral: Melanie [name] is a 58 y.o. female who presents today with chief complaint of left knee pain. Tinking she had an exacerbation but had MRI (this week) for L knee and found to have +meniscus tear.  Had a cortisone shot and feeling a lot better but trying to stay off knee. Still having a little pain.  Leaving for vacation on Monday.      Mechanism of Injury: no specific mechanism of injury but notes she does line dancing and may have been from that.  MS diagnosed in 2004. Last exacerbation 3 weeks ago when knee pain also started.     PMH/PSH:   Past Medical History   Diagnosis Date   ??? Asthma    ??? Multiple sclerosis      Dx 2000   ??? Hypertension    ??? Diabetes mellitus    ,   Past Surgical History   Procedure Laterality Date   ??? Knee surgery     ??? Appendectomy     ??? Hysterectomy       not sure what second type surgery she had. Had 2 surgeries, maybe partial hys then went back and toook the cervix.     Medications: She has a current medication list which includes the following prescription(s): albuterol, baclofen, blood sugar diagnostic, gabapentin, ibuprofen, inhalational spacing device, interferon beta-1a (albumin), lisinopril-hydrochlorothiazide, metformin, and oxybutynin.  Work Status:Permanent Disability  Living Environment: house with 2 levels but able to stay on bottom level.     Level of assistance Available:  lives alone   Household Barriers: shower seat   Equipment at home: Using a cane for long distances, but able to walk without AD.    Prior Treatment:   Prior Level of Function: line dancing everyday of week except thursday, walking without cane  Current Level of Function: not walking as much, using SPC, no longer line dancing.   Precautions/Contraindications: heat sensitive     Objective:seated   Vital Signs: BP / PR    Other:    Mental Status: alert, appears stated age and cooperative  Vision: glasses    Pain:   Level (0-10): 1-2/10 at present;  2-4/10 at worst since injection, 8/10 at worst prior to injection;  1-2/10 at best in past month  Location: L knee  Quality: constant, dull and aching / sharp shooting when walking   Relieved By: aspircream and ice/elevation    Exacerbated By: walking   Additional Comments: has only walked 30-40 ft at a time before rest.     Sensation:  WFL     Posture:    LE   Strength: normal or tested as follows:  Hip flexion:  Left: 4-/5 Right: 4/5 (4+/5 SLR BLE)  Hip abduction:  Left: 4-/5 pain Right: 5/5  Hip extension:  Left: 4+/5 Right: 4+/5  Knee flexion:  Left: 4+/5 Right: 4+/5  Knee extension: Left: 5/5 Right: 5/5  Ankle dorsiflexion: Left: 5/5 Right: 5/5  Ankle plantarflexion: Left: 4+/5 Right: 5/5     LE Range of Motion: normal or tested as follows:   Hip ER:  Left: diminished range of motion  Right: full range of motion   Hip IR:   Left: diminished range of motion  Right: diminished range of motion    Hip extension:  Left: diminished range of motion  Right: diminished range of motion  Knee flexion:  Left: 105 deg     Right: 120 deg  Knee extension: Left: lacking 5 deg    Right: 0 deg  Ankle dorsiflexion: Left: full range of motion   Right: full range of motion    Modified Ashworth:0, No increase in tone  Location:      Bed Mobility: Independent   Flexibility: HS 90/90: lacking 30 deg R;L   Tight ITBand L>R, lateral tracking of patella, tender to M/L joint lines and with patella  mobs = inc gaurding    Standing:   Balance:   - Sitting WFL  - Standing NT  Coordination:  no dysmetria or tremor noted  Gait: Antalgic    Stairs:  Specialized Tests: n/a  Additional Assessment Information: discussed knee stabilizer soft brace for pt to purchase prior to leaving for her trip to [place] on Monday. Also issued and verbally and demo reviewed quad strengthening HEP.      Assessment:   Assessment/Problem List: Patient arrives to PT eval with c/o Left knee pain with resport of MRI results of +meniscus tear presenting with decreased L hip strength, lateral tracking of patella, pain in M/L joint knee lines of L knee and decreased knee ROM contributing to functional limitations including antalgic gait pattern and decreased tolerance to ambulating and PLOF recreational activity such as line dancing. Patient would benefit from skilled PT to address the aforementioned deficits.   Rehabilitation Potential: Based on this therapist's assessment, MAYRE BURY has the following rehabilitation potential for the PT goals stated below: : good  Plan Of Care:      Patient/Family Goals: By discharge, pt would like to: walk without pain.   Short Term/Long term Goals:   time frame 4 wks Initial Status 10/18/2014   patient will perform home exercise program based on therapy recommendations independently to maintain benefits gained in physical therapy. Issued quad strengthening program today since pt is leaving for a week on vacation and won't be return to PT until return.    patient will improve ITB flexibility / ROM to WNL in order to decrease lateral tracking of patella. Lateral tracking of L knee; + ober's LLE   Patient will improve L knee ROM to = R knee in order to restore normalized joint mobility Knee flexion: Left: 105 deg    Right: 120 deg  Knee extension:Left: lacking 5 deg   Right: 0 deg   Patient will improve L hip flexion and abduction strength for improved L hip stabilization.  4-/5 with pain to resistance  testing   Patient will complete test with >924ft with LRAD without c/o pain for return to community ambulation distances.  Walking 30-60ft at a time before pain   Patient will report decreased pain to 0/10 on average for return to line dancing.  1-2/10 at present;    2-4/10 at worst since injection,   8/10 at worst prior to injection;  1-2/10 at best in past month  Location: L knee     Plan:    Treatment to Include: Gait Training  Orthotic assessment  Home exercise program for quad strengthening  Therapeutic Exercise  Therapeutic Activities  Neuromuscular Re-education  Modalities  Manual Therapy     Patient/Family Agree to Treatment: Yes      Treatment Diagnosis (ICD-9 Code):   1. Gait abnormality    2. Multiple sclerosis    3. Knee pain, acute, left       Therapy Frequency/Duration: Patient to receive skilled PT services up to 2 times per week for up to 4 weeks or up to 8 visits starting from the first scheduled follow up visit within this plan of care.     Therapist Signature: Yevonne Aline, PT, DPT  Date: 10/18/2014    Physician Certification   I certify that the above patient is under my care an requires the above services. These professional services are to be provided from an established plan, related to the diagnosis and reviewed by me every 90 days.     Additional comments/revisions:     Physician Name: Raeanne Gathers, MD    Signature_______________________________________ Date: _______

## 2014-10-18 NOTE — Unmapped (Signed)
Diagnosis:   1. Gait abnormality    2. Multiple sclerosis    3. Knee pain, acute, left            Referring Provider:Zabeti, Aram, MD    Insurance plan:   Payor/Plan Subscr DOB Sex Relation Sub. Ins. ID Effective Group Num   1. EXCHANGE - EXPAIDEN, CARAVEO 1956-07-16 Female  [id number] Not Eff HIX[employer]                                   [address]   2. MEDICAID OHIOCORYNN, SOLBERG [birth year] Female  [id number] Not Eff                                    [address]                     # of visits per insurance authorization:insurance not authorized prior to evaluation - will continue to monitor for visit approval.   # of visits per POC: 8V     Goals of Therapy:     Patient/Family Goals: By discharge, pt would like to: walk without pain.   Short Term/Long term Goals:   time frame 4 wks Initial Status 10/18/2014   patient will perform home exercise program based on therapy recommendations independently to maintain benefits gained in physical therapy. Issued quad strengthening program today since pt is leaving for a week on vacation and won't be return to PT until return.    patient will improve ITB flexibility / ROM to WNL in order to decrease lateral tracking of patella. Lateral tracking of L knee; + ober's LLE   Patient will improve L knee ROM to = R knee in order to restore normalized joint mobility Knee flexion: Left: 105 deg    Right: 120 deg  Knee extension:Left: lacking 5 deg   Right: 0 deg   Patient will improve L hip flexion and abduction strength for improved L hip stabilization.  4-/5 with pain to resistance testing   Patient will complete test with >956ft with LRAD without c/o pain for return to community ambulation distances.  Walking 30-25ft at a time before pain   Patient will report decreased pain to 0/10 on average for return to line dancing.  1-2/10 at present;    2-4/10 at worst since injection,   8/10 at worst prior to injection;    1-2/10 at best in past month  Location: L knee      _____________________________________________________________________  Medicare G-Code Primary Functional Limitation    [x]  Mobility [] Change Position []  Carry, Moving and Handling []  Self-care []  Other 1 []  Other 2   Current: []  CH 0% []  CI 1-19% [x]  CJ 20-39% []  CK 39-59% []  CL 60-79% []  CM 80-99% []  CN 100%   Goal: []  CH 0% [x]  CI 1-19% []  CJ 20-39% []  CK 39-59% []  CL 60-79% []  CM 80-99% []  CN 100%   D/C: []  CH 0% []  CI 1-19% []  CJ 20-39% []  CK 39-59% []  CL 60-79% []  CM 80-99% []  CN 100%    Reported on visit#: 1   Determining Criteria: limited walking tolerance and decreased ability to perform recreational activity.    Next G-Code Report due by Visit #: 10  ____________________________________________________________________  Pain:1-2/10 Location:L knee    Precautions: MS  Subjective: PT Eval completed - see for details.     Objective: PT treatment per log:    Activity Date: 10/18/14 Date:  Date: Date:    Visit # 1      g-code 1      Eval  completed      Knee bace education X 5 mins      HEP review and demo X 5 mins      Quad sets       SAQ       SLR       BRidge with add set       gastroc stretch              Hip abd sidelying              TKE standing       Mini squats avoid valgus force       Hee/toe raises              Patellar mobs to inc medial glide       ITBand stretch / MFR                     Total Treatment Time 40 mins      Timed Code Treatment Minutes 30 mins eval  10 mins TE        Education Addressed During This Session: PT POC and goals.    Assessment/Progress toward Goals: Patient arrives to PT eval with c/o Left knee pain with resport of MRI results of +meniscus tear presenting with decreased L hip strength, lateral tracking of patella, pain in M/L joint knee lines of L knee and decreased knee ROM contributing to functional limitations including antalgic gait pattern and decreased tolerance to ambulating and PLOF recreational activity such as line dancing. Patient would benefit from skilled  PT to address the aforementioned deficits.     Plan: review HEP for quad strengthening. Continue with improving tracking of L patella and VMO strength

## 2014-10-18 NOTE — Unmapped (Signed)
Raeanne Gathers, MD  Assistant Professor of Neurology   Director of [facility]   [address]   [place], [place] [address]????  [phone]

## 2014-10-18 NOTE — Unmapped (Signed)
Physical Therapy Initial Evaluation and Plan of Care  Name: Melanie [name]     Date of Birth: 1956-02-10      MRN: [id number]    Date of evaluation: 10/18/2014  Referring Physician: Raeanne Gathers, MD  [address]  [place], [place] [address] Phone: [phone] Fax: [phone]  Specific Order: MS and gait difficulty   Date of Onset: 3 weeks ago  Primary Medical Diagnosis:   1. Gait abnormality    2. Multiple sclerosis      Insurance: Payor: EXCHANGE / Plan: EXCHANGE CARESOURCE JUST 4 ME / Product Type: *No Product type* /     Subjective/History:   History of Current Problem/Reason for Referral: Melanie [name] is a 58 y.o. female who presents today with chief complaint of left knee pain. Tinking she had an exacerbation but had MRI (this week) for L knee and found to have +meniscus tear.  Had a cortisone shot and feeling a lot better but trying to stay off knee. Still having a little pain.  Leaving for vacation on Monday.      Mechanism of Injury: no specific mechanism of injury but notes she does line dancing and may have been from that.  MS diagnosed in 2004. Last exacerbation 3 weeks ago when knee pain also started.     PMH/PSH:   Past Medical History   Diagnosis Date   ??? Asthma    ??? Multiple sclerosis      Dx 2000   ??? Hypertension    ??? Diabetes mellitus    ,   Past Surgical History   Procedure Laterality Date   ??? Knee surgery     ??? Appendectomy     ??? Hysterectomy       not sure what second type surgery she had. Had 2 surgeries, maybe partial hys then went back and toook the cervix.     Medications: She has a current medication list which includes the following prescription(s): albuterol, baclofen, blood sugar diagnostic, gabapentin, ibuprofen, inhalational spacing device, interferon beta-1a (albumin), lisinopril-hydrochlorothiazide, metformin, and oxybutynin.  Work Status:Permanent Disability  Living Environment: house with 2 levels but able to stay on bottom level.     Level of assistance Available:  lives alone   Household Barriers: shower seat   Equipment at home: Using a cane for long distances, but able to walk without AD.    Prior Treatment:   Prior Level of Function: line dancing everyday of week except thursday, walking without cane  Current Level of Function: not walking as much, using SPC, no longer line dancing.   Precautions/Contraindications: heat sensitive     Objective:seated   Vital Signs: BP / PR    Other:    Mental Status: alert, appears stated age and cooperative  Vision: glasses    Pain:   Level (0-10): 1-2/10 at present;  2-4/10 at worst since injection, 8/10 at worst prior to injection;  1-2/10 at best in past month  Location: L knee  Quality: constant, dull and aching / sharp shooting when walking   Relieved By: aspircream and ice/elevation    Exacerbated By: walking   Additional Comments: has only walked 30-40 ft at a time before rest.     Sensation:  WFL     Posture:    LE   Strength: normal or tested as follows:  Hip flexion:  Left: 4-/5 Right: 4/5 (4+/5 SLR BLE)  Hip abduction:  Left: 4-/5 pain Right: 5/5  Hip extension:  Left: 4+/5 Right: 4+/5  Knee flexion:  Left: 4+/5 Right: 4+/5  Knee extension: Left: 5/5 Right: 5/5  Ankle dorsiflexion: Left: 5/5 Right: 5/5  Ankle plantarflexion: Left: 4+/5 Right: 5/5     LE Range of Motion: normal or tested as follows:   Hip ER:  Left: diminished range of motion  Right: full range of motion   Hip IR:   Left: diminished range of motion  Right: diminished range of motion    Hip extension:  Left: diminished range of motion  Right: diminished range of motion  Knee flexion:  Left: 105 deg     Right: 120 deg  Knee extension: Left: lacking 5 deg    Right: 0 deg  Ankle dorsiflexion: Left: full range of motion   Right: full range of motion    Modified Ashworth:0, No increase in tone  Location:      Bed Mobility: Independent   Flexibility: HS 90/90: lacking 30 deg R;L   Tight ITBand L>R, lateral tracking of patella, tender to M/L joint lines and with patella  mobs = inc gaurding    Standing:   Balance:   - Sitting WFL  - Standing NT  Coordination:  no dysmetria or tremor noted  Gait: Antalgic    Stairs:  Specialized Tests: n/a  Additional Assessment Information: discussed knee stabilizer soft brace for pt to purchase prior to leaving for her trip to [place] on Monday. Also issued and verbally and demo reviewed quad strengthening HEP.      Assessment:   Assessment/Problem List: Patient arrives to PT eval with c/o Left knee pain with report of MRI results of +meniscus tear presenting with decreased L hip strength, lateral tracking of patella, pain in M/L joint knee lines of L knee and decreased knee ROM contributing to functional limitations including antalgic gait pattern and decreased tolerance to ambulating and PLOF recreational activity such as line dancing. Patient would benefit from skilled PT to address the aforementioned deficits.   Rehabilitation Potential: Based on this therapist's assessment, ABI SHOULTS has the following rehabilitation potential for the PT goals stated below: : good  Plan Of Care:      Patient/Family Goals: By discharge, pt would like to: walk without pain.   Short Term/Long term Goals:   time frame 4 wks Initial Status 10/18/2014   patient will perform home exercise program based on therapy recommendations independently to maintain benefits gained in physical therapy. Issued quad strengthening program today since pt is leaving for a week on vacation and won't be return to PT until return.    patient will improve ITB flexibility / ROM to WNL in order to decrease lateral tracking of patella. Lateral tracking of L knee; + ober's LLE   Patient will improve L knee ROM to = R knee in order to restore normalized joint mobility Knee flexion: Left: 105 deg    Right: 120 deg  Knee extension:Left: lacking 5 deg   Right: 0 deg   Patient will improve L hip flexion and abduction strength for improved L hip stabilization.  4-/5 with pain to resistance  testing   Patient will complete test with >922ft with LRAD without c/o pain for return to community ambulation distances.  Walking 30-37ft at a time before pain   Patient will report decreased pain to 0/10 on average for return to line dancing.  1-2/10 at present;    2-4/10 at worst since injection,   8/10 at worst prior to injection;  1-2/10 at best in past month  Location: L knee     Plan:    Treatment to Include: Gait Training  Orthotic assessment  Home exercise program for quad strengthening  Therapeutic Exercise  Therapeutic Activities  Neuromuscular Re-education  Modalities  Manual Therapy     Patient/Family Agree to Treatment: Yes      Treatment Diagnosis (ICD-9 Code):   1. Gait abnormality    2. Multiple sclerosis    3. Knee pain, acute, left       Therapy Frequency/Duration: Patient to receive skilled PT services up to 2 times per week for up to 4 weeks or up to 8 visits starting from the first scheduled follow up visit within this plan of care.     Therapist Signature: Yevonne Aline, PT, DPT  Date: 10/18/2014    Physician Certification   I certify that the above patient is under my care an requires the above services. These professional services are to be provided from an established plan, related to the diagnosis and reviewed by me every 90 days.     Additional comments/revisions:     Physician Name: Raeanne Gathers, MD    Signature_______________________________________ Date: _______

## 2014-10-23 NOTE — Progress Notes (Signed)
Resulted/reviewed.

## 2014-10-29 ENCOUNTER — Encounter: Admit: 2014-10-29 | Discharge: 2014-10-29 | Payer: MEDICARE

## 2014-10-29 DIAGNOSIS — G35 Multiple sclerosis: Secondary | ICD-10-CM

## 2014-10-29 NOTE — Unmapped (Signed)
Diagnosis:   1. Gait abnormality    2. Multiple sclerosis    3. Knee pain, acute, left            Referring Provider:Zabeti, Aram, MD    Insurance plan:   Payor/Plan Subscr DOB Sex Relation Sub. Ins. ID Effective Group Num   1. EXCHANGE - Melanie [name] 02/03/1956 Female  [id number] Not Eff [employer]IX[employer]                                   [address]   2. MEDICAID [place], [name] [birth year] Female  [id number] Not Eff                                    [address]                     # of visits per insurance authorization:insurance not authorized prior to evaluation - will continue to monitor for visit approval.   # of visits per POC: 8V     Goals of Therapy:     Patient/Family Goals: By discharge, pt would like to: walk without pain.   Short Term/Long term Goals:   time frame 4 wks Initial Status 10/18/2014   patient will perform home exercise program based on therapy recommendations independently to maintain benefits gained in physical therapy. Issued quad strengthening program today since pt is leaving for a week on vacation and won't be return to PT until return.    patient will improve ITB flexibility / ROM to WNL in order to decrease lateral tracking of patella. Lateral tracking of L knee; + ober's LLE   Patient will improve L knee ROM to = R knee in order to restore normalized joint mobility Knee flexion: Left: 105 deg    Right: 120 deg  Knee extension:Left: lacking 5 deg   Right: 0 deg   Patient will improve L hip flexion and abduction strength for improved L hip stabilization.  4-/5 with pain to resistance testing   Patient will complete test with >942ft with LRAD without c/o pain for return to community ambulation distances.  Walking 30-37ft at a time before pain   Patient will report decreased pain to 0/10 on average for return to line dancing.  1-2/10 at present;    2-4/10 at worst since injection,   8/10 at worst prior to injection;    1-2/10 at best in past month  Location: L knee      _____________________________________________________________________  Medicare G-Code Primary Functional Limitation    [x]  Mobility [] Change Position []  Carry, Moving and Handling []  Self-care []  Other 1 []  Other 2   Current: []  CH 0% []  CI 1-19% [x]  CJ 20-39% []  CK 39-59% []  CL 60-79% []  CM 80-99% []  CN 100%   Goal: []  CH 0% [x]  CI 1-19% []  CJ 20-39% []  CK 39-59% []  CL 60-79% []  CM 80-99% []  CN 100%   D/C: []  CH 0% []  CI 1-19% []  CJ 20-39% []  CK 39-59% []  CL 60-79% []  CM 80-99% []  CN 100%    Reported on visit#: 1   Determining Criteria: limited walking tolerance and decreased ability to perform recreational activity.    Next G-Code Report due by Visit #: 10  ____________________________________________________________________  Pain:1-2/10 Location:L knee    Precautions: MS  Subjective: Pt reports her vacation went great. She bought recommended knee brace and used her cane as needed and they worked really well for her and walking on her trip. Did exercises a little bit.  Knee is feeling a little better overall.      Objective: PT treatment per log:    Activity Date: 10/18/14 Date: 10/29/14 Date: Date:    Visit # 1 2     g-code 1 2     Eval  completed      Knee bace education X 5 mins      HEP review and demo X 5 mins      Quad sets  H5 x 10     SAQ  H5 x 10 with add  Over foam roll     SLR  X 10 LLE     BRidge with add set  X 10 with cue to press through heels to reduce LBP     gastroc stretch       clamshell  X 10 LLE     Hip abd sidelying  X 10 LLE            TKE standing       Mini squats avoid valgus force  X 10     Hee/toe raises              Patellar mobs to inc medial glide  2 x 10       ITBand stretch / MFR  X 10 mins     Kinesiotape to inc L patellar medial glide and tilt  Completed             Total Treatment Time 40 mins 9:10 - 9:56  46 mins     Timed Code Treatment Minutes 30 mins eval  10 mins TE 12 mins manual  34 mins TE       Education Addressed During This Session:     Assessment/Progress  toward Goals:  Pt demonstrates significant tenderness and taughtness of L ITB which improved with manual technique. Pt noted improved pain level with taping but states her brace feels even better than taping alone.  Pt did fatigue with today's program and required tactile and VCs, minimal, for proper exercise technique and quad activation and will therefore continue to benefit from supervised quad strengthening.     Plan: continue with progressive quad strengthening. Continue with improving tracking of L patella and VMO strength

## 2014-10-30 ENCOUNTER — Encounter: Admit: 2014-10-30 | Discharge: 2014-10-30 | Payer: MEDICARE

## 2014-10-30 DIAGNOSIS — R269 Unspecified abnormalities of gait and mobility: Secondary | ICD-10-CM

## 2014-10-30 NOTE — Unmapped (Signed)
Diagnosis:   1. Gait abnormality    2. Multiple sclerosis    3. Knee pain, acute, left            Referring Provider:Zabeti, Aram, MD    Insurance plan:   Payor/Plan Subscr DOB Sex Relation Sub. Ins. ID Effective Group Num   1. EXCHANGE - EXSHATEKA, PETREA [birth year] Female  [id number] Not Eff [employer]IX[employer]                                   [address]   2. MEDICAID [place], CALMES [birth year] Female  [id number] Not Eff                                    [address]                     # of visits per insurance authorization:insurance not authorized prior to evaluation - will continue to monitor for visit approval.   # of visits per POC: 8V     Goals of Therapy:     Patient/Family Goals: By discharge, pt would like to: walk without pain.   Short Term/Long term Goals:   time frame 4 wks Initial Status 10/18/2014   patient will perform home exercise program based on therapy recommendations independently to maintain benefits gained in physical therapy. Issued quad strengthening program today since pt is leaving for a week on vacation and won't be return to PT until return.    patient will improve ITB flexibility / ROM to WNL in order to decrease lateral tracking of patella. Lateral tracking of L knee; + ober's LLE   Patient will improve L knee ROM to = R knee in order to restore normalized joint mobility Knee flexion: Left: 105 deg    Right: 120 deg  Knee extension:Left: lacking 5 deg   Right: 0 deg   Patient will improve L hip flexion and abduction strength for improved L hip stabilization.  4-/5 with pain to resistance testing   Patient will complete test with >953ft with LRAD without c/o pain for return to community ambulation distances.  Walking 30-12ft at a time before pain   Patient will report decreased pain to 0/10 on average for return to line dancing.  1-2/10 at present;    2-4/10 at worst since injection,   8/10 at worst prior to injection;    1-2/10 at best in past month  Location: L knee    _____________________________________________________________________  Medicare G-Code Primary Functional Limitation    [x]  Mobility [] Change Position []  Carry, Moving and Handling []  Self-care []  Other 1 []  Other 2   Current: []  CH 0% []  CI 1-19% [x]  CJ 20-39% []  CK 39-59% []  CL 60-79% []  CM 80-99% []  CN 100%   Goal: []  CH 0% [x]  CI 1-19% []  CJ 20-39% []  CK 39-59% []  CL 60-79% []  CM 80-99% []  CN 100%   D/C: []  CH 0% []  CI 1-19% []  CJ 20-39% []  CK 39-59% []  CL 60-79% []  CM 80-99% []  CN 100%    Reported on visit#: 1   Determining Criteria: limited walking tolerance and decreased ability to perform recreational activity.    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 States taping helped but worse tape with brace yesterday. Went back to line dancing modifying her activity as needed.     Objective: PT treatment per log:    Activity Date: 10/18/14 Date: 10/29/14 Date: 10/30/14 Date:    Visit # 1 2 3     g-code 1 2 3     Eval  completed      Knee bace education X 5 mins      HEP review and demo X 5 mins      Nu-step   L3 x 5 mins    Quad sets  H5 x 10 H5 x 10 B    SAQ  H5 x 10 with add  Over foam roll H5 x 10 B with add; Over foam roll    SLR  X 10 LLE X 10 R/L    BRidge with add set  X 10 with cue to press through heels to reduce LBP X 10    gastroc stretch   Incline H30 x 2 R/L (single leg due to shoes slipping on board)    clamshell  X 10 LLE X 10 LLE    Hip abd sidelying  X 10 LLE X 10 LLE           TKE standing       Mini squats avoid valgus force  X 10     Hee/toe raises              Patellar mobs to inc medial glide  2 x 10       ITBand stretch / MFR  X 10 mins X 10 mins    Kinesiotape to inc L patellar medial glide and tilt  Completed             Total Treatment Time 40 mins 9:10 -  9:56  46 mins 7:46 - 8:15  (late arrival)    Timed Code Treatment Minutes 30 mins eval  10 mins TE 12 mins manual  34 mins TE 29 mins TE      Education Addressed During This Session:     Assessment/Progress toward Goals: pt demonstrates improved ITBand mobility with decreased palpable bands in soft tissue and decreased reports of tenderness to MFR.  Pt performed all there ex today without c/o pain and no c/o fatigue. Pt will benefit from progression of POC to closed chain strengthening next visit, limited session today due to late arrival.     Plan: continue with progressive quad strengthening - Add closed chain strengthening. Continue with improving tracking of L patella and VMO strength

## 2014-11-07 ENCOUNTER — Encounter: Admit: 2014-11-07 | Discharge: 2014-11-07 | Payer: MEDICARE

## 2014-11-07 DIAGNOSIS — R269 Unspecified abnormalities of gait and mobility: Secondary | ICD-10-CM

## 2014-11-07 NOTE — Unmapped (Signed)
Diagnosis:   1. Gait abnormality     2. Multiple sclerosis     3. Knee pain, acute, left             Referring Provider:Zabeti, Aram, MD    Insurance plan:   Payor/Plan Subscr DOB Sex Relation Sub. Ins. ID Effective Group Num   1. EXCHANGE - EXNATHALIE, CAVENDISH November 02, 1956 Female  [id number] Not Eff [employer]IXOH                                   [address]   2. MEDICAID [place]OVIOLETTE, MORNEAULT 03-May-1956 Female  [id number] Not Eff                                    [address]                     # of visits per insurance authorization:insurance not authorized prior to evaluation - will continue to monitor for visit approval.   # of visits per POC: 8V     Goals of Therapy:     Patient/Family Goals: By discharge, pt would like to: walk without pain.   Short Term/Long term Goals:   time frame 4 wks Initial Status 10/18/2014   patient will perform home exercise program based on therapy recommendations independently to maintain benefits gained in physical therapy. Issued quad strengthening program today since pt is leaving for a week on vacation and won't be return to PT until return.    patient will improve ITB flexibility / ROM to WNL in order to decrease lateral tracking of patella. Lateral tracking of L knee; + ober's LLE   Patient will improve L knee ROM to = R knee in order to restore normalized joint mobility Knee flexion: Left: 105 deg    Right: 120 deg  Knee extension:Left: lacking 5 deg   Right: 0 deg   Patient will improve L hip flexion and abduction strength for improved L hip stabilization.  4-/5 with pain to resistance testing   Patient will complete test with >973ft with LRAD without c/o pain for return to community ambulation distances.  Walking 30-34ft at a time before pain   Patient will report decreased pain to 0/10 on average for return to line dancing.  1-2/10 at present;    2-4/10 at worst since injection,   8/10 at worst prior to injection;    1-2/10 at best in past month  Location: L knee    _____________________________________________________________________  Medicare G-Code Primary Functional Limitation    [x]  Mobility [] Change Position []  Carry, Moving and Handling []  Self-care []  Other 1 []  Other 2   Current: []  CH 0% []  CI 1-19% [x]  CJ 20-39% []  CK 39-59% []  CL 60-79% []  CM 80-99% []  CN 100%   Goal: []  CH 0% [x]  CI 1-19% []  CJ 20-39% []  CK 39-59% []  CL 60-79% []  CM 80-99% []  CN 100%   D/C: []  CH 0% []  CI 1-19% []  CJ 20-39% []  CK 39-59% []  CL 60-79% []  CM 80-99% []  CN 100%    Reported on visit#: 1   Determining Criteria: limited walking tolerance and decreased ability to perform recreational activity.    Next G-Code Report due by Visit #: 10  ____________________________________________________________________  Pain: 3/10 Location:L knee    Precautions: MS  Subjective: I feel like my knee is taking forever to heal.  Not wearing knee brace today.     Objective: PT treatment per log:    Activity Date: 10/18/14 Date: 10/29/14 Date: 10/30/14 Date: 11/07/14    Visit # 1 2 3 4    g-code 1 2 3 4    Eval  completed      Knee bace education X 5 mins      HEP review and demo X 5 mins      Nu-step   L3 x 5 mins L3 x 6 mins   Quad sets  H5 x 10 H5 x 10 B H5 x 15 L   SAQ  H5 x 10 with add  Over foam roll H5 x 10 B with add; Over foam roll    SLR  X 10 LLE X 10 R/L X 15 L  With ER for VMO x 10 L   BRidge with add set  X 10 with cue to press through heels to reduce LBP X 10 X 15   gastroc stretch   Incline H30 x 2 R/L (single leg due to shoes slipping on board)    clamshell  X 10 LLE X 10 LLE    Hip abd sidelying  X 10 LLE X 10 LLE           TKE standing    7.5# 2x10   Mini squats avoid valgus force  X 10  2x10   Hee/toe raises    2x10          Patellar mobs to inc medial glide  2 x 10    X 2 mins   ITBand stretch / MFR  X 10 mins X 10 mins X 8 mins   Kinesiotape to inc L patellar medial glide and tilt  Completed   completed          Total Treatment Time 40 mins 9:10 - 9:56  46 mins 7:46 - 8:15  (late  arrival) 8:20 - 9:02     Timed Code Treatment Minutes 30 mins eval  10 mins TE 12 mins manual  34 mins TE 29 mins TE 42 mins TE     Education Addressed During This Session: continue HEP    Assessment/Progress toward Goals: pt progressed to closed chain exercise without increase in pain level. Cueing needed for proper squat technique. Added VMO SLR with good control. Minimal VCs needed for mat exercise for quad activation    Plan: continue with progressive quad strengthening and improving tracking of L patella and VMO strength

## 2014-11-08 ENCOUNTER — Encounter

## 2014-11-15 ENCOUNTER — Ambulatory Visit: Admit: 2014-11-15

## 2014-11-19 ENCOUNTER — Encounter: Admit: 2014-11-19 | Discharge: 2014-11-19 | Payer: MEDICARE

## 2014-11-19 DIAGNOSIS — R269 Unspecified abnormalities of gait and mobility: Secondary | ICD-10-CM

## 2014-11-19 NOTE — Unmapped (Signed)
Diagnosis:   1. Gait abnormality     2. Multiple sclerosis     3. Knee pain, acute, left             Referring Provider:Zabeti, Aram, MD    Insurance plan:   Payor/Plan Subscr DOB Sex Relation Sub. Ins. ID Effective Group Num   1. EXCHANGE - EXSABRINE, PATCHEN 02-03-1956 Female  [id number] Not Eff [employer]IXOH                                   [address]   2. MEDICAID [place], FLITTON 1956/02/15 Female  [id number] Not Eff                                    [address]                     # of visits per insurance authorization:insurance not authorized prior to evaluation - will continue to monitor for visit approval.   # of visits per POC: 8V     Goals of Therapy:     Patient/Family Goals: By discharge, pt would like to: walk without pain.   Short Term/Long term Goals:   time frame 4 wks Initial Status 10/18/2014 11/19/14   patient will perform home exercise program based on therapy recommendations independently to maintain benefits gained in physical therapy. Issued quad strengthening program today since pt is leaving for a week on vacation and won't be return to PT until return.  GOAL MET  Pt is performing compliantly, has knee brace that is helping   patient will improve ITB flexibility / ROM to WNL in order to decrease lateral tracking of patella. Lateral tracking of L knee; + ober's LLE GOAL NOT MET / PROGRESSING  Patient continues with lateral tracking of patella, brace is helping decrease lateral pull. Improving ITBand flexibility, negative Ober's.    Patient will improve L knee ROM to = R knee in order to restore normalized joint mobility Knee flexion: Left: 105 deg    Right: 120 deg  Knee extension:Left: lacking 5 deg   Right: 0 deg PROGRESSING  Knee flexion: Left: 108 deg   Knee extension:Left: lacking 2 deg                       Patient will improve L hip flexion and abduction strength for improved L hip stabilization.  4-/5 with pain to resistance testing GOAL MET  L hip flexion = 5/5  L hip abduction =  4+/5   Patient will complete test with >927ft with LRAD without c/o pain for return to community ambulation distances.  Walking 30-73ft at a time before pain GOAL MET  test = 1,099 ft    Patient will report decreased pain to 0/10 on average for return to line dancing.  1-2/10 at present;    2-4/10 at worst since injection,   8/10 at worst prior to injection;    1-2/10 at best in past month  Location: L knee GOAL MET  0/10 pain on average  2/10 at worst  Pt has returned to line dancing, self monitoring her pain and taking breaks as needed   _____________________________________________________________________  Medicare G-Code Primary Functional Limitation    [x]  Mobility [] Change Position []  Carry, Moving and Handling []   Self-care []  Other 1 []  Other 2   Current: []  CH 0% []  CI 1-19% [x]  CJ 20-39% []  CK 39-59% []  CL 60-79% []  CM 80-99% []  CN 100%   Goal: []  CH 0% [x]  CI 1-19% []  CJ 20-39% []  CK 39-59% []  CL 60-79% []  CM 80-99% []  CN 100%   D/C: []  CH 0% []  CI 1-19% []  CJ 20-39% []  CK 39-59% []  CL 60-79% []  CM 80-99% []  CN 100%    Reported on visit#: 1   Determining Criteria: limited walking tolerance and decreased ability to perform recreational activity.    Next G-Code Report due by Visit #: 10  ____________________________________________________________________  Pain: 1-2/10 Location:L knee    Precautions: MS    Subjective: pt reports feeling 60% improved.  Steps are still difficult and painful, goes non-reciprocal.  Has returned to line dancing. Taping helped a lot.      Objective: PT treatment per log:    Activity Date: 10/18/14 Date: 10/29/14 Date: 10/30/14 Date: 11/07/14 11/19/14    Visit # 1 2 3 4 5    g-code 1 2 3 4 5    Eval  completed    Goals re-assessed - see above.    Knee bace education X 5 mins    continue wear   HEP review and demo X 5 mins       Nu-step   L3 x 5 mins L3 x 6 mins L3 x 6 mins   Quad sets  H5 x 10 H5 x 10 B H5 x 15 L    SAQ  H5 x 10 with add  Over foam roll H5 x 10 B with add;  Over foam roll     SLR  X 10 LLE X 10 R/L X 15 L  With ER for VMO x 10 L    BRidge with add set  X 10 with cue to press through heels to reduce LBP X 10 X 15    gastroc stretch   Incline H30 x 2 R/L (single leg due to shoes slipping on board)     clamshell  X 10 LLE X 10 LLE     Hip abd sidelying  X 10 LLE X 10 LLE             Step ups     Stairs: nonrecpircal with BHR  4 step ups x 15 L  4 lateral eccentric lowers with BHR with poor eccentric control L quad           TKE standing    7.5# 2x10    Mini squats avoid valgus force  X 10  2x10    Hee/toe raises    2x10            Patellar mobs to inc medial glide  2 x 10    X 2 mins    ITBand stretch / MFR  X 10 mins X 10 mins X 8 mins    Kinesiotape to inc L patellar medial glide and tilt  Completed   completed completed           Total Treatment Time 40 mins 9:10 - 9:56  46 mins 7:46 - 8:15  (late arrival) 8:20 - 9:02   43 mins   Timed Code Treatment Minutes 30 mins eval  10 mins TE 12 mins manual  34 mins TE 29 mins TE 42 mins TE 43 mins TE     Education Addressed During This Session: continue HEP - add  step ups to 2-4 step,     Assessment/Progress toward Goals: progressing L knee strength, ROM. Limited on stairs - assessed and pt demonstrates decreased eccentric control     Plan: reassess eccentric quad control and steps

## 2014-11-26 ENCOUNTER — Encounter: Admit: 2014-11-26 | Discharge: 2014-11-26 | Payer: MEDICARE

## 2014-11-26 DIAGNOSIS — G35 Multiple sclerosis: Secondary | ICD-10-CM

## 2014-11-26 NOTE — Unmapped (Signed)
P.T. DISCHARGE SUMMARY  TIKISHA MOLINARO  1956/12/19  [id number]     Referring Provider: Raeanne Gathers, MD  [address]  [address]  [place], [place] [address] Phone: [phone] Fax: [phone]    Diagnosis:   1. Gait abnormality     2. Multiple sclerosis     3. Knee pain, acute, left           Goals of Therapy:     Patient/Family Goals: By discharge, pt would like to: walk without pain.   Short Term/Long term Goals:   time frame 4 wks Initial Status 10/18/2014 11/19/14   patient will perform home exercise program based on therapy recommendations independently to maintain benefits gained in physical therapy. Issued quad strengthening program today since pt is leaving for a week on vacation and won't be return to PT until return.  GOAL MET  Pt is performing compliantly, has knee brace that is helping   patient will improve ITB flexibility / ROM to WNL in order to decrease lateral tracking of patella. Lateral tracking of L knee; + ober's LLE GOAL NOT MET / PROGRESSING  Patient continues with lateral tracking of patella, brace is helping decrease lateral pull. Improving ITBand flexibility, negative Ober's.    Patient will improve L knee ROM to = R knee in order to restore normalized joint mobility Knee flexion: Left: 105 deg    Right: 120 deg  Knee extension:Left: lacking 5 deg   Right: 0 deg GOAL NOT MET / PROGRESSING  Knee flexion: Left: 108 deg   Knee extension:Left: lacking 2 deg                       Patient will improve L hip flexion and abduction strength for improved L hip stabilization.  4-/5 with pain to resistance testing GOAL MET  L hip flexion = 5/5  L hip abduction = 4+/5   Patient will complete test with >9106ft with LRAD without c/o pain for return to community ambulation distances.  Walking 30-45ft at a time before pain GOAL MET  test = 1,099 ft    Patient will report decreased pain to 0/10 on average for return to line dancing.  1-2/10 at present;    2-4/10 at worst since injection,   8/10  at worst prior to injection;    1-2/10 at best in past month  Location: L knee GOAL MET  0/10 pain on average  2/10 at worst  Pt has returned to line dancing, self monitoring her pain and taking breaks as needed   _____________________________________________________________________  Medicare G-Code Primary Functional Limitation    [x]  Mobility [] Change Position []  Carry, Moving and Handling []  Self-care []  Other 1 []  Other 2   Current: []  CH 0% []  CI 1-19% [x]  CJ 20-39% []  CK 39-59% []  CL 60-79% []  CM 80-99% []  CN 100%   Goal: []  CH 0% [x]  CI 1-19% []  CJ 20-39% []  CK 39-59% []  CL 60-79% []  CM 80-99% []  CN 100%   D/C: []  CH 0% [x]  CI 1-19% []  CJ 20-39% []  CK 39-59% []  CL 60-79% []  CM 80-99% []  CN 100%    Reported on visit#: 6   Determining Criteria: progressing fully toward goals.   ____________________________________________________________________  Pain: 1-2/10 Location:L knee    Precautions: MS    Subjective:  Did step ups and was able to go up and down steps alternating legs.  JOINED [employer] AND GOING TO START USING POOL.  Objective: PT treatment per log:    Activity Date: 10/18/14 Date: 10/29/14 Date: 10/30/14 Date: 11/07/14 11/19/14 11/26/14    Visit # 1 2 3 4 5 6    g-code 1 2 3 4 5 6   g-code   Eval  completed    Goals re-assessed - see above.     Knee bace education X 5 mins    continue wear    HEP review and demo X 5 mins     Completed as follows:   Nu-step   L3 x 5 mins L3 x 6 mins L3 x 6 mins    Quad sets  H5 x 10 H5 x 10 B H5 x 15 L  H5 x 10 L   SAQ  H5 x 10 with add  Over foam roll H5 x 10 B with add; Over foam roll      SLR  X 10 LLE X 10 R/L X 15 L  With ER for VMO x 10 L  X 10 L   BRidge with add set  X 10 with cue to press through heels to reduce LBP X 10 X 15  X 10 B   gastroc stretch   Incline H30 x 2 R/L (single leg due to shoes slipping on board)   reviewed   clamshell  X 10 LLE X 10 LLE   X 10 LLE   Hip abd sidelying  X 10 LLE X 10 LLE   X 10 LLE            Step ups     Stairs: nonrecpircal  with BHR  4 step ups x 15 L  4 lateral eccentric lowers with BHR with poor eccentric control L quad Stairs: reciprocal with BHR  Slight compensation on LLE but much improved from last session.             TKE standing    7.5# 2x10     Mini squats avoid valgus force  X 10  2x10     Hee/toe raises    2x10              Patellar mobs to inc medial glide  2 x 10    X 2 mins     ITBand stretch / MFR  X 10 mins X 10 mins X 8 mins     Kinesiotape to inc L patellar medial glide and tilt  Completed   completed completed Completed and reviewed for self performance at home            Total Treatment Time 40 mins 9:10 - 9:56  46 mins 7:46 - 8:15  (late arrival) 8:20 - 9:02   43 mins 38 mins   Timed Code Treatment Minutes 30 mins eval  10 mins TE 12 mins manual  34 mins TE 29 mins TE 42 mins TE 43 mins TE 38 mins TE     Education Addressed During This Session: continue HEP and educated pt on self Kinesio-taping for lateral patellar glide and tilt.      Assessment/Progress toward Goals: SHAWNICE TILMON has completed their course of physical therapy consisting of treatment for L knee pain / meniscus tear and demonstrates good progress toward goals. Pt has significantly decreased pain level, returned to community ambulation distances, returned to line dancing recreational activity, and reciprocal stair climbing.  Pt continues with decreased knee ROM compared to RLE but this is improving as well as continues with tight ITBand and lateral patellar  tracking, but pt was educated on self kinesiotaping method for improving patellar tracking and increasing knee stability and pt reports no pain with technique.   At this time, recommend discharge from skilled PT due to maximal therapeutic benefit achieved. Recommend pt continue to work on [employer] for quad strengthening and flexibility. Pt has recently joined [facility] for performing aquatic exercise.      Plan: d/c to HEP.

## 2014-11-27 ENCOUNTER — Encounter: Attending: Orthopaedic Surgery

## 2014-12-05 ENCOUNTER — Encounter: Attending: Orthopaedic Surgery

## 2014-12-09 ENCOUNTER — Ambulatory Visit: Admit: 2014-12-09 | Discharge: 2014-12-09 | Payer: MEDICARE | Attending: Orthopaedic Surgery

## 2014-12-09 DIAGNOSIS — M1712 Unilateral primary osteoarthritis, left knee: Secondary | ICD-10-CM

## 2014-12-09 NOTE — Progress Notes (Signed)
Danielle Miles  [id number]  December 09, 2014    Chief Complaint   Patient presents with   ??? Follow-up     Left knee pain.        History: The patient is here in follow-up regarding her left knee.  Her left knee pain has improved significantly.  The injection back in October provided significant relief.  She was able to travel to [place].  He has been dissipating in therapy.  She has been taking ibuprofen.    The patient's  past medical history, medications, allergies,  family history, social history, and review of systems have been reviewed, and dated and are recorded in the chart.    Vitals:  BP 130/78 mmHg   Pulse 101   Ht 5' 4" (1.626 m)   Wt 226 lb (102.513 kg)   BMI 38.77 kg/m2   Breastfeeding? No    Physical: Danielle Miles appears well, she is in no apparent distress, she demonstrates appropriate mood & affect. She is alert and oriented to person, place and time. Examination of the left lower extremity reveals no pain with range of motion of the hip. She has generalized tenderness to palpation about the joint line of the left knee. Range of motion is from 0 degrees to 120 degrees. Strength is 4+ to 5/5 for all muscle groups about the left knee. There is patellofemoral crepitus with range of motion of the left knee. Varus and valgus stressing of the knees reveals no evidence of instability. There is a small effusion in the left knee. Anterior drawer and Lachman are negative bilaterally.   Examination of the skin reveals no rashes, ulceration, or lesion, bilaterally in the lower extremities. Sensation to both lower extremities is grossly intact. Exam of both feet reveals pedal pulses intact and brisk cap refill. Patient is able to dorsiflex and wiggle all toes. Deep tendon reflexes of the lower extremities are normal and symmetric.       Impression: 1 left knee degenerative medial meniscal tear 2) left knee osteoarthritis    Plan: She was instructed on activity modification and low impact exercising: Natural  history and expected course discussed. Questions answered.  Rest, ice, compression, and elevation (RICE) therapy.  Reduction in offending activity.  OTC analgesics as needed. If the patient continues to have pain, we will consider a repeat cortisone injection.

## 2014-12-25 NOTE — Communication Body (Signed)
[name]  [id number]  December 09, 2014    Chief Complaint   Patient presents with   ??? Follow-up     Left knee pain.        History: The patient is here in follow-up regarding her left knee.  Her left knee pain has improved significantly.  The injection back in October provided significant relief.  [place] able [place]  He has been dissipating in therapy.  She has been taking ibuprofen.    The patient's  past medical history, medications, allergies,  family history, social history, and review of systems have been reviewed, and dated and are recorded in the chart.    Vitals:  BP 130/78 mmHg   Pulse 101   Ht 5\' 4"  (1.626 m)   Wt 226 lb (102.513 kg)   BMI 38.77 kg/m2   Breastfeeding? No    Physical: Danielle Miles appears well, she is in no apparent distress, she demonstrates appropriate mood & affect. She is alert and oriented to person, place and time. Examination of the left lower extremity reveals no pain with range of motion of the hip. She has generalized tenderness to palpation about the joint line of the left knee. Range of motion is from 0 degrees to 120 degrees. Strength is 4+ to 5/5 for all muscle groups about the left knee. There is patellofemoral crepitus with range of motion of the left knee. Varus and valgus stressing of the knees reveals no evidence of instability. There is a small effusion in the left knee. Anterior drawer and Lachman are negative bilaterally.   Examination of the skin reveals no rashes, ulceration, or lesion, bilaterally in the lower extremities. Sensation to both lower extremities is grossly intact. Exam of both feet reveals pedal pulses intact and brisk cap refill. Patient is able to dorsiflex and wiggle all toes. Deep tendon reflexes of the lower extremities are normal and symmetric.       Impression: 1 left knee degenerative medial meniscal tear 2) left knee osteoarthritis    Plan: She was instructed on activity modification and low impact exercising: Natural  history and expected course discussed. Questions answered.  Rest, ice, compression, and elevation (RICE) therapy.  Reduction in offending activity.  OTC analgesics as needed. If the patient continues to have pain, we will consider a repeat cortisone injection.

## 2015-01-01 ENCOUNTER — Encounter: Attending: Family Medicine

## 2015-01-03 MED ORDER — baclofen (LIORESAL) 10 MG tablet
10 | ORAL_TABLET | ORAL | Status: AC
Start: 2015-01-03 — End: 2018-01-11

## 2015-01-03 MED ORDER — gabapentin (NEURONTIN) 300 MG capsule
300 | ORAL_CAPSULE | Freq: Three times a day (TID) | ORAL | Status: AC
Start: 2015-01-03 — End: 2016-03-04

## 2015-01-03 NOTE — Unmapped (Signed)
Pt called to request a refill script of Gabapentin 300mg , 1 tab, 3x/day sent to pharmacy on file

## 2015-01-07 ENCOUNTER — Ambulatory Visit: Payer: MEDICARE

## 2015-01-08 ENCOUNTER — Ambulatory Visit: Admit: 2015-01-08 | Payer: MEDICARE | Attending: Neurology

## 2015-01-08 DIAGNOSIS — G35 Multiple sclerosis: Secondary | ICD-10-CM

## 2015-01-08 NOTE — Unmapped (Addendum)
Vitamin D deficiency has been shown to be associated with Multiple Sclerosis disease activity. On 01/16/2014 blood work your vitamin D level was 59.6 but since you stopped the supplement VItmain D all together I am concern it might have dropped down now so I would like to ask your family doctor to recheck the level and monitor it . You need at least 2000 IU daily but your family doctor might increase it base on the result of blood work. The goal for Vitamin D level in MS patients is 50-70 ng/ml.

## 2015-01-08 NOTE — Unmapped (Signed)
Date:  01/08/2015  Name: JIMEKA BALAN  Age:  59 y.o.      Date Symptom   Onset/Outcome Brain MRI Spine MRI Tests DMT period A/E Meds   2000 Fatigue, cognitive G/P        2004 Blurry vision B/L G/I  Dx MS    Rebif IVMP   2013  Stable 04-now  PV -ve      03/2014  09/2014 S 3/15 R leg W after cold    4/15 S       12/2014                  Onset: Acute/Gradual , Outcome: Resolved/Improved/Progressed, MRI: Peri Ventricular/Juxta Cortical,      HPI:      01/08/15:  She thinks overall is stable, denies any new symptom or exacerbation. She received a steroid shot for knee pain which helped.        10/01/14:  I had the pleasure of evaluating Ms Kempen who is here for evaluation of new symptoms.  She is on Rebif since 2004 and take is regulalry with no missed doses.  She developed new pain in her left knee and left leg, She also noticed increased numbness in that extremity which lasted about 3 days and it has started to improve.    04/09/14 Dr. Genelle Gather  On 4/2, she woke up and couldn't walk, felt her R leg was weak. Also lost sensation with tingling. She waited to see if this would get better. She called on 4/6 and Dr. Genelle Gather thought she is probably having exacerbation. Labs were done with CBC, BMP, UA which did not show evidence of infection. Nurse visited her at home to get IV access. IV solumedrol was started on 4/12. She got the second dose today. Since about Friday last week, sensation was coming back. Weakness is improved but not back to baseline. She is able to walk. Dancing is still hard. She is still on Rebif, doing ok with the injection. This is a first real exacerbation since 2004.   Recent exacerbation happened after recent URI.    02/27/13  She was working as OR Haematologist at [employer]. She was feeling very tired with some cognitive and memory problem around 2000. Thne she had an episode of visual change in both eye in 2004 with partial recovery. She was Dx with MS based on MRI. She was started on Rebif . She thinks  it works and tolerating it well however does not like injection. Her last MRI was from April 2013.   Last year she lost 25 Lbs by diet and exercise after she was diagnosed with Diabetes but she regain that weight. She is planning to go back in diet this week.     I reviewed Dr. 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 Pt is currently on Rebif and is tolerating it well. She has not had an MRI since January 2012. Since pts last visit she feels her condition is getting worse. She is experiencing cognitive issues including concentration and memory issues. She has also started to urinate uncontrollably and has missed work as a result. Pt is having trouble walking due to pain in both knees and L hip pain. She also complains of stress at work which causes her to be depressed.   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 Pt is currently on Rebif and is tolerating it well. She has not had an MRI since January 2012. Since pts last visit she feels her condition is getting worse. She is experiencing cognitive issues including concentration and memory issues. She has also started to urinate uncontrollably and has missed work as a result. Pt is having trouble walking due to pain in both knees and L hip pain. She also complains of stress at work which causes her to be depressed.   Due to pts complaints of cognitive issues, we will refer her for a neuropsych eval. We will also give her the contact number to the social worker for the [facility]. I would like to obtain and MRI of the brain and c spine.      Review of System:      Pain:    R. Knee and hip, Steroid shot and Gabapentin   Sensory Numbness r. foot  Fatigue: bothersome  Sleep:       4 to 5 hrs uninterrupted,   Sleep Apnea: No longer uses CPAP    STM   Main symptom she get disability   Urinary Incontinece Oxybutinin helps, she has done  pelvic exercise, this was one the reason she stopped working   Walking Difficulty Tired and back pain, can do more after exercise   Imbalance  Trip over but no fall   Fatigue                        Exhausted    Life Style:      Living Situation:  Diet:    Exercise: Dance as exercise 4 days a week   Vitamin D: Did Rx 2015    Medications:      Current Outpatient Prescriptions   Medication Sig   ??? albuterol Inhale 2 puffs into the lungs. Every 4 to 6 hours as needed.    ??? baclofen TAKE ONE TABLET BY MOUTH THREE TIMES A DAY   ??? blood sugar diagnostic Test blood sugars up to twice a day   ??? gabapentin Take 1 capsule (300 mg total) by mouth 3 times a day. Indications: Neuropathic Pain   ??? inhalational spacing device by Misc.(Non-Drug; Combo Route) route. AEROCHAMBER PLUS.  Use with MDI    ??? interferon beta-1a (albumin) Inject 0.5 mLs (44 mcg total) subcutaneously every Monday, Wednesday, and Friday.   ??? lisinopril-hydrochlorothiazide Take 1 tablet by mouth daily.   ??? metFORMIN Take 1 tablet (500 mg total) by mouth 2 times a day with meals.   ??? oxybutynin Take two tablets by mouth daily     No current facility-administered medications for this visit.      Allergies: Compazine and Prochlorperazine    History:      PMHx:  has a past medical history of Asthma; Multiple sclerosis; Hypertension; and Diabetes mellitus.     PSHx:  has past surgical history that includes Knee surgery; Appendectomy; and Hysterectomy.    Social Hx: reports that she has never smoked. She has never used smokeless tobacco. She reports that she drinks alcohol. She reports that she does not use illicit drugs. She lives by herself. She feels she is safe but has to take extra time. Feels depressed being lonely. Grandkids lives cross the street and come and visit her. She goes to [facility] [employer], do dance club and try to stay active.     ZOX:WRUEAV history includes Alcohol abuse in her father  and other; Brain cancer in her other; COPD in her father; Cancer in her  father, maternal grandmother, mother, and sister; Diabetes in her daughter, paternal aunt, paternal grandfather, and paternal uncle; Skin cancer in her other; Stomach cancer in her other; Throat cancer in her other.    Physical Exam:    BP 136/64 mmHg   Pulse 84   Ht 5' 4 (1.626 m)   Wt 226 lb (102.513 kg)   BMI 38.77 kg/m2  Here is my exam from previous visits. I repeated some of these exams and updated the changes.   Mental Status:Patient registered 3 objects without difficulty and recalled 3/3 after five minutes.  The patient was fully oriented and had a normal fund of knowledge.  Cranial Nerves: Color Vision12/12 OD and 10/12 OS, best corrected visual acuity was 20/20 OD and 20/20 OS.  There was no afferent pupillary deficit.  Funduscopic exam revealed normal optic nerve heads.   Horizontal and vertical eye movements were full.  There was no nystagmus.  Facial sensation was intact to pinprick.  There was no facial weakness.  Hearing was intact to finger rubbing bilaterally. Palate moved fully and symmetrically.  Tongue protruded in the midline.  There was no dysarthria.  Motor:   Strength:R-L (MRC Scale)   Deltoid  5 - 5   Hip Fx   5 - 5  Biceps   5 - 5   Knee Ex  5 - 5   Triceps  5 - 5   Knee Fx  5 - 5   Wrist Ex  5 - 5   Plantar Fx 5 - 5   Grip         5 - 5   Ankle dorsiflex 5 - 5  Tone was normal  Reflexes: R / L (MRC Scale)   Biceps  2 / 2   Quadriceps 2 / 2   Triceps  2 / 2   Achilles  2 / 2   Brachoradialis 2 / 2   Toes   Up / down   Coordination: There was no ataxia on finger to nose or heel to shin testing.  Sensation: Decreased vibration BLE.     Position and pinprick sensation were intact in all four extremities.  Gait and station:  Unable to do tandem walk and hopping on either foot.   T25-FW date Time assistance   02/26/13  6 No   04/09/14 7.5 No   10/01/14 8.26 No  01/08/14 5.4 No     Tests:      04/08/14 COMPARISON: MRI dated 03/27/2012.  MRI head:   1. No significant interval change in the moderate  burden of T2 signal prolongation involving the periventricular, deep, and central pontine white matter consistent with the clinical diagnosis of multiple sclerosis. No new lesions, restricted diffusion, or enhancement is identified.   2. Small amount of layering fluid signal in the right maxillary sinus.       MRI cervical spine:   1. No evidence of cord signal abnormalities or enhancement.   2. Very mild multilevel degenerative discogenic changes without evidence of central canal or neuroforaminal stenosis, unchanged.       MRI thoracic spine:   1. No evidence of cord signal abnormalities or enhancement.   2. Very mild degenerative discogenic changes without evidence of central canal or neuroforaminal stenosis, involving the T3-T4 through T7-T8 levels.      Lab Results   Component Value Date    WBC 10.1  10/01/2014    HGB 14.9 10/01/2014    HCT 44.8 10/01/2014    MCV 91.2 10/01/2014    PLT 243 10/01/2014     Lab Results   Component Value Date    CREATININE 0.62 10/01/2014    BUN 12 10/01/2014    NA 137 10/01/2014    K 3.7 10/01/2014    CL 97* 10/01/2014    CO2 33 10/01/2014     Lab Results   Component Value Date    ALT 57* 10/01/2014    AST 60* 04/02/2014    ALKPHOS 60 10/01/2014    BILITOT 0.4 10/01/2014     Lab Results   Component Value Date    VITD25H 25.9* 02/27/2013     Lab Results   Component Value Date    VITAMINB12 322 10/01/2014     Lab Results   Component Value Date    TSH 1.42 10/01/2014     07/16/14 Vit D 25 59.5    Assessment/Plan:       59 y.o. female with Multiple Sclerosis. MS is not active and there is no clear evidence of disease progression while on Rebif.     1. Multiple sclerosis  - Continue Rebif  - blood work will be monitor by     2. Vitamin D deficiency  - Start Vitamin D 2000 Iu daily, will ask PCP to check the level and adjust the dose accordingly (Vit D25OH goal>50)    3. Gait difficulty  - PT    Medical Decision Making:   * review/order clinical lab tests   * review/order radiology tests    * review/order other diagnostic or tx interventions   * obtain old records or history from another person   * review and summarization of old records   * independent review of data- e.g. image- specimen   * Permanent chart problem/surgery list reviewed   * Permanent chart chronic med/ allergy list reviewed   * Permanent chart social/family history reviewed   * reviewed films     Risks & Benefits:   * Risks, benefits and treatment options discussed with patient.   Risk of complications: moderate   Time spent with patient:   New: 30 min   More than 50% of this time was spent discussing:   * Diagnosis   * Management   * Treatment Plan   * Diagnostic Results as indicated above    Raeanne Gathers, MD  Assistant Professor of Neurology   Director of [facility]   [address]   [place], [place] [address]????  [phone]

## 2015-01-29 ENCOUNTER — Encounter: Attending: Family Medicine

## 2015-02-03 MED ORDER — LISINOPRIL-HYDROCHLOROTHIAZIDE 20-25 MG PO TABS
20-25 MG | ORAL_TABLET | ORAL | Status: DC
Start: 2015-02-03 — End: 2015-06-30

## 2015-02-14 MED ORDER — LOVASTATIN 40 MG PO TABS
40 MG | ORAL_TABLET | ORAL | Status: DC
Start: 2015-02-14 — End: 2015-05-12

## 2015-02-24 MED ORDER — GLUCOSE BLOOD VI STRP
Freq: Every day | Status: DC
Start: 2015-02-24 — End: 2018-01-11

## 2015-02-24 NOTE — Telephone Encounter (Signed)
Error

## 2015-02-24 NOTE — Telephone Encounter (Signed)
Pt called asking for a refill on accu-check nano test strips to be sent to [facility] on [address].  Thank you!

## 2015-02-25 ENCOUNTER — Encounter: Attending: Family Medicine

## 2015-02-27 NOTE — Unmapped (Signed)
PA form will be faxed on 02/28/15 once signed by Dr.Zabeti.

## 2015-02-27 NOTE — Unmapped (Signed)
Pt will need exemption for Rebif, [employer] ph [phone].  Member ID [id number].

## 2015-03-04 NOTE — Unmapped (Signed)
Melanie [name] called to verify a determination form for Rebif was received yesterday. States they need more information and she faxed yesterday.  Ref. #[id number]

## 2015-03-04 NOTE — Unmapped (Signed)
I spoke with [facility] M./clinical pharmacist.  Rebif approved 03/04/15-03/03/16 Auth # [id number].  Call complete

## 2015-03-26 MED ORDER — BACLOFEN 10 MG PO TABS
10 MG | ORAL_TABLET | Freq: Three times a day (TID) | ORAL | Status: DC
Start: 2015-03-26 — End: 2015-05-12

## 2015-03-26 NOTE — Telephone Encounter (Signed)
Patient is due for follow up.  Please call to schedule.  One month of med refills will be provided to allow time for this.

## 2015-03-26 NOTE — Telephone Encounter (Signed)
App scheduled

## 2015-03-28 ENCOUNTER — Encounter: Attending: Family Medicine

## 2015-04-16 MED ORDER — PROVENTIL HFA 108 (90 BASE) MCG/ACT IN AERS
108 (90 Base) MCG/ACT | RESPIRATORY_TRACT | Status: DC
Start: 2015-04-16 — End: 2015-09-25

## 2015-04-16 MED ORDER — oxybutynin (DITROPAN-XL) 10 MG 24 hr tablet
10 | ORAL_TABLET | ORAL | Status: AC
Start: 2015-04-16 — End: 2016-03-02

## 2015-04-25 ENCOUNTER — Encounter: Payer: MEDICARE | Attending: Family Medicine

## 2015-05-01 MED ORDER — METFORMIN HCL 1000 MG PO TABS
1000 MG | ORAL_TABLET | ORAL | Status: DC
Start: 2015-05-01 — End: 2015-05-29

## 2015-05-13 MED ORDER — BACLOFEN 10 MG PO TABS
10 MG | ORAL_TABLET | ORAL | Status: DC
Start: 2015-05-13 — End: 2015-09-14

## 2015-05-13 MED ORDER — LOVASTATIN 40 MG PO TABS
40 MG | ORAL_TABLET | ORAL | Status: DC
Start: 2015-05-13 — End: 2015-10-10

## 2015-05-16 ENCOUNTER — Encounter

## 2015-05-19 ENCOUNTER — Ambulatory Visit: Admit: 2015-05-19

## 2015-05-19 ENCOUNTER — Encounter: Payer: MEDICARE | Attending: Family Medicine

## 2015-05-27 NOTE — Unmapped (Signed)
Spoke with pt and advised her to contact her PCP.  Pt stated she will call office.

## 2015-05-27 NOTE — Unmapped (Signed)
Pt requesting vit B12 shot because her energy levels have been down.

## 2015-05-29 MED ORDER — METFORMIN HCL 1000 MG PO TABS
1000 MG | ORAL_TABLET | ORAL | Status: DC
Start: 2015-05-29 — End: 2015-06-30

## 2015-05-29 NOTE — Telephone Encounter (Signed)
Patient is due for follow up.  Please call her to schedule.  One month of med refills will be provided to allow time for this.

## 2015-05-30 NOTE — Telephone Encounter (Signed)
She is very overdue for diabetes follow up.  These issues should be addressed at her next visit. (soon hopefully)

## 2015-05-30 NOTE — Telephone Encounter (Signed)
Made appt

## 2015-05-30 NOTE — Telephone Encounter (Signed)
Patient is wanting to know can she get the B-12 shot  Patient wants to know can any of theses creams for her MS be prescribed    lidocaine cream or primocaine or voltaren gel .Marland Kitchen.  Patient is going out of town 6.9.16

## 2015-06-02 NOTE — Telephone Encounter (Signed)
Pt has appt with courtney on 6.8.2016

## 2015-06-04 ENCOUNTER — Encounter

## 2015-06-04 ENCOUNTER — Ambulatory Visit: Admit: 2015-06-04 | Discharge: 2015-06-04 | Payer: MEDICARE | Attending: Nurse Practitioner

## 2015-06-04 DIAGNOSIS — R350 Frequency of micturition: Secondary | ICD-10-CM

## 2015-06-04 LAB — POCT URINALYSIS DIPSTICK
Bilirubin, UA: NEGATIVE
Blood, UA POC: NEGATIVE
Glucose, UA POC: NEGATIVE
Ketones, UA: NEGATIVE
Nitrite, UA: NEGATIVE
Protein, UA POC: NEGATIVE
Spec Grav, UA: 1.015
Urobilinogen, UA: 1
pH, UA: 7

## 2015-06-04 MED ORDER — NITROFURANTOIN MONOHYD MACRO 100 MG PO CAPS
100 MG | ORAL_CAPSULE | Freq: Two times a day (BID) | ORAL | Status: DC
Start: 2015-06-04 — End: 2015-06-05

## 2015-06-04 NOTE — Progress Notes (Signed)
Subjective:      Patient ID: Danielle Miles is a 59 y.o. female.  Chief Complaint   Patient presents with   ??? Other     lack of energy, would like a B12 shot   ??? Urinary Tract Infection     urinating more often, more at night      Allergies   Allergen Reactions   ??? Compazine [Prochlorperazine]        Current Outpatient Prescriptions   Medication Sig Dispense Refill   ??? metFORMIN (GLUCOPHAGE) 1000 MG tablet TAKE ONE TABLET BY MOUTH TWICE A DAY WITH MEALS 60 tablet 0   ??? baclofen (LIORESAL) 10 MG tablet TAKE ONE TABLET BY MOUTH THREE TIMES A DAY 90 tablet 3   ??? lovastatin (MEVACOR) 40 MG tablet TAKE ONE TABLET BY MOUTH DAILY 30 tablet 3   ??? PROVENTIL HFA 108 (90 BASE) MCG/ACT inhaler INHALE TWO PUFFS BY MOUTH EVERY 6 HOURS AS NEEDED FOR WHEEZING OR FOR SHORTNESS OF BREATH 1 Inhaler 0   ??? glucose blood VI test strips (ACCU-CHEK AVIVA) strip 1 each by In Vitro route daily E11.9  Accucheck Nano 100 each 0   ??? lisinopril-hydrochlorothiazide (PRINZIDE;ZESTORETIC) 20-25 MG per tablet TAKE ONE TABLET BY MOUTH DAILY 30 tablet 4   ??? LORazepam (ATIVAN) 0.5 MG tablet Take 1 tab by mouth an hour prior to MRI scan.  May repeat dose x1. 2 tablet 0   ??? gabapentin (NEURONTIN) 300 MG capsule Take 300 mg by mouth 3 times daily     ??? oxybutynin (DITROPAN-XL) 10 MG CR tablet Take 10 mg by mouth daily     ??? ONE TOUCH LANCETS MISC 1 each by Does not apply route daily 100 each 3   ??? glucose blood VI test strips (ONE TOUCH ULTRA TEST) STRP Use once daily and as needed. 100 strip 5   ??? pravastatin (PRAVACHOL) 40 MG TABS Take 40 mg by mouth every evening 30 tablet 5   ??? Cholecalciferol (VITAMIN D3) 50000 UNITS CAPS Take by mouth once a week       No current facility-administered medications for this visit.       Filed Vitals:    06/04/15 1124   BP: 126/78   Pulse: 100   Temp: 98.4 ??F (36.9 ??C)   TempSrc: Oral   Resp: 20   Weight: 226 lb (102.513 kg)     Body mass index is 38.77 kg/(m^2).     Wt Readings from Last 3 Encounters:   06/04/15 226 lb  (102.513 kg)   12/09/14 226 lb (102.513 kg)   10/16/14 226 lb (102.513 kg)     BP Readings from Last 3 Encounters:   06/04/15 126/78   12/09/14 130/78   10/08/14 126/82       Urinary Frequency   This is a new problem. Episode onset: 2 weeks. The problem occurs intermittently. The problem has been unchanged. The patient is experiencing no pain. There has been no fever. She is not sexually active. Associated symptoms include frequency and urgency. Pertinent negatives include no discharge, hematuria, hesitancy, nausea or vomiting. She has tried nothing for the symptoms.   She is on oxybutynin 10 mg daily for her OAB. She reports that her neurologist Dr. Genelle GatherZabeti prescribes this for her. She has not seen an urologist.     Patient reports that she is having increased fatigue in the past couple of weeks. 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 She reports that  sometimes she has a hard time completing the dance. Denies chest pain or shortness of breath. She has not seen her neurologist Dr. Genelle Gather in over a year to f/u on her MS. She would like to have a vitamin B12 injection to help with her fatigue. She also reports that she used to take vitamin D prescription but then was told that her level was normal and to take OTC vitamin D. She did not feel that the OTC vitamin D helped.     Review of Systems   Constitutional: Positive for fatigue. Negative for fever.   Respiratory: Negative for cough, chest tightness and shortness of breath.    Cardiovascular: Negative for chest pain.   Gastrointestinal: Negative for nausea, vomiting, abdominal pain, diarrhea and constipation.   Genitourinary: Positive for urgency and frequency. Negative for dysuria, hesitancy, hematuria, vaginal discharge, difficulty urinating, vaginal pain and pelvic pain.       Objective:   Physical Exam   Constitutional: She is oriented to person, place, and time. She appears well-developed and well-nourished. No distress.   HENT:   Head: Normocephalic  and atraumatic.   Eyes: EOM are normal.   Neck: Neck supple.   Cardiovascular: Normal rate, regular rhythm and normal heart sounds.  Exam reveals no gallop and no friction rub.    No murmur heard.  Pulmonary/Chest: Effort normal and breath sounds normal. No respiratory distress.   Abdominal: Soft. Normal appearance. There is tenderness in the suprapubic area. There is no rebound, no guarding and no CVA tenderness.   Neurological: She is alert and oriented to person, place, and time.   Skin: Skin is warm and dry.   Psychiatric: She has a normal mood and affect. Her behavior is normal. Judgment and thought content normal.   Nursing note and vitals reviewed.      Assessment:      1. Urinary frequency    2. Urinary tract infection, site unspecified    3. Fatigue, unspecified type    4. Vitamin D deficiency          Plan:      Stephinie was seen today for urinary symptoms.    Diagnoses and associated orders for this visit:    Urinary frequency  - POCT Urinalysis no Micro- Urine dipstick shows positive for small amount of leukocytes.  - URINE CULTURE  Discussed with patient that if urine culture is negative then her urinary frequency could be r/t worsening of her OAB symptoms.    Urinary tract infection, site unspecified: will start on an antibiotic for UTI while waiting for urine culture  - URINE CULTURE  - nitrofurantoin, macrocrystal-monohydrate, (MACROBID) 100 MG capsule; Take 1 capsule by mouth 2 times daily for 10 days  Increase water intake.    Fatigue, unspecified type: patient wanted to have a vitamin B12 injection today. Discussed that she would need to have her blood work completed first. If she wanted to start on a supplement today she could take OTC vitamin B12.  - Vitamin B12 & Folate; Future  - Vitamin D 25 Hydroxy; Future  - CBC Auto Differential; Future  - TSH with Reflex; Future  Discussed with patient that if blood work is normal then her fatigue is r/t another cause. She needs to make her f/u appt with Dr.  Briant Cedar to discuss her diabetes. She needs to make a f/u appt with her neurologist Dr. Karlene Lineman to discuss her MS. She also has a history of sleep apnea but does not wear  her cpap. Discussed with patient that uncontrolled sleep apnea can cause fatigue.     Vitamin D deficiency: history of deficiency. Will recheck level.   - Vitamin D 25 Hydroxy; Future

## 2015-06-05 LAB — CBC WITH AUTO DIFFERENTIAL
Basophils %: 1 %
Basophils Absolute: 0.1 10*3/uL (ref 0.0–0.2)
Eosinophils %: 1.3 %
Eosinophils Absolute: 0.1 10*3/uL (ref 0.0–0.6)
Hematocrit: 41.6 % (ref 36.0–48.0)
Hemoglobin: 13.7 g/dL (ref 12.0–16.0)
Lymphocytes %: 26.1 %
Lymphocytes Absolute: 2.2 10*3/uL (ref 1.0–5.1)
MCH: 30.2 pg (ref 26.0–34.0)
MCHC: 32.8 g/dL (ref 31.0–36.0)
MCV: 91.9 fL (ref 80.0–100.0)
MPV: 9.5 fL (ref 5.0–10.5)
Monocytes %: 9.8 %
Monocytes Absolute: 0.8 10*3/uL (ref 0.0–1.3)
Neutrophils %: 61.8 %
Neutrophils Absolute: 5.2 10*3/uL (ref 1.7–7.7)
Platelets: 214 10*3/uL (ref 135–450)
RBC: 4.52 M/uL (ref 4.00–5.20)
RDW: 13.2 % (ref 12.4–15.4)
WBC: 8.4 10*3/uL (ref 4.0–11.0)

## 2015-06-05 LAB — VITAMIN B12 & FOLATE
Folate: 12.13 ng/mL (ref 3.10–17.50)
Vitamin B-12: 338 pg/mL (ref 211–911)

## 2015-06-05 LAB — CULTURE, URINE: Urine Culture, Routine: <10000

## 2015-06-05 LAB — VITAMIN D 25 HYDROXY: Vit D, 25-Hydroxy: 50.3 ng/mL (ref >=30)

## 2015-06-05 LAB — TSH WITH REFLEX: TSH: 1.94 u[IU]/mL (ref 0.27–4.20)

## 2015-06-12 NOTE — Patient Instructions (Signed)
PRE-OP INSTRUCTIONS  630arrival      ?? Do not eat or drink anything after 12:00 midnight prior to surgery.  This includes water, chewing gum, mints and ice chips.  You may brush your teeth and gargle the morning of surgery but DO  NOT SWALLOW THE WATER.    Take the following medications with a small sip of water on the morning of surgery:  Lisinopril    ?? If you use an inhaler, please use it the morning of surgery and bring with you to hospital.    ?? You may be asked to stop blood thinners such as:  Coumadin, Plavix, Fragmin and lovenox.  Please check with your doctor before stopping these or any other medications.    ?? Aspirin, ibuprofen, advil and naproxen, any anti-inflammatory products should be stopped for a week prior to your surgery.    ?? Do not smoke and do not drink any alcoholic beverages 24 hours prior to your surgery.    ?? Please do not wear any jewelry or body piercings on the day of surgery.    ?? Please wear something simple, loose fitting clothing to the hospital.  Do not wear any make-up(including eye make-up) or nail polish on your fingers and toes.    ?? As part of our patient safety program to minimize surgical infections, we ask you to do the following:                    1. Please notify your surgeon if you develop any illness between now                          and the day of your surgery.  This includes a cough, cold, fever, sore                       throat, nausea, vomiting, diarrhea, etc.  Also please notify your                                  surgeon if you experience dizziness, shortness of breath or                       Blurred vision between now and the time of your surgery.                   2.  Please notify your surgeon of any open or redden areas that may                        look infected                     3.  DO NOT shave your operative site 96 hours(four days) prior to                                  surgery.                        4.  Shower the week before surgery  with an antibacterial soap, such as                       dial, safeguard, etc.  5.  Three(3) days prior to your surgery, cleanse the operative site with                          Hibiclens(anti-microbial soap).  This soap may dry your skin, please                          do not apply any oils or lotions     ?? Please bring your insurance card and picture ID day of surgery    ?? If you have a living will or durable power of attorny. Please bring in a copy of your advanced directives.    ?? If you have dentures, they will be removed before going to the OR, we will provide you with a container.  If you wear contact lenses or glasses, they will be removes, please bring a case for them.    ?? Have you seen your family doctor for a pre-op history and physical.      ?? Surgery scheduler will call you 48 hours prior to your surgery to notify you of the time of your surgery and the time you will need to be at hospital...patients are asked to arrive 21/2 hours prior to surgery.    ??  Please call Pre-Admission testing if you have any further questions.                  [facility] Pre-Admission testing phone number:  [phone]      Thank You for choosing [facility]!!

## 2015-06-16 ENCOUNTER — Inpatient Hospital Stay: Admit: 2015-06-16 | Attending: Gastroenterology

## 2015-06-16 LAB — POCT GLUCOSE
POC Glucose: 124 mg/dl — ABNORMAL HIGH (ref 70–99)
POC Glucose: 122 mg/dl — ABNORMAL HIGH (ref 70–99)

## 2015-06-16 MED ORDER — ONDANSETRON HCL 4 MG/2ML IJ SOLN
4 MG/2ML | Freq: Once | INTRAMUSCULAR | Status: AC | PRN
Start: 2015-06-16 — End: 2015-06-16

## 2015-06-16 MED ORDER — MORPHINE SULFATE (PF) 2 MG/ML IV SOLN
2 MG/ML | INTRAVENOUS | Status: DC | PRN
Start: 2015-06-16 — End: 2015-06-17

## 2015-06-16 MED ORDER — FENTANYL CITRATE (PF) 100 MCG/2ML IJ SOLN
100 MCG/2ML | INTRAMUSCULAR | Status: DC | PRN
Start: 2015-06-16 — End: 2015-06-17

## 2015-06-16 MED ORDER — MEPERIDINE HCL 25 MG/ML IJ SOLN
25 MG/ML | INTRAMUSCULAR | Status: DC | PRN
Start: 2015-06-16 — End: 2015-06-17

## 2015-06-16 MED ORDER — SODIUM CHLORIDE 0.9 % IV SOLN
0.9 % | INTRAVENOUS | Status: DC
Start: 2015-06-16 — End: 2015-06-17
  Administered 2015-06-16: 11:00:00 via INTRAVENOUS

## 2015-06-16 MED ORDER — NORMAL SALINE FLUSH 0.9 % IV SOLN
0.9 % | Freq: Two times a day (BID) | INTRAVENOUS | Status: DC
Start: 2015-06-16 — End: 2015-06-17

## 2015-06-16 MED ORDER — NORMAL SALINE FLUSH 0.9 % IV SOLN
0.9 % | INTRAVENOUS | Status: DC | PRN
Start: 2015-06-16 — End: 2015-06-17

## 2015-06-16 NOTE — Progress Notes (Signed)
Awake, passing flatus, denies abd pain, vss on ra

## 2015-06-16 NOTE — Other (Signed)
Sedation provided per anesthesia.  See anesthesia record for medication administration and vitals.

## 2015-06-16 NOTE — Progress Notes (Signed)
Doing well up in chair instructions given VSS

## 2015-06-16 NOTE — H&P (Addendum)
Pre-operative History and Physical    Patient: Danielle Miles  DOB: January 05, 1956  Acct#:     Intended Procedure: Colonoscopy    HISTORY OF PRESENT ILLNESS:  The patient is a 59 y.o. female  who presents for/due to Screening/No FH of CRC      Past Medical History:        Diagnosis Date   ??? Difficult intubation    ??? HTN (hypertension) 07/15/2014   ??? Multiple sclerosis (HCC)- dx ~2000 07/15/2014   ??? Type 2 diabetes mellitus (HCC) (dx 2013) 07/15/2014     Past Surgical History:        Procedure Laterality Date   ??? Hysterectomy       due to prolapse   ??? Anterior cruciate ligament repair     ??? Appendectomy       Medications Prior to Admission:   Current Outpatient Prescriptions on File Prior to Encounter   Medication Sig Dispense Refill   ??? metFORMIN (GLUCOPHAGE) 1000 MG tablet TAKE ONE TABLET BY MOUTH TWICE A DAY WITH MEALS 60 tablet 0   ??? baclofen (LIORESAL) 10 MG tablet TAKE ONE TABLET BY MOUTH THREE TIMES A DAY 90 tablet 3   ??? lovastatin (MEVACOR) 40 MG tablet TAKE ONE TABLET BY MOUTH DAILY 30 tablet 3   ??? PROVENTIL HFA 108 (90 BASE) MCG/ACT inhaler INHALE TWO PUFFS BY MOUTH EVERY 6 HOURS AS NEEDED FOR WHEEZING OR FOR SHORTNESS OF BREATH 1 Inhaler 0   ??? glucose blood VI test strips (ACCU-CHEK AVIVA) strip 1 each by In Vitro route daily E11.9  Accucheck Nano 100 each 0   ??? lisinopril-hydrochlorothiazide (PRINZIDE;ZESTORETIC) 20-25 MG per tablet TAKE ONE TABLET BY MOUTH DAILY 30 tablet 4   ??? LORazepam (ATIVAN) 0.5 MG tablet Take 1 tab by mouth an hour prior to MRI scan.  May repeat dose x1. 2 tablet 0   ??? gabapentin (NEURONTIN) 300 MG capsule Take 300 mg by mouth 3 times daily     ??? oxybutynin (DITROPAN-XL) 10 MG CR tablet Take 10 mg by mouth nightly      ??? Cholecalciferol (VITAMIN D3) 50000 UNITS CAPS Take by mouth once a week     ??? ONE TOUCH LANCETS MISC 1 each by Does not apply route daily 100 each 3   ??? glucose blood VI test strips (ONE TOUCH ULTRA TEST) STRP Use once daily and as needed. 100 strip 5   ??? pravastatin  (PRAVACHOL) 40 MG TABS Take 40 mg by mouth every evening 30 tablet 5     No current facility-administered medications on file prior to encounter.         Allergies:  Compazine [prochlorperazine]    Social History:   TOBACCO:   reports that she has never smoked. She has never used smokeless tobacco.  ETOH:   reports that she drinks alcohol.  DRUGS:   reports that she does not use illicit drugs.    PHYSICAL EXAM:      Vital Signs: There were no vitals taken for this visit.   Airway: No stridor or wheezing noted.  Good air movement  Pulmonary: without wheezes.  Clear to auscultation  Cardiac:regular rate and rhythm without loud murmurs  Abdomen:soft, nontender,  Bowel sounds present    Pre-Procedure Assessment / Plan:  1) Colonoscopy    ASA Grade:  ASA 3 - Patient with moderate systemic disease with functional limitations  Mallampati Classification:  Class III    Level of Sedation Plan:Deep sedation  Post Procedure plan: Return to same level of care    I assessed the patient and find that the patient is in satisfactory condition to proceed with the planned procedure and sedation plan.    I have explained the risk, benefits, and alternatives to the procedure; the patient understands and agrees to proceed.       Cleotis Lema, MD  06/16/2015

## 2015-06-16 NOTE — Progress Notes (Signed)
In phase two awake alert oriented VS'S up to the chair moving well

## 2015-06-16 NOTE — Progress Notes (Signed)
1.  Patient is identified using name and date of birth.  2.  The patient is free from signs and symptoms of injury.  3.  The patient receives appropriate medication(s), safely administered during the perioperative period.  4.  The patient had wound/tissuue perfusion consistent with or improved from baseline levels established preoperatively.  5.  The patient is at or returning to normothermia at the conclusion of the immediate postoperative period.  6.  The patient's fluid, electrolyte, and acid base balances are consistent with or improved from baseline levels established preoperatively.  7.  The patient's pulmonary function is consistent with or improved from baseline levels established preoperatively.  8.  The patient's cardiovascular status is consistent with or improved from baseline levels established preoperatively.  9.  The patient/caregiver participates in decisions affecting his or her perioperative care.  10.  The patient's care is consistent with the individualized perioperative plan of care.  11.  The patient's right to privacy is maintained.  12.  The patient is the recipient of competent and ethical care within legal standards of practice.  13.  The patient's value system, lifestyle, ethnicity, and culture are considered, respected, and incorporated in the perioperative plan of care.  14.  The patient demonstrates and/or reports adequate pain control throughout the perioperative period.  15.  The patient's neurological status is consistent with or improved from baseline levels established preoperatively.  16.  The patient/caregiver demonstrates knowledge of the expected responses to the operative or invasive procedure.  17.  Patient/caregiver has reduced anxiety.  Interventions - familiarize with environment and equipment.

## 2015-06-16 NOTE — Progress Notes (Signed)
Arrives to pacu, drowsy, does follow commands, assists with turning, abd round, soft, passing flatus

## 2015-06-16 NOTE — Anesthesia Pre-Procedure Evaluation (Signed)
Department of Anesthesiology  Preprocedure Note       Name:  Danielle Miles   Age:  59 y.o.  DOB:  December 04, 1956                                          MRN:  [id number]         Date:  06/16/2015        [facility] Department of Anesthesiology  Pre-Anesthesia Evaluation/Consultation       Name:  Danielle Miles                                         Age:  59 y.o.  MRN:  [id number]           Procedure (Scheduled):  colonoscopy  Surgeon:  Dr. Mathis Dad     Allergies   Allergen Reactions   . Compazine [Prochlorperazine]      Patient Active Problem List   Diagnosis   . HTN (hypertension)   . Type 2 diabetes mellitus (HCC) (dx 2013)   . Overactive bladder- related to MS   . Multiple sclerosis (HCC)- dx ~2000; Dr Jill Alexanders at [facility]   . Fibromyalgia- dx by DR Fran Lowes    . OSA (obstructive sleep apnea)- has CPAP, but not using.   Marland Kitchen Hyperlipidemia   . Mild nonproliferative diabetic retinopathy without macular edema associated with type 2 diabetes mellitus (HCC) Dr Uvaldo Bristle     Past Medical History   Diagnosis Date   . Difficult intubation    . HTN (hypertension) 07/15/2014   . Multiple sclerosis (HCC)- dx ~2000 07/15/2014   . Type 2 diabetes mellitus (HCC) (dx 2013) 07/15/2014     Past Surgical History   Procedure Laterality Date   . Hysterectomy       due to prolapse   . Anterior cruciate ligament repair     . Appendectomy       Social History   Substance Use Topics   . Smoking status: Never Smoker   . Smokeless tobacco: Never Used   . Alcohol use Yes      Comment: occ mixed drinks.     Medications  Current Outpatient Prescriptions on File Prior to Encounter   Medication Sig Dispense Refill   . metFORMIN (GLUCOPHAGE) 1000 MG tablet TAKE ONE TABLET BY MOUTH TWICE A DAY WITH MEALS 60 tablet 0   . baclofen (LIORESAL) 10 MG tablet TAKE ONE TABLET BY MOUTH THREE TIMES A DAY 90 tablet 3   . lovastatin (MEVACOR) 40 MG tablet TAKE ONE TABLET BY MOUTH DAILY 30 tablet 3   . PROVENTIL HFA 108 (90 BASE) MCG/ACT inhaler INHALE TWO PUFFS BY MOUTH EVERY 6  HOURS AS NEEDED FOR WHEEZING OR FOR SHORTNESS OF BREATH 1 Inhaler 0   . glucose blood VI test strips (ACCU-CHEK AVIVA) strip 1 each by In Vitro route daily E11.9  Accucheck Nano 100 each 0   . lisinopril-hydrochlorothiazide (PRINZIDE;ZESTORETIC) 20-25 MG per tablet TAKE ONE TABLET BY MOUTH DAILY 30 tablet 4   . LORazepam (ATIVAN) 0.5 MG tablet Take 1 tab by mouth an hour prior to MRI scan.  May repeat dose x1. 2 tablet 0   . gabapentin (NEURONTIN) 300 MG capsule Take 300 mg by mouth 3 times daily     .  oxybutynin (DITROPAN-XL) 10 MG CR tablet Take 10 mg by mouth nightly      . Cholecalciferol (VITAMIN D3) 50000 UNITS CAPS Take by mouth once a week     . ONE TOUCH LANCETS MISC 1 each by Does not apply route daily 100 each 3   . glucose blood VI test strips (ONE TOUCH ULTRA TEST) STRP Use once daily and as needed. 100 strip 5   . pravastatin (PRAVACHOL) 40 MG TABS Take 40 mg by mouth every evening 30 tablet 5     No current facility-administered medications on file prior to encounter.      Current Outpatient Prescriptions   Medication Sig Dispense Refill   . metFORMIN (GLUCOPHAGE) 1000 MG tablet TAKE ONE TABLET BY MOUTH TWICE A DAY WITH MEALS 60 tablet 0   . baclofen (LIORESAL) 10 MG tablet TAKE ONE TABLET BY MOUTH THREE TIMES A DAY 90 tablet 3   . lovastatin (MEVACOR) 40 MG tablet TAKE ONE TABLET BY MOUTH DAILY 30 tablet 3   . PROVENTIL HFA 108 (90 BASE) MCG/ACT inhaler INHALE TWO PUFFS BY MOUTH EVERY 6 HOURS AS NEEDED FOR WHEEZING OR FOR SHORTNESS OF BREATH 1 Inhaler 0   . glucose blood VI test strips (ACCU-CHEK AVIVA) strip 1 each by In Vitro route daily E11.9  Accucheck Nano 100 each 0   . lisinopril-hydrochlorothiazide (PRINZIDE;ZESTORETIC) 20-25 MG per tablet TAKE ONE TABLET BY MOUTH DAILY 30 tablet 4   . LORazepam (ATIVAN) 0.5 MG tablet Take 1 tab by mouth an hour prior to MRI scan.  May repeat dose x1. 2 tablet 0   . gabapentin (NEURONTIN) 300 MG capsule Take 300 mg by mouth 3 times daily     . oxybutynin  (DITROPAN-XL) 10 MG CR tablet Take 10 mg by mouth nightly      . Cholecalciferol (VITAMIN D3) 50000 UNITS CAPS Take by mouth once a week     . ONE TOUCH LANCETS MISC 1 each by Does not apply route daily 100 each 3   . glucose blood VI test strips (ONE TOUCH ULTRA TEST) STRP Use once daily and as needed. 100 strip 5   . pravastatin (PRAVACHOL) 40 MG TABS Take 40 mg by mouth every evening 30 tablet 5     Current Facility-Administered Medications   Medication Dose Route Frequency Provider Last Rate Last Dose   . 0.9 % sodium chloride infusion   Intravenous Continuous Blossom Hoops, MD       . sodium chloride flush 0.9 % injection 10 mL  10 mL Intravenous 2 times per day Blossom Hoops, MD       . sodium chloride flush 0.9 % injection 10 mL  10 mL Intravenous PRN Blossom Hoops, MD         Vital Signs (Current)   Vitals:    06/16/15 0703   BP: (!) 134/98   Pulse: 102   Resp: 16   Temp: 97.9 F (36.6 C)   SpO2: 97%     Vital Signs Statistics (for past 48 hrs)     BP  Min: 134/98   Min taken time: 06/16/15 0703  Max: 134/98   Max taken time: 06/16/15 0703  Temp  Avg: 97.9 F (36.6 C)  Min: 97.9 F (36.6 C)   Min taken time: 06/16/15 0703  Max: 97.9 F (36.6 C)   Max taken time: 06/16/15 0703  Pulse  Avg: 102  Min: 102   Min taken time: 06/16/15 0703  Max: 102   Max taken time: 06/16/15 0703  Resp  Avg: 16  Min: 16   Min taken time: 06/16/15 0703  Max: 16   Max taken time: 06/16/15 0703  SpO2  Avg: 97 %  Min: 97 %   Min taken time: 06/16/15 0703  Max: 97 %   Max taken time: 06/16/15 0703    BP Readings from Last 3 Encounters:   06/16/15 (!) 134/98   06/04/15 126/78   12/09/14 130/78     BMI  Body mass index is 37.58 kg/(m^2).  Estimated body mass index is 37.58 kg/(m^2) as calculated from the following:    Height as of this encounter: 5\' 4"  (1.626 m).    Weight as of this encounter: 218 lb 14.7 oz (99.3 kg).    CBC   Lab Results   Component Value Date    WBC 8.4 06/04/2015    RBC 4.52 06/04/2015    HGB 13.7 06/04/2015     HCT 41.6 06/04/2015    MCV 91.9 06/04/2015    RDW 13.2 06/04/2015    PLT 214 06/04/2015     CMP    Lab Results   Component Value Date    NA 144 07/16/2014    K 4.0 07/16/2014    CL 101 07/16/2014    CO2 28 07/16/2014    BUN 9 07/16/2014    CREATININE 0.6 07/16/2014    GFRAA >60 07/16/2014    AGRATIO 1.5 07/16/2014    LABGLOM >60 07/16/2014    GLUCOSE 130 07/16/2014    PROT 7.2 07/16/2014    CALCIUM 9.4 07/16/2014    BILITOT <0.2 07/16/2014    ALKPHOS 87 07/16/2014    AST 36 07/16/2014    ALT 52 07/16/2014     BMP    Lab Results   Component Value Date    NA 144 07/16/2014    K 4.0 07/16/2014    CL 101 07/16/2014    CO2 28 07/16/2014    BUN 9 07/16/2014    CREATININE 0.6 07/16/2014    CALCIUM 9.4 07/16/2014    GFRAA >60 07/16/2014    LABGLOM >60 07/16/2014    GLUCOSE 130 07/16/2014     POCGlucose  No results for input(s): GLUCOSE in the last 72 hours.   Coags  No results found for: PROTIME, INR, APTT  HCG (If Applicable) No results found for: PREGTESTUR, PREGSERUM, HCG, HCGQUANT   ABGs No results found for: PHART, PO2ART, PCO2ART, HCO3ART, BEART, O2SATART   Type & Screen (If Applicable)  No results found for: LABABO, LABRH    NPO Status:                                                    > 8 hours                             BMI:   Wt Readings from Last 3 Encounters:   06/12/15 228 lb (103.4 kg)   06/04/15 226 lb (102.5 kg)   12/09/14 226 lb (102.5 kg)     There is no height or weight on file to calculate BMI.    Anesthesia Evaluation  Patient summary reviewed and Nursing notes reviewed history of anesthetic complications:   Airway: Mallampati: III  TM distance: >  3 FB   Neck ROM: full  Mouth opening: > = 3 FB Dental:          Pulmonary:   (+) shortness of breath:  sleep apnea: on noncompliant,      (-) pneumonia, COPD, asthma and recent URI       Cardiovascular:  Exercise tolerance: good (>4 METS),   (+) hypertension:,     (-) pacemaker, valvular problems/murmurs, past MI, CAD, CABG/stent, dysrhythmias,  angina,   CHF, orthopnea, PND and  DOE      Rhythm: regular  Rate: normal                 Neuro/Psych:   (+) neuromuscular disease:,    (-) seizures, TIA, CVA, headaches and psychiatric history  GI/Hepatic/Renal:        (-) hiatal hernia, GERD, PUD, hepatitis, liver disease and bowel prep     Endo/Other:    (+) Type II DM, ,     (-) hypothyroidism, hyperthyroidism, blood dyscrasia, arthritis    Abdominal:   (+) obese,                  Anesthesia Plan    ASA 3     general     intravenous induction   Anesthetic plan and risks discussed with patient.      This pre-anesthesia assessment maybe used a a history and physical.    DOS STAFF ADDENDUM:    Pt seen and examined, chart reviewed (including anesthesia, drug and allergy history).  No interval changes to history and physical examination.  Anesthetic plan, risks, benefits, alternatives, and personnel involved discussed with patient.  Patient verbalized an understanding and agrees to proceed.      Blossom Hoops, MD  June 16, 2015  7:08 AM        Blossom Hoops, MD   06/16/2015

## 2015-06-16 NOTE — Op Note (Addendum)
Colonoscopy Procedure Note      Patient: Danielle Miles  DOB: 1956-10-01  Acct#:     Procedure: Colonoscopy with biopsy    Date:  06/16/2015    Surgeon:  Cleotis Lema, MD    Referring Physician:  Lavon Paganini, MD    Preoperative Diagnosis:  1. Screening/No FH of CRC     Postoperative Diagnosis:  1. Colon Polyps     Consent:  The patient or their legal guardian has signed a consent, and is aware of the potential risks, benefits, alternatives, and potential complications of this procedure.  These include, but are not limited to hemorrhage, bleeding, post procedural pain, perforation, phlebitis, aspiration, hypotension, hypoxia, cardiovascular events such as arryhthmia, and possibly death.  Additionally, the possibility of missed colonic polyps and interval colon cancer was discussed in the consent.    Anesthesia: The patient was administered IV propofol per anesthesiology team.  Please see their operative records for full details.     Procedure:   An informed consent was obtained from the patient after explanation of indications, benefits, possible risks and complications of the procedure.  The patient was then taken to the endoscopy suite, placed in the left lateral decubitus position, and the above IV anesthesia was administered.    A digital rectal examination was performed and revealed negative without mass, lesions or tenderness.      The Olympus video colonoscope was placed in the patient's rectum under digital direction and advanced to the cecum. The cecum was identified by characteristic anatomy and ballottment.  The prep was good.  The ileocecal valve was identified.     The terminal ileum was not examined.     The scope was then withdrawn back through the cecum, ascending, transverse, descending, sigmoid colon, and rectum.  Careful circumferential examination of the mucosa in these areas demonstrated:    1. A 2 mm polyp in the ascending colon removed completely with biopsy  polypectomy.  2. A 5 mm polyp in the descending colon removed completely with biopsy polypectomy.  3. A 3  mm polyp in the sigmoid colon removed completely with biopsy polypectomy.    The scope was then withdrawn into the rectum and retroflexed.  The retroflexed view of the anal verge and rectum demonstrates normal rectum.  The scope was straightened, the colon was decompressed and the scope was withdrawn from the patient.  The patient tolerated the procedure well and was taken to the PACU in good condition.    Impression:  See post-procedure diagnoses.    Recommendations:  1. Await pathology results. Repeat Colonoscopy in 3-5 years pending pathology results. Will send letter to PCP with definitive surveillance interval once pathology results reviewed.   2. The patient had biopsies taken today.  The patient should call for results in 7 days if they have not heard from our office.  Our number is [phone].    Cleotis Lema, MD  [facility]  06/16/2015  [phone]

## 2015-06-17 MED FILL — FRESENIUS PROPOVEN 200 MG/20ML IV EMUL: 200 MG/20ML | INTRAVENOUS | Qty: 40

## 2015-06-17 MED FILL — LIDOCAINE HCL 2 % IJ SOLN: 2 % | INTRAMUSCULAR | Qty: 20

## 2015-06-23 NOTE — Anesthesia Post-Procedure Evaluation (Signed)
[facility] Department of Anesthesiology  Post-Anesthesia Note       Name:  Danielle Miles                                         Age:  59 y.o.  MRN:  [id number]     Last Vitals & Oxygen Saturation:   Visit Vitals   ??? BP 127/88   ??? Pulse 85   ??? Temp 97.3 ??F (36.3 ??C) (Temporal)   ??? Resp 18   ??? Ht 5\' 4"  (1.626 m)   ??? Wt 218 lb 14.7 oz (99.3 kg)   ??? SpO2 95%   ??? BMI 37.58 kg/m2     No data found.      Level of consciousness: awake, alert and oriented    Respiratory: stable     Cardiovascular: stable     Hydration: stable     PONV: stable     Post-op pain: adequate analgesia    Post-op assessment: no apparent anesthetic complications, tolerated procedure well and no evidence of recall    Complications:  None      Gerlene FeeKristopher L Mallika Sanmiguel, MD  June 23, 2015   6:06 AM

## 2015-07-01 MED ORDER — METFORMIN HCL 1000 MG PO TABS
1000 MG | ORAL_TABLET | ORAL | 0 refills | Status: DC
Start: 2015-07-01 — End: 2015-07-31

## 2015-07-01 MED ORDER — LISINOPRIL-HYDROCHLOROTHIAZIDE 20-25 MG PO TABS
20-25 MG | ORAL_TABLET | ORAL | 0 refills | Status: DC
Start: 2015-07-01 — End: 2015-07-31

## 2015-07-09 NOTE — Unmapped (Signed)
Spoke with patient she will fax her FMLA forms to me.  Patient having more spasticity in the am and more bilateral leg cramps, will be seen Tuesday for evaluation.

## 2015-07-09 NOTE — Unmapped (Signed)
Pt calling to speak with [name] regarding some issues she is having. Call was disconnected before she could tell me her phone number.

## 2015-07-09 NOTE — Unmapped (Signed)
The new symptoms or acute worsening of old symptoms that last for more than 24 hours can be due to MS exacerbation or secondary to infection/metabolic causes (Psuedo-exacerbation). Recommend:   CBCdiff, CMP and UA reflex culture   We can address increasing Baclofen or adding another Muscle Relaxer at her appt next week after bloodwork and urine tests.

## 2015-07-10 NOTE — Unmapped (Signed)
Left message to patient to come to lab to get UA and blood work drawn-Please relay message when patient calls office

## 2015-07-15 ENCOUNTER — Ambulatory Visit: Admit: 2015-07-15 | Payer: MEDICARE

## 2015-07-15 ENCOUNTER — Other Ambulatory Visit: Admit: 2015-07-15 | Payer: MEDICARE

## 2015-07-15 DIAGNOSIS — G35 Multiple sclerosis: Secondary | ICD-10-CM

## 2015-07-15 LAB — URINALYSIS, REFLEX TO CULTURE
Bilirubin, UA: NEGATIVE
Blood, UA: NEGATIVE
Glucose, UA: NEGATIVE mg/dL
Ketones, UA: NEGATIVE mg/dL
Nitrite, UA: NEGATIVE
Protein, UA: NEGATIVE mg/dL
RBC, UA: 1 /HPF (ref 0–3)
Specific Gravity, UA: 1.018 (ref 1.005–1.035)
Squam Epithel, UA: <1 /HPF (ref 0–5)
Urobilinogen, UA: <2 mg/dL (ref 0.2–1.9)
WBC, UA: 21 /HPF (ref 0–5)
pH, UA: 5 (ref 5.0–8.0)

## 2015-07-15 LAB — DIFFERENTIAL
Basophils Absolute: 32 /uL (ref 0–200)
Basophils Relative: 0.3 % (ref 0.0–1.0)
Eosinophils Absolute: 137 /uL (ref 15–500)
Eosinophils Relative: 1.3 % (ref 0.0–8.0)
Lymphocytes Absolute: 2982 /uL (ref 850–3900)
Lymphocytes Relative: 28.4 % (ref 15.0–45.0)
Monocytes Absolute: 924 /uL (ref 200–950)
Monocytes Relative: 8.8 % (ref 0.0–12.0)
Neutrophils Absolute: 6426 /uL (ref 1500–7800)
Neutrophils Relative: 61.2 % (ref 40.0–80.0)
nRBC: 0 /100{WBCs} (ref 0–0)

## 2015-07-15 LAB — COMPREHENSIVE METABOLIC PANEL, SERUM
ALT: 69 U/L — ABNORMAL HIGH (ref 7–52)
AST (SGOT): 45 U/L — ABNORMAL HIGH (ref 13–39)
Albumin: 4.3 g/dL (ref 3.5–5.7)
Alkaline Phosphatase: 60 U/L (ref 36–125)
Anion Gap: 9 mmol/L (ref 3–16)
BUN: 19 mg/dL (ref 7–25)
CO2: 33 mmol/L (ref 21–33)
Calcium: 9.9 mg/dL (ref 8.6–10.3)
Chloride: 94 mmol/L — ABNORMAL LOW (ref 98–110)
Creatinine: 0.64 mg/dL (ref 0.60–1.30)
Glucose: 137 mg/dL — ABNORMAL HIGH (ref 70–100)
Osmolality, Calculated: 286 mosm/kg (ref 278–305)
Potassium: 3.5 mmol/L (ref 3.5–5.3)
Sodium: 136 mmol/L (ref 133–146)
Total Bilirubin: 0.4 mg/dL (ref 0.0–1.5)
Total Protein: 8.2 g/dL (ref 6.4–8.9)
eGFR AA CKD-EPI: >90 See note.
eGFR NONAA CKD-EPI: >90 See note.

## 2015-07-15 LAB — CBC
Hematocrit: 42.4 % (ref 35.0–45.0)
Hemoglobin: 14.1 g/dL (ref 11.7–15.5)
MCH: 30.1 pg (ref 27.0–33.0)
MCHC: 33.3 g/dL (ref 32.0–36.0)
MCV: 90.6 fL (ref 80.0–100.0)
MPV: 9.2 fL (ref 7.5–11.5)
Platelets: 229 10*3/uL (ref 140–400)
RBC: 4.68 10*6/uL (ref 3.80–5.10)
RDW: 13 % (ref 11.0–15.0)
WBC: 10.5 10*3/uL (ref 3.8–10.8)

## 2015-07-15 LAB — URINE CULTURE

## 2015-07-15 MED ORDER — ibuprofen (ADVIL,MOTRIN) 800 MG tablet
800 | ORAL_TABLET | Freq: Three times a day (TID) | ORAL | Status: AC | PRN
Start: 2015-07-15 — End: 2016-01-30

## 2015-07-15 MED ORDER — LORazepam (ATIVAN) 0.5 MG tablet
0.5 | ORAL_TABLET | ORAL | Status: AC
Start: 2015-07-15 — End: 2016-08-31

## 2015-07-15 NOTE — Unmapped (Signed)
Date:  07/15/2015  Name: REEVE MALLO  Age:  59 y.o.      Date Symptom   Onset/Outcome Brain MRI Spine MRI Tests DMT period A/E Meds   2000 Fatigue, cognitive G/P        2004 Blurry vision B/L G/I  Dx MS    Rebif IVMP   2013  Stable 04-now  PV -ve      03/2014  09/2014 S 3/15 R leg W after cold    4/15 S       12/2014                  Onset: Acute/Gradual , Outcome: Resolved/Improved/Progressed, MRI: Peri Ventricular/Juxta Cortical,      HPI:    07/15/15  Ms Kayte is here for evaluation of worsening symptoms.  She reports that she has noticed that she has increased trouble walking when she gets up in the morning, she feels like she is a little of balance.  Once she has walked to the bathroom she is back to baseline.  She has also noticed that she has some pain in her right leg from halfway down her thigh to her calf which occurs every now and then for about 1 month. This has been going on for about 3 weeks, she otherwise has no other new symptoms.    01/08/15:  She thinks overall is stable, denies any new symptom or exacerbation. She received a steroid shot for knee pain which helped.      10/01/14:  I had the pleasure of evaluating Ms Bejarano who is here for evaluation of new symptoms.  She is on Rebif since 2004 and take is regulalry with no missed doses.  She developed new pain in her left knee and left leg, She also noticed increased numbness in that extremity which lasted about 3 days and it has started to improve.    04/09/14 Dr. Genelle Gather  On 4/2, she woke up and couldn't walk, felt her R leg was weak. Also lost sensation with tingling. She waited to see if this would get better. She called on 4/6 and Dr. Genelle Gather thought she is probably having exacerbation. Labs were done with CBC, BMP, UA which did not show evidence of infection. Nurse visited her at home to get IV access. IV solumedrol was started on 4/12. She got the second dose today. Since about Friday last week, sensation was coming back. Weakness is improved but  not back to baseline. She is able to walk. Dancing is still hard. She is still on Rebif, doing ok with the injection. This is a first real exacerbation since 2004.   Recent exacerbation happened after recent URI.    02/27/13  She was working as OR Haematologist at [employer]. She was feeling very tired with some cognitive and memory problem around 2000. Thne she had an episode of visual change in both eye in 2004 with partial recovery. She was Dx with MS based on MRI. She was started on Rebif . She thinks it works and tolerating it well however does not like injection. Her last MRI was from April 2013.   Last year she lost 25 Lbs by diet and exercise after she was diagnosed with Diabetes but she regain that weight. She is planning to go back in diet this week.     I reviewed Dr. 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 Pt is currently on Rebif and is tolerating it well. She has not had an MRI since January 2012. Since pts last visit she feels her condition is getting worse. She is experiencing cognitive issues including concentration and memory issues. She has also started to urinate uncontrollably and has missed work as a result. Pt is having trouble walking due to pain in both knees and L hip pain. She also complains of stress at work which causes her to be depressed.   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 Pt is currently on Rebif and is tolerating it well. She has not had an MRI since January 2012. Since pts last visit she feels her condition is getting worse. She is experiencing cognitive issues including concentration and memory issues. She has also started to urinate uncontrollably and has missed work as a result. Pt is having trouble walking due to pain in both knees and L hip pain. She also complains of stress at work which causes her to be depressed.   Due to  pts complaints of cognitive issues, we will refer her for a neuropsych eval. We will also give her the contact number to the social worker for the [facility]. I would like to obtain and MRI of the brain and c spine.      Review of System:    Weakness:  BLE when she first gets up  Pain:     R. Knee and hip, Steroid shot and Gabapentin   Sensory  Numbness r. foot  Fatigue:  bothersome  Sleep:        4 to 5 hrs uninterrupted,   Sleep Apnea:  No longer uses CPAP    STM   Main symptom she get disability   Urinary Incontinece Oxybutinin helps, she has done pelvic exercise, this was one the reason she stopped working   Walking Difficulty Tired and back pain, can do more after exercise   Imbalance  Trip over but no fall   Fatigue                        Exhausted    Life Style:      Living Situation: With daughters family  Diet:  Diet  Exercise: Line Dance as exercise 4 days a week   Vitamin D: None    Medications:      Current Outpatient Prescriptions   Medication Sig   ??? albuterol Inhale 2 puffs into the lungs. Every 4 to 6 hours as needed.    ??? baclofen TAKE ONE TABLET BY MOUTH THREE TIMES A DAY   ??? blood sugar diagnostic Test blood sugars up to twice a day   ??? gabapentin Take 1 capsule (300 mg total) by mouth 3 times a day. Indications: Neuropathic Pain   ??? inhalational spacing device by Misc.(Non-Drug; Combo Route) route. AEROCHAMBER PLUS.  Use with MDI    ??? interferon beta-1a (albumin) Inject 0.5 mLs (44 mcg total) subcutaneously every Monday, Wednesday, and Friday.   ??? lisinopril-hydrochlorothiazide Take 1 tablet by mouth daily.   ??? lovastatin Take 40 mg by mouth daily with dinner.   ??? metFORMIN Take 1 tablet (500 mg total) by mouth 2 times a day with meals.   ??? oxybutynin TAKE TWO TABLETS BY MOUTH DAILY   ??? ibuprofen Take 1 tablet (800 mg total) by mouth every 8 hours as needed.   ???  LORazepam 15 min before MRI, can be repeated after 15 min.     No current facility-administered medications for this visit.      Allergies:  Compazine and Prochlorperazine    History:      PMHx:  has a past medical history of Asthma; Multiple sclerosis; Hypertension; and Diabetes mellitus.     PSHx:  has past surgical history that includes Knee surgery; Appendectomy; and Hysterectomy.    Social Hx: reports that she has never smoked. She has never used smokeless tobacco. She reports that she drinks alcohol. She reports that she does not use illicit drugs. She lives by herself. She feels she is safe but has to take extra time. Feels depressed being lonely. Grandkids lives cross the street and come and visit her. She goes to [facility], do dance club and try to stay active.     ZOX:WRUEAV history includes Alcohol abuse in her father and other; Brain cancer in her other; COPD in her father; Cancer in her father, maternal grandmother, mother, and sister; Diabetes in her daughter, paternal aunt, paternal grandfather, and paternal uncle; Skin cancer in her other; Stomach cancer in her other; Throat cancer in her other.    Physical Exam:    BP 142/80 mmHg   Pulse 74   Ht 5' 4 (1.626 m)   Wt 222 lb (100.699 kg)   BMI 38.09 kg/m2     General: NAD  Resp:     Clear Bilaterally, regular unlabored breaths  CV:     RRR  No Edema, no skin rashes    Mental Status:Patient registered 3 objects without difficulty and recalled 3/3 after five minutes.  The patient was fully oriented and had a normal fund of knowledge.  Cranial Nerves: Color Vision12/12 OD and 10/12 OS, best corrected visual acuity was 20/20 OD and 20/20 OS.  There was no afferent pupillary deficit.  Funduscopic exam revealed normal optic nerve heads.   Horizontal and vertical eye movements were full.  There was no nystagmus.  Facial sensation was intact to pinprick.  There was no facial weakness.  Hearing was intact to finger rubbing bilaterally. Palate moved fully and symmetrically.  Tongue protruded in the midline.  There was no dysarthria.  Motor:   Strength:R-L (MRC Scale)   Deltoid  5 - 5   Hip Fx   4+ - 5  (R. Hip limited by pain)  Biceps   5 - 5   Knee Ex  5 - 5   Triceps  5 - 5   Knee Fx  5 - 5   Wrist Ex  5 - 5   Plantar Fx 5 - 5   Grip         5 - 5   Ankle dorsiflex 5 - 5  Tone was normal  Reflexes: R / L (MRC Scale)   Biceps  2 / 2   Quadriceps 2 / 2   Triceps  2 / 2   Achilles  2 / 2   Brachoradialis 2 / 2   Toes   Up / down   Coordination: There was no ataxia on finger to nose or heel to shin testing.  Sensation: Decreased vibration BLE.     Position and pinprick sensation were intact in all four extremities.  Gait and station:  Unable to do tandem walk and hopping on either foot.   T25-FW date Time assistance   02/26/13  6 No   04/09/14 7.5 No   10/01/14 8.26  No  01/08/14 5.4 No   07/15/15 5.8 No  Tests:      04/08/14 COMPARISON: MRI dated 03/27/2012.  MRI head:   1. No significant interval change in the moderate burden of T2 signal prolongation involving the periventricular, deep, and central pontine white matter consistent with the clinical diagnosis of multiple sclerosis. No new lesions, restricted diffusion, or enhancement is identified.   2. Small amount of layering fluid signal in the right maxillary sinus.       MRI cervical spine:   1. No evidence of cord signal abnormalities or enhancement.   2. Very mild multilevel degenerative discogenic changes without evidence of central canal or neuroforaminal stenosis, unchanged.       MRI thoracic spine:   1. No evidence of cord signal abnormalities or enhancement.   2. Very mild degenerative discogenic changes without evidence of central canal or neuroforaminal stenosis, involving the T3-T4 through T7-T8 levels.      Lab Results   Component Value Date    WBC 10.1 10/01/2014    HGB 14.9 10/01/2014    HCT 44.8 10/01/2014    MCV 91.2 10/01/2014    PLT 243 10/01/2014     Lab Results   Component Value Date    CREATININE 0.62 10/01/2014    BUN 12 10/01/2014    NA 137 10/01/2014    K 3.7 10/01/2014    CL 97* 10/01/2014    CO2 33 10/01/2014     Lab Results   Component Value  Date    ALT 57* 10/01/2014    AST 60* 04/02/2014    ALKPHOS 60 10/01/2014    BILITOT 0.4 10/01/2014     Lab Results   Component Value Date    VITD25H 25.9* 02/27/2013     Lab Results   Component Value Date    VITAMINB12 322 10/01/2014     Lab Results   Component Value Date    TSH 1.42 10/01/2014     07/16/14 Vit D 25 59.5    Assessment/Plan:     Ms Moe is a 59 y.o. female with Multiple Sclerosis diagnosed in 2004.  She has been on Rebif and her disease has not been active for some time.  Her complaints today of increased weakness in the mornings along with pain which improves is concerning, however it seems more consistent with mild progression and fluctuation of MS symptoms rather than MS Exacerbation given the transient presentation.  I recommend bloodwork and UA to r/o infectious disease and/or metabolic imbalance.  She is due for routine yearly MRI, I recommend including  Spinal MRI too at this time given her symptoms.      1. Multiple sclerosis  - Continue Rebif  - Differential; Standing  - CBC; Standing  - Comprehensive Metabolic Panel, Serum; Future  - Urinalysis, Reflex to Culture; Future  - MRI Head W WO contrast; Future  - MRI Cervical spine W WO contrast; Future  - MRI Thoracic spine W WO contrast; Future  - F/U with Dr. Genelle Gather in 6 months unless MRI shows new Ms Activity    2. Neuropathic pain  - Continue Gabapentin, we can consider increasing if pain becomes more bothersome    3. Weakness of both legs  - Continue PT home Exercise program from last PT session  - Recommend routine Stretching  - Recommend considering starting routine exercise program    4. Hip pain, right, intermittent , likely Musculoskeletal  -  Recommended trying NSAID, Ibuprofen  5. Mood change  - Psychotherapy; Future    6. Encounter for long-term (current) use of high-risk medication  - Differential; Standing  - CBC; Standing  - Comprehensive Metabolic Panel, Serum; Future  - Urinalysis, Reflex to Culture; Future  - MRI Head W WO  contrast; Future  - MRI Cervical spine W WO contrast; Future  - MRI Thoracic spine W WO contrast; Future      Medical Decision Making:   * review/order clinical lab tests   * review/order radiology tests   * review/order other diagnostic or tx interventions   * obtain old records or history from another person   * review and summarization of old records   * independent review of data- e.g. image- specimen   * Permanent chart problem/surgery list reviewed   * Permanent chart chronic med/ allergy list reviewed   * Permanent chart social/family history reviewed   * reviewed films     Risks & Benefits:   * Risks, benefits and treatment options discussed with patient.   Risk of complications: moderate   Time spent with patient:   New: 45 min   More than 50% of this time was spent discussing:   * Diagnosis   * Management   * Treatment Plan   * Diagnostic Results as indicated above    Kristie Cowman CNP  [facility]   [address]   [place], [place] [address]????  [phone]

## 2015-07-15 NOTE — Unmapped (Addendum)
Continue Rebif  Bloodwork and UA today  I recommend considering seeing a Psychotherapist or starting a low dose Anti Depressant   If the pain in your leg is occuring more frequently we can consider increasing your Gabapentin  Repeat MRI now  F/U with Dr. Genelle Gather in 6 month    ==============================    Healthy life style can significantly change the out come of Multiple Sclerosis. I strongly recommend you to consider:   1. Low salt and low fat diet  2. Routine exercise (swimming, yoga, ...)  3. Vitamin D level goal > 50   4. Avoid alcohol and smoking

## 2015-07-17 NOTE — Unmapped (Signed)
I spoke to Melanie [name] who wanted to know if we have could tell her when Melanie [name] was diagnosed with Diabetes and what meds she is on for diabetes as well as if her Glucose was well managed.  Unfortunately since we are not managing her for diabetes I was unable to supply most of the imnformation required and recommended that she speaks to either the pts PCP or the patient herself

## 2015-07-17 NOTE — Unmapped (Signed)
Spoke with Santina Evans- EMD [facility]. Calling in regards to patient becoming an diabetic while on Rebif. Would like to discuss further

## 2015-07-21 NOTE — Unmapped (Signed)
Forms completed will pick up tomorrow-complete

## 2015-07-21 NOTE — Unmapped (Signed)
Pt calling in regards to FMLA paperwork she dropped off last Friday. Are these being completed?

## 2015-07-25 ENCOUNTER — Ambulatory Visit: Admit: 2015-07-25 | Discharge: 2015-07-25 | Payer: MEDICARE | Attending: Family Medicine

## 2015-07-25 DIAGNOSIS — E7800 Pure hypercholesterolemia, unspecified: Secondary | ICD-10-CM

## 2015-07-25 NOTE — Progress Notes (Signed)
Subjective:      Patient ID: [facility] is a 59 y.o. female.    Blood pressure 126/72, pulse 84, temperature 98.2 ??F (36.8 ??C), temperature source Oral, resp. rate 22, height 5\' 4"  (1.626 m), weight 222 lb (100.7 kg), not currently breastfeeding.     HPI here for diabetes check up.  Is tardy in follow up. Last visit 10/15.   Vitals:    07/25/15 1057   BP: 126/72   Pulse: 84   Resp: 22   Temp: 98.2 ??F (36.8 ??C)     Body mass index is 38.11 kg/(m^2).    Wt Readings from Last 3 Encounters:   07/25/15 222 lb (100.7 kg)   06/16/15 218 lb 14.7 oz (99.3 kg)   06/12/15 228 lb (103.4 kg)      BP Readings from Last 3 Encounters:   07/25/15 126/72   06/16/15 127/88   06/04/15 126/78      Patient Active Problem List   Diagnosis   ??? HTN (hypertension)- on prinzide 20-25.  Denies side effects (no cough).  Normal renal function.  No chest pains, dizziness, heart palpitations, dyspnea, lightheadedness, worsening edema.   Lab Results   Component Value Date    NA 144 07/16/2014    K 4.0 07/16/2014    CL 101 07/16/2014    CO2 28 07/16/2014    BUN 9 07/16/2014    CREATININE 0.6 07/16/2014    GLUCOSE 130 07/16/2014    CALCIUM 9.4 07/16/2014        ??? Type 2 diabetes mellitus (HCC) (dx 2013)- only on metformin 2gm.  She does test her sugars in the mornings.  Does not have sugar log, but says her am sugars are generally 120's or less.  Does not test after meals.  Weight is about same.   Her approach to eating as a diabetic: 3 meals, low carb bread, higher protein meals.  Largest meal is dinner.  Tries to minimize starches.  Rarely has sugared drinks.    She puts lotion on feet daily    She exercises- doing line dancing almost every day.    Last eye visit:   9/15.    Lab Results   Component Value Date    LABA1C 7.2 07/16/2014     Lab Results   Component Value Date    EAG 159.9 07/16/2014       ??? Overactive bladder- related to MS   ??? Multiple sclerosis (HCC)- dx ~2000; [facility] at [facility];  Has been under good control of late.   ???  Fibromyalgia- dx by [facility]    ??? OSA (obstructive sleep apnea)- has CPAP, but not using.   ??? Hyperlipidemia- on lova 40.  No hx of CAD, TIA, CVA or PVD.  Denies side effects.  Lab Results   Component Value Date    CHOL 213 (H) 07/16/2014     Lab Results   Component Value Date    TRIG 113 07/16/2014     Lab Results   Component Value Date    HDL 58 07/16/2014     Lab Results   Component Value Date    LDLCALC 132 (H) 07/16/2014       ??? 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 S/P colonoscopic polypectomyx5; 06/16/15; [facility]      Current Outpatient Prescriptions   Medication Sig Dispense Refill   ??? lisinopril-hydrochlorothiazide (PRINZIDE;ZESTORETIC) 20-25 MG  per tablet TAKE ONE TABLET BY MOUTH DAILY 30 tablet 0   ??? metFORMIN (GLUCOPHAGE) 1000 MG tablet TAKE ONE TABLET BY MOUTH TWICE A DAY WITH MEALS 60 tablet 0   ??? baclofen (LIORESAL) 10 MG tablet TAKE ONE TABLET BY MOUTH THREE TIMES A DAY 90 tablet 3   ??? lovastatin (MEVACOR) 40 MG tablet TAKE ONE TABLET BY MOUTH DAILY 30 tablet 3   ??? PROVENTIL HFA 108 (90 BASE) MCG/ACT inhaler INHALE TWO PUFFS BY MOUTH EVERY 6 HOURS AS NEEDED FOR WHEEZING OR FOR SHORTNESS OF BREATH 1 Inhaler 0   ??? glucose blood VI test strips (ACCU-CHEK AVIVA) strip 1 each by In Vitro route daily E11.9  Accucheck Nano 100 each 0   ??? LORazepam (ATIVAN) 0.5 MG tablet Take 1 tab by mouth an hour prior to MRI scan.  May repeat dose x1. 2 tablet 0   ??? gabapentin (NEURONTIN) 300 MG capsule Take 300 mg by mouth 3 times daily     ??? oxybutynin (DITROPAN-XL) 10 MG CR tablet Take 10 mg by mouth nightly      ??? Cholecalciferol (VITAMIN D3) 50000 UNITS CAPS Take by mouth once a week     ??? ONE TOUCH LANCETS MISC 1 each by Does not apply route daily 100 each 3   ??? glucose blood VI test strips (ONE TOUCH ULTRA TEST) STRP Use once daily and as needed. 100 strip 5   ??? pravastatin (PRAVACHOL) 40 MG TABS Take 40 mg by mouth every  evening 30 tablet 5     No current facility-administered medications for this visit.       Immunization History   Administered Date(s) Administered   ??? Pneumococcal Polysaccharide (Pneumovax23) 09/25/2014        Family History   Problem Relation Age of Onset   ??? Cancer Mother 2     brain   ??? Cancer Father 66     lung cancer     Social History     Social History   ??? Marital status: Single     Spouse name: N/A   ??? Number of children: 2   ??? Years of education: N/A     Occupational History   ??? disabled- due to MS; was OR clerk at [employer]      Social History Main Topics   ??? Smoking status: Never Smoker   ??? Smokeless tobacco: Never Used   ??? Alcohol use Yes      Comment: occ mixed drinks.   ??? Drug use: No   ??? Sexual activity: Not Currently     Partners: Male     Other Topics Concern   ??? Not on file     Social History Narrative    Lives with daugher, [facility]     Health Maintenance   Topic Date Due   ??? HEPATITIS  C SCREENING  December 01, 1956   ??? TETANUS VACCINE ADULT (11 YEARS AND UP)  12/13/1967   ??? HIV SCREENING  12/13/1971   ??? BREAST CANCER SCREENING  12/12/1986   ??? HEMOGLOBIN A1C  01/16/2015   ??? FOOT EXAM  07/16/2015   ??? MICROALBUMINURIA  07/17/2015   ??? LIPIDS  07/17/2015   ??? FLU VACCINE YEARLY (ADULT)  07/28/2015   ??? OPHTHALMOLOGY EXAM  09/25/2015   ??? COLON CANCER SCREENING COLONOSCOPY  06/15/2020   ??? PNEUMOVAX 1 DOSE 19-64Y (2) 12/12/2021         Review of Systems As above     Objective:  Physical Exam   Constitutional: She is oriented to person, place, and time. She appears well-developed and well-nourished.   Neck: Normal range of motion. Neck supple. Carotid bruit is not present. No thyromegaly present.   Cardiovascular: Normal rate, regular rhythm, normal heart sounds and intact distal pulses.    No murmur heard.  Pulmonary/Chest: Effort normal and breath sounds normal. No respiratory distress. She has no wheezes. She has no rales.   Musculoskeletal: Normal range of motion. She exhibits no edema.   Feet normal: no  lesions.  Sensation intact to SW monofilament bilaterally.    Neurological: She is alert and oriented to person, place, and time.   Skin: Skin is warm and dry.   Psychiatric: She has a normal mood and affect. Her behavior is normal. Judgment and thought content normal.   Nursing note and vitals reviewed.      Assessment:      1. Pure hypercholesterolemia  - stable on medium intensity statin (lova 40) due to no hx CVD.  Due now for lipid testing.  - Lipid Panel; Future    2. Essential hypertension  - Blood pressure is at goal on current therapy as above; continue meds and monitoring. Regular physical activity and healthy diet (low sodium, multiple servings of produce daily) was recommended.  Renal function to be assessed yearly.   - Comprehensive Metabolic Panel; Future    3. Type 2 diabetes mellitus with mild nonproliferative retinopathy without macular edema, without long-term current use of insulin (HCC)  - discussed importance of recording sugars on log sheet and provided one for her use.  Test once daily either fasting or postprandial.  Reported glucoses suggest adequate control.  asses control with a1c/micral. Encouraged compliance with diet, exercise, yearly eye exams for retinopathy, daily foot care/observation.   - Hemoglobin A1C; Future  - Microalbumin / Creatinine Urine Ratio; Future  - HM DIABETES FOOT EXAM    4. Encounter for screening mammogram for breast cancer  - MAM Digital Screen Bilateral; Future         Plan:      F/u 6 months, sooner if DM not controlled.

## 2015-08-01 MED ORDER — LISINOPRIL-HYDROCHLOROTHIAZIDE 20-25 MG PO TABS
20-25 MG | ORAL_TABLET | ORAL | 5 refills | Status: DC
Start: 2015-08-01 — End: 2016-02-09

## 2015-08-01 MED ORDER — METFORMIN HCL 1000 MG PO TABS
1000 MG | ORAL_TABLET | ORAL | 5 refills | Status: DC
Start: 2015-08-01 — End: 2016-02-09

## 2015-08-05 ENCOUNTER — Ambulatory Visit: Admit: 2015-08-05 | Discharge: 2015-08-05

## 2015-08-08 NOTE — Other (Unsigned)
CURRENT STATUS IS EMERGENCY .      :     Any questions regarding PATIENT CLASS/STATUS should be directed to the Care   Management Departments: [facility] [phone] or [facility] [phone] or [facility]  [phone].         _________________________________  Signed byJamison Neighbor:    [facility]  NOTIFY    ZZ    D: 08/08/2015 05:56 AM  T:    This document is confidential medical information.  Unauthorized disclosure or   use of this information is prohibited by law.  If you are not the intended recipient of this document, please advise us by   calling immediately [phone].

## 2015-08-11 NOTE — Unmapped (Signed)
Spoke with patient. Patient requesting to speak with MA or MD. Lost hearing L ear Thursday. States it is not a complete lost, everything is muffled, but can not hear what is being said. Feels the ear drum is damage. Nothing has happened to cause harm to the ear. States ER had cleared the wax out, but it did not resolve her problem.

## 2015-08-11 NOTE — Unmapped (Signed)
Patient will contact her PCP-for referral to ENT-FYI

## 2015-08-12 NOTE — Unmapped (Addendum)
Spoke with patient MRI schedule for 09/04/15-Patient will schedule to see ENT-FYI

## 2015-08-12 NOTE — Unmapped (Signed)
Although not common symptom but hearing loss could be from MS. She needs to schedule the MRI of head and spine ordered by [facility] during last visit and come to see [place] after the MRI. I am also referring her to ENT for evaluation of hearing loss.

## 2015-08-18 ENCOUNTER — Ambulatory Visit: Admit: 2015-08-18 | Discharge: 2015-08-18 | Payer: MEDICARE | Attending: Otolaryngology

## 2015-08-18 DIAGNOSIS — H6123 Impacted cerumen, bilateral: Secondary | ICD-10-CM

## 2015-08-18 NOTE — Progress Notes (Signed)
Pt with concern about ear fullness    Pt with no h/o ear surgery, with some plugging, without PETS or otorrhea.      Concern about cerumen impaction    EXAM: unable to observew TMs due to cerumen impaction    PROCEDURE:    Pt is taken to the procedure room, and under the operating microscope, cerumen is removed from the right and left EAC, no other complications, using forcepts, suction, and spoon.    Ret prn.    Rush Barer

## 2015-09-04 ENCOUNTER — Inpatient Hospital Stay: Admit: 2015-09-04 | Payer: MEDICARE

## 2015-09-04 ENCOUNTER — Ambulatory Visit: Payer: MEDICARE

## 2015-09-04 DIAGNOSIS — G35 Multiple sclerosis: Secondary | ICD-10-CM

## 2015-09-04 MED ORDER — GADAVIST (gadobutrol) Soln 10 mL
1 | Freq: Once | INTRAVENOUS | Status: AC | PRN
Start: 2015-09-04 — End: 2015-09-04
  Administered 2015-09-04: 15:00:00 10 mL/kg via INTRAVENOUS

## 2015-09-04 MED FILL — GADAVIST 1 MMOL/ML (604.72 MG/ML) INTRAVENOUS SOLUTION: 1 1 mmol/mL (604.72 mg/mL) | INTRAVENOUS | Qty: 10

## 2015-09-14 NOTE — Telephone Encounter (Signed)
Patient is due for follow up.  Please call to schedule.  One month of med refills will be provided to allow time for this.

## 2015-09-15 MED ORDER — BACLOFEN 10 MG PO TABS
10 MG | ORAL_TABLET | ORAL | 0 refills | Status: DC
Start: 2015-09-15 — End: 2015-10-12

## 2015-09-15 NOTE — Telephone Encounter (Signed)
appt made.

## 2015-09-16 ENCOUNTER — Encounter

## 2015-09-25 NOTE — Telephone Encounter (Signed)
Error

## 2015-09-26 MED ORDER — PROVENTIL HFA 108 (90 BASE) MCG/ACT IN AERS
108 (90 Base) MCG/ACT | RESPIRATORY_TRACT | 0 refills | Status: DC
Start: 2015-09-26 — End: 2018-01-11

## 2015-09-29 NOTE — Telephone Encounter (Signed)
Called pharmacy and pt did not need a PA. They just wanted to make sure it was ok to dispense the Proventil to the patient.

## 2015-09-29 NOTE — Telephone Encounter (Signed)
Pt says her insurance will not cover the albuterol unless we do a PA.

## 2015-09-30 ENCOUNTER — Encounter: Attending: Family Medicine

## 2015-10-10 MED ORDER — LOVASTATIN 40 MG PO TABS
40 MG | ORAL_TABLET | ORAL | 0 refills | Status: DC
Start: 2015-10-10 — End: 2015-11-05

## 2015-10-10 NOTE — Telephone Encounter (Signed)
Medication refilled earlier today. Was there an issue with the pharmacy?

## 2015-10-13 MED ORDER — BACLOFEN 10 MG PO TABS
10 MG | ORAL_TABLET | ORAL | 1 refills | Status: DC
Start: 2015-10-13 — End: 2015-12-10

## 2015-10-21 ENCOUNTER — Ambulatory Visit: Admit: 2015-10-21 | Discharge: 2015-10-21 | Payer: MEDICARE | Attending: Family Medicine

## 2015-10-21 DIAGNOSIS — E113299 Type 2 diabetes mellitus with mild nonproliferative diabetic retinopathy without macular edema, unspecified eye: Secondary | ICD-10-CM

## 2015-10-21 MED ORDER — ACCU-CHEK NANO SMARTVIEW W/DEVICE KIT
PACK | 0 refills | Status: AC
Start: 2015-10-21 — End: ?

## 2015-10-21 NOTE — Progress Notes (Signed)
Subjective:      Patient ID: Danielle Miles is a 59 y.o. female.    Blood pressure 130/80, pulse 90, temperature 98 ??F (36.7 ??C), temperature source Oral, resp. rate 20, height  (1.626 m), weight 217 lb (98.4 kg), not currently breastfeeding.     HPI here for routine check up.  She had not gone to have labs drawn after her July appt.  So will go today.    She goes to [facility] neurology for MS.  Had recent MRI's of brain/head/thoracic spine showing only stable disease burden:    IMPRESSION:  Brain:  1. Stable moderate plaque burden within the periventricular and subcortical white matter compatible with the history of MS. No new or active lesions.    Cervical Spine:  1. No cord signal abnormality or enhancing lesions.    Thoracic spine:  1.????No definite cord signal abnormality or enhancing lesions. Motion degraded examination limits cord evaluation.    Approved by Darryll Capers, MD on 09/04/2015 2:18 PM EDT        Vitals:    10/21/15 1509   BP: 130/80   Pulse: 90   Resp: 20   Temp: 98 ??F (36.7 ??C)     Body mass index is 37.25 kg/(m^2).    Wt Readings from Last 3 Encounters:   10/21/15 217 lb (98.4 kg)   08/18/15 219 lb 3.2 oz (99.4 kg)   07/25/15 222 lb (100.7 kg)      BP Readings from Last 3 Encounters:   10/21/15 130/80   08/18/15 121/75   07/25/15 126/72      Patient Active Problem List   Diagnosis   ??? HTN (hypertension)- on prinzide still.  She denies cough.    Has normal renal function   Lab Results   Component Value Date    NA 144 07/16/2014    K 4.0 07/16/2014    BUN 9 07/16/2014    CREATININE 0.6 07/16/2014         ??? Type 2 diabetes mellitus (HCC) (dx 2013)- on metformin alone.    Has not had eye exam yet.  She wsa without a car recently and is 'back logged' on all her appointments.  Usually goes to [facility].  Denies vision problems.  She does test her sugars at home sporadically- not recently though because her 'battery ran out.'    She has lost weight since last visit, about 5 lbs.  She exercises every day by  doing line dancing.  Sometimes walks.  Has not really altered diet.    Lab Results   Component Value Date    LABA1C 7.2 07/16/2014     Lab Results   Component Value Date    EAG 159.9 07/16/2014       ??? Overactive bladder- related to MS   ??? Multiple sclerosis (HCC)- dx ~2000; Dr Jill Alexanders at [facility]   ??? Fibromyalgia- dx by DR Fran Lowes    ??? OSA (obstructive sleep apnea)- has CPAP, but not using.   ??? Hyperlipidemia   ??? 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 S/P colonoscopic polypectomyx5; 06/16/15; DR Mathis Dad      Current Outpatient Prescriptions   Medication Sig Dispense Refill   ??? baclofen (LIORESAL) 10 MG tablet TAKE ONE TABLET BY MOUTH THREE TIMES A DAY 90 tablet 1   ??? lovastatin (MEVACOR) 40 MG tablet TAKE ONE TABLET BY MOUTH DAILY 30 tablet 0   ???  PROVENTIL HFA 108 (90 BASE) MCG/ACT inhaler INHALE TWO PUFFS BY MOUTH EVERY 6 HOURS AS NEEDED FOR WHEEZING OR FOR SHORTNESS OF BREATH 1 Inhaler 0   ??? metFORMIN (GLUCOPHAGE) 1000 MG tablet TAKE ONE TABLET BY MOUTH TWICE A DAY WITH MEALS 60 tablet 5   ??? lisinopril-hydrochlorothiazide (PRINZIDE;ZESTORETIC) 20-25 MG per tablet TAKE ONE TABLET BY MOUTH DAILY 30 tablet 5   ??? glucose blood VI test strips (ACCU-CHEK AVIVA) strip 1 each by In Vitro route daily E11.9  Accucheck Nano 100 each 0   ??? LORazepam (ATIVAN) 0.5 MG tablet Take 1 tab by mouth an hour prior to MRI scan.  May repeat dose x1. 2 tablet 0   ??? gabapentin (NEURONTIN) 300 MG capsule Take 300 mg by mouth 3 times daily     ??? oxybutynin (DITROPAN-XL) 10 MG CR tablet Take 10 mg by mouth nightly      ??? Cholecalciferol (VITAMIN D3) 50000 UNITS CAPS Take by mouth once a week     ??? ONE TOUCH LANCETS MISC 1 each by Does not apply route daily 100 each 3   ??? glucose blood VI test strips (ONE TOUCH ULTRA TEST) STRP Use once daily and as needed. 100 strip 5   ??? pravastatin (PRAVACHOL) 40 MG TABS Take 40 mg by mouth every evening 30 tablet 5     No  current facility-administered medications for this visit.       Immunization History   Administered Date(s) Administered   ??? Pneumococcal Polysaccharide (Pneumovax23) 09/25/2014        Family History   Problem Relation Age of Onset   ??? Cancer Mother 46     brain   ??? Cancer Father 80     lung cancer     Social History     Social History   ??? Marital status: Single     Spouse name: N/A   ??? Number of children: 2   ??? Years of education: N/A     Occupational History   ??? disabled- due to MS; was OR clerk at [employer]      Social History Main Topics   ??? Smoking status: Never Smoker   ??? Smokeless tobacco: Never Used   ??? Alcohol use Yes      Comment: occ mixed drinks.   ??? Drug use: No   ??? Sexual activity: Not Currently     Partners: Male     Other Topics Concern   ??? Not on file     Social History Narrative    Lives with daugher, Merlinda Frederick     Health Maintenance   Topic Date Due   ??? Hepatitis C screen  09-30-1956   ??? HIV screen  12/13/1971   ??? DTaP/Tdap/Td vaccine (1 - Tdap) 12/13/1975   ??? Breast cancer screen  12/12/2006   ??? Diabetic hemoglobin A1C test  01/16/2015   ??? Diabetic microalbuminuria test  07/17/2015   ??? Lipid screen  07/17/2015   ??? Flu vaccine (1) 07/28/2015   ??? Diabetic retinal exam  09/25/2015   ??? Diabetic foot exam  07/24/2016   ??? Colon cancer screen colonoscopy  06/15/2020   ??? PNEUMOVAX 1 DOSE 19-64Y (2) 12/12/2021         Review of Systems No chest pains, dizziness, heart palpitations, dyspnea, lightheadedness, worsening edema.     Objective:   Physical Exam   Constitutional: She is oriented to person, place, and time. She appears well-developed and well-nourished.   Neck: Normal  range of motion. Neck supple. Carotid bruit is not present. No thyromegaly present.   Cardiovascular: Normal rate, regular rhythm, normal heart sounds and intact distal pulses.    No murmur heard.  Pulmonary/Chest: Effort normal and breath sounds normal. No respiratory distress. She has no wheezes. She has no rales.   Musculoskeletal:  Normal range of motion. She exhibits no edema.   Neurological: She is alert and oriented to person, place, and time.   Skin: Skin is warm and dry.   Psychiatric: She has a normal mood and affect. Her behavior is normal. Judgment and thought content normal.   Nursing note and vitals reviewed.      Assessment:      1. Type 2 diabetes mellitus with mild nonproliferative retinopathy without macular edema, without long-term current use of insulin, unspecified laterality  - control unknown.  asses control with a1c/micral. Encouraged compliance with diet, exercise, yearly eye exams for retinopathy, daily foot care/observation. (reprinted orders)  Reordered her glucometer and asked her to test once daily at various times.    2. Multiple sclerosis (HCC)- dx ~2000; [facility]  - seems stable.  Follow with [facility].      3. Pure hypercholesterolemia  - reprinted order for lipids    4. Essential hypertension  - Blood pressure is at goal on current therapy; continue meds and monitoring. Regular physical activity and healthy diet (low sodium, multiple servings of produce daily) was recommended.  Renal function to be assessed yearly. Reprinted order for CMP    5. Vit D deficiency  - retest vit D level.  If low, will reorder the once weekly form.      Plan:      F/u 3 months.      She declines flu vaccine.

## 2015-10-24 ENCOUNTER — Encounter

## 2015-10-24 LAB — COMPREHENSIVE METABOLIC PANEL
ALT: 44 U/L — ABNORMAL HIGH (ref 10–40)
AST: 35 U/L (ref 15–37)
Albumin/Globulin Ratio: 1.3 (ref 1.1–2.2)
Albumin: 4.3 g/dL (ref 3.4–5.0)
Alkaline Phosphatase: 67 U/L (ref 40–129)
Anion Gap: 15 (ref 3–16)
BUN: 12 mg/dL (ref 7–20)
CO2: 29 mmol/L (ref 21–32)
Calcium: 9.5 mg/dL (ref 8.3–10.6)
Chloride: 97 mmol/L — ABNORMAL LOW (ref 99–110)
Creatinine: 0.7 mg/dL (ref 0.6–1.1)
GFR African American: >60 (ref >60)
GFR Non-African American: >60 (ref >60)
Globulin: 3.3 g/dL
Glucose: 120 mg/dL — ABNORMAL HIGH (ref 70–99)
Potassium: 4 mmol/L (ref 3.5–5.1)
Sodium: 141 mmol/L (ref 136–145)
Total Bilirubin: <0.2 mg/dL (ref 0.0–1.0)
Total Protein: 7.6 g/dL (ref 6.4–8.2)

## 2015-10-24 LAB — LIPID PANEL
Cholesterol, Total: 149 mg/dL (ref 0–199)
HDL: 64 mg/dL — ABNORMAL HIGH (ref 40–60)
LDL Calculated: 74 mg/dL (ref <100)
Triglycerides: 56 mg/dL (ref 0–150)
VLDL Cholesterol Calculated: 11 mg/dL

## 2015-10-24 LAB — MICROALBUMIN / CREATININE URINE RATIO
Creatinine, Ur: 177.7 mg/dL (ref 28.0–259.0)
Microalbumin Creatinine Ratio: 26.4 mg/g (ref 0.0–30.0)
Microalbumin, Random Urine: 4.7 mg/dL — ABNORMAL HIGH (ref <2.0)

## 2015-10-24 LAB — VITAMIN D 25 HYDROXY: Vit D, 25-Hydroxy: 55.5 ng/mL (ref >=30)

## 2015-10-25 LAB — HEMOGLOBIN A1C
Hemoglobin A1C: 6.7 %
eAG: 145.6 mg/dL

## 2015-10-29 MED ORDER — interferon beta-1a, albumin, (REBIF, WITH ALBUMIN,) 44 mcg/0.5 mL injection
44 | SUBCUTANEOUS | 3.00 refills | 28.00000 days | Status: AC
Start: 2015-10-29 — End: 2016-11-05

## 2015-11-03 NOTE — Unmapped (Signed)
Pt called, states [facility] told her the script request for interferon beta-1a, albumin, (REBIF, WITH ALBUMIN,) 44 mcg/0.5 mL injection was denied. Pt states she get pre-filled syringed rebif, pls advise when complete.

## 2015-11-03 NOTE — Unmapped (Signed)
Rebif verbally called into pharmacy-complete

## 2015-11-05 MED ORDER — LOVASTATIN 40 MG PO TABS
40 MG | ORAL_TABLET | ORAL | 5 refills | Status: DC
Start: 2015-11-05 — End: 2016-05-10

## 2015-11-17 ENCOUNTER — Encounter

## 2015-11-28 ENCOUNTER — Ambulatory Visit: Admit: 2015-11-28

## 2015-11-28 ENCOUNTER — Encounter: Admit: 2015-11-28

## 2015-12-02 ENCOUNTER — Ambulatory Visit: Admit: 2015-12-02 | Discharge: 2015-12-02

## 2015-12-10 MED ORDER — BACLOFEN 10 MG PO TABS
10 MG | ORAL_TABLET | ORAL | 5 refills | Status: DC
Start: 2015-12-10 — End: 2016-11-11

## 2015-12-18 ENCOUNTER — Ambulatory Visit: Admit: 2015-12-18

## 2015-12-18 NOTE — Unmapped (Addendum)
Patient requesting a new handicap placard. States she will pick the script up. Asked if she could have more than one script? Has 2 cars.  Current is expired

## 2015-12-18 NOTE — Unmapped (Signed)
Patient will pick up the handicap placard tomorrow-complete

## 2015-12-30 NOTE — Unmapped (Signed)
Patient will pick up tomorrow.

## 2015-12-30 NOTE — Unmapped (Deleted)
Spoke with patient. Will pick up tomorrow.

## 2015-12-30 NOTE — Unmapped (Signed)
Patient advised ready for pick up-complete

## 2016-01-13 ENCOUNTER — Ambulatory Visit: Admit: 2016-01-13 | Discharge: 2016-01-13

## 2016-01-13 ENCOUNTER — Ambulatory Visit: Admit: 2016-01-13 | Payer: MEDICARE

## 2016-01-13 ENCOUNTER — Ambulatory Visit: Admit: 2016-01-13 | Payer: MEDICARE | Attending: Neurology

## 2016-01-13 DIAGNOSIS — G35 Multiple sclerosis: Secondary | ICD-10-CM

## 2016-01-13 LAB — DIFFERENTIAL
Basophils Absolute: 41 /uL (ref 0–200)
Basophils Relative: 0.5 % (ref 0.0–1.0)
Eosinophils Absolute: 130 /uL (ref 15–500)
Eosinophils Relative: 1.6 % (ref 0.0–8.0)
Lymphocytes Absolute: 2195 /uL (ref 850–3900)
Lymphocytes Relative: 27.1 % (ref 15.0–45.0)
Monocytes Absolute: 689 /uL (ref 200–950)
Monocytes Relative: 8.5 % (ref 0.0–12.0)
Neutrophils Absolute: 5046 /uL (ref 1500–7800)
Neutrophils Relative: 62.3 % (ref 40.0–80.0)
nRBC: 0 /100{WBCs} (ref 0–0)

## 2016-01-13 LAB — VITAMIN D 25 HYDROXY: Vit D, 25-Hydroxy: 36.9 ng/mL (ref 30.0–100)

## 2016-01-13 LAB — CBC
Hematocrit: 43 % (ref 35.0–45.0)
Hemoglobin: 14.3 g/dL (ref 11.7–15.5)
MCH: 30.4 pg (ref 27.0–33.0)
MCHC: 33.1 g/dL (ref 32.0–36.0)
MCV: 91.7 fL (ref 80.0–100.0)
MPV: 9.9 fL (ref 7.5–11.5)
Platelets: 219 10*3/uL (ref 140–400)
RBC: 4.69 10*6/uL (ref 3.80–5.10)
RDW: 13.1 % (ref 11.0–15.0)
WBC: 8.1 10*3/uL (ref 3.8–10.8)

## 2016-01-13 LAB — URINALYSIS, REFLEX TO CULTURE
Bilirubin, UA: NEGATIVE
Blood, UA: NEGATIVE
Glucose, UA: NEGATIVE mg/dL
Ketones, UA: NEGATIVE mg/dL
Nitrite, UA: NEGATIVE
Protein, UA: NEGATIVE mg/dL
RBC, UA: 4 /HPF (ref 0–3)
Specific Gravity, UA: 1.021 (ref 1.005–1.035)
Squam Epithel, UA: <1 /HPF (ref 0–5)
Trans Epithel, UA: 1 /HPF (ref 0–5)
Urobilinogen, UA: <2 mg/dL (ref 0.2–1.9)
WBC, UA: 45 /HPF (ref 0–5)
pH, UA: 5 (ref 5.0–8.0)

## 2016-01-13 LAB — COMPREHENSIVE METABOLIC PANEL, SERUM
ALT: 57 U/L (ref 7–52)
AST (SGOT): 46 U/L (ref 13–39)
Albumin: 4.4 g/dL (ref 3.5–5.7)
Alkaline Phosphatase: 62 U/L (ref 36–125)
Anion Gap: 8 mmol/L (ref 3–16)
BUN: 15 mg/dL (ref 7–25)
CO2: 33 mmol/L (ref 21–33)
Calcium: 10.3 mg/dL (ref 8.6–10.3)
Chloride: 97 mmol/L (ref 98–110)
Creatinine: 0.62 mg/dL (ref 0.60–1.30)
Glucose: 149 mg/dL (ref 70–100)
Osmolality, Calculated: 290 mOsm/kg (ref 278–305)
Potassium: 3.6 mmol/L (ref 3.5–5.3)
Sodium: 138 mmol/L (ref 133–146)
Total Bilirubin: 0.4 mg/dL (ref 0.0–1.5)
Total Protein: 8 g/dL (ref 6.4–8.9)
eGFR AA CKD-EPI: >90 See note.
eGFR NONAA CKD-EPI: >90 See note.

## 2016-01-13 LAB — URINE CULTURE

## 2016-01-13 MED ORDER — cholecalciferol, vitamin D3, 50,000 unit capsule
1250 | ORAL_CAPSULE | ORAL | Status: AC
Start: 2016-01-13 — End: 2016-11-11

## 2016-01-13 NOTE — Unmapped (Signed)
Date Symptom   Onset/Outcome Brain MRI Spine MRI Tests DMT period A/E Meds   2000 Fatigue, cognitive G/P        2004 Blurry vision B/L G/I  Dx MS    Rebif IVMP   2013  Stable 04-now  PV -ve      03/2014  09/2014 S 3/15 R leg W after cold    4/15 S       12/2014  9/16 S       12/2015                           Onset: Acute/Gradual , Outcome: Resolved/Improved/Progressed, MRI: Peri Ventricular/Juxta Cortical,      HPI:      01/13/16:  She denies any new symptoms or exacerbation except worsening of fatigue after stopping high dose vitamin D. Her family doctor recommended taking OTC but that does not help.     07/15/15 Sandy:  Ms Melanie [name] is here for evaluation of worsening symptoms.  She reports that she has noticed that she has increased trouble walking when she gets up in the morning, she feels like she is a little of balance.  Once she has walked to the bathroom she is back to baseline.  She has also noticed that she has some pain in her right leg from halfway down her thigh to her calf which occurs every now and then for about 1 month. This has been going on for about 3 weeks, she otherwise has no other new symptoms.    01/08/15:  She thinks overall is stable, denies any new symptom or exacerbation. She received a steroid shot for knee pain which helped.      10/01/14:  I had the pleasure of evaluating Ms Motsinger who is here for evaluation of new symptoms.  She is on Rebif since 2004 and take is regulalry with no missed doses.  She developed new pain in her left knee and left leg, She also noticed increased numbness in that extremity which lasted about 3 days and it has started to improve.    04/09/14 Dr. Genelle Gather  On 4/2, she woke up and couldn't walk, felt her R leg was weak. Also lost sensation with tingling. She waited to see if this would get better. She called on 4/6 and Dr. Genelle Gather thought she is probably having exacerbation. Labs were done with CBC, BMP, UA which did not show evidence of infection. Nurse visited her at  home to get IV access. IV solumedrol was started on 4/12. She got the second dose today. Since about Friday last week, sensation was coming back. Weakness is improved but not back to baseline. She is able to walk. Dancing is still hard. She is still on Rebif, doing ok with the injection. This is a first real exacerbation since 2004.   Recent exacerbation happened after recent URI.    02/27/13  She was working as OR Haematologist at [employer]. She was feeling very tired with some cognitive and memory problem around 2000. Thne she had an episode of visual change in both eye in 2004 with partial recovery. She was Dx with MS based on MRI. She was started on Rebif . She thinks it works and tolerating it well however does not like injection. Her last MRI was from April 2013.   Last year she lost 25 Lbs by diet and exercise after she was diagnosed with Diabetes but she regain that weight. She  is planning to go back in diet this week.     I reviewed Dr. 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 Pt is currently on Rebif and is tolerating it well. She has not had an MRI since January 2012. Since pts last visit she feels her condition is getting worse. She is experiencing cognitive issues including concentration and memory issues. She has also started to urinate uncontrollably and has missed work as a result. Pt is having trouble walking due to pain in both knees and L hip pain. She also complains of stress at work which causes her to be depressed.   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 Pt is currently on Rebif and is tolerating it well. She has not had an MRI since January 2012. Since pts last visit she feels her condition is getting worse. She is experiencing cognitive issues including concentration and memory issues. She has also started to urinate uncontrollably and has missed  work as a result. Pt is having trouble walking due to pain in both knees and L hip pain. She also complains of stress at work which causes her to be depressed.   Due to pts complaints of cognitive issues, we will refer her for a neuropsych eval. We will also give her the contact number to the social worker for the [facility]. I would like to obtain and MRI of the brain and c spine.      Review of System:      Weakness:  BLE when she first gets up  Pain:     R. Knee and hip, Steroid shot and Gabapentin   Sensory  Numbness r. foot  Fatigue:  bothersome  Sleep:        4 to 5 hrs uninterrupted,   Sleep Apnea:  No longer uses CPAP    STM   Main symptom she get disability   Urinary Incontinece Oxybutinin helps, she has done pelvic exercise, this was one the reason she stopped working   Walking Difficulty Tired and back pain, can do more after exercise   Imbalance  Trip over but no fall   Fatigue                        Exhausted    Life Style:      Living Situation: With daughters family  Diet:  Diet  Exercise: Line Dance as exercise 4 days a week   Vitamin D: None    Medications:      Current Outpatient Prescriptions   Medication Sig   ??? albuterol Inhale 2 puffs into the lungs. Every 4 to 6 hours as needed.    ??? baclofen TAKE ONE TABLET BY MOUTH THREE TIMES A DAY   ??? blood sugar diagnostic Test blood sugars up to twice a day   ??? gabapentin Take 1 capsule (300 mg total) by mouth 3 times a day. Indications: Neuropathic Pain   ??? ibuprofen Take 1 tablet (800 mg total) by mouth every 8 hours as needed.   ??? inhalational spacing device by Misc.(Non-Drug; Combo Route) route. AEROCHAMBER PLUS.  Use with MDI    ??? interferon beta-1a (albumin) Inject 0.5 mLs (44 mcg total) subcutaneously every Monday, Wednesday, and Friday.   ??? lisinopril-hydrochlorothiazide Take 1 tablet by mouth daily.   ???  LORazepam 15 min before MRI, can be repeated after 15 min.   ??? lovastatin Take 40 mg by mouth daily with dinner.   ??? metFORMIN Take 1 tablet (500 mg  total) by mouth 2 times a day with meals.   ??? oxybutynin TAKE TWO TABLETS BY MOUTH DAILY     No current facility-administered medications for this visit.      Allergies: Compazine and Prochlorperazine    History:      PMHx:  has a past medical history of Asthma; Multiple sclerosis; Hypertension; and Diabetes mellitus.     PSHx:  has past surgical history that includes Knee surgery; Appendectomy; and Hysterectomy.    Social Hx: reports that she has never smoked. She has never used smokeless tobacco. She reports that she drinks alcohol. She reports that she does not use illicit drugs. She lives by herself. She feels she is safe but has to take extra time. Feels depressed being lonely. Grandkids lives cross the street and come and visit her. She goes to [facility] [employer], do dance club and try to stay active.     QIO:NGEXBM history includes Alcohol abuse in her father and other; Brain cancer in her other; COPD in her father; Cancer in her father, maternal grandmother, mother, and sister; Diabetes in her daughter, paternal aunt, paternal grandfather, and paternal uncle; Skin cancer in her other; Stomach cancer in her other; Throat cancer in her other.    Physical Exam:    BP 109/83 mmHg   Pulse 89   Ht 5' 4 (1.626 m)   Wt 220 lb (99.791 kg)   BMI 37.74 kg/m2   Here is my exam from previous visits. I repeated some of these exams and updated the changes.   Mental Status:Patient registered 3 objects without difficulty and recalled 3/3 after five minutes.  The patient was fully oriented and had a normal fund of knowledge.  Cranial Nerves: Color Vision12/12 OD and 10/12 OS, best corrected visual acuity was 20/20 OD and 20/20 OS.  There was no afferent pupillary deficit.  Funduscopic exam revealed normal optic nerve heads.   Horizontal and vertical eye movements were full.  There was no nystagmus.  Facial sensation was intact to pinprick.  There was no facial weakness.  Hearing was intact to finger rubbing bilaterally. Palate moved fully  and symmetrically.  Tongue protruded in the midline.  There was no dysarthria.  Motor:   Strength:R-L (MRC Scale)   Deltoid  5 - 5   Hip Fx   4+ - 5 (R. Hip limited by pain)  Biceps   5 - 5   Knee Ex  5 - 5   Triceps  5 - 5   Knee Fx  5 - 5   Wrist Ex  5 - 5   Plantar Fx 5 - 5   Grip         5 - 5   Ankle dorsiflex 5 - 5  Tone was normal  Reflexes: R / L (MRC Scale)   Biceps  2 / 2   Quadriceps 2 / 2   Triceps  2 / 2   Achilles  2 / 2   Brachoradialis 2 / 2   Toes   Up / down   Coordination: There was no ataxia on finger to nose or heel to shin testing.  Sensation: Decreased vibration BLE.     Position and pinprick sensation were intact in all four extremities.  Gait and station:  Unable to do  tandem walk and hopping on either foot.   T25-FW date Time assistance   02/26/13  6 No   04/09/14 7.5 No   10/01/14 8.26 No  01/08/14 5.4 No   07/15/15 5.8 No  01/13/16 5 No     Tests:      09/04/15 compare to 04/08/14  Brain:  1. Stable moderate plaque burden within the periventricular and subcortical white matter compatible with the history of MS. No new or active lesions.  Cervical Spine:  1. No cord signal abnormality or enhancing lesions.  Thoracic spine:  1.?? No definite cord signal abnormality or enhancing lesions. Motion degraded examination limits cord evaluation.    04/08/14 COMPARISON: MRI dated 03/27/2012.  MRI head:   1. No significant interval change in the moderate burden of T2 signal prolongation involving the periventricular, deep, and central pontine white matter consistent with the clinical diagnosis of multiple sclerosis. No new lesions, restricted diffusion, or enhancement is identified.   2. Small amount of layering fluid signal in the right maxillary sinus.       MRI cervical spine:   1. No evidence of cord signal abnormalities or enhancement.   2. Very mild multilevel degenerative discogenic changes without evidence of central canal or neuroforaminal stenosis, unchanged.       MRI thoracic spine:   1. No evidence of  cord signal abnormalities or enhancement.   2. Very mild degenerative discogenic changes without evidence of central canal or neuroforaminal stenosis, involving the T3-T4 through T7-T8 levels.      Lab Results   Component Value Date    WBC 10.5 07/15/2015    HGB 14.1 07/15/2015    HCT 42.4 07/15/2015    MCV 90.6 07/15/2015    PLT 229 07/15/2015     Lab Results   Component Value Date    CREATININE 0.64 07/15/2015    BUN 19 07/15/2015    NA 136 07/15/2015    K 3.5 07/15/2015    CL 94* 07/15/2015    CO2 33 07/15/2015     Lab Results   Component Value Date    ALT 69* 07/15/2015    AST 60* 04/02/2014    ALKPHOS 60 07/15/2015    BILITOT 0.4 07/15/2015     Lab Results   Component Value Date    VITD25H 25.9* 02/27/2013     Lab Results   Component Value Date    VITAMINB12 322 10/01/2014     Lab Results   Component Value Date    TSH 1.42 10/01/2014     07/16/14 Vit D 25 59.5    Assessment/Plan:       Ms Witzke is a 60 y.o. female with Multiple Sclerosis diagnosed in 2004 treated with Rebif. MS is not active and there is no clear evidence of disease progression.       1. Multiple sclerosis  - Rebif has caused LFT elevation, patient agree with monitoring   - Differential  - CBC  - Comprehensive Metabolic Panel, Serum  - Vitamin D 25 hydroxy  - MRI Head W WO contrast in Sep   - Return after MRI     2. Vitamin D deficiency can be a contributory factor to her fatigue   - cholecalciferol, vitamin D3, 50,000 unit capsule; Take 1 capsule (50,000 Units total) by mouth once a week.  Dispense: 12 capsule; Refill: 3  -  Vitamin D 25 hydroxy  - We also discussed role of sleep and night shift in fatigue  Raeanne Gathers, MD  Assistant Professor of Neurology   Director of [facility]   [address]   [place], [place] [address]????  [phone]

## 2016-01-13 NOTE — Unmapped (Signed)
Blood work today  Restart Vitamin D   MRI in September and return after MRI

## 2016-01-27 NOTE — Unmapped (Signed)
Assuming she is referring to MS/neuropathic pain Ibuprofen is not a good treatment and in long term can damage her Kidney. I would recommend to increase Gabapentin first.

## 2016-01-27 NOTE — Unmapped (Addendum)
Pt called back and informed. Pt asked to speak with [name] about this. Pls advise.

## 2016-01-27 NOTE — Unmapped (Signed)
Left message to patient.  She may schedule an appt to be evaluated by Andrey Campanile today if needed.

## 2016-01-27 NOTE — Unmapped (Signed)
Spoke with patient.  Was seen earlier this month.  Since last night she is experiencing in upper and lower extremities.  Is requesting 800 mg of Iburporfen.

## 2016-01-27 NOTE — Unmapped (Signed)
Spoke with pt.  She is requesting a script for Ibuprofen she states she is pain,  Pls advise

## 2016-01-27 NOTE — Unmapped (Signed)
Patient is currently taking Gabapentin 300 mg tid.  She will increase her evening dose by one tablet.  Will call back on Friday to let you know how she is doing-FYI

## 2016-01-29 ENCOUNTER — Encounter

## 2016-01-30 MED ORDER — ibuprofen (ADVIL,MOTRIN) 800 MG tablet
800 | ORAL_TABLET | Freq: Three times a day (TID) | ORAL | Status: AC | PRN
Start: 2016-01-30 — End: 2017-01-05

## 2016-01-30 NOTE — Unmapped (Signed)
Noted  

## 2016-01-30 NOTE — Unmapped (Signed)
Spoke with patient. Calling back in regards to her request for Ibuprofen as needed. Gabapentin is not helping. No improvements. Taking 300 mg twice daily, 600 mg at HS. States the weather change seems to really cause her body to ache. Also office should receive a request from insurance in regards to a tier reduction for her oxybutynin and Rebif per patient.

## 2016-01-30 NOTE — Unmapped (Signed)
Caller will be faxing tier exception paperwork for Rebif to [phone].

## 2016-01-30 NOTE — Unmapped (Signed)
Gabapentin is still low dose and I recommend to increase to 600 mg TID. If patient insist to take Ibuprofen I can send Rx to pharmacy but she should try to limit the use of Ibuprofen as in long term can damage her stomach and kidney.

## 2016-01-30 NOTE — Unmapped (Signed)
Pt is calling back today to that the two gabapentin is not working. Pt is asking for ibuprofen.

## 2016-02-02 NOTE — Unmapped (Signed)
Patient advised.  She will not increase the Gabapentin and will pick up the Ibuprofen-complete

## 2016-02-02 NOTE — Unmapped (Signed)
Left message for patient to have the form for the tier reduction faxed for the Oxybutin-complete

## 2016-02-02 NOTE — Unmapped (Signed)
Spoke with pt.  Says form was also faxed to NP [place], for oxybutin.  So it could possibly be in her office too.  If Melanie [name] does not see it she would like a call back so she can call the insurance company.

## 2016-02-03 NOTE — Unmapped (Signed)
Left message to patient the the tier exception for the Oxybutin and the Rebif was approved till 02/01/17-please relay message if patient calls office

## 2016-02-03 NOTE — Unmapped (Signed)
Spoke with patient. States information for the Oxybutin was faxed to our office.

## 2016-02-05 NOTE — Other (Unsigned)
[facility] ED Encounter Arrival Date:   02/05/16 2009 [facility]                            DOB: 03-06-1956 [address] [place] [address] MRN: [id number] CSN: [id number]       HAR: [id number]      [facility] - General NOTE      CHIEF COMPLAINT Chief Complaint Patient presents with   Leg Pain      HPI Danielle [facility] is a 60 year old female who presents for evaluation of   right leg pain. Describes an aching discomfort involving her entire right   lower extremity which is been bothering her throughout the day today. States   she did a performance yesterday, a couple of days is, but does not recall any   specific injury, misstep or inciting event. Does not believe she overdid it   very much either. History of multiple sclerosis for which she follows with   neurology. She is on beta interferon, Neurontin and baclofen at baseline. No   acute weakness or paresthesia to either lower extremity. No unusual anterior   abdominal pains, no change in bowel or bladder habits, eating and drinking   normally. No fevers or unusual rashes. No significant lower back pain. She   tried a family member's Robaxin earlier today with no significant improvement.        REVIEW OF SYSTEMS Constitutional:  Denies fever, chills, weight loss or   weakness Eyes:  Denies photophobia or discharge or visual disturbance HENT:    Denies sore throat or ear pain or nasal congestion Respiratory:  Denies cough   or shortness of breath Cardiovascular:  Denies chest pain, palpitations or   swelling Abdominal: Denies abdominal pain, nausea, vomiting, diarrhea.   Musculoskeletal:  As above Skin:  Denies rash Neurologic:  Denies headache,   focal weakness or sensory changes See HPI for further details. All other   review of systems otherwise negative.      PAST MEDICAL HISTORY Past Medical History: Diagnosis Date   Asthma   Multiple   sclerosis (HCC)   Type II or unspecified type diabetes  mellitus without   mention of complication, not stated as uncontrolled   Unspecified essential   hypertension      SURGICAL HISTORY Past Surgical History: Procedure Laterality Date     APPENDECTOMY   HX ECHOCARDIOGRAM   HX MYOVIEW CARDIAC STRESS TESTING     HYSTERECTOMY   KNEE SURGERY      CURRENT MEDICATIONS Prior to Admission medications Medication Sig Start Date   End Date Taking? Authorizing Provider ibuprofen (ADVIL,MOTRIN) 800 MG TABS   Take 800 mg by mouth every 6 (six) hours as needed.    HISTORICAL MED   pravastatin (PRAVACHOL) 40 MG TABS Take 40 mg by mouth daily.    HISTORICAL   MED lisinopril-hydrochlorothiazide 20-25 (ZESTORETIC) combo dose Take  by   mouth daily.    HISTORICAL MED oxybutynin (DITROPAN) 5 MG TABS Take 5 mg by   mouth 2 (two) times daily. HISTORICAL MED albuterol 108 (90 BASE) MCG/ACT AERS   Use 2 puffs every 6 (six) hours as needed. 03/25/14   Gardiner Sleeper,   MD interferon beta-1a (REBIF) 44 MCG/0.5ML SOLN Use 44 mcg 3 (three) times a   week. HISTORICAL MED baclofen (LIORESAL) 10 MG TABS Take 10 mg by mouth 3   (  three) times daily. HISTORICAL MED gabapentin (NEURONTIN) 300 MG CAPS Take   300 mg by mouth 3 (three) times daily. HISTORICAL MED metFORMIN (GLUCOPHAGE)   500 MG TABS Take 500 mg by mouth 2 (two) times daily with meals.    HISTORICAL   MED      ALLERGIES Allergies Allergen Reactions   Compazine [Prochlorperazine] Muscle   Aches   Muscle spasm      FAMILY HISTORY Family History Problem Relation Age of Onset   Diabetes Father     Cancer Brother   Cancer Sister      SOCIAL HISTORY Social History      Social History   Marital status: Divorced   Spouse name: N/A   Number of   children: N/A   Years of education: N/A      Social History Main Topics   Smoking status: Never Smoker   Smokeless   tobacco: None   Alcohol use Yes    Comment: occasional   Drug use: No   Sexual   activity: Not Asked      Other Topics Concern   None      Social History Narrative           PHYSICAL EXAM  VITAL SIGNS: Visit Vitals   BP (!) 130/94   Pulse 97   Temp   97.7  F (36.5  C)   Resp 16   Ht 64" (162.6 cm)   Wt 229 lb (103.9 kg)   SpO2   97%   BMI 39.31 kg/m2      Constitutional:  Well developed, Well nourished, No acute distress, Non-toxic   appearance. HENT:  Normocephalic, Atraumatic, Bilateral external ears normal,   Oropharynx moist, No oral exudates, Nose normal. Neck- Normal range of   motion, No tenderness, Supple, No stridor. Eyes:  PERRL, EOMI, Conjunctiva   normal, No discharge. Respiratory:  Normal breath sounds, No respiratory   distress, No wheezing, No chest tenderness. Cardiovascular:  Normal heart   rate, Normal rhythm, No murmurs, No rubs, No gallops. GI:  Bowel sounds   normal, Soft, No tenderness, No masses, No pulsatile masses. Musculoskeletal:    Intact distal pulses, No edema. Generalized range of motion discomfort about   the right hip and lower extremity without specific bony tenderness or gross   deformity. No specific tenderness in the lumbar region. The foot is warm,   well-perfused, brisk capillary refill with 2+ dorsalis pedis pulse. No   significant calf swelling or tenderness. Integument:  Warm, Dry, No erythema,   No rash. Neurologic:  Alert & oriented x 3, Normal motor function, Normal   sensory function, No focal deficits noted. Psychiatric:  Affect normal,   Judgment normal, Mood normal.      EKG      LABORATORY Recent Results (from the past 24 hour(s)) BMP  Collection Time:   02/05/16  9:28 PM [facility] Value Ref Range  BLD UREA NITROGEN 17 8 - 26 mg/dL    SODIUM 914139 782135 - 956145 mEq/L  POTASSIUM 3.4 (L) 3.6 - 5.1 mEq/L  CHLORIDE 99 (L)   101 - 111 mEq/L  CO2 32 24 - 36 mEq/L  GLUCOSE, RANDOM 122 (H) 70 - 99 mg/dL    CREATININE 2.130.68 0.860.44 - 1.03 mg/dL  OSMOLALITY CALC 578280 469280 - 300 mOSM/kg    CALCIUM 9.4 8.5 - 10.5 mg/dL  GFR NON-AFRICAN AMER >62>60 >60 mL/min/1.73 sq   meter  GFR AFRICAN AMERICAN >60 >60 mL/min/1.73 sq meter  CBC w/ Diff    Collection Time: 02/05/16  9:28 PM [facility]  Value Ref Range  WBC 10.1 3.6 - 10.5   THOU/mcL  RBC 4.63 3.80 - 5.20 MIL/mcL  HEMOGLOBIN 13.6 12.0 - 15.2 g/dL    HEMATOCRIT 42 36 - 46 %  MCV 90 82 - 97 fL  MCH 30 27 - 33 pg  MCHC 33 32 - 36   g/dL  RDW 16.1 <09.6 %  PLATELET 232 140 - 375 THOU/mcL  SEGS 57 %    LYMPHOCYTES 31 %  MONOCYTES 9 %  EOSINOPHIL 2 %  BASOPHILS 1 %  ABS.   NEUTROPHIL 5.76 1.80 - 7.70 THOU/mcL  ABS LYMPHS 3.13 1.00 - 4.00 THOU/mcL    ABS MONOS 0.91 (H) 0.20 - 0.90 THOU/mcL  ABS EOS 0.20 0.03 - 0.45 THOU/mcL    ABS BASOS 0.10 (H) 0.01 - 0.07 THOU/mcL UAS (Urinalysis)  Collection Time:   02/05/16  9:28 PM [facility] Value Ref Range  UA SPECIMEN SOURCE URINE (PRESERVED)   UNSPECIFIED  COLOR YELLOW YELLOW  APPEARANCE, URINE CLEAR CLEAR  PH, URINE   6.0 5.0 - 8.0  SPEC. GRAVITY, URINE GREATER THAN OR EQUAL TO 1.030 1.005 -   1.029  PROTEIN, URINE 1+ (A) NEGATIVE  GLUCOSE, URINE NEGATIVE NEGATIVE    KETONE, URINE TRACE (A) NEGATIVE  BILIRUBIN, URINE NEGATIVE NEGATIVE  BLOOD,   URINE NEGATIVE NEGATIVE  UROBILINOGEN, URINE 1.0 <2.0 mg/dL  NITRITE, URINE   NEGATIVE NEGATIVE  LEUKOCYTE ESTERASE, URINE 2+ (A) NEGATIVE Urine Microscopic    Collection Time: 02/05/16  9:28 PM [facility] Value Ref Range  RBC, URINE 0 to 3   0 - 5 /HPF  WBC, URINE GREATER THAN 100 0 - 3 /HPF  BACTERIA PRESENT /HPF    SQUAMOUS EPI CELLS 4 to 5 /HPF  OTHER OBSERVATIONS MUCUS PRESENT           RADIOLOGY No orders to display      PROCEDURES      ED COURSE & MEDICAL DECISION MAKING Pertinent Labs & Imaging studies   reviewed. (See chart for details)      The patient was given the following medication in the [facility]:      Medication Administration from 02/05/2016 2009 to 02/05/2016 2202      Date/Time Order Dose Route Action Action by Comments   02/05/2016 2105   hydrocodone-acetaminophen (NORCO) 5-325 MG per tablet 1 tablet 1 tablet Oral   Given Delrae Alfred   02/05/2016 2105 diazepam (VALIUM) tablet 2.5 mg   2.5 mg Oral Given Katherine Tuchfarber      Vitals:   02/05/16 2019 02/05/16 2155 BP: 157/65 (!) 130/94 Pulse: 98 97 Resp:   18 16 Temp: 97.7  F (36.5  C) SpO2: 100% 97% Weight: 229 lb (103.9 kg)   Height: 64" (162.6 cm)      Patient presents with right lower extremity discomfort. Relatively   generalized nondescript symptoms. No significant examination findings, vital   stable, abdomen soft, neurovascular intact in both lower extremities. History   of multiple sclerosis for which she is on beta interferon, baclofen and   Neurontin. Has had somewhat similar flares over the years.      Discussed with her neurologist at [facility], Dr. Genelle Gather.      Basic labs are benign appearing, urinalysis positive. We'll start her on   antibiotics and send a urine culture. Recommends temporary Neurontin increase   and she can follow-up with the office tomorrow  morning if she is not feeling   any better.      Patient was given the signs and symptoms of worsening condition and told to   return if they occurred.  Patient understands the plan and management and all   questions were answered.  Patient will follow up with her primary care   physician in the next 2-3 days.  Patient was instructed on Culture results and   preliminary radiology reads and that she would be contacted if a variance or   positive test is obtained.  The nursing note was reviewed, agreed and   appreciated.      Final Diagnosis:      1. Leg pain, right 2. History of multiple sclerosis 3. Acute urinary tract   infection      The patient was given the following medications to go home with.      New Prescriptions  SULFAMETHOXAZOLE-TRIMETHOPRIM (BACTRIM DS,SEPTRA DS)   800-160 MG TABS    Take 1 tablet by mouth 2 (two) times daily.           Christopher T. Cutter, MD      Georga Kaufmann., MD 02/05/16 2202           _________________________________  Signed by:   Beatriz Chancellor    D: 02/05/2016 08:50 PM  T: 02/05/2016 08:50 PM    This document is confidential medical  information.  Unauthorized disclosure or   use of this information is prohibited by law.  If you are not the intended recipient of this document, please advise [place] by   calling immediately [phone].

## 2016-02-05 NOTE — Other (Unsigned)
CURRENT STATUS IS EMERGENCY .       :      Any questions regarding PATIENT CLASS/STATUS should be directed to the Care   Management Departments: [facility] [phone] or [facility] [phone] or [facility]  [phone].           _________________________________  Signed byJamison Neighbor:        [facility]  NOTIFY     ZZ    D: 02/05/2016 08:09 PM  T:     This document is confidential medical information.  Unauthorized disclosure or   use of this information is prohibited by law.  If you are not the intended recipient of this document, please advise us by   calling immediately [phone].

## 2016-02-09 MED ORDER — LISINOPRIL-HYDROCHLOROTHIAZIDE 20-25 MG PO TABS
20-25 MG | ORAL_TABLET | ORAL | 1 refills | Status: DC
Start: 2016-02-09 — End: 2016-04-09

## 2016-02-09 MED ORDER — METFORMIN HCL 1000 MG PO TABS
1000 MG | ORAL_TABLET | ORAL | 1 refills | Status: DC
Start: 2016-02-09 — End: 2016-04-09

## 2016-02-09 NOTE — Telephone Encounter (Signed)
Due for follow up with Dr Analei Whinery in April.

## 2016-02-10 NOTE — Telephone Encounter (Signed)
lmom 

## 2016-02-11 NOTE — Unmapped (Signed)
Advised patient we did receive the tier exception for the Rebif.  She has a meeting with Medicare tomorrow and will take the letter with her.  She will contact [facility] to see if they will mail her some doses while all this gets sorted out.

## 2016-02-11 NOTE — Unmapped (Signed)
Pt called she would like for MD asst to call her back, she did not give me a reason why, she hung up before i could ask her what message i could relay.  Pls advise

## 2016-02-12 NOTE — Unmapped (Signed)
Pt called, [name] [employer] will assist her in getting an emergency dose, they faxed form a few minutes ago.  Please complete as soon as possible and send back, and let pt know when is done.

## 2016-02-12 NOTE — Unmapped (Signed)
Left message to patient to have the form refaxed to [phone]

## 2016-02-12 NOTE — Unmapped (Signed)
Spoke with patient called in verbal Rebid[facility] to [employer]-complete

## 2016-02-24 NOTE — Unmapped (Signed)
Pt called she states she is having trouble walking and she states usually she is prescribed a steroid.  Would like asst to call her  Pls advise

## 2016-02-24 NOTE — Unmapped (Signed)
Patient scheduled for tomorrow and will need refills on the Gabapentin-FYI

## 2016-02-25 ENCOUNTER — Ambulatory Visit: Admit: 2016-02-25 | Payer: MEDICARE

## 2016-02-25 DIAGNOSIS — G35 Multiple sclerosis: Secondary | ICD-10-CM

## 2016-02-25 LAB — CBC
Hematocrit: 40.1 % (ref 35.0–45.0)
Hemoglobin: 13.1 g/dL (ref 11.7–15.5)
MCH: 30 pg (ref 27.0–33.0)
MCHC: 32.7 g/dL (ref 32.0–36.0)
MCV: 91.8 fL (ref 80.0–100.0)
MPV: 9.3 fL (ref 7.5–11.5)
Platelets: 219 10*3/uL (ref 140–400)
RBC: 4.37 10*6/uL (ref 3.80–5.10)
RDW: 13.1 % (ref 11.0–15.0)
WBC: 9 10*3/uL (ref 3.8–10.8)

## 2016-02-25 LAB — DIFFERENTIAL
Basophils Absolute: 72 /uL (ref 0–200)
Basophils Relative: 0.8 % (ref 0.0–1.0)
Eosinophils Absolute: 108 /uL (ref 15–500)
Eosinophils Relative: 1.2 % (ref 0.0–8.0)
Lymphocytes Absolute: 2898 /uL (ref 850–3900)
Lymphocytes Relative: 32.2 % (ref 15.0–45.0)
Monocytes Absolute: 576 /uL (ref 200–950)
Monocytes Relative: 6.4 % (ref 0.0–12.0)
Neutrophils Absolute: 5346 /uL (ref 1500–7800)
Neutrophils Relative: 59.4 % (ref 40.0–80.0)
nRBC: 0 /100 WBC (ref 0–0)

## 2016-02-25 LAB — COMPREHENSIVE METABOLIC PANEL, SERUM
ALT: 42 U/L (ref 7–52)
AST (SGOT): 44 U/L (ref 13–39)
Albumin: 4.1 g/dL (ref 3.5–5.7)
Alkaline Phosphatase: 47 U/L (ref 36–125)
Anion Gap: 9 mmol/L (ref 3–16)
BUN: 14 mg/dL (ref 7–25)
CO2: 29 mmol/L (ref 21–33)
Calcium: 9.8 mg/dL (ref 8.6–10.3)
Chloride: 99 mmol/L (ref 98–110)
Creatinine: 0.63 mg/dL (ref 0.60–1.30)
Glucose: 91 mg/dL (ref 70–100)
Osmolality, Calculated: 284 mOsm/kg (ref 278–305)
Potassium: 4.8 mmol/L (ref 3.5–5.3)
Sodium: 137 mmol/L (ref 133–146)
Total Bilirubin: 0.4 mg/dL (ref 0.0–1.5)
Total Protein: 7.5 g/dL (ref 6.4–8.9)
eGFR AA CKD-EPI: >90 See note.
eGFR NONAA CKD-EPI: >90 See note.

## 2016-02-25 LAB — VITAMIN D 25 HYDROXY: Vit D, 25-Hydroxy: 72.8 ng/mL (ref 30.0–100)

## 2016-02-25 NOTE — Unmapped (Signed)
Date Symptom   Onset/Outcome Brain MRI Spine MRI Tests DMT period A/E Meds   2000 Fatigue, cognitive G/P        2004 Blurry vision B/L G/I  Dx MS    Rebif IVMP   2013  Stable 04-now  PV -ve      03/2014  09/2014 S 3/15 R leg W after cold    4/15 S       12/2014  9/16 S       12/2015                           Onset: Acute/Gradual , Outcome: Resolved/Improved/Progressed, MRI: Peri Ventricular/Juxta Cortical,      HPI:    02/25/16  Ms Fahs is here for evaluation of return of shooting pain in the right leg with difficulty walking and went to the Clinic where she was started on Antibiotics (Bactrim x 7 days) and given pain meds, Norco as well as Valium for spasms with improvement 3 days later.  After a week symptoms resolved and she returned to baseline until just yesterday when she started having the shooting pain  and right leg spasm again which affects her walking.  She is still on Rebif and takes it regularly with no missed doses.    01/13/16:  She denies any new symptoms or exacerbation except worsening of fatigue after stopping high dose vitamin D. Her family doctor recommended taking OTC but that does not help.     07/15/15 Sandy:  Ms Adisa is here for evaluation of worsening symptoms.  She reports that she has noticed that she has increased trouble walking when she gets up in the morning, she feels like she is a little of balance.  Once she has walked to the bathroom she is back to baseline.  She has also noticed that she has some pain in her right leg from halfway down her thigh to her calf which occurs every now and then for about 1 month. This has been going on for about 3 weeks, she otherwise has no other new symptoms.    01/08/15:  She thinks overall is stable, denies any new symptom or exacerbation. She received a steroid shot for knee pain which helped.      10/01/14:  I had the pleasure of evaluating Ms Wahba who is here for evaluation of new symptoms.  She is on Rebif since 2004 and take is regulalry with no  missed doses.  She developed new pain in her left knee and left leg, She also noticed increased numbness in that extremity which lasted about 3 days and it has started to improve.    04/09/14 Dr. Genelle Gather  On 4/2, she woke up and couldn't walk, felt her R leg was weak. Also lost sensation with tingling. She waited to see if this would get better. She called on 4/6 and Dr. Genelle Gather thought she is probably having exacerbation. Labs were done with CBC, BMP, UA which did not show evidence of infection. Nurse visited her at home to get IV access. IV solumedrol was started on 4/12. She got the second dose today. Since about Friday last week, sensation was coming back. Weakness is improved but not back to baseline. She is able to walk. Dancing is still hard. She is still on Rebif, doing ok with the injection. This is a first real exacerbation since 2004.   Recent exacerbation happened after recent URI.    02/27/13  She was working as Psychologist, educational at [employer]. She was feeling very tired with some cognitive and memory problem around 2000. Thne she had an episode of visual change in both eye in 2004 with partial recovery. She was Dx with MS based on MRI. She was started on Rebif . She thinks it works and tolerating it well however does not like injection. Her last MRI was from April 2013.   Last year she lost 25 Lbs by diet and exercise after she was diagnosed with Diabetes but she regain that weight. She is planning to go back in diet this week.     I reviewed Dr. 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 Pt is currently on Rebif and is tolerating it well. She has not had an MRI since January 2012. Since pts last visit she feels her condition is getting worse. She is experiencing cognitive issues including concentration and memory issues. She has also started to urinate uncontrollably and has missed work as a  result. Pt is having trouble walking due to pain in both knees and L hip pain. She also complains of stress at work which causes her to be depressed.   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 Pt is currently on Rebif and is tolerating it well. She has not had an MRI since January 2012. Since pts last visit she feels her condition is getting worse. She is experiencing cognitive issues including concentration and memory issues. She has also started to urinate uncontrollably and has missed work as a result. Pt is having trouble walking due to pain in both knees and L hip pain. She also complains of stress at work which causes her to be depressed.   Due to pts complaints of cognitive issues, we will refer her for a neuropsych eval. We will also give her the contact number to the social worker for the [facility]. I would like to obtain and MRI of the brain and c spine.      Review of System:      Weakness:  BLE when she first gets up  Pain:     R. Knee and hip, Steroid shot and Gabapentin   Sensory  Numbness r. foot  Fatigue:  bothersome  Sleep:        4 to 5 hrs uninterrupted,   Sleep Apnea:  No longer uses CPAP    STM   Main symptom she get disability   Urinary Incontinece Oxybutinin helps, she has done pelvic exercise, this was one the reason she stopped working   Walking Difficulty Tired and back pain, can do more after exercise   Imbalance  Trip over but no fall   Fatigue                        Exhausted    Life Style:      Living Situation: With daughters family  Diet:  Diet  Exercise: Line Dance as exercise 4 days a week   Vitamin D: None    Medications:      Current Outpatient Prescriptions   Medication Sig   ??? albuterol Inhale 2 puffs into the lungs. Every 4 to 6 hours as needed.    ??? baclofen TAKE ONE TABLET BY MOUTH THREE TIMES A DAY   ???  blood sugar diagnostic Test blood sugars up to twice a day   ??? cholecalciferol (vitamin D3) Take 1 capsule (50,000 Units total) by mouth once a  week.   ??? gabapentin Take 1 capsule (300 mg total) by mouth 3 times a day. Indications: Neuropathic Pain   ??? ibuprofen Take 1 tablet (800 mg total) by mouth every 8 hours as needed.   ??? inhalational spacing device by Misc.(Non-Drug; Combo Route) route. AEROCHAMBER PLUS.  Use with MDI    ??? interferon beta-1a (albumin) Inject 0.5 mLs (44 mcg total) subcutaneously every Monday, Wednesday, and Friday.   ??? lisinopril-hydrochlorothiazide Take 1 tablet by mouth daily.   ??? LORazepam 15 min before MRI, can be repeated after 15 min.   ??? lovastatin Take 40 mg by mouth daily with dinner.   ??? metFORMIN Take 1 tablet (500 mg total) by mouth 2 times a day with meals.   ??? oxybutynin TAKE TWO TABLETS BY MOUTH DAILY     No current facility-administered medications for this visit.      Allergies: Compazine and Prochlorperazine    History:      PMHx:  has a past medical history of Asthma; Multiple sclerosis; Hypertension; and Diabetes mellitus.     PSHx:  has past surgical history that includes Knee surgery; Appendectomy; and Hysterectomy.    Social Hx: reports that she has never smoked. She has never used smokeless tobacco. She reports that she drinks alcohol. She reports that she does not use illicit drugs. She lives by herself. She feels she is safe but has to take extra time. Feels depressed being lonely. Grandkids lives cross the street and come and visit her. She goes to [facility], do dance club and try to stay active.     ZOX:WRUEAV history includes Alcohol abuse in her father and other; Brain cancer in her other; COPD in her father; Cancer in her father, maternal grandmother, mother, and sister; Diabetes in her daughter, paternal aunt, paternal grandfather, and paternal uncle; Skin cancer in her other; Stomach cancer in her other; Throat cancer in her other.    Physical Exam:    Pulse 80   Ht 5' 6 (1.676 m)   Wt 223 lb (101.152 kg)   BMI 36.01 kg/m2     General: NAD  Resp:     Clear Bilaterally, regular unlabored breaths  CV:      RRR  No Edema, no skin rashes    Mental Status:Patient registered 3 objects without difficulty and recalled 3/3 after five minutes.  The patient was fully oriented and had a normal fund of knowledge.  Cranial Nerves: Color Vision12/12 OD and 10/12 OS, best corrected visual acuity was 20/20 OD and 20/20 OS.  There was no afferent pupillary deficit.  Funduscopic exam revealed normal optic nerve heads.   Horizontal and vertical eye movements were full.  There was no nystagmus.  Facial sensation was intact to pinprick.  There was no facial weakness.  Hearing was intact to finger rubbing bilaterally. Palate moved fully and symmetrically.  Tongue protruded in the midline.  There was no dysarthria.  Motor:   Strength:R-L (MRC Scale)   Deltoid  5 - 5   Hip Fx   4+ - 5 (R. Hip limited by pain)  Biceps   5 - 5   Knee Ex  5 - 5   Triceps  5 - 5   Knee Fx  5 - 5   Wrist Ex  5 - 5   Plantar  Fx 5 - 5   Grip         5 - 5   Ankle dorsiflex 5 - 5  Tone was normal  Reflexes: R / L (MRC Scale)   Biceps  2 / 2   Quadriceps 2 / 2   Triceps  2 / 2   Achilles  2 / 2   Brachoradialis 2 / 2   Toes   Up / down   Coordination: There was no ataxia on finger to nose or heel to shin testing.  Sensation: Decreased vibration BLE.     Position and pinprick sensation were intact in all four extremities.  Gait and station:  Unable to do tandem walk and hopping on either foot.   T25-FW date Time assistance   02/26/13  6 No   04/09/14 7.5 No   10/01/14 8.26 No  01/08/14 5.4 No   07/15/15 5.8 No  01/13/16 5 No   02/25/16  6.8 No  Tests:      09/04/15 compare to 04/08/14  Brain:  1. Stable moderate plaque burden within the periventricular and subcortical white matter compatible with the history of MS. No new or active lesions.  Cervical Spine:  1. No cord signal abnormality or enhancing lesions.  Thoracic spine:  1.?? No definite cord signal abnormality or enhancing lesions. Motion degraded examination limits cord evaluation.    04/08/14 COMPARISON: MRI dated  03/27/2012.  MRI head:   1. No significant interval change in the moderate burden of T2 signal prolongation involving the periventricular, deep, and central pontine white matter consistent with the clinical diagnosis of multiple sclerosis. No new lesions, restricted diffusion, or enhancement is identified.   2. Small amount of layering fluid signal in the right maxillary sinus.       MRI cervical spine:   1. No evidence of cord signal abnormalities or enhancement.   2. Very mild multilevel degenerative discogenic changes without evidence of central canal or neuroforaminal stenosis, unchanged.       MRI thoracic spine:   1. No evidence of cord signal abnormalities or enhancement.   2. Very mild degenerative discogenic changes without evidence of central canal or neuroforaminal stenosis, involving the T3-T4 through T7-T8 levels.      Lab Results   Component Value Date    WBC 8.1 01/13/2016    HGB 14.3 01/13/2016    HCT 43.0 01/13/2016    MCV 91.7 01/13/2016    PLT 219 01/13/2016     Lab Results   Component Value Date    CREATININE 0.62 01/13/2016    BUN 15 01/13/2016    NA 138 01/13/2016    K 3.6 01/13/2016    CL 97* 01/13/2016    CO2 33 01/13/2016     Lab Results   Component Value Date    ALT 57* 01/13/2016    AST 60* 04/02/2014    ALKPHOS 62 01/13/2016    BILITOT 0.4 01/13/2016     Lab Results   Component Value Date    VITD25H 36.9 01/13/2016     Lab Results   Component Value Date    VITAMINB12 322 10/01/2014     Lab Results   Component Value Date    TSH 1.42 10/01/2014     07/16/14 Vit D 25 59.5    Assessment/Plan:       Ms. Vegh is a 60 y.o. female with Multiple Sclerosis diagnosed in 2004.  She has been on Rebif since the time of  her diagnosis and her multiple sclerosis does not appear to be active as she has remained stable clinically with no evidence of new multiple sclerosis disease activity.  Her recent MRI in September 2016 was also stable.  She presents today with acute return of RLE leg pain and spasm x 3  weeks which is concerning and warrants additional evaluation to tease out multiple sclerosis activity vs LS radiculopathy vs musculoskeltal.  I recommend bloodwork and urine to r/o infection and metabolic imbalance which can cause worsening of multiple sclerosis symptoms in the interim but recommend MRI if initial workup is negative.  Although historically she does not have any multiple sclerosis lesions in her cord I do feel that repeat evaluation of her spine is appropriate given the acute presentation of symptoms, I also recommend Lumbar MRI.  She is very interested in a course of steroids, which I do not feel is  warranted at this time unless MRI shows new multiple sclerosis activity.   I discussed indications for use of high dose steroids and my misgivings about using them when not indicated due to the the high risk profile.  She verbalizes understanding but states that she would still like to try them if the preliminary workup is negative.    1. Multiple sclerosis  - Continue Avonex  - Differential; Standing  - CBC; Standing  - Vitamin D 25 hydroxy; Future  - Comprehensive Metabolic Panel, Serum; Future  - MRI Cervical spine W WO contrast; Future  - MRI Thoracic spine W WO contrast; Future  - MRI Lumbar spine W WO contrast; Future  - Call after bloodwork and UA to discuss next step  - F/U in 1 month    2. Pain of right lower extremity  - MRI Thoracic spine W WO contrast; Future  - MRI Lumbar spine W WO contrast; Future    3. Frequent UTI  - Amb referral to Urology    4. Spasm  - Increase Baclofen to 20mg  TID    5. Neuropathic pain  - Continue Gabapentin    6. Vitamin D deficiency  - Vitamin D 25 hydroxy; Future    Medical Decision Making:   * review/order clinical lab tests   * review/order radiology tests   * review/order other diagnostic or tx interventions   * obtain old records or history from another person   * review and summarization of old records   * independent review of data- e.g. image- specimen      * Permanent chart problem/surgery list reviewed   * Permanent chart chronic med/ allergy list reviewed   * Permanent chart social/family history reviewed   * reviewed films     Risks & Benefits:   * Risks, benefits and treatment options discussed with patient.   Risk of complications: moderate   Time spent with patient:   New: 45 min   More than 50% of this time was spent discussing:   * Diagnosis   * Management   * Treatment Plan   * Diagnostic Results as indicated above    Kristie Cowman CNP  [facility]   [address]   [place], [place] [address]????  [phone]

## 2016-02-25 NOTE — Unmapped (Addendum)
The new symptoms or acute worsening of old symptoms that last for more than 24 hours can be due to MS exacerbation or secondary to infection/metabolic causes (Psuedo-exacerbation). Recommend:   CBCdiff, CMP and UA reflex culture   May need repeat MRI of spine if bloodwork and urine are negative  Call back after test to discuss next step (steroid or not)  - F/U in 1 month for reevaulation         - Referral to Urology for frequent UTIs  - Increase Baclofen to 20mg s 3 times a day

## 2016-02-26 ENCOUNTER — Encounter

## 2016-02-26 ENCOUNTER — Ambulatory Visit: Admit: 2016-02-26 | Discharge: 2016-02-26

## 2016-02-27 ENCOUNTER — Encounter

## 2016-02-27 ENCOUNTER — Ambulatory Visit: Admit: 2016-02-27 | Payer: MEDICARE

## 2016-02-27 DIAGNOSIS — G35 Multiple sclerosis: Secondary | ICD-10-CM

## 2016-03-01 NOTE — Unmapped (Signed)
Patient advised order for UA faxed to [facility]-complete

## 2016-03-01 NOTE — Unmapped (Signed)
Fax# [phone] [facility]  Pt called back with number to fax prescription

## 2016-03-01 NOTE — Unmapped (Signed)
Pt is calling back to inform that she needs this order sent by 11:15 because she is on a schedule today. Asking for [name] to give her a call back.

## 2016-03-01 NOTE — Unmapped (Signed)
Pt calling back with Fax # for order:  She will call back with #  She also wants number to [facility] that was given.

## 2016-03-02 MED ORDER — oxybutynin (DITROPAN-XL) 10 MG 24 hr tablet
10 | ORAL_TABLET | ORAL | Status: AC
Start: 2016-03-02 — End: 2016-10-14

## 2016-03-03 ENCOUNTER — Ambulatory Visit: Admit: 2016-03-03

## 2016-03-04 ENCOUNTER — Encounter

## 2016-03-04 MED ORDER — pregabalin (LYRICA) 75 MG capsule
75 | ORAL_CAPSULE | Freq: Two times a day (BID) | ORAL | 0.00 refills | 30.00000 days | Status: AC
Start: 2016-03-04 — End: 2016-03-05

## 2016-03-04 NOTE — Unmapped (Signed)
Pt states that they did not receive the fax, pt states that asst can get fax to her daughter if she can do it by 3pm today.  Fax#[phone]

## 2016-03-04 NOTE — Unmapped (Signed)
Patient would like to speak with you regarding her Baclofen and Gabapentin-having some side effects-feels super sleepy

## 2016-03-04 NOTE — Unmapped (Signed)
Patient advised-order faxed-complete

## 2016-03-04 NOTE — Unmapped (Addendum)
I spoke to Melanie [name] who reports that since she increased Gabapentin and Baclofen she has been increasing drowsy during the day.  I had recommended increasing Baclofen at her last visit as she stated at the time that she was having more problems with spasm/pain.  She states that at this point she would like to try Lyrica because she is having a difficult time tolerating the 600mg  TID of Gabapentin.  I discussed other options such as Elavil but the patient states that she does not want to try anything else at this point since she has tried other meds before.  I will go ahead and discontinue Gabapentin and order Lyrica per patient request.

## 2016-03-05 MED ORDER — pregabalin (LYRICA) 75 MG capsule
75 | ORAL_CAPSULE | Freq: Two times a day (BID) | ORAL | 0.00 refills | 30.00000 days | Status: AC
Start: 2016-03-05 — End: 2016-10-15

## 2016-03-05 NOTE — Unmapped (Signed)
Addended by: Kristie Cowman on: 03/05/2016 08:51 AM     Modules accepted: Orders

## 2016-03-05 NOTE — Unmapped (Signed)
Rx called into pharmacy-complete

## 2016-03-06 LAB — URINE CULTURE

## 2016-03-30 NOTE — Unmapped (Signed)
I spoke to Melanie [name] who wanted to let us know that her leg pain feels a lot better after starting Lyrica and wanted to see if she still needed to get her MRI.  I had recommended Lumbar MRI which I still feel would be a good idea to repeat to evaluate lumbar problems that could potentially be contributing to her symptoms although currently improved I still feel this would be valuable information. She verbalizes understanding and assures me she will get the lumbar MRI

## 2016-03-30 NOTE — Unmapped (Signed)
Pt is calling to inform that she needs to cancel her appt for tomorrow and wants to speak with [name] regarding her urine results. Pt states she went to [facility] for the testing. Also pt states she did not get an MRI. Pls advise.

## 2016-04-10 MED ORDER — METFORMIN HCL 1000 MG PO TABS
1000 MG | ORAL_TABLET | ORAL | 0 refills | Status: DC
Start: 2016-04-10 — End: 2016-05-10

## 2016-04-10 MED ORDER — LISINOPRIL-HYDROCHLOROTHIAZIDE 20-25 MG PO TABS
20-25 MG | ORAL_TABLET | ORAL | 0 refills | Status: DC
Start: 2016-04-10 — End: 2016-05-10

## 2016-04-10 NOTE — Telephone Encounter (Signed)
Please call pt to schedule check-up in 1 month. For diabetes mellitus with Dr. Briant CedarMattingly

## 2016-04-12 NOTE — Telephone Encounter (Signed)
appt made.

## 2016-05-05 ENCOUNTER — Encounter: Payer: MEDICARE | Attending: Neurology

## 2016-05-10 MED ORDER — METFORMIN HCL 1000 MG PO TABS
1000 MG | ORAL_TABLET | ORAL | 4 refills | Status: DC
Start: 2016-05-10 — End: 2016-07-16

## 2016-05-10 MED ORDER — LOVASTATIN 40 MG PO TABS
40 MG | ORAL_TABLET | ORAL | 4 refills | Status: DC
Start: 2016-05-10 — End: 2016-08-18

## 2016-05-10 MED ORDER — LISINOPRIL-HYDROCHLOROTHIAZIDE 20-25 MG PO TABS
20-25 MG | ORAL_TABLET | ORAL | 4 refills | Status: DC
Start: 2016-05-10 — End: 2016-08-18

## 2016-05-11 NOTE — Progress Notes (Signed)
Letter sent

## 2016-05-12 ENCOUNTER — Encounter: Attending: Family Medicine

## 2016-05-28 ENCOUNTER — Ambulatory Visit: Admit: 2016-05-28

## 2016-06-07 ENCOUNTER — Ambulatory Visit: Admit: 2016-06-07 | Discharge: 2016-06-07

## 2016-07-16 MED ORDER — METFORMIN HCL 1000 MG PO TABS
1000 | ORAL_TABLET | ORAL | 1 refills | Status: DC
Start: 2016-07-16 — End: 2016-11-17

## 2016-07-16 NOTE — Telephone Encounter (Signed)
Request is for 90 day supply

## 2016-07-21 ENCOUNTER — Encounter: Attending: Family Medicine

## 2016-07-26 NOTE — Unmapped (Signed)
Pt would like to start Vit B12 injections, please call her.

## 2016-07-26 NOTE — Unmapped (Signed)
We do not have a recent B12 level to justify current B12 Deficiency requiring injections.  I am happy to check her B12 levels to see if she is low.

## 2016-07-26 NOTE — Unmapped (Signed)
Patient advised-complete

## 2016-07-26 NOTE — Unmapped (Signed)
Spoke with patient she is inquiring about doing B12 injections

## 2016-08-18 MED ORDER — LOVASTATIN 40 MG PO TABS
40 | ORAL_TABLET | ORAL | 0 refills | Status: DC
Start: 2016-08-18 — End: 2016-11-17

## 2016-08-18 MED ORDER — LISINOPRIL-HYDROCHLOROTHIAZIDE 20-25 MG PO TABS
20-25 | ORAL_TABLET | ORAL | 0 refills | Status: DC
Start: 2016-08-18 — End: 2016-11-11

## 2016-08-18 NOTE — Telephone Encounter (Signed)
Patient is over-due for follow up.  Please call them to schedule. Medicines will be refilled this time to allow for making an appointment.

## 2016-08-27 NOTE — Unmapped (Signed)
Pt is calling to inform that she needs an order for a renal panel placed. Pt is sched for MRI 09/02/16. pls advise when order placed.

## 2016-08-27 NOTE — Unmapped (Addendum)
Spoke with patient has been having pain left side of her buttocks and lower back advised to schedule her MRI of the cervical, thoracic and lumbar per Sandy's last visit-FYI

## 2016-08-27 NOTE — Unmapped (Signed)
Pt is calling to inform that she is having pain in her hip and leg on left side of her body. Pt states she did not get the MRI done. Asked to speak with [name] about this.

## 2016-08-31 ENCOUNTER — Ambulatory Visit: Admit: 2016-08-31 | Discharge: 2016-08-31

## 2016-08-31 ENCOUNTER — Other Ambulatory Visit: Admit: 2016-08-31 | Payer: MEDICARE

## 2016-08-31 DIAGNOSIS — Z5181 Encounter for therapeutic drug level monitoring: Secondary | ICD-10-CM

## 2016-08-31 LAB — RENAL FUNCTION PANEL W/EGFR
Albumin: 4 g/dL (ref 3.5–5.7)
Anion Gap: 9 mmol/L (ref 3–16)
BUN: 15 mg/dL (ref 7–25)
CO2: 30 mmol/L (ref 21–33)
Calcium: 9.6 mg/dL (ref 8.6–10.3)
Chloride: 101 mmol/L (ref 98–110)
Creatinine: 0.67 mg/dL (ref 0.60–1.30)
Glucose: 112 mg/dL (ref 70–100)
Osmolality, Calculated: 292 mOsm/kg (ref 278–305)
Phosphorus: 3.6 mg/dL (ref 2.1–4.7)
Potassium: 4.1 mmol/L (ref 3.5–5.3)
Sodium: 140 mmol/L (ref 133–146)
eGFR AA CKD-EPI: >90 See note.
eGFR NONAA CKD-EPI: >90 See note.

## 2016-08-31 MED ORDER — LORazepam (ATIVAN) 0.5 MG tablet
0.5 | ORAL_TABLET | ORAL | Status: AC
Start: 2016-08-31 — End: 2016-09-13

## 2016-08-31 NOTE — Unmapped (Signed)
Called to pharmacy-complete

## 2016-08-31 NOTE — Unmapped (Signed)
Pt states she needs a prescription for something prior to MRI on Thursday due to being claustrophobic. Also needed to know if her labwork was in the computer. Please advise.

## 2016-09-02 ENCOUNTER — Inpatient Hospital Stay: Admit: 2016-09-02 | Payer: MEDICARE

## 2016-09-02 DIAGNOSIS — G35 Multiple sclerosis: Secondary | ICD-10-CM

## 2016-09-02 MED ORDER — GADAVIST (gadobutrol) Soln 10 mL
1 | Freq: Once | INTRAVENOUS | Status: AC | PRN
Start: 2016-09-02 — End: 2016-09-02
  Administered 2016-09-02: 14:00:00 10 mL/kg via INTRAVENOUS

## 2016-09-02 MED FILL — GADAVIST 1 MMOL/ML (604.72 MG/ML) INTRAVENOUS SOLUTION: 1 1 mmol/mL (604.72 mg/mL) | INTRAVENOUS | Qty: 10

## 2016-09-03 NOTE — Unmapped (Signed)
Pt calling to speak with [name]. Wants to drop off paper work but wants to make sure its gets to [facility]. Will be in today to drop it off.

## 2016-09-03 NOTE — Unmapped (Signed)
Patient will be dropping off forms today to be completed

## 2016-09-06 NOTE — Telephone Encounter (Signed)
Scheduled for oct 6

## 2016-09-09 ENCOUNTER — Inpatient Hospital Stay: Payer: MEDICARE

## 2016-09-13 MED ORDER — LORazepam (ATIVAN) 0.5 MG tablet
0.5 | ORAL_TABLET | ORAL | 0 refills | Status: AC
Start: 2016-09-13 — End: 2018-04-27

## 2016-09-13 NOTE — Telephone Encounter (Signed)
Pt is calling to request an order ativan to be phoned in to the [place] pharmacy on file. Pt is having another MRI on Thursday morning.

## 2016-09-13 NOTE — Telephone Encounter (Signed)
Rx called into pharmacy.  Pt notified.

## 2016-09-14 ENCOUNTER — Encounter

## 2016-09-16 ENCOUNTER — Ambulatory Visit: Payer: MEDICARE

## 2016-09-23 ENCOUNTER — Inpatient Hospital Stay: Admit: 2016-09-23 | Payer: MEDICARE

## 2016-09-23 DIAGNOSIS — G35 Multiple sclerosis: Secondary | ICD-10-CM

## 2016-09-23 MED ORDER — GADAVIST (gadobutrol) Soln 10 mL
1 | Freq: Once | INTRAVENOUS | Status: AC | PRN
Start: 2016-09-23 — End: 2016-09-23
  Administered 2016-09-23: 14:00:00 10 mL/kg via INTRAVENOUS

## 2016-09-23 MED FILL — GADAVIST 1 MMOL/ML (604.72 MG/ML) INTRAVENOUS SOLUTION: 1 1 mmol/mL (604.72 mg/mL) | INTRAVENOUS | Qty: 10

## 2016-09-27 NOTE — Unmapped (Signed)
I called Ms Haidar regarding her recent MRI. Cervical and Thoracic MRI is stable no MS lesions.  However the Lumbar MRI does show some multilevel disc disease and moderate L3 and L4 Stenosis which may be contributing to some of the ongoing pain in the lower extremity.  I recommend evaluation by Ortho - Spinal and have placed a referral in EP[facility] for her to schedule an appt.  For appointments, please call [phone].    09/02/16  Unchanged minimal noncompressive disc bulges at C4-C5, and C5-C6 without spinal cord signal abnormalities or enhancement.   Fairly extensive signal abnormality seen in the pons which appears unchanged.    09/23/16  Thoracic spine:  Limited evaluation of thoracic cord signal secondary to artifact, without definite cord signal abnormality.    Lumbar spine:  Multilevel degenerative changes of the lumbar spine as detailed, most significant at L2-L3 and L3-L4 as detailed. There is moderate bilateral L3 and right L4 foraminal stenosis.  Enhancement along the facet joints at L3-L4 may represent synovitis. At this level, there is minimal grade 1 anterolisthesis of L3 on L4.

## 2016-09-27 NOTE — Telephone Encounter (Signed)
Left message for patient to call office-Please relay Melanie [name] message when she calls office

## 2016-09-28 NOTE — Telephone Encounter (Signed)
Pt called, given MRI msg, states she has Ortho appt this Friday.  Requests call back to further discuss results.

## 2016-09-28 NOTE — Telephone Encounter (Signed)
I spoke to Ms Lavanway who reports that she is already scheduledto see her Orthopedic Dr. On Friday and will discuss Lumbar MRI results.

## 2016-10-01 ENCOUNTER — Encounter: Attending: Family Medicine

## 2016-10-01 ENCOUNTER — Inpatient Hospital Stay: Admit: 2016-10-01 | Payer: MEDICARE

## 2016-10-01 ENCOUNTER — Ambulatory Visit: Admit: 2016-10-01 | Discharge: 2016-10-01 | Payer: MEDICARE

## 2016-10-01 DIAGNOSIS — M5136 Other intervertebral disc degeneration, lumbar region: Secondary | ICD-10-CM

## 2016-10-01 DIAGNOSIS — M25552 Pain in left hip: Secondary | ICD-10-CM

## 2016-10-01 MED ORDER — triamcinolone acetonide (KENALOG-40) injection 80 mg
40 | Freq: Once | INTRAMUSCULAR | Status: AC
Start: 2016-10-01 — End: 2016-10-01
  Administered 2016-10-01: 15:00:00 80 mg

## 2016-10-01 MED ORDER — bupivacaine (MARCAINE) 0.5 % (5 mg/mL) injection 25 mg
0.5 | Freq: Once | INTRAMUSCULAR | Status: AC
Start: 2016-10-01 — End: 2016-10-01
  Administered 2016-10-01: 15:00:00 25 mL via SUBCUTANEOUS

## 2016-10-01 NOTE — Unmapped (Signed)
[place]                    [facility]     PATIENT NAME:  Melanie [name], Melanie [name].                   MRN:  [id number]  DATE OF BIRTH:  April 23, 1956                       CSN:  [id number]  PROVIDER:  Cephus Shelling, M.D.                  VISIT DATE:  10/01/2016                                       OFFICE NOTE     Melanie [name] is a pleasant 60 year old female who is here for evaluation of her  significant left-sided hip, buttock and upper thigh pain.  This has been  going on for the last few months.  She denies any other symptoms.  Hero used  to work at [employer] at the [employer] [place].  She was diagnosed with MS and retired shortly after that.  She  denies any significant low back pain.  Neurologically, pain is worse with  walking.  Lying on that side at night bothers her.     PAST MEDICAL HISTORY:  1. Diabetes.     REVIEW OF SYSTEMS:  A 10-point review of systems is negative for bowel or  bladder dysfunction, cardiac, pulmonary or renal conditions.     PHYSICAL EXAMINATION:  She is a pleasant female in no acute distress.  She  has good range of motion of her lumbar spine.  She is very tender over the  left greater trochanter.  Neurologically she is intact.  No upper motor  neuron signs.  Good pulses distally.  Good respiratory effort.     MRI of her lumbar spine shows degenerative scoliosis.  She does have some  stenosis, but nothing significant that needs surgical intervention.  I think  conservative treatment should help her, physical therapy.     I injected her greater trochanter today with 2 of Kenalog and 5 of Marcaine,  told her to keep an eye on her sugars the next few days.  Hopefully with the  injection she will get better and also with the physical therapy.  If no  improvement, next option would be to consider epidurals.                                              Cephus Shelling, M.D.  SA/wls  D:   10/01/2016 09:35  T:  10/04/2016 14:33  Job #:  [id number]           OFFICE NOTE                                                  PAGE    1 of   1

## 2016-10-01 NOTE — Progress Notes (Signed)
This office note has been dictated.

## 2016-10-01 NOTE — Progress Notes (Signed)
Per provider's instructions, the patient was prepped for an injection - as well as drug  allergies were reviewed.   The appropriate medication was drawn, and documented.  A medical assistant was also present to assist with the injection.   The patient was then educated on what to expect following the injection.   All questions were answered.    A verbal consent was obtained and a verbal timeout was performed on 10/01/2016 at 9:17 AM.      [facility] Jerrico Covello

## 2016-10-14 MED ORDER — oxybutynin (DITROPAN-XL) 10 MG 24 hr tablet
10 | ORAL_TABLET | ORAL | 0 refills | Status: AC
Start: 2016-10-14 — End: 2016-11-25

## 2016-10-15 MED ORDER — pregabalin (LYRICA) 75 MG capsule
75 | ORAL_CAPSULE | Freq: Two times a day (BID) | ORAL | 5 refills | Status: AC
Start: 2016-10-15 — End: 2016-11-14

## 2016-10-15 MED ORDER — LYRICA 75 mg capsule
75 | ORAL_CAPSULE | Freq: Two times a day (BID) | ORAL | 1 refills | Status: AC
Start: 2016-10-15 — End: 2016-10-15

## 2016-10-15 NOTE — Telephone Encounter (Signed)
Rx called into pharmacy.

## 2016-10-15 NOTE — Telephone Encounter (Signed)
Pt requests refill of Lyrica, pharmacy called the wrong dr's office this morning for refill, has two pills left.

## 2016-10-15 NOTE — Telephone Encounter (Signed)
Rx will be called in to Pharmacy

## 2016-10-27 ENCOUNTER — Ambulatory Visit: Admit: 2016-10-27 | Discharge: 2016-10-27 | Payer: MEDICARE | Attending: Family Medicine

## 2016-10-27 DIAGNOSIS — E113299 Type 2 diabetes mellitus with mild nonproliferative diabetic retinopathy without macular edema, unspecified eye: Secondary | ICD-10-CM

## 2016-10-27 NOTE — Progress Notes (Signed)
Subjective:      Patient ID: Danielle Miles is a 60 y.o. female.    Blood pressure 124/86, pulse 84, temperature 98.1 ??F (36.7 ??C), temperature source Oral, resp. rate 22, height 5' 4"  (1.626 m), weight 233 lb (105.7 kg), not currently breastfeeding.     HPI here for DM check up. Dx 2013.  Has not been her in over a year. On metformin alone.  A1c today is 7.6%.  Prior was 6.7%:  Lab Results   Component Value Date    LABA1C 6.7 10/24/2015     Lab Results   Component Value Date    EAG 145.6 10/24/2015      She got a steroid shot in her hip last month and thinks sugars are higher now as a result.    Tests sugars in AM: says 120's is typical.  After eating: 125-130 (1 hr postprandial).  Sugars were higher right after her steroid shot- up to 140's fasting.    Eye care: last went 4/17  Has hx of retinopathy.  No longer sees DR Burke Keels.     She line dances for exercise.     No foot numbness or tingling.   Has normal renal function and no proteinuria.  Lab Results   Component Value Date    NA 141 10/24/2015    K 4.0 10/24/2015    BUN 12 10/24/2015    CREATININE 0.7 10/24/2015        HTN: on prinzide and doing well with this.  No chest pains, dizziness, heart palpitations, dyspnea, lightheadedness, worsening edema.   No hx of CAD, TIA, CVA or PVD.     Hyperlipidemia: takes lova. No SE's.  Is fasting today for recheck.    Lab Results   Component Value Date    CHOL 149 10/24/2015    TRIG 56 10/24/2015    HDL 64 (H) 10/24/2015    LDLCALC 74 10/24/2015     Lab Results   Component Value Date    ALT 44 (H) 10/24/2015    AST 35 10/24/2015           Patient Active Problem List   Diagnosis   ??? HTN (hypertension)   ??? Type 2 diabetes mellitus ([place]) (dx 2013)   ??? Overactive bladder- related to MS   ??? Multiple sclerosis ([place])- dx ~2000; Dr Lam[facility]   ??? Fibromyalgia- dx by DR Candace Gallus    ??? OSA (obstructive sleep apnea)- has CPAP, but not using.   ??? Hyperlipidemia   ??? Mild nonproliferative diabetic retinopathy without macular edema  associated with type 2 diabetes mellitus ([place]) Dr Burke Keels   ??? S/P colonoscopic polypectomyx5; 06/16/15; DR Sherlyn Lick      Body mass index is 39.99 kg/m??.    Wt Readings from Last 3 Encounters:   10/27/16 233 lb (105.7 kg)   10/21/15 217 lb (98.4 kg)   08/18/15 219 lb 3.2 oz (99.4 kg)      BP Readings from Last 3 Encounters:   10/27/16 124/86   10/21/15 130/80   08/18/15 121/75      Current Outpatient Prescriptions   Medication Sig Dispense Refill   ??? pregabalin (LYRICA) 75 MG capsule Take 75 mg by mouth     ??? lisinopril-hydrochlorothiazide (PRINZIDE;ZESTORETIC) 20-25 MG per tablet TAKE ONE TABLET BY MOUTH DAILY 30 tablet 0   ??? lovastatin (MEVACOR) 40 MG tablet TAKE ONE TABLET BY MOUTH DAILY 30 tablet 0   ??? metFORMIN (GLUCOPHAGE) 1000 MG tablet TAKE  ONE TABLET BY MOUTH TWICE A DAY WITH MEALS 180 tablet 1   ??? baclofen (LIORESAL) 10 MG tablet TAKE ONE TABLET BY MOUTH THREE TIMES A DAY 90 tablet 5   ??? Blood Glucose Monitoring Suppl (ACCU-CHEK NANO SMARTVIEW) W/DEVICE KIT Use once daily. 1 kit 0   ??? PROVENTIL HFA 108 (90 BASE) MCG/ACT inhaler INHALE TWO PUFFS BY MOUTH EVERY 6 HOURS AS NEEDED FOR WHEEZING OR FOR SHORTNESS OF BREATH 1 Inhaler 0   ??? glucose blood VI test strips (ACCU-CHEK AVIVA) strip 1 each by In Vitro route daily E11.9  Accucheck Nano 100 each 0   ??? LORazepam (ATIVAN) 0.5 MG tablet Take 1 tab by mouth an hour prior to MRI scan.  May repeat dose x1. 2 tablet 0   ??? gabapentin (NEURONTIN) 300 MG capsule Take 300 mg by mouth 3 times daily     ??? oxybutynin (DITROPAN-XL) 10 MG CR tablet Take 10 mg by mouth nightly      ??? Cholecalciferol (VITAMIN D3) 50000 UNITS CAPS Take by mouth once a week     ??? pravastatin (PRAVACHOL) 40 MG TABS Take 40 mg by mouth every evening 30 tablet 5     No current facility-administered medications for this visit.       Immunization History   Administered Date(s) Administered   ??? Pneumococcal Polysaccharide (Pneumovax23) 09/25/2014        Family History   Problem Relation Age of Onset   ???  Cancer Mother 67     brain   ??? Cancer Father 62     lung cancer     Social History     Social History   ??? Marital status: Single     Spouse name: N/A   ??? Number of children: 2   ??? Years of education: N/A     Occupational History   ??? disabled- due to [place]; was OR clerk at [employer]   ??? Smoking status: Never Smoker   ??? Smokeless tobacco: Never Used   ??? Alcohol use Yes      Comment: occ mixed drinks.   ??? Drug use: No   ??? Sexual activity: Not Currently     Partners: Male     Other Topics Concern   ??? Not on file     Social History Narrative    Lives with daugher, Darron Doom      Review of Systems No chest pains, dizziness, heart palpitations, dyspnea, lightheadedness, worsening edema.     Objective:   Physical Exam   Constitutional: She is oriented to person, place, and time. She appears well-developed and well-nourished.   Neck: Normal range of motion. Neck supple. Carotid bruit is not present. No thyromegaly present.   Cardiovascular: Normal rate, regular rhythm, normal heart sounds and intact distal pulses.    No murmur heard.  Pulmonary/Chest: Effort normal and breath sounds normal. No respiratory distress. She has no wheezes. She has no rales.   Musculoskeletal: Normal range of motion. She exhibits no edema.   Feet normal; no lesions.  Sensation intact to SW monofilament and vibratory sense bilaterally with intact pulses.    Neurological: She is alert and oriented to person, place, and time.   Skin: Skin is warm and dry.   Psychiatric: She has a normal mood and affect. Her behavior is normal. Judgment and thought content normal.   Nursing note and vitals reviewed.      Assessment:  1. Type 2 diabetes mellitus with mild nonproliferative retinopathy without macular edema, without long-term current use of insulin, unspecified laterality (HCC)  - a1c higher today (wt gain and steroid shot?)  discussed wt loss and continue metformin.  ac1 7.6.  - fu yearly with optho for eye care and  screening for progression of retinopathy.  - Microalbumin / Creatinine Urine Ratio; Future  - HM DIABETES FOOT EXAM    2. Essential hypertension  - Blood pressure is at goal on current therapy; continue meds and monitoring. Regular physical activity and healthy diet (low sodium, multiple servings of produce daily) was recommended.  Renal function to be assessed yearly.   - COMPREHENSIVE METABOLIC PANEL; Future    3. Pure hypercholesterolemia  - Stable; continue med intensity statin for primary prevention.  - Lipid Panel; Future    4. Class 2 obesity due to excess calories with serious comorbidity and body mass index (BMI) of 39.0 to 39.9 in adult  The patient is asked to make an attempt to improve diet and exercise patterns to aid in medical management of this problem. She will stop eating after dinner.     5. Multiple sclerosis ([place])- dx ~2000; Dr Lavonna Rua at [place]; continue care per neurology.         Plan:      F/u 6 months.

## 2016-11-05 MED ORDER — interferon beta-1a, albumin, (REBIF, WITH ALBUMIN,) 44 mcg/0.5 mL injection
44 | SUBCUTANEOUS | 11 refills | 28.00000 days | Status: AC
Start: 2016-11-05 — End: 2017-07-06

## 2016-11-05 NOTE — Telephone Encounter (Signed)
There is no information for [facility] specialty pharmacy on her chart

## 2016-11-05 NOTE — Telephone Encounter (Signed)
Rep fr [facility] specialty is calling to request refill of rebiff subq 3 times per week to be sent.

## 2016-11-08 NOTE — Telephone Encounter (Signed)
Spoke with pt and was advised she use pre-filled syringes.

## 2016-11-08 NOTE — Telephone Encounter (Signed)
[facility] specialty is calling to make sure the pt is now using the pre-filled syringes.

## 2016-11-11 MED ORDER — cholecalciferol, vitamin D3, 50,000 unit capsule
1250 | ORAL_CAPSULE | ORAL | 0 refills | Status: AC
Start: 2016-11-11 — End: 2017-03-16

## 2016-11-12 MED ORDER — LISINOPRIL-HYDROCHLOROTHIAZIDE 20-25 MG PO TABS
20-25 MG | ORAL_TABLET | ORAL | 5 refills | Status: DC
Start: 2016-11-12 — End: 2016-11-17

## 2016-11-12 MED ORDER — BACLOFEN 10 MG PO TABS
10 MG | ORAL_TABLET | ORAL | 5 refills | Status: DC
Start: 2016-11-12 — End: 2016-11-17

## 2016-11-17 MED ORDER — METFORMIN HCL 1000 MG PO TABS
1000 | ORAL_TABLET | ORAL | 1 refills | Status: DC
Start: 2016-11-17 — End: 2017-10-17

## 2016-11-17 MED ORDER — LOVASTATIN 40 MG PO TABS
40 | ORAL_TABLET | ORAL | 5 refills | Status: DC
Start: 2016-11-17 — End: 2017-01-12

## 2016-11-17 MED ORDER — LISINOPRIL-HYDROCHLOROTHIAZIDE 20-25 MG PO TABS
20-25 | ORAL_TABLET | ORAL | 5 refills | Status: DC
Start: 2016-11-17 — End: 2016-12-17

## 2016-11-17 MED ORDER — BACLOFEN 10 MG PO TABS
10 | ORAL_TABLET | ORAL | 5 refills | Status: DC
Start: 2016-11-17 — End: 2017-01-12

## 2016-11-25 MED ORDER — oxybutynin (DITROPAN-XL) 10 MG 24 hr tablet
10 | ORAL_TABLET | Freq: Every day | ORAL | 11 refills | Status: AC
Start: 2016-11-25 — End: 2016-11-26

## 2016-11-26 MED ORDER — oxybutynin (DITROPAN-XL) 10 MG 24 hr tablet
10 | ORAL_TABLET | Freq: Every day | ORAL | 6 refills | Status: AC
Start: 2016-11-26 — End: 2017-07-14

## 2016-11-26 NOTE — Unmapped (Signed)
[facility] is requesting clarification or medication directions for Oxybutynin 10 Mg, script currently states take 1 tablet (10 mg total) by mouth daily. Take two tablets by mouth daily. Please advise.

## 2016-11-26 NOTE — Telephone Encounter (Signed)
I spoke to [place] and verified RX for Oxybutynin 20mg  (2 pills) daily by [place].  I sent in a new rx to [facility] as well.

## 2016-12-03 ENCOUNTER — Encounter

## 2016-12-06 NOTE — Progress Notes (Signed)
Overdue lab reminder with orders mailed out to pt on 12/06/16

## 2016-12-17 MED ORDER — AMLODIPINE BESYLATE 5 MG PO TABS
5 MG | ORAL_TABLET | Freq: Every day | ORAL | 3 refills | Status: DC
Start: 2016-12-17 — End: 2016-12-18

## 2016-12-17 MED ORDER — ATENOLOL-CHLORTHALIDONE 50-25 MG PO TABS
50-25 MG | ORAL_TABLET | Freq: Every day | ORAL | 1 refills | Status: DC
Start: 2016-12-17 — End: 2016-12-17

## 2016-12-17 NOTE — Telephone Encounter (Signed)
Called pt  She was seen in ED yesterday for allergic reaction (summary in care Everywhere)  States she was told she should dc Lisinopril as that may have been the cause  Is very concerned about not being on a bp med and would like Dr Briant CedarMattingly to rx a new one for her  Her daughter takes amlodipine and wants to know if she can try that  Please advise

## 2016-12-17 NOTE — Other (Unsigned)
[facility] En[facility] Arrival Date:   12/17/16 0008 Danielle SpiroJanet J Miles                            DOB: 17-May-1956 [id number]   [address] [place] [id number] MRN: [id number] CSN: [id number]     HAR:   [id number]      EMERGENCY DEPARTMENT -  Extremity NOTE      CHIEF COMPLAINT Chief Complaint Patient presents with   Allergic Reaction     pt is on Lisinopril and started with lip and tongue swelling and scratchy   throat about 1 hr ago      HPI Danielle Miles is a 60 year old female who presents swelling to her lower   lip. Start on the left side.  Although she reported to the nurses tongue   swelling she does not feel any currently to me she did have a bit of a   scratchy throat associated with this.  Started after eating some ice cream.    She has no known drug allergies.  She is on lisinopril which she's been on for   quite some time. Denies shortness breath nausea or vomiting.      REVIEW OF SYSTEMS Constitutional:  Denies fever or chills Eyes:  Denies   change in visual acuity HENT:  Denies nasal congestion or sore throat   Respiratory:  Denies cough or shortness of breath Cardiovascular:  Denies   chest pain or edema GI:  Denies abdominal pain, nausea, vomiting, bloody   stools or diarrhea GU:  Denies dysuria Musculoskeletal:  Denies back pain or   joint pain Integument:  Denies rash Neurologic:  Denies headache, focal   weakness or sensory changes Endocrine:  Denies polyuria or polydipsia   Lymphatic:  Denies swollen glands Psychiatric:  Denies depression or anxiety   See HPI for further details. All other review of systems otherwise negative      PAST MEDICAL HISTORY Past Medical History: Diagnosis Date   Asthma   Multiple   sclerosis (HCC)   Type II or unspecified type diabetes mellitus without   mention of complication, not stated as uncontrolled   Unspecified essential   hypertension      SURGICAL HISTORY Past Surgical History: Procedure Laterality Date     APPENDECTOMY    HX ECHOCARDIOGRAM   HX MYOVIEW CARDIAC STRESS TESTING     HYSTERECTOMY   KNEE SURGERY      CURRENT MEDICATIONS Prior to Admission medications Medication Sig Start Date   End Date Taking? Authorizing Provider pregabalin (LYRICA) 75 MG CAPS Take 75   mg by mouth 2 (two) times daily.   Yes HISTORICAL MED Cholecalciferol 50000   units CAPS Take 50,000 Units by mouth every 30 (thirty) days.   Yes HISTORICAL   MED lovastatin (MEVACOR) 40 MG TABS Take 40 mg by mouth at bedtime.   Yes   HISTORICAL MED ibuprofen (ADVIL,MOTRIN) 800 MG TABS Take 800 mg by mouth every   6 (six) hours as needed.   Yes HISTORICAL MED lisinopril-hydrochlorothiazide   20-25 (ZESTORETIC) combo dose Take  by mouth daily.   Yes HISTORICAL MED   oxybutynin (DITROPAN) 5 MG TABS Take 5 mg by mouth 2 (two) times daily.   Yes   HISTORICAL MED albuterol 108 (90 BASE) MCG/ACT AERS Use 2 puffs every 6 (six)   hours as needed. 03/25/14  Yes Metzger, Doristine SectionPaul Vincent, MD  interferon beta-1a   (REBIF) 44 MCG/0.5ML SOLN Use 44 mcg 3 (three) times a week. Yes HISTORICAL   MED baclofen (LIORESAL) 10 MG TABS Take 10 mg by mouth 3 (three) times daily.     Yes HISTORICAL MED metFORMIN (GLUCOPHAGE) 500 MG TABS Take 500 mg by mouth 2   (two) times daily with meals.   Yes HISTORICAL MED hydrocodone-acetaminophen   (NORCO) 5-325 MG TABS Take 1-2 tablets by mouth every 6 (six) hours as needed.   02/05/16   Cutter, Burlene Arnthristopher T., MD diazepam (VALIUM) 2 MG TABS Take 1 tablet   by mouth every 8 (eight) hours as needed (muscle spasm/pain). 02/05/16     Cutter, Burlene Arnthristopher T., MD sulfamethoxazole-trimethoprim (BACTRIM DS,SEPTRA   DS) 800-160 MG TABS Take 1 tablet by mouth 2 (two) times daily. 02/05/16   12/17/16  Cutter, Burlene Arnthristopher T., MD pravastatin (PRAVACHOL) 40 MG TABS Take   40 mg by mouth daily.  12/17/16 HISTORICAL MED gabapentin (NEURONTIN) 300 MG   CAPS Take 300 mg by mouth 3 (three) times daily. 12/17/16  HISTORICAL MED      ALLERGIES Allergies Allergen Reactions   Compazine  [Prochlorperazine] Muscle   Aches   Muscle spasm      FAMILY HISTORY Family History Problem Relation Age of Onset   Diabetes Father     Cancer Brother   Cancer Sister      SOCIAL HISTORY Social History      Social History   Marital status: Divorced   Spouse name: N/A   Number of   children: N/A   Years of education: N/A      Social History Main Topics   Smoking status: Never Smoker   Smokeless   tobacco: Never Used   Alcohol use Yes    Comment: occasional   Drug use: No     Sexual activity: Not Asked      Other Topics Concern   None      Social History Narrative   None      PHYSICAL EXAM VITAL SIGNS: BP 122/79    Pulse 74    Temp 98.2  F (36.8  C)   (Oral)    Resp 18   Ht 64" (162.6 cm)    Wt 99.8 kg (220 lb)    SpO2 97%      BMI 37.76 kg/m2 Constitutional:  Well developed, well nourished, no acute   distress, non-toxic appearance HENT:  Atraumatic, external ears normal, nose   normal, oropharynx moist.  Neck- normal range of motion, no tenderness, supple   swelling one small area air on the left lower lip as well as earlier in the   right lower lip.  No tongue swelling Respiratory:  No respiratory distress,   normal breath sounds. Cardiovascular:  Normal rate, normal rhythm, no murmurs,   no gallops, no rubs GI:  Soft, nondistended, normal bowel sounds, nontender   Musculoskeletal:  No edema, no tenderness, no deformities. Back- no tenderness   Integument:  Well hydrated, no rash Neurologic:  Alert & oriented x 3, CN   2-12 normal, normal motor function, normal sensory function, no focal deficits   noted      LABORATORY No results found for this or any previous visit (from the past 24   hour(s)).      RADIOLOGY No orders to display      PROCEDURES      ED COURSE & MEDICAL DECISION MAKING      Patient symptoms improved with  Solu-Medrol Pepcid and Benadryl.  I think this   is more an allergic reaction than actually an angioedema secondary to ace   inhibitors as he she did have improvement.  He did however nonetheless  suggest   that she hold her lisinopril until she can talk to her primary care physician   she is going to either need a replacement or maybe hold for couple of days.    I recommend a potential allergy testing as if it's not lisinopril or from   something in the ice cream that she had eaten we need to determine the cause.    I suggest that she avoid knots in the interim as those are very common   allergens. She is follow-up with her primary care physician as needed      The patient was given the following medication in the Emergency department:      Medication Administration from 12/17/2016 0008 to 12/17/2016 0133      Date/Time Order Dose Route Action Action by Comments   12/17/2016 0048   famotidine (PEPCID) intravenous injection 20 mg 20 mg Intravenous Given   Noel GeroldKimberly S Murray   12/17/2016 0048 methylPREDNISolone sodium succinate   (SOLU-MEDROL) injection 125 mg 125 mg Intravenous Given Noel GeroldKimberly S Murray     12/17/2016 0048 diphenhydrAMINE (BENADRYL) capsule 25 mg 25 mg Oral Given   Noel GeroldKimberly S Murray           FINAL DIAGNOSIS:      1. Angioedema of lips, initial encounter           The patient was given the following medications to go home with.      New Prescriptions  METHYLPREDNISOLONE (MEDROL DOSPACK) 4 MG TBPK    Take by   mouth as per package directions.                Shirlee Latchennie, Jay, MD 12/17/16 0133           _________________________________  Signed by:   Mare LoanJAY  TENNIE   JAY  TENNIE     TE    D: 12/17/2016 12:36 AM  T: 12/17/2016 12:36 AM    This document is confidential medical information.  Unauthorized disclosure or   use of this information is prohibited by law.  If you are not the intended recipient of this document, please advise us by   calling immediately [phone].

## 2016-12-17 NOTE — Telephone Encounter (Signed)
I used tenoretic because it incorporates the diuretic she was on previously.  But replaces the lisinpril with atenolol.  We'll see how it's working and I am not against trying other medicines if that's what is needed.  Thanks.

## 2016-12-17 NOTE — Telephone Encounter (Signed)
Spoke with pt. Regarding new medication. She said she will call and see how much this medication cost. And will give the office a call if it is to expensive.

## 2016-12-17 NOTE — Telephone Encounter (Signed)
Pt will like a different blood pressure medication because the pt had an allergic reaction to something that she ate yesterday.      Please advise.    Pt CB# [phone]    Thanks.

## 2016-12-17 NOTE — Telephone Encounter (Signed)
Is there an alternative to this or can she wait until her Medicare starts in the new year.  This medication is $479.31 for 30 days out of pocket, and 71.99 for just 5 days worth of medication.  Pt can not afford this.    Please advise.

## 2016-12-17 NOTE — Telephone Encounter (Signed)
I have added lisinopril to her allergy list and called in tenoretic as an alternative.  Have her plan to follow up in 3-4 weeks for a bp check.    Thanks.

## 2016-12-17 NOTE — Telephone Encounter (Signed)
Holy cow!! I have never heard of this generic medicine costing this much.  [facility]    Will send in something different- the amlodipine in the 5mg  dose.  Thanks.

## 2016-12-17 NOTE — Other (Unsigned)
[facility] Encounter Arrival Date:   12/17/16 0008 [name]                            DOB: 17-May-1956 [id number]   [address] [place] [id number] MRN: [id number] CSN: [id number]     HAR:   [id number]      EMERGENCY DEPARTMENT -  Extremity NOTE      CHIEF COMPLAINT Chief Complaint Patient presents with   Allergic Reaction     pt is on Lisinopril and started with lip and tongue swelling and scratchy   throat about 1 hr ago      HPI [name] is a 60 year old female who presents swelling to her lower   lip. Start on the left side.  Although she reported to the nurses tongue   swelling she does not feel any currently to me she did have a bit of a   scratchy throat associated with this.  Started after eating some ice cream.    She has no known drug allergies.  She is on lisinopril which she's been on for   quite some time. Denies shortness breath nausea or vomiting.      REVIEW OF SYSTEMS Constitutional:  Denies fever or chills Eyes:  Denies   change in visual acuity HENT:  Denies nasal congestion or sore throat   Respiratory:  Denies cough or shortness of breath Cardiovascular:  Denies   chest pain or edema GI:  Denies abdominal pain, nausea, vomiting, bloody   stools or diarrhea GU:  Denies dysuria Musculoskeletal:  Denies back pain or   joint pain Integument:  Denies rash Neurologic:  Denies headache, focal   weakness or sensory changes Endocrine:  Denies polyuria or polydipsia   Lymphatic:  Denies swollen glands Psychiatric:  Denies depression or anxiety   See HPI for further details. All other review of systems otherwise negative      PAST MEDICAL HISTORY Past Medical History: Diagnosis Date   Asthma   Multiple   sclerosis (HCC)   Type II or unspecified type diabetes mellitus without   mention of complication, not stated as uncontrolled   Unspecified essential   hypertension      SURGICAL HISTORY Past Surgical History: Procedure Laterality Date     APPENDECTOMY    HX ECHOCARDIOGRAM   HX MYOVIEW CARDIAC STRESS TESTING     HYSTERECTOMY   KNEE SURGERY      CURRENT MEDICATIONS Prior to Admission medications Medication Sig Start Date   End Date Taking? Authorizing Provider pregabalin (LYRICA) 75 MG CAPS Take 75   mg by mouth 2 (two) times daily.   Yes HISTORICAL MED Cholecalciferol 50000   units CAPS Take 50,000 Units by mouth every 30 (thirty) days.   Yes HISTORICAL   MED lovastatin (MEVACOR) 40 MG TABS Take 40 mg by mouth at bedtime.   Yes   HISTORICAL MED ibuprofen (ADVIL,MOTRIN) 800 MG TABS Take 800 mg by mouth every   6 (six) hours as needed.   Yes HISTORICAL MED lisinopril-hydrochlorothiazide   20-25 (ZESTORETIC) combo dose Take  by mouth daily.   Yes HISTORICAL MED   oxybutynin (DITROPAN) 5 MG TABS Take 5 mg by mouth 2 (two) times daily.   Yes   HISTORICAL MED albuterol 108 (90 BASE) MCG/ACT AERS Use 2 puffs every 6 (six)   hours as needed. 03/25/14  Yes Metzger, Doristine SectionPaul Vincent, MD  interferon beta-1a   (REBIF) 44 MCG/0.5ML SOLN Use 44 mcg 3 (three) times a week. Yes HISTORICAL   MED baclofen (LIORESAL) 10 MG TABS Take 10 mg by mouth 3 (three) times daily.     Yes HISTORICAL MED metFORMIN (GLUCOPHAGE) 500 MG TABS Take 500 mg by mouth 2   (two) times daily with meals.   Yes HISTORICAL MED hydrocodone-acetaminophen   (NORCO) 5-325 MG TABS Take 1-2 tablets by mouth every 6 (six) hours as needed.   02/05/16   Cutter, Burlene Arnthristopher T., MD diazepam (VALIUM) 2 MG TABS Take 1 tablet   by mouth every 8 (eight) hours as needed (muscle spasm/pain). 02/05/16     Cutter, Burlene Arnthristopher T., MD sulfamethoxazole-trimethoprim (BACTRIM DS,SEPTRA   DS) 800-160 MG TABS Take 1 tablet by mouth 2 (two) times daily. 02/05/16   12/17/16  Cutter, Burlene Arnthristopher T., MD pravastatin (PRAVACHOL) 40 MG TABS Take   40 mg by mouth daily.  12/17/16 HISTORICAL MED gabapentin (NEURONTIN) 300 MG   CAPS Take 300 mg by mouth 3 (three) times daily. 12/17/16  HISTORICAL MED      ALLERGIES Allergies Allergen Reactions   Compazine  [Prochlorperazine] Muscle   Aches   Muscle spasm      FAMILY HISTORY Family History Problem Relation Age of Onset   Diabetes Father     Cancer Brother   Cancer Sister      SOCIAL HISTORY Social History      Social History   Marital status: Divorced   Spouse name: N/A   Number of   children: N/A   Years of education: N/A      Social History Main Topics   Smoking status: Never Smoker   Smokeless   tobacco: Never Used   Alcohol use Yes    Comment: occasional   Drug use: No     Sexual activity: Not Asked      Other Topics Concern   None      Social History Narrative   None      PHYSICAL EXAM VITAL SIGNS: BP 122/79    Pulse 74    Temp 98.2  F (36.8  C)   (Oral)    Resp 18   Ht 64" (162.6 cm)    Wt 99.8 kg (220 lb)    SpO2 97%      BMI 37.76 kg/m2 Constitutional:  Well developed, well nourished, no acute   distress, non-toxic appearance HENT:  Atraumatic, external ears normal, nose   normal, oropharynx moist.  Neck- normal range of motion, no tenderness, supple   swelling one small area air on the left lower lip as well as earlier in the   right lower lip.  No tongue swelling Respiratory:  No respiratory distress,   normal breath sounds. Cardiovascular:  Normal rate, normal rhythm, no murmurs,   no gallops, no rubs GI:  Soft, nondistended, normal bowel sounds, nontender   Musculoskeletal:  No edema, no tenderness, no deformities. Back- no tenderness   Integument:  Well hydrated, no rash Neurologic:  Alert & oriented x 3, CN   2-12 normal, normal motor function, normal sensory function, no focal deficits   noted      LABORATORY No results found for this or any previous visit (from the past 24   hour(s)).      RADIOLOGY No orders to display      PROCEDURES      ED COURSE & MEDICAL DECISION MAKING      Patient symptoms improved with  Solu-Medrol Pepcid and Benadryl.  I think this   is more an allergic reaction than actually an angioedema secondary to ace   inhibitors as he she did have improvement.  He did however nonetheless  suggest   that she hold her lisinopril until she can talk to her primary care physician   she is going to either need a replacement or maybe hold for couple of days.    I recommend a potential allergy testing as if it's not lisinopril or from   something in the ice cream that she had eaten we need to determine the cause.    I suggest that she avoid knots in the interim as those are very common   allergens. She is follow-up with her primary care physician as needed      The patient was given the following medication in the Emergency department:      Medication Administration from 12/17/2016 0008 to 12/17/2016 0133      Date/Time Order Dose Route Action Action by Comments   12/17/2016 0048   famotidine (PEPCID) intravenous injection 20 mg 20 mg Intravenous Given   Noel GeroldKimberly S Murray   12/17/2016 0048 methylPREDNISolone sodium succinate   (SOLU-MEDROL) injection 125 mg 125 mg Intravenous Given Noel GeroldKimberly S Murray     12/17/2016 0048 diphenhydrAMINE (BENADRYL) capsule 25 mg 25 mg Oral Given   Noel GeroldKimberly S Murray           FINAL DIAGNOSIS:      1. Angioedema of lips, initial encounter           The patient was given the following medications to go home with.      New Prescriptions  METHYLPREDNISOLONE (MEDROL DOSPACK) 4 MG TBPK    Take by   mouth as per package directions.                Shirlee Latchennie, Jay, MD 12/17/16 [id number]      Shirlee Latchennie, Jay, MD 12/17/16 0134           _________________________________  Signed by:   Mare LoanJAY  TENNIE   JAY  TENNIE     TE    D: 12/17/2016 12:36 AM  T: 12/17/2016 12:36 AM    This document is confidential medical information.  Unauthorized disclosure or   use of this information is prohibited by law.  If you are not the intended recipient of this document, please advise us by   calling immediately [phone].

## 2016-12-17 NOTE — Other (Unsigned)
CURRENT STATUS IS EMERGENCY .       :      Any questions regarding PATIENT CLASS/STATUS should be directed to the Care   Management Departments: [facility] [phone] or [facility] [phone] or [facility]  [phone].           _________________________________  Signed byJamison Neighbor:        [facility]  NOTIFY     ZZ    D: 12/17/2016 12:08 AM  T:     This document is confidential medical information.  Unauthorized disclosure or   use of this information is prohibited by law.  If you are not the intended recipient of this document, please advise us by   calling immediately [phone].

## 2016-12-18 MED ORDER — AMLODIPINE BESYLATE 5 MG PO TABS
5 | ORAL_TABLET | Freq: Every day | ORAL | 3 refills | Status: DC
Start: 2016-12-18 — End: 2017-01-12

## 2016-12-29 NOTE — Telephone Encounter (Signed)
Rep fr [employer] PA dept is calling to speak with someone regarding needing more info for oxybutynin PA. Faxing a form over. Pls advise.

## 2016-12-29 NOTE — Telephone Encounter (Signed)
Awaiting the PA form to obtain the PA for her medication

## 2016-12-29 NOTE — Telephone Encounter (Signed)
Pt would like to speak with MA regarding her medications, states since she switched insurance companies her medication cost has increased, would like to know can the physician write a script for a more affordable medication, also states she is currently out of medication. Please advise pt.

## 2016-12-29 NOTE — Telephone Encounter (Signed)
Spoke with patient-advised that I did called [employer] for a cost reduction for her Lyrica-complete

## 2017-01-03 MED ORDER — oxybutynin (DITROPAN-XL) 10 MG 24 hr tablet
10 | ORAL_TABLET | Freq: Two times a day (BID) | ORAL | 3 refills | Status: AC
Start: 2017-01-03 — End: 2017-07-14

## 2017-01-03 NOTE — Unmapped (Signed)
Addended by: Juanita Laster D on: 01/03/2017 02:43 PM     Modules accepted: Orders

## 2017-01-03 NOTE — Telephone Encounter (Signed)
Patient requesting a 90 day supply of the Oxybutynin 10 mg 2 po qhs faxed to [phone]

## 2017-01-03 NOTE — Unmapped (Signed)
Addended by: Kristie Cowman on: 01/03/2017 04:09 PM     Modules accepted: Orders

## 2017-01-03 NOTE — Unmapped (Signed)
Pt calling to speak with MA regarding her medication update pls advise

## 2017-01-04 NOTE — Telephone Encounter (Signed)
Pt requests callback from MA regarding her med list, would not elaborate.

## 2017-01-05 MED ORDER — pregabalin (LYRICA) 75 MG capsule
75 | ORAL_CAPSULE | Freq: Two times a day (BID) | ORAL | 5 refills | Status: AC
Start: 2017-01-05 — End: 2017-02-04

## 2017-01-05 MED ORDER — ibuprofen (ADVIL,MOTRIN) 800 MG tablet
800 | ORAL_TABLET | Freq: Three times a day (TID) | ORAL | 0 refills | Status: AC | PRN
Start: 2017-01-05 — End: 2017-02-04

## 2017-01-05 NOTE — Unmapped (Signed)
Addended by: Juanita Laster D on: 01/05/2017 11:59 AM     Modules accepted: Orders

## 2017-01-05 NOTE — Unmapped (Signed)
Addended by: Juanita Laster D on: 01/05/2017 11:55 AM     Modules accepted: Orders

## 2017-01-12 MED ORDER — LOVASTATIN 40 MG PO TABS
40 | ORAL_TABLET | ORAL | 1 refills | Status: DC
Start: 2017-01-12 — End: 2017-03-31

## 2017-01-12 MED ORDER — AMLODIPINE BESYLATE 5 MG PO TABS
5 | ORAL_TABLET | Freq: Every day | ORAL | 1 refills | Status: DC
Start: 2017-01-12 — End: 2017-04-04

## 2017-01-12 MED ORDER — BACLOFEN 10 MG PO TABS
10 | ORAL_TABLET | ORAL | 1 refills | Status: DC
Start: 2017-01-12 — End: 2017-07-13

## 2017-01-12 NOTE — Telephone Encounter (Signed)
Very good.  meds refilled. (not the tenoretic)  Thanks.

## 2017-01-12 NOTE — Telephone Encounter (Signed)
Pt's chart states that the Atenolol-chlorthalidone was d/c due to cost.

## 2017-01-12 NOTE — Telephone Encounter (Signed)
baclofen (LIORESAL) 10 MG tablet     This needs to be for a 3 month supply    amLODIPine (NORVASC) 5 MG tablet     This needs to be a 90 day     atenolol-chlorthalidone (TENORETIC) 50-25 MG per tablet     This needs to be for 6 months supply    lovastatin (MEVACOR) 40 MG tablet     This needs to be for 3 month supply     Pt needs this to go to:  [facility] fax [phone]  Phone [phone]

## 2017-01-14 NOTE — Telephone Encounter (Signed)
Spoke with pt on Thurs. 01/13/17, did not understand how atenolol- chlorthalidone wasn't filled for her and why it was discontinued. Read back to the pt the conversation from 12/22 about the cost of the medication and how it switched with amlodipine.    Pt stated that she has been taking both medications that she filled the last script and just paid he high cost filling that she was suppose to take something until it was decided if there was a cheaper alternative.    Pt has been taking both medications and has described symptoms lightheadedness, shakey and nasuea for last 3 weeks.Advise to have the pt come in with medications to sort out all that she should and shouldn't be taking.  Advised by the office manager to end phone call and informed pt I would contact her in the morning.        Called pt. To follow up today 01/14/17  Dr. Briant CedarMattingly advised that she come it for BP check to see what her blood pressure is while she has been on both.    Pt stated that she stopped taking the combination Rx (Tenoretic) after our conversation but will come in for BP check on Wednesday 01/19/17.

## 2017-01-14 NOTE — Telephone Encounter (Signed)
amLODIPine (NORVASC) 5 MG tablet     Wants to make sure that the  is what she is suppose to be taking.    Per Lauren this is the equivalence to the  of the other medication that was discontinue.    I informed the patient of this and she is understands

## 2017-02-04 NOTE — Other (Unsigned)
CURRENT STATUS IS EMERGENCY .       :      Any questions regarding PATIENT CLASS/STATUS should be directed to the Care   Management Departments: [facility] [phone] or [facility] [phone] or [facility]  [phone].           _________________________________  Signed byJamison Neighbor  NOTIFY     ZZ    D: 02/04/2017 07:58 AM  T:     This document is confidential medical information.  Unauthorized disclosure or   use of this information is prohibited by law.  If you are not the intended recipient of this document, please advise [place] by   calling immediately [phone].

## 2017-02-04 NOTE — Other (Unsigned)
EMERGENCY DEPARTMENT - General NOTE      CHIEF COMPLAINT Chief Complaint Patient presents with   Shortness of Breath      HPI Danielle Miles is a 61 year old female who presents with shortness of   breath. History of asthma (Hx of MS also noted).  Occasional dry cough.  No   fever or chills.  No chest discomfort.  No GI or GU symptoms. No leg swelling   or calf pain.  Symptoms noted over the past few days.  No other complaints.      Nursing Triage note: Pt c/o shortness of breath for a few days. Has been   using albuterol. States " I think I need a breathing treatment."      PAST MEDICAL HISTORY Past Medical History: Diagnosis Date   Asthma   Multiple   sclerosis (HCC)   Type II or unspecified type diabetes mellitus without   mention of complication, not stated as uncontrolled   Unspecified essential   hypertension      SURGICAL HISTORY Past Surgical History: Procedure Laterality Date     APPENDECTOMY   HX ECHOCARDIOGRAM   HX MYOVIEW CARDIAC STRESS TESTING     HYSTERECTOMY   KNEE SURGERY      CURRENT MEDICATIONS Current Outpatient Prescriptions Medication Sig Dispense   Refill   amlodipine (NORVASC) 10 MG TABS Take 5 mg by mouth daily.     azithromycin (ZITHROMAX) 500 MG TABS Take 1 tablet by mouth daily for 5 days.   5 tablet 0   guaifenesin-codeine (TUSSI-ORGANIDIN NR) 100-10 MG/5ML SYRP Take   10 mLs by mouth every 4 to 6 hours as needed (cough) for up to 3 days. 240 mL   0   albuterol 108 (90 Base) MCG/ACT AERS Use 2 puffs every 6 (six) hours as   needed. 1 inhaler 0   pregabalin (LYRICA) 75 MG CAPS Take 75 mg by mouth 2   (two) times daily.   Cholecalciferol 50000 units CAPS Take 50,000 Units by   mouth once a week.   lovastatin (MEVACOR) 40 MG TABS Take 40 mg by mouth at   bedtime.   ibuprofen (ADVIL,MOTRIN) 800 MG TABS Take 800 mg by mouth every 6   (six) hours as needed.   oxybutynin (DITROPAN) 5 MG TABS Take 5 mg by mouth 2   (two) times daily.   albuterol 108 (90 BASE) MCG/ACT AERS Use 2 puffs every 6    (six) hours as needed. 1 inhaler 0   interferon beta-1a (REBIF) 44 MCG/0.5ML   SOLN Use 44 mcg 3 (three) times a week.   baclofen (LIORESAL) 10 MG TABS Take   10 mg by mouth 3 (three) times daily.   metFORMIN (GLUCOPHAGE) 500 MG TABS   Take 500 mg by mouth 2 (two) times daily with meals.      ALLERGIES Allergies Allergen Reactions   Compazine [Prochlorperazine] Muscle   Aches   Muscle spasm   Lisinopril Angioedema      FAMILY HISTORY Family History Problem Relation Age of Onset   Diabetes Father     Cancer Brother   Cancer Sister      SOCIAL HISTORY Social History      Social History   Marital status: Divorced   Spouse name: N/A   Number of   children: N/A   Years of education: N/A      Social History Main Topics   Smoking status: Never Smoker   Smokeless  tobacco: Never Used   Alcohol use Yes    Comment: occasional   Drug use: No     Sexual activity: Not Asked      Other Topics Concern   None      Social History Narrative   None      REVIEW OF SYSTEMS See HPI for further details No other complaints. All other   systems have been reviewed and are negative except as noted in HPI.      PHYSICAL EXAM VITAL SIGNS: BP (!) 161/102 (Patient Position: Semi-Fowlers)      Pulse 96    Temp 98.3  F (36.8  C) (Oral)    Resp 24    Ht 64" (162.6 cm)      Wt 99.8 kg (220 lb)   SpO2 99%    BMI 37.76 kg/m2 Constitutional:  No acute   distress. Mildly obese body habitus.  Alert. HEENT:  Normocephalic,   Atraumatic.      Posterior pharynx non-inflamed.      No facial droop; normal   speech      Neck -- Supple. No JVD. Eyes:  EOMI. No Icterus. Respiratory:   No   significant tachypnea at rest.  There is mild expiratory wheezing. No   significant crackles.  Breathing is unlabored.  She is moving air well.   Cardiovascular:  Normal heart rate and normal rhythm.  No significant murmurs.   GI:  Soft.  There is no significant abdominal tenderness; no flank   tenderness. No obvious masses or distention. No significant organomegaly.    Musculoskeletal:  No significant lower leg edema.  No calf tenderness. No   obvious ischemic changes. Good range of motion of both arms and both legs.   Skin:  Warm, dry, no significant erythema.  No obvious rashes or lesions -- I   did not examine the sacral region. Lymphatic:  No significant lymphadenopathy   noted. Neurologic:  Alert.  There are no obvious focal deficits noted.  She   walked with a steady gait in the ED.  No significant tremor. Psychiatric:    Affect grossly normal.  Mood grossly normal.      EKG: Sinus rhythm. Rate in the 90s.  Narrow QRS. No ectopy.  No concerning ST   or T-wave changes. Similar to 12/04/2014 study.      LABORATORY Recent Results (from the past 12 hour(s)) POCT Bedside Glucose    Collection Time: 02/04/17  8:23 AM [name] Value Ref Range  BEDSIDE GLUCOSE 116   (H) 70 - 99 mg/dL ECG 12 lead  Collection Time: 02/04/17  8:26 AM [name]   Value Ref Range  Height  in  HEART RATE 97 bpm  RR INTERVAL 619 ms  ATRIAL   RATE 96 ms  P-R Interval 139 ms  P Duration 99 ms  P HORIZONTAL -11 deg  P   FRONT AXIS 58 deg  Q Onset 509 ms  QRSD Interval 75 ms  QT INTERVAL 342 ms    QTCB 435 ms  QTcF 401 ms  QRS Horizontal Axis -26 deg  QRS AXIS 40 deg  I-40   Horizontal Axis 26 deg  I-40 FRONT AXIS 5 deg  T-40 Horizontal Axis -38 deg    T-40 Front Axis 58 deg  T Horizontal Axis 47 deg  T WAVE AXIS 55 deg  S-T   Horizontal Axis 87 deg  S-T Front Axis 65 deg  ECG Severity - NORMAL ECG -  ECG Severity SR-Sinus rhythm-normal P axis, V-rate 50-99  ECGGUID   [id number]           RADIOLOGY XR CHEST PA AND LATERAL Final [name]      No significant abnormality                     ED COURSE & MEDICAL DECISION MAKING: DuoNeb given.  Prednisone  PO given.   Testing noted above.  She felt much better after the nebulizer treatment.    After all testing and treatment, I discussed plan of care with her.  She   ambulated well in the ED with a brisk gait -- no significant dyspnea or    hypoxia (mild tachycardia was noted).   Okay for discharge.  PRN return with   any concerns or worsening symptoms.  Home with azithromycin (to be used only   with onset of productive cough or fever), Robitussin AC, and an Albuterol MDI.    I od not feel that she needs steroids (hx of DM noted, we discussed this).   Follow-up with PCP as needed.       Final Impression: 1. Acute bronchitis      Disposition: Stable at discharge      [facility] Emergency Department ED Encounter Arrival Date:   02/04/17 0758 Danielle Miles                            DOB: [birth year] [address] [place] [address] MRN: [id number] CSN: [id number]     HAR:   [id number]           Domingo Dimes, MD 02/09/17 0803           _________________________________  Signed by:   Arvella Nigh Domingo Dimes MORRISS     MO    D: 02/04/2017 09:43 AM  T: 02/04/2017 09:43 AM    This document is confidential medical information.  Unauthorized disclosure or   use of this information is prohibited by law.  If you are not the intended recipient of this document, please advise [place] by   calling immediately [phone].

## 2017-02-09 NOTE — Addendum Note (Signed)
Encounter addended by: Janeann Merl on: 02/09/2017  6:07 PM<BR>    Actions taken: Sign clinical note

## 2017-02-09 NOTE — Telephone Encounter (Signed)
I may have refilled an order for albuterol at her request in the past, but at no time have we actually discussed any lung diseases she may or may not have, from my review of her chart.    If she cannot breathe, she should call 911.  Otherwise, we would need to see her here to see what is actually going on with her lungs and the correct treatment is given.

## 2017-02-09 NOTE — Telephone Encounter (Signed)
???   I reviewed her chart.  She has no hx of lung problems.  No hx of asthma.  [facility] and was dx with [facility].  If she is not getting better or her lungs are not improving yet she needs to be seen here.  Can see Toni AmendCourtney today or Dr Day or Joaquin CourtsPomerleau.

## 2017-02-09 NOTE — Telephone Encounter (Signed)
Called pt with information from dr Briant Cedar and she does want to schedule.  She will call back to schedule she was in the middle of something to do it right now.

## 2017-02-09 NOTE — Telephone Encounter (Signed)
Pt states she's been using this inhaler for over a year, it looks like Dr. Briant CedarMattingly prescribed it in 08/2015? She states she is having SOB and would like a nebulizer sent in. This isn't in regards to her ER visit.

## 2017-02-09 NOTE — Telephone Encounter (Signed)
Spoke with patient and states that she was offered an appt fro 2 weeks from now. Advised of Dr.mattingly's response and made an appt with Dr.Day on 02/10/2017 at 830am. Aplogized  to patient about not being offered an Same day appt or an apporatie message being sent back to the pool. Patient advised to return to clinic or go the ER if symptoms do not improve or worsen. Patient verbalizes their understanding on Dr's advice.

## 2017-02-09 NOTE — Telephone Encounter (Signed)
Pt called back to schedule an appointment. (2hrs after she hung up on Renee stating she was in the middle of something right now)   I told her the next appointment Dr. Briant Cedar had was on 2/27 but we could get her in with [facility] tomorrow.      She got upset and said she doesn't understand that if she cant breath why we aren't getting her in sooner. And she refused to see a NP.  And then she said she would call me back and then hung up on me.

## 2017-02-09 NOTE — Telephone Encounter (Signed)
Pt is requesting that the doctor prescribe her the nebulizer machine for her asthma. The pt said the inhaler isn't doing the job by itself.    Please advise pt.  CB# [phone]    Thanks.

## 2017-02-10 ENCOUNTER — Ambulatory Visit: Admit: 2017-02-10 | Discharge: 2017-02-10 | Payer: MEDICARE | Attending: Family Medicine

## 2017-02-10 DIAGNOSIS — J40 Bronchitis, not specified as acute or chronic: Secondary | ICD-10-CM

## 2017-02-10 MED ORDER — GUAIFENESIN ER 600 MG PO TB12
600 MG | ORAL_TABLET | Freq: Two times a day (BID) | ORAL | 0 refills | Status: DC
Start: 2017-02-10 — End: 2017-04-12

## 2017-02-10 NOTE — Telephone Encounter (Signed)
Thanks for helping her get in!

## 2017-02-10 NOTE — Progress Notes (Signed)
02/10/2017    This is a 61 y.o. female   Chief Complaint   Patient presents with   ??? Wheezing     HPI   Here for concern for cough and wheezing. Started 2-3 weeks ago.     Went to [facility] ED on 02/04/17 and had a normal EKG and a normal CXR, given DuoNeb which she felt helped. Also given 30m prednisone in the ED.  - She was discharged with azithromycin, albuterol, and cough medication. She started these yesterday.     There is a ?diagnosis of asthma.     Here because she feels her coughing and wheezing were getting better and worsened again. Endorses being SOB when walking longer distances. Denies chest pain.  - No history of blood clots or leg swelling. Was off her feet some recently with the flu. No fever or chills recently.    No hx of DVT or leg swelling.    Review of Systems   Constitutional: Negative for chills and fever.   HENT: Positive for congestion, postnasal drip and rhinorrhea.    Respiratory: Positive for cough and shortness of breath.    Cardiovascular: Negative for chest pain.   Gastrointestinal: Negative for constipation, diarrhea and nausea.       Patient Active Problem List   Diagnosis   ??? HTN (hypertension)   ??? Type 2 diabetes mellitus ([place] (dx 2013)   ??? Overactive bladder- related to MS   ??? Multiple sclerosis ([place] ~[facility]; [facility]  ??? Fibromyalgia- dx by DR FCandace Gallus   ??? OSA (obstructive sleep apnea)- has CPAP, but not using.   ??? Hyperlipidemia   ??? Mild nonproliferative diabetic retinopathy without macular edema associated with type 2 diabetes mellitus ([place] Dr BBurke Keels  ??? S/P colonoscopic polypectomyx5; 06/16/15; DR CSherlyn Lick       Past Medical History:   Diagnosis Date   ??? Difficult intubation    ??? HTN (hypertension) 07/15/2014   ??? Multiple sclerosis ([place]- dx ~2000 07/15/2014   ??? Type 2 diabetes mellitus ([place] (dx 2013) 07/15/2014       Past Surgical History:   Procedure Laterality Date   ??? ANTERIOR CRUCIATE LIGAMENT REPAIR     ??? APPENDECTOMY     ??? COLONOSCOPY  06/16/2015    Colon Polyps     ??? HYSTERECTOMY      due to prolapse       Social History     Social History   ??? Marital status: Single     Spouse name: N/A   ??? Number of children: 2   ??? Years of education: N/A     Occupational History   ??? [place]- [place] was OR clerk at [employer]   ??? Smoking status: Never Smoker   ??? Smokeless tobacco: Never Used   ??? Alcohol use Yes      Comment: occ mixed drinks.   ??? Drug use: No   ??? Sexual activity: Not Currently     Partners: Male     Other Topics Concern   ??? Not on file     Social History Narrative    Lives with daugher, DDarron Doom      Family History   Problem Relation Age of Onset   ??? Cancer Mother 748    brain   ??? Cancer Father 726    lung cancer  Current Outpatient Prescriptions   Medication Sig Dispense Refill   ??? guaiFENesin (MUCINEX) 600 MG extended release tablet Take 1 tablet by mouth 2 times daily 30 tablet 0   ??? baclofen (LIORESAL) 10 MG tablet TAKE ONE TABLET BY MOUTH THREE TIMES A Alleen Kehm 270 tablet 1   ??? amLODIPine (NORVASC) 5 MG tablet Take 1 tablet by mouth daily 90 tablet 1   ??? lovastatin (MEVACOR) 40 MG tablet TAKE ONE TABLET BY MOUTH DAILY 90 tablet 1   ??? metFORMIN (GLUCOPHAGE) 1000 MG tablet TAKE ONE TABLET BY MOUTH TWICE A Hezekiah Veltre WITH MEALS 180 tablet 1   ??? Blood Glucose Monitoring Suppl (ACCU-CHEK NANO SMARTVIEW) W/DEVICE KIT Use once daily. 1 kit 0   ??? PROVENTIL HFA 108 (90 BASE) MCG/ACT inhaler INHALE TWO PUFFS BY MOUTH EVERY 6 HOURS AS NEEDED FOR WHEEZING OR FOR SHORTNESS OF BREATH 1 Inhaler 0   ??? glucose blood VI test strips (ACCU-CHEK AVIVA) strip 1 each by In Vitro route daily E11.9  Accucheck Nano 100 each 0   ??? oxybutynin (DITROPAN-XL) 10 MG CR tablet Take 10 mg by mouth nightly      ??? Cholecalciferol (VITAMIN D3) 50000 UNITS CAPS Take by mouth once a week     ??? pravastatin (PRAVACHOL) 40 MG TABS Take 40 mg by mouth every evening 30 tablet 5   ??? pregabalin (LYRICA) 75 MG capsule Take 75 mg by mouth       No current facility-administered medications  for this visit.        Allergies   Allergen Reactions   ??? Lisinopril Swelling   ??? Compazine [Prochlorperazine]        BP 126/88 (Site: Right Arm, Position: Sitting, Cuff Size: Large Adult)    Pulse 82    Temp 97.2 ??F (36.2 ??C) (Oral)    Resp 20    Ht 5' 4"  (1.626 m)    Wt 240 lb (108.9 kg)    SpO2 97%    BMI 41.20 kg/m??     Physical Exam   Constitutional: She appears well-developed and well-nourished.   HENT:   Head: Normocephalic and atraumatic.   TMs normal  oropharynx moist without erythema and mild post-nasal drip   Eyes: Conjunctivae are normal.   Neck: Normal range of motion. Neck supple.   Cardiovascular: Normal rate, regular rhythm and normal heart sounds.    No murmur heard.  Pulmonary/Chest: Effort normal and breath sounds normal. She has no wheezes. She has no rales.   Lymphadenopathy:     She has no cervical adenopathy.   Skin: Skin is warm. No rash noted.   Psychiatric: She has a normal mood and affect.       Wt Readings from Last 3 Encounters:   02/10/17 240 lb (108.9 kg)   10/27/16 233 lb (105.7 kg)   10/21/15 217 lb (98.4 kg)       BP Readings from Last 3 Encounters:   02/10/17 126/88   10/27/16 124/86   10/21/15 130/80     Assessment/Plan:  Kalynn was seen today for wheezing.    Diagnoses and all orders for this visit:    Bronchitis  Normal lung exam. No increased work of breathing, normal vitals. Advised pt to finish antibiotic therapy given by ED. Add mucinex PRN  -     guaiFENesin (MUCINEX) 600 MG extended release tablet; Take 1 tablet by mouth 2 times daily    Shortness of breath  Low risk for DVT/PE, pt has concern  about her SOB. No leg swelling noted. Check d-dimer to rule out.  -     D-DIMER, QUANTITATIVE; Future      Return if symptoms worsen or fail to improve.

## 2017-02-10 NOTE — Telephone Encounter (Signed)
Katrina / Air traffic controller. Called to set up delivery for the pt's Rebif. States she was unable to read the pt's information, and was unable to successfully recite the pt's phone number or address.

## 2017-02-11 NOTE — Telephone Encounter (Signed)
Spoke with pt advised her to contact [facility] to set up delivery.

## 2017-02-18 NOTE — Telephone Encounter (Signed)
Rep fr [facility] is calling to request script for Rebif.

## 2017-02-18 NOTE — Telephone Encounter (Signed)
Spoke with pharmacist Renae Fickle gave verbal order for Rebif.

## 2017-03-13 NOTE — Other (Unsigned)
[facility] Emergency Department ED Encounter Arrival Date:   03/13/17 0220 Danielle Miles                            DOB: 03-13-1956 [address] [place] [address] MRN: [id number] CSN: [id number]     HAR:   [id number]      EMERGENCY DEPARTMENT -  Extremity NOTE      CHIEF COMPLAINT Chief Complaint Patient presents with   Leg Swelling      HPI Danielle Miles is a 61 year old female who presents with bilateral lower   extremity edema area patient states this started past couple of days.  Asked   to got a little bit better when she propped up her legs but then came back   didn't seem to get much better today she has some itching noted around the   legs as well she's had no fever no chills no nausea no vomiting no diaphoresis   denies chest pain denies shortness of breath she has been on amlodipine now   for the better part of a month maybe 2 months.  No other new medications.      REVIEW OF SYSTEMS Constitutional:  Denies fever or chills Eyes:  Denies   change in visual acuity HENT:  Denies nasal congestion or sore throat   Respiratory:  Denies cough or shortness of breath Cardiovascular:  Denies   chest pain GI:  Denies abdominal pain, nausea, vomiting, bloody stools or   diarrhea GU:  Denies dysuria Musculoskeletal:  Denies back pain or joint pain   Integument:  Denies rash Neurologic:  Denies headache, focal weakness or   sensory changes Endocrine:  Denies polyuria or polydipsia Lymphatic:  Denies   swollen glands Psychiatric:  Denies depression or anxiety See HPI for further   details. All other review of systems otherwise negative      PAST MEDICAL HISTORY Past Medical History: Diagnosis Date   Asthma   Multiple   sclerosis (HCC)   Type II or unspecified type diabetes mellitus without   mention of complication, not stated as uncontrolled   Unspecified essential   hypertension      SURGICAL HISTORY Past Surgical History: Procedure Laterality Date     APPENDECTOMY   HX ECHOCARDIOGRAM    HX MYOVIEW CARDIAC STRESS TESTING     HYSTERECTOMY   KNEE SURGERY      CURRENT MEDICATIONS Prior to Admission medications Medication Sig Start Date   End Date Taking? Authorizing Provider amlodipine (NORVASC) 10 MG TABS Take 5   mg by mouth daily.   Yes HISTORICAL MED pregabalin (LYRICA) 75 MG CAPS Take 75   mg by mouth 2 (two) times daily.   Yes HISTORICAL MED Cholecalciferol 50000   units CAPS Take 50,000 Units by mouth once a week.   Yes HISTORICAL MED   lovastatin (MEVACOR) 40 MG TABS Take 40 mg by mouth at bedtime.   Yes   HISTORICAL MED ibuprofen (ADVIL,MOTRIN) 800 MG TABS Take 800 mg by mouth every   6 (six) hours as needed.   Yes HISTORICAL MED oxybutynin (DITROPAN) 5 MG TABS   Take 5 mg by mouth 2 (two) times daily.   Yes HISTORICAL MED albuterol 108   (90 BASE) MCG/ACT AERS Use 2 puffs every 6 (six) hours as needed. 03/25/14  Yes   Gardiner Sleeper, MD interferon beta-1a (REBIF) 44 MCG/0.5ML SOLN Use  44   mcg 3 (three) times a week. Yes HISTORICAL MED baclofen (LIORESAL) 10 MG TABS   Take 10 mg by mouth 3 (three) times daily.   Yes HISTORICAL MED metFORMIN   (GLUCOPHAGE) 500 MG TABS Take 500 mg by mouth 2 (two) times daily with meals.     Yes HISTORICAL MED albuterol 108 (90 Base) MCG/ACT AERS Use 2 puffs every 6   (six) hours as needed. 02/04/17   Morriss, Arvella Nigh, MD      ALLERGIES Allergies Allergen Reactions   Compazine [Prochlorperazine] Muscle   Aches   Muscle spasm   Lisinopril Angioedema      FAMILY HISTORY Family History Problem Relation Age of Onset   Diabetes Father     Cancer Brother   Cancer Sister      SOCIAL HISTORY Social History      Social History   Marital status: Divorced   Spouse name: N/A   Number of   children: N/A   Years of education: N/A      Social History Main Topics   Smoking status: Never Smoker   Smokeless   tobacco: Never Used   Alcohol use Yes    Comment: occasional   Drug use: No     Sexual activity: Not Asked      Other Topics Concern   None      Social History  Narrative   None      PHYSICAL EXAM VITAL SIGNS: BP 138/87    Pulse 89    Temp 97.6  F (36.4  C)   (Oral)    Resp 18   Ht 64" (162.6 cm)    Wt 99.8 kg (220 lb)    SpO2 95%      BMI 37.76 kg/m2 Constitutional:  Well developed, well nourished, no acute   distress, non-toxic appearance HENT:  Atraumatic, external ears normal, nose   normal, oropharynx moist.  Neck- normal range of motion, no tenderness, supple   Respiratory:  No respiratory distress, normal breath sounds. Cardiovascular:    Normal rate, normal rhythm, no murmurs, no gallops, no rubs GI:  Soft,   nondistended, normal bowel sounds, nontender Musculoskeletal:  , no   tenderness, no deformities. Back- no tenderness or lower extremity slightly   reddened pitting edema noted warm and good sensation Integument:  Well   hydrated, no rash Neurologic: Alert & oriented x 3, CN 2-12 normal, normal   motor function, normal sensory function, no focal deficits noted      LABORATORY Recent Results (from the past 24 hour(s)) BMP  Collection Time:   03/13/17  2:53 AM Danielle Miles Value Ref Range  BLD UREA NITROGEN 13 8 - 26 mg/dL    SODIUM 454 098 - 119 mEq/L  POTASSIUM 4.3 3.6 - 5.1 mEq/L  CHLORIDE 102 101 -   111 mEq/L  CO2 26 24 - 36 mEq/L  GLUCOSE, RANDOM 184 (H) 70 - 99 mg/dL    CREATININE 1.47 8.29 - 1.03 mg/dL  OSMOLALITY CALC 562 (L) 280 - 300 mOSM/kg    CALCIUM 8.9 8.5 - 10.5 mg/dL  GFR NON-AFRICAN AMER >13 >60 mL/min/1.73 sq   meter  GFR AFRICAN AMERICAN >60 >60 mL/min/1.73 sq meter CBC w/ Diff    Collection Time: 03/13/17  2:53 AM Danielle Miles Value Ref Range  WBC 9.8 3.6 - 10.5   THOU/mcL  RBC 4.42 3.80 - 5.20 MIL/mcL  HEMOGLOBIN 13.3 12.0 - 15.2 g/dL    HEMATOCRIT 40 36 - 46 %  MCV 91 82 - 97 fL  MCH 30 27 - 33 pg  MCHC 33 32 - 36   g/dL  RDW 16.1 <09.6 %  PLATELET 221 140 - 375 THOU/mcL  SEGS 62 %    LYMPHOCYTES 31 %  MONOCYTES 5 %  EOSINOPHIL 2 %  BASOPHILS 0 %  ABS.   NEUTROPHIL 6.10 1.80 - 7.70 THOU/mcL  ABS LYMPHS 3.10 1.00 - 4.00 THOU/mcL    ABS MONOS 0.50 0.20 -  0.90 THOU/mcL  ABS EOS 0.10 0.03 - 0.45 THOU/mcL  ABS   BASOS 0.00 0.00 - 0.20 THOU/mcL B Natriuretic Peptide  Collection Time:   03/13/17  2:53 AM Norberto Wishon Value Ref Range  B NATRIURETIC PEP 21 0 - 71 pg/mL   Urine Microscopic  Collection Time: 03/13/17  2:54 AM Addie Alonge Value Ref Range    RBC, URINE 4 to 7 0 - 5 /HPF  WBC, URINE 11 to 20 0 - 3 /HPF  BACTERIA PRESENT   /HPF  TRANS EPITHELIALS 4 to 7 0 /HPF  SQUAMOUS EPI CELLS 0 to 3 /HPF  OTHER   OBSERVATIONS MUCUS PRESENT Urinalysis with Reflex to Culture  Collection Time:   03/13/17  2:54 AM Ayva Veilleux Value Ref Range  UA SPECIMEN SOURCE URINE   UNSPECIFIED  COLOR YELLOW YELLOW  APPEARANCE, URINE CLOUDY (A) CLEAR  PH,   URINE 6.0 5.0 - 8.0  SPEC. GRAVITY, URINE 1.025 1.005 - 1.029  PROTEIN, URINE   1+ (A) NEGATIVE  GLUCOSE, URINE NEGATIVE NEGATIVE  KETONE, URINE NEGATIVE   NEGATIVE  BILIRUBIN, URINE NEGATIVE NEGATIVE  BLOOD, URINE NEGATIVE NEGATIVE    UROBILINOGEN, URINE 0.2 <2.0 mg/dL  NITRITE, URINE NEGATIVE NEGATIVE    LEUKOCYTE ESTERASE, URINE 1+ (A) NEGATIVE      RADIOLOGY No orders to display           ED COURSE & MEDICAL DECISION MAKING      Patient does have edema is likely dependent edema although she is on   amlodipine which can cause some peripheral edema she's been on for a while   some not so is the culprit.  Any chest pain or any shortness of breath or BNP   is normal.  Her electrolytes are normal and her urine does have a small amount   of protein however this is not new.  She'll be given aLasix prescription she   is keep her legs elevated, care physician considering the discontinuation of   the amlodipine I suspect it's not related since she's been on it for quite   some time.      The patient was given the following medication in the Emergency department:      Medication Administration from 03/13/2017 0220 to 03/13/2017 0410  None           FINAL DIAGNOSIS:      1. Bilateral lower extremity edema           The patient was given the following medications  to go home with.      Discharge Medication List as of 03/13/2017  3:53 AM      START taking these medications  Details furosemide (LASIX) 20 MG TABS Take 1   tablet by mouth daily as needed.Print, Disp-10 tablet, R-0Indications: Edema                     Shirlee Latch, MD 03/13/17 [phone]           _________________________________  Signed by:  JAY  TENNIE   JAY  TENNIE     TE    D: 03/13/2017 03:34 AM  T: 03/13/2017 03:34 AM    This document is confidential medical information.  Unauthorized disclosure or   use of this information is prohibited by law.  If you are not the intended recipient of this document, please advise [place] by   calling immediately [phone].

## 2017-03-13 NOTE — Other (Unsigned)
CURRENT STATUS IS EMERGENCY .       :      Any questions regarding PATIENT CLASS/STATUS should be directed to the Care   Management Departments: [facility] [phone] or [facility] [phone] or [facility]  [phone].           _________________________________  Signed byJamison Neighbor  NOTIFY     ZZ    D: 03/13/2017 02:20 AM  T:     This document is confidential medical information.  Unauthorized disclosure or   use of this information is prohibited by law.  If you are not the intended recipient of this document, please advise [place] by   calling immediately [phone].

## 2017-03-16 MED ORDER — cholecalciferol, vitamin D3, 50,000 unit capsule
1250 | ORAL_CAPSULE | ORAL | 0 refills | Status: AC
Start: 2017-03-16 — End: 2017-05-27

## 2017-03-16 NOTE — Telephone Encounter (Signed)
Pt requests refill of Vit D 50,000 units.

## 2017-03-31 MED ORDER — LOVASTATIN 40 MG PO TABS
40 | ORAL_TABLET | ORAL | 1 refills | Status: DC
Start: 2017-03-31 — End: 2017-09-25

## 2017-04-04 MED ORDER — TRIAMTERENE-HCTZ 37.5-25 MG PO TABS
ORAL_TABLET | Freq: Every day | ORAL | 1 refills | Status: DC
Start: 2017-04-04 — End: 2017-06-02

## 2017-04-04 NOTE — Telephone Encounter (Signed)
Yes, that is fine, but only if she goes to get her labs done from November!!  She will be due for routine follow up in May and orders still pending from last visit.      Let me know if she agrees, then I will make the changes.  She is on Lyrica and Norvasc, both of which can possibly cause edema.  So will need to stop norvasc.

## 2017-04-04 NOTE — Telephone Encounter (Signed)
OK.   Have her stop amlodipine   I called in Maxzide 25 (triamterine-hctz; a diuretic that should not make her potassium level drop)  Plan to have labs done after on these meds for a week or so and make appt to follow up in May.  Thanks.

## 2017-04-04 NOTE — Telephone Encounter (Signed)
Pt is calling to see if Dr. Briant Cedar can take the pt off her current BP medications and place the pt on a water pill.    Please advise pt    CB# [phone]    Thanks.

## 2017-04-04 NOTE — Telephone Encounter (Signed)
Please advise.

## 2017-04-04 NOTE — Telephone Encounter (Signed)
Spoke with Pt.  Pt would like a combo BP medication and water pill if possible.  Informed to do lab work, pt stated that she will complete by the end of the week.

## 2017-04-06 NOTE — Telephone Encounter (Signed)
She can just walk into the lab, no need to stop by and pick up orders. Looks like she has 4 open labs and they would be fasting.  Thank you

## 2017-04-06 NOTE — Telephone Encounter (Signed)
lmom 

## 2017-04-06 NOTE — Telephone Encounter (Signed)
Requesting blood work labs     Patient called into office if she could stop by tomorrow and pick up the lab orders or just go down to the lab?     Patient was here recently about Blood Pressure medication but wasn't sure what type of lab work she needs to get done.     Please advise, Thank you!

## 2017-04-08 ENCOUNTER — Encounter

## 2017-04-08 ENCOUNTER — Inpatient Hospital Stay
Admit: 2017-04-08 | Discharge: 2017-04-08 | Disposition: A | Discharge status: Home / Self Care | Attending: Emergency Medicine

## 2017-04-08 DIAGNOSIS — R600 Localized edema: Secondary | ICD-10-CM

## 2017-04-08 LAB — LIPID PANEL
Cholesterol, Total: 147 mg/dL (ref 0–199)
HDL: 62 mg/dL — ABNORMAL HIGH (ref 40–60)
LDL Calculated: 72 mg/dL (ref <100)
Triglycerides: 67 mg/dL (ref 0–150)
VLDL Cholesterol Calculated: 13 mg/dL

## 2017-04-08 LAB — COMPREHENSIVE METABOLIC PANEL
ALT: 105 U/L — ABNORMAL HIGH (ref 10–40)
AST: 94 U/L — ABNORMAL HIGH (ref 15–37)
Albumin/Globulin Ratio: 1.3 (ref 1.1–2.2)
Albumin: 4.3 g/dL (ref 3.4–5.0)
Alkaline Phosphatase: 91 U/L (ref 40–129)
Anion Gap: 15 (ref 3–16)
BUN: 14 mg/dL (ref 7–20)
CO2: 28 mmol/L (ref 21–32)
Calcium: 9.4 mg/dL (ref 8.3–10.6)
Chloride: 95 mmol/L — ABNORMAL LOW (ref 99–110)
Creatinine: 0.5 mg/dL — ABNORMAL LOW (ref 0.6–1.2)
GFR African American: >60 (ref >60)
GFR Non-African American: >60 (ref >60)
Globulin: 3.2 g/dL
Glucose: 156 mg/dL — ABNORMAL HIGH (ref 70–99)
Potassium: 3.9 mmol/L (ref 3.5–5.1)
Sodium: 138 mmol/L (ref 136–145)
Total Bilirubin: <0.2 mg/dL (ref 0.0–1.0)
Total Protein: 7.5 g/dL (ref 6.4–8.2)

## 2017-04-08 LAB — D-DIMER, QUANTITATIVE: D-Dimer, Quant: 505 ng/mL DDU — ABNORMAL HIGH (ref 0–229)

## 2017-04-08 LAB — MICROALBUMIN / CREATININE URINE RATIO
Creatinine, Ur: 111.8 mg/dL (ref 28.0–259.0)
Microalbumin Creatinine Ratio: 110.9 mg/g — ABNORMAL HIGH (ref 0.0–30.0)
Microalbumin, Random Urine: 12.4 mg/dL — ABNORMAL HIGH (ref <2.0)

## 2017-04-08 NOTE — ED Notes (Signed)
Pt returned from vascular      Kaire Stary Kathaleen Bury, RN  04/08/17 1759

## 2017-04-08 NOTE — Telephone Encounter (Signed)
Please call pt. Unsure why she did not get this lab when ordered 2 months ago. Since it is elevated if she is having any leg swelling or shortness of breath she will have to go to the ED to rule out a blood clot. Thanks.

## 2017-04-08 NOTE — Telephone Encounter (Signed)
Called pt  Front.. If pt calls back, please get MA  Thanks!

## 2017-04-08 NOTE — ED Provider Notes (Addendum)
[facility] EMERGENCY DEPT  eMERGENCY dEPARTMENT eNCOUnter        Pt Name: Danielle Miles  MRN: [id number]  Birthdate 1956-08-10  Date of evaluation: 04/08/2017  Provider: Synetta Fail, MD  PCP: Lavon Paganini, MD      CHIEF COMPLAINT       Chief Complaint   Patient presents with   . Abnormal Lab     pt states she went and labs done this morning and they called her and told her that her D-Dimer is elevated. denies chest pain        HISTORY OF PRESENT ILLNESS   (Location/Symptom, Timing/Onset, Context/Setting, Quality, Duration, Modifying Factors, Severity)  Note limiting factors.     Danielle Miles is a 61 y.o. female       Patient had lab work earlier today.  Went to PCP and had an elevated D-Dimer.  So, she was sent in for a rule out DVT.  Originally went to PCP a couple of months ago.  Legs were swelling then.  Had issues with BP.  Was placed on Lisinopril but had a reaction to it.  She started having leg swelling which she thought was due to new BP medicine.  She had a D-Dimer which was elevated.  So, she was told to go to the ED for eval.     Wells' Criteria for DVT:   Paralysis, paresis or recent immobilization of the leg? no (0)   Immobilization >3 days, or Surgery in the previous 12 weeks requiring general or regional anesthesia? no (0)   Tenderness along line of deep venous system yes (1)   Unilateral leg swelling? (3+cm variance) no (0)   Pitting edema in confined to the effected leg? yes (1)   Entire limb swollen? yes (1)   Malignancy w/ Treatment within 6 mo, or palliative?   no (0)      Collateral non-varicose superficial veins? no (0)      Hx of DVT? no (0)   Active cancer within 6 months? yes (0)   Alternative diagnosis more likely than DVT? yes (1)   Total:    1          Low Risk Group: 0-1         Mod-High Risk Group: 2+    Nursing Notes were all reviewed and agreed with or any disagreements were addressed  in the HPI.    REVIEW OF SYSTEMS    (2-9 systems for level 4, 10 or more for level 5)      Review of Systems   Constitutional: Negative for chills, fatigue and fever.   HENT: Negative for ear pain, rhinorrhea and sore throat.    Eyes: Negative for pain, discharge and visual disturbance.   Respiratory: Negative for cough, shortness of breath and wheezing.    Cardiovascular: Positive for leg swelling. Negative for chest pain and palpitations.   Gastrointestinal: Negative for abdominal pain, diarrhea, nausea and vomiting.   Genitourinary: Negative for difficulty urinating, dysuria, pelvic pain and vaginal discharge.   Musculoskeletal: Negative for arthralgias, back pain, joint swelling and neck pain.   Skin: Negative for rash.   Allergic/Immunologic: Negative for environmental allergies.   Neurological: Negative for dizziness, seizures, syncope and headaches.   Hematological: Negative for adenopathy.   Psychiatric/Behavioral: Negative for dysphoric mood and suicidal ideas. The patient is not nervous/anxious.          PAST MEDICAL HISTORY     Past Medical History:  Diagnosis Date   . Difficult intubation    . HTN (hypertension) 07/15/2014   . Multiple sclerosis (HCC)- dx ~2000 07/15/2014   . Type 2 diabetes mellitus (HCC) (dx 2013) 07/15/2014         SURGICAL HISTORY       Past Surgical History:   Procedure Laterality Date   . ANTERIOR CRUCIATE LIGAMENT REPAIR     . APPENDECTOMY     . COLONOSCOPY  06/16/2015    Colon Polyps    . HYSTERECTOMY      due to prolapse         CURRENT MEDICATIONS       Current Discharge Medication List      CONTINUE these medications which have NOT CHANGED    Details   triamterene-hydrochlorothiazide (MAXZIDE-25) 37.5-25 MG per tablet Take 1 tablet by mouth daily  Qty: 30 tablet, Refills: 1      lovastatin (MEVACOR) 40 MG tablet TAKE ONE TABLET BY MOUTH DAILY  Qty: 90 tablet, Refills: 1      guaiFENesin (MUCINEX) 600 MG extended release tablet Take 1 tablet by mouth 2 times daily  Qty: 30 tablet, Refills: 0    Associated Diagnoses: Bronchitis      baclofen (LIORESAL) 10 MG tablet  TAKE ONE TABLET BY MOUTH THREE TIMES A DAY  Qty: 270 tablet, Refills: 1      metFORMIN (GLUCOPHAGE) 1000 MG tablet TAKE ONE TABLET BY MOUTH TWICE A DAY WITH MEALS  Qty: 180 tablet, Refills: 1      pregabalin (LYRICA) 75 MG capsule Take 75 mg by mouth      Blood Glucose Monitoring Suppl (ACCU-CHEK NANO SMARTVIEW) W/DEVICE KIT Use once daily.  Qty: 1 kit, Refills: 0      PROVENTIL HFA 108 (90 BASE) MCG/ACT inhaler INHALE TWO PUFFS BY MOUTH EVERY 6 HOURS AS NEEDED FOR WHEEZING OR FOR SHORTNESS OF BREATH  Qty: 1 Inhaler, Refills: 0      glucose blood VI test strips (ACCU-CHEK AVIVA) strip 1 each by In Vitro route daily E11.9  Accucheck Nano  Qty: 100 each, Refills: 0      oxybutynin (DITROPAN-XL) 10 MG CR tablet Take 10 mg by mouth nightly       Cholecalciferol (VITAMIN D3) 50000 UNITS CAPS Take by mouth once a week      pravastatin (PRAVACHOL) 40 MG TABS Take 40 mg by mouth every evening  Qty: 30 tablet, Refills: 5             ALLERGIES     Lisinopril and Compazine [prochlorperazine]    FAMILY HISTORY       Family History   Problem Relation Age of Onset   . Cancer Mother 71     brain   . Cancer Father 57     lung cancer          SOCIAL HISTORY       Social History     Social History   . Marital status: Single     Spouse name: N/A   . Number of children: 2   . Years of education: N/A     Occupational History   . disabled- due to MS; was OR clerk at [employer]      Social History Main Topics   . Smoking status: Never Smoker   . Smokeless tobacco: Never Used   . Alcohol use Yes      Comment: occ mixed drinks.   . Drug use: No   .  Sexual activity: Not Currently     Partners: Male     Other Topics Concern   . Not on file     Social History Narrative    Lives with daugher, Merlinda Frederick       SCREENINGS             PHYSICAL EXAM    (up to 7 for level 4, 8 or more for level 5)     ED Triage Vitals [04/08/17 1606]   BP Temp Temp Source Pulse Resp SpO2 Height Weight   (!) 164/94 97.7 F (36.5 C) Oral 90 16 96 % 5\' 4"  (1.626 m) 235 lb  14.3 oz (107 kg)      height is 5\' 4"  (1.626 m) and weight is 235 lb 14.3 oz (107 kg). Her oral temperature is 97.7 F (36.5 C). Her blood pressure is 164/94 (abnormal) and her pulse is 90. Her respiration is 16 and oxygen saturation is 96%.     Physical Exam   Constitutional: She is oriented to person, place, and time. She appears well-developed and well-nourished.   HENT:   Head: Normocephalic and atraumatic.   Right Ear: External ear normal.   Left Ear: External ear normal.   Eyes: Right eye exhibits no discharge. Left eye exhibits no discharge. No scleral icterus.   Neck: Normal range of motion. No JVD present. No tracheal deviation present. No thyromegaly present.   Cardiovascular: Normal rate and regular rhythm.  Exam reveals no gallop and no friction rub.    No murmur heard.  Patient has 1+ bilateral pedal edema   Pulmonary/Chest: Effort normal and breath sounds normal. No stridor. No respiratory distress. She has no wheezes. She has no rales.   Abdominal: Soft. She exhibits no distension. There is no tenderness. There is no rebound and no guarding.   Musculoskeletal: She exhibits edema. She exhibits no tenderness.   Neurological: She is alert and oriented to person, place, and time. Coordination normal.   Skin: Skin is warm and dry. No rash (On exposed body surfaces) noted. She is not diaphoretic.   Psychiatric: She has a normal mood and affect. Her behavior is normal. Thought content normal.       DIAGNOSTIC RESULTS   LABS:    No results found for this visit on 04/08/17.    All other labs were within normal range or not returned as of this dictation.    EKG: All EKG's are interpreted by the Emergency Department Physician who either signs or Co-signs this chart in the absence of a cardiologist.    none    RADIOLOGY:   Non-plain film images such as CT, Ultrasound and MRI are read by the radiologist. Plain radiographic images are visualized and preliminarily interpreted by the  ED Provider with the below  findings:    Vl Extremity Venous Bilateral    Result Date: 04/08/2017  Vascular Lower Extremities DVT Study Procedure -- PRELIMINARY SONOGRAPHER REPORT --   Demographics   Patient Name        Brzozowski JOESPHINE FUCHS   Date of Study       04/08/2017   Gender                Female   Patient Number      [id number]   Date of Birth         04-28-1956   Visit Number        [id number]  Age  60 year(s)   Accession Number    [id number]    Room Number           1251A   Corporate ID        [id number]     Sonographer           Rocheleau, Timothee,                                                         RVT, RDCS   Ordering Physician               Interpreting          [facility] Vasc Surg                                   Physician  Procedure Type of Study:   Veins:Lower Extremities DVT Study, VASC EXTREMITY VENOUS DUPLEX BILATERAL.  Tech Comments Right No evidence of deep vein or superficial vein thrombosis involving the right lower extremity. Left No evidence of deep vein or superficial vein thrombosis involving the left lower extremity.          PROCEDURES   Unless otherwise noted below, none     Procedures    CRITICAL CARE TIME   N/A    CONSULTS:  None    EMERGENCY DEPARTMENT COURSE and DIFFERENTIAL DIAGNOSIS/MDM:   Vitals:    Vitals:    04/08/17 1606   BP: (!) 164/94   Pulse: 90   Resp: 16   Temp: 97.7 F (36.5 C)   TempSrc: Oral   SpO2: 96%   Weight: 235 lb 14.3 oz (107 kg)   Height: 5\' 4"  (1.626 m)       Patient was given the following medications:  Medications - No data to display    Stable.  I tried to explain to the patient that the d-dimer is a screening test.  I did my best to explain this to her and was at her bedside for 10 minutes discussing it.  She just started a diuretic yesterday so I've recommended that she continue that and follow up with her primary care provider.  I explained to the patient that an elevated d-dimer does not definitively mean you have a blood clot but a negative one is very very  helpful to rule that out.  Because hers was elevated she did get the vascular studies performed.      FINAL IMPRESSION      1. Pedal edema    2. Condition not found          DISPOSITION/PLAN   DISPOSITION        PATIENT REFERRED TO:  Lavon Paganini, MD  [phone] [facility] [place]  [address]  [place] [address]  [phone]            DISCHARGE MEDICATIONS:  Current Discharge Medication List          DISCONTINUED MEDICATIONS:  Current Discharge Medication List                 (Please note that portions of this note were completed with a voice recognition program.  Efforts were made to edit the dictations but occasionally words are mis-transcribed.)    Chrissie Noa  Lucius Conn, MD (electronically signed)                 Synetta Fail, MD  04/08/17 1623       Synetta Fail, MD  04/08/17 [id number]

## 2017-04-08 NOTE — Telephone Encounter (Signed)
Pt agrees to go to ED as she is having swelling

## 2017-04-08 NOTE — ED Notes (Signed)
Discharge and education instructions reviewed. Patient verbalized understanding, teach-back successful. Patient denied questions at this time. No acute distress noted. Patient instructed to follow-up as noted - return to emergency department if symptoms worsen. Patient verbalized understanding. Discharged per EDMD with discharge instructions. Pt ambulatory        Caroljean Monsivais Kathaleen Bury, RN  04/08/17 1901

## 2017-04-26 ENCOUNTER — Ambulatory Visit: Admit: 2017-04-26 | Discharge: 2017-04-27 | Payer: MEDICARE | Attending: Family Medicine

## 2017-04-26 DIAGNOSIS — E113299 Type 2 diabetes mellitus with mild nonproliferative diabetic retinopathy without macular edema, unspecified eye: Secondary | ICD-10-CM

## 2017-04-26 LAB — POCT GLYCOSYLATED HEMOGLOBIN (HGB A1C): Hemoglobin A1C: 7.5 %

## 2017-04-26 NOTE — Progress Notes (Signed)
Subjective:      Patient ID: Danielle Miles is a 61 y.o. female.    Blood pressure 134/82, pulse 113, temperature 97.7 F (36.5 C), temperature source Oral, resp. rate 20, height 5' 4"  (1.626 m), weight 239 lb (108.4 kg), SpO2 93 %, not currently breastfeeding.     HPI Here for routine follow up of DM.  She has had a rough winter.  Had influenza in December, then got bronchitis after that.  Then a flare of her MS.  Then last month she had an allergic reaction to lisionpril with angioedema.  Her lisinpril also contained HCTZ, so when she stopped that she started to swell more.    She had leg swelling and a high D dimer from lab ordered by Dr Day months prior (on a Friday afternoon she had this done and was referred to the ER for urgent eval on a Friday afternoon).  The CT lung was neg.  Subsequent leg venous doppler was negative also.    She states she has been more tired of last.  Is now drinking pop the past few weeks.  Has gained weight.  Her fasting sugars are still OK (120s).    Lab Results   Component Value Date    LABA1C 6.7 10/24/2015     Lab Results   Component Value Date    EAG 145.6 10/24/2015      Current meds for diabetes include metformin 1021m bid.    Her a1c today is 7.5%    She has struggled with proper nutrition.  She has seen a nutritionist when she was first diagnosed with DM and lost about 50 lbs.  But gained it all back after her daughter was in a bad car accident.    Labs show elevated LFT's and microalbuminuria.    Has appt tomorrow to have eyes checked- has retinopathy.    Has normal renal function.    Lab Results   Component Value Date    NA 138 04/08/2017    K 3.9 04/08/2017    BUN 14 04/08/2017    CREATININE 0.5 04/08/2017          Lab Results   Component Value Date    CHOL 147 04/08/2017    TRIG 67 04/08/2017    HDL 62 (H) 04/08/2017    LDLCALC 72 04/08/2017     Lab Results   Component Value Date    ALT 105 (H) 04/08/2017    AST 94 (H) 04/08/2017        She is on Rebif for MS. [facility]  Currently is stable, though had some bladder problems this winter.      Was told vit D level is low.  Her neurologist restarted her vit D 50,000 IU.    HTN: now on maxzide 25.  She is no longer on ACE due to allergy.  This has helped her leg swelling.  She still is SOB at times- mainly with exertion.  Also at rest.   This has been since her MS diagnosis.  She has not sen a cardiologist, but [facility]    Reason for Study: ICD-9: 786.50 Chest Pain, Unspecified.    Procedure: 2D Echo with Doppler and color flow (93306).    Left Ventricle: Overall left ventricular ejection fraction is  estimated to be 55-60%. The left ventricular function is normal. The  left ventricle is normal in size. There is normal  left ventricular  wall thickness. The left ventricular wall motion is normal. No left  ventricular thrombus detected.  Right Ventricle: The right ventricle is normal size. There is normal  right ventricular wall thickness. The right ventricular systolic  function is normal.    Nuclear imaging stress report  Study Date: 07/11/2014 02:09 PM    Interpetation Summary:  The LV EF is 72%  Normal global and regional wall motion in all territories.     1.Non-diagnostic ECG for ischemia with pharmocologic stress.   2.Negative nuclear stress imaging for inducible ischemia.      Still using lovastatin 107m for Hyperlipidemia.  Has not caused body aches to worsen (also has fibromyalgias).      Lab Results   Component Value Date    CHOL 147 04/08/2017    TRIG 67 04/08/2017    HDL 62 (H) 04/08/2017    LDLCALC 72 04/08/2017     Lab Results   Component Value Date    ALT 105 (H) 04/08/2017    AST 94 (H) 04/08/2017           Patient Active Problem List   Diagnosis   . HTN (hypertension)   . Type 2 diabetes mellitus ([place] (dx 2013)   . Overactive bladder- related to MS   . [facility] [facility] [place]  . Fibromyalgia- dx by DR FCandace Gallus   . OSA (obstructive sleep apnea)- has  CPAP, but not using.   .Marland KitchenHyperlipidemia   . 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 S/P colonoscopic polypectomyx5; 06/16/15; DR CSherlyn Lick     Body mass index is 41.02 kg/m.    Wt Readings from Last 3 Encounters:   04/26/17 239 lb (108.4 kg)   04/08/17 235 lb 14.3 oz (107 kg)   02/10/17 240 lb (108.9 kg)      BP Readings from Last 3 Encounters:   04/26/17 134/82   04/08/17 (!) 164/94   02/10/17 126/88      Current Outpatient Prescriptions   Medication Sig Dispense Refill   . triamterene-hydrochlorothiazide (MAXZIDE-25) 37.5-25 MG per tablet Take 1 tablet by mouth daily 30 tablet 1   . lovastatin (MEVACOR) 40 MG tablet TAKE ONE TABLET BY MOUTH DAILY 90 tablet 1   . baclofen (LIORESAL) 10 MG tablet TAKE ONE TABLET BY MOUTH THREE TIMES A DAY 270 tablet 1   . metFORMIN (GLUCOPHAGE) 1000 MG tablet TAKE ONE TABLET BY MOUTH TWICE A DAY WITH MEALS 180 tablet 1   . Blood Glucose Monitoring Suppl (ACCU-CHEK NANO SMARTVIEW) W/DEVICE KIT Use once daily. 1 kit 0   . PROVENTIL HFA 108 (90 BASE) MCG/ACT inhaler INHALE TWO PUFFS BY MOUTH EVERY 6 HOURS AS NEEDED FOR WHEEZING OR FOR SHORTNESS OF BREATH 1 Inhaler 0   . glucose blood VI test strips (ACCU-CHEK AVIVA) strip 1 each by In Vitro route daily E11.9  Accucheck Nano 100 each 0   . oxybutynin (DITROPAN-XL) 10 MG CR tablet Take 10 mg by mouth nightly      . Cholecalciferol (VITAMIN D3) 50000 UNITS CAPS Take by mouth once a week     . pravastatin (PRAVACHOL) 40 MG TABS Take 40 mg by mouth every evening 30 tablet 5   . pregabalin (LYRICA) 75 MG capsule Take 75 mg by mouth       No current facility-administered medications for this visit.  Immunization History   Administered Date(s) Administered   . Pneumococcal Polysaccharide (Pneumovax23) 09/25/2014        Social History   Substance Use Topics   . Smoking status: Never Smoker   . Smokeless tobacco: Never Used   . Alcohol use Yes      Comment:  occ mixed drinks.        Review of Systems As above     Objective:   Physical Exam   Constitutional: She is oriented to person, place, and time. She appears well-developed and well-nourished.   Neck: Normal range of motion. Neck supple. Carotid bruit is not present. No thyromegaly present.   Cardiovascular: Normal rate, regular rhythm, normal heart sounds and intact distal pulses.    No murmur heard.  Pulmonary/Chest: Effort normal and breath sounds normal. No respiratory distress. She has no wheezes. She has no rales.   Musculoskeletal: Normal range of motion. She exhibits no edema.   Neurological: She is alert and oriented to person, place, and time.   Skin: Skin is warm and dry.   Psychiatric: She has a normal mood and affect. Her behavior is normal. Judgment and thought content normal.   Nursing note and vitals reviewed.      Assessment:      1. 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  She is highly motivated to improve her control with diet; referred for nutritional counseling.  - continue metformin at current dose- 1000 bid.    - Amb Referral to Nutrition Services  - POCT glycosylated hemoglobin (Hb A1C)    If has persistent microalbuminuria, will need ARB.    2. Essential hypertension  bp stable on maxzide with good control of leg edema. Continue.    3. Multiple sclerosis ([place])- dx ~2000; Dr [facility] at [facility]  - presentyl stable on biologic agent per neurology    4. Pure hypercholesterolemia  - Stable; continue statin therapy due to DM-2.  (lova 40)    5. Mild nonproliferative diabetic retinopathy without macular edema associated with type 2 diabetes mellitus, unspecified laterality (HCC)  - has eye appt 5/3.    6. Class 3 obesity due to excess calories with serious comorbidity and body mass index (BMI) of 40.0 to 44.9 in adult [facility])  - As above.  The patient is asked to make  an attempt to improve diet and exercise patterns to aid in medical management of this problem.     7. Fatty liver disease, nonalcoholic  - discussed this as a product of being over-fed chronically.  Best to address with weigh tloss.  Can add pioglitazone later if needed, but this can add weight and edema.          Plan:      f/u 3 mo

## 2017-05-27 MED ORDER — cholecalciferol, vitamin D3, 50,000 unit capsule
1250 | ORAL_CAPSULE | ORAL | 3 refills | Status: AC
Start: 2017-05-27 — End: 2018-05-16

## 2017-05-27 NOTE — Telephone Encounter (Signed)
Pt is requesting a refill or Vitamin D 50,000 units, pt taking medication as prescribed, pharmacy confirmed. Pt states she is completely out of medication. Please advise.

## 2017-05-27 NOTE — Telephone Encounter (Signed)
Pt states she was returning a missed phone call. Please give the pt a call back.

## 2017-05-30 NOTE — Telephone Encounter (Signed)
Pt is requesting a new script for Ibuprofen 800 mg, pt taking medication as prescribed, pharmacy confirmed. Please advise pt once script is complete.

## 2017-05-31 MED ORDER — ibuprofen (ADVIL,MOTRIN) 800 MG tablet
800 | ORAL_TABLET | Freq: Three times a day (TID) | ORAL | 0 refills | Status: AC | PRN
Start: 2017-05-31 — End: ?

## 2017-05-31 NOTE — Unmapped (Signed)
Chronic use of Ibuprfen has side affects on stomach and kidney. It would be better this Rx comes from PCP to monitor her for those possible side effects.

## 2017-05-31 NOTE — Unmapped (Signed)
Ibuprofen Rx was placed

## 2017-05-31 NOTE — Unmapped (Signed)
Patient was informed that her Rx was ready.

## 2017-06-02 MED ORDER — TRIAMTERENE-HCTZ 37.5-25 MG PO TABS
ORAL_TABLET | ORAL | 0 refills | Status: DC
Start: 2017-06-02 — End: 2017-06-03

## 2017-06-03 MED ORDER — TRIAMTERENE-HCTZ 37.5-25 MG PO TABS
37.5-25 | ORAL_TABLET | ORAL | 5 refills | Status: DC
Start: 2017-06-03 — End: 2017-07-05

## 2017-06-22 MED ORDER — LYRICA 75 mg capsule
75 | ORAL_CAPSULE | ORAL | 2 refills | Status: AC
Start: 2017-06-22 — End: 2017-07-22

## 2017-06-22 NOTE — Unmapped (Signed)
The patient that she needs to come in for followup, she has not been seen since April 2017 by me and has also not see Dr Eliseo Squires in 18 months she does not have recent bloodwork which is required for monitoring Rebif and Lyrica

## 2017-06-23 NOTE — Unmapped (Signed)
Rx called into pharmacy-patient scheduled a follow up-complete

## 2017-07-05 MED ORDER — TRIAMTERENE-HCTZ 37.5-25 MG PO TABS
ORAL_TABLET | ORAL | 5 refills | Status: DC
Start: 2017-07-05 — End: 2018-01-10

## 2017-07-06 ENCOUNTER — Ambulatory Visit: Admit: 2017-07-06 | Payer: MEDICARE

## 2017-07-06 ENCOUNTER — Ambulatory Visit: Admit: 2017-07-06 | Payer: Medicare (Managed Care) | Attending: Neurology

## 2017-07-06 DIAGNOSIS — R269 Unspecified abnormalities of gait and mobility: Secondary | ICD-10-CM

## 2017-07-06 NOTE — Unmapped (Signed)
Date Symptom   Onset/Outcome Brain MRI Spine MRI Tests DMT period A/E Meds   2000 Fatigue, cognitive G/P        2004 Blurry vision B/L G/I  Dx Melanie    Rebif IVMP   2013  Stable 04-now  PV -ve      03/2014  09/2014 S 3/15 R leg W after cold    4/15 S       12/2014  9/16 S       12/2015  02/2016     9/17 S      06/2017                  Onset: Acute/Gradual , Outcome: Resolved/Improved/Progressed, MRI: Peri Ventricular/Juxta Cortical,      HPI:      07/06/17:  She feels about the same, denies new symptom or progression of old symptom, no recent fall, no recent UTI or other infection, report adherence to her treatment.      02/25/16 Dr. Rolm Baptise:   Melanie [name] is here for evaluation of return of shooting pain in the right leg with difficulty walking and went to the Clinic where she was started on Antibiotics (Bactrim x 7 days) and given pain meds, Norco as well as Valium for spasms with improvement 3 days later.  After a week symptoms resolved and she returned to baseline until just yesterday when she started having the shooting pain  and right leg spasm again which affects her walking.  She is still on Rebif and takes it regularly with no missed doses.    01/13/16:  She denies any new symptoms or exacerbation except worsening of fatigue after stopping high dose vitamin D. Her family doctor recommended taking OTC but that does not help.     07/15/15 Sandy:  Melanie [name] is here for evaluation of worsening symptoms.  She reports that she has noticed that she has increased trouble walking when she gets up in the morning, she feels like she is a little of balance.  Once she has walked to the bathroom she is back to baseline.  She has also noticed that she has some pain in her right leg from halfway down her thigh to her calf which occurs every now and then for about 1 month. This has been going on for about 3 weeks, she otherwise has no other new symptoms.    01/08/15:  She thinks overall is stable, denies any new symptom or exacerbation.  She received a steroid shot for knee pain which helped.      10/01/14:  I had the pleasure of evaluating Melanie [name] who is here for evaluation of new symptoms.  She is on Rebif since 2004 and take is regulalry with no missed doses.  She developed new pain in her left knee and left leg, She also noticed increased numbness in that extremity which lasted about 3 days and it has started to improve.    04/09/14 Dr. Genelle Gather  On 4/2, she woke up and couldn't walk, felt her R leg was weak. Also lost sensation with tingling. She waited to see if this would get better. She called on 4/6 and Dr. Genelle Gather thought she is probably having exacerbation. Labs were done with CBC, BMP, UA which did not show evidence of infection. Nurse visited her at home to get IV access. IV solumedrol was started on 4/12. She got the second dose today. Since about Friday last week, sensation was coming back. Weakness is improved  but not back to baseline. She is able to walk. Dancing is still hard. She is still on Rebif, doing ok with the injection. This is a first real exacerbation since 2004.   Recent exacerbation happened after recent URI.    02/27/13  She was working as OR Haematologist at [employer]. She was feeling very tired with some cognitive and memory problem around 2000. Thne she had an episode of visual change in both eye in 2004 with partial recovery. She was Dx with Melanie based on MRI. She was started on Rebif . She thinks it works and tolerating it well however does not like injection. Her last MRI was from April 2013.   Last year she lost 25 Lbs by diet and exercise after she was diagnosed with Diabetes but she regain that weight. She is planning to go back in diet this week.     I reviewed Dr. Richardson Chiquito note from 03/09/2012:  I had the pleasure of re-evaluating Melanie [name], a 61 year old AA female with RRMS, for a return visit in the [facility] clinic. Pt is currently on Rebif and is tolerating it well. She has not had an MRI since January 2012.  Since pts last visit she feels her condition is getting worse. She is experiencing cognitive issues including concentration and memory issues. She has also started to urinate uncontrollably and has missed work as a result. Pt is having trouble walking due to pain in both knees and L hip pain. She also complains of stress at work which causes her to be depressed.   61 year old AA female with RRMS, for a return visit in the [facility] clinic. Pt is currently on Rebif and is tolerating it well. She has not had an MRI since January 2012. Since pts last visit she feels her condition is getting worse. She is experiencing cognitive issues including concentration and memory issues. She has also started to urinate uncontrollably and has missed work as a result. Pt is having trouble walking due to pain in both knees and L hip pain. She also complains of stress at work which causes her to be depressed.   Due to pts complaints of cognitive issues, we will refer her for a neuropsych eval. We will also give her the contact number to the social worker for the [facility]. I would like to obtain and MRI of the brain and c spine.    Review of System:      Bladder incontinence   Nocturia   Weakness:  BLE when she first gets up  Pain:     R. Knee and hip, Steroid shot and Gabapentin   Sensory  Numbness r. foot  Fatigue:  bothersome  Sleep:        4 to 5 hrs uninterrupted,   Sleep Apnea:  No longer uses CPAP    STM   Main symptom she get disability   Urinary Incontinece Oxybutinin helps, she has done pelvic exercise, this was one the reason she stopped working   Walking Difficulty Tired and back pain, can do more after exercise   Imbalance  Trip over but no fall   Fatigue                        Exhausted    Life Style:      Living Situation: With daughters family  Diet:  Diet  Exercise: Line Dance as exercise 4 days a week   Vitamin D: 50  K weekly     Medications:      Current Outpatient Prescriptions   Medication Sig   ??? albuterol Inhale 2 puffs  into the lungs. Every 4 to 6 hours as needed.    ??? baclofen TAKE ONE TABLET BY MOUTH THREE TIMES A DAY   ??? blood sugar diagnostic Test blood sugars up to twice a day   ??? cholecalciferol (vitamin D3) Take 1 capsule (50,000 Units total) by mouth every 7 days.   ??? ibuprofen Take 1 tablet (800 mg total) by mouth every 8 hours as needed for Pain.   ??? inhalational spacing device by Misc.(Non-Drug; Combo Route) route. AEROCHAMBER PLUS.  Use with MDI    ??? LORazepam 15 min before MRI, can be repeated after 15 min.   ??? lovastatin Take 40 mg by mouth daily with dinner.   ??? LYRICA TAKE ONE CAPSULE BY MOUTH TWICE A DAY   ??? metFORMIN Take 1 tablet (500 mg total) by mouth 2 times a day with meals.   ??? oxybutynin Take 1 tablet (10 mg total) by mouth 2 times a day with meals.   ??? REBIF REBIDOSE      No current facility-administered medications for this visit.       Allergies: Compazine [prochlorperazine edisylate] and Prochlorperazine    History:      PMHx:  has a past medical history of Asthma; Diabetes mellitus (CMS Dx); Hypertension; and Multiple sclerosis (CMS Dx).     PSHx:  has a past surgical history that includes Knee surgery; Appendectomy; and Hysterectomy.    Social Hx: reports that she has never smoked. She has never used smokeless tobacco. She reports that she drinks alcohol. She reports that she does not use drugs. She lives by herself. She feels she is safe but has to take extra time. Feels depressed being lonely. Grandkids lives cross the street and come and visit her. She goes to [facility], do dance club and try to stay active.     ZOX:WRUEAV history includes Alcohol abuse in her father and other; Brain Cancer in her other; COPD in her father; Cancer in her father, maternal grandmother, mother, and sister; Diabetes in her daughter, paternal aunt, paternal grandfather, and paternal uncle; Skin Cancer in her other; Stomach Cancer in her other; Throat Cancer in her other.    Physical Exam:    BP 136/80    Pulse 88    Ht 5'  4 (1.626 m)    Wt 220 lb (99.8 kg)    BMI 37.76 kg/m??      General: NAD  Resp:     Clear Bilaterally, regular unlabored breaths  CV:     RRR  No Edema, no skin rashes    Mental Status:Patient registered 3 objects without difficulty and recalled 3/3 after five minutes.  The patient was fully oriented and had a normal fund of knowledge.  Cranial Nerves: Color Vision12/12 OD and 10/12 OS, best corrected visual acuity was 20/20 OD and 20/20 OS.  There was no afferent pupillary deficit.  Funduscopic exam revealed normal optic nerve heads.   Horizontal and vertical eye movements were full.  There was no nystagmus.  Facial sensation was intact to pinprick.  There was no facial weakness.  Hearing was intact to finger rubbing bilaterally. Palate moved fully and symmetrically.  Tongue protruded in the midline.  There was no dysarthria.  Motor:   Strength:R-L (MRC Scale)   Deltoid  5 - 5   Hip Fx  4+ - 5 (R. Hip limited by pain)  Biceps   5 - 5   Knee Ex  5 - 5   Triceps  5 - 5   Knee Fx  5 - 5   Wrist Ex  5 - 5   Plantar Fx 5 - 5   Grip         5 - 5   Ankle dorsiflex 5 - 5  Tone was normal  Reflexes: R / L (MRC Scale)   Biceps  2 / 2   Quadriceps 2 / 2   Triceps  2 / 2   Achilles  2 / 2   Brachoradialis 2 / 2   Toes   Up / down   Coordination: There was no ataxia on finger to nose or heel to shin testing.  Sensation: Decreased vibration BLE.     Position and pinprick sensation were intact in all four extremities.  Gait and station:  Unable to do tandem walk and hopping on either foot.   25 foot walk 02/27/2013 04/09/2014 10/01/2014 01/08/2015 07/15/2015 01/13/2016 07/06/2017   Did the patient wear an ankle foot orthosis (AFO)? No No No No No No No   Was an assistive device used? No No No No No No No   Trial No. 1: Completed Completed Completed Completed Completed - -   Time for 25 Foot Walk: 6.06 secs 7.46 secs 8.26 secs 5.40 secs 5.81 secs 4.96 secs 6.21 secs      Tests:      09/23/16   Cervical spine:  Unchanged minimal  noncompressive disc bulges at C4-C5, and C5-C6 without spinal cord signal abnormalities or enhancement.   Fairly extensive signal abnormality seen in the pons which appears unchanged  Thoracic spine:  Limited evaluation of thoracic cord signal secondary to artifact, without definite cord signal abnormality.  Lumbar spine:  Multilevel degenerative changes of the lumbar spine as detailed, most significant at L2-L3 and L3-L4 as detailed. There is moderate bilateral L3 and right L4 foraminal stenosis.  Enhancement along the facet joints at L3-L4 may represent synovitis. At this level, there is minimal grade 1 anterolisthesis of L3 on L4.    09/04/15 compare to 04/08/14  Brain:  1. Stable moderate plaque burden within the periventricular and subcortical white matter compatible with the history of Melanie. No new or active lesions.  Cervical Spine:  1. No cord signal abnormality or enhancing lesions.  Thoracic spine:  1.?? No definite cord signal abnormality or enhancing lesions. Motion degraded examination limits cord evaluation.    04/08/14 COMPARISON: MRI dated 03/27/2012.  MRI head:   1. No significant interval change in the moderate burden of T2 signal prolongation involving the periventricular, deep, and central pontine white matter consistent with the clinical diagnosis of multiple sclerosis. No new lesions, restricted diffusion, or enhancement is identified.   2. Small amount of layering fluid signal in the right maxillary sinus.         MRI cervical spine:   1. No evidence of cord signal abnormalities or enhancement.   2. Very mild multilevel degenerative discogenic changes without evidence of central canal or neuroforaminal stenosis, unchanged.         MRI thoracic spine:   1. No evidence of cord signal abnormalities or enhancement.   2. Very mild degenerative discogenic changes without evidence of central canal or neuroforaminal stenosis, involving the T3-T4 through T7-T8 levels.      Lab Results   Component Value Date  WBC 9.0 02/25/2016    HGB 13.1 02/25/2016    HCT 40.1 02/25/2016    MCV 91.8 02/25/2016    PLT 219 02/25/2016     Lab Results   Component Value Date    CREATININE 0.67 08/31/2016    BUN 15 08/31/2016    NA 140 08/31/2016    K 4.1 08/31/2016    CL 101 08/31/2016    CO2 30 08/31/2016     Lab Results   Component Value Date    ALT 42 02/25/2016    AST 60 (H) 04/02/2014    ALKPHOS 47 02/25/2016    BILITOT 0.4 02/25/2016     Lab Results   Component Value Date    VITD25H 72.8 02/25/2016     Lab Results   Component Value Date    VITAMINB12 322 10/01/2014     Lab Results   Component Value Date    TSH 1.42 10/01/2014       Assessment/Plan:       Melanie [name] is a 61 y.o. female with Multiple Sclerosis diagnosed in 2004 who has treated only with Rebif. Melanie is not active and not progressing. She is very concern about side effects of other DMT and at this time I do not feel the need to make any change in her treatment however she needs to pay more attention to her health in general. I counseled her in length about role of healthy life style including diet, exercise and sleep in management of Melanie. She remain uninterested in symptom management such as rehabilitation or consulting with urologist about her bladder symptoms.     1. Multiple sclerosis  - Continue Rebif  - Differential; Standing  - CBC; Standing  - RTC in 1 year     2.  Lumbar Spondylosis with radiculopathy   - Amb referral to Physical Therapy  - Counseled about weight loss, posture, diet and exercise    3. Gait difficulty likely multifactorial Melanie + Spondylosis   - Amb referral to Physical Therapy  - Cane    4. Spasm  - Baclofen     5. Neuropathic pain  - Gabapentin    6. Vitamin D deficiency  - Vitamin D 25 hydroxy; Future  - continue supplement     7.Frequent UTI,  Urge incontinence of urine, nocturia concern about falls   - reordered Amb referral to Urology but patient remains unsure about this     Total time spent with patient was 25 minutes. Greater than 50% of that  time was spent counseling about Multiple Sclerosis disease management,  Rebif, and symptom management as detailed above in the assessment and plan, as well as coordinating care.      Raeanne Gathers, MD  Assistant Professor of Neurology   Director of [facility]   [address]   [place], [place] [address]????  [phone]

## 2017-07-06 NOTE — Unmapped (Signed)
Continue Rebif   Blood work today   Disc disease in lower back:   Refer to PT  Weight loss   Exercise   Return in 1 year

## 2017-07-08 NOTE — Unmapped (Signed)
Pt is calling back to inform she has new insurance. Pt was told to call with the information, she did not have her insurance card at last OV.     [employer]. ZO#[id number] Group#[id number] Health Plan#[id number]

## 2017-07-13 MED ORDER — BACLOFEN 10 MG PO TABS
10 MG | ORAL_TABLET | ORAL | 0 refills | Status: DC
Start: 2017-07-13 — End: 2018-01-10

## 2017-07-13 NOTE — Telephone Encounter (Signed)
Call pt and make sure she has fu with Dr. Briant CedarMattingly.  Should be in aug.  No appt. sched.  I will refill med.

## 2017-07-13 NOTE — Telephone Encounter (Signed)
Called patient-no answer

## 2017-07-13 NOTE — Telephone Encounter (Signed)
Disability paperwork faxed to [employer], [phone], attn Martie Round.    Jon Gills, LISW-S

## 2017-07-14 MED ORDER — oxybutynin (DITROPAN-XL) 10 MG 24 hr tablet
10 | ORAL_TABLET | ORAL | 1 refills | Status: AC
Start: 2017-07-14 — End: 2017-07-26

## 2017-07-14 NOTE — Unmapped (Signed)
I had Amy/front desk update pt insurance information.

## 2017-07-25 NOTE — Unmapped (Signed)
Pt is calling back to speak with MA regarding disability form that was completed. Pt is asking for a call back after 3pm to discuss this because she will be in a meeting.

## 2017-07-25 NOTE — Unmapped (Signed)
Patient showed up at office. I found the form she was needing and got the new form from her. Gave disability form to [place] and mailed copies to the patient as requested.

## 2017-07-25 NOTE — Unmapped (Signed)
Pt is calling to request aqua therapy to be added to PT order at [facility].  Pls advise when this has been done. Can leave a message on voicemail if no answer.

## 2017-07-26 ENCOUNTER — Ambulatory Visit: Admit: 2017-07-26 | Payer: Medicare (Managed Care)

## 2017-07-26 DIAGNOSIS — N3941 Urge incontinence: Secondary | ICD-10-CM

## 2017-07-26 LAB — POCT URINALYSIS DIPSTICK, AUTOMATED W/O MICRO
POCT - Bilirubin, UA: NEGATIVE
POCT - Blood, UA: NEGATIVE
POCT - Glucose, UA: NEGATIVE
POCT - Ketones, UA: 15
POCT - Nitrite, UA: NEGATIVE
POCT - Protein, UA: 30
POCT - Specific Gravity, Urine: 1.03
POCT - Urobilinogen, UA: 0.2
POCT - pH, UA: 5.5

## 2017-07-26 MED ORDER — oxybutynin (DITROPAN-XL) 10 MG 24 hr tablet
10 | ORAL_TABLET | Freq: Every day | ORAL | 0 refills | Status: AC
Start: 2017-07-26 — End: ?

## 2017-07-26 NOTE — Unmapped (Signed)
Chief Complaint   Patient presents with   ??? New Patient Visit/ Consultation          History of Present Illness     States she urinates more than she should. Has been going on for 2-3 months. Is waking up in the middle of the night to go to the restroom, at least 2-3 times per night. Also states she has some episodes of incontinence when she cannot make it to the restroom on time, and also some continued leakage after using the restroom. Denies any pain or burning. Patient states she also feels more bloated. Feels like she will accidentally pass gas while walking. Has had a few UTIs in the past. Denies fevers, n/v, abdominal pain, blood in her urine.     Afraid she has a nerve problem, was told she has disc herniation seen on MRI.     Duration: several months  Severity: moderate  Condition: chronic  Location: bladder      Review of Systems   Constitutional: Negative for chills, fatigue and fever.   HENT: Negative for congestion and sinus pressure.    Eyes: Negative for redness and itching.   Respiratory: Negative for cough and shortness of breath.    Cardiovascular: Negative for chest pain, palpitations and leg swelling.   Gastrointestinal: Negative for abdominal pain, constipation, diarrhea, nausea and vomiting.   Genitourinary: Positive for enuresis, frequency, nocturia and urgency. Negative for decreased urine volume, difficulty urinating, dysuria, flank pain, hematuria, pelvic pain, vaginal bleeding, vaginal discharge and vaginal pain.   Musculoskeletal: Positive for back pain. Negative for joint swelling.   Skin: Negative for rash.   Neurological: Negative for dizziness, light-headedness and headaches.   Hematological: Does not bruise/bleed easily.   Psychiatric/Behavioral: Negative for confusion. The patient is not nervous/anxious.        Allergies  Compazine [prochlorperazine edisylate] and Prochlorperazine    Medications  Outpatient Encounter Prescriptions as of 07/26/2017   Medication Sig Dispense Refill   ???  albuterol (PROVENTIL HFA;VENTOLIN HFA) 90 mcg/actuation inhaler Inhale 2 puffs into the lungs. Every 4 to 6 hours as needed.      ??? baclofen (LIORESAL) 10 MG tablet TAKE ONE TABLET BY MOUTH THREE TIMES A DAY 90 tablet 1   ??? blood sugar diagnostic Strp Test blood sugars up to twice a day 50 strip 6   ??? cholecalciferol, vitamin D3, 50,000 unit capsule Take 1 capsule (50,000 Units total) by mouth every 7 days. 12 capsule 3   ??? ibuprofen (ADVIL,MOTRIN) 800 MG tablet Take 1 tablet (800 mg total) by mouth every 8 hours as needed for Pain. 90 tablet 0   ??? inhalational spacing device (AEROCHAMBER) Spcr by Misc.(Non-Drug; Combo Route) route. AEROCHAMBER PLUS.  Use with MDI      ??? LORazepam (ATIVAN) 0.5 MG tablet 15 min before MRI, can be repeated after 15 min. 2 tablet 0   ??? lovastatin (MEVACOR) 40 MG tablet Take 40 mg by mouth daily with dinner.     ??? metFORMIN (GLUCOPHAGE) 500 MG tablet Take 1 tablet (500 mg total) by mouth 2 times a day with meals. 60 tablet 3   ??? oxybutynin (DITROPAN-XL) 10 MG 24 hr tablet TAKE TWO TABLETS BY MOUTH DAILY FOR 30 DAYS 180 tablet 1   ??? REBIF REBIDOSE 44 mcg/0.5 mL PnIj        No facility-administered encounter medications on file as of 07/26/2017.         Histories  She has a past  medical history of Asthma; Diabetes mellitus (CMS Dx); Hypertension; and Multiple sclerosis (CMS Dx).    She has a past surgical history that includes Knee surgery; Appendectomy; and Hysterectomy.    Her family history includes Alcohol abuse in her father and other; Brain Cancer in her other; COPD in her father; Cancer in her father, maternal grandmother, mother, and sister; Diabetes in her daughter, paternal aunt, paternal grandfather, and paternal uncle; Skin Cancer in her other; Stomach Cancer in her other; Throat Cancer in her other.    She reports that she has never smoked. She has never used smokeless tobacco. She reports that she drinks alcohol. She reports that she does not use drugs.    The following  portions of the patient's history were reviewed and updated as appropriate: allergies, current medications, past family history, past medical history, past social history, past surgical history and problem list.    Blood pressure 128/86.     Physical Exam   Constitutional: She is oriented to person, place, and time. She appears well-developed and well-nourished.   HENT:   Head: Normocephalic and atraumatic.   Neck: Normal range of motion. Neck supple.   Pulmonary/Chest: Effort normal. No respiratory distress.   Abdominal: Soft. She exhibits no distension. There is no tenderness.   Musculoskeletal: Normal range of motion.   Lymphadenopathy:     She has no cervical adenopathy.   Neurological: She is alert and oriented to person, place, and time.   Skin: She is not diaphoretic.          Assessment  61 yo F with hx of MS and HTN presenting with with mixed incontinence.  -2-3 episodes nocturia nightly  -No pain  -No signs of infection    Plan  -UA pending  -Ditropan 10 mg daily:Discussed SEs which include but not limited to constipation, dry mouth, difficulty voiding , weak stream and/or mental confusion and drawsiness     -Bladder diet  -Voiding diary  -6 Week follow up, may need CMG if no improvement       Medical Decision Making  The following items were considered in medical decision making:  Discussion of patient care with other providers     I performed a history and physical exam of the patient and discussed patient management with the resident I reviewed the resident's note and agree with the documented findings and plan of care.  Gwenlyn Perking Pratik Dalziel

## 2017-07-28 ENCOUNTER — Encounter
Admit: 2017-07-28 | Discharge: 2017-07-28 | Payer: Medicare (Managed Care) | Attending: Rehabilitative and Restorative Service Providers"

## 2017-07-28 DIAGNOSIS — R269 Unspecified abnormalities of gait and mobility: Secondary | ICD-10-CM

## 2017-07-28 NOTE — Telephone Encounter (Signed)
Pt is calling to see if she can get the referral for the nutrition again.  She is ready to schedule this now. Please put on the order more detailed scheduling instructions.  When pt called the scheduling # they were not sure what she needed.  She told them she needed to schedule with a nutritionist and they weren't sure if this was the DM education class.    Please call pt

## 2017-07-28 NOTE — Telephone Encounter (Signed)
Has order placed from Dr. Briant CedarMattingly on 04/26/17 for referral to nutritional services. This would be for diabetic nutritional services.

## 2017-07-28 NOTE — Telephone Encounter (Signed)
Spoke with pt. She stated that she seen her therapist today and she recommended her to a few nutritionist. But she did not have the information. She will call back with the information.

## 2017-07-28 NOTE — Telephone Encounter (Signed)
Can the referral be placed for patient?

## 2017-07-28 NOTE — Unmapped (Signed)
Physical Therapy Initial Evaluation and Plan of Care    Name: Melanie [name]     Date of Birth: 04-01-1956      MRN: [id number]    Date of evaluation: 07/28/2017  Referring Physician: Raeanne Gathers, MD  [address]  [address]  [place], [place] [address]    Specific Order: eval and treat  Date of Onset: 07/06/17  Primary Medical Diagnosis:   1. 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 4  DED:$0     OOP:$6000    MET:$387.00  COPAY:$40 PER EACH IND SERV PER DAY     CO-INS:    100%  NO YRLY/LTM/PRE-EXISTING   DRAKE IS IN NETWORK  PER:  [facility]  REF#   [id number]  PHONE# [phone]    Subjective/History:   History of Current Problem/Reason for Referral: Melanie [name] is a 61 y.o. female who presents today with chief complaint / concerns and context regarding: disk problem in her back causing radiating pain down both legs, intermittently. When stepping on a soft surface, she gets same pain.     PMHx:   Past Medical History:   Diagnosis Date   ??? Asthma    ??? Diabetes mellitus (CMS Dx)    ??? Hypertension    ??? Multiple sclerosis (CMS Dx)     Dx 2000   ??? Neuromuscular disorder (CMS Dx)     MS - dx 6 yrs ago      Medications: She has a current medication list which includes the following prescription(s): albuterol, baclofen, blood sugar diagnostic, cholecalciferol (vitamin d3), ibuprofen, inhalational spacing device, lorazepam, lovastatin, metformin, oxybutynin, and rebif rebidose.  She is allergic to compazine [prochlorperazine edisylate] and prochlorperazine.  Work Status: (re: return to work Programmer, applications) - disability since 2013  Living Environment: accessible     Prior PT Treatment: not for this  issue  Prior Level of Function: x few months ago, no pain in legs, no difficulty with mobility  Current LOF (how issues affect ADLs/mobility): stepping on soft surfaces or wearing soft insoles in shoes; gave up line dancing  Precautions: MS, DM, decreased vision for reading (2 pair of glasses)  Patient Goal for Therapy: 'feel better'       Objective:       Observation:    Posture: anteriorly, leans laterally R; from side: trunk ext with slightly flexed hips/knees; Dowager's Hump, fwd shoulders, decreased lumbar lordosis   Mental Status:  alert, appears stated age and cooperative   Edema: pitting edema, +1    Vision/hearing: glasses     Pain:   Level (0-10): 6/10 at present;  8/10 at worst in past month - required heating pad and ibuprofen                           2/10 at best in past month   Location: L hip then down one leg or the other if stepping on something soft   Quality: constant and radiating     Relieved By:  sitting, standing, lying and heat    Exacerbated By: stepping on soft ground/surface    B LE   Strength: WFL   B LE Range of Motion: WFL EXCEPT:  (degrees) L piriformis: unable to place L ankle onto R knee (tightness); R piriformis tight though can place R ankle on L knee. L supine SLR 0-60 deg with pain in L hip; R SLR 0-30 deg with pain in R groin/low back    Balance:   - Sitting WFL  - Standing WFL  Coordination:  no dysmetria or tremor noted  Sensation:  impaired - due to radiating pain  Functional Tasks/mobility: independent though avoids past routines such as line dancing  Specialized Tests: TUG 13, no AD; Oswestry Score 19/45 = 42% disability    Treatment provided (time bill code/minutes):  Therapeutic Exercise: 97110, 6 minutes: pelvic tilts in hook lying      Learning Assessment:  Patient is able to communicate with therapist and verbalize understanding of directions/instructions     [x]  Yes   []  No  Patient is unable to communicate with therapist and/or verbalize understanding  of  directions/understanding due to the following barriers to learning:  [x]  N/A  []  Reading   []  Language   []  Visual   []  Hearing   []  Other:  Is an interpreter required? []  Yes  [x]  No    Assessment:   Assessment/Problem List: Patient presents with at least 3 months of radicular pain from L hip to BLEs contributing to functional limitations including decreased activity level and constant pain with intermittent radiation. Patient would benefit from skilled PT to address the aforementioned deficits.     Patient/Family received education on the purpose of therapy, participated in the development of the POC and verbalized understanding and agreement of POC, goals    Rehabilitation Potential: Based on this therapist's assessment, [name] has the following rehabilitation potential for the PT goals stated below: : good    Plan Of Care:     Patient/Family Goals: By discharge, in agreement with patient:    Short Term Goals: time frame 3 wks status   Pt will demonstrate/show compliance with a home exercise program based on therapy recommendations to maintain benefits gained in physical therapy.   Pt without any HEP    patient will reduce pain to 5 /10 on 0-10 pain scale to allow increased comfort during mobility. Pain ranges from 2/10 to 8/10, depending on her activity     longTerm Goals: time frame 5 wks status   patient will improve Oswestry Questionnaire test by 25% to reflect improved mobility and comfort. 42%  (19/45)   patient will reduce pain to 0/10 on 0-10 pain scale to allow improved mobility and comfort with daily activities; allow client to return to line dancing (starting off lightly). Pain ranges from 2/10 to 8/10, depending on her activity     Plan:    Treatment to Include: Therapeutic Exercise: 97110, Manual Therapy: 97140 and PT Moderate Complexity Eval: 16109     Patient/Family Agree to Treatment: Yes      Treatment Diagnosis (ICD-9 Code):   1. Pain in both lower extremities           Therapy  Frequency/Duration: Patient to receive skilled PT services up to 2 times per week for up to 5 weeks or up to 10 visits starting from the first scheduled follow up visit within this plan of care.      Certification Period: 07/28/2017 - 60  days    Therapist Signature: Marlon Pel, PT  Date: 07/28/2017    Physician Certification   I certify that the above patient is under my care an requires the above services. These professional services are to be provided from an established plan, related to the diagnosis and reviewed by me every 90 days.     Additional comments/revisions:     Physician Name: Raeanne Gathers, MD  Signature_______________________________________ Date: _______    Medicare G-Code Primary Functional Limitation    []  Mobility [] Change Position []  Carry, Moving and Handling []  Self-care [x]  Other 1 []  Other 2   Current: []  CH 0% []  CI 1-19% [x]  CJ 20-39% []  CK 39-59% []  CL 60-79% []  CM 80-99% []  CN 100%   Goal: []  CH 0% [x]  CI 1-19% []  CJ 20-39% []  CK 39-59% []  CL 60-79% []  CM 80-99% []  CN 100%   D/C: []  CH 0% []  CI 1-19% []  CJ 20-39% []  CK 39-59% []  CL 60-79% []  CM 80-99% []  CN 100%    Reported on visit#: 1   Determining Criteria: level of pain   Next G-Code Report due by Visit #: 10    Evaluation Code Matrix - ALL INSURANCE CARRIERS  History:  Number of personal factors and comorbidities (includes relevant medical complications, complicating behaviors/beliefs, communication issues, mentation, etc):   Past Medical History:   Diagnosis Date   ??? Asthma    ??? Diabetes mellitus (CMS Dx)    ??? Hypertension    ??? Multiple sclerosis (CMS Dx)     Dx 2000   ??? Neuromuscular disorder (CMS Dx)     MS - dx 6 yrs ago      [] 0       97161    [] 1-2 97162               [x] >=3 [phone]   Examination Elements  Elements include body structures/systems and function including psychological activtiy limitation, and or participation limitations.    [] 1-2 elements 97161            [x] 3 elements 97162        [] =>4 elements 9716   Clinical  Presentation  []  97161 Stable (unchanging, uncomplicated, predicted rate of recovery)    [x]  97162 Evolving (changing clinical characteristics (improving/regressing,)   []  97163 Unstable (unpredictable characteristics,(fluctuating pain, tone, function, BP response, etc)   Evaluation Code  []  Low Complexity 97161  [x]  Moderate Complexity 97162      []  High Complexity [phone]

## 2017-07-28 NOTE — Unmapped (Signed)
Diagnosis:   1. Pain in both lower extremities           Referring Provider:Zabeti, Laqueta Due, MD  Insurance plan:   Payor/Plan Subscr DOB Sex Sub. Ins. ID Effective Group Num   1. [facility]* Melanie [name], Melanie [name] Nov 08, 1956 Female [id number] 01/27/17 10304                       # of visits per insurance authorization:07/26/2017//AU/APS/[id number]/BENEFIT AUTH INFO  PT,OT,SP- PLAN UHC MEDICARE AARP MEDICARE COMPLETE HMO  POL EFF DT: ??2.1.18  V ARE BOMN FOR ALL 3 SERVICES  NO PC IS REQ  NO REF REQ   MODALITY LIMIT? 4  DED:$0 ????????OOP:$6000 ??????MET:$387.00  COPAY:$40 PER EACH IND SERV PER DAY ????  CO-INS: ??????100%  NO YRLY/LTM/PRE-EXISTING   DRAKE IS IN NETWORK  PER: ??[facility]  REF# ????[id number]  PHONE# [phone]    # of visits per POC: 10     Date of Initial Eval: 07/28/17  Date of last POC: 07/28/17    Verified Signed POC:    YES      NO           Date:    Rerouted to Referral Source for signature via     EM[facility] Fax   Mail    Date:  Routed to PCP (if other than referring) for signature via                                  EMR Fax    Mail   Date:    Goals of Therapy:  Short Term Goals: time frame 3 wks status   Pt will demonstrate/show compliance with a home exercise program based on therapy recommendations to maintain benefits gained in physical therapy.   Pt without any HEP    patient will reduce pain to 5 /10 on 0-10 pain scale to allow increased comfort during mobility. Pain ranges from 2/10 to 8/10, depending on her activity     longTerm Goals: time frame 5 wks status   patient will improve Oswestry Questionnaire test by 25% to reflect improved mobility and comfort. 42%  (19/45)   patient will reduce pain to 0/10 on 0-10 pain scale to allow improved mobility and comfort with daily activities; allow client to return to line dancing (starting off lightly). Pain ranges from 2/10 to 8/10, depending on her activity   _____________________________________________________________________  Pain:     /10     Location:    Precautions: MS,  DM, special reading glasses    Activity Date:   07/28/17 Date:  Date: Date     Date  Date  Date  Date  Date  Date     Visits    1   Eval       8  10   G-code  Other CJ, CI 1       8  Re-eval  10  g-code due        CPT code  Daily treatment record Timed Coded Treatment Minutes   PT Moderate Complexity Eval: 42595 Completed 07/28/17 39  Min.   Therapeutic Exercise: 97110 pelvic tilts in hook lying - established as initial HEP  6 min (NC)   Manual Therapy: 97140          RESPONSE TO THERAPY       Total Treatment TIme:  45 min.     PLAN: (1) progress  HEP; (2) reduce radiculopathy through stretching and core strengthening     Additional Information:      Education Addressed During This Session: 07/28/17: Pt educated on findings from initial evaluation and concept of HEP during evaluation. Taught PPTs as first exercise for HEP

## 2017-07-29 NOTE — Telephone Encounter (Signed)
Patient needs her medication called in for OAB. The Oxybutynin doesn't work. [facility] [phone]

## 2017-07-29 NOTE — Unmapped (Signed)
Spoke with patient who was unsure why Dr. Ree Shay prescribed her Ditropan. Patient states she has been on this medication in the past and it never helped with her symptoms. Informed me she told Dr. Ree Shay during her appt that this medication wasn't going to work and wanted to try something else. Will send message to [facility] and contact patient back

## 2017-07-29 NOTE — Unmapped (Signed)
Pt states she will not get paid this month due to [facility] form not being completed. Pt states she spoke with the MA who sent her the second set of forms. Pt states she need the first set of forms. Pt also stressed the point that she will not get paid this month if this information is not completed and turned in. Please advise.

## 2017-07-29 NOTE — Unmapped (Signed)
Per Victorino Dike called in Vesicare 5 mg tablets #30 with 2 refills to [facility]

## 2017-07-29 NOTE — Unmapped (Signed)
Please advise on phone note. Patient wants something other than the Ditropan

## 2017-08-01 NOTE — Unmapped (Signed)
Spoke to patient and informed her that her paperwork was ready and she asked that we fax it to the insurance [employer] and she will be by to pick up the paperwork sometime this week.

## 2017-08-03 NOTE — Telephone Encounter (Signed)
Patient requesting to speak to a diabetes educator requesting to schedule an appt for education.    Patient can be reached at [phone]

## 2017-08-03 NOTE — Unmapped (Signed)
Called patient at [phone] as requested.  Left a message on her voice mail to contact her PCP office to request a referral for diabetes education.  Once the referral has been sent by the PCP she can contact [phone] option 3 to schedule an appointment

## 2017-08-04 ENCOUNTER — Telehealth

## 2017-08-04 NOTE — Telephone Encounter (Signed)
Pt called and said she need a [facility] and to fax it to       Fax:  [phone]

## 2017-08-04 NOTE — Telephone Encounter (Signed)
Need more information, please contact pt.    Does she have a name of provider?  Need a Reason for Referral?

## 2017-08-05 NOTE — Telephone Encounter (Signed)
Lm to inform pt referral has been sent.

## 2017-08-05 NOTE — Telephone Encounter (Signed)
Judy Dimuzio - [facility]       Reason - She has a disc on lower spine pressing on sciatica nerve? She had an MRI done.     They recommended a nutritionist and physical therapy as well.     Please advise, Thank you

## 2017-08-05 NOTE — Telephone Encounter (Signed)
Referral made for nutritional counseling for wt loss, I presume.

## 2017-08-11 ENCOUNTER — Encounter: Admit: 2017-08-11 | Discharge: 2017-08-11 | Payer: Medicare (Managed Care)

## 2017-08-11 DIAGNOSIS — M79605 Pain in left leg: Secondary | ICD-10-CM

## 2017-08-11 NOTE — Unmapped (Signed)
Diagnosis:   1. Pain in both lower extremities           Referring Provider:Zabeti, Laqueta Due, MD  Insurance plan:   Payor/Plan Subscr DOB Sex Sub. Ins. ID Effective Group Num   1. [facility]* BALI, LYN 1956-03-22 Female [id number] 01/27/17 10304                       # of visits per insurance authorization:07/26/2017//AU/APS/[id number]/BENEFIT AUTH INFO  PT,OT,SP- PLAN UHC MEDICARE AARP MEDICARE COMPLETE HMO  POL EFF DT: ??2.1.18  V ARE BOMN FOR ALL 3 SERVICES  NO PC IS REQ  NO REF REQ   MODALITY LIMIT? 4  DED:$0 ????????OOP:$6000 ??????MET:$387.00  COPAY:$40 PER EACH IND SERV PER DAY ????  CO-INS: ??????100%  NO YRLY/LTM/PRE-EXISTING   DRAKE IS IN NETWORK  PER: ??[facility]MA  REF# ????[id number]  PHONE# [phone]    # of visits per POC: 10     Date of Initial Eval: 07/28/17  Date of last POC: 07/28/17    Verified Signed POC:    YES      NO           Date:    Rerouted to Referral Source for signature via     EMR Fax   Mail    Date:  Routed to PCP (if other than referring) for signature via                                  EMR Fax    Mail   Date:    Goals of Therapy:  Short Term Goals: time frame 3 wks status   Pt will demonstrate/show compliance with a home exercise program based on therapy recommendations to maintain benefits gained in physical therapy.   Pt without any HEP    patient will reduce pain to 5 /10 on 0-10 pain scale to allow increased comfort during mobility. Pain ranges from 2/10 to 8/10, depending on her activity     longTerm Goals: time frame 5 wks status   patient will improve Oswestry Questionnaire test by 25% to reflect improved mobility and comfort. 42%  (19/45)   patient will reduce pain to 0/10 on 0-10 pain scale to allow improved mobility and comfort with daily activities; allow client to return to line dancing (starting off lightly). Pain ranges from 2/10 to 8/10, depending on her activity   _____________________________________________________________________  Pain:    4 /10     Location: low back & R  hip    Precautions: MS, DM, special reading glasses    Activity Date:   07/28/17 Date:   08/11/17 Date: Date     Date  Date  Date  Date  Date  Date     Visits    1   Eval 2      8  10    G-code  Other CJ, CI 1 2      8   Re-eval  10  g-code due        CPT code  Daily treatment record Timed Coded Treatment Minutes   PT Moderate Complexity Eval: 82956 Completed 07/28/17    Therapeutic Exercise: 97110 Initiated and instructed the the pt in the following interventions to increase strength, stability, and to ease functional mobility with ADLs:    --SKTC/B piriformis/Standing B IT Band stretches 2 x 30H each  --PPT + knee fall outs x 10 each  --Henreitta Leber  10 x 5H-cues to engage core and glutes  --PPT + B SLR x 15-cues to engage quads, point toes  --PPT + hip Add isom 10 x 10H-vc's for steady pace  --Clam shells with core stab x 15  --SL hip Abd with core stab x 15-vc's to engage quads    30'   Manual Therapy: 97140 Applied STM using foam roller to R hip and IT Band for increased circulation, ROM/extensibility, and for pain reduction to ease functional mobility with ADLs. 8'        RESPONSE TO THERAPY Pt requires max cues to move at a steady pace as pt tends to move quickly through the movements with incorrect technique. Pt reports decreased low back pain 3/10 and no pain to R hip post session     Total Treatment TIme:  38 min.     PLAN: (1) progress HEP; (2) reduce radiculopathy through stretching and core strengthening     Additional Information:    08/11/17: Pt states she is tired from resting    Education Addressed During This Session:   08/11/17: Issued updated HEP  07/28/17: Pt educated on findings from initial evaluation and concept of HEP during evaluation. Taught PPTs as first exercise for HEP

## 2017-08-16 NOTE — Unmapped (Signed)
Pt with apparent no call/no show this session.    Monica Codd, PTA

## 2017-08-19 ENCOUNTER — Encounter: Payer: Medicare (Managed Care) | Attending: Rehabilitative and Restorative Service Providers"

## 2017-08-23 ENCOUNTER — Encounter
Admit: 2017-08-23 | Discharge: 2017-08-23 | Payer: Medicare (Managed Care) | Attending: Rehabilitative and Restorative Service Providers"

## 2017-08-23 DIAGNOSIS — M79605 Pain in left leg: Secondary | ICD-10-CM

## 2017-08-23 NOTE — Unmapped (Signed)
Diagnosis:   1. Pain in both lower extremities           Referring Provider:Zabeti, Laqueta Due, MD  Insurance plan:   Payor/Plan Subscr DOB Sex Sub. Ins. ID Effective Group Num   1. [facility]* Melanie [name], Melanie [name] July 22, 1956 Female [id number] 01/27/17 10304                       # of visits per insurance authorization:07/26/2017//AU/APS/[id number]/BENEFIT AUTH INFO  PT,OT,SP- PLAN UHC MEDICARE AARP MEDICARE COMPLETE HMO  POL EFF DT: ??2.1.18  V ARE BOMN FOR ALL 3 SERVICES  NO PC IS REQ  NO REF REQ   MODALITY LIMIT? 4  DED:$0 ????????OOP:$6000 ??????MET:$387.00  COPAY:$40 PER EACH IND SERV PER DAY ????  CO-INS: ??????100%  NO YRLY/LTM/PRE-EXISTING   DRAKE IS IN NETWORK  PER: ??EMMA  REF# ????[id number]  PHONE# [phone]    # of visits per POC: 10     Date of Initial Eval: 07/28/17  Date of last POC: 07/28/17    Verified Signed POC:    YES      NO           Date: 07/28/17   Rerouted to Referral Source for signature via     EMR Fax   Mail    Date: 07/28/17    Goals of Therapy:  Short Term Goals: time frame 3 wks status   Pt will demonstrate/show compliance with a home exercise program based on therapy recommendations to maintain benefits gained in physical therapy.   Pt without any HEP    patient will reduce pain to 5 /10 on 0-10 pain scale to allow increased comfort during mobility. Pain ranges from 2/10 to 8/10, depending on her activity     longTerm Goals: time frame 5 wks status   patient will improve Oswestry Questionnaire test by 25% to reflect improved mobility and comfort. 42%  (19/45)   patient will reduce pain to 0/10 on 0-10 pain scale to allow improved mobility and comfort with daily activities; allow client to return to line dancing (starting off lightly). Pain ranges from 2/10 to 8/10, depending on her activity   _____________________________________________________________________  Pain:    4 /10     Location: low back & R hip    Precautions: MS, DM, special reading glasses    Activity Date:   07/28/17 Date:   08/11/17 Date: 08/23/17  Date     Date  Date  Date  Date  Date  Date     Visits    1   Eval 2 3     8  10    G-code  Other CJ, CI 1 2 3     8   Re-eval  10  g-code due        CPT code  Daily treatment record Timed Coded Treatment Minutes   PT Moderate Complexity Eval: 35573 Completed 07/28/17    Therapeutic Exercise: 97110  increase strength, core stability, functional endurance:  - NuStep, L2, x6 min. vc/monitoring for acceptable pace, set up, use of machine    FOLLOWED BY:   --SKTC stretches 2 x 30H R/L - using sheet   - R (only) piriformis positioning as stretch H30 x1, H15 x1  - LTR as stretch, 2 x 30H R/L    - PPT in hook lying, x10  - PPT holds, H3 x10  - PPT mini-bridges (tailbone up only), x10  - PPT with single leg marching, 2 x 5 R/L each  -  Clam shells with core stab x 15  - SL hip Abd with core stab x 15 44 min   Manual Therapy: 97140           RESPONSE TO THERAPY  UNable to tolerate piriformis stretch on L due to pain; pain with side lying R hip abduction - stopped both exercises - removed from HEP.  Pain with H30 with standing ITB stretch  No pain with new core exercises      Total Treatment TIme:  44 min.     PLAN: (1) progress HEP; (2) reduce radiculopathy through stretching and core strengthening     Additional Information:    08/11/17: Pt states she is tired from resting    Education Addressed During This Session:   08/23/17: UPDATED HEP - removed exercises resulting in pain, added easier core strengthening sequence in its place, encouraged client to use tennis ball for STM to ITBs; suggested if ITB stretches remain painful, to not stretch as far, ROM-wise and or HOLD shorter periods of time  08/11/17: Issued updated HEP  07/28/17: Pt educated on findings from initial evaluation and concept of HEP during evaluation. Taught PPTs as first exercise for HEP

## 2017-08-25 ENCOUNTER — Encounter: Admit: 2017-08-25 | Discharge: 2017-08-25 | Payer: Medicare (Managed Care)

## 2017-08-25 DIAGNOSIS — M79605 Pain in left leg: Secondary | ICD-10-CM

## 2017-08-25 NOTE — Unmapped (Signed)
Diagnosis:   1. Pain in both lower extremities           Referring Provider:Zabeti, Laqueta Due, MD  Insurance plan:   Payor/Plan Subscr DOB Sex Sub. Ins. ID Effective Group Num   1. [facility]* JUNEAU, DOUGHMAN 06-17-1956 Female [id number] 01/27/17 10304                       # of visits per insurance authorization:07/26/2017//AU/APS/[id number]/BENEFIT AUTH INFO  PT,OT,SP- PLAN UHC MEDICARE AARP MEDICARE COMPLETE HMO  POL EFF DT: ??2.1.18  V ARE BOMN FOR ALL 3 SERVICES  NO PC IS REQ  NO REF REQ   MODALITY LIMIT? 4  DED:$0 ????????OOP:$6000 ??????MET:$387.00  COPAY:$40 PER EACH IND SERV PER DAY ????  CO-INS: ??????100%  NO YRLY/LTM/PRE-EXISTING   DRAKE IS IN NETWORK  PER: ??EMMA  REF# ????[id number]  PHONE# [phone]    # of visits per POC: 10     Date of Initial Eval: 07/28/17  Date of last POC: 07/28/17    Verified Signed POC:    YES      NO           Date: 07/28/17   Rerouted to Referral Source for signature via     EMR Fax   Mail    Date: 07/28/17    Goals of Therapy:  Short Term Goals: time frame 3 wks status   Pt will demonstrate/show compliance with a home exercise program based on therapy recommendations to maintain benefits gained in physical therapy.   Pt without any HEP    patient will reduce pain to 5 /10 on 0-10 pain scale to allow increased comfort during mobility. Pain ranges from 2/10 to 8/10, depending on her activity     longTerm Goals: time frame 5 wks status   patient will improve Oswestry Questionnaire test by 25% to reflect improved mobility and comfort. 42%  (19/45)   patient will reduce pain to 0/10 on 0-10 pain scale to allow improved mobility and comfort with daily activities; allow client to return to line dancing (starting off lightly). Pain ranges from 2/10 to 8/10, depending on her activity   _____________________________________________________________________  Pain:    2 /10     Location: low back   Precautions: MS, DM, special reading glasses    Activity Date:   07/28/17 Date:   08/11/17 Date: 08/23/17  Date  08/25/17     Date  Date  Date  Date  Date  Date     Visits    1   Eval 2 3 4    8  10    G-code  Other CJ, CI 1 2 3 4    8   Re-eval  10  g-code due        CPT code  Daily treatment record Timed Coded Treatment Minutes   PT Moderate Complexity Eval: 82956 Completed 07/28/17    Therapeutic Exercise: 97110 Continued with the following to increase strength, core stability, functional endurance:  - NuStep, L2, x6 min.    --SKTC stretches 2 x 30H R/L - using sheet   - R (only) piriformis positioning as stretch H15 x2  - LTR as stretch, 2 x 30H R/L    - PPT in hook lying  x15  - PPT holds H3 x15  - PPT mini-bridges (tailbone up only), x15  - PPT with single leg marching 3 x 5 R/L each  - Clam shells with core stab x 20  - SL( L  hip only) Abd with core stab x 20 38 min   Manual Therapy: 97140           RESPONSE TO THERAPY Pt exhibited slight R hip pain while performing single leg march then dissipated. Pt reports no pain post session     Total Treatment TIme:  38 min.     PLAN: (1) progress HEP; (2) reduce radiculopathy through stretching and core strengthening     Additional Information:   08/25/17: Pt states she is not compliant with her daily HEP. Vendetta reports only low back pain today, no hip pain.   08/11/17: Pt states she is tired from resting    Education Addressed During This Session:   08/25/17: Issued HEP of tasks performed today.  08/23/17: UPDATED HEP - removed exercises resulting in pain, added easier core strengthening sequence in its place, encouraged client to use tennis ball for STM to ITBs; suggested if ITB stretches remain painful, to not stretch as far, ROM-wise and or HOLD shorter periods of time  08/11/17: Issued updated HEP  07/28/17: Pt educated on findings from initial evaluation and concept of HEP during evaluation. Taught PPTs as first exercise for HEP

## 2017-08-30 ENCOUNTER — Encounter
Admit: 2017-08-30 | Discharge: 2017-08-30 | Payer: Medicare (Managed Care) | Attending: Rehabilitative and Restorative Service Providers"

## 2017-08-30 DIAGNOSIS — G35 Multiple sclerosis: Secondary | ICD-10-CM

## 2017-08-30 NOTE — Unmapped (Signed)
Physical Therapy Treatment Note // PROGRESS NOTE                    Diagnosis:   1. Pain in both lower extremities           Referring Provider:Zabeti, Laqueta Due, MD  Insurance plan:   Payor/Plan Subscr DOB Sex Sub. Ins. ID Effective Group Num   1. [facility]* Melanie, [name] [birth year] Female [id number] 01/27/17 10304                       # of visits per insurance authorization:07/26/2017//AU/APS/[id number]/BENEFIT AUTH INFO  PT,OT,SP- PLAN UHC MEDICARE AARP MEDICARE COMPLETE HMO  POL EFF DT: ??2.1.18  V ARE BOMN FOR ALL 3 SERVICES  NO PC IS REQ  NO REF REQ   MODALITY LIMIT? 4  DED:$0 ????????OOP:$6000 ??????MET:$387.00  COPAY:$40 PER EACH IND SERV PER DAY ????  CO-INS: ??????100%  NO YRLY/LTM/PRE-EXISTING   DRAKE IS IN NETWORK  PER: ??EMMA  REF# ????[id number]  PHONE# [phone]    # of visits per POC: 10     Date of Initial Eval: 07/28/17  Date of last POC: 07/28/17, 08/30/17    Verified Signed POC:    YES      NO           Date: 07/28/17   Rerouted to Referral Source for signature via     EMR Fax   Mail    Date: 07/28/17    Goals of Therapy:  Short Term Goals: time frame 3 wks status   Pt will demonstrate/show compliance with a home exercise program based on therapy recommendations to maintain benefits gained in physical therapy.   Pt without any HEP   08/28/17 goal met - ongoing   patient will reduce pain to 5 /10 on 0-10 pain scale to allow increased comfort during mobility. Pain ranges from 2/10 to 8/10, depending on her activity  08/28/17 goal met  Increased pain this morning though very short lived (when first got up)     longTerm Goals: time frame 5 wks status   patient will improve Oswestry Questionnaire test by 25% to reflect improved mobility and comfort. 42%  (19/45)   patient will reduce pain to 0/10 on 0-10 pain scale to allow improved mobility and comfort with daily activities; allow client to return to line dancing (starting off lightly). Pain ranges from 2/10 to 8/10, depending on her activity    _____________________________________________________________________  Pain:    2 /10     Location: low back   Precautions: MS, DM, special reading glasses    Activity Date:   07/28/17 Date:   08/11/17 Date: 08/23/17 Date  08/25/17     Date 08/30/17 Date  Date  Date  Date  Date     Visits    1   Eval 2 3 4 5   8  10    G-code  Other CJ, CI 1 2 3 4 5   Re-eval   8    10  g-code due        CPT code  Daily treatment record Timed Coded Treatment Minutes   PT Moderate Complexity Eval: 82956 Completed 07/28/17    Therapeutic Exercise: 97110  increase strength, core stability, and functional endurance:  - NuStep, L2, x6 min. - pt fearful to go longer or raise resistance.   - LTR, 2 x 30H R/L  - SKTC stretches 2 x 30H R/L - using sheet; vc to extend other  LE  - R (only) piriformis positioning as stretch H30 x2    3-way hip exercise with core activation, x10 R/L 39 min   Manual Therapy: 97140           RESPONSE TO THERAPY -Pt fatigued after each set of exercise - especially 3-way hip ex.   - pt able to cross R ankle on L knee post STM without pain (painful and unable to reach same position before STM)    NEXT SESSION: encourage starting NuStep at L3 next session; review using tennis ball on both hips/ITB regions before stretches; cont. 3-way hip exercise bilaterally.       Total Treatment TIme:  39 min.     PLAN: (1) progress HEP; (2) reduce radiculopathy through stretching and core strengthening     Additional Information:   08/25/17: Pt states she is not compliant with her daily HEP. Melanie reports only low back pain today, no hip pain.   08/11/17: Pt states she is tired from resting    Education Addressed During This Session:   08/30/17: pt education re: tennis ball for STM to B lateral hips/thighs; pt demonstrated back correctly. Concern is her endurance to reach 5 min on each side  08/25/17: Issued HEP of tasks performed today.  08/23/17: UPDATED HEP - removed exercises resulting in pain, added easier core strengthening sequence in  its place, encouraged client to use tennis ball for STM to ITBs; suggested if ITB stretches remain painful, to not stretch as far, ROM-wise and or HOLD shorter periods of time  08/11/17: Issued updated HEP  07/28/17: Pt educated on findings from initial evaluation and concept of HEP during evaluation. Taught PPTs as first exercise for HEP

## 2017-09-01 ENCOUNTER — Encounter: Admit: 2017-09-01 | Discharge: 2017-09-01 | Payer: Medicare (Managed Care)

## 2017-09-01 DIAGNOSIS — G35 Multiple sclerosis: Secondary | ICD-10-CM

## 2017-09-01 NOTE — Progress Notes (Signed)
Physical Therapy Treatment Note // PROGRESS NOTE FUNCTIONAL CAPACITIES PERFORMANCE TESTING 09/01/17                   Diagnosis:   1. Multiple sclerosis (CMS Dx)     2. Pain in both lower extremities           Referring Provider:Zabeti, Laqueta Due, MD  Insurance plan:   Payor/Plan Subscr DOB Sex Sub. Ins. ID Effective Group Num   1. [facility]* TAKYLA, BOIK 19-Feb-1956 Female [id number] 01/27/17 10304                       # of visits per insurance authorization:07/26/2017//AU/APS/[id number]/BENEFIT AUTH INFO  PT,OT,SP- PLAN UHC MEDICARE AARP MEDICARE COMPLETE HMO  POL EFF DT: 2.1.18  V ARE BOMN FOR ALL 3 SERVICES  NO PC IS REQ  NO REF REQ   MODALITY LIMIT? 4  DED:$0 OOP:$6000 MET:$387.00  COPAY:$40 PER EACH IND SERV PER DAY   CO-INS: 100%  NO YRLY/LTM/PRE-EXISTING   DRAKE IS IN NETWORK  PER: [facility]  REF# [id number]  PHONE# [phone]    # of visits per POC: 10     Date of Initial Eval: 07/28/17  Date of last POC: 07/28/17, 08/30/17    Verified Signed POC:    YES      NO           Date: 07/28/17   Rerouted to Referral Source for signature via     EMR Fax   Mail    Date: 07/28/17    Goals of Therapy:  Short Term Goals: time frame 3 wks status   Pt will demonstrate/show compliance with a home exercise program based on therapy recommendations to maintain benefits gained in physical therapy.   Pt without any HEP   08/28/17 goal met - ongoing   patient will reduce pain to 5 /10 on 0-10 pain scale to allow increased comfort during mobility. Pain ranges from 2/10 to 8/10, depending on her activity  08/28/17 goal met  Increased pain this morning though very short lived (when first got up)     longTerm Goals: time frame 5 wks status   patient will improve Oswestry Questionnaire test by 25% to reflect improved mobility and comfort. 42%  (19/45)   patient will reduce pain to 0/10 on 0-10 pain scale to allow improved mobility and comfort with daily activities; allow client to return to line dancing (starting off lightly). Pain ranges from  2/10 to 8/10, depending on her activity   _____________________________________________________________________  Pain:    2 /10     Location: low back   Precautions: MS, DM, special reading glasses    Activity Date:   07/28/17 Date:   08/11/17 Date: 08/23/17 Date  08/25/17     Date 08/30/17 Date   09/01/17 Date  Date  Date  Date     Visits    1   Eval 2 3 4 5 6  8  10    G-code  Other CJ, CI 1 2 3 4 5   Re-eval 6   FCE  8    10  g-code due        CPT code  Daily treatment record Timed Coded Treatment Minutes   Physical Performance Test: 97750 Completed 09/01/17 see LETTER 75 min   Therapeutic Exercise: 97110   na   Manual Therapy: 97140           RESPONSE TO THERAPY Completed functional testing today.  Fatigue at end of session.    NEXT SESSION: encourage starting NuStep at L3 next session; review using tennis ball on both hips/ITB regions before stretches; cont. 3-way hip exercise bilaterally.       Total Treatment TIme:   75 min     PLAN: (1) progress HEP; (2) reduce radiculopathy through stretching and core strengthening     Additional Information:  09/01/17 completed FUNCTIONAL CAPACITIES EVALUATION, see LETTER this date.  08/25/17: Pt states she is not compliant with her daily HEP. Venia reports only low back pain today, no hip pain.   08/11/17: Pt states she is tired from resting    Education Addressed During This Session:   08/30/17: pt education re: tennis ball for STM to B lateral hips/thighs; pt demonstrated back correctly. Concern is her endurance to reach 5 min on each side  08/25/17: Issued HEP of tasks performed today.  08/23/17: UPDATED HEP - removed exercises resulting in pain, added easier core strengthening sequence in its place, encouraged client to use tennis ball for STM to ITBs; suggested if ITB stretches remain painful, to not stretch as far, ROM-wise and or HOLD shorter periods of time  08/11/17: Issued updated HEP  07/28/17: Pt educated on findings from initial evaluation and concept of HEP during evaluation.  Taught PPTs as first exercise for HEP

## 2017-09-02 ENCOUNTER — Encounter
Admit: 2017-09-02 | Discharge: 2017-09-02 | Payer: Medicare (Managed Care) | Attending: Rehabilitative and Restorative Service Providers"

## 2017-09-02 DIAGNOSIS — G35 Multiple sclerosis: Secondary | ICD-10-CM

## 2017-09-02 NOTE — Unmapped (Signed)
Physical Therapy Treatment Note                 Diagnosis:   1. Multiple sclerosis (CMS Dx)     2. Pain in both lower extremities           Referring Provider:Zabeti, Laqueta Due, MD  Insurance plan:   Payor/Plan Subscr DOB Sex Sub. Ins. ID Effective Group Num   1. [facility]* MARGALIT, LEECE 23-Nov-1956 Female [id number] 01/27/17 10304                       # of visits per insurance authorization:07/26/2017//AU/[id number]/BENEFIT AUTH INFO  PT,OT,SP- PLAN UHC MEDICARE AARP MEDICARE COMPLETE HMO  POL EFF DT: ??2.1.18  V ARE BOMN FOR ALL 3 SERVICES  NO PC IS REQ  NO REF REQ   MODALITY LIMIT? 4  DED:$0 ????????OOP:$6000 ??????MET:$387.00  COPAY:$40 PER EACH IND SERV PER DAY ????  CO-INS: ??????100%  NO YRLY/LTM/PRE-EXISTING   DRAKE IS IN NETWORK  PER: ??EMMA  REF# ????[id number]  PHONE# [phone]    # of visits per POC: 10     Date of Initial Eval: 07/28/17  Date of last POC: 07/28/17, 08/30/17    Verified Signed POC:    YES      NO           Date: 07/28/17   Rerouted to Referral Source for signature via     EMR Fax   Mail    Date: 07/28/17    Goals of Therapy:  Short Term Goals: time frame 3 wks status   Pt will demonstrate/show compliance with a home exercise program based on therapy recommendations to maintain benefits gained in physical therapy.   Pt without any HEP   08/28/17 goal met - ongoing   patient will reduce pain to 5 /10 on 0-10 pain scale to allow increased comfort during mobility. Pain ranges from 2/10 to 8/10, depending on her activity  08/28/17 goal met  Increased pain this morning though very short lived (when first got up)     longTerm Goals: time frame 5 wks status   patient will improve Oswestry Questionnaire test by 25% to reflect improved mobility and comfort. 42%  (19/45)   patient will reduce pain to 0/10 on 0-10 pain scale to allow improved mobility and comfort with daily activities; allow client to return to line dancing (starting off lightly). Pain ranges from 2/10 to 8/10, depending on her activity    _____________________________________________________________________  Pain:   0 /10     Location: low back     Precautions: MS, DM, special reading glasses    Activity Date:   07/28/17 Date:   08/11/17 Date: 08/23/17 Date  08/25/17     Date 08/30/17 Date 09/01/17 Date 09/02/17 Date  Date  Date     Visits    1   Eval 2 3 4 5 6 7 8  10    G-code  Other CJ, CI 1 2 3 4 5   Re-eval FCE   See add. note 7 8    10   g-code due      CPT code  Daily treatment record Timed Coded Treatment Minutes   PT Moderate Complexity Eval: 82956 Completed 07/28/17 ??   Therapeutic Exercise: 97110 NuStep L3, x6' with vc to keep steady pace and not go too fast or she fatigues    Stretching:   - SKTC 2 x 30H R/L - using sheet  - R/L piriformis figure-4 position, H30  x2  ??  3-way hip exercise with core activation, x10 R/L, single UE support; vc and tactile cues to remain upright when limb is moving (pt collapses spine)    Demonstration then practice with tennis ball for STM to R glut med region;  42 min   Manual Therapy: 97140   ??   ?? ?? ??   RESPONSE TO THERAPY No increase in pain    ??  NEXT SESSION: NuStep L3; progress reps with standing 3-way hip; ADD step ups, mini-squats, seated rows ??   ?? Total Treatment TIme:  42 min.       PLAN: (1) progress HEP; (2) reduce radiculopathy through stretching and core strengthening     Additional Information:   08/25/17: Pt states she is not compliant with her daily HEP. Arden reports only low back pain today, no hip pain.   08/11/17: Pt states she is tired from resting    Education Addressed During This Session:   08/30/17: pt education re: tennis ball for STM to B lateral hips/thighs; pt demonstrated back correctly. Concern is her endurance to reach 5 min on each side  08/25/17: Issued HEP of tasks performed today.  08/23/17: UPDATED HEP - removed exercises resulting in pain, added easier core strengthening sequence in its place, encouraged client to use tennis ball for STM to ITBs; suggested if ITB stretches remain  painful, to not stretch as far, ROM-wise and or HOLD shorter periods of time  08/11/17: Issued updated HEP  07/28/17: Pt educated on findings from initial evaluation and concept of HEP during evaluation. Taught PPTs as first exercise for HEP

## 2017-09-06 ENCOUNTER — Encounter: Admit: 2017-09-06 | Discharge: 2017-09-06 | Payer: Medicare (Managed Care)

## 2017-09-06 DIAGNOSIS — G35 Multiple sclerosis: Secondary | ICD-10-CM

## 2017-09-06 NOTE — Unmapped (Signed)
Physical Therapy Treatment Note                 Diagnosis:   1. Multiple sclerosis (CMS Dx)     2. Pain in both lower extremities           Referring Provider:Zabeti, Laqueta Due, MD  Insurance plan:   Payor/Plan Subscr DOB Sex Sub. Ins. ID Effective Group Num   1. [facility]* FLORENCIA, ZACCARO Apr 23, 1956 Female [id number] 01/27/17 10304                       # of visits per insurance authorization:07/26/2017//AU/APS/[id number]/BENEFIT AUTH INFO  PT,OT,SP- PLAN UHC MEDICARE AARP MEDICARE COMPLETE HMO  POL EFF DT: ??2.1.18  V ARE BOMN FOR ALL 3 SERVICES  NO PC IS REQ  NO REF REQ   MODALITY LIMIT? 4  DED:$0 ????????OOP:$6000 ??????MET:$387.00  COPAY:$40 PER EACH IND SERV PER DAY ????  CO-INS: ??????100%  NO YRLY/LTM/PRE-EXISTING   DRAKE IS IN NETWORK  PER: ??EMMA  REF# ????[id number]  PHONE# [phone]    # of visits per POC: 10     Date of Initial Eval: 07/28/17  Date of last POC: 07/28/17, 08/30/17    Verified Signed POC:    YES      NO           Date: 07/28/17   Rerouted to Referral Source for signature via     EMR Fax   Mail    Date: 07/28/17    Goals of Therapy:  Short Term Goals: time frame 3 wks status   Pt will demonstrate/show compliance with a home exercise program based on therapy recommendations to maintain benefits gained in physical therapy.   Pt without any HEP   08/28/17 goal met - ongoing   patient will reduce pain to 5 /10 on 0-10 pain scale to allow increased comfort during mobility. Pain ranges from 2/10 to 8/10, depending on her activity  08/28/17 goal met  Increased pain this morning though very short lived (when first got up)     longTerm Goals: time frame 5 wks status   patient will improve Oswestry Questionnaire test by 25% to reflect improved mobility and comfort. 42%  (19/45)   patient will reduce pain to 0/10 on 0-10 pain scale to allow improved mobility and comfort with daily activities; allow client to return to line dancing (starting off lightly). Pain ranges from 2/10 to 8/10, depending on her activity    _____________________________________________________________________  Pain:   1 /10     Location: low back     Precautions: MS, DM, special reading glasses    Activity Date:   07/28/17 Date:   08/11/17 Date: 08/23/17 Date  08/25/17     Date 08/30/17 Date 09/01/17 Date 09/02/17 Date   09/06/17 Date  Date     Visits    1   Eval 2 3 4 5 6 7 8  10    G-code  Other CJ, CI 1 2 3 4 5   Re-eval FCE   See add. note 7 8    10   g-code due      CPT code  Daily treatment record Timed Coded Treatment Minutes   PT Moderate Complexity Eval: 82956 Completed 07/28/17 ??   Therapeutic Exercise: 97110 Continued with the following to reduce pain and to ease functional mobility with ADLs:      - NuStep L3, x6' with vc to maintain steady pace so as not to fatigue  Stretching:   - SKTC 2 x 30H R/L - using sheet  - R/L piriformis figure-4 position, H30 x2  ??  - 3-way hip exercise with core activation inc'd to x15 R/L, single UE support w VI for upright posture, core stab-pt exhibits fatigue while performing this task    - Added squats w glute activation upon standing 10 x 5H  - Added FSUs on 4 step R/L x 15 each-cues to increase hip flexion  - Added seated rows using Red TB 10 x 5H each    40 min   Manual Therapy: 97140   ??   ?? ?? ??   RESPONSE TO THERAPY Pt reports pain free post session    ??  NEXT SESSION:  ??   ?? Total Treatment TIme:  40 min.       PLAN: (1) progress HEP; (2) reduce radiculopathy through stretching and core strengthening     Additional Information:   08/25/17: Pt states she is not compliant with her daily HEP. Narcissus reports only low back pain today, no hip pain.   08/11/17: Pt states she is tired from resting    Education Addressed During This Session:   09/06/17: Issued updated HEP  08/30/17: pt education re: tennis ball for STM to B lateral hips/thighs; pt demonstrated back correctly. Concern is her endurance to reach 5 min on each side  08/25/17: Issued HEP of tasks performed today.  08/23/17: UPDATED HEP - removed exercises  resulting in pain, added easier core strengthening sequence in its place, encouraged client to use tennis ball for STM to ITBs; suggested if ITB stretches remain painful, to not stretch as far, ROM-wise and or HOLD shorter periods of time  08/11/17: Issued updated HEP  07/28/17: Pt educated on findings from initial evaluation and concept of HEP during evaluation. Taught PPTs as first exercise for HEP

## 2017-09-15 ENCOUNTER — Encounter
Admit: 2017-09-15 | Discharge: 2017-09-15 | Payer: Medicare (Managed Care) | Attending: Rehabilitative and Restorative Service Providers"

## 2017-09-15 DIAGNOSIS — G35 Multiple sclerosis: Secondary | ICD-10-CM

## 2017-09-15 NOTE — Telephone Encounter (Signed)
Pt is calling to say that the referral that was requested was incorrect due the coding.The referral needs to specially have a diginoses code stating that the pt is a diabctic. The referral needs to be faxed to.    JUDY DIMUZIO  [address]   [place], [place]  [address]     FAX [phone]        THANKS

## 2017-09-15 NOTE — Unmapped (Signed)
Physical Therapy Treatment Note // DISCHARGE SUMMARY                Diagnosis:   1. Multiple sclerosis (CMS Dx)     2. Pain in both lower extremities           Referring Provider:Zabeti, Laqueta Due, MD  Insurance plan:   Payor/Plan Subscr DOB Sex Sub. Ins. ID Effective Group Num   1. [facility]* Melanie, [name] [birth year] Female [id number] 01/27/17 [id number]                       # of visits per insurance authorization:07/26/2017//AU/APS/786 [id number]/BENEFIT AUTH INFO  PT,OT,SP- PLAN UHC MEDICARE AARP MEDICARE COMPLETE HMO  POL EFF DT: ??2.1.18  V ARE BOMN FOR ALL 3 SERVICES  NO PC IS REQ  NO REF REQ   MODALITY LIMIT? 4  DED:$0 ????????OOP:$6000 ??????MET:$387.00  COPAY:$40 PER EACH IND SERV PER DAY ????  CO-INS: ??????100%  NO YRLY/LTM/PRE-EXISTING   DRAKE IS IN NETWORK  PER: ??EMMA  REF# ????[id number]  PHONE# [phone]    # of visits per POC: 10     Date of Initial Eval: 07/28/17  Date of last POC: 07/28/17, 08/30/17    Verified Signed POC:    YES      NO           Date: 07/28/17   Rerouted to Referral Source for signature via     EMR Fax   Mail    Date: 07/28/17    Goals of Therapy:  Short Term Goals: time frame 3 wks status   Pt will demonstrate/show compliance with a home exercise program based on therapy recommendations to maintain benefits gained in physical therapy.   Pt without any HEP   08/28/17 goal met - ongoing   patient will reduce pain to 5 /10 on 0-10 pain scale to allow increased comfort during mobility. Pain ranges from 2/10 to 8/10, depending on her activity  08/28/17 goal met  Increased pain this morning though very short lived (when first got up)     longTerm Goals: time frame 5 wks status   patient will improve Oswestry Questionnaire test by 25% to reflect improved mobility and comfort. 42%  (19/45)  09/15/17 goal met - 13/40  32%   patient will reduce pain to 0/10 on 0-10 pain scale to allow improved mobility and comfort with daily activities; allow client to return to line dancing (starting off lightly). Pain ranges from 2/10 to 8/10,  depending on her activity  09/15/17 goal met - 0/10 90% of the time. Once in a while a twinge is felt. Knows to walk after stretching and avoid soft surfaces   _____________________________________________________________________  Pain:   1 /10     Location: low back     Precautions: MS, DM, special reading glasses    Activity Date:   07/28/17 Date:   08/11/17 Date: 08/23/17 Date  08/25/17     Date 08/30/17 Date 09/01/17 Date 09/02/17 Date   09/06/17 Date 09/15/17    Visits    1   Eval 2 3 4 5 6 7 8 9    G-code  Other CJ, CI 1 2 3 4 5   Re-eval FCE   See add. note 7 8   9   Re-eval  g-code due  Discharge       CPT code  Daily treatment record Timed Coded Treatment Minutes   PT Moderate Complexity Eval: 96295 Completed 07/28/17 ??   Therapeutic Exercise:  16109  Reassessment completed this date; see POC above and assessment below  - pt education regarding why she would want to continue with her HEP to maintain and or improve her current status 22 min   Manual Therapy: 97140   ??   ?? ?? ??   RESPONSE TO THERAPY     ??   ?? Total Treatment TIme:  22 min.       PLAN: (1) progress HEP; (2) reduce radiculopathy through stretching and core strengthening     Additional Information:   09/15/17: sitting on the EOB is not comfortable - knows to sit on firm seat preferably with a firm back; working on getting a chair for her bedroom that is comfortable to replace sitting on the EOB.  Doing her HEP regularly;   08/25/17: Pt states she is not compliant with her daily HEP. [name] reports only low back pain today, no hip pain.   08/11/17: Pt states she is tired from resting    Education Addressed During This Session:   09/06/17: Issued updated HEP  08/30/17: pt education re: tennis ball for STM to B lateral hips/thighs; pt demonstrated back correctly. Concern is her endurance to reach 5 min on each side  08/25/17: Issued HEP of tasks performed today.  08/23/17: UPDATED HEP - removed exercises resulting in pain, added easier core strengthening sequence in its place,  encouraged client to use tennis ball for STM to ITBs; suggested if ITB stretches remain painful, to not stretch as far, ROM-wise and or HOLD shorter periods of time  08/11/17: Issued updated HEP  07/28/17: Pt educated on findings from initial evaluation and concept of HEP during evaluation. Taught PPTs as first exercise for HEP     Progress Summary:      # of visits completed: 9          Medication Reviewed: [x]  yes [] no    Assessment:  Melanie [name] has been seen for Physical Therapy with treatment including TherEx.  Melanie [name] has made progress towards goals demonstrating improvements in the following areas: indep with HEP, decline in pain, indep witha  precautions for herself (ex. Sitting too long, sitting on a soft surface, amb on uneven surfaces such as grass, near potholes).  This patient continues to have the following impairments: infrequent back pain and sx from MS      Recommendations and Treatment Plan:   Discharge patient from POC due to:  Goals met - pt prepared to work on her own    Physical Therapist Signature:  Marlon Pel, PT      Date:  09/15/2017  ______________________________________________________________________    Physician Certification  I certify that the above patient is under my care an requires the above services. These professional services are to be provided from an established plan, related to the diagnosis and reviewed by me every 90 days.   Additional comments/revisions:     Physician Name: Raeanne Gathers, MD    Signature___________________________________________________ Date:_______

## 2017-09-26 MED ORDER — LOVASTATIN 40 MG PO TABS
40 MG | ORAL_TABLET | ORAL | 0 refills | Status: DC
Start: 2017-09-26 — End: 2018-01-10

## 2017-09-26 NOTE — Telephone Encounter (Signed)
Due for follow up with Dr Rindi Beechy- please call them to schedule at their earliest convenience. (meds have been refilled this time)

## 2017-09-26 NOTE — Telephone Encounter (Signed)
Spoke to pt about her medications being refilled and that the pt is due for a follow up.      Thanks.

## 2017-10-17 MED ORDER — METFORMIN HCL 1000 MG PO TABS
1000 MG | ORAL_TABLET | ORAL | 0 refills | Status: DC
Start: 2017-10-17 — End: 2018-01-10

## 2017-10-17 NOTE — Telephone Encounter (Signed)
Please resend her metformin to the [facility] on [address].  I fixed in the computer.    Please advise    Thanks

## 2017-10-17 NOTE — Telephone Encounter (Signed)
Called pharmacy to have them send to correct store.

## 2017-10-17 NOTE — Telephone Encounter (Signed)
Due for follow up with Dr Mica Releford- please call them to schedule at their earliest convenience. (meds have been refilled this time)

## 2018-01-10 NOTE — Telephone Encounter (Signed)
Patient need called and said that she need all medication refills for the following:  Baclofen  Test Strips  Lovastatin   Metformin  Oxybutynin  Lyrica  Inhaler and her BP medication which I did not see listed. She would like for you to call it into her [facility] which has been confirmed. Thanks.

## 2018-01-11 MED ORDER — GLUCOSE BLOOD VI STRP
Freq: Every day | 0 refills | Status: DC
Start: 2018-01-11 — End: 2018-01-12

## 2018-01-11 MED ORDER — BACLOFEN 10 MG PO TABS
10 MG | ORAL_TABLET | ORAL | 0 refills | Status: AC
Start: 2018-01-11 — End: ?

## 2018-01-11 MED ORDER — LOVASTATIN 40 MG PO TABS
40 MG | ORAL_TABLET | ORAL | 0 refills | Status: DC
Start: 2018-01-11 — End: 2018-01-12

## 2018-01-11 MED ORDER — TRIAMTERENE-HCTZ 37.5-25 MG PO TABS
ORAL_TABLET | ORAL | 0 refills | Status: DC
Start: 2018-01-11 — End: 2018-01-12

## 2018-01-11 MED ORDER — ALBUTEROL SULFATE HFA 108 (90 BASE) MCG/ACT IN AERS
108 | RESPIRATORY_TRACT | 0 refills | Status: DC
Start: 2018-01-11 — End: 2018-01-12

## 2018-01-11 MED ORDER — OXYBUTYNIN CHLORIDE ER 10 MG PO TB24
10 | ORAL_TABLET | Freq: Every evening | ORAL | 0 refills | Status: DC
Start: 2018-01-11 — End: 2018-01-12

## 2018-01-11 MED ORDER — METFORMIN HCL 1000 MG PO TABS
1000 MG | ORAL_TABLET | ORAL | 0 refills | Status: DC
Start: 2018-01-11 — End: 2018-05-10

## 2018-01-11 MED ORDER — baclofen (LIORESAL) 10 MG tablet
10 | ORAL_TABLET | Freq: Three times a day (TID) | ORAL | 1 refills | Status: AC
Start: 2018-01-11 — End: 2018-06-14

## 2018-01-11 MED ORDER — LYRICA 75 mg capsule
75 | ORAL_CAPSULE | ORAL | 3 refills | Status: AC
Start: 2018-01-11 — End: 2018-02-11

## 2018-01-11 NOTE — Telephone Encounter (Signed)
Patient is over-due for follow up.  Please call them to schedule. Medicines will be refilled this time to allow for making an appointment.

## 2018-01-11 NOTE — Telephone Encounter (Signed)
Refilled Lyrica Rx, patient needs follow-up and return for safety monitoring

## 2018-01-11 NOTE — Telephone Encounter (Signed)
Spoke with pt scheduled follow up appt with Dr.Zabeti.  Lyrica script called into pharmacy.

## 2018-01-12 MED ORDER — LOVASTATIN 40 MG PO TABS
40 | ORAL_TABLET | ORAL | 0 refills | Status: DC
Start: 2018-01-12 — End: 2018-02-10

## 2018-01-12 MED ORDER — ALBUTEROL SULFATE HFA 108 (90 BASE) MCG/ACT IN AERS
108 | RESPIRATORY_TRACT | 0 refills | Status: AC
Start: 2018-01-12 — End: ?

## 2018-01-12 MED ORDER — GLUCOSE BLOOD VI STRP
Freq: Every day | 0 refills | Status: AC
Start: 2018-01-12 — End: ?

## 2018-01-12 MED ORDER — TRIAMTERENE-HCTZ 37.5-25 MG PO TABS
37.5-25 | ORAL_TABLET | ORAL | 0 refills | Status: DC
Start: 2018-01-12 — End: 2018-03-20

## 2018-01-12 MED ORDER — OXYBUTYNIN CHLORIDE ER 10 MG PO TB24
10 | ORAL_TABLET | Freq: Every evening | ORAL | 0 refills | Status: DC
Start: 2018-01-12 — End: 2018-02-15

## 2018-01-12 NOTE — Telephone Encounter (Signed)
Spoke to pt about her medications being refilled and I told pt that she will need a follow up appt.    Thanks.

## 2018-01-18 ENCOUNTER — Encounter: Attending: Family Medicine

## 2018-01-19 MED ORDER — REBIF REBIDOSE 44 mcg/0.5 mL PnIj
44 | INJECTION | SUBCUTANEOUS | 3 refills | Status: AC
Start: 2018-01-19 — End: 2018-12-25

## 2018-01-25 ENCOUNTER — Encounter: Payer: MEDICARE | Attending: Family Medicine

## 2018-02-08 ENCOUNTER — Ambulatory Visit: Admit: 2018-02-08 | Discharge: 2018-02-08 | Payer: MEDICARE | Attending: Family Medicine

## 2018-02-08 ENCOUNTER — Encounter

## 2018-02-08 DIAGNOSIS — E113299 Type 2 diabetes mellitus with mild nonproliferative diabetic retinopathy without macular edema, unspecified eye: Secondary | ICD-10-CM

## 2018-02-08 LAB — COMPREHENSIVE METABOLIC PANEL
ALT: 103 U/L — ABNORMAL HIGH (ref 10–40)
AST: 66 U/L — ABNORMAL HIGH (ref 15–37)
Albumin/Globulin Ratio: 1.1 (ref 1.1–2.2)
Albumin: 4.3 g/dL (ref 3.4–5.0)
Alkaline Phosphatase: 94 U/L (ref 40–129)
Anion Gap: 16 (ref 3–16)
BUN: 18 mg/dL (ref 7–20)
CO2: 27 mmol/L (ref 21–32)
Calcium: 10.2 mg/dL (ref 8.3–10.6)
Chloride: 97 mmol/L — ABNORMAL LOW (ref 99–110)
Creatinine: 0.8 mg/dL (ref 0.6–1.2)
GFR African American: >60 (ref >60)
GFR Non-African American: >60 (ref >60)
Globulin: 3.8 g/dL
Glucose: 174 mg/dL — ABNORMAL HIGH (ref 70–99)
Potassium: 3.9 mmol/L (ref 3.5–5.1)
Sodium: 140 mmol/L (ref 136–145)
Total Bilirubin: <0.2 mg/dL (ref 0.0–1.0)
Total Protein: 8.1 g/dL (ref 6.4–8.2)

## 2018-02-08 LAB — HEMOGLOBIN A1C
Hemoglobin A1C: 8.2 %
eAG: 188.6 mg/dL

## 2018-02-08 LAB — CBC WITH AUTO DIFFERENTIAL
Basophils %: 0.4 %
Basophils Absolute: 0 10*3/uL (ref 0.0–0.2)
Eosinophils %: 1.5 %
Eosinophils Absolute: 0.1 10*3/uL (ref 0.0–0.6)
Hematocrit: 43.3 % (ref 36.0–48.0)
Hemoglobin: 14.5 g/dL (ref 12.0–16.0)
Lymphocytes %: 22.9 %
Lymphocytes Absolute: 2.1 10*3/uL (ref 1.0–5.1)
MCH: 31 pg (ref 26.0–34.0)
MCHC: 33.6 g/dL (ref 31.0–36.0)
MCV: 92.4 fL (ref 80.0–100.0)
MPV: 10.2 fL (ref 5.0–10.5)
Monocytes %: 7.8 %
Monocytes Absolute: 0.7 10*3/uL (ref 0.0–1.3)
Neutrophils %: 67.4 %
Neutrophils Absolute: 6 10*3/uL (ref 1.7–7.7)
Platelets: 211 10*3/uL (ref 135–450)
RBC: 4.68 M/uL (ref 4.00–5.20)
RDW: 13.2 % (ref 12.4–15.4)
WBC: 9 10*3/uL (ref 4.0–11.0)

## 2018-02-08 LAB — LIPID PANEL
Cholesterol, Total: 169 mg/dL (ref 0–199)
HDL: 61 mg/dL — ABNORMAL HIGH (ref 40–60)
LDL Calculated: 82 mg/dL (ref <100)
Triglycerides: 130 mg/dL (ref 0–150)
VLDL Cholesterol Calculated: 26 mg/dL

## 2018-02-08 NOTE — Progress Notes (Signed)
Subjective:      Patient ID: JAPLEEN TORNOW is a 62 y.o. female.    Blood pressure 128/88, pulse 111, height 5' 4" (1.626 m), weight 235 lb (106.6 kg), not currently breastfeeding.     HPI Here for routine follow up of DM.  Last seen in May 2018.  Feels OK. Has remained same weight. Continues all meds as below. Dr Lavonna Rua prescribes lyrica and baclofen for MS, which has been stable.    Uses metformin daily.  Am sugars at home run in the 120s usually.  Has retinopathy and sees [facility] annually.    Lab Results   Component Value Date    LABA1C 7.5 04/26/2017     Lab Results   Component Value Date    EAG 145.6 10/24/2015      maxzide 25 is for HTN. And she denies SE's.   No chest pains, dizziness, heart palpitations, dyspnea, lightheadedness, worsening edema.   Has normal renal function   Lab Results   Component Value Date    NA 138 04/08/2017    K 3.9 04/08/2017    BUN 14 04/08/2017    CREATININE 0.5 04/08/2017        Hyperlipidemia: on lova 40.  No myalgias.  No hx of CAD, TIA, CVA or PVD.  LFT's were elevated last time.    Lab Results   Component Value Date    CHOL 147 04/08/2017    TRIG 67 04/08/2017    HDL 62 (H) 04/08/2017    LDLCALC 72 04/08/2017     Lab Results   Component Value Date    ALT 105 (H) 04/08/2017    AST 94 (H) 04/08/2017          Patient Active Problem List   Diagnosis   . HTN (hypertension)   . Type 2 diabetes mellitus ([place]) (dx 2013)   . Overactive bladder- related to MS   . Multiple sclerosis ([place])- dx ~2000; Dr Lavonna [facility]ua at [facility]   . Fibromyalgia- dx by DR Candace Gallus    . OSA (obstructive sleep apnea)- has CPAP, but not using.   Marland Kitchen Hyperlipidemia   . 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 S/P colonoscopic polypectomyx5; 06/16/15; DR Sherlyn Lick   . Class 3 obesity due to excess calories with serious comorbidity and body mass index (BMI) of 40.0 to 44.9 in adult   . Fatty liver disease, nonalcoholic      Body mass index is 40.34  kg/m.    Wt Readings from Last 3 Encounters:   02/08/18 235 lb (106.6 kg)   04/26/17 239 lb (108.4 kg)   04/08/17 235 lb 14.3 oz (107 kg)      BP Readings from Last 3 Encounters:   02/08/18 128/88   04/26/17 134/82   04/08/17 (!) 164/94      Current Outpatient Prescriptions   Medication Sig Dispense Refill   . lovastatin (MEVACOR) 40 MG tablet TAKE ONE TABLET BY MOUTH DAILY 30 tablet 0   . blood glucose test strips (ACCU-CHEK AVIVA) strip 1 each by In Vitro route daily E11.9 Accucheck Nano 100 each 0   . albuterol sulfate HFA (PROVENTIL HFA) 108 (90 Base) MCG/ACT inhaler INHALE TWO PUFFS BY MOUTH EVERY 6 HOURS AS NEEDED FOR WHEEZING OR FOR SHORTNESS OF BREATH 1 Inhaler 0   . oxybutynin (DITROPAN-XL) 10 MG extended release tablet Take 1 tablet by mouth nightly 30 tablet 0   .  triamterene-hydrochlorothiazide (MAXZIDE-25) 37.5-25 MG per tablet TAKE ONE TABLET BY MOUTH DAILY 30 tablet 0   . metFORMIN (GLUCOPHAGE) 1000 MG tablet TAKE ONE TABLET BY MOUTH TWICE A DAY WITH MEALS 60 tablet 0   . baclofen (LIORESAL) 10 MG tablet TAKE ONE TABLET BY MOUTH THREE TIMES A DAY 90 tablet 0   . Blood Glucose Monitoring Suppl (ACCU-CHEK NANO SMARTVIEW) W/DEVICE KIT Use once daily. 1 kit 0   . Cholecalciferol (VITAMIN D3) 50000 UNITS CAPS Take by mouth once a week     . pregabalin (LYRICA) 75 MG capsule Take 75 mg by mouth       No current facility-administered medications for this visit.       Immunization History   Administered Date(s) Administered   . Pneumococcal Polysaccharide (Pneumovax23) 09/25/2014        Social History   Substance Use Topics   . Smoking status: Never Smoker   . Smokeless tobacco: Never Used   . Alcohol use Yes      Comment: occ mixed drinks.      Review of Systems As above     Objective:   Physical Exam   Constitutional: She is oriented to person, place, and time. She appears well-developed and well-nourished.   Neck: Normal range of motion. Neck supple. Carotid bruit is not present. No thyromegaly present.    Cardiovascular: Normal rate, regular rhythm, normal heart sounds and intact distal pulses.    No murmur heard.  Pulmonary/Chest: Effort normal and breath sounds normal. No respiratory distress. She has no wheezes. She has no rales.   Musculoskeletal: Normal range of motion. She exhibits no edema.   Feet normal; no lesions.  Sensation intact to SW monofilament and vibratory sense bilaterally with intact pulses.    Neurological: She is alert and oriented to person, place, and time.   Skin: Skin is warm and dry.   Psychiatric: She has a normal mood and affect. Her behavior is normal. Judgment and thought content normal.   Nursing note and vitals reviewed.      Assessment:      1. Type 2 diabetes mellitus with mild nonproliferative retinopathy without macular edema, without long-term current use of insulin, unspecified laterality ([place])  - a1c today is unknown- her a1c was rejected by our POCT machine.  Check as venipuncture.  adjust treatment as indicated.  We discussed diet and exercise at length today.  Recheck in 3 mo now.  - HM DIABETES FOOT EXAM  - POCT glycosylated hemoglobin (Hb A1C)    2. Encounter for screening mammogram for breast cancer  - MAM Digital Screen Bilateral [[id number]]; Future    3. Essential hypertension  - Blood pressure is at goal on current therapy; continue meds and monitoring. Regular physical activity and healthy diet (low sodium, multiple servings of produce daily) was recommended.  Renal function to be assessed yearly.   - COMPREHENSIVE METABOLIC PANEL; Future    4. Pure hypercholesterolemia  - Stable; continue lova 40  - Lipid Panel; Future    5. 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 Fatty liver disease, nonalcoholic  - discussed this as a feature of the over-fed state.  See below    7. Class 3 severe obesity due to excess calories with  serious comorbidity and body mass index (BMI) of 40.0 to 44.9 in adult [facility])  The  patient is asked to make an attempt to improve diet and exercise patterns to aid in medical management of this problem.  She is considering a low carbohydrate diet    8. Multiple sclerosis ([place])- dx ~2000; Dr Lavonna Rua at [place]; continue monitoring with neurology.            Plan:      F/u 3 mo        Paulene Floor, MD

## 2018-02-09 NOTE — Telephone Encounter (Signed)
Spoke with pt. Regarding message above.

## 2018-02-09 NOTE — Telephone Encounter (Signed)
Pt is calling about blood work done yesterday. She is concerned about her A1C that was done. She says she might need to be on iron pills or something to help with her being tired all the time. She says she is always tired and sleeps a lot. She would like someone to give her a call to address this.    Please advise    Thanks

## 2018-02-09 NOTE — Telephone Encounter (Signed)
Her CBC would tell if she was anemic. She is not anemic, so iron studies would not be warranted. Her HgA1c is her diabetes test. Dr. Briant CedarMattingly will be back in the office next week and will review her results and adjust her diabetes medication. I would prefer for Dr. Briant CedarMattingly to adjust medication as she has been working closely with him.

## 2018-02-10 MED ORDER — LOVASTATIN 40 MG PO TABS
40 | ORAL_TABLET | ORAL | 0 refills | Status: DC
Start: 2018-02-10 — End: 2018-06-05

## 2018-02-14 MED ORDER — PIOGLITAZONE HCL 15 MG PO TABS
15 MG | ORAL_TABLET | Freq: Every day | ORAL | 1 refills | Status: DC
Start: 2018-02-14 — End: 2018-03-03

## 2018-02-15 MED ORDER — OXYBUTYNIN CHLORIDE ER 10 MG PO TB24
10 | ORAL_TABLET | Freq: Every evening | ORAL | 0 refills | Status: DC
Start: 2018-02-15 — End: 2018-04-20

## 2018-02-15 NOTE — Telephone Encounter (Signed)
Patient is over-due for follow up.  Please call them to schedule. Medicines will be refilled this time to allow for making an appointment.

## 2018-02-16 NOTE — Telephone Encounter (Signed)
Called pt and she will call back to schedule appt

## 2018-03-03 MED ORDER — PIOGLITAZONE HCL 15 MG PO TABS
15 MG | ORAL_TABLET | Freq: Every day | ORAL | 1 refills | Status: DC
Start: 2018-03-03 — End: 2018-08-29

## 2018-03-03 NOTE — Telephone Encounter (Signed)
That's funny.  I distinctly remember sending the script for pioglitazone with her lab letter.  That's OK, I sent the rx electronically also.  Thanks for pending it.  please let her know.    She IS due for follow up in mid- May, however, for diabetes.  She has not yet made an appointment for that.  If she makes on now she would have a greater option of times.

## 2018-03-03 NOTE — Telephone Encounter (Signed)
I called patient back to inform her that Error was made as far as her scheduling this time.   She had stated that she just received a letter in the mail saying that Provider had switched medications. I asked her which meds and she said that she did not know and she want to find out and need clarification on that please.Thanks.

## 2018-03-03 NOTE — Telephone Encounter (Signed)
Spoke with pt. She stated she did not receive her Actos prescription. [facility] [address]. She also would like to know if she should continue the Metformin while taking the Actos.

## 2018-03-03 NOTE — Telephone Encounter (Signed)
Pt was called to make an appt but she was just in to see Dr. Briant Cedar on 2.13.2019.  She is wondering why she needs to come back.  She would like a call back to discuss with Dr. Briant Cedar on why she needs to come back  Please advise

## 2018-03-03 NOTE — Telephone Encounter (Signed)
I called patient back to inform her of message to make Diabetic follow up appointment for May. Patient stated that she would call office back when she is ready to schedule. Thanks.

## 2018-03-03 NOTE — Telephone Encounter (Signed)
Please help pt. Schedule appointment.

## 2018-03-03 NOTE — Telephone Encounter (Signed)
Please advise pt. To disregard she can do her usual 3 month follow-up in April. Thank you.

## 2018-03-03 NOTE — Telephone Encounter (Signed)
???    I have no idea.  Likely I made an error at the time of the refill.  Very sorry.

## 2018-03-17 NOTE — Telephone Encounter (Signed)
Pt called and said she has on pill left of the triamterene-hydrochlorothiazide and she said [facility] called this in and just wanted to make sure we can fill this today

## 2018-03-17 NOTE — Telephone Encounter (Signed)
Refill request routed to Dr.Mattingly.

## 2018-03-20 MED ORDER — TRIAMTERENE-HCTZ 37.5-25 MG PO TABS
ORAL_TABLET | ORAL | 1 refills | Status: AC
Start: 2018-03-20 — End: ?

## 2018-03-20 NOTE — Telephone Encounter (Signed)
Spoke with pt. regarding refill.

## 2018-03-20 NOTE — Telephone Encounter (Signed)
Pt is livid stating that she is out of triamterence-hydrochlorothiaze 37.5-25  This med was refused and she is very mad that she doesn't have this med.   Pt had an appt back in Feb 2019.   Please advise on why med was refused.

## 2018-03-20 NOTE — Telephone Encounter (Signed)
I am very confused about this.  It was refused because I thought I had already refilled it for Adahlia (a duplicate request).    Please apologize to Lilias for me and let her know this was inadvertent.  This is so strange because a similar thing happened with Marylu Lund last month!  I will have to bake her some cookies to make it up to her (sugar free, of course.)  I ordered a 90 day supply for the blood pressure medicine (with a refill).  Thanks.

## 2018-03-24 NOTE — Telephone Encounter (Signed)
Pt is requesting a brace for her back. She is in a lot of pain and states that her back hurts. She rates her pain at a 7. She states that she saw the dr last month for this reason so she does no feel she needs to see the dr.   She would need this sent to a specialty pharmacy.  She also states that she wants a flexible brace that she can move around in and she is a large women so something big enough for her.    Please advise    Thanks

## 2018-03-24 NOTE — Telephone Encounter (Signed)
Please see Dr Edd Arbour message below  Pt needs appt to address back pain  thanks

## 2018-03-24 NOTE — Telephone Encounter (Signed)
noted 

## 2018-03-24 NOTE — Telephone Encounter (Signed)
My training is not adequate for prescribing bracing and in general I don't recommend bracing.   Her last visit in Feb did not address back pain, but I am happy to see her for this or recommend someone if she prefers that.

## 2018-03-24 NOTE — Telephone Encounter (Signed)
Please offer pt. An appointment to address back pain.

## 2018-03-24 NOTE — Telephone Encounter (Signed)
Pt called back and she states that she is going to wait and see what her neuro says.    Please advise    Thanks

## 2018-03-24 NOTE — Telephone Encounter (Signed)
Pt does not want a brace but something that wraps around her torso to support her back. She calls it an elastic band or an ace bandage for her back.  She has MRI that was done back in 2017 and would like dr Briant Cedar to look at that.    Please advise    Thanks

## 2018-03-24 NOTE — Unmapped (Signed)
The patient called requesting a script for an ace bandage or a medication for her back pain until she see's MD. Please follow up.

## 2018-03-24 NOTE — Unmapped (Signed)
Spoke with patient, she is requesting a back support/brace like the ones they wear when moving heavy items. She said if you sent in a prescription to [facility] it would be covered. I informed her that I would let you know, but since it is Friday evening. I will put the new pharmacy in EPIC. [facility] on [address], [phone]

## 2018-03-27 NOTE — Telephone Encounter (Signed)
New [facility] pharmacy added to her profile.

## 2018-04-04 ENCOUNTER — Ambulatory Visit: Admit: 2018-04-04 | Payer: Medicare (Managed Care) | Attending: Neurology

## 2018-04-04 ENCOUNTER — Other Ambulatory Visit: Admit: 2018-04-04 | Payer: Medicare (Managed Care)

## 2018-04-04 DIAGNOSIS — G35 Multiple sclerosis: Secondary | ICD-10-CM

## 2018-04-04 LAB — CBC
Hematocrit: 44.8 % (ref 35.0–45.0)
Hemoglobin: 15.1 g/dL (ref 11.7–15.5)
MCH: 30.6 pg (ref 27.0–33.0)
MCHC: 33.6 g/dL (ref 32.0–36.0)
MCV: 91 fL (ref 80.0–100.0)
MPV: 10.7 fL (ref 7.5–11.5)
Platelets: 220 10*3/uL (ref 140–400)
RBC: 4.92 10*6/uL (ref 3.80–5.10)
RDW: 12.9 % (ref 11.0–15.0)
WBC: 9.5 10*3/uL (ref 3.8–10.8)

## 2018-04-04 LAB — DIFFERENTIAL
Basophils Absolute: 48 /uL (ref 0–200)
Basophils Relative: 0.5 % (ref 0.0–1.0)
Eosinophils Absolute: 105 /uL (ref 15–500)
Eosinophils Relative: 1.1 % (ref 0.0–8.0)
Lymphocytes Absolute: 3040 /uL (ref 850–3900)
Lymphocytes Relative: 32 % (ref 15.0–45.0)
Monocytes Absolute: 684 /uL (ref 200–950)
Monocytes Relative: 7.2 % (ref 0.0–12.0)
Neutrophils Absolute: 5624 /uL (ref 1500–7800)
Neutrophils Relative: 59.2 % (ref 40.0–80.0)
nRBC: 0 /100 WBC (ref 0–0)

## 2018-04-04 LAB — COMPREHENSIVE METABOLIC PANEL, SERUM
ALT: 58 U/L (ref 7–52)
AST (SGOT): 58 U/L (ref 13–39)
Albumin: 4.6 g/dL (ref 3.5–5.7)
Alkaline Phosphatase: 50 U/L (ref 36–125)
Anion Gap: 12 mmol/L (ref 3–16)
BUN: 21 mg/dL (ref 7–25)
CO2: 29 mmol/L (ref 21–33)
Calcium: 10.3 mg/dL (ref 8.6–10.3)
Chloride: 95 mmol/L (ref 98–110)
Creatinine: 0.97 mg/dL (ref 0.60–1.30)
Glucose: 92 mg/dL (ref 70–100)
Osmolality, Calculated: 285 mOsm/kg (ref 278–305)
Potassium: 4.7 mmol/L (ref 3.5–5.3)
Sodium: 136 mmol/L (ref 133–146)
Total Bilirubin: 0.3 mg/dL (ref 0.0–1.5)
Total Protein: 8.4 g/dL (ref 6.4–8.9)
eGFR AA CKD-EPI: 73 See note.
eGFR NONAA CKD-EPI: 63 See note.

## 2018-04-04 LAB — VITAMIN B12: Vitamin B-12: 359 pg/mL (ref 180–914)

## 2018-04-04 LAB — HEMOGLOBIN A1C: Hemoglobin A1C: 7.3 % (ref 4.8–6.4)

## 2018-04-04 LAB — VITAMIN D 25 HYDROXY: Vit D, 25-Hydroxy: 100 ng/mL (ref 30.0–100)

## 2018-04-04 NOTE — Unmapped (Signed)
Portions of this note have been copied forward from the note written by me or DNP in past but reviewed in their entirety and are accurate to date. I have reviewed and updated the history, physical exam, data, assessment, and plan of the note so that it reflects the evaluation and management of the patient by me on 04/04/2018      Date Symptom   Onset/Outcome Brain MRI Spine MRI Tests DMT period A/E Meds   2000 Fatigue, cognitive G/P        2004 Blurry vision B/L G/I  Dx Melanie    Rebif IVMP   2013  Stable 04-now  PV -ve      03/2014  09/2014 S 3/15 R leg W after cold    4/15 S       12/2014  9/16 S       12/2015  02/2016     9/17 S      06/2017                  Onset: Acute/Gradual , Outcome: Resolved/Improved/Progressed, MRI: Peri Ventricular/Juxta Cortical,      HPI:      04/04/18:  She feels about the same, denies new symptom or progression of old symptom, no recent fall, no recent UTI or other infection, reports adherence to treatment.    She has a cold, back issue is fine improved after lost some weight, Melanie is stable and not progressed.     07/06/17:  She feels about the same, denies new symptom or progression of old symptom, no recent fall, no recent UTI or other infection, report adherence to her treatment.      02/25/16 Dr. Rolm Baptise:   Melanie [name] is here for evaluation of return of shooting pain in the right leg with difficulty walking and went to the Clinic where she was started on Antibiotics (Bactrim x 7 days) and given pain meds, Norco as well as Valium for spasms with improvement 3 days later.  After a week symptoms resolved and she returned to baseline until just yesterday when she started having the shooting pain  and right leg spasm again which affects her walking.  She is still on Rebif and takes it regularly with no missed doses.    01/13/16:  She denies any new symptoms or exacerbation except worsening of fatigue after stopping high dose vitamin D. Her family doctor recommended taking OTC but that does not help.          07/15/15 Melanie [name]:  Melanie [name] is here for evaluation of worsening symptoms.  She reports that she has noticed that she has increased trouble walking when she gets up in the morning, she feels like she is a little of balance.  Once she has walked to the bathroom she is back to baseline.  She has also noticed that she has some pain in her right leg from halfway down her thigh to her calf which occurs every now and then for about 1 month. This has been going on for about 3 weeks, she otherwise has no other new symptoms.    01/08/15:  She thinks overall is stable, denies any new symptom or exacerbation. She received a steroid shot for knee pain which helped.      10/01/14:  I had the pleasure of evaluating Melanie [name] who is here for evaluation of new symptoms.  She is on Rebif since 2004 and take is regulalry with no missed doses.  She developed new  pain in her left knee and left leg, She also noticed increased numbness in that extremity which lasted about 3 days and it has started to improve.    04/09/14 Dr. Genelle Gather  On 4/2, she woke up and couldn't walk, felt her R leg was weak. Also lost sensation with tingling. She waited to see if this would get better. She called on 4/6 and Dr. Genelle Gather thought she is probably having exacerbation. Labs were done with CBC, BMP, UA which did not show evidence of infection. Nurse visited her at home to get IV access. IV solumedrol was started on 4/12. She got the second dose today. Since about Friday last week, sensation was coming back. Weakness is improved but not back to baseline. She is able to walk. Dancing is still hard. She is still on Rebif, doing ok with the injection. This is a first real exacerbation since 2004.   Recent exacerbation happened after recent URI.    02/27/13  She was working as OR Haematologist at [employer]. She was feeling very tired with some cognitive and memory problem around 2000. Thne she had an episode of visual change in both eye in 2004 with partial recovery.  She was Dx with Melanie based on MRI. She was started on Rebif . She thinks it works and tolerating it well however does not like injection. Her last MRI was from April 2013.   Last year she lost 25 Lbs by diet and exercise after she was diagnosed with Diabetes but she regain that weight. She is planning to go back in diet this week.     I reviewed Dr. Richardson Chiquito note from 03/09/2012:  I had the pleasure of re-evaluating Melanie [name], a 62 year old AA female with RRMS, for a return visit in the Melanie clinic. Pt is currently on Rebif and is tolerating it well. She has not had an MRI since January 2012. Since pts last visit she feels her condition is getting worse. She is experiencing cognitive issues including concentration and memory issues. She has also started to urinate uncontrollably and has missed work as a result. Pt is having trouble walking due to pain in both knees and L hip pain. She also complains of stress at work which causes her to be depressed.   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 Pt is currently on Rebif and is tolerating it well. She has not had an MRI since January 2012. Since pts last visit she feels her condition is getting worse. She is experiencing cognitive issues including concentration and memory issues. She has also started to urinate uncontrollably and has missed work as a result. Pt is having trouble walking due to pain in both knees and L hip pain. She also complains of stress at work which causes her to be depressed.   Due to pts complaints of cognitive issues, we will refer her for a neuropsych eval. We will also give her the contact number to the social worker for the [facility]. I would like to obtain and MRI of the brain and c spine.    Review of System:      Bladder incontinence   Nocturia   Weakness:  BLE when she first gets up  Pain:     R. Knee and hip, Steroid shot and Gabapentin   Sensory  Numbness r. foot  Fatigue:  bothersome  Sleep:        4 to 5 hrs  uninterrupted,   Sleep Apnea:  No longer uses CPAP    STM   Main symptom she get disability   Urinary Incontinece Oxybutinin helps, she has done pelvic exercise, this was one the reason she stopped working   Walking Difficulty Tired and back pain, can do more after exercise   Imbalance  Trip over but no fall   Fatigue                        Exhausted    Life Style:      Living Situation: With daughters family  Diet:  Diet  Exercise: Line Dance as exercise 4 days a week   Vitamin D: 50 K weekly     Medications:      Current Outpatient Prescriptions   Medication Sig   ??? albuterol Inhale 2 puffs into the lungs. Every 4 to 6 hours as needed.    ??? baclofen Take 1 tablet (10 mg total) by mouth 3 times a day.   ??? blood sugar diagnostic Test blood sugars up to twice a day   ??? cholecalciferol (vitamin D3) Take 1 capsule (50,000 Units total) by mouth every 7 days.   ??? ibuprofen Take 1 tablet (800 mg total) by mouth every 8 hours as needed for Pain.   ??? inhalational spacing device by Misc.(Non-Drug; Combo Route) route. AEROCHAMBER PLUS.  Use with MDI    ??? LORazepam 15 min before MRI, can be repeated after 15 min.   ??? lovastatin Take 40 mg by mouth daily with dinner.   ??? metFORMIN Take 1 tablet (500 mg total) by mouth 2 times a day with meals.   ??? oxybutynin Take 1 tablet (10 mg total) by mouth daily. Indications: Urinary Urgency   ??? REBIF REBIDOSE Inject under the skin three times a week as directed by physician.     No current facility-administered medications for this visit.       Allergies: Compazine [prochlorperazine edisylate] and Prochlorperazine    History:      PMHx:  has a past medical history of Asthma; Diabetes mellitus (CMS Dx); Hypertension; Multiple sclerosis (CMS Dx); and Neuromuscular disorder (CMS Dx).     PSHx:  has a past surgical history that includes Knee surgery; Appendectomy; and Hysterectomy.    Social Hx: reports that she has never smoked. She has never used smokeless tobacco. She reports that  she drinks alcohol. She reports that she does not use drugs. She lives by herself. She feels she is safe but has to take extra time. Feels depressed being lonely. Grandkids lives cross the street and come and visit her. She goes to [facility], do dance club and try to stay active.     ZOX:WRUEAV history includes Alcohol abuse in her father and other; Brain Cancer in her other; COPD in her father; Cancer in her father, maternal grandmother, mother, and sister; Diabetes in her daughter, paternal aunt, paternal grandfather, and paternal uncle; Skin Cancer in her other; Stomach Cancer in her other; Throat Cancer in her other.    Physical Exam:    There were no vitals taken for this visit.     General: NAD  Resp:     Clear Bilaterally, regular unlabored breaths  CV:     RRR  No Edema, no skin rashes    Mental Status:Patient registered 3 objects without difficulty and recalled 3/3 after five minutes.  The patient was fully oriented and had a normal fund of  knowledge.  Cranial Nerves: Color Vision12/12 OD and 10/12 OS, best corrected visual acuity was 20/20 OD and 20/20 OS.  There was no afferent pupillary deficit.  Funduscopic exam revealed normal optic nerve heads.   Horizontal and vertical eye movements were full.  There was no nystagmus.  Facial sensation was intact to pinprick.  There was no facial weakness.  Hearing was intact to finger rubbing bilaterally. Palate moved fully and symmetrically.  Tongue protruded in the midline.  There was no dysarthria.  Motor:   Strength:R-L (MRC Scale)   Deltoid  5 - 5   Hip Fx   4+ - 5 (R. Hip limited by pain)  Biceps   5 - 5   Knee Ex  5 - 5   Triceps  5 - 5   Knee Fx  5 - 5   Wrist Ex  5 - 5   Plantar Fx 5 - 5   Grip         5 - 5   Ankle dorsiflex 5 - 5  Tone was normal  Reflexes: R / L (MRC Scale)   Biceps  2 / 2   Quadriceps 2 / 2   Triceps  2 / 2   Achilles  2 / 2   Brachoradialis 2 / 2   Toes   Up / down   Coordination: There was no ataxia on finger to nose or heel to shin  testing.  Sensation: Decreased vibration BLE.     Position and pinprick sensation were intact in all four extremities.  Gait and station:  Unable to do tandem walk and hopping on either foot.   25 foot walk 02/27/2013 04/09/2014 10/01/2014 01/08/2015 07/15/2015 01/13/2016 07/06/2017   Did the patient wear an ankle foot orthosis (AFO)? No No No No No No No   Was an assistive device used? No No No No No No No   Trial No. 1: Completed Completed Completed Completed Completed - -   Time for 25 Foot Walk: 6.06 secs 7.46 secs 8.26 secs 5.40 secs 5.81 secs 4.96 secs 6.21 secs      Tests:      09/23/16   Cervical spine:  Unchanged minimal noncompressive disc bulges at C4-C5, and C5-C6 without spinal cord signal abnormalities or enhancement.   Fairly extensive signal abnormality seen in the pons which appears unchanged  Thoracic spine:  Limited evaluation of thoracic cord signal secondary to artifact, without definite cord signal abnormality.  Lumbar spine:  Multilevel degenerative changes of the lumbar spine as detailed, most significant at L2-L3 and L3-L4 as detailed. There is moderate bilateral L3 and right L4 foraminal stenosis.  Enhancement along the facet joints at L3-L4 may represent synovitis. At this level, there is minimal grade 1 anterolisthesis of L3 on L4.    09/04/15 compare to 04/08/14  Brain:  1. Stable moderate plaque burden within the periventricular and subcortical white matter compatible with the history of Melanie. No new or active lesions.  Cervical Spine:  1. No cord signal abnormality or enhancing lesions.  Thoracic spine:  1.?? No definite cord signal abnormality or enhancing lesions. Motion degraded examination limits cord evaluation.    04/08/14 COMPARISON: MRI dated 03/27/2012.  MRI head:   1. No significant interval change in the moderate burden of T2 signal prolongation involving the periventricular, deep, and central pontine white matter consistent with the clinical diagnosis of multiple sclerosis. No new lesions,  restricted diffusion, or enhancement is identified.   2. Small  amount of layering fluid signal in the right maxillary sinus.         MRI cervical spine:   1. No evidence of cord signal abnormalities or enhancement.   2. Very mild multilevel degenerative discogenic changes without evidence of central canal or neuroforaminal stenosis, unchanged.         MRI thoracic spine:   1. No evidence of cord signal abnormalities or enhancement.   2. Very mild degenerative discogenic changes without evidence of central canal or neuroforaminal stenosis, involving the T3-T4 through T7-T8 levels.      Lab Results   Component Value Date    WBC 9.0 02/25/2016    HGB 13.1 02/25/2016    HCT 40.1 02/25/2016    MCV 91.8 02/25/2016    PLT 219 02/25/2016     Lab Results   Component Value Date    CREATININE 0.67 08/31/2016    BUN 15 08/31/2016    NA 140 08/31/2016    K 4.1 08/31/2016    CL 101 08/31/2016    CO2 30 08/31/2016     Lab Results   Component Value Date    ALT 42 02/25/2016    AST 60 (H) 04/02/2014    ALKPHOS 47 02/25/2016    BILITOT 0.4 02/25/2016     Lab Results   Component Value Date    VITD25H 72.8 02/25/2016     Lab Results   Component Value Date    VITAMINB12 322 10/01/2014     Lab Results   Component Value Date    TSH 1.42 10/01/2014     Lab Results   Component Value Date    HGBA1C 8.7 (H) 03/30/2012      Assessment/Plan:       Melanie [name] is a 62 y.o. female with Multiple Sclerosis diagnosed in 2004 who has treated with Rebif eversince. Melanie is not active and not progressing. She feels depressed, she is not interested in pharmacological intervention but open to do counseling, I also mentioned IFN such as Rebif can be culprit but she is very nervous about possible side effects of other DMT and at this time not ready to make any change. I counseled her in length about role of healthy life style including diet, exercise and sleep in management of Melanie. She remain uninterested in symptom management such as rehabilitation or  consulting with urologist about her bladder symptoms.     1. Multiple sclerosis  - Continue Rebif  - RTC in 6 mo with Melanie [name]      2.  Lumbar Spondylosis with radiculopathy   - Amb referral to Physical Therapy  - Counseled about weight loss, posture, diet and exercise    3. Gait difficulty likely multifactorial Melanie + Spondylosis   - Amb referral to Physical Therapy  - Cane    4. Spasm  - Baclofen     5. Neuropathic pain  - Gabapentin    6. Vitamin D deficiency  - Vitamin D 25 hydroxy; Future  - continue supplement     7.Frequent UTI,  Urge incontinence of urine, nocturia concern about falls   - reordered Amb referral to Urology but patient remains unsure about this     8. Depression  - consider switching Rebif to another DMT  - Psychotherapy    Total time spent was 45 minutes. Greater than 50% of that time was spent face to face with patient counseling about Multiple Sclerosis disease management,  Rebif risks, benefits and monitoring recommendations, lifestyle modifications  to improve outcomes of Multiple Sclerosis, and symptom management as detailed above in the assessment and plan, as well as coordinating care.     Melanie Gathers, MD  Assistant Professor of Neurology   Director of [facility]   [address]   [place], [place] [address]????  [phone]

## 2018-04-04 NOTE — Unmapped (Addendum)
Continue Rebif  MRI of brain and spine   Depression, refer to psychotherapy and counseling, Rebif could be part of the problem too  Physical therapy   Return in 6 months with [facility]

## 2018-04-04 NOTE — Unmapped (Signed)
Agent relayed the late patient policy to the caller:     ???Since you will be arriving after your appointment time, it will be at the discretion of your provider on whether or not they can accommodate the time change. Would you like to go ahead and reschedule or continue to come to the appointment????

## 2018-04-04 NOTE — Unmapped (Deleted)
Portions of this note have been copied forward from the note written by me or DNP in past but reviewed in their entirety and are accurate to date. I have reviewed and updated the history, physical exam, data, assessment, and plan of the note so that it reflects the evaluation and management of the patient by me on 04/04/2018      Date Symptom   Onset/Outcome Brain MRI Spine MRI Tests DMT period A/E Meds   2000 Fatigue, cognitive G/P        2004 Blurry vision B/L G/I  Dx MS    Rebif IVMP   2013  Stable 04-now  PV -ve      03/2014  09/2014 S 3/15 R leg W after cold    4/15 S       12/2014  9/16 S       12/2015  02/2016     9/17 S      06/2017                  Onset: Acute/Gradual , Outcome: Resolved/Improved/Progressed, MRI: Peri Ventricular/Juxta Cortical,      HPI:      04/04/18:      07/06/17:  She feels about the same, denies new symptom or progression of old symptom, no recent fall, no recent UTI or other infection, report adherence to her treatment.      02/25/16 Dr. Rolm Baptise:   Ms Blue is here for evaluation of return of shooting pain in the right leg with difficulty walking and went to the Clinic where she was started on Antibiotics (Bactrim x 7 days) and given pain meds, Norco as well as Valium for spasms with improvement 3 days later.  After a week symptoms resolved and she returned to baseline until just yesterday when she started having the shooting pain  and right leg spasm again which affects her walking.  She is still on Rebif and takes it regularly with no missed doses.    01/13/16:  She denies any new symptoms or exacerbation except worsening of fatigue after stopping high dose vitamin D. Her family doctor recommended taking OTC but that does not help.     07/15/15 Sandy:  Ms Janyla is here for evaluation of worsening symptoms.  She reports that she has noticed that she has increased trouble walking when she gets up in the morning, she feels like she is a little of balance.  Once she has walked to the bathroom she  is back to baseline.  She has also noticed that she has some pain in her right leg from halfway down her thigh to her calf which occurs every now and then for about 1 month. This has been going on for about 3 weeks, she otherwise has no other new symptoms.    01/08/15:  She thinks overall is stable, denies any new symptom or exacerbation. She received a steroid shot for knee pain which helped.      10/01/14:  I had the pleasure of evaluating Ms Mcguinn who is here for evaluation of new symptoms.  She is on Rebif since 2004 and take is regulalry with no missed doses.  She developed new pain in her left knee and left leg, She also noticed increased numbness in that extremity which lasted about 3 days and it has started to improve.    04/09/14 Dr. Genelle Gather  On 4/2, she woke up and couldn't walk, felt her R leg was weak. Also lost sensation with  tingling. She waited to see if this would get better. She called on 4/6 and Dr. Genelle Gather thought she is probably having exacerbation. Labs were done with CBC, BMP, UA which did not show evidence of infection. Nurse visited her at home to get IV access. IV solumedrol was started on 4/12. She got the second dose today. Since about Friday last week, sensation was coming back. Weakness is improved but not back to baseline. She is able to walk. Dancing is still hard. She is still on Rebif, doing ok with the injection. This is a first real exacerbation since 2004.   Recent exacerbation happened after recent URI.    02/27/13  She was working as OR Haematologist at [employer]. She was feeling very tired with some cognitive and memory problem around 2000. Thne she had an episode of visual change in both eye in 2004 with partial recovery. She was Dx with MS based on MRI. She was started on Rebif . She thinks it works and tolerating it well however does not like injection. Her last MRI was from April 2013.   Last year she lost 25 Lbs by diet and exercise after she was diagnosed with Diabetes but she  regain that weight. She is planning to go back in diet this week.     I reviewed Dr. 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 Pt is currently on Rebif and is tolerating it well. She has not had an MRI since January 2012. Since pts last visit she feels her condition is getting worse. She is experiencing cognitive issues including concentration and memory issues. She has also started to urinate uncontrollably and has missed work as a result. Pt is having trouble walking due to pain in both knees and L hip pain. She also complains of stress at work which causes her to be depressed.   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 Pt is currently on Rebif and is tolerating it well. She has not had an MRI since January 2012. Since pts last visit she feels her condition is getting worse. She is experiencing cognitive issues including concentration and memory issues. She has also started to urinate uncontrollably and has missed work as a result. Pt is having trouble walking due to pain in both knees and L hip pain. She also complains of stress at work which causes her to be depressed.   Due to pts complaints of cognitive issues, we will refer her for a neuropsych eval. We will also give her the contact number to the social worker for the [facility]. I would like to obtain and MRI of the brain and c spine.    Review of System:      Bladder incontinence   Nocturia   Weakness:  BLE when she first gets up  Pain:     R. Knee and hip, Steroid shot and Gabapentin   Sensory  Numbness r. foot  Fatigue:  bothersome  Sleep:        4 to 5 hrs uninterrupted,   Sleep Apnea:  No longer uses CPAP    STM   Main symptom she get disability   Urinary Incontinece Oxybutinin helps, she has done pelvic exercise, this was one the reason she stopped working   Walking Difficulty Tired and back pain, can do more after  exercise   Imbalance  Trip over but no fall   Fatigue                        Exhausted    Life Style:      Living Situation: With daughters family  Diet:  Diet  Exercise: Line Dance as exercise 4 days a week   Vitamin D: 50 K weekly     Medications:      Current Outpatient Prescriptions   Medication Sig   ??? albuterol Inhale 2 puffs into the lungs. Every 4 to 6 hours as needed.    ??? baclofen Take 1 tablet (10 mg total) by mouth 3 times a day.   ??? blood sugar diagnostic Test blood sugars up to twice a day   ??? cholecalciferol (vitamin D3) Take 1 capsule (50,000 Units total) by mouth every 7 days.   ??? ibuprofen Take 1 tablet (800 mg total) by mouth every 8 hours as needed for Pain.   ??? inhalational spacing device by Misc.(Non-Drug; Combo Route) route. AEROCHAMBER PLUS.  Use with MDI    ??? LORazepam 15 min before MRI, can be repeated after 15 min.   ??? lovastatin Take 40 mg by mouth daily with dinner.   ??? metFORMIN Take 1 tablet (500 mg total) by mouth 2 times a day with meals.   ??? oxybutynin Take 1 tablet (10 mg total) by mouth daily. Indications: Urinary Urgency   ??? REBIF REBIDOSE Inject under the skin three times a week as directed by physician.     No current facility-administered medications for this visit.       Allergies: Compazine [prochlorperazine edisylate] and Prochlorperazine    History:      PMHx:  has a past medical history of Asthma; Diabetes mellitus (CMS Dx); Hypertension; Multiple sclerosis (CMS Dx); and Neuromuscular disorder (CMS Dx).     PSHx:  has a past surgical history that includes Knee surgery; Appendectomy; and Hysterectomy.    Social Hx: reports that she has never smoked. She has never used smokeless tobacco. She reports that she drinks alcohol. She reports that she does not use drugs. She lives by herself. She feels she is safe but has to take extra time. Feels depressed being lonely. Grandkids lives cross the street and come and visit her. She goes to [facility], do dance club and try to stay  active.     ZOX:WRUEAV history includes Alcohol abuse in her father and other; Brain Cancer in her other; COPD in her father; Cancer in her father, maternal grandmother, mother, and sister; Diabetes in her daughter, paternal aunt, paternal grandfather, and paternal uncle; Skin Cancer in her other; Stomach Cancer in her other; Throat Cancer in her other.    Physical Exam:    There were no vitals taken for this visit.     General: NAD  Resp:     Clear Bilaterally, regular unlabored breaths  CV:     RRR  No Edema, no skin rashes    Mental Status:Patient registered 3 objects without difficulty and recalled 3/3 after five minutes.  The patient was fully oriented and had a normal fund of knowledge.  Cranial Nerves: Color Vision12/12 OD and 10/12 OS, best corrected visual acuity was 20/20 OD and 20/20 OS.  There was no afferent pupillary deficit.  Funduscopic exam revealed normal optic nerve heads.   Horizontal and vertical eye movements were full.  There was no nystagmus.  Facial sensation  was intact to pinprick.  There was no facial weakness.  Hearing was intact to finger rubbing bilaterally. Palate moved fully and symmetrically.  Tongue protruded in the midline.  There was no dysarthria.  Motor:   Strength:R-L (MRC Scale)   Deltoid  5 - 5   Hip Fx   4+ - 5 (R. Hip limited by pain)  Biceps   5 - 5   Knee Ex  5 - 5   Triceps  5 - 5   Knee Fx  5 - 5   Wrist Ex  5 - 5   Plantar Fx 5 - 5   Grip         5 - 5   Ankle dorsiflex 5 - 5  Tone was normal  Reflexes: R / L (MRC Scale)   Biceps  2 / 2   Quadriceps 2 / 2   Triceps  2 / 2   Achilles  2 / 2   Brachoradialis 2 / 2   Toes   Up / down   Coordination: There was no ataxia on finger to nose or heel to shin testing.  Sensation: Decreased vibration BLE.     Position and pinprick sensation were intact in all four extremities.  Gait and station:  Unable to do tandem walk and hopping on either foot.   25 foot walk 02/27/2013 04/09/2014 10/01/2014 01/08/2015 07/15/2015 01/13/2016  07/06/2017   Did the patient wear an ankle foot orthosis (AFO)? No No No No No No No   Was an assistive device used? No No No No No No No   Trial No. 1: Completed Completed Completed Completed Completed - -   Time for 25 Foot Walk: 6.06 secs 7.46 secs 8.26 secs 5.40 secs 5.81 secs 4.96 secs 6.21 secs      Tests:      09/23/16   Cervical spine:  Unchanged minimal noncompressive disc bulges at C4-C5, and C5-C6 without spinal cord signal abnormalities or enhancement.   Fairly extensive signal abnormality seen in the pons which appears unchanged  Thoracic spine:  Limited evaluation of thoracic cord signal secondary to artifact, without definite cord signal abnormality.  Lumbar spine:  Multilevel degenerative changes of the lumbar spine as detailed, most significant at L2-L3 and L3-L4 as detailed. There is moderate bilateral L3 and right L4 foraminal stenosis.  Enhancement along the facet joints at L3-L4 may represent synovitis. At this level, there is minimal grade 1 anterolisthesis of L3 on L4.    09/04/15 compare to 04/08/14  Brain:  1. Stable moderate plaque burden within the periventricular and subcortical white matter compatible with the history of MS. No new or active lesions.  Cervical Spine:  1. No cord signal abnormality or enhancing lesions.  Thoracic spine:  1.?? No definite cord signal abnormality or enhancing lesions. Motion degraded examination limits cord evaluation.    04/08/14 COMPARISON: MRI dated 03/27/2012.  MRI head:   1. No significant interval change in the moderate burden of T2 signal prolongation involving the periventricular, deep, and central pontine white matter consistent with the clinical diagnosis of multiple sclerosis. No new lesions, restricted diffusion, or enhancement is identified.   2. Small amount of layering fluid signal in the right maxillary sinus.         MRI cervical spine:   1. No evidence of cord signal abnormalities or enhancement.   2. Very mild multilevel degenerative discogenic  changes without evidence of central canal or neuroforaminal stenosis, unchanged.  MRI thoracic spine:   1. No evidence of cord signal abnormalities or enhancement.   2. Very mild degenerative discogenic changes without evidence of central canal or neuroforaminal stenosis, involving the T3-T4 through T7-T8 levels.      Lab Results   Component Value Date    WBC 9.0 02/25/2016    HGB 13.1 02/25/2016    HCT 40.1 02/25/2016    MCV 91.8 02/25/2016    PLT 219 02/25/2016     Lab Results   Component Value Date    CREATININE 0.67 08/31/2016    BUN 15 08/31/2016    NA 140 08/31/2016    K 4.1 08/31/2016    CL 101 08/31/2016    CO2 30 08/31/2016     Lab Results   Component Value Date    ALT 42 02/25/2016    AST 60 (H) 04/02/2014    ALKPHOS 47 02/25/2016    BILITOT 0.4 02/25/2016     Lab Results   Component Value Date    VITD25H 72.8 02/25/2016     Lab Results   Component Value Date    VITAMINB12 322 10/01/2014     Lab Results   Component Value Date    TSH 1.42 10/01/2014       Assessment/Plan:       Ms. Bently is a 62 y.o. female with Multiple Sclerosis diagnosed in 2004 who has treated only with Rebif. MS is not active and not progressing. She is very concern about side effects of other DMT and at this time I do not feel the need to make any change in her treatment however she needs to pay more attention to her health in general. I counseled her in length about role of healthy life style including diet, exercise and sleep in management of MS. She remain uninterested in symptom management such as rehabilitation or consulting with urologist about her bladder symptoms.     1. Multiple sclerosis  - Continue Rebif  - Differential; Standing  - CBC; Standing  - RTC in 1 year     2.  Lumbar Spondylosis with radiculopathy   - Amb referral to Physical Therapy  - Counseled about weight loss, posture, diet and exercise    3. Gait difficulty likely multifactorial MS + Spondylosis   - Amb referral to Physical Therapy  - Cane    4.  Spasm  - Baclofen     5. Neuropathic pain  - Gabapentin    6. Vitamin D deficiency  - Vitamin D 25 hydroxy; Future  - continue supplement     7.Frequent UTI,  Urge incontinence of urine, nocturia concern about falls   - reordered Amb referral to Urology but patient remains unsure about this     Total time spent with patient was 25 minutes. Greater than 50% of that time was spent counseling about Multiple Sclerosis disease management,  Rebif, and symptom management as detailed above in the assessment and plan, as well as coordinating care.      Raeanne Gathers, MD  Assistant Professor of Neurology   Director of [facility]   [address]   [place], [place] [address]????  [phone]

## 2018-04-05 NOTE — Telephone Encounter (Signed)
Aquatic therapy order placed.

## 2018-04-05 NOTE — Telephone Encounter (Signed)
Aquatic therapy referral faxed to [phone] and emailed to [email or web address].

## 2018-04-06 NOTE — Telephone Encounter (Signed)
Pt asks if the Aquatic therapy referral was sent.   Given message below.

## 2018-04-12 NOTE — Telephone Encounter (Signed)
error 

## 2018-04-20 ENCOUNTER — Encounter
Admit: 2018-04-20 | Discharge: 2018-04-20 | Payer: Medicare (Managed Care) | Attending: Rehabilitative and Restorative Service Providers"

## 2018-04-20 DIAGNOSIS — G35 Multiple sclerosis: Secondary | ICD-10-CM

## 2018-04-20 MED ORDER — OXYBUTYNIN CHLORIDE ER 10 MG PO TB24
10 MG | ORAL_TABLET | Freq: Every evening | ORAL | 5 refills | Status: DC
Start: 2018-04-20 — End: 2018-08-25

## 2018-04-20 NOTE — Progress Notes (Signed)
PT DAILY TREATMENT NOTE    Diagnosis:   1. Pain in both lower extremities     2. Radiculopathy of lumbosacral region             Referring Provider:Zabeti, Aram, MD    Insurance plan:   Payor/Plan Subscr DOB Sex Relation Sub. Ins. ID Effective Group Num   1. Melanie Graven MANAGE* [name], Melanie [name] 1956/10/23 Female  [id number] 02/24/18 [id number]                                   [address] [address]                         # of visits per insurance authorization: BOMN with $40 co-pay  # of visits per POC: up to 8    Date of Initial Eval: 04/20/18    Date of last POC:04/20/18    Goals of Therapy:     Short Term Goals: time frame 4 wks status   Pt will demonstrate/show compliance with a home exercise program based on therapy recommendations to maintain benefits gained in physical therapy.   Pt without updated HEP    To include   - B hip/trunk stretching (HS, piriformis. Lateral flexors);  - STM options  - proper sitting/posture/body mechanics     Pt will have only 1-2 episodes of LBP in a day episodes of pain come and go thru out the day   Pt will report a reduction in pain,  </= to 4/10 Pain ranges from 6-7/10 to 10/10    Pt will be able to laterally flex 50% ROM R/L without pain R/L lateral trunk flex results in instant pain/discomfort/muscle tension   ______________________________________________________________________  Pain:   /10         Location:                             Precautions: Multiple Sclerosis, DON'T stand on soft foam (creates shooting pain in BLEs)  Visual impairment with Reading    Activity Date: 04/20/18 Date:  Date: Date:    Visit # 1      Checked for POC Certification         Verified Signed POC:    YES[]       NO[]            Date:    Rerouted to Referral Source for signature via: EMR[x]   Fax[]    Mail[]       Date:  Routed to PCP (if other than referring) for signature via: EMR[]   Fax[]     Mail[]      Date:  Was POC updated?        YES[]         NO[]                            Date:        If yes, Verified  Signed POC:      YES[]             NO[]            Date:      CPT code  Daily treatment record Timed Coded Treatment Minutes   PT Moderate Complexity Eval: 47829 Eval completed 42   Therapeutic Exercise: 97110     Therapeutic Activities: 97530  Manual Therapy: 97140          Response to Treatment      Total Treatment TIme:  42     Plan:   (1)  HEP w/LARGE PRINT;       - B hip/trunk stretches (B hip/trunk stretching [HS, piriformis, lateral flexors];      - STM options      - proper sitting/posture/body mechanics   (2) reduce radicular sx    Additional Information:     Patient Education:

## 2018-04-20 NOTE — Unmapped (Signed)
Physical Therapy Initial Evaluation and Plan of Care    Name: Melanie [name]     Date of Birth: 13-Apr-1956      MRN: [id number]    Date of evaluation: 04/20/2018  Referring Physician: Raeanne Gathers, MD  [address]  Neurology  [place], [place] [address]    Specific Order: eval and treat  Date of Onset: 04/04/18  Primary Medical Diagnosis:   1. Pain in both lower extremities     2. Radiculopathy of lumbosacral region       Insurance: Payor: HUMANA MANAGED MEDICARE / Plan: HUMANA GOLD PLUS MEDICARE / Product Type: Medicare Mngd Care /    - Eval only    []  Yes   [x]  No    NO AQUATICS COVERED  04/19/2018//[facility] IV/[phone]/BENEFIT AUTH INFO  PT OT SP- PT. HAS  HUMANA MEDICARE GOLD PLUS HMO   INS PLAN  EFF DATE   3.1.19  DRAKE IS IN NETWORK   V ARE BOMN FOR ALL 3 SERVICES  AQUATIC CODE 47829 IS NOT A COVERED SERVICE-PT WAS SUPP  TO DO AQT BUT IT IS NOT COV SO SHE WILL DO LAND.  MODALITY LIMIT?   NONE  COPAY-$40 COPAY PER DAY      CO-INS- 100%   DED-$0           OOP-$3400  MET-$195.08  NO PRE-EXISTING  NO YRLY OR LIFETIME MAX   PER     RAY       REF # [id number]  PH #  [phone],     Subjective/History:   History of Current Problem/Reason for Referral: Melanie [name] is a 62 y.o. female who presents today with chief complaint / concerns regarding:   Back pain x few weeks; insidious onset, slowly progressing  Bending over and stooping worsens the pain; walking requires occasional stretching  If gets too bad (constant pain 7-8/10), takes Ibuprofen which helps x week. If a 10/10 takes a mm relaxor (has done once in the past)     PMHx:   Past Medical History:   Diagnosis Date   ??? Asthma    ??? Diabetes mellitus (CMS Dx)    ??? Hypertension    ??? Multiple sclerosis (CMS Dx)     Dx 2000   ??? Neuromuscular disorder (CMS Dx)     MS - dx 6 yrs ago      Medications: She has a current medication list which includes the following prescription(s): albuterol, baclofen, benzonatate, blood sugar diagnostic, cholecalciferol (vitamin d3),  famotidine, ibuprofen, inhalational spacing device, lorazepam, lovastatin, metformin, oxybutynin, and rebif rebidose.  She is allergic to compazine [prochlorperazine edisylate] and prochlorperazine.  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 Can do housework but not all in one or two days as she used to  Precautions: MS, shooting pain when standing on soft/foam surface   Patient Goal for Therapy: feel better to return to prior scheduled activities   Additional Comments: reports pain in both legs     Objective:         Observation:  Posture: sitting unsupported: lateral trunk shift to R; pelvic obliquity on L;     Pt obese with very large breasts and smaller lower half of body   Dowager's Hump with thoracic kyphosis and reduced lumbar lordosis   Mental Status:  no deficits evident    Edema: mild, B LEs    Vision/hearing: impaired vision for reading - requires special glasses     Pain:   Level (0-10): 6-7/10 at present - across low back; episodes of pain come and go thru out the day  B UE   Strength: WFL   B UE Range of Motion: WFL   B LE   Strength: WFL   B LE Range of Motion: WFL EXCEPT:  (degrees)   R SKTC with pain in R post-lat hip/over R PSIS  L SKTC with tightness in post thigh region/hip  Hook lying - R ankle on L knee with instant pain in R post-lat hip/over R PSIS  Hook lying - L ankle on R knee with significant tightness in buttock/low back  Supine - R SLR 0-30 deg. with significant pain in R post-lat hip/over R PSIS  Supine - L  SLR 0-45 deg with significant tightness/some pain in L  buttocks/low back    Sitting: lat flex R/L result in pain, unable to reach mat with forearm. To L feels tightness on R lower back/buttocks; To R, feels pain 50% of the distance to the mat    Balance:   - Sitting WFL  - Standing WFL  Coordination:  no dysmetria or tremor noted  Sensation:  impaired - due to MS     Specialized Test:  Oswestry LBP Disability score 47% ( 9 categories)    Learning Assessment:  Patient is able to communicate with therapist and verbalize understanding of directions/instructions     [x]  Yes   []  No  Patient is unable to communicate with therapist and/or verbalize understanding of  directions/understanding due to the following barriers to learning:  [x]  N/A  []  Reading   []  Language   []  Visual   []  Hearing   []  Other:  Is an interpreter required? []  Yes  [x]  No    Assessment:   Assessment/Problem List: Patient presents with chronic LBP with BLE radicular pain contributing to functional limitations including chronic pain in both LEs. Patient would benefit from skilled PT to address the aforementioned deficits.     Patient/Family received education on the purpose of therapy, participated in the development of the POC and verbalized understanding and agreement of POC, goals    Rehabilitation Potential: Based on this therapist's assessment, Melanie [name] has the following rehabilitation potential for the PT goals stated below: : fair - pt's MS may contribute to limitations with exercise    Plan Of Care:     Patient/Family Goals: By discharge, in agreement with patient:  Goals of Therapy:     Short Term Goals: time frame 4 wks status   Pt will demonstrate/show compliance with a home exercise program based on therapy recommendations to maintain benefits gained in physical therapy.   Pt without updated HEP    To include   - B hip/trunk stretching (HS, piriformis. Lateral flexors);  - STM options  - proper sitting/posture/body mechanics     Pt will have only 1-2 episodes of LBP in a day episodes of  pain come and go thru out the day   Pt will report a reduction in pain,  </= to 4/10 Pain ranges  from 6-7/10 to 10/10    Pt will be able to laterally flex 50% ROM R/L without pain R/L lateral trunk flex results in instant pain/discomfort/muscle tension       Plan:    Treatment to Include: Therapeutic Exercise: 97110, Therapeutic Activities: 97530, Manual Therapy: 97140 and PT Moderate Complexity Eval: 47829     Patient/Family Agree to Treatment: Yes      Treatment Diagnosis (ICD-9 Code):   1. Pain in both lower extremities     2. Radiculopathy of lumbosacral region           Therapy Frequency/Duration: Patient to receive skilled PT services up to 2 times per week for up to 4 weeks or up to 8 visits starting from the first scheduled follow up visit within this plan of care.      Certification Period: 04/20/2018 - 60 days    Therapist Signature: Marlon Pel, PT  Date: 04/20/2018    Physician Certification   I certify that the above patient is under my care an requires the above services. These professional services are to be provided from an established plan, related to the diagnosis and reviewed by me every 90 days.     Additional comments/revisions:     Physician Name: Raeanne Gathers, MD  Signature_______________________________________ Date: _______      Evaluation Code Matrix - ALL INSURANCE CARRIERS  History:  Number of personal factors and comorbidities (includes relevant medical complications, complicating behaviors/beliefs, communication issues, mentation, etc):   Past Medical History:   Diagnosis Date   ??? Asthma    ??? Diabetes mellitus (CMS Dx)    ??? Hypertension    ??? Multiple sclerosis (CMS Dx)     Dx 2000   ??? Neuromuscular disorder (CMS Dx)     MS - dx 6 yrs ago      [] 0       97161    [] 1-2 97162               [x] >=3 [phone]   Examination Elements  Elements include body structures/systems and function including psychological activtiy limitation, and or participation limitations.    [x] 1-2 elements 97161             [] 3 elements 97162        [] =>4 elements 9716   Clinical Presentation  []  97161 Stable (unchanging, uncomplicated, predicted rate of recovery)    [x]  97162 Evolving (changing clinical characteristics (improving/regressing,)   []  97163 Unstable (unpredictable characteristics,(fluctuating pain, tone, function, BP response, etc)   Evaluation Code  []  Low Complexity 97161  [x]  Moderate Complexity 97162      []  High Complexity [phone]

## 2018-04-21 NOTE — Telephone Encounter (Signed)
Pt called to requests referral for PT.  She scheduled to have treatment for Monday 4/29.  Please fax to order to [facility].  Fax  # [phone].    /TNW/

## 2018-04-21 NOTE — Telephone Encounter (Signed)
PT order faxed to [facility] at [phone].  Pt was notified PT order was faxed.

## 2018-04-24 ENCOUNTER — Encounter: Payer: Medicare (Managed Care) | Attending: Rehabilitative and Restorative Service Providers"

## 2018-04-24 NOTE — Unmapped (Signed)
Please see note dated 04/20/18. Pt has decided the co-pay is too high and has chosen to go elsewhere    Physical Therapy Initial Evaluation - DISCHARGE SUMMARY  ??  Name: Melanie [name]     Date of Birth: 03/07/1956      MRN: [id number]                    Date of evaluation: 04/20/2018  Referring Physician: Raeanne Gathers, MD  [address]  Neurology  [place], [place] [address]       Specific Order: eval and treat  Date of Onset: 04/04/18  Primary Medical Diagnosis:   1. Pain in both lower extremities  ??   2. Radiculopathy of lumbosacral region  ??   ??  Insurance: Payor: [employer]ICARE / Plan: HUMANA GOLD PLUS MEDICARE / Product Type: Medicare Mngd Care /    - Eval only    []  Yes   [x]  No    NO AQUATICS COVERED  04/19/2018//[facility] IV/APS/[phone]/BENEFIT AUTH INFO  PT OT SP- PT. HAS ??HUMANA MEDICARE GOLD PLUS HMO ????INS PLAN  EFF DATE ????3.1.19  DRAKE IS IN NETWORK   V ARE BOMN FOR ALL 3 SERVICES  AQUATIC CODE 29528 IS NOT A COVERED SERVICE-PT WAS SUPP  TO DO AQT BUT IT IS NOT COV SO SHE WILL DO LAND.  MODALITY LIMIT? ????NONE  COPAY-$40 COPAY PER DAY ??????????CO-INS- 100%   DED-$0 ????????????????????OOP-$3400 ??MET-$195.08  NO PRE-EXISTING  NO YRLY OR LIFETIME MAX   PER ????????RAY ????????  REF # [id number]  PH # ??[phone],   ??  Subjective/History:   History of Current Problem/Reason for Referral: Melanie [name] is a 62 y.o. female who presents today with chief complaint / concerns regarding:   Back pain x few weeks; insidious onset, slowly progressing  Bending over and stooping worsens the pain; walking requires occasional stretching  If gets too bad (constant pain 7-8/10), takes Ibuprofen which helps x week. If a 10/10 takes a mm relaxor (has done once in the past)   ??  PMHx:        Past Medical History:   Diagnosis Date   ??? Asthma ??   ??? Diabetes mellitus (CMS Dx) ??   ??? Hypertension ??   ??? Multiple sclerosis (CMS Dx) ??   ?? Dx 2000   ??? Neuromuscular disorder (CMS Dx) ??   ?? MS - dx 6 yrs ago      Medications: She has a current  medication list which includes the following prescription(s): albuterol, baclofen, benzonatate, blood sugar diagnostic, cholecalciferol (vitamin d3), famotidine, ibuprofen, inhalational spacing device, lorazepam, lovastatin, metformin, oxybutynin, and rebif rebidose.  She is allergic to compazine [prochlorperazine edisylate] and prochlorperazine.  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 Can do housework but not all in one or two days as she used to  Precautions: MS, shooting pain when standing on soft/foam surface   Patient Goal for Therapy: feel better to return to prior scheduled  activities   Additional Comments: reports pain in both legs   ??  Objective:         Observation:               Posture: sitting unsupported: lateral trunk shift to R; pelvic obliquity on L;                Pt obese with very large breasts and smaller lower half of body              Dowager's Hump with thoracic kyphosis and reduced lumbar lordosis              Mental Status:  no deficits evident               Edema: mild, B LEs  ??  Vision/hearing: impaired vision for reading - requires special glasses                Pain:   Level (0-10): 6-7/10 at present - across low back; episodes of pain come and go thru out the day  B UE   Strength: WFL   B UE Range of Motion: WFL   B LE   Strength: WFL   B LE Range of Motion: WFL EXCEPT:  (degrees)   R SKTC with pain in R post-lat hip/over R PSIS  L SKTC with tightness in post thigh region/hip  Hook lying - R ankle on L knee with instant pain in R post-lat hip/over R  PSIS  Hook lying - L ankle on R knee with significant tightness in buttock/low back  Supine - R SLR 0-30 deg. with significant pain in R post-lat hip/over R PSIS  Supine - L  SLR 0-45 deg with significant tightness/some pain in L buttocks/low back  ??  Sitting: lat flex R/L result in pain, unable to reach mat with forearm. To L feels tightness on R lower back/buttocks; To R, feels pain 50% of the distance to the mat  ??  Balance:   ?? Sitting WFL  ?? Standing WFL  Coordination:  no dysmetria or tremor noted  Sensation:  impaired - due to MS   ??  Specialized Test:  Oswestry LBP Disability score 47% ( 9 categories)  ??  Learning Assessment:  Patient is able to communicate with therapist and verbalize understanding of directions/instructions     [x]  Yes   []  No  Patient is unable to communicate with therapist and/or verbalize understanding of  directions/understanding due to the following barriers to learning:  [x]  N/A  []  Reading   []  Language   []  Visual   []  Hearing   []  Other:  Is an interpreter required? []  Yes  [x]  No  ??  Assessment:   Assessment/Problem List: Patient presents with chronic LBP with BLE radicular pain contributing to functional limitations including chronic pain in both LEs. Patient would benefit from skilled PT to address the aforementioned deficits.   ??  Patient/Family received education on the purpose of therapy, participated in the development of the POC and verbalized understanding and agreement of POC, goals  ??  Rehabilitation Potential: Based on this therapist's assessment, Melanie [name] has the following rehabilitation potential for the PT goals stated below: : fair - pt's MS may contribute to limitations with exercise  ??  Plan Of Care:   ??  Patient/Family Goals: By discharge, in agreement with patient:  Goals of Therapy:     Short Term Goals: time frame 4 wks status  Pt will demonstrate/show compliance with a home exercise program based on therapy recommendations to maintain benefits gained  in physical therapy.   Pt without updated HEP    To include   - B hip/trunk stretching (HS, piriformis. Lateral flexors);  - STM options  - proper sitting/posture/body mechanics     Pt will have only 1-2 episodes of LBP in a day episodes of pain come and go thru out the day   Pt will report a reduction in pain,  </= to 4/10 Pain ranges from 6-7/10 to 10/10    Pt will be able to laterally flex 50% ROM R/L without pain R/L lateral trunk flex results in instant pain/discomfort/muscle tension   ??  ??  Plan:               Treatment to Include: Therapeutic Exercise: 97110, Therapeutic Activities: 97530, Manual Therapy: 97140 and PT Moderate Complexity Eval: 16109  ??              Patient/Family Agree to Treatment: Yes      ??              Treatment Diagnosis (ICD-9 Code):   1. Pain in both lower extremities  ??   2. Radiculopathy of lumbosacral region  ??   ??                            Therapy Frequency/Duration: Patient to receive skilled PT services up to 2 times per week for up to 4 weeks or up to 8 visits starting from the first scheduled follow up visit within this plan of care.                            Certification Period: 04/20/2018 - 60 days  ??  Therapist Signature: Marlon Pel, PT                Date: 04/20/2018  ??  Physician Certification   I certify that the above patient is under my care an requires the above services. These professional services are to be provided from an established plan, related to the diagnosis and reviewed by me every 90 days.   ??  Additional comments/revisions:            ??  Physician Name: Raeanne Gathers, MD  Signature_______________________________________ Date: _______  ??  ??  Evaluation Code Matrix - ALL INSURANCE CARRIERS  History:  Number of personal factors and comorbidities (includes relevant medical complications, complicating behaviors/beliefs, communication issues, mentation, etc):        Past Medical History:   Diagnosis Date   ??? Asthma ??   ??? Diabetes mellitus (CMS Dx) ??   ???  Hypertension ??   ??? Multiple sclerosis (CMS Dx) ??   ?? Dx 2000   ??? Neuromuscular disorder (CMS Dx) ??   ?? MS - dx 6 yrs ago      [] 0           97161    [] 1-2    97162               [x] >=3       [id number]   Examination Elements  Elements include body structures/systems and function including psychological activtiy limitation, and or participation limitations.  ??  [x] 1-2 elements 97161            [] 3 elements 97162        [] =>  4 elements 9716   Clinical Presentation  []  97161 Stable (unchanging, uncomplicated, predicted rate of recovery)    [x]  97162 Evolving (changing clinical characteristics (improving/regressing,)   []  97163 Unstable (unpredictable characteristics,(fluctuating pain, tone, function, BP response, etc)   Evaluation Code  []  Low Complexity 97161  [x]  Moderate Complexity 97162      []  High Complexity 97163  ??   ??  ??  ??  ??

## 2018-04-26 NOTE — Telephone Encounter (Signed)
Pt  requests to let Dr Genelle Gather know that she scheduled therapy with another therapist.

## 2018-04-26 NOTE — Telephone Encounter (Signed)
Noted.

## 2018-04-27 MED ORDER — LORazepam (ATIVAN) 0.5 MG tablet
0.5 | ORAL_TABLET | ORAL | 0 refills | Status: AC
Start: 2018-04-27 — End: 2018-05-28

## 2018-04-27 NOTE — Telephone Encounter (Signed)
CLINICAL PHARMACY: ADHERENCE REVIEW    Identified care gap per [employer]: Metformin  1000 mg adherence  Per records, appears 30-day supply last filled 02/02/18    Per [facility]: [phone]:  Metformin 1000 mg last filled on 02/09/18 for a 30-day supply     Reached patient to review.    Barrier(s) identified: None; Per patient she received a 90-day supply from [facility] on [address] in February 2019 and is almost due to refill this medication.  Education provided.

## 2018-04-27 NOTE — Telephone Encounter (Signed)
CLINICAL PHARMACY CONSULT: MED RECONCILIATION/REVIEW ADDENDUM    For Pharmacy Admin Tracking Only    PHSO: Yes  Total # of Interventions Recommended: 1  - Maintenance Safety Lab Monitoring #: 1  - New Therapy Lab Monitoring #: 1  Recommended intervention potential cost savings: 0  Time Spent (min): 15    Donnetta Simpers, CphT   [facility]

## 2018-04-27 NOTE — Unmapped (Signed)
.  Pt requesting prescription refill.       MEDICATION / DOSE / FREQ:    1) LORazepam (ATIVAN) 0.5 MG tablet   15 min before MRI, can be repeated after 15 min.  2)   3)     PHARMACY: [facility].       PHONE NUMBER: ??[phone]    DATE OF LAST APPT: 04/04/18  DATE OF NEXT APPT: 10/04/18

## 2018-04-27 NOTE — Telephone Encounter (Signed)
Ativan script called into pharmacy.

## 2018-05-01 ENCOUNTER — Inpatient Hospital Stay: Admit: 2018-05-01 | Discharge: 2018-05-01 | Payer: Medicare (Managed Care) | Attending: Neurology

## 2018-05-01 DIAGNOSIS — G35 Multiple sclerosis: Secondary | ICD-10-CM

## 2018-05-01 MED ORDER — gadobutrol (GADAVIST) 1 mmol/1 mL IV solution 10 mL
1 | Freq: Once | INTRAVENOUS | Status: AC | PRN
Start: 2018-05-01 — End: 2018-05-01
  Administered 2018-05-01: 19:00:00 10 mL/kg via INTRAVENOUS

## 2018-05-01 MED ORDER — pregabalin (LYRICA) 75 MG capsule
75 | ORAL_CAPSULE | Freq: Two times a day (BID) | ORAL | 5 refills | Status: AC
Start: 2018-05-01 — End: 2018-07-31

## 2018-05-01 MED FILL — GADAVIST 10 MMOL/10 ML (1 MMOL/ML) INTRAVENOUS SOLUTION: 10 10 mmol/10 mL (1 mmol/mL) | INTRAVENOUS | Qty: 10

## 2018-05-01 NOTE — Telephone Encounter (Signed)
Pt reports a lot of pain on her right side above the buttocks but below the waist.  Pt states her pain level is at a 12 on a scale of 1-10.   Pt states this started over the weekend.  Pt asks if she can be prescribed a muscle relaxer.  States she has taken ibuprofen for the pain.  States ibuprofen does not help with pain, but puts her to sleep.

## 2018-05-01 NOTE — Unmapped (Signed)
Addended byRaeanne Gathers on: 05/01/2018 01:44 PM     Modules accepted: Orders

## 2018-05-01 NOTE — Telephone Encounter (Signed)
Saturday night with pain. Starts in back, on the right side around sciatic nerve. Sharp pain that she rates a 15 on the pain scale of 10. Patient went to therapy this morning and brought pain down to a 10/10 after her therapy session. Patient is requesting a muscle relaxer or something for the pain. Patient does take baclofen 10 mg TID for spacticity. Ibuprofen didn't help any for her pain.

## 2018-05-01 NOTE — Telephone Encounter (Signed)
She was previously on Lyrica 70 mg bid but I do not see that in her medication list, if she is not on it will call in new Rx and if she is taking it will adjust the dose.

## 2018-05-01 NOTE — Progress Notes (Signed)
Spoke with patient about Dr. Genelle Gather prescription change of her Hulen Luster. New prescription sent to [facility]

## 2018-05-03 NOTE — Telephone Encounter (Signed)
Pt asks about the increase in her Lyrica.  States a Rx was never sent to the pharmacy.   Currently takes 75 mg, BID.

## 2018-05-10 MED ORDER — METFORMIN HCL 1000 MG PO TABS
1000 MG | ORAL_TABLET | ORAL | 3 refills | Status: AC
Start: 2018-05-10 — End: ?

## 2018-05-10 NOTE — Unmapped (Signed)
Page Spiro requesting call back on cholecalciferol, vitamin D3, 50,000 unit capsule  Patient wants to know why Rx was denied    Patient can be reached at [phone] (home)

## 2018-05-15 NOTE — Telephone Encounter (Signed)
Informed patient that an appeal had to be place. Waiting for Insurance responce.

## 2018-05-15 NOTE — Telephone Encounter (Signed)
Page Spiro states PA declined for D3, states she wants MD to fight this.  Patient states it can only be covered for only special circumstances , which patient feels she meets qualifications.  Patient states she was told Rx costs 2.98 out of pocket, patient willing to pay out of pocket if necessary for now.     Herbie Saxon requesting call back at [phone] (home)

## 2018-05-16 NOTE — Telephone Encounter (Signed)
Please see previously closed encounter on 5/20. Pt is calling to ask why script for Vitamin D was not sent to the pharmacy. The pt wants to pay out of pocket for this medication and states it only costs her $3. Pls send the script to the pharmacy for the patient today.

## 2018-05-17 ENCOUNTER — Encounter: Payer: Medicare (Managed Care) | Attending: Rehabilitative and Restorative Service Providers"

## 2018-05-17 MED ORDER — cholecalciferol, vitamin D3, 50,000 unit capsule
1250 | ORAL_CAPSULE | ORAL | 3 refills | Status: AC
Start: 2018-05-17 — End: 2019-03-22

## 2018-05-25 NOTE — Telephone Encounter (Signed)
Pt insurance called and said that pt made a complaint that dr Briant Cedar is not going to prescribe her pain medication and that they called the other day and wanted to know if there was a outcome yet on if dr Briant Cedar will prescribe her something.      Please advise [phone] opt 2  Ref # [id number]

## 2018-05-26 NOTE — Telephone Encounter (Signed)
???    There must be some mistake.  I do not treat her for any painful conditions nor has she presented to the office for a new problem that is painful since her last visit in February.    Can you call the insurance company that called [place] to find out more information please?

## 2018-05-26 NOTE — Telephone Encounter (Signed)
[employer] about grievance.  See scanned fax from [facility].   Provided information from chart, esp form phone interactions 03/24/18.  The specifics of her grievances are not supported by the chart and appear to be confused.  It references my prescribing of Lyrica (or agreeing to prescribe Lyrica) which is managed by her neurologist at [facility].  Unfortunately there are no specific dates to her complaints to allow more precise investigations.      There were some misunderstandings in March regarding med refill of maxzide which angered her greatly, but the mistake was resolved that day.  (I denied the maxzide because I mistakenly believed I had already refilled it earlier that day).    STAFF:  She is overdue for follow up of her diabetes, which was unexpectedly uncontrolled in [place].  If she is wishing to remain a patient here, she should schedule a f/u. If she is switchign to another provider, then place that provider's name as her PCP.  Thanks.      Lab Results   Component Value Date    LABA1C 8.2 02/08/2018    LABA1C 7.5 04/26/2017    LABA1C 6.7 10/24/2015

## 2018-05-26 NOTE — Telephone Encounter (Signed)
Please reach out to patient to help schedule appointment.

## 2018-05-26 NOTE — Telephone Encounter (Signed)
Fyi..

## 2018-05-26 NOTE — Telephone Encounter (Signed)
The ins company called to speak with dr Briant Cedarmattingly about the patients verbal complaint.  I then read what the dr wrote.  The rep from [employer] insisted to speak with the dr and I explained that he was with pts and that I was able to take any messages for him because he needed more information regarding the issue.  She was very insistent on speaking with the dr and I did let her know that any message I took would go directly back to him.  She then stated that she would fax over a copy of the verbal complaint for the dr to go over and then she wanted the dr to call her back .  I will scan the document in her chart and then send back in his bin    Please advise    Thanks

## 2018-05-27 NOTE — Telephone Encounter (Signed)
I called and spoke to the pt and the pt told me that she no longer sees Dr. Briant Cedar. I told the pt ok and I will pass the message along.    Thanks.

## 2018-06-05 MED ORDER — LOVASTATIN 40 MG PO TABS
40 MG | ORAL_TABLET | ORAL | 0 refills | Status: AC
Start: 2018-06-05 — End: ?

## 2018-06-05 NOTE — Telephone Encounter (Signed)
Due for follow up with Dr Briant CedarMattingly for diabetes.  Last A1c 8.2- please call them to schedule at their earliest convenience. (meds have been refilled this time)

## 2018-06-05 NOTE — Telephone Encounter (Signed)
Requesting refill

## 2018-06-06 ENCOUNTER — Ambulatory Visit: Payer: Medicare (Managed Care) | Attending: Clinical

## 2018-06-06 NOTE — Telephone Encounter (Signed)
Yes, please remove my name as her PCP.  Thanks.

## 2018-06-06 NOTE — Telephone Encounter (Signed)
PCP removed.

## 2018-06-06 NOTE — Telephone Encounter (Signed)
Called pt and she said she no longer is a pt with us she found another primary care doctor.  Pt would like no more calls

## 2018-06-06 NOTE — Telephone Encounter (Signed)
Would you like to be removed as her PCP?

## 2018-06-15 MED ORDER — baclofen (LIORESAL) 10 MG tablet
10 | ORAL_TABLET | ORAL | 5 refills | Status: AC
Start: 2018-06-15 — End: 2019-03-28

## 2018-07-28 NOTE — Telephone Encounter (Signed)
Spoke to [place] in regards to patient complaints and all 5 were addressed and proved to be not fact based. Patient has not been seen since March for an appointment and never addressing pain in a visit.

## 2018-07-31 MED ORDER — pregabalin (LYRICA) 150 MG capsule
150 | ORAL_CAPSULE | Freq: Two times a day (BID) | ORAL | 5 refills | Status: AC
Start: 2018-07-31 — End: 2019-01-27

## 2018-07-31 NOTE — Telephone Encounter (Signed)
Pt is calling to report that she is still having pain and is requesting heavier medication. Pt states she is having back pain, tingling and numbing in her legs. Pt states the sciatic nerve is an issue as well. Pt states the Ibuprofen is not working for her pain.

## 2018-07-31 NOTE — Telephone Encounter (Signed)
Pt called, Dr. Genelle Gather referred pt to Dr. Lovett Calender for pain & MS. Please call pt to schedule. Thank you! Thurston Hole [phone]

## 2018-07-31 NOTE — Telephone Encounter (Addendum)
Left message for pt to call office.  Pt calls back please relay Dr.Zabeti message.

## 2018-07-31 NOTE — Telephone Encounter (Signed)
Spoke with pt relayed Dr.Zabeti message.

## 2018-07-31 NOTE — Telephone Encounter (Signed)
Spoke with pt relayed Dr.Zabeti message.  Pt stated she doesn't want to increase her Lyrica.  Pt states it makes her to loopy.  Pt stated she wants something stronger like percocet.  I advised pt I don't think Dr.Zabeti will prescribe that but I'll send a message and call pt back.

## 2018-07-31 NOTE — Telephone Encounter (Signed)
I am not prescribing narcotic and I do not think Dr. Alan Mulder does it neither. There are many other pain management Dr. Lovett Calender can help [place] with but if patient insist to get narcotic she needs to see pain specialist.

## 2018-07-31 NOTE — Telephone Encounter (Signed)
Will increase Lyrica to 150 mf bid and refer her to Dr. Lovett Calender for pain management.

## 2018-08-01 NOTE — Unmapped (Signed)
Please see previously closed encounter on 8/5. Pt is calling back and asks what increasing the dosage of Lyrica would do for her regarding the pain she is experiencing?

## 2018-08-01 NOTE — Telephone Encounter (Signed)
Spoke with patient and schedule appointment with Dr. Tiburcio Pea for 9.25.19/  Patient verbalized understanding.  Call complete.

## 2018-08-01 NOTE — Telephone Encounter (Signed)
Spoke with pt advised her the lyrica will help with her pain.  Pt stated she wants to try the new script.  Advised pt script was called into pharmacy.

## 2018-08-02 NOTE — Telephone Encounter (Signed)
Noted.

## 2018-08-02 NOTE — Telephone Encounter (Signed)
Pt states to look out for her disability paper work from [employer].  FYI.  States all of her information is the same.

## 2018-08-09 NOTE — Telephone Encounter (Signed)
Received message from the call center to schedule appointment with Dr Lovett Calender.  Called patient, left message for her to call back at [phone], option 1.

## 2018-08-09 NOTE — Telephone Encounter (Signed)
Spoke to [place] who will call the patient. [facility] 08/09/18

## 2018-08-18 NOTE — Telephone Encounter (Signed)
Disability paperwork completed and faxed to [facility] 08/08/18, fax# [phone].

## 2018-08-25 MED ORDER — OXYBUTYNIN CHLORIDE ER 10 MG PO TB24
10 MG | ORAL_TABLET | Freq: Every evening | ORAL | 5 refills | Status: AC
Start: 2018-08-25 — End: ?

## 2018-08-30 MED ORDER — PIOGLITAZONE HCL 15 MG PO TABS
15 MG | ORAL_TABLET | ORAL | 0 refills | Status: AC
Start: 2018-08-30 — End: ?

## 2018-08-30 NOTE — Telephone Encounter (Signed)
Due for follow up with Dr Mackenze Grandison- please call them to schedule at their earliest convenience. (meds have been refilled this time)

## 2018-08-31 NOTE — Telephone Encounter (Signed)
Called Danielle Miles and she states that dr Briant Cedar is no longer her provider     Please advise     Thanks

## 2018-09-13 NOTE — Telephone Encounter (Signed)
Pt is calling to request an order for a shower chair to be sent to [facility]. Pt states she just got out of the hospital and needs this right away.         Fax [phone]  Ph [phone]

## 2018-09-15 NOTE — Telephone Encounter (Signed)
Shower chair ordered.

## 2018-09-15 NOTE — Telephone Encounter (Signed)
Shower chair order will be faxed on [phone].

## 2018-09-20 ENCOUNTER — Ambulatory Visit: Admit: 2018-09-20 | Discharge: 2018-11-03 | Payer: Medicare (Managed Care)

## 2018-09-20 DIAGNOSIS — M5417 Radiculopathy, lumbosacral region: Secondary | ICD-10-CM

## 2018-09-20 NOTE — Unmapped (Signed)
[facility]  [address]  [address]  [place], [place] [address]  [phone]

## 2018-09-20 NOTE — Unmapped (Signed)
CC: low back pain with radicular sxs  Referring Provider: Genelle Gather    HPI:    Melanie [name] is a 62 y.o. female who presents for evaluation of low back pain. Pt presented on 09/02/18 to [facility] ED for severe low back pain with radicular symptoms down posterior leg in S1 distribution. Felt having weakness in left leg as well 2/2 pain, unable to lift into bed. Left arm as well felt weak. MRI Brain and Head Angio unremarkable, ruling out new CVA. Neurology did not feel MS flair was etiology. Performed epidural injection under fluoroscopy at unknown level to pt. Chart review reveals L3 and L4 selective epidural injection. Pt's symptoms in the leg continued to get better and are non-existent today. When pain subsided  the weakness got better. Low back pain today is in band-like distribution and is more described as a pressure; no radicular sxs. Pt had Physical Therapy 2-3x per week for 7-8 weeks at [facility]. On Baclofen 10 mg TID for muscle spasms and Lyrica 100 mg BID for nerve pain.    Social History: lives alone, currently receiving home health care s/p discharge from [facility]    Past Medical History:   Diagnosis Date   ??? Asthma    ??? Diabetes mellitus (CMS Dx)    ??? Hypertension    ??? Multiple sclerosis (CMS Dx)     Dx 2000   ??? Neuromuscular disorder (CMS Dx)     MS - dx 6 yrs ago       Medication  Current Outpatient Prescriptions   Medication Sig Dispense Refill   ??? albuterol (PROVENTIL HFA;VENTOLIN HFA) 90 mcg/actuation inhaler Inhale 2 puffs into the lungs. Every 4 to 6 hours as needed.      ??? baclofen (LIORESAL) 10 MG tablet TAKE ONE TABLET BY MOUTH THREE TIMES A DAY 90 tablet 5   ??? blood sugar diagnostic Strp Test blood sugars up to twice a day 50 strip 6   ??? cholecalciferol, vitamin D3, 50,000 unit capsule Take 1 capsule (50,000 Units total) by mouth every 7 days. 12 capsule 3   ??? ibuprofen (ADVIL,MOTRIN) 800 MG tablet Take 1 tablet (800 mg total) by mouth every 8 hours as needed for Pain. 90 tablet 0   ???  inhalational spacing device (AEROCHAMBER) Spcr by Misc.(Non-Drug; Combo Route) route. AEROCHAMBER PLUS.  Use with MDI      ??? lovastatin (MEVACOR) 40 MG tablet Take 40 mg by mouth daily with dinner.     ??? metFORMIN (GLUCOPHAGE) 500 MG tablet Take 1 tablet (500 mg total) by mouth 2 times a day with meals. 60 tablet 3   ??? oxybutynin (DITROPAN-XL) 10 MG 24 hr tablet Take 1 tablet (10 mg total) by mouth daily. Indications: Urinary Urgency 30 tablet 0   ??? pioglitazone-metformin (ACTOPLUS MET) 15-850 mg per tablet Take 1 tablet by mouth daily.     ??? pregabalin (LYRICA) 100 MG capsule Take 100 mg by mouth 2 times a day. Indications: Take one capsule by mouth twice a day     ??? REBIF REBIDOSE 44 mcg/0.5 mL PnIj Inject under the skin three times a week as directed by physician. 36 Syringe 3   ??? triamterene-hydrochlorothiazide (MAXZIDE-25) 37.5-25 mg tablet Take 1 tablet by mouth daily.     ??? benzonatate (TESSALON) 100 MG capsule Take by mouth.     ??? famotidine (PEPCID) 20 MG tablet Take by mouth.     ??? pregabalin (LYRICA) 150 MG capsule Take  1 capsule (150 mg total) by mouth 2 times a day. 60 capsule 5     No current facility-administered medications for this visit.        Allergies  Allergies   Allergen Reactions   ??? Compazine [Prochlorperazine Edisylate]    ??? Prochlorperazine Other (See Comments)     Muscle spasm       Review of Systems:  The patient's medical history and review of systems was completed as part of her intake paperwork.  These forms were reviewed with the patient during the visit today, and have been scanned into electronic medical record.  Pertinent items are noted in HPI.    Physical Examination:    General: Well appearing female, in no acute distress  Vitals:    09/20/18 1458   BP: 138/88   BP Location: Left arm   Patient Position: Sitting   BP Cuff Size: Large   Pulse: 87   SpO2: 95%   Weight: (!) 239 lb 12.8 oz (108.8 kg)   Height: 5' 4 (1.626 m)   Respiratory: Normal respiratory  effort  Cardiovascular: No visual or palpable edema  Skin: no identified rashes, no induration, erythema or cyanosis  Neurologic: Light touch sensation is impaired at L1-L3 on the left , no allodynia or hyperalgesia, no hyperreflexia at L3/4  Extremities: No evidence of clubbing, cyanosis, tenosynovitis or nail pitting  MSK:  Directed towards low back  Inspection: morbid obese female, no gross asymmetry appreciated  Palpation: mild TTP along L2-L4 SP and overtop facets, severe TTP along greater trochanters lateral facets b/l  ROM: full active ROM of spine without pain  Stability: no instability appreciated in spine  Strength: 5/5 in HF, KE, PF, DF bilaterally  Special Tests:   -hip abduction against gravity very difficult to pt    Radiology:  MRI L spine at OSH dated 09/03/18:  Impression:  Multilevel degenerative changes of the lumbar spine as detailed, most significant at L2-L3 and L3-L4 as detailed. There is moderate bilateral L3 and right L4 foraminal stenosis.  Enhancement along the facet joints at L3-L4 may represent synovitis. At this level, there is minimal grade 1 anterolisthesis of L3 on L4.      L3 nerve root impingement on the R > L      L3 nerve root impingement in the foramen    Assessment: Melanie [name] is a 62 y.o. female presenting for initial evaluation for low back pain. Pt recently had L3 and L4 selective epidural steroids at OSH after severe radicular symptoms requiring hospitalization. Pt states today is pain free today. No weakness appreciated on motor exam, but sensory findings at L1-3 on the left present. Given hx of prior back pain, Multiple Sclerosis, and recent deconditioning, it is appropriate to get pt back into intensive outpatient PT while pain free.    1. Lumbosacral radiculopathy  Physical Therapy   2. Radiculopathy of lumbosacral region       Treatment Plan:  1. External Physical Therapy referral, pt previously had insurance accept [facility]  2. Continue Lyrica as  prescribed    Follow-up: PRN.  Sooner with any problems, questions, concerns, or worsening symptoms.      Lovey Newcomer  [facility] Resident

## 2018-10-04 ENCOUNTER — Ambulatory Visit: Admit: 2018-10-04 | Payer: Medicare (Managed Care) | Attending: Neurology

## 2018-10-04 ENCOUNTER — Encounter: Attending: Clinical

## 2018-10-04 DIAGNOSIS — G35 Multiple sclerosis: Secondary | ICD-10-CM

## 2018-10-04 NOTE — Unmapped (Signed)
Continue Rebif  Refer to PT/OT  Retrn in 6 months

## 2018-10-04 NOTE — Progress Notes (Signed)
SW met with pt following office visit with provider. Pt lives with daughter and 62 yo grandson. The daughter works 2-3 jobs so pt takes on a lot of the household responsibilities including cooking, driving grandson to/from school and activities. She at times gets resentful about this and feels doing too much/not enough for self.   SW provided support and counseling around self care.  This included questions prompting pt to think of ways to reduce responsibilities.  Pt expressed understanding and appreciation.  She was provided with SW contact info and will call as needed.     Jon Gills, LISW-S  [phone]

## 2018-10-04 NOTE — Unmapped (Signed)
Portions of this note have been copied forward from the note written by me or DNP in past but reviewed in their entirety and are accurate to date. I have reviewed and updated the history, physical exam, data, assessment, and plan of the note so that it reflects the evaluation and management of the patient by me on 10/04/2018      Date Symptom   Onset/Outcome Brain MRI Spine MRI Tests DMT period A/E Meds   2000 Fatigue, cognitive G/P        2004 Blurry vision B/L G/I  Dx MS    Rebif IVMP   2013  Stable 04-now  PV -ve      03/2014  09/2014 S 3/15 R leg W after cold    4/15 S       12/2014  9/16 S       12/2015  02/2016     9/17 S      06/2017         03/2018  09/2018  5/19 S 5/19                        Onset: Acute/Gradual , Outcome: Resolved/Improved/Progressed, MRI: Peri Ventricular/Juxta Cortical,      HPI:      10/04/18:  She reports MS symptoms are about the same, denies new symptom or progression of old symptom, no recent fall, no recent UTI or other infection, reports adherence to treatment.    She was scheduled to see spine surgeon, pain in back moving to left leg she went to [facility] , cardiac and MS issues ruled out, consult neurology thought she has nerve impingement she received steroid shot which was helpful. She started using walker and that is helping too.     04/04/18:  She feels about the same, denies new symptom or progression of old symptom, no recent fall, no recent UTI or other infection, reports adherence to treatment.    She has a cold, back issue is fine improved after lost some weight, MS is stable and not progressed.     07/06/17:  She feels about the same, denies new symptom or progression of old symptom, no recent fall, no recent UTI or other infection, report adherence to her treatment.      02/25/16 Dr. Rolm Baptise:   Ms Meyering is here for evaluation of return of shooting pain in the right leg with difficulty walking and went to the Clinic where she was started on Antibiotics (Bactrim x 7 days) and given  pain meds, Norco as well as Valium for spasms with improvement 3 days later.  After a week symptoms resolved and she returned to baseline until just yesterday when she started having the shooting pain  and right leg spasm again which affects her walking.  She is still on Rebif and takes it regularly with no missed doses.    01/13/16:  She denies any new symptoms or exacerbation except worsening of fatigue after stopping high dose vitamin D. Her family doctor recommended taking OTC but that does not help.     07/15/15 Sandy:  Ms Jamiria is here for evaluation of worsening symptoms.  She reports that she has noticed that she has increased trouble walking when she gets up in the morning, she feels like she is a little of balance.  Once she has walked to the bathroom she is back to baseline.  She has also noticed that she has some pain in her right  leg from halfway down her thigh to her calf which occurs every now and then for about 1 month. This has been going on for about 3 weeks, she otherwise has no other new symptoms.    01/08/15:  She thinks overall is stable, denies any new symptom or exacerbation. She received a steroid shot for knee pain which helped.      10/01/14:  I had the pleasure of evaluating Ms Girgis who is here for evaluation of new symptoms.  She is on Rebif since 2004 and take is regulalry with no missed doses.  She developed new pain in her left knee and left leg, She also noticed increased numbness in that extremity which lasted about 3 days and it has started to improve.    04/09/14 Dr. Genelle Gather  On 4/2, she woke up and couldn't walk, felt her R leg was weak. Also lost sensation with tingling. She waited to see if this would get better. She called on 4/6 and Dr. Genelle Gather thought she is probably having exacerbation. Labs were done with CBC, BMP, UA which did not show evidence of infection. Nurse visited her at home to get IV access. IV solumedrol was started on 4/12. She got the second dose today. Since about  Friday last week, sensation was coming back. Weakness is improved but not back to baseline. She is able to walk. Dancing is still hard. She is still on Rebif, doing ok with the injection. This is a first real exacerbation since 2004.   Recent exacerbation happened after recent URI.    02/27/13  She was working as OR Haematologist at [employer]. She was feeling very tired with some cognitive and memory problem around 2000. Thne she had an episode of visual change in both eye in 2004 with partial recovery. She was Dx with MS based on MRI. She was started on Rebif . She thinks it works and tolerating it well however does not like injection. Her last MRI was from April 2013.   Last year she lost 25 Lbs by diet and exercise after she was diagnosed with Diabetes but she regain that weight. She is planning to go back in diet this week.     I reviewed Dr. 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 Pt is currently on Rebif and is tolerating it well. She has not had an MRI since January 2012. Since pts last visit she feels her condition is getting worse. She is experiencing cognitive issues including concentration and memory issues. She has also started to urinate uncontrollably and has missed work as a result. Pt is having trouble walking due to pain in both knees and L hip pain. She also complains of stress at work which causes her to be depressed.   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 Pt is currently on Rebif and is tolerating it well. She has not had an MRI since January 2012. Since pts last visit she feels her condition is getting worse. She is experiencing cognitive issues including concentration and memory issues. She has also started to urinate uncontrollably and has missed work as a result. Pt is having trouble walking due to pain in both knees and L hip pain. She also  complains of stress at work which causes her to be depressed.   Due to pts complaints  of cognitive issues, we will refer her for a neuropsych eval. We will also give her the contact number to the social worker for the [facility]. I would like to obtain and MRI of the brain and c spine.    Review of System:      Depression  Because of safety concern live with her daughter which is stressful   Bladder incontinence   Nocturia   Weakness:  Walker since 2019  for long distance,   Pain:     R. Knee and hip, Steroid shot and Gabapentin   Sensory  Numbness r. foot  Fatigue:  bothersome  Sleep:        4 to 5 hrs uninterrupted,   Sleep Apnea:  No longer uses CPAP    STM   Main symptom she get disability   Urinary Incontinece Oxybutinin helps, she has done pelvic exercise, this was one the reason she stopped working   Walking Difficulty Tired and back pain, can do more after exercise   Imbalance  Trip over but no fall   Fatigue                        Exhausted    Life Style:      Living Situation: With daughters family  Diet:  Diet  Exercise: Line Dance as exercise 4 days a week   Vitamin D: 50 K weekly     Medications:      Current Outpatient Prescriptions   Medication Sig   ??? albuterol Inhale 2 puffs into the lungs. Every 4 to 6 hours as needed.    ??? baclofen TAKE ONE TABLET BY MOUTH THREE TIMES A DAY   ??? benzonatate Take by mouth.   ??? blood sugar diagnostic Test blood sugars up to twice a day   ??? cholecalciferol (vitamin D3) Take 1 capsule (50,000 Units total) by mouth every 7 days.   ??? famotidine Take by mouth.   ??? ibuprofen Take 1 tablet (800 mg total) by mouth every 8 hours as needed for Pain.   ??? inhalational spacing device by Misc.(Non-Drug; Combo Route) route. AEROCHAMBER PLUS.  Use with MDI    ??? lovastatin Take 40 mg by mouth daily with dinner.   ??? metFORMIN Take 1 tablet (500 mg total) by mouth 2 times a day with meals.   ??? oxybutynin Take 1 tablet (10 mg total) by mouth daily. Indications: Urinary Urgency   ???  pioglitazone-metformin Take 1 tablet by mouth daily.   ??? pregabalin Take 100 mg by mouth 2 times a day. Indications: Take one capsule by mouth twice a day   ??? pregabalin Take 1 capsule (150 mg total) by mouth 2 times a day.   ??? REBIF REBIDOSE Inject under the skin three times a week as directed by physician.   ??? triamterene-hydrochlorothiazide Take 1 tablet by mouth daily.     No current facility-administered medications for this visit.       Allergies: Compazine [prochlorperazine edisylate] and Prochlorperazine    History:      PMHx:  has a past medical history of Asthma; Diabetes mellitus (CMS Dx); Hypertension; Multiple sclerosis (CMS Dx); and Neuromuscular disorder (CMS Dx).     PSHx:  has a past surgical history that includes Knee surgery; Appendectomy; and Hysterectomy.    Social Hx: reports that she has never smoked. She has never used smokeless tobacco. She reports that she drinks alcohol. She reports that she does not  use drugs. She lives by herself. She feels she is safe but has to take extra time. Feels depressed being lonely. Grandkids lives cross the street and come and visit her. She goes to [facility], do dance club and try to stay active.     ZOX:WRUEAV history includes Alcohol abuse in her father and other; Brain Cancer in her other; COPD in her father; Cancer in her father, maternal grandmother, mother, and sister; Diabetes in her daughter, paternal aunt, paternal grandfather, and paternal uncle; Skin Cancer in her other; Stomach Cancer in her other; Throat Cancer in her other.    Physical Exam:    Ht 5' 4 (1.626 m)    Wt (!) 240 lb (108.9 kg)    BMI 41.20 kg/m??      General: NAD  Resp:     Clear Bilaterally, regular unlabored breaths  CV:     RRR  No Edema, no skin rashes    Mental Status:Patient registered 3 objects without difficulty and recalled 3/3 after five minutes.  The patient was fully oriented and had a normal fund of knowledge.  Cranial Nerves: Color Vision12/12 OD and 10/12 OS, best  corrected visual acuity was 20/20 OD and 20/20 OS.  There was no afferent pupillary deficit.  Funduscopic exam revealed normal optic nerve heads.   Horizontal and vertical eye movements were full.  There was no nystagmus.  Facial sensation was intact to pinprick.  There was no facial weakness.  Hearing was intact to finger rubbing bilaterally. Palate moved fully and symmetrically.  Tongue protruded in the midline.  There was no dysarthria.  Motor:   Strength:R-L (MRC Scale)   Deltoid  5 - 5   Hip Fx   4+ 4(R. Hip limited by pain)  Biceps   5 - 5   Knee Ex  5 - 4   Triceps  5 - 5   Knee Fx  5 - 4   Wrist Ex  5 - 5   Plantar Fx 5 - 4   Grip         5 - 5   Ankle dorsiflex 5 - 4  Tone was normal  Reflexes: R / L (MRC Scale)   Biceps  2 / 2   Quadriceps 2 / 2   Triceps  2 / 2   Achilles  2 / 2   Brachoradialis 2 / 2   Toes   Up / down   Coordination: There was no ataxia on finger to nose or heel to shin testing.  Sensation: Decreased vibration BLE.     Position and pinprick sensation were intact in all four extremities.  Gait and station:  LE weakness, uses walker for long distance  25 foot walk 02/27/2013 04/09/2014 10/01/2014 01/08/2015 07/15/2015 01/13/2016 07/06/2017   Did the patient wear an ankle foot orthosis (AFO)? No No No No No No No   Was an assistive device used? No No No No No No No   Trial No. 1: Completed Completed Completed Completed Completed - -   Time for 25 Foot Walk: 6.06 secs 7.46 secs 8.26 secs 5.40 secs 5.81 secs 4.96 secs 6.21 secs      Tests:      05/01/18  MRI brain:  Unchanged moderate MS plaque burden when compared to 09/04/2015. No evidence for active demyelination.  Suspect superimposed chronic small vessel ischemic changes.  MRI cervical spine:  Slightly limited by motion. No focal abnormal cord signal or enhancement.  Mild degenerative changes,  most pronounced at C4-C5 with facet arthropathy resulting in suspected bilateral neural foraminal narrowing.  MRI thoracic spine:  No definite abnormal  spinal cord signal or enhancement.  Mild epidural lipomatosis, unchanged.    09/23/16   Cervical spine:  Unchanged minimal noncompressive disc bulges at C4-C5, and C5-C6 without spinal cord signal abnormalities or enhancement.   Fairly extensive signal abnormality seen in the pons which appears unchanged  Thoracic spine:  Limited evaluation of thoracic cord signal secondary to artifact, without definite cord signal abnormality.  Lumbar spine:  Multilevel degenerative changes of the lumbar spine as detailed, most significant at L2-L3 and L3-L4 as detailed. There is moderate bilateral L3 and right L4 foraminal stenosis.  Enhancement along the facet joints at L3-L4 may represent synovitis. At this level, there is minimal grade 1 anterolisthesis of L3 on L4.    09/04/15 compare to 04/08/14  Brain:  1. Stable moderate plaque burden within the periventricular and subcortical white matter compatible with the history of MS. No new or active lesions.  Cervical Spine:  1. No cord signal abnormality or enhancing lesions.  Thoracic spine:  1.?? No definite cord signal abnormality or enhancing lesions. Motion degraded examination limits cord evaluation.    04/08/14 COMPARISON: MRI dated 03/27/2012.  MRI head:   1. No significant interval change in the moderate burden of T2 signal prolongation involving the periventricular, deep, and central pontine white matter consistent with the clinical diagnosis of multiple sclerosis. No new lesions, restricted diffusion, or enhancement is identified.   2. Small amount of layering fluid signal in the right maxillary sinus.         MRI cervical spine:   1. No evidence of cord signal abnormalities or enhancement.   2. Very mild multilevel degenerative discogenic changes without evidence of central canal or neuroforaminal stenosis, unchanged.         MRI thoracic spine:   1. No evidence of cord signal abnormalities or enhancement.   2. Very mild degenerative discogenic changes without evidence of  central canal or neuroforaminal stenosis, involving the T3-T4 through T7-T8 levels.      Lab Results   Component Value Date    WBC 9.5 04/04/2018    HGB 15.1 04/04/2018    HCT 44.8 04/04/2018    MCV 91.0 04/04/2018    PLT 220 04/04/2018     Lab Results   Component Value Date    CREATININE 0.97 04/04/2018    BUN 21 04/04/2018    NA 136 04/04/2018    K 4.7 04/04/2018    CL 95 (L) 04/04/2018    CO2 29 04/04/2018     Lab Results   Component Value Date    ALT 58 (H) 04/04/2018    AST 60 (H) 04/02/2014    ALKPHOS 50 04/04/2018    BILITOT 0.3 04/04/2018     Lab Results   Component Value Date    VITD25H 100.0 04/04/2018     Lab Results   Component Value Date    VITAMINB12 359 04/04/2018     Lab Results   Component Value Date    TSH 1.42 10/01/2014     Lab Results   Component Value Date    HGBA1C 7.3 (H) 04/04/2018      Assessment/Plan:       Ms. Bracken is a 62 y.o. female with Multiple Sclerosis diagnosed in 2004 who has treated with Rebif eversince. MS is not active and not progressing. She feels depressed mostly because of her  relation with her daughter whom she lives with. II am also concern Rebif can be culprit but she is very nervous switching her DMT and prefer to continue Rebit. I counseled her in length about role of healthy life style including diet, exercise and sleep in management of MS. She remain uninterested in symptom management such as rehabilitation or consulting with urologist about her bladder symptoms.     1. Multiple sclerosis  - Continue Rebif  - RTC in 6 mo with DNP    2.  Lumbar Spondylosis with radiculopathy , s/p ESI   - PT/OT   - Counseled about weight loss, posture, diet and exercise    3. Gait difficulty (multifactorial MS + Spondylosis)   - Amb referral to Physical Therapy  - Cane    4. Spasm  - Baclofen     5. Neuropathic pain  - Gabapentin    6. Vitamin D deficiency  - Vitamin D 25 hydroxy; Future  - continue supplement     7.Frequent UTI,  Urge incontinence of urine, nocturia concern about  falls   - reordered Amb referral to Urology but patient remains unsure about this     8. Depression, difficult relation with daughter  - consult with SW for counseling     Total time spent was 45 minutes. Greater than 50% of that time was spent face to face with patient counseling about Multiple Sclerosis disease management,  Rebif risks, benefits and monitoring recommendations, lifestyle modifications to improve outcomes of Multiple Sclerosis, and symptom management as detailed above in the assessment and plan, as well as coordinating care.     Raeanne Gathers, MD  Assistant Professor of Neurology   Director of [facility]   [address]   [place], [place] [address]????  [phone]

## 2018-10-12 ENCOUNTER — Encounter: Admit: 2018-10-12 | Discharge: 2018-10-12 | Payer: Medicare (Managed Care)

## 2018-10-12 DIAGNOSIS — R269 Unspecified abnormalities of gait and mobility: Secondary | ICD-10-CM

## 2018-10-12 NOTE — Unmapped (Signed)
Physical Therapy Initial Evaluation and Plan of Care    Name: Melanie [name]     Date of Birth: [birth year]      MRN: [id number]    Date of evaluation: 10/12/2018  Referring Physician: Raeanne Gathers, MD  [address]  Neurology  [place], [place] [address]    Specific Order: eval and treat  Date of Onset: 04/04/18  Primary Medical Diagnosis:   1. Multiple sclerosis (CMS Dx)     2. Impaired mobility and activities of daily living       Insurance: Payor: [employer] MANAGED MEDICARE / Plan: [employer] GOLD PLUS MEDICARE / Product Type: Medicare Mngd Care /    10/06/2018//TDC IV/[phone]/BENEFIT AUTH INFO  PT OT SP- PT. HAS  [employer] MEDICARE GOLD PLUS HMO INS PLAN  EFF DATE   3.1.19  DRAKE IS IN NETWORK   PT/OT/ST GETS UNLIMITED V BOMN PCY  NO PC REQ  MODALITY LIMIT? BOMN  DED: $0     MET: $   FAM $0   MET $  OOP: $3400    MET: $637.58   FAM $0   MET $  COPAY: $40 COPAY PER DAY,    CO-INS:    100%  DOES COPAY APPLY TO OOP?  YES  NO PRE-EXISTING  NO YRLY OR LIFETIME MAX   PER    JAMES         REF #  [id number]  PH #  [phone],   ??  [facility] IS A COVERED SERVICE,  IF MEDICARE PAYS PLAN WILL PAY,  THERAPIST MUST PROVE MEDICAL NECESSITY   IF PLAN DENIES PT WILL BE RESPONSIBLE      Subjective/History:   History of Current Problem/Reason for Referral: Melanie [name] is a 62 y.o. female who presents today with chief complaint / concerns regarding:   9/11 - back injection  Left hip and thigh pain x few weeks; slowly progressing  Wants to walk with out a limp - has been doing regular exercises  Walks with the walker in the community, walks with out the RW in the house - 9/11 started using RW - pt uses SC at times  Pt reports 5/10 pain, takes percocet  - only took total of 4.          PMHx:   Past Medical History:   Diagnosis Date   ??? Asthma    ??? Diabetes mellitus (CMS Dx)    ??? Hypertension    ??? Multiple sclerosis (CMS Dx)     Dx 2000   ??? Neuromuscular disorder (CMS Dx)     MS - dx 6 yrs ago      Medications: She has a current  medication list which includes the following prescription(s): albuterol, baclofen, benzonatate, blood sugar diagnostic, cholecalciferol (vitamin d3), empagliflozin, famotidine, ibuprofen, inhalational spacing device, lovastatin, metformin, oxybutynin, pioglitazone-metformin, pregabalin, pregabalin, rebif rebidose, and triamterene-hydrochlorothiazide.  She is allergic to compazine [prochlorperazine edisylate] and prochlorperazine.  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 Can do housework but not all in one or two days as she used to  Precautions: MS, pain in left thigh/leg  Patient Goal for Therapy: feel better to return to prior scheduled activities   Additional Comments: reports pain in both legs     Objective:         Observation:    Posture: sitting unsupported: lateral trunk shift to R; pelvic obliquity on L;     Pt obese with very large breasts and smaller lower half of body   Dowager's Hump with thoracic kyphosis and increased lumbar lordosis   Mental Status:  no deficits evident    Edema: mild, B LEs    Vision/hearing: impaired vision for reading - requires special glasses     Pain:   Level (0-10): 5/10 at present - across low back; episodes of pain come and go thru out the day  B UE   Strength: WFL   B UE Range of Motion: WFL   B LE   Strength: WFL with exception of Left hip abd and extension both 3+/5, ankle DF on the L 4/5  B LE Range of Motion: WFL EXCEPT:  (degrees)   R SKTC with pain in R post-lat hip/over R PSIS  L SKTC with  tightness in post thigh region/hip  TTP over pirifiromis bilat L more than R.       Balance:   - Sitting WFL  - Standing Staggered stand progressed to tandem stand inc difficulty with L LE behind R, SLS 5 seconds L, 8 sec R  Coordination:  no dysmetria or tremor noted  Sensation:  impaired - due to MS     Specialized Test:  Oswestry LBP Disability score TBD    Learning Assessment:  Patient is able to communicate with therapist and verbalize understanding of directions/instructions     [x]  Yes   []  No  Patient is unable to communicate with therapist and/or verbalize understanding of  directions/understanding due to the following barriers to learning:  [x]  N/A  []  Reading   []  Language   []  Visual   []  Hearing   []  Other:  Is an interpreter required? []  Yes  [x]  No    Assessment:   Assessment/Problem List: Patient presents with chronic LBP with BLE radicular pain L more than R contributing to functional limitations including chronic pain in both LEs and difficulty walking, esp with out AD. Patient would benefit from skilled PT to address the aforementioned deficits.     Patient/Family received education on the purpose of therapy, participated in the development of the POC and verbalized understanding and agreement of POC, goals    Rehabilitation Potential: Based on this therapist's assessment, KAMIYA ACORD has the following rehabilitation potential for the PT goals stated below: : fair - pt's MS may contribute to limitations with exercise    Plan Of Care:     Patient/Family Goals: By discharge, in agreement with patient:  Goals of Therapy:     Short Term Goals: time frame 4 wks status   Pt will demonstrate/show compliance with a home exercise program based on therapy recommendations to maintain benefits gained in physical therapy.   Pt without updated HEP    To include   - B hip/trunk stretching (HS, piriformis. Lateral flexors);  - STM options  - proper sitting/posture/body mechanics     Pt will have only 1-2  episodes of LBP in a day episodes of pain come and go thru out the  day   Pt will report a reduction in pain,  </= to 2/10 Pain ranges 4-5/10    Pt will improve oswestry score by 15% from baseline TBD   Pt to ambulate with out AD for 250 feet with minimal trendelenberg noted especially on the L and minimal pain Pt amb 20-40 feet at a time with no AD with increased left hip drop and decreased R foot clearance. Pt usually walks in community with RW.        Plan:    Treatment to Include: Therapeutic Exercise: 97110, Therapeutic Activities: 97530, Manual Therapy: 97140 and PT Moderate Complexity Eval: 54098     Patient/Family Agree to Treatment: Yes      Treatment Diagnosis (ICD-9 Code):   1. Multiple sclerosis (CMS Dx)     2. Impaired mobility and activities of daily living           Therapy Frequency/Duration: Patient to receive skilled PT services up to 2 times per week for up to 4 weeks or up to 8 visits starting from the first scheduled follow up visit within this plan of care.      Certification Period: 10/12/2018 - 60 days    Therapist Signature: Yevonne Pax, PT  Date: 10/12/2018    Physician Certification   I certify that the above patient is under my care an requires the above services. These professional services are to be provided from an established plan, related to the diagnosis and reviewed by me every 90 days.     Additional comments/revisions:     Physician Name: Raeanne Gathers, MD  Signature_______________________________________ Date: _______      Evaluation Code Matrix - ALL INSURANCE CARRIERS  History:  Number of personal factors and comorbidities (includes relevant medical complications, complicating behaviors/beliefs, communication issues, mentation, etc):   Past Medical History:   Diagnosis Date   ??? Asthma    ??? Diabetes mellitus (CMS Dx)    ??? Hypertension    ??? Multiple sclerosis (CMS Dx)     Dx 2000   ??? Neuromuscular disorder (CMS Dx)     MS - dx 6 yrs ago      [] 0       97161    [] 1-2 97162                [x] >=3 [id number]   Examination Elements  Elements include body structures/systems and function including psychological activtiy limitation, and or participation limitations.    [] 1-2 elements 97161            [x] 3 elements 97162        [] =>4 elements 9716   Clinical Presentation  []  97161 Stable (unchanging, uncomplicated, predicted rate of recovery)    [x]  97162 Evolving (changing clinical characteristics (improving/regressing,)   []  97163 Unstable (unpredictable characteristics,(fluctuating pain, tone, function, BP response, etc)   Evaluation Code  []  Low Complexity 97161  [x]  Moderate Complexity 97162      []  High Complexity [phone]

## 2018-10-12 NOTE — Unmapped (Signed)
PT DAILY TREATMENT NOTE    Diagnosis:   1. Multiple sclerosis (CMS Dx)     2. Impaired mobility and activities of daily living             Referring Provider:Zabeti, Aram, MD    Insurance plan:   Payor/Plan Subscr DOB Sex Relation Sub. Ins. ID Effective Group Num   1. Melanie [name] MANAGE* [name], [name] 01-22-1956 Female  [id number] 02/24/18 [id number]                                   [address] [address]                         # of visits per insurance authorization: BOMN with $40 co-pay, pt applied for financial assistance  # of visits per POC: up to 8    Date of Initial Eval: 10/13/18    Date of last POC:10/13/18    Goals of Therapy:     Goals of Therapy:     Short Term Goals: time frame 4 wks status   Pt will demonstrate/show compliance with a home exercise program based on therapy recommendations to maintain benefits gained in physical therapy.   Pt without updated HEP    To include   - B hip/trunk stretching (HS, piriformis. Lateral flexors);  - STM options  - proper sitting/posture/body mechanics     Pt will have only 1-2 episodes of LBP in a day episodes of pain come and go thru out the day   Pt will report a reduction in pain,  </= to 2/10 Pain ranges 4-5/10    Pt will improve oswestry score by 15% from baseline TBD   Pt to ambulate with out AD for 250 feet with minimal trendelenberg noted especially on the L and minimal pain Pt amb 20-40 feet at a time with no AD with increased left hip drop and decreased R foot clearance. Pt usually walks in community with RW.        ______________________________________________________________________  Pain:   /10         Location:                             Precautions: Multiple Sclerosis, DON'T stand on soft foam (creates shooting pain in BLEs)  Visual impairment with Reading    Activity Date: 10/13/18 Date:  Date: Date:    Visit # 1      Checked for POC Certification Sent to MD        Verified Signed POC:    YES[]       NO[]            Date:    Rerouted to Referral  Source for signature via: EMR[x]   Fax[]    Mail[]       Date:  Routed to PCP (if other than referring) for signature via: EMR[]   Fax[]     Mail[]      Date:  Was POC updated?        YES[]         NO[]                            Date:        If yes, Verified Signed POC:      YES[]   NO[]            Date:      CPT code  Daily treatment record Timed Coded Treatment Minutes   PT Moderate Complexity Eval: 95621 Eval completed 42   Therapeutic Exercise: 97110     Therapeutic Activities: 97530     Manual Therapy: 97140          Response to Treatment      Total Treatment TIme:  42     Plan:   (1)  HEP w/LARGE PRINT;       - B hip/trunk stretches (B hip/trunk stretching [HS, piriformis, lateral flexors];      - STM options      - proper sitting/posture/body mechanics   (2) reduce radicular sx  (3) balance activities and hip strengthning  (4) gait with out AD as tolerated    Additional Information:     Patient Education:

## 2018-10-13 ENCOUNTER — Encounter: Admit: 2018-10-13 | Discharge: 2018-10-13 | Payer: Medicare (Managed Care)

## 2018-10-13 DIAGNOSIS — R29898 Other symptoms and signs involving the musculoskeletal system: Secondary | ICD-10-CM

## 2018-10-13 NOTE — Unmapped (Signed)
Occupational Therapy Initial Evaluation    Name: Melanie [name] of Birth: 1956/05/14  MRN: [id number]    Date of evaluation: 10/13/2018    Referring Physician: Raeanne Gathers, MD  [address]  Neurology  [place], [place] [address] Phone: [phone]  Fax: [phone]  Specific Order: OT Evaluation    Date of Onset: 2004   Primary Medical Diagnosis:  799.89 (ICD-9-CM) - Z74.09 (ICD-10-CM) - Impaired mobility and ADLs          Insurance:  Note    10/06/2018//T[facility] IV/APS/[phone]/BENEFIT AUTH INFO  PT OT SP- PT. HAS  [employer] MEDICARE GOLD PLUS HMO INS PLAN  EFF DATE   3.1.19  DRAKE IS IN NETWORK   PT/OT/ST GETS UNLIMITED V BOMN PCY  NO PC REQ  MODALITY LIMIT? BOMN  DED: $0     MET: $   FAM $0   MET $  OOP: $3400    MET: $637.58   FAM $0   MET $  COPAY: $40 COPAY PER DAY,    CO-INS:    100%  DOES COPAY APPLY TO OOP?  YES  NO PRE-EXISTING  NO YRLY OR LIFETIME MAX   PER    JAMES         REF #  [id number]  PH #  [phone],   ??  [facility] THERAP[facility] IS A COVERED SERVICE,  IF MEDICARE PAYS PLAN WILL PAY,  THERAPIST MUST PROVE MEDICAL NECESSITY   IF PLAN DENIES PT WILL BE RESPONSIBLE        Insurance Precert Necessary:  []  yes; EVAL ONLY    [x]  no     History and Reason for Referral:: Melanie [name] is a 62 y.o. female who presents today with chief complaint legs are week, I can not stand for a long length of time, and memory is getting bad.     PMH/PSH:   Past Medical History:   Diagnosis Date   ??? Asthma    ??? Diabetes mellitus (CMS Dx)    ??? Hypertension    ??? Multiple sclerosis (CMS Dx)     Dx 2000   ??? Neuromuscular disorder (CMS Dx)     MS - dx 6 yrs ago   ,   Past Surgical History:   Procedure Laterality Date   ??? APPENDECTOMY     ??? HYSTERECTOMY      not sure what second type surgery she had. Had 2 surgeries, maybe partial hys then went back and toook the cervix.   ??? KNEE SURGERY         Medications:   She  has a past medical history of Asthma, Diabetes mellitus (CMS Dx), Hypertension, Multiple sclerosis (CMS Dx), and  Neuromuscular disorder (CMS Dx).  She does not have any pertinent problems on file.  She  has a past surgical history that includes Knee surgery; Appendectomy; and Hysterectomy.  Her family history includes Alcohol abuse in her father and other; Brain Cancer in her other; COPD in her father; Cancer in her father, maternal grandmother, mother, and sister; Diabetes in her daughter, paternal aunt, paternal grandfather, and paternal uncle; Skin Cancer in her other; Stomach Cancer in her other; Throat Cancer in her other.  She  reports that she has never smoked. She has never used smokeless tobacco. She reports current alcohol use. She reports that she does not use drugs.  She has a current medication list which includes the following prescription(s): albuterol, baclofen, benzonatate, blood sugar diagnostic, cholecalciferol (vitamin d3), empagliflozin, famotidine, ibuprofen,  inhalational spacing device, lovastatin, metformin, oxybutynin, pioglitazone-metformin, pregabalin, pregabalin, rebif rebidose, and triamterene-hydrochlorothiazide.  Current Outpatient Medications on File Prior to Visit   Medication Sig Dispense Refill   ??? albuterol (PROVENTIL HFA;VENTOLIN HFA) 90 mcg/actuation inhaler Inhale 2 puffs into the lungs. Every 4 to 6 hours as needed.      ??? baclofen (LIORESAL) 10 MG tablet TAKE ONE TABLET BY MOUTH THREE TIMES A DAY 90 tablet 5   ??? benzonatate (TESSALON) 100 MG capsule Take by mouth.     ??? blood sugar diagnostic Strp Test blood sugars up to twice a day 50 strip 6   ??? cholecalciferol, vitamin D3, 50,000 unit capsule Take 1 capsule (50,000 Units total) by mouth every 7 days. 12 capsule 3   ??? empagliflozin (JARDIANCE) 10 mg tablet Take by mouth.     ??? famotidine (PEPCID) 20 MG tablet Take by mouth.     ??? ibuprofen (ADVIL,MOTRIN) 800 MG tablet Take 1 tablet (800 mg total) by mouth every 8 hours as needed for Pain. 90 tablet 0   ??? inhalational spacing device (AEROCHAMBER) Spcr by Misc.(Non-Drug; Combo Route)  route. AEROCHAMBER PLUS.  Use with MDI      ??? lovastatin (MEVACOR) 40 MG tablet Take 40 mg by mouth daily with dinner.     ??? metFORMIN (GLUCOPHAGE) 500 MG tablet Take 1 tablet (500 mg total) by mouth 2 times a day with meals. 60 tablet 3   ??? oxybutynin (DITROPAN-XL) 10 MG 24 hr tablet Take 1 tablet (10 mg total) by mouth daily. Indications: Urinary Urgency 30 tablet 0   ??? pioglitazone-metformin (ACTOPLUS MET) 15-850 mg per tablet Take 1 tablet by mouth daily.     ??? pregabalin (LYRICA) 100 MG capsule Take 100 mg by mouth 2 times a day. Indications: Take one capsule by mouth twice a day     ??? pregabalin (LYRICA) 150 MG capsule Take 1 capsule (150 mg total) by mouth 2 times a day. 60 capsule 5   ??? REBIF REBIDOSE 44 mcg/0.5 mL PnIj Inject under the skin three times a week as directed by physician. 36 Syringe 3   ??? triamterene-hydrochlorothiazide (MAXZIDE-25) 37.5-25 mg tablet Take 1 tablet by mouth daily.       No current facility-administered medications on file prior to visit.      She is allergic to compazine [prochlorperazine edisylate] and prochlorperazine.    Precautions/Contraindications: HTN,( patient reports that it is under control)    Home / Living Situation:      Lives with: lives by self in the back of house , family lives in front but does not provide any assistance to patient.   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:    Bathroom:      [x]  Tub/shower   []  Walk in shower   [x]  Shower chair    []  Tub transfer bench    []  Bidet    []  Grab bars     [x]  Hand held shower head    []  Raised commode Seat    []   BSC    []  Other   Mobility:     []  No AD   []  power wheelchair    []  manual Wiliam Ke wheelchair     [x]  walker     []  cane     []  rollator    Home equipment:     []  Hoyer Lift    []  Slide board    []  Bedside table     []  Hospital bed     []  Lift chair    Objective:   Pain: Level (0-10): 210 at present     Location: lower back and sometimes in thigh    10/10 at worst;  0/10 at best        Level of Function: Information obtained by report and functional assessment      PLOF CLOF Comments:    ADLs         -feeding Independent Independent       -bathing Independent Independent       -dressing Independent Independent       -toileting Independent Independent    IADLs         -meal prep Independent Needs assistance        -housekeeping Independent Needs assistance COA will be helping       -laundry Independent Needs assistance CO[employer] will be helping    Home Management         -finances    Independent Independent       -medications Independent Independent    Transfers Independent Independent    Work Independent On disability     Driving Independent Independent        Balance/ Postural Control:    Sitting:  [x]   WFL []   Impaired []   Absent []  N/T   Standing:   []   WFL [x]   Impaired []   Absent []  N/T Fair +     Gait:    walks with a walker     BUE Strength , Range of Motion, Grip, FMC:     Location LUE ROM LUE MMT RUE ROM RUE MMT   Shoulder WFL 4-/5 WFL 3+/5   Elbow [facility] 4/5 WFL 3+/5   Wrist WFL 4/5 WFL 3+/5   Hand WFL NT WFL NT   Grasp WFL NT WFL NT            Standardized Assessments Left Right   Dynamometer   Age norms: 42 lbs 38 lbs   Box and Blocks  Age norms: NT NT   9 Hole Peg test  Age norms: 29.23 seconds 24.14 seconds       Dominant Hand: [x]  right []  left  UE Splints:  []  yes  [x]  no   Edema:    [x]  none []  minimal []  moderate [] severe :                Subluxation:  []  yes  [x]  no       Sensation:    Left  Right   [x]  Intact   []  Impaired   []  absent Light Touch [x]  Intact   []  Impaired   []  absent   [x]  Intact   []  Impaired   []  absent Hot/ Cold [x]  Intact   []  Impaired   []  absent   [x]  Intact   []  Impaired   []  absent Proprioceptive [x]   Intact   []  Impaired   []  absent       Cognition Status:   []  functional for basic conversation    []   impaired attention    [x]  impaired memory   []  impaired awareness/impaired insight    []  unable to assess due to communication deficits    [x]  further assessment needed within context of functional and therapeutic interventions     Visual Perception:    []  N/T  [x]  WFL   []  Impaired   []  Absent  []  Wears glasses   []  Visual field deficit    []  diplopia    []  other    Learning Needs Assessment:  [x]  Patient is able to communicate with therapist and verbalize understanding of directions/instructions  []  Patient is unable to communicate with therapist and/or verbalize understanding of directions/understanding due to the following barriers to learning:      []  Reading     []  Language     []  Visual     []  Hearing     []  Other:  Is an interpreter required? [] Yes   [x]  No    Assessment/Problem List: Patient presents as outlined below . Patient would benefit from skilled OT to address the indicated deficits.     Moderate Complexity OT Evaluation (CPT [phone])  Melanie [name] presents with a moderate complexity level occupational profile, assessment, and clinical decision making requirement to complete this evaluation per Medicare guidelines for her diagnosis of MS.  Melanie [name] UEAVW'U assessment identified 3-5 performance deficits that include decreased strength  decreased ambulation/ability to do stairs  dependent with HEP  decreased balance/ coordinationUE function, ADLs, balance, functional mobility and activity tolerance that result in activity limitations and/or participation restrictions.  Clinical decision making is of moderate analytic complexity, which includes analysis of the occupational profile, data from the detailed assessment, and consideration of several treatment options that include therapeutic exercise, functional mobility, ADL retraining, balance training, activity tolerance training, therapeutic activity,  patient/family education and fine motor coordination.  The patient's comorbidities that affect her occupational performance are HTN  Minimal to moderate task modification and/or assistance (e.g., physical or verbal) was required to enable the patient to complete this evaluation.  Patient would benefit from skilled OT to address the aforementioned deficits. Patient/caregivers received education on the purpose of therapy and participated in the development of the POC and goals.    Rehabilitation Potential: Based upon this therapist's assessment, Melanie [name] has the following rehabilitation potential for OT goals stated in this evaluation: good    Occupational Therapy Plan of Care:  1-2 times weekly for 6 weeks modalities : TA, TE ADL, Cognitve   Patient/Family Goals: To not fall and in cease energy    Therapy Goals:     Short Term Goals to be met within 3 weeks  INITIAL STATUS REASSESSMENT:  DATE:   1. Patient will demonstrate independence with progressive HEP for UE Strengthening and fine motor coordination to address functional deficits with continued progress after discharge.     Lack Strengthening program  []  PROGRESS MADE/        CONTINUE GOAL  []  GOAL MET  []  DC GOAL   Long Term Goals to be met within 6 weeks  INITIAL STATUS REASSESSMENT:   1. Patient will identify energy conservation techniques with functional home making activities; and Independently identify     Lack knowledge of energy techniques  []  PROGRESS MADE/        CONTINUE GOAL  []  GOAL MET  []  DC GOAL     2.Patient will participate in further cognitive testing.  Expresses problems with memory  []  PROGRESS MADE/  CONTINUE GOAL  []  GOAL MET  []  DC GOAL     3. Patient will identify ways to decrease changes of falls at home  Reports hx of falls at home  []  PROGRESS MADE/        CONTINUE GOAL  []  GOAL MET  []  DC GOAL     4.  []  PROGRESS MADE/        CONTINUE GOAL  []  GOAL MET  []  DC GOAL       Plan:    Treatment to Include: therapeutic exercise,  therapeutic activity, cognitive retraining, moist heat/cold pack and self-care/home management  Patient/Family Agree to Treatment: Yes     Treatment Diagnosis: No diagnosis found.    Therapy Frequency/Duration: Patient to receive skilled OT services 1-2  times per week for 6 weeks starting from the first scheduled follow up visit, or up to a total of 12 *visits within this POC.   Certification Period: 10/13/2018 - 90 days    [x]  SKILLED TREATMENT PROVIDED THIS SESSION: 15 minutes  TA balance and coordination exercises.   Education provided this date: Patient/family received education on the role and  purpose of therapy, participated in the development of the POC and verbalized and understanding and agreement of POC including goals and interventions. Reviewed scheduling for appointments.          Education to: [x]  Patient []  Significant other   []  Caregiver []  other   Method: [x]  Explanation []  Demonstration   []  Handouts []  other   Response: []  Verbalized understanding [x]  Demonstrated understanding []  Needs reinforcement []  other     Timed Code Treatment Minutes: 15  minutes  Total Treatment Time: 45  minutes      Therapist Signature: Sharyne Richters, OTR/L  Date: 10/13/2018      Physical Certification:  I certify that the above patient is under my care and requires the above services.  These professional services are to be provided from an established plan related to the diagnosis and reviewed by me every 90 days.      Physician's Name:  ____________________________      Physician's Signature:  __________________________ Date:   ___________

## 2018-10-20 ENCOUNTER — Encounter: Admit: 2018-10-20 | Payer: Medicare (Managed Care)

## 2018-10-20 ENCOUNTER — Encounter: Admit: 2018-10-20 | Discharge: 2018-10-20 | Payer: Medicare (Managed Care)

## 2018-10-20 DIAGNOSIS — G35 Multiple sclerosis: Secondary | ICD-10-CM

## 2018-10-20 NOTE — Unmapped (Signed)
Occupational Therapy Encounter Note   Visit #2    Name: Melanie [name] of Birth: 1956/02/29  MRN: [id number]    Date of evaluation: 10/13/18  Referring Physician: Raeanne Gathers, MD  [address]  Neurology  [place], [place] [address] Phone: [phone]  Fax: [phone]  Specific Order: OT Evaluation    Date of Onset: 2004   Primary Medical Diagnosis:  799.89 (ICD-9-CM) - Z74.09 (ICD-10-CM) - Impaired mobility and ADLs          Insurance:  Note    10/06/2018//TDC IV/APS/[phone]/BENEFIT AUTH INFO  PT OT SP- PT. HAS  [employer] MEDICARE GOLD PLUS HMO INS PLAN  EFF DATE   3.1.19  DRAKE IS IN NETWORK   PT/OT/ST GETS UNLIMITED V BOMN PCY  NO PC REQ  MODALITY LIMIT? BOMN  DED: $0     MET: $   FAM $0   MET $  OOP: $3400    MET: $637.58   FAM $0   MET $  COPAY: $40 COPAY PER DAY,    CO-INS:    100%  DOES COPAY APPLY TO OOP?  YES  NO PRE-EXISTING  NO YRLY OR LIFETIME MAX   PER    JAMES         REF #  [id number]  PH #  [phone],   ??  AQUATIC THERAPY IS A COVERED SERVICE,  IF MEDICARE PAYS PLAN WILL PAY,  THERAPIST MUST PROVE MEDICAL NECESSITY   IF PLAN DENIES PT WILL BE RESPONSIBLE        Insurance Precert Necessary:  []  yes; EVAL ONLY    [x]  no     History and Reason for Referral:: Melanie [name] is a 62 y.o. female who presents today with chief complaint legs are week, I can not stand for a long length of time, and memory is getting bad.     PMH/PSH:   Past Medical History:   Diagnosis Date   ??? Asthma    ??? Diabetes mellitus (CMS Dx)    ??? Hypertension    ??? Multiple sclerosis (CMS Dx)     Dx 2000   ??? Neuromuscular disorder (CMS Dx)     MS - dx 6 yrs ago   ,   Past Surgical History:   Procedure Laterality Date   ??? APPENDECTOMY     ??? HYSTERECTOMY      not sure what second type surgery she had. Had 2 surgeries, maybe partial hys then went back and toook the cervix.   ??? KNEE SURGERY         Medications:   She  has a past medical history of Asthma, Diabetes mellitus (CMS Dx), Hypertension, Multiple sclerosis (CMS Dx), and  Neuromuscular disorder (CMS Dx).  She does not have any pertinent problems on file.  She  has a past surgical history that includes Knee surgery; Appendectomy; and Hysterectomy.  Her family history includes Alcohol abuse in her father and other; Brain Cancer in her other; COPD in her father; Cancer in her father, maternal grandmother, mother, and sister; Diabetes in her daughter, paternal aunt, paternal grandfather, and paternal uncle; Skin Cancer in her other; Stomach Cancer in her other; Throat Cancer in her other.  She  reports that she has never smoked. She has never used smokeless tobacco. She reports current alcohol use. She reports that she does not use drugs.  She has a current medication list which includes the following prescription(s): albuterol, baclofen, benzonatate, blood sugar diagnostic, cholecalciferol (vitamin d3), empagliflozin,  famotidine, ibuprofen, inhalational spacing device, lovastatin, metformin, oxybutynin, pioglitazone-metformin, pregabalin, pregabalin, rebif rebidose, and triamterene-hydrochlorothiazide.  Current Outpatient Medications on File Prior to Visit   Medication Sig Dispense Refill   ??? albuterol (PROVENTIL HFA;VENTOLIN HFA) 90 mcg/actuation inhaler Inhale 2 puffs into the lungs. Every 4 to 6 hours as needed.      ??? baclofen (LIORESAL) 10 MG tablet TAKE ONE TABLET BY MOUTH THREE TIMES A DAY 90 tablet 5   ??? benzonatate (TESSALON) 100 MG capsule Take by mouth.     ??? blood sugar diagnostic Strp Test blood sugars up to twice a day 50 strip 6   ??? cholecalciferol, vitamin D3, 50,000 unit capsule Take 1 capsule (50,000 Units total) by mouth every 7 days. 12 capsule 3   ??? empagliflozin (JARDIANCE) 10 mg tablet Take by mouth.     ??? famotidine (PEPCID) 20 MG tablet Take by mouth.     ??? ibuprofen (ADVIL,MOTRIN) 800 MG tablet Take 1 tablet (800 mg total) by mouth every 8 hours as needed for Pain. 90 tablet 0   ??? inhalational spacing device (AEROCHAMBER) Spcr by Misc.(Non-Drug; Combo Route)  route. AEROCHAMBER PLUS.  Use with MDI      ??? lovastatin (MEVACOR) 40 MG tablet Take 40 mg by mouth daily with dinner.     ??? metFORMIN (GLUCOPHAGE) 500 MG tablet Take 1 tablet (500 mg total) by mouth 2 times a day with meals. 60 tablet 3   ??? oxybutynin (DITROPAN-XL) 10 MG 24 hr tablet Take 1 tablet (10 mg total) by mouth daily. Indications: Urinary Urgency 30 tablet 0   ??? pioglitazone-metformin (ACTOPLUS MET) 15-850 mg per tablet Take 1 tablet by mouth daily.     ??? pregabalin (LYRICA) 100 MG capsule Take 100 mg by mouth 2 times a day. Indications: Take one capsule by mouth twice a day     ??? pregabalin (LYRICA) 150 MG capsule Take 1 capsule (150 mg total) by mouth 2 times a day. 60 capsule 5   ??? REBIF REBIDOSE 44 mcg/0.5 mL PnIj Inject under the skin three times a week as directed by physician. 36 Syringe 3   ??? triamterene-hydrochlorothiazide (MAXZIDE-25) 37.5-25 mg tablet Take 1 tablet by mouth daily.       No current facility-administered medications on file prior to visit.      She is allergic to compazine [prochlorperazine edisylate] and prochlorperazine.    Precautions/Contraindications: HTN,( patient reports that it is under control)  Since the last visit patient reports the following answers to the below questions  Have you fallen since your last visit?  If yes where and what were you doing? Yes leaning for a cup in th car  2. Any change in your medical condition since last visit?NO  3. Were you seen in the Ed or admitted to the hospital?NO      VISIT COUNT  #2       Rehabilitation Potential: Based upon this therapist's assessment, ZOII FLORER has the following rehabilitation potential for OT goals stated in this evaluation: good    Occupational Therapy Plan of Care:  1-2 times weekly for 6 weeks modalities : TA, TE ADL, Cognitve   Patient/Family Goals: To not fall and in cease energy    Therapy Goals:     Short Term Goals to be met within 3 weeks  INITIAL STATUS REASSESSMENT:  DATE:   1. Patient will  demonstrate independence with progressive HEP for UE Strengthening and fine motor coordination to address functional  deficits with continued progress after discharge.     Lack Strengthening program  []  PROGRESS MADE/        CONTINUE GOAL  []  GOAL MET  []  DC GOAL   Long Term Goals to be met within 6 weeks  INITIAL STATUS REASSESSMENT:   1. Patient will identify energy conservation techniques with functional home making activities; and Independently identify     Lack knowledge of energy techniques  []  PROGRESS MADE/        CONTINUE GOAL  []  GOAL MET  []  DC GOAL     2.Patient will participate in further cognitive testing.  Expresses problems with memory  []  PROGRESS MADE/        CONTINUE GOAL  []  GOAL MET  []  DC GOAL     3. Patient will identify ways to decrease changes of falls at home  Reports hx of falls at home  []  PROGRESS MADE/        CONTINUE GOAL  []  GOAL MET  []  DC GOAL     4.  []  PROGRESS MADE/        CONTINUE GOAL  []  GOAL MET  []  DC GOAL        CPT code Skilled interventions and education Timed code treatment minutes   therapeutic exercise  HEP with focus on yellow theraband Mod verbal cues to complete completely  Red putty with focus on function  30 minutes    ADLS  ??N/A  ??   therapeutic activity ??Completed the following BITS Assessment  Bell Cancellation Assessment  The Bell Cancellation task allows for a quantitative and qualitative assessment of visual neglect in the near extrapersonal space. The user is asked to identify and touch all 35 bell images embedded within 280 distractors (houses, horses, etc.).  Patient missed one bell   Maze Assessment  The Maze program assesses attention, visuoconstructional ability and executive functions of planning and foresight. Performance is scored according to time to complete the test and total number of errors. Errors are determined by counting the number of times the user entered a dead-end alley or failed to stay within the lines.  Delay in time and control    DelayedVisual Scanning and Motor Reaction Assessment.   This four part trial assesses eye hand coordination, peripheral awareness, hand speed, reaction time and endurance. Targets appear on the screen one at a time. Users are instructed to visually scan the screen and touch each stimulus as it appears. The objective is to correctly touch as many stimuli as possible, as quickly as possible. The Visual Scanning & Motor Reaction Assessment consists of 4 Levels, and each level is progressively more difficult than the previous levels. The client must pass Level 1 to proceed to Level 2 and so on. The client is expected to complete all four levels in a single therapy session, but rest periods between each level are allowed.  Level 1  ??? Program: Single Target - User Paced  ??? Requirements: 50 Hits / 60 sec  ??? Description: Each stimulus will remain on the screen until it is touched.  Failed level one, reaction time delayed and visually did not see all items.   Marland Kitchen ??15 minutes    ?? ?? ??   Start:1413 pm   Stop:   1458 ?? Total treatment time:45 minutes                 Plan:    Treatment to Include: therapeutic exercise, therapeutic activity, cognitive retraining,  moist heat/cold pack and self-care/home management  Patient/Family Agree to Treatment: Yes     Treatment Diagnosis:   1. Multiple sclerosis (CMS Dx)     2. Optic neuritis         Therapy Frequency/Duration: Patient to receive skilled OT services 1-2  times per week for 6 weeks starting from the first scheduled follow up visit, or up to a total of 12 *visits within this POC.   Certification Period: 10/20/2018 - 90 days    Issued HEP with focus on theraband UE strengthening and Putty exercises  Max cues to follow hand out.     Education to: [x]  Patient []  Significant other   []  Caregiver []  other   Method: [x]  Explanation []  Demonstration   []  Handouts []  other   Response: []  Verbalized understanding [x]  Demonstrated understanding []  Needs reinforcement []  other     Timed  Code Treatment Minutes: 15  minutes  Total Treatment Time: 45  minutes      Therapist Signature: Sharyne Richters, OTR/L  Date: 10/20/2018      Physical Certification:  I certify that the above patient is under my care and requires the above services.  These professional services are to be provided from an established plan related to the diagnosis and reviewed by me every 90 days.      Physician's Name:  ____________________________      Physician's Signature:  __________________________ Date:   ___________

## 2018-10-20 NOTE — Unmapped (Signed)
PT DAILY TREATMENT NOTE    Diagnosis:   1. Multiple sclerosis (CMS Dx)     2. Impaired mobility and activities of daily living             Referring Provider:Zabeti, Aram, MD    Insurance plan:   Payor/Plan Subscr DOB Sex Relation Sub. Ins. ID Effective Group Num   1. Francine Graven MANAGE* BRANDEN, VINE 08/22/1956 Female  [id number] 02/24/18 [id number]                                   [address] [address]                         # of visits per insurance authorization: BOMN with $40 co-pay, pt applied for financial assistance  # of visits per POC: up to 8    Date of Initial Eval: 10/13/18    Date of last POC:10/13/18    Goals of Therapy:     Goals of Therapy:     Short Term Goals: time frame 4 wks status   Pt will demonstrate/show compliance with a home exercise program based on therapy recommendations to maintain benefits gained in physical therapy.   Pt without updated HEP    To include   - B hip/trunk stretching (HS, piriformis. Lateral flexors);  - STM options  - proper sitting/posture/body mechanics     Pt will have only 1-2 episodes of LBP in a day episodes of pain come and go thru out the day   Pt will report a reduction in pain,  </= to 2/10 Pain ranges 4-5/10    Pt will improve oswestry score by 15% from baseline TBD   Pt to ambulate with out AD for 250 feet with minimal trendelenberg noted especially on the L and minimal pain Pt amb 20-40 feet at a time with no AD with increased left hip drop and decreased R foot clearance. Pt usually walks in community with RW.        ______________________________________________________________________  Pain:   /10         Location:                             Precautions: Multiple Sclerosis, DON'T stand on soft foam (creates shooting pain in BLEs)  Visual impairment with Reading    Activity Date: 10/13/18 Date: 10/20/18 Date: Date:    Visit # 1  2     Checked for POC Certification Sent to MD        Verified Signed POC:    YES[]       NO[]            Date:    Rerouted to  Referral Source for signature via: EMR[x]   Fax[]    Mail[]       Date:  Routed to PCP (if other than referring) for signature via: EMR[]   Fax[]     Mail[]      Date:  Was POC updated?        YES[]         NO[]                            Date:        If yes, Verified Signed POC:      YES[]   NO[]            Date:      CPT code  Daily treatment record Timed Coded Treatment Minutes   PT Moderate Complexity Eval: 16109     Therapeutic Exercise: 97110 KTC, piri w/ towel, hs 2h30   8   Therapeutic Activities: 97530     Manual Therapy: 97140  Ant hip mobs, lumbar opening  23    Ice/Heat  Hot pack R hip,  10   Response to Treatment      Total Treatment TIme:   41 mins     Plan:   (1)  HEP w/LARGE PRINT;       - B hip/trunk stretches (B hip/trunk stretching [HS, piriformis, lateral flexors];      - STM options      - proper sitting/posture/body mechanics   (2) reduce radicular sx  (3) balance activities and hip strengthning  (4) gait with out AD as tolerated    Additional Information:     Patient Education:

## 2018-10-27 ENCOUNTER — Encounter: Admit: 2018-10-27 | Discharge: 2018-10-27 | Payer: Medicare (Managed Care)

## 2018-10-27 DIAGNOSIS — G35 Multiple sclerosis: Secondary | ICD-10-CM

## 2018-10-27 DIAGNOSIS — R269 Unspecified abnormalities of gait and mobility: Secondary | ICD-10-CM

## 2018-10-27 NOTE — Unmapped (Signed)
PT DAILY TREATMENT NOTE    Diagnosis:   1. Multiple sclerosis (CMS Dx)             Referring Provider:Zabeti, Aram, MD    Insurance plan:   Payor/Plan Subscr DOB Sex Relation Sub. Ins. ID Effective Group Num   1. Francine Graven MANAGE* LEANORA, MURIN 07-Nov-1956 Female  [id number] 02/24/18 [id number]                                   [address] [address]                         # of visits per insurance authorization: BOMN with $40 co-pay, pt applied for financial assistance  # of visits per POC: up to 8    Date of Initial Eval: 10/13/18    Date of last POC:10/13/18    Goals of Therapy:     Goals of Therapy:     Short Term Goals: time frame 4 wks status   Pt will demonstrate/show compliance with a home exercise program based on therapy recommendations to maintain benefits gained in physical therapy.   Pt without updated HEP    To include   - B hip/trunk stretching (HS, piriformis. Lateral flexors);  - STM options  - proper sitting/posture/body mechanics     Pt will have only 1-2 episodes of LBP in a day episodes of pain come and go thru out the day   Pt will report a reduction in pain,  </= to 2/10 Pain ranges 4-5/10    Pt will improve oswestry score by 15% from baseline TBD   Pt to ambulate with out AD for 250 feet with minimal trendelenberg noted especially on the L and minimal pain Pt amb 20-40 feet at a time with no AD with increased left hip drop and decreased R foot clearance. Pt usually walks in community with RW.        ______________________________________________________________________  Pain:   /10         Location:                             Precautions: Multiple Sclerosis, DON'T stand on soft foam (creates shooting pain in BLEs)  Visual impairment with Reading    Activity Date: 10/13/18 Date: 10/20/18 Date: 10/27/18 Date:    Visit # 1  2  3     Checked for POC Certification Sent to MD        Verified Signed POC:    YES[x]       NO[]            Date:    Cosigned by: Raeanne Gathers, MD at 10/13/2018 ??9:22 AM           Rerouted to Referral Source for signature via: EMR[x]   Fax[]    Mail[]       Date:  Routed to PCP (if other than referring) for signature via: EMR[]   Fax[]     Mail[]      Date:  Was POC updated?        YES[]         NO[]                            Date:        If yes, Verified Signed POC:  YES[]             NO[]            Date:      SUBJECTIVE:  Pt arrived 20 minutes late.  Reports of R hip pain and stiffness.  Reports pain 2/10.      OBJECTIVE: see table  CPT code  Daily treatment record Timed Coded Treatment Minutes   PT Moderate Complexity Eval: 16109     Therapeutic Exercise: 97110 SciFit Lvl 1.0 BUE BLE  12   Therapeutic Activities: 97530     Manual Therapy: 97140  R Ant hip mobs, lumbar opening   PROM hs, glute, piri s. 2h30  27    Ice/Heat  Hot pack R hip,     Response to Treatment  Pt tol treatment well.  Pt reports decreased pain and tightness post therapy.  Pt demo's less L hip drop on exit.     Total Treatment TIme:    39 mins     Plan:   (1)  HEP w/LARGE PRINT;       - B hip/trunk stretches (B hip/trunk stretching [HS, piriformis, lateral flexors];      - STM options      - proper sitting/posture/body mechanics   (2) reduce radicular sx  (3) balance activities and hip strengthning  (4) gait with out AD as tolerated    Additional Information:     Patient Education:

## 2018-10-27 NOTE — Unmapped (Signed)
Occupational Therapy Encounter Note   Visit #2    Name: Melanie [name] Date of Birth: 06/28/1956  MRN: [id number]    Date of evaluation: 10/13/18  Referring Physician: Raeanne Gathers, MD  [address]  Neurology  [place], [place] [address] Phone: [phone]  Fax: [phone]  Specific Order: OT Evaluation    Date of Onset: 2004   Primary Medical Diagnosis:  799.89 (ICD-9-CM) - Z74.09 (ICD-10-CM) - Impaired mobility and ADLs          Insurance:  Note    10/06/2018//[facility] IV/[phone]/BENEFIT AUTH INFO  PT OT SP- PT. HAS  [employer] MEDICARE GOLD PLUS HMO INS PLAN  EFF DATE   3.1.19  DRAKE IS IN NETWORK   PT/OT/ST GETS UNLIMITED V BOMN PCY  NO PC REQ  MODALITY LIMIT? BOMN  DED: $0     MET: $   FAM $0   MET $  OOP: $3400    MET: $637.58   FAM $0   MET $  COPAY: $40 COPAY PER DAY,    CO-INS:    100%  DOES COPAY APPLY TO OOP?  YES  NO PRE-EXISTING  NO YRLY OR LIFETIME MAX   PER    JAMES         REF #  [id number]  PH #  [phone],   ??  [facility] IS A COVERED SERVICE,  IF MEDICARE PAYS PLAN WILL PAY,  THERAPIST MUST PROVE MEDICAL NECESSITY   IF PLAN DENIES PT WILL BE RESPONSIBLE        Insurance Precert Necessary:  []  yes; EVAL ONLY    [x]  no     History and Reason for Referral:: Melanie [name] is a 62 y.o. female who presents today with chief complaint legs are week, I can not stand for a long length of time, and memory is getting bad.     PMH/PSH:   Past Medical History:   Diagnosis Date   ??? Asthma    ??? Diabetes mellitus (CMS Dx)    ??? Hypertension    ??? Multiple sclerosis (CMS Dx)     Dx 2000   ??? Neuromuscular disorder (CMS Dx)     MS - dx 6 yrs ago   ,   Past Surgical History:   Procedure Laterality Date   ??? APPENDECTOMY     ??? HYSTERECTOMY      not sure what second type surgery she had. Had 2 surgeries, maybe partial hys then went back and toook the cervix.   ??? KNEE SURGERY         Medications:   She  has a past medical history of Asthma, Diabetes mellitus (CMS Dx), Hypertension, Multiple sclerosis (CMS Dx), and  Neuromuscular disorder (CMS Dx).  She does not have any pertinent problems on file.  She  has a past surgical history that includes Knee surgery; Appendectomy; and Hysterectomy.  Her family history includes Alcohol abuse in her father and other; Brain Cancer in her other; COPD in her father; Cancer in her father, maternal grandmother, mother, and sister; Diabetes in her daughter, paternal aunt, paternal grandfather, and paternal uncle; Skin Cancer in her other; Stomach Cancer in her other; Throat Cancer in her other.  She  reports that she has never smoked. She has never used smokeless tobacco. She reports current alcohol use. She reports that she does not use drugs.  She has a current medication list which includes the following prescription(s): albuterol, baclofen, benzonatate, blood sugar diagnostic, cholecalciferol (vitamin d3),  empagliflozin, famotidine, ibuprofen, inhalational spacing device, lovastatin, metformin, oxybutynin, pioglitazone-metformin, pregabalin, pregabalin, rebif rebidose, and triamterene-hydrochlorothiazide.     Current Outpatient Medications on File Prior to Visit   Medication Sig Dispense Refill   ??? albuterol (PROVENTIL HFA;VENTOLIN HFA) 90 mcg/actuation inhaler Inhale 2 puffs into the lungs. Every 4 to 6 hours as needed.      ??? baclofen (LIORESAL) 10 MG tablet TAKE ONE TABLET BY MOUTH THREE TIMES A DAY 90 tablet 5   ??? benzonatate (TESSALON) 100 MG capsule Take by mouth.     ??? blood sugar diagnostic Strp Test blood sugars up to twice a day 50 strip 6   ??? cholecalciferol, vitamin D3, 50,000 unit capsule Take 1 capsule (50,000 Units total) by mouth every 7 days. 12 capsule 3   ??? empagliflozin (JARDIANCE) 10 mg tablet Take by mouth.     ??? famotidine (PEPCID) 20 MG tablet Take by mouth.     ??? ibuprofen (ADVIL,MOTRIN) 800 MG tablet Take 1 tablet (800 mg total) by mouth every 8 hours as needed for Pain. 90 tablet 0   ??? inhalational spacing device (AEROCHAMBER) Spcr by Misc.(Non-Drug; Combo Route)  route. AEROCHAMBER PLUS.  Use with MDI      ??? lovastatin (MEVACOR) 40 MG tablet Take 40 mg by mouth daily with dinner.     ??? metFORMIN (GLUCOPHAGE) 500 MG tablet Take 1 tablet (500 mg total) by mouth 2 times a day with meals. 60 tablet 3   ??? oxybutynin (DITROPAN-XL) 10 MG 24 hr tablet Take 1 tablet (10 mg total) by mouth daily. Indications: Urinary Urgency 30 tablet 0   ??? pioglitazone-metformin (ACTOPLUS MET) 15-850 mg per tablet Take 1 tablet by mouth daily.     ??? pregabalin (LYRICA) 100 MG capsule Take 100 mg by mouth 2 times a day. Indications: Take one capsule by mouth twice a day     ??? pregabalin (LYRICA) 150 MG capsule Take 1 capsule (150 mg total) by mouth 2 times a day. 60 capsule 5   ??? REBIF REBIDOSE 44 mcg/0.5 mL PnIj Inject under the skin three times a week as directed by physician. 36 Syringe 3   ??? triamterene-hydrochlorothiazide (MAXZIDE-25) 37.5-25 mg tablet Take 1 tablet by mouth daily.       No current facility-administered medications on file prior to visit.      She is allergic to compazine [prochlorperazine edisylate] and prochlorperazine.    Precautions/Contraindications: HTN,( patient reports that it is under control)  Since the last visit patient reports the following answers to the below questions  Have you fallen since your last visit?  If yes where and what were you doing? Yes leaning for a cup in th car  2. Any change in your medical condition since last visit?NO  3. Were you seen in the Ed or admitted to the hospital?NO      VISIT COUNT  #3       Rehabilitation Potential: Based upon this therapist's assessment, Melanie [name] has the following rehabilitation potential for OT goals stated in this evaluation: good    Occupational Therapy Plan of Care:  1-2 times weekly for 6 weeks modalities : TA, TE ADL, Cognitve   Patient/Family Goals: To not fall and in cease energy    Therapy Goals:     Short Term Goals to be met within 3 weeks  INITIAL STATUS REASSESSMENT:  DATE:   1. Patient will  demonstrate independence with progressive HEP for UE Strengthening and fine motor  coordination to address functional deficits with continued progress after discharge.     Lack Strengthening program  []  PROGRESS MADE/        CONTINUE GOAL  []  GOAL MET  []  DC GOAL   Long Term Goals to be met within 6 weeks  INITIAL STATUS REASSESSMENT:   1. Patient will identify energy conservation techniques with functional home making activities; and Independently identify     Lack knowledge of energy techniques  []  PROGRESS MADE/        CONTINUE GOAL  []  GOAL MET  []  DC GOAL     2.Patient will participate in further cognitive testing.  Expresses problems with memory  []  PROGRESS MADE/        CONTINUE GOAL  []  GOAL MET  []  DC GOAL     3. Patient will identify ways to decrease changes of falls at home  Reports hx of falls at home  []  PROGRESS MADE/        CONTINUE GOAL  []  GOAL MET  []  DC GOAL     4.  []  PROGRESS MADE/        CONTINUE GOAL  []  GOAL MET  []  DC GOAL        CPT code Skilled interventions and education Timed code treatment minutes   therapeutic exercise Reviewed HEP including B UE theraband and theraputty exercise to ensure patient is able to follow the written handout and perform the exercise correctly. Education on importance of correct techniques for joint protection. Patient will continue previously instructed theraband exercise using yellow band; added theraputty exercise to include isolated opposition and gross grasping, discontinued hook fisting due to causing shooting sensation in R wrist 17 minutes    ADLS  ?? ??   therapeutic activity ??Implemented [employer] Cognitive Assessment  The Montreal Cognitive Assessment (MoCA) was designed as a rapid screening instrument for mild cognitive dysfunction. It assesses different cognitive domains: attention and concentration, executive functions, memory, language, visuoconstructional skills, conceptual thinking, calculations, and orientation. Time to administer the MoCA is  approximately 10 minutes. The total possible score is 30 points; a score of 26 or above is considered normal.    Visuospatial and Executive Functioning 4 / 5   Animal Naming 3 / 3   Attention 6 / 6   Language 2 / 3   Abstraction 2 / 2   Delayed Recall (Short-term Memory) 2 / 5   Orientation 6 / 6   Education Level  (1 point is added to the test-taker's score if he or  she has 12 years or less of formal education)    Patient's score:     25 / 30   Scoring:(may indicate)   18 - 26 = mild cognitive impairment  10 - 17= moderate cognitive impairment   0 - 9  = severe cognitive impairment.      Instructed patient to engage in tasks incorporating spaced retrieval concept: patient was instructed patient to place one cloth pin on one item in bathroom, living room, and bedroom and then wait for 1 minute while engaging in energy conservation education to recall placement without visual cues and then progress to increasing amount of cloth pins and durations. Patient demonstrated increased difficulty with 4+ cloth pins and 2+ minutes. Instructed patient to engage in games at home that involving memory aspects; patient verbalized understanding. Provided patient with handout for energy conservation with verbal instruction on application to daily activities. Patient verbalized understanding.  ??33 minutes    ?? ?? ??  Start:1155 pm   Stop:  1245 pm ?? Total treatment time:50 minutes                 Plan:    Treatment to Include: therapeutic exercise, therapeutic activity, cognitive retraining, moist heat/cold pack and self-care/home management  Patient/Family Agree to Treatment: Yes     Treatment Diagnosis:   1. Multiple sclerosis (CMS Dx)         Therapy Frequency/Duration: Patient to receive skilled OT services 1-2  times per week for 6 weeks starting from the first scheduled follow up visit, or up to a total of 12 *visits within this POC.   Certification Period: 10/27/2018 - 90 days    Issued HEP with focus on theraband UE  strengthening and Putty exercises  Max cues to follow hand out.     Education to: [x]  Patient []  Significant other   []  Caregiver []  other   Method: [x]  Explanation []  Demonstration   []  Handouts []  other   Response: []  Verbalized understanding [x]  Demonstrated understanding []  Needs reinforcement []  other     Timed Code Treatment Minutes: 15  minutes  Total Treatment Time: 45  minutes      Therapist Signature: CHANG-TUNG Johonna Binette, OTR/L  Date: 10/27/2018      Physical Certification:  I certify that the above patient is under my care and requires the above services.  These professional services are to be provided from an established plan related to the diagnosis and reviewed by me every 90 days.      Physician's Name:  ____________________________      Physician's Signature:  __________________________ Date:   ___________

## 2018-11-06 ENCOUNTER — Encounter: Admit: 2018-11-06 | Discharge: 2018-11-06 | Payer: Medicare (Managed Care)

## 2018-11-06 DIAGNOSIS — G35 Multiple sclerosis: Secondary | ICD-10-CM

## 2018-11-06 NOTE — Unmapped (Signed)
PT DAILY TREATMENT NOTE    Diagnosis:   1. Multiple sclerosis (CMS Dx)             Referring Provider:Zabeti, Aram, MD    Insurance plan:   Payor/Plan Subscr DOB Sex Relation Sub. Ins. ID Effective Group Num   1. Melanie Graven MANAGE* [name], Melanie [name] 03/23/1956 Female  [id number] 02/[birth year]/18 [id number]                                   [address] [address]                         # of visits per insurance authorization: BOMN with $40 co-pay, pt applied for financial assistance  # of visits per POC: up to 8    Date of Initial Eval: 10/13/18    Date of last POC:10/13/18    Goals of Therapy:     Goals of Therapy:     Short Term Goals: time frame 4 wks status   Pt will demonstrate/show compliance with a home exercise program based on therapy recommendations to maintain benefits gained in physical therapy.   Pt without updated HEP    To include   - B hip/trunk stretching (HS, piriformis. Lateral flexors);  - STM options  - proper sitting/posture/body mechanics     Pt will have only 1-2 episodes of LBP in a day episodes of pain come and go thru out the day   Pt will report a reduction in pain,  </= to 2/10 Pain ranges 4-5/10    Pt will improve oswestry score by 15% from baseline TBD   Pt to ambulate with out AD for 250 feet with minimal trendelenberg noted especially on the L and minimal pain Pt amb 20-40 feet at a time with no AD with increased left hip drop and decreased R foot clearance. Pt usually walks in community with RW.        ______________________________________________________________________  Pain:   /10         Location:                             Precautions: Multiple Sclerosis, DON'T stand on soft foam (creates shooting pain in BLEs)  Visual impairment with Reading    Activity Date: 10/13/18 Date: 10/20/18 Date: 10/27/18 Date: 11/06/18    Visit # 1  2  3  4    Checked for POC Certification Sent to MD        Verified Signed POC:    YES[x]       NO[]            Date:    Cosigned by: Raeanne Gathers, MD at 10/13/2018 ??9:22  AM          Rerouted to Referral Source for signature via: EMR[x]   Fax[]    Mail[]       Date:  Routed to PCP (if other than referring) for signature via: EMR[]   Fax[]     Mail[]      Date:  Was POC updated?        YES[]         NO[]                            Date:        If yes, Verified Signed POC:  YES[]             NO[]            Date:      SUBJECTIVE:  Pt reports no LOB or falls since her last visit.  Her R hip still has overall tightness with occasional popping when she lays for extended periods on her back.        OBJECTIVE: see table  CPT code  Daily treatment record Timed Coded Treatment Minutes   PT Moderate Complexity Eval: 57846     Therapeutic Exercise: 97110 SciFit Lvl 1.0 BUE BLE  12   Therapeutic Activities: 97530     Manual Therapy: 97140  R Ant hip mobs, lumbar opening   PROM hs, glute, piri s. 2h30  20    Neuro RE-ed:  Tandem stands, SLS, SLS cone taps, 6 inch step ups  13    Ice/Heat  Hot pack R hip,     Response to Treatment  Pt tol treatment well.  Pt reports decreased pain and tightness post therapy.  Pt demo's less L hip drop on exit.     Total Treatment TIme:    45 mins     Plan:   (1)  HEP w/LARGE PRINT;       - B hip/trunk stretches (B hip/trunk stretching [HS, piriformis, lateral flexors];      - STM options      - proper sitting/posture/body mechanics   (2) reduce radicular sx  (3) balance activities and hip strengthning  (4) gait with out AD as tolerated    Additional Information:     Patient Education:

## 2018-11-06 NOTE — Unmapped (Signed)
Occupational Therapy Treatment Encounter Note  Name: Melanie [name]  Date of Birth: [birth year]  MRN: [id number]    Referring Physician: Raeanne Gathers, MD  [address]  Neurology  [place], [place] [address] Phone: [phone]  Fax: [phone]    Primary Medical Diagnosis: 799.89 (ICD-9-CM) - Z74.09 (ICD-10-CM) - Impaired mobility and ADLs  Treatment Diagnosis: No diagnosis found.     Insurance: Payor: HUMANA MANAGED MEDICARE / Plan: HUMANA GOLD PLUS MEDICARE / Product Type: Medicare Mngd Care /   10/06/2018//TDC IV/APS/[phone]/BENEFIT AUTH INFO  PT OT SP- PT. HAS ??HUMANA MEDICARE GOLD PLUS HMO INS PLAN  EFF DATE ????3.1.19  DRAKE IS IN NETWORK   PT/OT/ST GETS UNLIMITED V BOMN PCY  NO PC REQ  MODALITY LIMIT? BOMN  DED: $0 ????????MET: $ ????FAM $0 ????MET $  OOP: $3400 ??????MET: $637.58 ????FAM $0 ????MET $  COPAY: $40 COPAY PER DAY, ??????CO-INS: ??????100%  DOES COPAY APPLY TO OOP? ??YES  NO PRE-EXISTING  NO YRLY OR LIFETIME MAX   PER ??????JAMES ????????????  REF # ??[id number]  PH # ??[phone],   ??  AQUATIC THERAPY IS A COVERED SERVICE,  IF MEDICARE PAYS PLAN WILL PAY,  THERAPIST MUST PROVE MEDICAL NECESSITY   IF PLAN DENIES PT WILL BE RESPONSIBLE  # of visits per insurance authorization: BOMN with $40 co-pay, pt applied for financial assistance  # of visits per POC: 12     Activity Date: 11/06/2018 Date:  Date: Date: Date:    Visit # 4       Checked for POC Certification          Date of Initial Evaluation: 10/13/2018  Verified Signed POC:  [x]  yes; Date: 10/13/2018    []  No  Date of Last Updated Plan of Care:   Reassessment Due:   Rerouted to Referral Source for signature via:  []  EMR  []  FAX  []  MAIL;   DATE:   Routed to PCP (if other than referring MD) for signature via:   []  EMR    []  FAX   []  MAIL  ;DATE:   Was POC updated?        YES[]         NO[]                            Date:        If yes, Verified Signed POC:      YES[]             NO[]            Date:      PRECAUTIONS/CONTRAINDICATION: MS, balance deficits    Objective:      Pain: 0/10      Location:      Subjective:                      Since the last visit patient reports the following answers to the below questions  1. Have you fallen since your last visit? No  If yes where and what were you doing?  2.   Any change in your medical condition since last visit?  3.   Were you seen in the ED or admitted to the hospital?    Therapy Goal:     Short Term Goals to be met within 3 weeks  INITIAL STATUS REASSESSMENT:  DATE:   1. Patient will demonstrate independence with progressive HEP for UE Strengthening and fine motor coordination to  address functional deficits with continued progress after discharge.    ?? Lack Strengthening program  [] ? PROGRESS MADE/        CONTINUE GOAL  [] ? GOAL MET  [] ? DC GOAL   Long Term Goals to be met within 6 weeks  INITIAL STATUS REASSESSMENT:   1. Patient will identify energy conservation techniques with functional home making activities; and Independently identify     Lack knowledge of energy techniques  [] ? PROGRESS MADE/        CONTINUE GOAL  [] ? GOAL MET  [] ? DC GOAL  ??   2.Patient will participate in further cognitive testing.  Expresses problems with memory  [] ? PROGRESS MADE/        CONTINUE GOAL  [] ? GOAL MET  [] ? DC GOAL  ??   3. Patient will identify ways to decrease changes of falls at home  Reports hx of falls at home  [] ? PROGRESS MADE/        CONTINUE GOAL  [] ? GOAL MET  [] ? DC GOAL  ??   4. ?? [] ? PROGRESS MADE/        CONTINUE GOAL  [] ? GOAL MET  [] ? DC GOAL  ??         CPT code Skilled interventions, Assessment, and Education Timed code treatment minutes   therapeutic exercise Instructed patient to complete B UE exercise to improve activity tolerance and functional strength for ADLs and IADLs - using rickshaw for B elbow extension for 3 x 10 reps at 20 lb, verbal cues for postural alignment; in standing patient performed shoulder retraction for 2 x 10 reps and trunk rotation for 2 x 10 reps with verbal cues for correct techniques.  12 min    therapeutic activity BITS - The [employer] Integrated Therapy System, BITS, is intended to challenge and assess the physical, visual, auditory, and cognitive abilities of an individual. BITS??? interactive 55 touchscreen and diverse program options challenge patients through the use of visual motor activities, visual and auditory processing, cognitive skills and endurance training.    Memory Program  The Memory program is designed to challenge and assess speed of recognition, visual memory, and auditory memory, while providing cognitive training. Target stimuli are presented verbally and/or visually on the display screen. The user must remember the presented sequence, locate the correct images, letters, numbers, or words, and touch them according to the correct sequence.  1st trial: 1 min, sequence 4 with 100% and 8 successful trials  2nd trial: 1 min, sequence 5 with 100% and 7 successful trials  3rd trial: 1 min, sequence 6 with 100% and 7  successful trials.    Reviewed energy conservation techniques with patient; patient reported that she did not have the chance to read through handout yet. Educated patient on ideas to modified daily schedule to reduce needs for long duration of rest breaks; patient verbalized understanding.     Instructed patient to complete dynamic standing balance activity to challenge balance control for ADLs and IADLs - in standing, patient threw red weighted ball against trampoline for 10 reps with CGA; challenged patient by progressing to throwing and forward stepping for 2 x 10 reps each LE with CGA for weight shifting and postural adjustment.  28 min   self-care/home management   min        Start: 1145  Stop:  1225  Total treatment time: 40     Additional Information: Patient initially stated that she was very tired due to waking up  early in the morning to take kids to school, but reported having a lot of energy during the treatment.      Patient Education:     Education to: [x]  Patient  [] Significant other   []  Caregiver []  other   Method: [x]  Explanation []  Demonstration   []  Handouts []  other   Response: [x]  Verbalized understanding []  Demonstrated understanding []  Needs reinforcement []  other       NEXT TREATMENT SESSION: to assess needs for continuation of skilled OT interventions as patient has made good progress in established goals.     This note serves as a discharge summary if the patient does not return to OCCUPATIONAL THERAPY per the above POC.       Therapist Signature: CHANG-TUNG Theadora Noyes, OTR/L  Date: 11/06/2018

## 2018-11-17 ENCOUNTER — Encounter: Admit: 2018-11-17 | Discharge: 2018-11-17 | Payer: Medicare (Managed Care)

## 2018-11-17 DIAGNOSIS — G35 Multiple sclerosis: Secondary | ICD-10-CM

## 2018-11-17 NOTE — Unmapped (Signed)
PT DAILY TREATMENT NOTE    Diagnosis:   1. Multiple sclerosis (CMS Dx)     2. Impaired mobility and activities of daily living             Referring Provider:Zabeti, Aram, MD    Insurance plan:   Payor/Plan Subscr DOB Sex Relation Sub. Ins. ID Effective Group Num   1. Melanie [name] MANAGE* NYARI, OLSSON [birth year] Female  [id number] 02/[birth year] [id number]                                   [address] [address]                         # of visits per insurance authorization: BOMN with $40 co-pay, pt applied for financial assistance  # of visits per POC: up to 8    Date of Initial Eval: 10/13/18    Date of last POC:10/13/18    Goals of Therapy:     Goals of Therapy:     Short Term Goals: time frame 4 wks status   Pt will demonstrate/show compliance with a home exercise program based on therapy recommendations to maintain benefits gained in physical therapy.   Pt without updated HEP    To include   - B hip/trunk stretching (HS, piriformis. Lateral flexors);  - STM options  - proper sitting/posture/body mechanics     Pt will have only 1-2 episodes of LBP in a day episodes of pain come and go thru out the day   Pt will report a reduction in pain,  </= to 2/10 Pain ranges 4-5/10    Pt will improve oswestry score by 15% from baseline TBD   Pt to ambulate with out AD for 250 feet with minimal trendelenberg noted especially on the L and minimal pain Pt amb 20-40 feet at a time with no AD with increased left hip drop and decreased R foot clearance. Pt usually walks in community with RW.        ______________________________________________________________________  Pain:   /10         Location:                             Precautions: Multiple Sclerosis, DON'T stand on soft foam (creates shooting pain in BLEs)  Visual impairment with Reading    Activity Date: 10/13/18 Date: 10/20/18 Date: 10/27/18 Date: 11/06/18    Visit # 1  2  3  4    Checked for POC Certification Sent to MD        Verified Signed POC:    YES[x]       NO[]             Date:    Cosigned by: Raeanne Gathers, MD at 10/13/2018 ??9:22 AM      Rerouted to Referral Source for signature via: EMR[x]   Fax[]    Mail[]       Date:  Routed to PCP (if other than referring) for signature via: EMR[]   Fax[]     Mail[]      Date:  Was POC updated?        YES[]         NO[]                            Date:  If yes, Verified Signed POC:      YES[]             NO[]            Date:      SUBJECTIVE:  11/22:  Pt reports no LOB or falls since last visit.  She is still having occasional hip twinge in R if she moves her left leg in certain ways (cannot replicate today).  Stated she has been sick with flu-like symptoms the past week, but is feeling improved.  Has not been doing her HEP.  Reported she doesn't like to stand too much.      11/11: Pt reports no LOB or falls since her last visit.  Her R hip still has overall tightness with occasional popping when she lays for extended periods on her back.        OBJECTIVE: see table  CPT code  Daily treatment record Timed Coded Treatment Minutes   PT Moderate Complexity Eval: 16109     Therapeutic Exercise: 97110 SciFit cycle Lvl 1.0, BLE,  In // bars:   - hip 3way, red tb, x12 ea  - standing marches x12, heel raises x15, toe raises x15, mini squats x15  25   Therapeutic Activities: 97530     Manual Therapy: 97140       Neuro Re-ed:  Tandem stands, SLS, SLS cone taps, 6 inch step ups  15    Ice/Heat  Hot pack R hip,     Response to Treatment  Pt tol treatment well.  Fatigues quickly and requires frequent rest breaks.  Pt reports decreased pain and tightness post therapy.  Pt demo's less L hip drop on exit.     Total Treatment TIme:    40 mins     Plan:   (1)  HEP w/LARGE PRINT;       - B hip/trunk stretches (B hip/trunk stretching [HS, piriformis, lateral flexors];      - STM options      - proper sitting/posture/body mechanics   (2) reduce radicular sx  (3) balance activities and hip strengthning  (4) gait with out AD as tolerated    Additional  Information:     Patient Education:

## 2018-11-17 NOTE — Unmapped (Signed)
Occupational Therapy Treatment Encounter Note/Discharge summary  Name: Melanie [name]  Date of Birth: [birth year]  MRN: [id number]    Referring Physician: Raeanne Gathers, MD  [address]  Neurology  [place], [place] [address] Phone: [phone]  Fax: [phone]    Primary Medical Diagnosis: 799.89 (ICD-9-CM) - Z74.09 (ICD-10-CM) - Impaired mobility and ADLs  Treatment Diagnosis:   1. Multiple sclerosis (CMS Dx)     2. Weakness generalized          Insurance: Payor: HUMANA MANAGED MEDICARE / Plan: HUMANA GOLD PLUS MEDICARE / Product Type: Medicare Mngd Care /   10/06/2018//TDC IV/[phone]/BENEFIT AUTH INFO  PT OT SP- PT. HAS ??HUMANA MEDICARE GOLD PLUS HMO INS PLAN  EFF DATE ????3.1.19  DRAKE IS IN NETWORK   PT/OT/ST GETS UNLIMITED V BOMN PCY  NO PC REQ  MODALITY LIMIT? BOMN  DED: $0 ????????MET: $ ????FAM $0 ????MET $  OOP: $3400 ??????MET: $637.58 ????FAM $0 ????MET $  COPAY: $40 COPAY PER DAY, ??????CO-INS: ??????100%  DOES COPAY APPLY TO OOP? ??YES  NO PRE-EXISTING  NO YRLY OR LIFETIME MAX   PER ??????JAMES ????????????  REF # ??[id number]  PH # ??[phone],   ??  AQUATIC THERAPY IS A COVERED SERVICE,  IF MEDICARE PAYS PLAN WILL PAY,  THERAPIST MUST PROVE MEDICAL NECESSITY   IF PLAN DENIES PT WILL BE RESPONSIBLE  # of visits per insurance authorization: BOMN with $40 co-pay, pt applied for financial assistance  # of visits per POC: 12     Activity Date: 11/06/2018 Date:   11/17/2018 Date: Date: Date:    Visit # 4 5      Checked for POC Certification          Date of Initial Evaluation: 10/13/2018  Verified Signed POC:  [x]  yes; Date: 10/13/2018    []  No  Date of Last Updated Plan of Care:   Reassessment Due:   Rerouted to Referral Source for signature via:  []  EMR  []  FAX  []  MAIL;   DATE:   Routed to PCP (if other than referring MD) for signature via:   []  EMR    []  FAX   []  MAIL  ;DATE:   Was POC updated?        YES[]         NO[]                            Date:        If yes, Verified Signed POC:      YES[]             NO[]             Date:      PRECAUTIONS/CONTRAINDICATION: MS, balance deficits    Objective:     Pain: 0/10        Subjective:                      Since the last visit patient reports the following answers to the below questions  1. Have you fallen since your last visit? No  If yes where and what were you doing? No  2.   Any change in your medical condition since last visit? Patient stated that she had a flu, but is feeling better now.   3.   Were you seen in the ED or admitted to the hospital? No    Therapy Goal:     Short Term Goals  to be met within 3 weeks  INITIAL STATUS REASSESSMENT:  DATE: DISCHARGE STATUS   1. Patient will demonstrate independence with progressive HEP for UE Strengthening and fine motor coordination to address functional deficits with continued progress after discharge.    ?? Lack Strengthening program  [] ? PROGRESS MADE/        CONTINUE GOAL  [x] ? GOAL MET  [x] ? DC GOAL HEP has been established and patient reported ability to perform programs at home.    Long Term Goals to be met within 6 weeks  INITIAL STATUS REASSESSMENT: DISCHARGE STATUS   1. Patient will identify energy conservation techniques with functional home making activities; and Independently identify     Lack knowledge of energy techniques  [] ? PROGRESS MADE/        CONTINUE GOAL  [x] ? GOAL MET  [x] ? DC GOAL  ?? Patient demonstrates understanding and ability to implement energy conservation techniques in IADLs   2.Patient will participate in further cognitive testing.  Expresses problems with memory  [] ? PROGRESS MADE/        CONTINUE GOAL  [x] ? GOAL MET  [x] ? DC GOAL  ?? Patient demonstrates slight memory deficits in working memory but overall WFL.    3. Patient will identify ways to decrease changes of falls at home  Reports hx of falls at home  [] ? PROGRESS MADE/        CONTINUE GOAL  [x] ? GOAL MET  [x] ? DC GOAL  ?? Patient has reported no fall and is able to identify fall risk in home environment.    4. ?? [] ? PROGRESS MADE/        CONTINUE  GOAL  [] ? GOAL MET  [] ? DC GOAL  ??          CPT code Skilled interventions, Assessment, and Education Timed code treatment minutes   therapeutic exercise Provision of yellow theraputty and instruction for grip strengthening and finger dexterity training; patient demonstrated ability to carry out HEP.  8 min   therapeutic activity Instructed patient to engage in memory program utilizing worksheets from [employer] to challenge patient's working memory: patient's overall performance was [facility] with slight difficulty in shapes and colors; however, patient has reported compliance with home program incorporating memory games. Reviewed energy conservation techniques and patient demonstrated understanding and ability to independently apply into daily routines.  22 min   self-care/home management          Start: 1104  Stop:  1134  Total treatment time: 30        Patient Education:     Education to: [x]  Patient [] Significant other   []  Caregiver []  other   Method: [x]  Explanation []  Demonstration   []  Handouts []  other   Response: [x]  Verbalized understanding []  Demonstrated understanding []  Needs reinforcement []  other       Discharge Summary: Patient has achieved all established goals. Therefore, ongoing skilled OT interventions are no longer warranted. Patient is to continue HEP for strengthening, endurance, and memory.         Therapist Signature: CHANG-TUNG Edem Tiegs, OTR/L  Date: 11/17/2018

## 2018-11-20 ENCOUNTER — Encounter: Admit: 2018-11-20 | Discharge: 2018-11-20 | Payer: Medicare (Managed Care)

## 2018-11-20 DIAGNOSIS — G35 Multiple sclerosis: Secondary | ICD-10-CM

## 2018-11-20 NOTE — Unmapped (Signed)
PT DAILY TREATMENT NOTE    Diagnosis:   1. Multiple sclerosis (CMS Dx)     2. Impaired mobility and activities of daily living             Referring Provider:Zabeti, Aram, MD    Insurance plan:   Payor/Plan Subscr DOB Sex Relation Sub. Ins. ID Effective Group Num   1. Melanie [name] MANAGE* CHESNI, VOS January 22, 1956 Female  [id number] 02/24/18 [id number]                                   [address] [address]                         # of visits per insurance authorization: BOMN with $40 co-pay, pt applied for financial assistance  # of visits per POC: up to 8    Date of Initial Eval: 10/13/18    Date of last POC:10/13/18    Goals of Therapy:     Goals of Therapy:     Short Term Goals: time frame 4 wks status   Pt will demonstrate/show compliance with a home exercise program based on therapy recommendations to maintain benefits gained in physical therapy.   Pt without updated HEP    To include   - B hip/trunk stretching (HS, piriformis. Lateral flexors);  - STM options  - proper sitting/posture/body mechanics     Pt will have only 1-2 episodes of LBP in a day episodes of pain come and go thru out the day   Pt will report a reduction in pain,  </= to 2/10 Pain ranges 4-5/10    Pt will improve oswestry score by 15% from baseline TBD   Pt to ambulate with out AD for 250 feet with minimal trendelenberg noted especially on the L and minimal pain Pt amb 20-40 feet at a time with no AD with increased left hip drop and decreased R foot clearance. Pt usually walks in community with RW.        ______________________________________________________________________  Pain:   /10         Location:                             Precautions: Multiple Sclerosis, DON'T stand on soft foam (creates shooting pain in BLEs)  Visual impairment with Reading    Activity Date: 10/13/18 Date: 10/20/18 Date: 10/27/18 Date: 11/06/18 11/17/18 11/20/18    Visit # 1  2  3  4  5  6    Checked for POC Certification Sent to MD           Verified Signed POC:     YES[x]       NO[]            Date:    Cosigned by: Raeanne Gathers, MD at 10/13/2018 ??9:22 AM      Rerouted to Referral Source for signature via: EMR[x]   Fax[]    Mail[]       Date:  Routed to PCP (if other than referring) for signature via: EMR[]   Fax[]     Mail[]      Date:  Was POC updated?        YES[]         NO[]   Date:        If yes, Verified Signed POC:      YES[]             NO[]            Date:      SUBJECTIVE:  11/25:  Pt reports having a busy weekend, but does not feel worn out.  Flu symptoms seem to be gone, but still has a residual cough.    11/22:  Pt reports no LOB or falls since last visit.  She is still having occasional hip twinge in R if she moves her left leg in certain ways (cannot replicate today).  Stated she has been sick with flu-like symptoms the past week, but is feeling improved.  Has not been doing her HEP.  Reported she doesn't like to stand too much.    11/11: Pt reports no LOB or falls since her last visit.  Her R hip still has overall tightness with occasional popping when she lays for extended periods on her back.        OBJECTIVE: see table  CPT code  Daily treatment record Timed Coded Treatment Minutes   PT Moderate Complexity Eval: 11914     Therapeutic Exercise: 97110 SciFit cycle Lvl 1.0, BLE,  Supine on mat: KTC, piri, hs PROM   bridges x20, SLR x10 ea    25   Therapeutic Activities: 97530     Manual Therapy: 97140       Neuro Re-ed:  Tandem stands, SLS, SLS cone taps, 6 inch step ups  15    Ice/Heat  Hot pack R hip,     Response to Treatment  Pt tol treatment well.  Fatigues quickly and requires frequent rest breaks.  Pt reports decreased pain and tightness post therapy.  Pt demo's less L hip drop on exit.     Total Treatment TIme:    40 mins     Plan:   (1)  HEP w/LARGE PRINT;       - B hip/trunk stretches (B hip/trunk stretching [HS, piriformis, lateral flexors];      - STM options      - proper sitting/posture/body mechanics   (2) reduce  radicular sx  (3) balance activities and hip strengthning  (4) gait with out AD as tolerated    Additional Information:     Patient Education:

## 2018-11-29 ENCOUNTER — Encounter: Admit: 2018-11-29 | Discharge: 2018-11-29 | Payer: Medicare (Managed Care)

## 2018-11-29 DIAGNOSIS — R269 Unspecified abnormalities of gait and mobility: Secondary | ICD-10-CM

## 2018-11-29 NOTE — Unmapped (Signed)
Page Spiro had PT today and C/o hip and pelvic pain   Pain present x 2 days , pain level 7/10 causing difficulty with gait   Patient wants to know if MD will send RX for muscle relaxer's to [facility] pharmacy on file     Patient requesting call back at [phone] (H)

## 2018-11-29 NOTE — Unmapped (Signed)
Spoke with Melanie [name] regarding how she is feeling.    1. When did it start? Cold weather irritating her body/legs    2. Better, worse or same? worse    3. Have you had similar or same symptoms before? Legs are giving out lately. Recently had flu so now patient is back and she needs to build strength bursitis    4. If so, are symptoms worse than they were before? Same     5. How severe are the symptoms? Severe with therapy    6. How do they impact your function? Just asking for relaxer to pain     7. Any infectious symptoms- change in urinary frequency, urgency, color or odor in your urine? No Fever, No urine issues    8. For headaches- any associated symptoms - change in vision, nausea, vomiting, rhinorrhea, watery eyes? N/A

## 2018-11-29 NOTE — Unmapped (Signed)
PT DAILY TREATMENT NOTE    Diagnosis:   1. Multiple sclerosis (CMS Dx)             Referring Provider:Zabeti, Aram, MD    Insurance plan:   Payor/Plan Subscr DOB Sex Relation Sub. Ins. ID Effective Group Num   1. Melanie Graven MANAGE* [name], Melanie [name] 1956/09/02 Female  [id number] 02/24/18 [id number]                                   [address] [address]                         # of visits per insurance authorization: BOMN with $40 co-pay, pt applied for financial assistance  # of visits per POC: up to 8    Date of Initial Eval: 10/13/18    Date of last POC:10/13/18    Goals of Therapy:     Goals of Therapy:     Short Term Goals: time frame 4 wks status   Pt will demonstrate/show compliance with a home exercise program based on therapy recommendations to maintain benefits gained in physical therapy.   Pt without updated HEP    To include   - B hip/trunk stretching (HS, piriformis. Lateral flexors);  - STM options  - proper sitting/posture/body mechanics     Pt will have only 1-2 episodes of LBP in a day episodes of pain come and go thru out the day   Pt will report a reduction in pain,  </= to 2/10 Pain ranges 4-5/10    Pt will improve oswestry score by 15% from baseline TBD   Pt to ambulate with out AD for 250 feet with minimal trendelenberg noted especially on the L and minimal pain Pt amb 20-40 feet at a time with no AD with increased left hip drop and decreased R foot clearance. Pt usually walks in community with RW.        ______________________________________________________________________  Pain:   /10         Location:                             Precautions: Multiple Sclerosis, DON'T stand on soft foam (creates shooting pain in BLEs)  Visual impairment with Reading    Activity Date: 10/13/18 Date: 10/20/18 Date: 10/27/18 Date: 11/06/18 11/17/18 11/20/18 11/29/18    Visit # 1  2  3  4  5  6  7    Checked for POC Certification Sent to MD            Verified Signed POC:    YES[x]       NO[]            Date:    Cosigned  by: Melanie Gathers, MD at 10/13/2018 ??9:22 AM      Rerouted to Referral Source for signature via: EMR[x]   Fax[]    Mail[]       Date:  Routed to PCP (if other than referring) for signature via: EMR[]   Fax[]     Mail[]      Date:  Was POC updated?        YES[]         NO[]                            Date:  If yes, Verified Signed POC:      YES[]             NO[]            Date:      SUBJECTIVE:  12/4:  Pt reports body isn't feeling as well since it got cold out. R hip feels stiff.     11/25:  Pt reports having a busy weekend, but does not feel worn out.  Flu symptoms seem to be gone, but still has a residual cough.  11/22:  Pt reports no LOB or falls since last visit.  She is still having occasional hip twinge in R if she moves her left leg in certain ways (cannot replicate today).  Stated she has been sick with flu-like symptoms the past week, but is feeling improved.  Has not been doing her HEP.  Reported she doesn't like to stand too much.    11/11: Pt reports no LOB or falls since her last visit.  Her R hip still has overall tightness with occasional popping when she lays for extended periods on her back.        OBJECTIVE: see table  CPT code  Daily treatment record Timed Coded Treatment Minutes   PT Moderate Complexity Eval: 19147     Therapeutic Exercise: 97110 SciFit cycle Lvl 3.0, BLE,  Supine on mat: KTC, piri, hs PROM   bridges 2x10, SLR 4x5 ea    25   Therapeutic Activities: 97530     Manual Therapy: 97140       Neuro Re-ed:  Tandem stands, SLS, SLS cone taps, 6 inch step ups  15   Gait training:    Pt amb 220 feet, no AD, SBA.  8    Ice/Heat  Hot pack R hip,     Response to Treatment  Pt tol treatment well.  During ambulation, pt had what she described as hip buckling 2x, req'ing her sto have to take standing rest break.  Fritzi Mandes eval'd situation and determined bursitits flare up which pt typically has in R hip.    Fatigues quickly and requires frequent rest breaks.  Pt reports  decreased pain and tightness post therapy.  Pt demo's less L hip drop on exit.     Total Treatment TIme:    48 mins     Plan:   (1)  HEP w/LARGE PRINT;       - B hip/trunk stretches (B hip/trunk stretching [HS, piriformis, lateral flexors];      - STM options      - proper sitting/posture/body mechanics   (2) reduce radicular sx  (3) balance activities and hip strengthning  (4) gait with out AD as tolerated    Additional Information:     Patient Education:

## 2018-11-29 NOTE — Telephone Encounter (Signed)
Need to verify Baclofen and Lyrica dose she takes and if she needs refill for those?

## 2018-11-30 NOTE — Telephone Encounter (Signed)
Baclofen is a muscle relaxant and she can take 10-20 mg extra dose per day as needed. After few weeks of trial she should call us with result and let us know what dose works better for her then I will send a new Rx to her pharmacy.

## 2018-11-30 NOTE — Telephone Encounter (Signed)
Spoke with Melanie [name] regarding her Baclofen 10 mg TID and Lyrica 100 mg (patient stated 150 mg to strong she could not function). Patient is requesting a PRN muscle relaxer to get her over this period. She does not want a daily dose.

## 2018-11-30 NOTE — Telephone Encounter (Signed)
Used My Chart message to let patient know of MD's orders to increase her Baclofen dose.

## 2018-12-01 ENCOUNTER — Encounter: Admit: 2018-12-01 | Discharge: 2018-12-01 | Payer: Medicare (Managed Care)

## 2018-12-01 DIAGNOSIS — G35 Multiple sclerosis: Secondary | ICD-10-CM

## 2018-12-01 NOTE — Unmapped (Signed)
PT DAILY TREATMENT NOTE    Diagnosis:   1. Multiple sclerosis (CMS Dx)     2. Impaired mobility and activities of daily living             Referring Provider:Zabeti, Aram, MD    Insurance plan:   Payor/Plan Subscr DOB Sex Relation Sub. Ins. ID Effective Group Num   1. Melanie [name] MANAGE* Melanie [name], Melanie [name] 1956-06-21 Female  [id number] 02/24/18 [id number]                                   [address] [address]                         # of visits per insurance authorization: BOMN with $40 co-pay, pt applied for financial assistance  # of visits per POC: up to 8    Date of Initial Eval: 10/13/18    Date of last POC:10/13/18    Goals of Therapy:     Goals of Therapy:     Short Term Goals: time frame 4 wks status   Pt will demonstrate/show compliance with a home exercise program based on therapy recommendations to maintain benefits gained in physical therapy.   Pt without updated HEP    To include   - B hip/trunk stretching (HS, piriformis. Lateral flexors);  - STM options  - proper sitting/posture/body mechanics     Pt will have only 1-2 episodes of LBP in a day episodes of pain come and go thru out the day   Pt will report a reduction in pain,  </= to 2/10 Pain ranges 4-5/10    Pt will improve oswestry score by 15% from baseline TBD   Pt to ambulate with out AD for 250 feet with minimal trendelenberg noted especially on the L and minimal pain Pt amb 20-40 feet at a time with no AD with increased left hip drop and decreased R foot clearance. Pt usually walks in community with RW.        ______________________________________________________________________  Pain:   /10         Location:                             Precautions: Multiple Sclerosis, DON'T stand on soft foam (creates shooting pain in BLEs)  Visual impairment with Reading    Activity Date: 10/13/18 Date: 10/20/18 Date: 10/27/18 Date: 11/06/18 11/17/18 11/20/18 11/29/18 12/01/18    Visit # 1  2  3  4  5  6  7  8    Checked for POC Certification Sent to MD              Verified Signed POC:    YES[x]       NO[]            Date:    Cosigned by: Raeanne Gathers, MD at 10/13/2018 ??9:22 AM      Rerouted to Referral Source for signature via: EMR[x]   Fax[]    Mail[]       Date:  Routed to PCP (if other than referring) for signature via: EMR[]   Fax[]     Mail[]      Date:  Was POC updated?        YES[]         NO[]   Date:        If yes, Verified Signed POC:      YES[]             NO[]            Date:      SUBJECTIVE:  12/6: Pt arrived 16 minutes late, agreeable to shortened session.  Pt iced hip at home with improved results, decreased pain.  Feels like she has been walking better.       12/4:  Pt reports body isn't feeling as well since it got cold out. R hip feels stiff.   11/25:  Pt reports having a busy weekend, but does not feel worn out.  Flu symptoms seem to be gone, but still has a residual cough.  11/22:  Pt reports no LOB or falls since last visit.  She is still having occasional hip twinge in R if she moves her left leg in certain ways (cannot replicate today).  Stated she has been sick with flu-like symptoms the past week, but is feeling improved.  Has not been doing her HEP.  Reported she doesn't like to stand too much.    11/11: Pt reports no LOB or falls since her last visit.  Her R hip still has overall tightness with occasional popping when she lays for extended periods on her back.        OBJECTIVE: see table  CPT code  Daily treatment record Timed Coded Treatment Minutes   PT Moderate Complexity Eval: 16109     Therapeutic Exercise: 97110 SciFit cycle Lvl 3.0, BLE,  Supine on mat: KTC, piri, hs PROM   bridges 2x10, SLR 4x5 ea    25   Therapeutic Activities: 97530     Manual Therapy: 97140       Neuro Re-ed:  Tandem stands, SLS, SLS cone taps, 6 inch step ups    Gait training:    Pt amb 220 feet, no AD, SBA.     Ice/Heat  Hot pack R hip,     Response to Treatment  Pt tol treatment well.  Walking improved with decreased hip pain.     Fatigues quickly and requires frequent rest breaks.  Pt reports decreased pain and tightness post therapy.  Pt demo's less L hip drop on exit.     Total Treatment TIme:    33 mins     Plan:   (1)  HEP w/LARGE PRINT;       - B hip/trunk stretches (B hip/trunk stretching [HS, piriformis, lateral flexors];      - STM options      - proper sitting/posture/body mechanics   (2) reduce radicular sx  (3) balance activities and hip strengthning  (4) gait with out AD as tolerated    Additional Information:     Patient Education:

## 2018-12-06 ENCOUNTER — Encounter: Admit: 2018-12-06 | Discharge: 2018-12-06 | Payer: Medicare (Managed Care)

## 2018-12-06 DIAGNOSIS — G35 Multiple sclerosis: Secondary | ICD-10-CM

## 2018-12-06 NOTE — Unmapped (Signed)
PT DAILY TREATMENT NOTE//PROGRESS NOTE//DC SUMMARY      Diagnosis:   1. Multiple sclerosis (CMS Dx)     2. Impaired mobility and activities of daily living     3. Pain in both lower extremities     4. Radiculopathy of lumbosacral region             Referring Provider:Zabeti, Aram, MD    Insurance plan:   Payor/Plan Subscr DOB Sex Relation Sub. Ins. ID Effective Group Num   1. YURIDIANA, FORMANEK [birth year] Female  [id number] 02/[birth year]/18 [id number]                                   [address] [address]                         # of visits per insurance authorization: BOMN with $40 co-pay, pt applied for financial assistance  # of visits per POC: up to 8    Date of Initial Eval: 10/13/18    Date of last POC:10/13/18, 12/06/18    Goals of Therapy:     Goals of Therapy:     Short Term Goals: time frame 4 wks status Re-eval 12/06/18   Pt will demonstrate/show compliance with a home exercise program based on therapy recommendations to maintain benefits gained in physical therapy.   Pt without updated HEP    To include   - B hip/trunk stretching (HS, piriformis. Lateral flexors);  - STM options  - proper sitting/posture/body mechanics   Pt performing HEP at home , reports she feels better, back pain is at 3-4/10      GOAL MET   Pt will have only 1-2 episodes of LBP in a day episodes of pain come and go thru out the day Usually below 5 times a day - reports it is intermittent   Pt will report a reduction in pain,  </= to 2/10 Pain ranges 4-5/10  3/10 LBP    GOAL MET   Pt will improve oswestry score by 15% from baseline TBD Baseline not taken - new baseline is 46% on Oswestry pain disability indep.  WE now have a baseline for the future.        Pt to ambulate with out AD for 250 feet with minimal trendelenberg noted especially on the L and minimal pain Pt amb 20-40 feet at a time with no AD with increased left hip drop and decreased R foot clearance. Pt usually walks in community with RW.  Pt ambulatory longer  distances with SC but today did not arrive with AD at this time.  Reports walking is easier she is having minimal lateral sway and trendelenburg bilat.     GOAL MET       ______________________________________________________________________  Pain:  3 /10         Location:    Low back.                           Precautions: Multiple Sclerosis, DON'T stand on soft foam (creates shooting pain in BLEs)  Visual impairment with Reading    Activity Date: 10/13/18 Date: 10/20/18 Date: 10/27/18 Date: 11/06/18 11/17/18 11/20/18 11/29/18 12/01/18 12/06/18    Visit # 1  2  3  4  5  6  7  8 9    Checked for POC Certification Sent  to MD         Re-eval     Verified Signed POC:    YES[x]       NO[]            Date:    Cosigned by: Raeanne Gathers, MD at 10/13/2018 ??9:22 AM      Rerouted to Referral Source for signature via: EMR[x]   Fax[]    Mail[]       Date:  Routed to PCP (if other than referring) for signature via: EMR[]   Fax[]     Mail[]      Date:  Was POC updated?        YES[]         NO[]                            Date:        If yes, Verified Signed POC:      YES[]             NO[]            Date:      SUBJECTIVE:    12/06/18: Pt ambulatory with out AD today - reports pain is less and she is walking more and more with out cane.   12/6: Pt arrived 16 minutes late, agreeable to shortened session.  Pt iced hip at home with improved results, decreased pain.  Feels like she has been walking better.       12/4:  Pt reports body isn't feeling as well since it got cold out. R hip feels stiff.   11/25:  Pt reports having a busy weekend, but does not feel worn out.  Flu symptoms seem to be gone, but still has a residual cough.  11/22:  Pt reports no LOB or falls since last visit.  She is still having occasional hip twinge in R if she moves her left leg in certain ways (cannot replicate today).  Stated she has been sick with flu-like symptoms the past week, but is feeling improved.  Has not been doing her HEP.  Reported she doesn't like to stand  too much.    11/11: Pt reports no LOB or falls since her last visit.  Her R hip still has overall tightness with occasional popping when she lays for extended periods on her back.        OBJECTIVE: see table  CPT code  Daily treatment record Timed Coded Treatment Minutes   PT Moderate Complexity Eval: 16109     Therapeutic Exercise: 97110 Review of HEP for improve compliance, re-eval completed as noted above: Supine on mat: KTC, piri, hs PROM   bridges 2x10, SLR 4x5 ea    25 min   Therapeutic Activities: 97530     Manual Therapy: 97140      Neuro Re-ed:  Tandem stands, SLS, SLS cone taps, 6 inch step ups 10 min   Gait training:    Pt amb 255 feet, no AD, mod I with minimal trendelenberg 5 min    Ice/Heat       Response to Treatment/Assessment:  Walking improved with decreased hip pain noted.  Pt demos less pain than previously noted. Pt agreeable to continue with HEP at home with discussion on importance of TA and core stabilization activities. Pt agreeable and notes that she will return if her pain returns until then pt is DCed.        Total Treatment TIme:    40 mins  Plan:   12/06/18: Discharge unless otherwise noted or pt calls to return, otherwise this note serves as DC summary as pt has attended 9 appointments          Additional Information:     Patient Education:

## 2018-12-25 MED ORDER — interferon beta-1a, albumin, (REBIF REBIDOSE) 44 mcg/0.5 mL PnIj
44 | INJECTION | SUBCUTANEOUS | 3 refills | 28.00000 days | Status: AC
Start: 2018-12-25 — End: 2019-01-05

## 2019-01-05 MED ORDER — interferon beta-1a, albumin, (REBIF REBIDOSE) 44 mcg/0.5 mL PnIj
44 | SUBCUTANEOUS | 3 refills | 28.00000 days | Status: AC
Start: 2019-01-05 — End: 2019-12-06

## 2019-01-05 MED ORDER — interferon beta-1a, albumin, (REBIF REBIDOSE) 44 mcg/0.5 mL PnIj
44 | SUBCUTANEOUS | 3 refills | 28.00000 days | Status: AC
Start: 2019-01-05 — End: 2019-01-05

## 2019-01-05 NOTE — Unmapped (Signed)
[facility]    A prescription for Rebif Rebidose was sent to [facility].     The patient has been filling the medication at [facility].      A prior authorization was not needed, therefore the prescription was routed to above mentioned pharmacy.    Patient's insurance plan is covering the Rebif as a transition benefit and will require a PA at a later date.    Dierdre Forth, PharmD  Specialty Pharmacy  [phone]

## 2019-01-05 NOTE — Unmapped (Signed)
The patient called to follow up on the request for Rebif. Per chart the 12/25/18 script's transmission was failed. Patient also mention that this does not go to her local pharmacy but she does not know the name of the specialty pharmacy. Please follow up

## 2019-01-05 NOTE — Unmapped (Signed)
Rebif eRx.

## 2019-01-05 NOTE — Unmapped (Signed)
Pt is calling to speak with MA regarding Rebif. Pt states she has a grant to use and it needs to be used right away or it will be taken away.

## 2019-02-28 MED ORDER — LYRICA 75 mg capsule
75 | ORAL_CAPSULE | ORAL | 5 refills | Status: AC
Start: 2019-02-28 — End: 2019-03-30

## 2019-03-22 MED ORDER — cholecalciferol, vitamin D3, 1,250 mcg (50,000 unit) capsule
1250 | ORAL_CAPSULE | ORAL | 2 refills | Status: AC
Start: 2019-03-22 — End: ?

## 2019-03-22 NOTE — Telephone Encounter (Signed)
Please notify pharmacy Christy now sees dr Thomasenia Bottoms.  Thanks.

## 2019-03-28 MED ORDER — baclofen (LIORESAL) 10 MG tablet
10 | ORAL_TABLET | ORAL | 5 refills | Status: AC
Start: 2019-03-28 — End: 2020-11-24

## 2019-04-04 NOTE — Unmapped (Signed)
Spoke with pt stated she doesn't want to do a video visit with Dr.Zabeti.  I advised pt I will set up a telephone visit for her appt.  Gave pt date and time of her telephone visit.

## 2019-04-11 ENCOUNTER — Ambulatory Visit: Admit: 2019-04-11 | Payer: Medicare (Managed Care) | Attending: Neurology

## 2019-04-11 DIAGNOSIS — G35 Multiple sclerosis: Secondary | ICD-10-CM

## 2019-04-11 NOTE — Unmapped (Signed)
Portions of this note have been copied forward from the note written by me or DNP in past but reviewed in their entirety and are accurate to date. I have reviewed and updated the history, physical exam, data, assessment, and plan of the note so that it reflects the evaluation and management of the patient by me on 04/11/2019      Date Symptom   Onset/Outcome Brain MRI Spine MRI Tests DMT period A/E Meds   2000 Fatigue, cognitive G/P        2004 Blurry vision B/L G/I  Dx MS    Rebif IVMP   2013  Stable 04-now  PV -ve      03/2014  09/2014 S 3/15 R leg W after cold    4/15 S       12/2014  9/16 S       12/2015  02/2016     9/17 S      06/2017         03/2018  09/2018  5/19 S 5/19      03/2019 tv                  Onset: Acute/Gradual , Outcome: Resolved/Improved/Progressed, MRI: Peri Ventricular/Juxta Cortical,      HPI:      04/11/19 tele-visit (COVID19):  She is not having any new problem. She takes supplement including Vitamin C and Zinc. She takes Rebif as before without disruption. Patient reports MS symptoms are about the same, denies new symptom or progression of old symptom, no recent fall, no recent UTI or other infection, reports adherence to treatment.      10/04/18:  She reports MS symptoms are about the same, denies new symptom or progression of old symptom, no recent fall, no recent UTI or other infection, reports adherence to treatment.    She was scheduled to see spine surgeon, pain in back moving to left leg she went to [facility] , cardiac and MS issues ruled out, consult neurology thought she has nerve impingement she received steroid shot which was helpful. She started using walker and that is helping too.     04/04/18:  She feels about the same, denies new symptom or progression of old symptom, no recent fall, no recent UTI or other infection, reports adherence to treatment.    She has a cold, back issue is fine improved after lost some weight, MS is stable and not progressed.     07/06/17:  She feels about the  same, denies new symptom or progression of old symptom, no recent fall, no recent UTI or other infection, report adherence to her treatment.      02/25/16 Dr. Rolm Baptise:   Ms Hayworth is here for evaluation of return of shooting pain in the right leg with difficulty walking and went to the Clinic where she was started on Antibiotics (Bactrim x 7 days) and given pain meds, Norco as well as Valium for spasms with improvement 3 days later.  After a week symptoms resolved and she returned to baseline until just yesterday when she started having the shooting pain  and right leg spasm again which affects her walking.  She is still on Rebif and takes it regularly with no missed doses.    01/13/16:  She denies any new symptoms or exacerbation except worsening of fatigue after stopping high dose vitamin D. Her family doctor recommended taking OTC but that does not help.     07/15/15 Sandy:  Ms Danice is  here for evaluation of worsening symptoms.  She reports that she has noticed that she has increased trouble walking when she gets up in the morning, she feels like she is a little of balance.  Once she has walked to the bathroom she is back to baseline.  She has also noticed that she has some pain in her right leg from halfway down her thigh to her calf which occurs every now and then for about 1 month. This has been going on for about 3 weeks, she otherwise has no other new symptoms.    01/08/15:  She thinks overall is stable, denies any new symptom or exacerbation. She received a steroid shot for knee pain which helped.      10/01/14:  I had the pleasure of evaluating Ms Zuch who is here for evaluation of new symptoms.  She is on Rebif since 2004 and take is regulalry with no missed doses.  She developed new pain in her left knee and left leg, She also noticed increased numbness in that extremity which lasted about 3 days and it has started to improve.    04/09/14 Dr. Genelle Gather  On 4/2, she woke up and couldn't walk, felt her R leg was  weak. Also lost sensation with tingling. She waited to see if this would get better. She called on 4/6 and Dr. Genelle Gather thought she is probably having exacerbation. Labs were done with CBC, BMP, UA which did not show evidence of infection. Nurse visited her at home to get IV access. IV solumedrol was started on 4/12. She got the second dose today. Since about Friday last week, sensation was coming back. Weakness is improved but not back to baseline. She is able to walk. Dancing is still hard. She is still on Rebif, doing ok with the injection. This is a first real exacerbation since 2004.   Recent exacerbation happened after recent URI.    02/27/13  She was working as OR Haematologist at [employer]. She was feeling very tired with some cognitive and memory problem around 2000. Thne she had an episode of visual change in both eye in 2004 with partial recovery. She was Dx with MS based on MRI. She was started on Rebif . She thinks it works and tolerating it well however does not like injection. Her last MRI was from April 2013.   Last year she lost 25 Lbs by diet and exercise after she was diagnosed with Diabetes but she regain that weight. She is planning to go back in diet this week.     I reviewed Dr. 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 Pt is currently on Rebif and is tolerating it well. She has not had an MRI since January 2012. Since pts last visit she feels her condition is getting worse. She is experiencing cognitive issues including concentration and memory issues. She has also started to urinate uncontrollably and has missed work as a result. Pt is having trouble walking due to pain in both knees and L hip pain. She also complains of stress at work which causes her to be depressed.   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 Pt is currently on Rebif and is  tolerating it well. She has not had an MRI since January 2012. Since pts last visit she feels her condition  is getting worse. She is experiencing cognitive issues including concentration and memory issues. She has also started to urinate uncontrollably and has missed work as a result. Pt is having trouble walking due to pain in both knees and L hip pain. She also complains of stress at work which causes her to be depressed.   Due to pts complaints of cognitive issues, we will refer her for a neuropsych eval. We will also give her the contact number to the social worker for the [facility]. I would like to obtain and MRI of the brain and c spine.    Review of System:      Depression  Because of safety concern live with her daughter which is stressful   Bladder incontinence   Nocturia   Weakness:  Walker since 2019  for long distance,   Pain:     R. Knee and hip, Steroid shot and Gabapentin   Sensory  Numbness r. foot  Fatigue:  bothersome  Sleep:        4 to 5 hrs uninterrupted,   Sleep Apnea:  No longer uses CPAP    STM   Main symptom she get disability   Urinary Incontinece Oxybutinin helps, she has done pelvic exercise, this was one the reason she stopped working   Walking Difficulty Tired and back pain, can do more after exercise   Imbalance  Trip over but no fall   Fatigue                        Exhausted    Life Style:      Living Situation: With daughters family  Diet:  Diet  Exercise: Line Dance as exercise 4 days a week   Vitamin D: 50 K weekly     Medications:      Current Outpatient Medications   Medication Sig   ??? albuterol Inhale 2 puffs into the lungs. Every 4 to 6 hours as needed.    ??? baclofen TAKE ONE TABLET BY MOUTH THREE TIMES A DAY   ??? benzonatate Take by mouth.   ??? blood sugar diagnostic Test blood sugars up to twice a day   ??? cholecalciferol (vitamin D3) TAKE ONE CAPSULE BY MOUTH EVERY 7 DAYS   ??? famotidine Take by mouth.   ??? ibuprofen Take 1 tablet (800 mg total) by mouth every 8 hours as needed for  Pain.   ??? inhalational spacing device by Misc.(Non-Drug; Combo Route) route. AEROCHAMBER PLUS.  Use with MDI    ??? interferon beta-1a (albumin) Inject 44 mcg subcutaneously every Monday, Wednesday, and Friday. Inject under the skin three times a week as directed by physician.   ??? lovastatin Take 40 mg by mouth daily with dinner.   ??? metFORMIN Take 1 tablet (500 mg total) by mouth 2 times a day with meals.   ??? oxybutynin Take 1 tablet (10 mg total) by mouth daily. Indications: Urinary Urgency   ??? pioglitazone-metformin Take 1 tablet by mouth daily.   ??? pregabalin Take 100 mg by mouth 2 times a day. Indications: Take one capsule by mouth twice a day   ??? triamterene-hydrochlorothiazide Take 1 tablet by mouth daily.     No current facility-administered medications for this visit.       Allergies: Compazine [prochlorperazine edisylate] and Prochlorperazine    History:      PMHx:  has a past medical history of Asthma, Diabetes mellitus (CMS Dx), Hypertension, Multiple sclerosis (CMS Dx),  and Neuromuscular disorder (CMS Dx).     PSHx:  has a past surgical history that includes Knee surgery; Appendectomy; and Hysterectomy.    Social Hx: reports that she has never smoked. She has never used smokeless tobacco. She reports current alcohol use. She reports that she does not use drugs. She lives by herself. She feels she is safe but has to take extra time. Feels depressed being lonely. Grandkids lives cross the street and come and visit her. She goes to [facility], do dance club and try to stay active.     ZOX:WRUEAV history includes Alcohol abuse in her father and other; Brain Cancer in her other; COPD in her father; Cancer in her father, maternal grandmother, mother, and sister; Diabetes in her daughter, paternal aunt, paternal grandfather, and paternal uncle; Skin Cancer in her other; Stomach Cancer in her other; Throat Cancer in her other.    Physical Exam:    There were no vitals taken for this visit.   Limited exam through  video visit shows no significant change from last in person exam documented below:   General: NAD  Resp:     Clear Bilaterally, regular unlabored breaths  CV:     RRR  No Edema, no skin rashes    Mental Status:Patient registered 3 objects without difficulty and recalled 3/3 after five minutes.  The patient was fully oriented and had a normal fund of knowledge.  Cranial Nerves: Color Vision12/12 OD and 10/12 OS, best corrected visual acuity was 20/20 OD and 20/20 OS.  There was no afferent pupillary deficit.  Funduscopic exam revealed normal optic nerve heads.   Horizontal and vertical eye movements were full.  There was no nystagmus.  Facial sensation was intact to pinprick.  There was no facial weakness.  Hearing was intact to finger rubbing bilaterally. Palate moved fully and symmetrically.  Tongue protruded in the midline.  There was no dysarthria.  Motor:   Strength:R-L (MRC Scale)   Deltoid  5 - 5   Hip Fx   4+ 4(R. Hip limited by pain)  Biceps   5 - 5   Knee Ex  5 - 4   Triceps  5 - 5   Knee Fx  5 - 4   Wrist Ex  5 - 5   Plantar Fx 5 - 4   Grip         5 - 5   Ankle dorsiflex 5 - 4  Tone was normal  Reflexes: R / L (MRC Scale)   Biceps  2 / 2   Quadriceps 2 / 2   Triceps  2 / 2   Achilles  2 / 2   Brachoradialis 2 / 2   Toes   Up / down   Coordination: There was no ataxia on finger to nose or heel to shin testing.  Sensation: Decreased vibration BLE.     Position and pinprick sensation were intact in all four extremities.  Gait and station:  LE weakness, uses walker for long distance  25 foot walk 02/27/2013 04/09/2014 10/01/2014 01/08/2015 07/15/2015 01/13/2016 07/06/2017   Did the patient wear an ankle foot orthosis (AFO)? No No No No No No No   Was an assistive device used? No No No No No No No   Trial No. 1: Completed Completed Completed Completed Completed - -   Time for 25 Foot Walk: 6.06 secs 7.46 secs 8.26 secs 5.40 secs 5.81 secs 4.96 secs 6.21 secs  Tests:      05/01/18  MRI brain:  Unchanged moderate MS  plaque burden when compared to 09/04/2015. No evidence for active demyelination.  Suspect superimposed chronic small vessel ischemic changes.  MRI cervical spine:  Slightly limited by motion. No focal abnormal cord signal or enhancement.  Mild degenerative changes, most pronounced at C4-C5 with facet arthropathy resulting in suspected bilateral neural foraminal narrowing.  MRI thoracic spine:  No definite abnormal spinal cord signal or enhancement.  Mild epidural lipomatosis, unchanged.    09/23/16   Cervical spine:  Unchanged minimal noncompressive disc bulges at C4-C5, and C5-C6 without spinal cord signal abnormalities or enhancement.   Fairly extensive signal abnormality seen in the pons which appears unchanged  Thoracic spine:  Limited evaluation of thoracic cord signal secondary to artifact, without definite cord signal abnormality.  Lumbar spine:  Multilevel degenerative changes of the lumbar spine as detailed, most significant at L2-L3 and L3-L4 as detailed. There is moderate bilateral L3 and right L4 foraminal stenosis.  Enhancement along the facet joints at L3-L4 may represent synovitis. At this level, there is minimal grade 1 anterolisthesis of L3 on L4.    09/04/15 compare to 04/08/14  Brain:  1. Stable moderate plaque burden within the periventricular and subcortical white matter compatible with the history of MS. No new or active lesions.  Cervical Spine:  1. No cord signal abnormality or enhancing lesions.  Thoracic spine:  1.?? No definite cord signal abnormality or enhancing lesions. Motion degraded examination limits cord evaluation.    04/08/14 COMPARISON: MRI dated 03/27/2012.  MRI head:   1. No significant interval change in the moderate burden of T2 signal prolongation involving the periventricular, deep, and central pontine white matter consistent with the clinical diagnosis of multiple sclerosis. No new lesions, restricted diffusion, or enhancement is identified.   2. Small amount of layering fluid  signal in the right maxillary sinus.         MRI cervical spine:   1. No evidence of cord signal abnormalities or enhancement.   2. Very mild multilevel degenerative discogenic changes without evidence of central canal or neuroforaminal stenosis, unchanged.         MRI thoracic spine:   1. No evidence of cord signal abnormalities or enhancement.   2. Very mild degenerative discogenic changes without evidence of central canal or neuroforaminal stenosis, involving the T3-T4 through T7-T8 levels.      Lab Results   Component Value Date    WBC 9.5 04/04/2018    HGB 15.1 04/04/2018    HCT 44.8 04/04/2018    MCV 91.0 04/04/2018    PLT 220 04/04/2018     Lab Results   Component Value Date    CREATININE 0.97 04/04/2018    BUN 21 04/04/2018    NA 136 04/04/2018    K 4.7 04/04/2018    CL 95 (L) 04/04/2018    CO2 29 04/04/2018     Lab Results   Component Value Date    ALT 58 (H) 04/04/2018    AST 60 (H) 04/02/2014    ALKPHOS 50 04/04/2018    BILITOT 0.3 04/04/2018     Lab Results   Component Value Date    VITD25H 100.0 04/04/2018     Lab Results   Component Value Date    VITAMINB12 359 04/04/2018     Lab Results   Component Value Date    HGBA1C 7.3 (H) 04/04/2018      Assessment/Plan:  Ms. Hastings is a 63 y.o. female with Multiple Sclerosis diagnosed in 2004 who has treated with Rebif eversince. I educated her about current knowledge about COVID 36 in MS patient and since we feel Rebif is safe during this period we decided to continue it. She has Asthma, DM and HTN all are risk factor for COVID 19 she assured me with her daughter help she is taking extra caution and follow CDC guidelines.  She has been very stable for several years so we decided to skip the MRI this year. Plan to repeat MRI in 2021 and return after MRI. She will go to lab after COVID 19 for safety labs.     1. Multiple sclerosis  - Continue Rebif  - MRI in 2021  - safety labs after COVID 19 crisis   - RTC in 6 mo with DNP    2. Vitamin D deficiency  -  Vitamin D 25 hydroxy; Future  - continue supplement      This was a phone conversation in lieu of an in-person visit. The patient provided verbal consent for an over the phone visit.    I spent 15 to 19 minutes  on this call conducting an interview, performing a limited exam by phone, and educating the patient on my assessment and plan.    Raeanne Gathers, MD  Assistant Professor of Neurology   Director of [facility]   [address]   [place], [place] [address]????  [phone]

## 2019-07-09 NOTE — Unmapped (Signed)
Patient can resume Rebif.

## 2019-07-09 NOTE — Unmapped (Signed)
Called patient back and relayed Dr. Edmund Hilda message. Patient verbalized understanding. Noted she does not have a follow up appointment scheduled. Scheduled follow up appointment with her for Dr. Genelle Gather. She did not want to see [place].

## 2019-07-09 NOTE — Unmapped (Signed)
Patient called in stating that she was hospitalized for COVID for 2 wks and they advised her to stop taking the following medication:    interferon beta-1a, albumin, (REBIF REBIDOSE) 44 mcg/0.5 mL PnIj   Sig: Inject 44 mcg subcutaneously every Monday, Wednesday, and Friday. Inject under the skin three times a week as directed by physician.    Patient reports she has not taken this medication since 06/16/19 and she would like to know if she can start back up on the medication or if she needs to stop it all together.     Patient reports that the medication helps with her memory and just wants to know what to do.     Patient was prescribed 2 medications. 1 is for Ulcers and the other is for BP to take for 30 days. Patient is unsure of medication names.     Patient can be reached at [phone].

## 2019-07-09 NOTE — Unmapped (Signed)
Called patient back and she was discharged Thursday 07/05/19.     Denies any symptoms of COVID-19 currently.     Patient hasn't had Rebif since 6/20 because she was sick Sunday 6/21. Admitted 06/20/19 to [facility]. States that they did another COVID-19 test the Tuesday before she left and she is still waiting on the results of it. She has left a VM for her PCP to call back.     Patient was started on 2 different medications. Metoprolol tartrate 25 mg BID, pantoprazole 40 mg qAM.     Told her I would send this to Dr. Genelle Gather and then call her back. Patient verbalized understanding.

## 2019-07-16 NOTE — Unmapped (Signed)
Melanie [name] states she was hospitalized x 2 weeks in [facility] for COVID-19  States she is checking her pulse aux 3 x's daily     Patient c/o pain in her right leg , pain level 5/10  States she has been taking Tylenol for pain   Patient  requesting MD's advice     Patient can be reached at [phone] (H)

## 2019-07-16 NOTE — Unmapped (Signed)
Called patient back.     States that she started having pain in the right leg in the hospital. States that a week after she got home it got worse. States she spoke with her PCP Dr. Tasia Catchings and she has an ultrasound Friday ordered by PCP Dr. Tasia Catchings to rule out a blood clot.     States that the leg is both numb and throbbing at the same time. The leg is numb at the top of the leg and to the side and does look like it has the same amount of swelling as the other leg she said.     She started putting sports cream- Aspercreme on it Friday and that is the only thing that really helps the throbbing. The tylenol is not helping at all.     She is still taking the rebif (restarted it as per recommendation encounter 07/09/19), lyrica, and baclofen.     Educated her that anytime she is sick her MS symptoms may worsen so it could be related to the fact that she was so sick and is still recovering. Told her it was good that she called her PCP and has the ultrasound ordered. Told her I would send this to Dr. Genelle Gather and see if he has any further recommendations. Told her would call her back at that time. She verbalized understanding.

## 2019-07-16 NOTE — Unmapped (Signed)
Will verify Lyrica dose and adjust accordingly.

## 2019-07-17 MED ORDER — pregabalin (LYRICA) 100 MG capsule
100 | ORAL_CAPSULE | Freq: Three times a day (TID) | ORAL | 5 refills | Status: AC
Start: 2019-07-17 — End: 2019-11-06

## 2019-07-17 NOTE — Unmapped (Signed)
Addended byRaeanne Gathers on: 07/17/2019 02:03 PM     Modules accepted: Orders

## 2019-07-17 NOTE — Unmapped (Signed)
Will increase Lyrica Rx to 100 mg tid

## 2019-07-17 NOTE — Unmapped (Signed)
Called patient and relayed Dr. Edmund Hilda message. Patient is taking Lyrica 100 mg BID. He said she could increase to 100 mg TID. 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 Reminded her not to drive or operate heavy machinery till she knows how it will affect her. Patient verbalized understanding.     She said that her PCP had her ultrasound appointment moved up to today at 1500 since her pain is so severe. She is going to have them fax [place] the report as well.

## 2019-08-29 NOTE — Telephone Encounter (Signed)
Pt states she received a medical massage years ago.      Lashun states the massage helped loosen her muscles.    Also asks if she can get an order for the therapy.    Please follow up with Marylu Lund.

## 2019-08-30 NOTE — Telephone Encounter (Signed)
I placed the referral to integrative medicine for massage therapy.

## 2019-08-31 NOTE — Unmapped (Signed)
Spoke with pt to let her know referral was placed and gave pt number to schedule. [phone]).

## 2019-09-05 NOTE — Telephone Encounter (Signed)
Pt is calling to speak with [place] regarding [employer] paperwork. Pt states she needs to discuss how to complete the paperwork.

## 2019-09-05 NOTE — Telephone Encounter (Signed)
Spoke with pt advised I haven't received any paperwork. Also informed pt once I receive it I will send to veronica to be completed.

## 2019-09-12 NOTE — Unmapped (Signed)
Pt asks the status of her MS paperwork.  Please give the pt a call back.  Ph # [phone] (H)

## 2019-09-12 NOTE — Telephone Encounter (Signed)
SW called pt back. Confimred paperwork rec'd and in process. Pt states she was delayed in submitting paperwork becaue she had COVID and hospitalized for 2 weeks.   Will fax paperwork once receive signature by MD. Pt expressed understanding and appreciation

## 2019-09-12 NOTE — Telephone Encounter (Signed)
Pt called again requesting the status of her MS paperwork.    States she need the paperwork completed as soon as possible.    Pt requests return call at [phone].

## 2019-09-18 NOTE — Telephone Encounter (Signed)
Patient called in with a Referral for massage therapy due to her MS referred by Dr. Genelle Gather.    The patient has some questions in regards to what the actual cost will be at the time of the visit, if it can be billed to her insurance, what exactly is the process for the [employer], and what the appt will entail for her first visit.     The patient would like to know the extensive details of the appt being self-pay and then submitting for reimbursement through her insurance.     Patient can be reached at [phone].

## 2019-09-19 NOTE — Telephone Encounter (Signed)
Late entry    Patient's paperwork was faxed to [facility] at [phone] on 09/14/19 at 10:15 AM confirmation received.       Paperwork sent today to med records for scanning.

## 2019-09-20 NOTE — Telephone Encounter (Signed)
Melanie [name] requesting call back regarding massage therapy   States she needs MD to write a letter to her insurance company stating she has MS and that therapy is needed     Patient requesting call back as soon as possible     Patient can be reached at [phone] (home)

## 2019-09-20 NOTE — Telephone Encounter (Signed)
I spoke with Melanie [name] and discussed self pay and Super bill procedure for massage.  She will call her insurance company and find out if they will reimburse her if she pays at TOS.  Pt will call back if she wants to schedule.  I gave her my #.

## 2019-09-21 NOTE — Unmapped (Signed)
The pt called in very frustrated due to no one calling her back about the request she has to receive massage therapy     The pt called in on 09/18/19 stating the below information     Patient called in with a Referral for massage therapy due to her MS referred by Dr. Genelle Gather.  ??  The patient has some questions in regards to what the actual cost will be at the time of the visit, if it can be billed to her insurance, what exactly is the process for the [employer], and what the appt will entail for her first visit.   ??  The patient would like to know the extensive details of the appt being self-pay and then submitting for reimbursement through her insurance.      The pt called in today stating that when the referral is placed that it doe not say massage therapy due to her insurance not paying for it Melanie [name] requesting call back regarding massage therapy.States she needs MD to write a letter to her insurance company stating she has MS and that therapy is needed)     I stated to her that MD Zabeti was out of the office at this time but I will send the concern to the CNP   ??

## 2019-11-06 ENCOUNTER — Ambulatory Visit: Admit: 2019-11-06 | Discharge: 2019-11-14 | Payer: Medicare (Managed Care) | Attending: Neurology

## 2019-11-06 ENCOUNTER — Ambulatory Visit: Payer: Medicare (Managed Care) | Attending: Neurology

## 2019-11-06 DIAGNOSIS — G35 Multiple sclerosis: Secondary | ICD-10-CM

## 2019-11-06 MED ORDER — pregabalin (LYRICA) 100 MG capsule
100 | ORAL_CAPSULE | Freq: Three times a day (TID) | ORAL | 3 refills | Status: AC
Start: 2019-11-06 — End: 2020-11-24

## 2019-11-06 NOTE — Unmapped (Signed)
Portions of this note have been copied forward from the note written by me or DNP in past but reviewed in their entirety and are accurate to date. I have reviewed and updated the history, physical exam, data, assessment, and plan of the note so that it reflects the evaluation and management of the patient by me on 11/06/2019      Date Symptom   Onset/Outcome Brain MRI Spine MRI Tests DMT period A/E Meds   2000 Fatigue, cognitive G/P        2004 Blurry vision B/L G/I  Dx Melanie    Rebif IVMP   2013  Stable 04-now  PV -ve      03/2014  09/2014 S 3/15 R leg W after cold    4/15 S       12/2014  9/16 S       12/2015  02/2016     9/17 S      06/2017         03/2018  09/2018  5/19 S 5/19      03/2019 pv  11/2020pv   6/20 COVID19                 Onset: Acute/Gradual , Outcome: Resolved/Improved/Progressed, MRI: Peri Ventricular/Juxta Cortical,      HPI:      11/06/19 phone-visit (COVID19):  Melanie [name] was admitted on June 28th for COVID 19 Dx then discharged for Rehab for a week. I reviewed some of the notes related to patient admission to the [facility].   Patient reports at this time Melanie symptoms are about the same, denies new symptom or progression of old symptom, no recent fall, no recent UTI or other infection, reports adherence to treatment.    She c/o pain in left leg, Rx GBP creme which helped but still get pain and numbness at times, she takes Lyrica only twice a day.    04/11/19 tele-visit (COVID19):  She is not having any new problem. She takes supplement including Vitamin C and Zinc. She takes Rebif as before without disruption. Patient reports Melanie symptoms are about the same, denies new symptom or progression of old symptom, no recent fall, no recent UTI or other infection, reports adherence to treatment.    10/04/18:  She reports Melanie symptoms are about the same, denies new symptom or progression of old symptom, no recent fall, no recent UTI or other infection, reports adherence to treatment.    She was scheduled to see  spine surgeon, pain in back moving to left leg she went to [facility] , cardiac and Melanie issues ruled out, consult neurology thought she has nerve impingement she received steroid shot which was helpful. She started using walker and that is helping too.     04/04/18:  She feels about the same, denies new symptom or progression of old symptom, no recent fall, no recent UTI or other infection, reports adherence to treatment.    She has a cold, back issue is fine improved after lost some weight, Melanie is stable and not progressed.     07/06/17:  She feels about the same, denies new symptom or progression of old symptom, no recent fall, no recent UTI or other infection, report adherence to her treatment.      02/25/16 Dr. Rolm Baptise:   Melanie [name] is here for evaluation of return of shooting pain in the right leg with difficulty walking and went to the Clinic where she was started on Antibiotics (Bactrim x  7 days) and given pain meds, Norco as well as Valium for spasms with improvement 3 days later.  After a week symptoms resolved and she returned to baseline until just yesterday when she started having the shooting pain  and right leg spasm again which affects her walking.  She is still on Rebif and takes it regularly with no missed doses.    01/13/16:  She denies any new symptoms or exacerbation except worsening of fatigue after stopping high dose vitamin D. Her family doctor recommended taking OTC but that does not help.     07/15/15 Sandy:  Melanie [name] is here for evaluation of worsening symptoms.  She reports that she has noticed that she has increased trouble walking when she gets up in the morning, she feels like she is a little of balance.  Once she has walked to the bathroom she is back to baseline.  She has also noticed that she has some pain in her right leg from halfway down her thigh to her calf which occurs every now and then for about 1 month. This has been going on for about 3 weeks, she otherwise has no other new  symptoms.    01/08/15:  She thinks overall is stable, denies any new symptom or exacerbation. She received a steroid shot for knee pain which helped.      10/01/14:  I had the pleasure of evaluating Melanie [name] who is here for evaluation of new symptoms.  She is on Rebif since 2004 and take is regulalry with no missed doses.  She developed new pain in her left knee and left leg, She also noticed increased numbness in that extremity which lasted about 3 days and it has started to improve.    04/09/14 Dr. Genelle Gather  On 4/2, she woke up and couldn't walk, felt her R leg was weak. Also lost sensation with tingling. She waited to see if this would get better. She called on 4/6 and Dr. Genelle Gather thought she is probably having exacerbation. Labs were done with CBC, BMP, UA which did not show evidence of infection. Nurse visited her at home to get IV access. IV solumedrol was started on 4/12. She got the second dose today. Since about Friday last week, sensation was coming back. Weakness is improved but not back to baseline. She is able to walk. Dancing is still hard. She is still on Rebif, doing ok with the injection. This is a first real exacerbation since 2004.   Recent exacerbation happened after recent URI.    02/27/13  She was working as OR Haematologist at [employer]. She was feeling very tired with some cognitive and memory problem around 2000. Thne she had an episode of visual change in both eye in 2004 with partial recovery. She was Dx with Melanie based on MRI. She was started on Rebif . She thinks it works and tolerating it well however does not like injection. Her last MRI was from April 2013.   Last year she lost 25 Lbs by diet and exercise after she was diagnosed with Diabetes but she regain that weight. She is planning to go back in diet this week.     I reviewed Dr. 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 Pt is  currently on Rebif and is tolerating it well. She has not had an MRI since January 2012. Since  pts last visit she feels her condition is getting worse. She is experiencing cognitive issues including concentration and memory issues. She has also started to urinate uncontrollably and has missed work as a result. Pt is having trouble walking due to pain in both knees and L hip pain. She also complains of stress at work which causes her to be depressed.   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 Pt is currently on Rebif and is tolerating it well. She has not had an MRI since January 2012. Since pts last visit she feels her condition is getting worse. She is experiencing cognitive issues including concentration and memory issues. She has also started to urinate uncontrollably and has missed work as a result. Pt is having trouble walking due to pain in both knees and L hip pain. She also complains of stress at work which causes her to be depressed.   Due to pts complaints of cognitive issues, we will refer her for a neuropsych eval. We will also give her the contact number to the social worker for the [facility]. I would like to obtain and MRI of the brain and c spine.    Review of System:      Depression  Because of safety concern live with her daughter which is stressful   Bladder incontinence   Nocturia   Weakness:  Walker since 2019  for long distance,   Pain:     R. Knee and hip, Steroid shot and Gabapentin   Sensory  Numbness r. foot  Fatigue:  bothersome  Sleep:        4 to 5 hrs uninterrupted,   Sleep Apnea:  No longer uses CPAP    STM   Main symptom she get disability   Urinary Incontinece Oxybutinin helps, she has done pelvic exercise, this was one the reason she stopped working   Walking Difficulty Tired and back pain, can do more after exercise   Imbalance  Trip over but no fall   Fatigue                        Exhausted    Life Style:      Living Situation: With daughters  family  Diet:  Diet  Exercise: Line Dance as exercise 4 days a week   Vitamin D: 50 K weekly     Medications:      Current Outpatient Medications   Medication Sig   ??? albuterol Inhale 2 puffs into the lungs. Every 4 to 6 hours as needed.    ??? baclofen TAKE ONE TABLET BY MOUTH THREE TIMES A DAY   ??? benzonatate Take by mouth.   ??? blood sugar diagnostic Test blood sugars up to twice a day   ??? cholecalciferol (vitamin D3) TAKE ONE CAPSULE BY MOUTH EVERY 7 DAYS   ??? famotidine Take by mouth.   ??? ibuprofen Take 1 tablet (800 mg total) by mouth every 8 hours as needed for Pain.   ??? inhalational spacing device by Misc.(Non-Drug; Combo Route) route. AEROCHAMBER PLUS.  Use with MDI    ??? interferon beta-1a (albumin) Inject 44 mcg subcutaneously every Monday, Wednesday, and Friday. Inject under the skin three times a week as directed by physician.   ??? lovastatin Take 40 mg by mouth daily with dinner.   ??? metFORMIN Take 1 tablet (500 mg total) by mouth 2 times a day with meals.   ???  oxybutynin Take 1 tablet (10 mg total) by mouth daily. Indications: Urinary Urgency   ??? pioglitazone-metformin Take 1 tablet by mouth daily.   ??? pregabalin Take 1 capsule (100 mg total) by mouth 3 times a day. Indications: Take one capsule by mouth twice a day   ??? triamterene-hydrochlorothiazide Take 1 tablet by mouth daily.     No current facility-administered medications for this visit.       Allergies: Compazine [prochlorperazine edisylate] and Prochlorperazine    History:      PMHx:  has a past medical history of Asthma, Diabetes mellitus (CMS Dx), Hypertension, Multiple sclerosis (CMS Dx), and Neuromuscular disorder (CMS Dx).     PSHx:  has a past surgical history that includes Knee surgery; Appendectomy; and Hysterectomy.    Social Hx: reports that she has never smoked. She has never used smokeless tobacco. She reports current alcohol use. She reports that she does not use drugs. She lives by herself. She feels she is safe but has to take  extra time. Feels depressed being lonely. Grandkids lives cross the street and come and visit her. She goes to [facility] [employer] [facility], do dance club and try to stay active.     ZOX:WRUEAV history includes Alcohol abuse in her father and other; Brain Cancer in her other; COPD in her father; Cancer in her father, maternal grandmother, mother, and sister; Diabetes in her daughter, paternal aunt, paternal grandfather, and paternal uncle; Skin Cancer in her other; Stomach Cancer in her other; Throat Cancer in her other.    Physical Exam:    There were no vitals taken for this visit.   Limited exam through video visit shows no significant change from last in person exam documented below:   Mental Status:Patient registered 3 objects without difficulty and recalled 3/3 after five minutes.  The patient was fully oriented and had a normal fund of knowledge.  Cranial Nerves: Color Vision12/12 OD and 10/12 OS, best corrected visual acuity was 20/20 OD and 20/20 OS.  There was no afferent pupillary deficit.  Funduscopic exam revealed normal optic nerve heads.   Horizontal and vertical eye movements were full.  There was no nystagmus.  Facial sensation was intact to pinprick.  There was no facial weakness.  Hearing was intact to finger rubbing bilaterally. Palate moved fully and symmetrically.  Tongue protruded in the midline.  There was no dysarthria.  Motor:   Strength:R-L (MRC Scale)   Deltoid  5 - 5   Hip Fx   4+ 4(R. Hip limited by pain)  Biceps   5 - 5   Knee Ex  5 - 4   Triceps  5 - 5   Knee Fx  5 - 4   Wrist Ex  5 - 5   Plantar Fx 5 - 4   Grip         5 - 5   Ankle dorsiflex 5 - 4  Tone was normal  Reflexes: R / L (MRC Scale)   Biceps  2 / 2   Quadriceps 2 / 2   Triceps  2 / 2   Achilles  2 / 2   Brachoradialis 2 / 2   Toes   Up / down   Coordination: There was no ataxia on finger to nose or heel to shin testing.  Sensation: Decreased vibration BLE.     Position and pinprick sensation were intact in all four extremities.  Gait and station:   LE weakness, uses walker for long distance  25 foot walk 02/27/2013 04/09/2014 10/01/2014 01/08/2015 07/15/2015 01/13/2016 07/06/2017   Did the patient wear an ankle foot orthosis (AFO)? No No No No No No No   Was an assistive device used? No No No No No No No   Trial No. 1: Completed Completed Completed Completed Completed - -   Time for 25 Foot Walk: 6.06 secs 7.46 secs 8.26 secs 5.40 secs 5.81 secs 4.96 secs 6.21 secs      Tests:      05/01/18  MRI brain:  Unchanged moderate Melanie plaque burden when compared to 09/04/2015. No evidence for active demyelination.  Suspect superimposed chronic small vessel ischemic changes.  MRI cervical spine:  Slightly limited by motion. No focal abnormal cord signal or enhancement.  Mild degenerative changes, most pronounced at C4-C5 with facet arthropathy resulting in suspected bilateral neural foraminal narrowing.  MRI thoracic spine:  No definite abnormal spinal cord signal or enhancement.  Mild epidural lipomatosis, unchanged.    09/23/16   Cervical spine:  Unchanged minimal noncompressive disc bulges at C4-C5, and C5-C6 without spinal cord signal abnormalities or enhancement.   Fairly extensive signal abnormality seen in the pons which appears unchanged  Thoracic spine:  Limited evaluation of thoracic cord signal secondary to artifact, without definite cord signal abnormality.  Lumbar spine:  Multilevel degenerative changes of the lumbar spine as detailed, most significant at L2-L3 and L3-L4 as detailed. There is moderate bilateral L3 and right L4 foraminal stenosis.  Enhancement along the facet joints at L3-L4 may represent synovitis. At this level, there is minimal grade 1 anterolisthesis of L3 on L4.    09/04/15 compare to 04/08/14  Brain:  1. Stable moderate plaque burden within the periventricular and subcortical white matter compatible with the history of Melanie. No new or active lesions.  Cervical Spine:  1. No cord signal abnormality or enhancing lesions.  Thoracic spine:  1.?? No definite  cord signal abnormality or enhancing lesions. Motion degraded examination limits cord evaluation.    04/08/14 COMPARISON: MRI dated 03/27/2012.  MRI head:   1. No significant interval change in the moderate burden of T2 signal prolongation involving the periventricular, deep, and central pontine white matter consistent with the clinical diagnosis of multiple sclerosis. No new lesions, restricted diffusion, or enhancement is identified.   2. Small amount of layering fluid signal in the right maxillary sinus.         MRI cervical spine:   1. No evidence of cord signal abnormalities or enhancement.   2. Very mild multilevel degenerative discogenic changes without evidence of central canal or neuroforaminal stenosis, unchanged.         MRI thoracic spine:   1. No evidence of cord signal abnormalities or enhancement.   2. Very mild degenerative discogenic changes without evidence of central canal or neuroforaminal stenosis, involving the T3-T4 through T7-T8 levels.      Lab Results   Component Value Date    WBC 9.5 04/04/2018    HGB 15.1 04/04/2018    HCT 44.8 04/04/2018    MCV 91.0 04/04/2018    PLT 220 04/04/2018     Lab Results   Component Value Date    CREATININE 0.97 04/04/2018    BUN 21 04/04/2018    NA 136 04/04/2018    K 4.7 04/04/2018    CL 95 (L) 04/04/2018    CO2 29 04/04/2018     Lab Results   Component Value Date    ALT 58 (H) 04/04/2018  AST 60 (H) 04/02/2014    ALKPHOS 50 04/04/2018    BILITOT 0.3 04/04/2018     Lab Results   Component Value Date    VITD25H 100.0 04/04/2018     Lab Results   Component Value Date    VITAMINB12 359 04/04/2018     Lab Results   Component Value Date    HGBA1C 7.3 (H) 04/04/2018      Assessment/Plan:       Melanie. [name] is a 63 y.o. female with Multiple Sclerosis diagnosed in 2004 who has treated with Rebif eversince. She was infected by COVID19 on June 28th 2020 but recovered fully.     1. Multiple sclerosis  - Continue Rebif  - MRI in 6 mo  - RTC in 6 mo     2. Neuropathic pain  -  pregabalin (LYRICA) 100 MG capsule; Take 1 capsule (100 mg total) by mouth 3 times a day. Indications: Take one capsule by mouth twice a day  Dispense: 270 capsule; Refill: 3       This was a Phone conversation, in lieu of an in-person visit. The patient provided verbal consent to participate in the telehealth visit.   I spent 15 minutes speaking with the patient, conducting an interview, performing a limited exam, and educating the patient on my assessment and plan. I also spent 5 minutes, on the same day as the encounter, preparing to see the patient (eg, review of tests), ordering medications, tests, or procedures, documenting clinical information in the electronic or other health record and performing non-face-to-face activities.       Raeanne Gathers, MD  Assistant Professor of Neurology   Director of [facility]   [address]   [place], [place] [address]????  [phone]

## 2019-11-06 NOTE — Telephone Encounter (Signed)
Spoke with pt scheduled her 6 month telephone visit.

## 2019-11-06 NOTE — Unmapped (Signed)
-----   Message from Raeanne Gathers, MD sent at 11/06/2019  8:49 AM EST -----  Bethann Berkshire, please schedule 6 mo follow-up with me.  Consuella Lose, please report her to [facility] registry.     Thanks,  Laqueta Due

## 2019-11-06 NOTE — Unmapped (Signed)
RN registered Ms. Navalta in the [employer].    Herma Ard, BSN, RN  RN Care Coordinator  [facility]  [address] Floor  [place], [place] [address]  [phone]

## 2019-12-06 MED ORDER — REBIF REBIDOSE 44 mcg/0.5 mL PnIj
44 | SUBCUTANEOUS | 10 refills | Status: AC
Start: 2019-12-06 — End: 2020-11-04

## 2019-12-06 NOTE — Unmapped (Signed)
Pt's LOV 11/06/19  Pt's NOV 05/07/20    The pt is requesting that the following medication to be refilled at the preferred pharmacy     interferon beta-1a, albumin, (REBIF REBIDOSE) 44 mcg/0.5 mL PnIj     This was given 3 refills on 01/05/19

## 2020-05-07 ENCOUNTER — Ambulatory Visit: Admit: 2020-05-07 | Payer: Medicare (Managed Care) | Attending: Neurology

## 2020-05-07 DIAGNOSIS — G35 Multiple sclerosis: Secondary | ICD-10-CM

## 2020-05-07 NOTE — Unmapped (Signed)
Portions of this note have been copied forward from the note written by me or DNP in past but reviewed in their entirety and are accurate to date. I have reviewed and updated the history, physical exam, data, assessment, and plan of the note so that it reflects the evaluation and management of the patient by me on 05/07/2020      Date Symptom   Onset/Outcome Brain MRI Spine MRI Tests DMT period A/E Meds   2000 Fatigue, cognitive G/P        2004 Blurry vision B/L G/I  Dx MS    Rebif IVMP   2013  Stable 04-now  PV -ve      03/2014  09/2014 S 3/15 R leg W after cold    4/15 S       12/2014  9/16 S       12/2015  02/2016     9/17 S      06/2017         03/2018  09/2018  5/19 S 5/19      03/2019 pv  11/2020pv   6/20 COVID19        5/2021pv                           Onset: Acute/Gradual , Outcome: Resolved/Improved/Progressed, MRI: Peri Ventricular/Juxta Cortical,      HPI:      05/07/20 phone-visit:  Page Spiro is seen for routine follow-up appointment, she c/o numbness in right back feels numb, noticed first when hospitalized in July 2020 c/o it's affecting back and legs, she is taking care of her sick daughter and can not get the MRI now, denies recent fall, no recent UTI or other infection, reports adherence to treatment.      11/06/19 phone-visit (COVID19):  MEGGIN OLA was admitted on June 28th for COVID 19 Dx then discharged for Rehab for a week. I reviewed some of the notes related to patient admission to the [facility].   Patient reports at this time MS symptoms are about the same, denies new symptom or progression of old symptom, no recent fall, no recent UTI or other infection, reports adherence to treatment.    She c/o pain in left leg, Rx GBP creme which helped but still get pain and numbness at times, she takes Lyrica only twice a day.    04/11/19 tele-visit (COVID19):  She is not having any new problem. She takes supplement including Vitamin C and Zinc. She takes Rebif as before without disruption. Patient reports  MS symptoms are about the same, denies new symptom or progression of old symptom, no recent fall, no recent UTI or other infection, reports adherence to treatment.    10/04/18:  She reports MS symptoms are about the same, denies new symptom or progression of old symptom, no recent fall, no recent UTI or other infection, reports adherence to treatment.    She was scheduled to see spine surgeon, pain in back moving to left leg she went to [facility] , cardiac and MS issues ruled out, consult neurology thought she has nerve impingement she received steroid shot which was helpful. She started using walker and that is helping too.     04/04/18:  She feels about the same, denies new symptom or progression of old symptom, no recent fall, no recent UTI or other infection, reports adherence to treatment.    She has a cold, back issue is fine improved  after lost some weight, MS is stable and not progressed.     07/06/17:  She feels about the same, denies new symptom or progression of old symptom, no recent fall, no recent UTI or other infection, report adherence to her treatment.      02/25/16 Dr. Rolm Baptise:   Ms Parks is here for evaluation of return of shooting pain in the right leg with difficulty walking and went to the Clinic where she was started on Antibiotics (Bactrim x 7 days) and given pain meds, Norco as well as Valium for spasms with improvement 3 days later.  After a week symptoms resolved and she returned to baseline until just yesterday when she started having the shooting pain  and right leg spasm again which affects her walking.  She is still on Rebif and takes it regularly with no missed doses.    01/13/16:  She denies any new symptoms or exacerbation except worsening of fatigue after stopping high dose vitamin D. Her family doctor recommended taking OTC but that does not help.     07/15/15 Sandy:  Ms Nadene is here for evaluation of worsening symptoms.  She reports that she has noticed that she has increased trouble  walking when she gets up in the morning, she feels like she is a little of balance.  Once she has walked to the bathroom she is back to baseline.  She has also noticed that she has some pain in her right leg from halfway down her thigh to her calf which occurs every now and then for about 1 month. This has been going on for about 3 weeks, she otherwise has no other new symptoms.    01/08/15:  She thinks overall is stable, denies any new symptom or exacerbation. She received a steroid shot for knee pain which helped.      10/01/14:  I had the pleasure of evaluating Ms Greenfield who is here for evaluation of new symptoms.  She is on Rebif since 2004 and take is regulalry with no missed doses.  She developed new pain in her left knee and left leg, She also noticed increased numbness in that extremity which lasted about 3 days and it has started to improve.    04/09/14 Dr. Genelle Gather  On 4/2, she woke up and couldn't walk, felt her R leg was weak. Also lost sensation with tingling. She waited to see if this would get better. She called on 4/6 and Dr. Genelle Gather thought she is probably having exacerbation. Labs were done with CBC, BMP, UA which did not show evidence of infection. Nurse visited her at home to get IV access. IV solumedrol was started on 4/12. She got the second dose today. Since about Friday last week, sensation was coming back. Weakness is improved but not back to baseline. She is able to walk. Dancing is still hard. She is still on Rebif, doing ok with the injection. This is a first real exacerbation since 2004.   Recent exacerbation happened after recent URI.    02/27/13  She was working as OR Haematologist at [employer]. She was feeling very tired with some cognitive and memory problem around 2000. Thne she had an episode of visual change in both eye in 2004 with partial recovery. She was Dx with MS based on MRI. She was started on Rebif . She thinks it works and tolerating it well however does not like injection. Her  last MRI was from April 2013.   Last year she  lost 25 Lbs by diet and exercise after she was diagnosed with Diabetes but she regain that weight. She is planning to go back in diet this week.     I reviewed Dr. 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 Pt is currently on Rebif and is tolerating it well. She has not had an MRI since January 2012. Since pts last visit she feels her condition is getting worse. She is experiencing cognitive issues including concentration and memory issues. She has also started to urinate uncontrollably and has missed work as a result. Pt is having trouble walking due to pain in both knees and L hip pain. She also complains of stress at work which causes her to be depressed.   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 Pt is currently on Rebif and is tolerating it well. She has not had an MRI since January 2012. Since pts last visit she feels her condition is getting worse. She is experiencing cognitive issues including concentration and memory issues. She has also started to urinate uncontrollably and has missed work as a result. Pt is having trouble walking due to pain in both knees and L hip pain. She also complains of stress at work which causes her to be depressed.   Due to pts complaints of cognitive issues, we will refer her for a neuropsych eval. We will also give her the contact number to the social worker for the [facility]. I would like to obtain and MRI of the brain and c spine.    Review of System:      Depression  Because of safety concern live with her daughter which is stressful   Bladder incontinence   Nocturia   Weakness:  Walker since 2019  for long distance,   Pain:     R. Knee and hip, Steroid shot and Gabapentin   Sensory  Numbness r. foot  Fatigue:  bothersome  Sleep:        4 to 5 hrs uninterrupted,   Sleep Apnea:  No longer uses  CPAP    STM   Main symptom she get disability   Urinary Incontinece Oxybutinin helps, she has done pelvic exercise, this was one the reason she stopped working   Walking Difficulty Tired and back pain, can do more after exercise   Imbalance  Trip over but no fall   Fatigue                        Exhausted    Life Style:      Living Situation: With daughters family  Diet:  Diet  Exercise: Line Dance as exercise 4 days a week   Vitamin D: 50 K weekly     Medications:      Current Outpatient Medications   Medication Sig   ??? Accu-Chek Guide Glucose Meter    ??? albuterol Inhale 2 puffs into the lungs. Every 4 to 6 hours as needed.    ??? aspirin Chew 1 tablet by mouth daily.   ??? baclofen TAKE ONE TABLET BY MOUTH THREE TIMES A DAY   ??? benzonatate Take by mouth.   ??? blood sugar diagnostic Test blood sugars up to twice a day   ??? cholecalciferol (vitamin D3) TAKE ONE CAPSULE BY MOUTH EVERY 7 DAYS   ???  diclofenac sodium    ??? docusate sodium Take by mouth.   ??? famotidine Take by mouth.   ??? ibuprofen Take 1 tablet (800 mg total) by mouth every 8 hours as needed for Pain.   ??? inhalational spacing device by Misc.(Non-Drug; Combo Route) route. AEROCHAMBER PLUS.  Use with MDI    ??? ipratropium-albuteroL    ??? Jardiance    ??? lovastatin Take 40 mg by mouth daily with dinner.   ??? metFORMIN Take 1 tablet by mouth 2 times a day.   ??? metoprolol tartrate    ??? oxybutynin Take 1 tablet (10 mg total) by mouth daily. Indications: Urinary Urgency   ??? pantoprazole    ??? pioglitazone-metformin Take 1 tablet by mouth daily.   ??? pregabalin Take 1 capsule (100 mg total) by mouth 3 times a day. Indications: Take one capsule by mouth twice a day   ??? Rebif Rebidose Inject 44 mcg under the skin every Monday, Wednesday, and Friday.   ??? triamterene-hydrochlorothiazide Take 1 tablet by mouth daily.     No current facility-administered medications for this visit.      Allergies: Lisinopril, Compazine [prochlorperazine edisylate], and Prochlorperazine    History:       PMHx:  has a past medical history of Asthma, Diabetes mellitus (CMS Dx), Hypertension, Multiple sclerosis (CMS Dx), and Neuromuscular disorder (CMS Dx).     PSHx:  has a past surgical history that includes Knee surgery; Appendectomy; and Hysterectomy.    Social Hx: reports that she has never smoked. She has never used smokeless tobacco. She reports current alcohol use. She reports that she does not use drugs. She lives by herself. She feels she is safe but has to take extra time. Feels depressed being lonely. Grandkids lives cross the street and come and visit her. She goes to [facility] [employer], do dance club and try to stay active.     ZOX:WRUEAV history includes Alcohol abuse in her father and another family member; Brain Cancer in an other family member; COPD in her father; Cancer in her father, maternal grandmother, mother, and sister; Diabetes in her daughter, paternal aunt, paternal grandfather, and paternal uncle; Skin Cancer in an other family member; Stomach Cancer in an other family member; Throat Cancer in an other family member.    Physical Exam:    There were no vitals taken for this visit.   Limited exam through phone visit no significant change from last in person exam documented below:   Mental Status:Patient registered 3 objects without difficulty and recalled 3/3 after five minutes.  The patient was fully oriented and had a normal fund of knowledge.  Cranial Nerves: Color Vision12/12 OD and 10/12 OS, best corrected visual acuity was 20/20 OD and 20/20 OS.  There was no afferent pupillary deficit.  Funduscopic exam revealed normal optic nerve heads.   Horizontal and vertical eye movements were full.  There was no nystagmus.  Facial sensation was intact to pinprick.  There was no facial weakness.  Hearing was intact to finger rubbing bilaterally. Palate moved fully and symmetrically.  Tongue protruded in the midline.  There was no dysarthria.  Motor:   Strength:R-L (MRC Scale)   Deltoid  5 - 5   Hip Fx   4+  4(R. Hip limited by pain)  Biceps   5 - 5   Knee Ex  5 - 4   Triceps  5 - 5   Knee Fx  5 - 4   Wrist Ex  5 -  5   Plantar Fx 5 - 4   Grip         5 - 5   Ankle dorsiflex 5 - 4  Tone was normal  Reflexes: R / L (MRC Scale)   Biceps  2 / 2   Quadriceps 2 / 2   Triceps  2 / 2   Achilles  2 / 2   Brachoradialis 2 / 2   Toes   Up / down   Coordination: There was no ataxia on finger to nose or heel to shin testing.  Sensation: Decreased vibration BLE.     Position and pinprick sensation were intact in all four extremities.  Gait and station:  LE weakness, uses walker for long distance  25 foot walk 02/27/2013 04/09/2014 10/01/2014 01/08/2015 07/15/2015 01/13/2016 07/06/2017   Did the patient wear an ankle foot orthosis (AFO)? No No No No No No No   Was an assistive device used? No No No No No No No   Trial No. 1: Completed Completed Completed Completed Completed - -   Time for 25 Foot Walk: 6.06 secs 7.46 secs 8.26 secs 5.40 secs 5.81 secs 4.96 secs 6.21 secs      Tests:      05/01/18  MRI brain:  Unchanged moderate MS plaque burden when compared to 09/04/2015. No evidence for active demyelination.  Suspect superimposed chronic small vessel ischemic changes.  MRI cervical spine:  Slightly limited by motion. No focal abnormal cord signal or enhancement.  Mild degenerative changes, most pronounced at C4-C5 with facet arthropathy resulting in suspected bilateral neural foraminal narrowing.  MRI thoracic spine:  No definite abnormal spinal cord signal or enhancement.  Mild epidural lipomatosis, unchanged.    09/23/16   Cervical spine:  Unchanged minimal noncompressive disc bulges at C4-C5, and C5-C6 without spinal cord signal abnormalities or enhancement.   Fairly extensive signal abnormality seen in the pons which appears unchanged  Thoracic spine:  Limited evaluation of thoracic cord signal secondary to artifact, without definite cord signal abnormality.  Lumbar spine:  Multilevel degenerative changes of the lumbar spine as detailed,  most significant at L2-L3 and L3-L4 as detailed. There is moderate bilateral L3 and right L4 foraminal stenosis.  Enhancement along the facet joints at L3-L4 may represent synovitis. At this level, there is minimal grade 1 anterolisthesis of L3 on L4.    09/04/15 compare to 04/08/14  Brain:  1. Stable moderate plaque burden within the periventricular and subcortical white matter compatible with the history of MS. No new or active lesions.  Cervical Spine:  1. No cord signal abnormality or enhancing lesions.  Thoracic spine:  1.?? No definite cord signal abnormality or enhancing lesions. Motion degraded examination limits cord evaluation.    04/08/14 COMPARISON: MRI dated 03/27/2012.  MRI head:   1. No significant interval change in the moderate burden of T2 signal prolongation involving the periventricular, deep, and central pontine white matter consistent with the clinical diagnosis of multiple sclerosis. No new lesions, restricted diffusion, or enhancement is identified.   2. Small amount of layering fluid signal in the right maxillary sinus.         MRI cervical spine:   1. No evidence of cord signal abnormalities or enhancement.   2. Very mild multilevel degenerative discogenic changes without evidence of central canal or neuroforaminal stenosis, unchanged.         MRI thoracic spine:   1. No evidence of cord signal abnormalities or enhancement.  2. Very mild degenerative discogenic changes without evidence of central canal or neuroforaminal stenosis, involving the T3-T4 through T7-T8 levels.      Lab Results   Component Value Date    WBC 9.5 04/04/2018    HGB 15.1 04/04/2018    HCT 44.8 04/04/2018    MCV 91.0 04/04/2018    PLT 220 04/04/2018     Lab Results   Component Value Date    CREATININE 0.97 04/04/2018    BUN 21 04/04/2018    NA 136 04/04/2018    K 4.7 04/04/2018    CL 95 (L) 04/04/2018    CO2 29 04/04/2018     Lab Results   Component Value Date    ALT 58 (H) 04/04/2018    AST 60 (H) 04/02/2014    ALKPHOS 50  04/04/2018    BILITOT 0.3 04/04/2018     Lab Results   Component Value Date    VITD25H 100.0 04/04/2018     Lab Results   Component Value Date    VITAMINB12 359 04/04/2018     Lab Results   Component Value Date    HGBA1C 7.3 (H) 04/04/2018      Assessment/Plan:       Ms. Parkison is a 64 y.o. female with Multiple Sclerosis diagnosed in 2004 who has treated with Rebif eversince. She developed new numbness in 2020 in lower back concern about new MS disease activity.      1. Multiple sclerosis  - Continue Rebif  - I emphasized about importance of repeating MRI she promised will take care of it after taking care of her sick mother  - RTC in 6 mo     2. Neuropathic pain  - Pregabaline      3. Neuropathic pain  - Vitamin B12; Future  - Hemoglobin A1c; Future    4. Obesity, fatigue and spondylosis  - encourage weight loss, healthy diet and life style     5. Encounter for long-term (current) use of high-risk medication  - Differential; Future  - CBC; Future  - Comprehensive Metabolic Panel, Serum; Future     This was a Phone conversation, in lieu of an in-person visit. The patient provided verbal consent to participate in the telehealth visit.   I spent 15 minutes speaking with the patient, conducting an interview, performing a limited exam, and educating the patient on my assessment and plan. I also spent 5 minutes, on the same day as the encounter, preparing to see the patient (eg, review of tests), ordering medications, tests, or procedures, documenting clinical information in the electronic or other health record and performing non-face-to-face activities.       Number and Complexity of Problems (Level 4)  During this encounter, I addressed 1 or more chronic illnesses with exacerbation, progression, or side effects of treatment    Amount and/or Complexity of Data Ordered, Reviewed, or Analyzed (Level 4)  I ordered 3+ test(s) including:MRIs.    Risk of Complication and/or Morbidity or Mortality of Patient Management (Level   4)  The patient's management entails Moderate risk of complications and/or morbidity or mortality.    Overall LOS:  4      Raeanne Gathers, MD  Assistant Professor of Neurology   Director of [facility]   [address]   [place], [place] [address]????  [phone]

## 2020-06-11 NOTE — Unmapped (Signed)
Melanie [name] is calling stating she needs paperwork faxed stating that she has MS.    She will be calling back w fax#       Patient is going out of town and wants to get orders for ice packs that's why she needs this paperwork faxed.      Sula can be reached at [phone] (H)

## 2020-06-12 NOTE — Telephone Encounter (Signed)
Sent letter to Dr. Genelle Gather for signature and will fax it then.

## 2020-06-13 NOTE — Telephone Encounter (Signed)
Patient called back and was transferred to me. Patient states she needs a handicap placard. Told her I would get that ready for her but I wasn't sure I would be able to fax it today. Patient states that her daughter leaves work by 1:30 and it would be best if I could fax it before then. Explained that it is already 1310 and I wouldn't be able to fax it by 1330.     Told her I would call her when it's ready and she can let me know if it's ok to fax then. She verbalized understanding.     Will have Dr. Genelle Gather sign when next in clinic

## 2020-06-13 NOTE — Telephone Encounter (Signed)
Called patient and let her know I have the signed letter but need the fax #. Patient called her daughter and had her daughter provide her work Development worker, community. Says she wants it faxed there. Faxed the letter to [phone]

## 2020-06-18 NOTE — Telephone Encounter (Signed)
Called patient and let her know Handicap placard is signed. Patient states daughter isn't at her job now for me to fax it. Asked me to fax it tomorrow. Explained that I wouldn't be in tomorrow and would have someone else fax it. She asked me to mail it to her. Placed it in the mail to patient.

## 2020-07-24 ENCOUNTER — Ambulatory Visit: Payer: Medicare (Managed Care) | Attending: Neurology

## 2020-11-04 MED ORDER — REBIF REBIDOSE 44 mcg/0.5 mL PnIj
44 | SUBCUTANEOUS | 11 refills | Status: AC
Start: 2020-11-04 — End: 2020-12-31

## 2020-11-11 ENCOUNTER — Ambulatory Visit: Payer: Medicare (Managed Care) | Attending: Neurology

## 2020-11-22 NOTE — Unmapped (Signed)
COMPLETE

## 2020-11-22 NOTE — Unmapped (Signed)
I spoke with patient, missed last week appointment now calling for worsening of pain in LE for the last several days!   Recommended:  ?? CBC diff and UA/Cx to r/o infection as cause of pseudoexcerbation  ?? Schedule MRI which has been ordered in past but neve scheduled (noncomplaince)  ?? Extra dose of Lyrica did not help patient would like to go to ER for pain medication   ?? Patient needs follow-up app

## 2020-11-22 NOTE — Unmapped (Signed)
MRN: [id number]     Patient Name: Melanie [name]     Patient Date of Birth: 1956-09-27     Relationship of Caller to Patient: [phone]    Patient of: Pershing Cox of Call: PT IS HAVING AN ATTACK. PT LEFT LEG HAS SHOOTING PAIN AND THEIR SHOULDER HURTS A LITTLE AS WELL. PT HAS MS    On Call Provider Contacted: [email or web address]    Provider contacted via pager or cell     Time and Method:  CALL @1222PM     Caller advised to call back in 30 minutes, if they do not hear from the provider on-call.  Note was routed to the receiving on-call provider/pool.

## 2020-11-24 MED ORDER — pregabalin (LYRICA) 100 MG capsule
100 | ORAL_CAPSULE | ORAL | 3 refills | Status: AC
Start: 2020-11-24 — End: 2021-11-24

## 2020-11-24 MED ORDER — baclofen (LIORESAL) 10 MG tablet
10 | ORAL_TABLET | ORAL | 5 refills | Status: AC
Start: 2020-11-24 — End: ?

## 2020-11-25 NOTE — Unmapped (Signed)
Spoke with pt scheduled her first available for video visit.

## 2020-11-27 NOTE — Unmapped (Signed)
SW rc'd [employer] disablity form to complete. The request stated that in lieu of completing form past 3 ov notes could be sent.   SW faxed ov notes from 04/11/2019,11/06/2019 and 05/07/2020 . Faxed to [employer] [phone] attn Doyce Loose, claim # [id number].    Jon Gills, LISW-S  [phone]

## 2020-12-18 NOTE — Unmapped (Signed)
Please see previously closed encounter on 12.2.21    Page Spiro informed office notes faxed on 12.2.21 by SW  Patient verbalized understanding.

## 2020-12-31 MED ORDER — interferon beta-1a, albumin, (REBIF REBIDOSE) 44 mcg/0.5 mL PnIj
44 | SUBCUTANEOUS | 11 refills | Status: AC
Start: 2020-12-31 — End: 2021-01-05

## 2020-12-31 NOTE — Unmapped (Signed)
Pt is calling to inform the a denial letter was sent to office regarding Rebif. Pt states she needs for this denial to be disputed. Pt also states she requested a handicap placard script for 2 placards, to be mailed. The pt has a new address and it has been changed in the chart. Pls call the pt.

## 2020-12-31 NOTE — Unmapped (Signed)
Handicap placard printed sent to Dr Genelle Gather for signature.

## 2021-01-05 MED ORDER — interferon beta-1a, albumin, (REBIF REBIDOSE) 44 mcg/0.5 mL PnIj
44 | SUBCUTANEOUS | 11 refills | Status: AC
Start: 2021-01-05 — End: ?

## 2021-01-05 NOTE — Unmapped (Signed)
[facility] Note    A prescription for Rebif Rebidose was sent to [facility].     The patient prefers that the medication be filled at [facility].      A prior authorization was not needed, therefore the prescription was routed to above mentioned pharmacy.    Dierdre Forth, PharmD  Specialty Pharmacy  [phone]

## 2021-01-05 NOTE — Unmapped (Signed)
Spoke with pt advised handicap placed were mailed on 01/02/20.  Advised mail runs slow and could take a week or longer before pt receives them.  I also mailed another copy to pt home address today.

## 2021-01-05 NOTE — Unmapped (Signed)
Patient is requesting a call back today regarding her having the handicap placard sent to her house. Please mail to the address on file.    Please call the patient when this has been mailed to her    Pt-[phone]

## 2021-01-28 ENCOUNTER — Ambulatory Visit: Payer: Medicare (Managed Care) | Attending: Neurology

## 2021-02-06 ENCOUNTER — Ambulatory Visit: Admit: 2021-02-06 | Payer: Medicare (Managed Care)

## 2021-02-06 DIAGNOSIS — Z5181 Encounter for therapeutic drug level monitoring: Secondary | ICD-10-CM

## 2021-02-06 LAB — CBC
Hematocrit: 45.1 % (ref 35.0–45.0)
Hemoglobin: 14.8 g/dL (ref 11.7–15.5)
MCH: 28.6 pg (ref 27.0–33.0)
MCHC: 32.8 g/dL (ref 32.0–36.0)
MCV: 87.2 fL (ref 80.0–100.0)
MPV: 8.6 fL (ref 7.5–11.5)
Platelets: 249 10*3/uL (ref 140–400)
RBC: 5.17 10*6/uL (ref 3.80–5.10)
RDW: 14.4 % (ref 11.0–15.0)
WBC: 8.6 10*3/uL (ref 3.8–10.8)

## 2021-02-06 LAB — COMPREHENSIVE METABOLIC PANEL, SERUM
ALT: 48 U/L (ref 7–52)
AST (SGOT): 52 U/L (ref 13–39)
Albumin: 4.4 g/dL (ref 3.5–5.7)
Alkaline Phosphatase: 58 U/L (ref 36–125)
Anion Gap: 10 mmol/L (ref 3–16)
BUN: 17 mg/dL (ref 7–25)
CO2: 32 mmol/L (ref 21–33)
Calcium: 10.1 mg/dL (ref 8.6–10.3)
Chloride: 97 mmol/L (ref 98–110)
Creatinine: 0.82 mg/dL (ref 0.60–1.30)
Glucose: 96 mg/dL (ref 70–100)
Osmolality, Calculated: 289 mOsm/kg (ref 278–305)
Potassium: 3.6 mmol/L (ref 3.5–5.3)
Sodium: 139 mmol/L (ref 133–146)
Total Bilirubin: 0.3 mg/dL (ref 0.0–1.5)
Total Protein: 8.6 g/dL (ref 6.4–8.9)
eGFR AA CKD-EPI: 88 See note.
eGFR NONAA CKD-EPI: 76 See note.

## 2021-02-06 LAB — DIFFERENTIAL
Basophils Absolute: 396 /uL — ABNORMAL HIGH (ref 0–200)
Basophils Relative: 4.6 % — ABNORMAL HIGH (ref 0.0–1.0)
Eosinophils Absolute: 95 /uL (ref 15–500)
Eosinophils Relative: 1.1 % (ref 0.0–8.0)
Lymphocytes Absolute: 1410 /uL (ref 850–3900)
Lymphocytes Relative: 16.4 % (ref 15.0–45.0)
Monocytes Absolute: 662 /uL (ref 200–950)
Monocytes Relative: 7.7 % (ref 0.0–12.0)
Neutrophils Absolute: 6037 /uL (ref 1500–7800)
Neutrophils Relative: 70.2 % (ref 40.0–80.0)
nRBC: 0 /100{WBCs} (ref 0–0)

## 2021-02-06 LAB — URINALYSIS W/RFL TO MICROSCOPIC
Bilirubin, UA: NEGATIVE
Glucose, UA: >=500 mg/dL
Ketones, UA: NEGATIVE mg/dL
Nitrite, UA: NEGATIVE
Protein, UA: 100 mg/dL
RBC, UA: 9 /HPF (ref 0–3)
Specific Gravity, UA: 1.032 (ref 1.005–1.035)
Squam Epithel, UA: 2 /HPF (ref 0–5)
Urobilinogen, UA: <2 mg/dL (ref 0.2–1.9)
WBC, UA: 9 /HPF (ref 0–5)
pH, UA: 5 (ref 5.0–8.0)

## 2021-02-06 LAB — VITAMIN D 25 HYDROXY: Vit D, 25-Hydroxy: 103 ng/mL (ref 30.0–100.0)

## 2021-02-06 LAB — VITAMIN B12: Vitamin B-12: >1506 pg/mL (ref 180–914)

## 2021-02-06 LAB — URINE CULTURE

## 2021-02-06 NOTE — Unmapped (Signed)
Patient is requesting to speak with Dr. Genelle Gather requesting an MRI to be ordered. Patient states her vision in her right eye is very blurry and could not see the eye chart at the eye doctor. Patient is also requesting an emergency video visit.    Patient-[phone]

## 2021-02-06 NOTE — Unmapped (Signed)
She has a very mild stable MS for many years and risk of MS relapse is very low at her age. Before anything she needs blood work and UA/Cx to r/o infection as it can cause pseudo-exacerbation. If no sign of infection then consider MRI or steroid based on how she feels on Monday.   I can not do a full eye exam in-person or virtually so if she gets worse she needs to go to [facility] walk-in clinic.

## 2021-02-06 NOTE — Unmapped (Addendum)
Patient states she went to see the eye doctor for her yearly exam and they completed a diabetic exam. The eye doctor thinks patient is having an MS attack due to her worsening vision 75/25  Patients right eye is affected.  Patient was referred to a specialist at [facility] Dr. Titus Dubin her appt is on April 14th. Patient herself has not noticed any visual changes. Patient has an order for MRI Imaging back from May 2021 that she never completed, however patient requesting a new stat order to be placed due to no availability to get MRI's complete until March 2022.

## 2021-02-06 NOTE — Unmapped (Signed)
Patient states she went for a yearly follow up to her eye doctor. Patient states eye doctor states concerns for MS flair due to worsening noted on eye work up with 75/25 Vision. Patient Called back with MD recommendations of Lab work/urine studies. Patient states she will try to get done today. Address of UC lab sent to patients email per request. Patient educated to visit [facility] for urgent eye evaluation for any worsening of vision.     When did it start? Patient states she has near sited vision which is normal for patient. Patient has reported not noticing any difference in vision, states this was only reported to her by her eye dr.      Since it started is it overall getting better, worse or staying the same? Vision is now worse than evaluation from last year.      Have you had similar or same symptoms before? No     If so, are symptoms worse than they were before? N/A     Any vision changes? No     How severe are the symptoms on a scale of 0-10, 0 being no pain, 10 being the worst pain? No pain is reported.      How do they impact your function, like are you unable to do normal activities? Patients eye dr. Lenna Gilford new glasses for patient which she does not want to order. Eye dr. Catalina Pizza patient she should not drive.      Any infectious symptoms- change in urinary frequency, urgency, color or odor in your urine, congestion, fever, cough? Odor to urine but states per usual for self due to potassium tablets.      For headaches- any associated symptoms - change in vision, nausea, vomiting, runny nose, watery eyes? N/A

## 2021-02-09 LAB — DIABETIC RETINOPATHY EXAM
Cataracts: POSITIVE
Diabetic Retinopathy: POSITIVE
Glaucoma: POSITIVE
Intraocular Pressure Left Eye: 13
Intraocular Pressure Right Eye: 14

## 2021-02-09 NOTE — Unmapped (Signed)
01/28/21 no show to vv with Dr. Genelle Gather  11/11/20 no show to in person visit with Dr. Genelle Gather  Last OV was on 05/07/20 with Dr. Genelle Gather: tele visit  11/06/19 was a televisit with Dr. Genelle Gather    02/06/21 labs:  - UC was -  - Vit B12   Vitamin B-12 180 - 914 pg/mL >1506??High??     Vit D, 25-Hydroxy 30.0 - 100.0 ng/mL 103.0??High??      Discussed:  - She has not been taking Vit B for a few weeks.  - Vit D-takes a weekly so should see have a supplement called in for  - Moving out of town in 3 weeks to [place], [place] after visiting her daughter in 2 weeks? Informed that if she moves out of town she will need to establish with a new neuro MD who will rx rebif for her.  - Seen for an eye exam at [facility] on 02/05/21. Called [phone] and requested that this be faxed to [phone] so that neuro md can review.  - Patient being seen by Dr. Eudelia Bunch today at [facility] for a diabetic eye exam. ([phone])).  - Patient is at [facility] at the time of the call obtaining new glasses.  - Reviewed Missed/no show OVs with Dr. Genelle Gather. Patient stated that she did not know about the visits, no one called her to remind her. Attempted to make an OV appt for her but she was in the middle of picking out glasses and fitting.

## 2021-02-09 NOTE — Unmapped (Signed)
Noted  

## 2021-02-09 NOTE — Unmapped (Signed)
02/05/21 eye exam results from [facility] received and this is a part of [employer].  Scanned to media and route to Dr. Genelle Gather.    Herma Ard, BSN, RN  RN Care Coordinator  [facility]  [address], [address] Floor  [place], [place] [address]  [phone]

## 2021-02-09 NOTE — Unmapped (Signed)
Labs showed no sign of infection. She needs to cut back down on vatimin B12 and D supplement.   If symptoms continue to decline will consider steroid.   She was no show on recent visit and has not schedule any follow-up appointment yet! She is also over due for MRIs.

## 2021-02-11 NOTE — Unmapped (Signed)
Patient is requesting to speak with Rachell    Patient-[phone]

## 2021-02-11 NOTE — Unmapped (Signed)
Patient called back x2 and she did not answer. Left a voicemail requesting a call back. I have also tried to call [facility] which is closed at this time.

## 2021-02-11 NOTE — Unmapped (Addendum)
Patient called into the clinic today requesting a stat MRI. Patient was advised to schedule an MRI through calling [phone]. Explained to patient that her MRI would not be scheduled STAT. Patient states she went to the  Eye dr at [facility] center. Patient stated they found some swelling however could not elaborate on what was swollen. I requested that Patient can call and give her permission and ask [facility] center to fax over her exam. Fax number given to patient. Awaiting fax at this time. Patient Completed labs and Urine Studies. Patient states she has a headache today and this is the first time it has occurred. Patient states she has not taken anything for the headache but plans to get some tylenol. No other pain reported at this time. No other s/s of infection reported.

## 2021-02-11 NOTE — Unmapped (Signed)
Patient called back. Patient needing fax number to send an eye exam from [facility] eye. Fax number given.

## 2021-02-12 NOTE — Unmapped (Signed)
[facility] center and requested a copy of patients most recent eye exam on 02/10/21.

## 2021-02-12 NOTE — Unmapped (Signed)
Called [facility] and spoke with Juliette Alcide who faxed over patients exam from her second opinion at [facility].     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     DM without significant Diabetic retinopathy and no clinical Diabetic Macular Edema  noted in both eyes.     Mild Cataract both eyes    Hx Ms associated optic neuritis left eye    Mild periph ischemia both eyes    DX: Bilateral Nuclear Sclerosis Cataracts    Plan: Diagnostic Tests, Monitor mild cataracts, referral for Vitreoretinal Surgical Consult for the Macular Hole in right eye.

## 2021-02-12 NOTE — Unmapped (Signed)
Patient is requesting to speak with Rachell regarding the eye exam    Patient-[phone]

## 2021-02-12 NOTE — Unmapped (Signed)
Patient called. Patient stated she went to [facility] for a second opinion of her eyes. Patient states [facility] will be faxing the exam to me today. Awaiting fax at this time.

## 2021-02-12 NOTE — Unmapped (Signed)
Patient called this morning who states she will call [facility] to request that her eye exam be sent to our clinic asap. Patient states [facility] stated they would send yesterday. Exam has not been received at this time.

## 2021-02-12 NOTE — Unmapped (Signed)
Patient called into clinic stating she got a second opinion today at the [facility]. Patient states her previous exam at the [facility] was no correct. Patient states [facility] will fax the exam over to our office. Awaiting exam results at this time.

## 2021-02-12 NOTE — Unmapped (Signed)
Patients eye exam from [facility] received. Results/Plan. Patient evaluated by Eudelia Bunch OD.     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     2.Clinically Significant Macular Edema to right eye. Recommends retinal evaluation and treatment with intravitreal injection. Patient stated she was leaving town in 2 weeks requested next available evaluation and treatment of condition.     3.Suspicion of Glaucoma, High Risk to both eyes. Moderate cupping of both eyes. Discussed Risk and benefits of Minimally invasive Glaucoma surgery.     4.Stable Demyelinating Optic Neuropathy. Patient reported possible episode of neuritis at onset of diagnosis.     5.Worsening Nuclear Sclerosis both eyes. The cataracts are partially responsible for patients decrease in vision. Discussed potential cataract surgery in the near future once Diabetic Macular Edema addressed and evaluation of glaucoma suspicion.     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 Patient has HTN and Diabetes    7.High Myopia both eyes.     Follow up in 4-6 weeks to evaluate CSME OD, IOP/Pach check

## 2021-02-13 NOTE — Unmapped (Signed)
Patient notified that MD has reviewed her exams from [facility] and [facility]. Patient states she has not scheduled her MRI Imaging yet and will call to schedule it now.

## 2021-02-13 NOTE — Unmapped (Signed)
Noted  

## 2021-03-09 MED ORDER — diazePAM (VALIUM) 5 MG tablet
5 | ORAL_TABLET | ORAL | 0 refills | Status: AC | PRN
Start: 2021-03-09 — End: 2021-04-14

## 2021-03-09 NOTE — Unmapped (Signed)
Valium Rx sent to [facility]

## 2021-03-09 NOTE — Unmapped (Signed)
-----   Message from Raeanne Gathers, MD sent at 03/09/2021  9:14 AM EDT -----  I don't this this procedure is contraindicated for MRI but should tell radiology technician too. I sent Valium Rx to [facility].    Aram  ----- Message -----  From: Kirsti Mcalpine A. Annabella Elford, RN  Sent: 03/06/2021   4:55 PM EDT  To: Raeanne Gathers, MD    Patient had a retina tear and had it repaired 02/20/21. Patient has a bubble repair and has a MRI imagining scheduled for Tuesday and Friday. Is it ok for her to proceed with MRI? If so can she have a valium script for MRI's for Tuesday and Friday?

## 2021-03-09 NOTE — Unmapped (Signed)
Patient called and given MD recommendations. Patient states understanding and will notify radiology tech of retina repair. Patient notified that valium was called in to her pharmacy on file. Patient states understanding.

## 2021-03-10 NOTE — Unmapped (Signed)
Melanie [name] states she had to cancel her MRI's schedule for tomorrow and Friday   Patient had eye surgery and has a bubble in her eye and will need to postpone getting her MRI until she has healed     Patient can be reached at [phone] (H)

## 2021-03-11 ENCOUNTER — Ambulatory Visit: Payer: Medicare (Managed Care)

## 2021-03-11 NOTE — Unmapped (Signed)
Notes, agreed

## 2021-03-11 NOTE — Unmapped (Signed)
Patient called and states she called her eye dr. Who would like her to schedule her MRI imaging after her retina tear heals due to not being able to lay flat. Patient has follow up with eye dr. On 03/20/21 then will schedule MRI imaging.

## 2021-03-13 ENCOUNTER — Ambulatory Visit: Payer: Medicare (Managed Care)

## 2021-04-07 NOTE — Unmapped (Signed)
Pt is calling to request a script for Ativan to be sent to the [facility] pharmacy on [facility] due to being claustrophobic. Pt is scheduled for 3 MRIs.

## 2021-04-07 NOTE — Unmapped (Signed)
Spoke with pharmacist script on file for pt. Script will be filled today.

## 2021-04-13 DIAGNOSIS — G35 Multiple sclerosis: Secondary | ICD-10-CM

## 2021-04-13 MED ORDER — gadobutrol (GADAVIST) 1 mmol/1 mL IV solution 10 mL
1 | Freq: Once | INTRAVENOUS | Status: AC | PRN
Start: 2021-04-13 — End: 2021-04-13
  Administered 2021-04-14: 02:00:00 10 mL/kg via INTRAVENOUS

## 2021-04-14 ENCOUNTER — Inpatient Hospital Stay: Admit: 2021-04-14 | Discharge: 2021-04-17 | Payer: Medicare (Managed Care) | Attending: Neurology

## 2021-04-14 MED ORDER — diazePAM (VALIUM) 5 MG tablet
5 | ORAL_TABLET | ORAL | 0 refills | Status: AC | PRN
Start: 2021-04-14 — End: 2021-04-15

## 2021-04-14 NOTE — Unmapped (Signed)
Pt is calling to request another script of Ativan to be sent to [facility] pharmacy on file. Pt states she is scheduled for the other MRIs tomorrow.

## 2021-04-14 NOTE — Unmapped (Signed)
Spoke with pt advised script was sent to pharmacy.

## 2021-04-14 NOTE — Unmapped (Signed)
Ativan Rx

## 2021-04-15 ENCOUNTER — Ambulatory Visit: Payer: Medicare (Managed Care)

## 2021-04-15 DIAGNOSIS — G35 Multiple sclerosis: Secondary | ICD-10-CM

## 2021-04-15 MED ORDER — gadobutrol (GADAVIST) 1 mmol/1 mL IV solution 10 mL
1 | Freq: Once | INTRAVENOUS | Status: AC | PRN
Start: 2021-04-15 — End: 2021-04-15
  Administered 2021-04-16: 01:00:00 10 mL/kg via INTRAVENOUS

## 2021-04-16 ENCOUNTER — Inpatient Hospital Stay: Admit: 2021-04-16 | Payer: Medicare (Managed Care) | Attending: Neurology

## 2021-04-17 ENCOUNTER — Ambulatory Visit: Payer: Medicare (Managed Care)

## 2021-08-31 ENCOUNTER — Emergency Department (HOSPITAL_COMMUNITY): Payer: Medicare HMO

## 2021-08-31 ENCOUNTER — Other Ambulatory Visit: Payer: Self-pay

## 2021-08-31 ENCOUNTER — Encounter (HOSPITAL_COMMUNITY): Payer: Self-pay | Admitting: *Deleted

## 2021-08-31 ENCOUNTER — Emergency Department (HOSPITAL_COMMUNITY)
Admission: EM | Admit: 2021-08-31 | Discharge: 2021-08-31 | Disposition: A | Discharge status: Home / Self Care | Payer: Medicare HMO | Attending: Emergency Medicine | Admitting: Emergency Medicine

## 2021-08-31 DIAGNOSIS — M25551 Pain in right hip: Secondary | ICD-10-CM | POA: Insufficient documentation

## 2021-08-31 DIAGNOSIS — Z7982 Long term (current) use of aspirin: Secondary | ICD-10-CM | POA: Diagnosis not present

## 2021-08-31 DIAGNOSIS — Z7984 Long term (current) use of oral hypoglycemic drugs: Secondary | ICD-10-CM | POA: Diagnosis not present

## 2021-08-31 DIAGNOSIS — Z85828 Personal history of other malignant neoplasm of skin: Secondary | ICD-10-CM | POA: Diagnosis not present

## 2021-08-31 DIAGNOSIS — E119 Type 2 diabetes mellitus without complications: Secondary | ICD-10-CM | POA: Diagnosis not present

## 2021-08-31 DIAGNOSIS — G35 Multiple sclerosis: Secondary | ICD-10-CM | POA: Insufficient documentation

## 2021-08-31 DIAGNOSIS — J45909 Unspecified asthma, uncomplicated: Secondary | ICD-10-CM | POA: Diagnosis not present

## 2021-08-31 HISTORY — DX: Malignant (primary) neoplasm, unspecified: C80.1

## 2021-08-31 HISTORY — DX: Fibromyalgia: M79.7

## 2021-08-31 HISTORY — DX: Multiple sclerosis, unspecified: G35.D

## 2021-08-31 HISTORY — DX: Multiple sclerosis: G35

## 2021-08-31 HISTORY — DX: Unspecified asthma, uncomplicated: J45.909

## 2021-08-31 HISTORY — DX: Type 2 diabetes mellitus without complications: E11.9

## 2021-08-31 LAB — CBC WITH DIFFERENTIAL/PLATELET
Abs Immature Granulocytes: 0.03 10*3/uL (ref 0.00–0.07)
Basophils Absolute: 0 10*3/uL (ref 0.0–0.1)
Basophils Relative: 0 %
Eosinophils Absolute: 0.1 10*3/uL (ref 0.0–0.5)
Eosinophils Relative: 1 %
HCT: 45.3 % (ref 36.0–46.0)
Hemoglobin: 14.8 g/dL (ref 12.0–15.0)
Immature Granulocytes: 0 %
Lymphocytes Relative: 24 %
Lymphs Abs: 2.2 10*3/uL (ref 0.7–4.0)
MCH: 29.6 pg (ref 26.0–34.0)
MCHC: 32.7 g/dL (ref 30.0–36.0)
MCV: 90.6 fL (ref 80.0–100.0)
Monocytes Absolute: 0.7 10*3/uL (ref 0.1–1.0)
Monocytes Relative: 8 %
Neutro Abs: 5.9 10*3/uL (ref 1.7–7.7)
Neutrophils Relative %: 67 %
Platelets: 248 10*3/uL (ref 150–400)
RBC: 5 MIL/uL (ref 3.87–5.11)
RDW: 14.4 % (ref 11.5–15.5)
WBC: 9 10*3/uL (ref 4.0–10.5)
nRBC: 0 % (ref 0.0–0.2)

## 2021-08-31 LAB — COMPREHENSIVE METABOLIC PANEL
ALT: 65 U/L — ABNORMAL HIGH (ref 0–44)
AST: 61 U/L — ABNORMAL HIGH (ref 15–41)
Albumin: 3.4 g/dL — ABNORMAL LOW (ref 3.5–5.0)
Alkaline Phosphatase: 62 U/L (ref 38–126)
Anion gap: 11 (ref 5–15)
BUN: 19 mg/dL (ref 8–23)
CO2: 27 mmol/L (ref 22–32)
Calcium: 9.3 mg/dL (ref 8.9–10.3)
Chloride: 100 mmol/L (ref 98–111)
Creatinine, Ser: 0.95 mg/dL (ref 0.44–1.00)
GFR, Estimated: >60 mL/min (ref >60)
Glucose, Bld: 132 mg/dL — ABNORMAL HIGH (ref 70–99)
Potassium: 3.4 mmol/L — ABNORMAL LOW (ref 3.5–5.1)
Sodium: 138 mmol/L (ref 135–145)
Total Bilirubin: 0.6 mg/dL (ref 0.3–1.2)
Total Protein: 7.6 g/dL (ref 6.5–8.1)

## 2021-08-31 LAB — URINALYSIS, ROUTINE W REFLEX MICROSCOPIC
Bacteria, UA: NONE SEEN
Bilirubin Urine: NEGATIVE
Glucose, UA: >=500 mg/dL — AB
Ketones, ur: NEGATIVE mg/dL
Leukocytes,Ua: NEGATIVE
Nitrite: NEGATIVE
Protein, ur: NEGATIVE mg/dL
Specific Gravity, Urine: 1.028 (ref 1.005–1.030)
pH: 5 (ref 5.0–8.0)

## 2021-08-31 MED ORDER — PREDNISONE 20 MG PO TABS: 40.0000 mg | ORAL_TABLET | Freq: Every day | ORAL | 0 refills | Status: AC

## 2021-08-31 MED ORDER — PREDNISONE 20 MG PO TABS
60.0000 mg | ORAL_TABLET | Freq: Once | ORAL | Status: AC
  Administered 2021-08-31: 60 mg via ORAL
  Filled 2021-08-31: qty 3

## 2021-08-31 MED ORDER — OXYCODONE-ACETAMINOPHEN 5-325 MG PO TABS
1.0000 | ORAL_TABLET | Freq: Once | ORAL | Status: AC
  Administered 2021-08-31: 1 via ORAL
  Filled 2021-08-31: qty 1

## 2021-08-31 MED ORDER — OXYCODONE-ACETAMINOPHEN 5-325 MG PO TABS: 1.0000 | ORAL_TABLET | Freq: Four times a day (QID) | ORAL | 0 refills | Status: DC | PRN

## 2021-08-31 NOTE — Discharge Instructions (Addendum)
All you labs looked good today.  The x-ray showed a lot of arthritis which may be contributing to the pain.

## 2021-08-31 NOTE — ED Triage Notes (Signed)
Pt reports MS flare up starting yesterday with uncontrolled itching

## 2021-08-31 NOTE — ED Notes (Signed)
Pt discharged and wheeled out of the ED in a wheel chair without difficulty. 

## 2021-08-31 NOTE — ED Provider Notes (Signed)
[facility] EMERGENCY DEPARTMENT Provider Note   CSN: [id number] Arrival date & time: 08/31/21  [id number]     History Chief Complaint  Patient presents with   Multiple Sclerosis    Alisha Martinez is a 65 y.o. female.  Patient is a 65 year old female with a history of MS, diabetes, asthma and fibromyalgia who is currently on interferon therapy 3 times a week who is presenting today with complaints of generalized itching and body tingling that started most significantly yesterday.  There is some improvement today but it is persistent.  She is concerned she may be having an MS flare as it has presented like this in the past.  She is also having pain in her right hip with some numbness in the thigh.  She cannot recall quite how long this has been going on.  She does report having a fall in July and having some pain in her hip then and she had an MRI that showed that she was having an MS flare.  She does not feel like it has gone back to normal since that time but denies any further falls.  She has not noticed any rashes or used any new products that could be causing an allergic reaction.  She recently moved here and reports getting her prior care in [place] and thinks the last MRI she had was at the [facility] but cannot remember.  She is currently taking all of her medications as prescribed.  She denies any recent cough, congestion, fever, abdominal pain, nausea vomiting or diarrhea.  The history is provided by the patient.      Past Medical History:  Diagnosis Date   Asthma    Cancer ([place])    Diabetes mellitus without complication ([place])    Fibromyalgia    MS (multiple sclerosis) ([place])     There are no problems to display for this patient.      OB History   No obstetric history on file.     No family history on file.  Social History   Tobacco Use   Smoking status: Never  Substance Use Topics   Alcohol use: Yes   Drug use: Never    Home  Medications Prior to Admission medications   Medication Sig Start Date End Date Taking? Authorizing Provider  aspirin 81 MG chewable tablet Chew 81 mg by mouth daily.   Yes [provider]  baclofen (LIORESAL) 10 MG tablet Take 10 mg by mouth 3 (three) times daily.   Yes [provider]  chlorthalidone (HYGROTON) 25 MG tablet Take 25 mg by mouth daily.   Yes [provider]  empagliflozin (JARDIANCE) 10 MG TABS tablet Take 10 mg by mouth daily.   Yes [provider]  ergocalciferol (VITAMIN D2) 1.25 MG (50000 UT) capsule Take 50,000 Units by mouth every Friday.   Yes [provider]  interferon beta-1a (REBIF) 44 MCG/0.5ML SOSY injection Inject 44 mcg into the skin every Monday, Wednesday, and Friday.   Yes [provider]  lovastatin (MEVACOR) 40 MG tablet Take 40 mg by mouth at bedtime.   Yes [provider]  metFORMIN (GLUCOPHAGE) 1000 MG tablet Take 1,000 mg by mouth 2 (two) times daily with a meal.   Yes [provider]  oxybutynin (DITROPAN-XL) 10 MG 24 hr tablet Take 10 mg by mouth at bedtime.   Yes [provider]  oxyCODONE-acetaminophen (PERCOCET/ROXICET) 5-325 MG tablet Take 1 tablet by mouth every 6 (six) hours as  needed for severe pain. 08/31/21  Yes Zayla Agar, Loree Fee, MD  pantoprazole (PROTONIX) 40 MG tablet Take 40 mg by mouth daily.   Yes [provider]  potassium chloride (KLOR-CON) 10 MEQ tablet Take 10 mEq by mouth 2 (two) times daily.   Yes [provider]  prednisoLONE acetate (PRED FORTE) 1 % ophthalmic suspension Place 1 drop into the left eye 4 (four) times daily.   Yes [provider]  predniSONE (DELTASONE) 20 MG tablet Take 2 tablets (40 mg total) by mouth daily for 7 days. 08/31/21 09/07/21 Yes Cheria Sadiq, Loree Fee, MD  pregabalin (LYRICA) 100 MG capsule Take 100 mg by mouth 3 (three) times daily.   Yes [provider]    Allergies    Compazine  [prochlorperazine] and Lisinopril  Review of Systems   Review of Systems  All other systems reviewed and are negative.  Physical Exam Updated Vital Signs BP 132/86   Pulse 86   Temp 98.3 F (36.8 C) (Oral)   Resp 18   SpO2 98%   Physical Exam Vitals and nursing note reviewed.  Constitutional:      General: She is not in acute distress.    Appearance: Normal appearance. She is well-developed.  HENT:     Head: Normocephalic and atraumatic.  Eyes:     Pupils: Pupils are equal, round, and reactive to light.  Cardiovascular:     Rate and Rhythm: Normal rate and regular rhythm.     Heart sounds: Normal heart sounds. No murmur heard.   No friction rub.  Pulmonary:     Effort: Pulmonary effort is normal.     Breath sounds: Normal breath sounds. No wheezing or rales.  Abdominal:     General: Bowel sounds are normal. There is no distension.     Palpations: Abdomen is soft.     Tenderness: There is no abdominal tenderness. There is no guarding or rebound.  Musculoskeletal:        General: Tenderness present. Normal range of motion.     Cervical back: Normal range of motion and neck supple.     Comments: No edema.  Mild pain with palpation over the right medial thigh.  Normal range of motion but pain with ROM.  No leg swelling and right DP pulse intact.  Cap refill <2sec.  Sensation intact except for subjective numbness over the right medial thigh  Skin:    General: Skin is warm and dry.     Findings: No rash.  Neurological:     General: No focal deficit present.     Mental Status: She is alert and oriented to person, place, and time.     Cranial Nerves: No cranial nerve deficit.     Sensory: No sensory deficit.     Motor: No weakness.     Gait: Gait normal.     Comments: 5/5 strength in bilateral upper and lower ext.  No pronator drift or gait disturbance.  Psychiatric:        Mood and Affect: Mood normal.        Behavior: Behavior normal.    ED Results / Procedures /  Treatments   Labs (all labs ordered are listed, but only abnormal results are displayed) Labs Reviewed  COMPREHENSIVE METABOLIC PANEL - Abnormal; Notable for the following components:      Result Value   Potassium 3.4 (*)    Glucose, Bld 132 (*)    Albumin 3.4 (*)    AST 61 (*)  ALT 65 (*)    All other components within normal limits  URINALYSIS, ROUTINE W REFLEX MICROSCOPIC - Abnormal; Notable for the following components:   Glucose, UA >=500 (*)    Hgb urine dipstick MODERATE (*)    All other components within normal limits  CBC WITH DIFFERENTIAL/PLATELET    EKG None  Radiology DG Hip Unilat W or Wo Pelvis 2-3 Views Right  Result Date: 08/31/2021 CLINICAL DATA:  Right hip pain.  No known injury. EXAM: DG HIP (WITH OR WITHOUT PELVIS) 2-3V RIGHT COMPARISON:  None. FINDINGS: Advanced degenerative changes within the right hip with complete joint space loss and spurring. Moderate degenerative changes in the left hip. SI joints symmetric and unremarkable. No acute bony abnormality. Specifically, no fracture, subluxation, or dislocation. 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 No acute bony abnormality. Electronically Signed   By: Rolm Baptise M.D.   On: 08/31/2021 03:59    Procedures Procedures   Medications Ordered in ED Medications  predniSONE (DELTASONE) tablet 60 mg (60 mg Oral Given 08/31/21 0402)  oxyCODONE-acetaminophen (PERCOCET/ROXICET) 5-325 MG per tablet 1 tablet (1 tablet Oral Given 08/31/21 0402)    ED Course  I have reviewed the triage vital signs and the nursing notes.  Pertinent labs & imaging results that were available during my care of the patient were reviewed by me and considered in my medical decision making (see chart for details).    MDM Rules/Calculators/A&P                           65 year old female presenting today with complaints of generalized itching and tingling in her body.  She is on  interferon therapy 3 times a week for MS.  Well-appearing here.  No evidence of rashes or concern for allergic reaction at this time.  Patient reports that she has had this in the past with MS flares.  She has not received any of her medical care here and last had MRIs done at a hospital in [place] but she is not sure which one.  Unable to pull up anything from care everywhere.  Patient is having no focal deficits today.  She is also complaining of hip pain that started after a fall but again cannot be more specific on details.  We will get plain imaging of the hip.  Will give prednisone and pain control.  Patient will need to establish care in this area with a neurologist.  Labs are pending.   5:00 AM UA without significant findings today.  CBC within normal limits, CMP with minimal LFT elevation with an AST of 61 and ALT of 65 but no other acute findings.  This will need to be followed by PCP.  Hip imaging shows moderate to advanced degenerative changes in the hips right greater than left no other acute abnormality.  Findings discussed with the patient.  Will discharge home on pain control and steroids.  Discussed follow-up here in this area with specialist and establishing care.  She was given information.  Otherwise appears well and stable for discharge  MDM   Amount and/or Complexity of Data Reviewed Clinical lab tests: ordered and reviewed Tests in the radiology section of CPT: ordered and reviewed Independent visualization of images, tracings, or specimens: yes    Final Clinical Impression(s) / ED Diagnoses Final diagnoses:  Multiple sclerosis exacerbation ([place])  Right hip pain    Rx / DC Orders ED  Discharge Orders          Ordered    oxyCODONE-acetaminophen (PERCOCET/ROXICET) 5-325 MG tablet  Every 6 hours PRN        08/31/21 0457    predniSONE (DELTASONE) 20 MG tablet  Daily        08/31/21 0457             Blanchie Dessert, MD 08/31/21 0502

## 2021-10-26 ENCOUNTER — Encounter: Payer: Self-pay | Admitting: Neurology

## 2021-10-26 ENCOUNTER — Telehealth: Payer: Self-pay | Admitting: *Deleted

## 2021-10-26 ENCOUNTER — Ambulatory Visit: Payer: Medicare HMO | Admitting: Neurology

## 2021-10-26 VITALS — BP 123/79 | HR 78 | Ht 64.0 in | Wt 216.0 lb

## 2021-10-26 DIAGNOSIS — R39198 Other difficulties with micturition: Secondary | ICD-10-CM | POA: Diagnosis not present

## 2021-10-26 DIAGNOSIS — G35 Multiple sclerosis: Secondary | ICD-10-CM | POA: Diagnosis not present

## 2021-10-26 DIAGNOSIS — R200 Anesthesia of skin: Secondary | ICD-10-CM | POA: Diagnosis not present

## 2021-10-26 DIAGNOSIS — M79605 Pain in left leg: Secondary | ICD-10-CM

## 2021-10-26 DIAGNOSIS — M79604 Pain in right leg: Secondary | ICD-10-CM | POA: Diagnosis not present

## 2021-10-26 MED ORDER — MIRABEGRON ER 50 MG PO TB24: 50.0000 mg | ORAL_TABLET | Freq: Every day | ORAL | 5 refills | Status: DC

## 2021-10-26 NOTE — Progress Notes (Signed)
[facility]  PATIENT: Alisha Martinez DOB: 07-26-1956  REFERRING DOCTOR OR PCP: Kristie Cowman, MD SOURCE: Patient, notes from [facility] neurology, imaging and lab reports  _________________________________   HISTORICAL  CHIEF COMPLAINT:  Chief Complaint  Patient presents with   New Patient (Initial Visit)    RM 2. Paper referral for MS. Pt reports she is here for TOC. Pt has had a flare up since moving in April from [place]. Pt has been going back to [facility] for eye surgery.     HISTORY OF PRESENT ILLNESS:  I had the pleasure of seeing your patient, Angle Dirusso, at [facility] for neurologic consultation regarding her diagnosis of MS.  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   She needed to rest much more between tasks.   Over the previous 2 years, she also had issues with short term memory and tingling in her limbs.   She had MRI's in 2004 that were concerning for MS.  She was diagnosed in [place] at the [facility].    She never had an LP.   She was started on Rebif and has tolerated it well.  She has had no major exacerbations.  However,  she reports a few exacerbations with leg and hand tingling or more pain in the legs.    She had her most recent 'exacerbation'  08/31/2021 and went to the ED. at the time, she was reporting itching and tingling throughout her body that started on 08/30/2021.  In the emergency room, she was given prednisone and oxycodone and she felt better a few hours later with resolution of symptoms back to her baseline.  She had no focal deficits in the emergency room.  Liver function test showed mild elevated transaminases.  CBC and UA were fine.  Of note, a review of imaging reports performed at [facility] between 2016 and 2019 showed nonspecific changes in the brain  described as being more consistent with chronic microvascular ischemic change.  Although the MRI of the cervical spine from 2022 reportedly found patchy areas of abnormal signal within the cord, more conspicuous than prior examinations, MRI from 2019 and 2017 showed a normal spinal cord.  Currently, she feels her gait is mildly reduced, to mostly the pain.   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   Celebrex had not helped her gait but a steroid hip injection helped.     Pain increases with sitting.   She also has LBP and neck pain.   She has muscle spasms and takes baclofen.   She denies arm weakness but legs sometimes feel weak.  She also notes knee and hip pain.   She denies numbness in general but gets occasional flare-ups of numbness.  She takes pregabalin 100 mg po tid.   She denies much clumsiness.     She has urinary frequency and is on 15 mg oxybutynin.  Anther medication in the past had not helped (name unknown).  She has nocturia x 3.     She denies diplopia or MS-related visual changes.  She has had cataract surgery and had a torn retina requiring surgery.     She has fatigue.   She has OSA and was on CPAP but no longer has it.  She prefers not to use CPAP.  She did not feel any better with CPAP.  She wakes up frequently to use the bathroom.    She notes some short term memory issues.    She denies depression now but had some in 2020 with Covid.     She has essential hypertension and type 2 NIDDM.   She also has known knee and hip degenerative changes.  She was hospitalized with Covid-19 in July 2020 for 1 week.  She needed O2 but was not intubated.   She has a niece with MS.  Imaging report reviewed: MRI cervical spine 04/15/2021 reportedly showed "Patchy areas of abnormal signal within the cord, more conspicuous as compared with prior examinations, difficult to compare, most consistent with history of demyelination. No abnormal enhancement noted."  MRI of the head 04/13/2021  showed "Moderate white matter disease, grossly stable as compared with most recent examination and slowly progressed as compared with multiple prior examinations. The distribution is overall not specific but the involvement is felt to be most likely related to progressive chronic small vessel ischemia. Areas of superimposed chronic demyelination can be considered in the differential; however, clinical correlation is recommended. "  MRI of the lumbar spine 09/03/2018 showed "Multilevel degenerative disc disease and facet arthropathy. Grade 1 lumbar canal stenosis at L3-4   Bilateral grade 1 foraminal stenosis at L4-5"  MRI of the brain 09/02/2018 showed "Mild to moderate periventricular and subcortical white matter T2/FLAIR hyperintensities are likely related to chronic small vessel ischemic disease.  There is no restricted diffusion to suggest acute infarct.  No hemorrhagic products "  MRI of the cervical spine 05/01/2018 showed "No focal abnormal cord signal or enhancement.  Mild degenerative changes, most pronounced at C4-C5 with facet arthropathy resulting in suspected bilateral neural foraminal narrowing. "  MRI of the thoracic spine 05/01/2018 showed "No definite abnormal spinal cord signal or enhancement.  Mild epidural lipomatosis, unchanged.  "  MRI of the brain 05/01/2018 showed "Unchanged moderate MS plaque burden when compared to 09/04/2015. No evidence for active demyelination.  Suspect superimposed chronic small vessel ischemic changes. "  MRI of the cervical spine 09/02/2016 showed "In the pons there is a fairly extensive signal abnormality redemonstrated and unchanged from the prior MRI C-spine on 04/08/2014.   The cervical spinal cord demonstrates normal signal morphology without any abnormal areas of enhancement. "  REVIEW OF SYSTEMS: Constitutional: No fevers, chills, sweats, or change in appetite.  She has fatigue. Eyes: No visual changes, double vision, eye pain Ear, nose and throat: No  hearing loss, ear pain, nasal congestion, sore throat Cardiovascular: No chest pain, palpitations Respiratory:  No shortness of breath at rest or with exertion.   No wheezes.  She has obstructive sleep apnea and used to use CPAP but no longer does so. GastrointestinaI: No nausea, vomiting, diarrhea, abdominal pain, fecal incontinence Genitourinary:  No dysuria, urinary retention or frequency.  No nocturia. Musculoskeletal:  No neck pain, back pain Integumentary: No rash, pruritus, skin lesions Neurological: as above Psychiatric: No depression at this time.  No anxiety Endocrine: No palpitations, diaphoresis, change in appetite, change in weigh or increased thirst Hematologic/Lymphatic:  No anemia, purpura, petechiae. Allergic/Immunologic: No itchy/runny eyes, nasal congestion, recent allergic reactions, rashes  ALLERGIES: Allergies  Allergen Reactions   Compazine [Prochlorperazine]    Lisinopril     HOME MEDICATIONS:  Current Outpatient Medications:    aspirin 81 MG chewable tablet, Chew 81 mg by mouth daily., Disp: , Rfl:    atorvastatin (LIPITOR) 80 MG tablet, Take 80 mg by mouth daily., Disp: , Rfl:  baclofen (LIORESAL) 10 MG tablet, Take 10 mg by mouth 3 (three) times daily., Disp: , Rfl:    Celecoxib (CELEBREX PO), Take by mouth., Disp: , Rfl:    chlorthalidone (HYGROTON) 25 MG tablet, Take 25 mg by mouth daily., Disp: , Rfl:    empagliflozin (JARDIANCE) 10 MG TABS tablet, Take 25 mg by mouth daily., Disp: , Rfl:    ergocalciferol (VITAMIN D2) 1.25 MG (50000 UT) capsule, Take 50,000 Units by mouth every Friday., Disp: , Rfl:    interferon beta-1a (REBIF) 44 MCG/0.5ML SOSY injection, Inject 44 mcg into the skin every Monday, Wednesday, and Friday., Disp: , Rfl:    metFORMIN (GLUCOPHAGE) 1000 MG tablet, Take 1,000 mg by mouth 2 (two) times daily with a meal., Disp: , Rfl:    mirabegron ER (MYRBETRIQ) 50 MG TB24 tablet, Take 1 tablet (50 mg total) by mouth daily., Disp: 30 tablet,  Rfl: 5   oxybutynin (DITROPAN XL) 15 MG 24 hr tablet, Take 10 mg by mouth at bedtime., Disp: , Rfl:    oxyCODONE-acetaminophen (PERCOCET/ROXICET) 5-325 MG tablet, Take 1 tablet by mouth every 6 (six) hours as needed for severe pain., Disp: 10 tablet, Rfl: 0   pantoprazole (PROTONIX) 40 MG tablet, Take 40 mg by mouth daily., Disp: , Rfl:    potassium chloride SA (KLOR-CON) 20 MEQ tablet, Take 10 mEq by mouth daily., Disp: , Rfl:    pregabalin (LYRICA) 100 MG capsule, Take 100 mg by mouth 3 (three) times daily. Brand name only, Disp: , Rfl:   PAST MEDICAL HISTORY: Past Medical History:  Diagnosis Date   Asthma    Cancer ([place])    Diabetes mellitus without complication ([place])    Fibromyalgia    MS (multiple sclerosis) ([place])     PAST SURGICAL HISTORY: Past Surgical History:  Procedure Laterality Date   APPENDECTOMY     KNEE SURGERY      FAMILY HISTORY: Family History  Problem Relation Age of Onset   Alopecia Mother    Cancer Mother    Cancer Father    Diabetes Father    Cancer Sister     SOCIAL HISTORY:  Social History   Socioeconomic History   Marital status: Divorced    Spouse name: Not on file   Number of children: 2   Years of education: Not on file   Highest education level: High school graduate  Occupational History   Not on file  Tobacco Use   Smoking status: Never   Smokeless tobacco: Never  Substance and Sexual Activity   Alcohol use: Yes   Drug use: Never   Sexual activity: Not on file  Other Topics Concern   Not on file  Social History Narrative   Lives w daughter   Right handed   Caffeine: occa drinks tea   Social Determinants of Health   Financial Resource Strain: Not on file  Food Insecurity: Not on file  Transportation Needs: Not on file  Physical Activity: Not on file  Stress: Not on file  Social Connections: Not on file  Intimate Partner Violence: Not on file     PHYSICAL EXAM  Vitals:   10/26/21 0936  BP: 123/79  Pulse: 78   Weight: 216 lb (98 kg)  Height: 5\' 4"  (1.626 m)    Body mass index is 37.08 kg/m.   General: The patient is well-developed and well-nourished and in no acute distress  HEENT:  Head is [place]/AT.  Sclera are anicteric.  Funduscopic exam shows normal  optic discs and retinal vessels.  Neck: No carotid bruits are noted.  The neck is mildly tender with mild reduced ROM  Cardiovascular: The heart has a regular rate and rhythm with a normal S1 and S2. There were no murmurs, gallops or rubs.    Skin: Extremities are without rash or  edema.  Musculoskeletal:  Back is nontender  Neurologic Exam  Mental status: The patient is alert and oriented x 3 at the time of the examination. The patient has apparent normal recent and remote memory, with an apparently normal attention span and concentration ability.   Speech is normal.  Cranial nerves: Extraocular movements are full. Pupils are equal, round, and reactive to light and accomodation.  Visual fields are full.  Facial symmetry is present. There is good facial sensation to soft touch bilaterally.Facial strength is normal.  Trapezius and sternocleidomastoid strength is normal. No dysarthria is noted.  The tongue is midline, and the patient has symmetric elevation of the soft palate. No obvious hearing deficits are noted.  Motor:  Muscle bulk is normal.   Tone is normal. Strength is  5 / 5 in all 4 extremities.   Sensory: Sensory testing is intact to pinprick, soft touch and vibration sensation in all 4 extremities.  Coordination: Cerebellar testing reveals good finger-nose-finger and heel-to-shin bilaterally.  Gait and station: Station is normal.   Gait is arthritic and mildly wide.   Tandem is wide.   Romberg is negative.   Reflexes: Deep tendon reflexes are symmetric and normal in arms, trace at knees and absent in ankles.  Plantar responses are flexor.    DIAGNOSTIC DATA (LABS, IMAGING, TESTING) - I reviewed patient records, labs, notes,  testing and imaging myself where available.  Lab Results  Component Value Date   WBC 9.0 08/31/2021   HGB 14.8 08/31/2021   HCT 45.3 08/31/2021   MCV 90.6 08/31/2021   PLT 248 08/31/2021      Component Value Date/Time   NA 138 08/31/2021 0326   K 3.4 (L) 08/31/2021 0326   CL 100 08/31/2021 0326   CO2 27 08/31/2021 0326   GLUCOSE 132 (H) 08/31/2021 0326   BUN 19 08/31/2021 0326   CREATININE 0.95 08/31/2021 0326   CALCIUM 9.3 08/31/2021 0326   PROT 7.6 08/31/2021 0326   ALBUMIN 3.4 (L) 08/31/2021 0326   AST 61 (H) 08/31/2021 0326   ALT 65 (H) 08/31/2021 0326   ALKPHOS 62 08/31/2021 0326   BILITOT 0.6 08/31/2021 0326   GFRNONAA >60 08/31/2021 0326       ASSESSMENT AND PLAN  Multiple sclerosis (HCC)  Pain in both lower extremities  Urinary dysfunction  Numbness   In summary, Ms. Vanbuskirk is a 65 year old woman who was diagnosed with MS in 2004 and has been on Rebif since that time.  Although she reports exacerbations about once a year, they tend to be mild sensory events that do not always last more than 24 hours.  Gait is mildly reduced, likely because of her arthritic changes.    MRI reports are difficult to interpret as they do not clearly show demyelination.  Specifically, 2 of the 3 cervical spine report I reviewed stated that the spinal cord was normal while the therapist stated that there were patchy changes that progressed compared to the previous MRI.  Without seeing the actual images, I do not know if this is artifact or real.  Unfortunately, she did not have the images with her.  Additionally, the MRIs of the brain  seem to report changes more consistent with chronic microvascular ischemic change than demyelination.  I will try to get her most recent MRIs for personal review of the images  For now, I will continue the Rebif.  However, if the MRI images are more consistent with chronic microvascular ischemic change rather than MS, we may wish to discontinue this.   Regardless, since she apparently has stability discontinuation of a disease modifying therapy could be considered at this age.  I do not think her most recent episode was an exacerbation.  She does report significant urinary urgency.  I will add Myrbetriq to the oxybutynin that she is already on.  She will return to see [place] in 6 months or sooner if there are new or worsening neurologic symptoms.  She will return to see [place] in 4 to 6 months or sooner if there are new or worsening neurologic symptoms.  Hopefully I will get her old films to review shortly.  Thank you for asking to see Ms. Despain.  Please let me know if I can be of further assistance with her in the future.    Markas Aldredge A. Felecia Shelling, MD, [facility] 46/43/1427, 6:70 PM Certified in Neurology, Clinical Neurophysiology, Sleep Medicine and Neuroimaging  [facility] [address] [place], [place] [address] [phone]

## 2021-10-26 NOTE — Telephone Encounter (Addendum)
Request faxed to [facility] request mri brain and spine on cd  r/c  cd on 11/10/21

## 2021-10-26 NOTE — Unmapped (Signed)
Page Spiro requesting her MRI results be faxed to her new neurologist in [place]     Patient has appointment today at 9:15    Patient will cal back with fax #

## 2021-10-26 NOTE — Unmapped (Signed)
MRI results sent to [facility] neuro.FYI - no further action needed

## 2021-10-31 ENCOUNTER — Encounter: Payer: Self-pay | Admitting: Emergency Medicine

## 2021-10-31 ENCOUNTER — Ambulatory Visit
Admission: EM | Admit: 2021-10-31 | Discharge: 2021-10-31 | Disposition: A | Discharge status: Home / Self Care | Payer: Medicare HMO | Attending: Internal Medicine | Admitting: Internal Medicine

## 2021-10-31 ENCOUNTER — Other Ambulatory Visit: Payer: Self-pay

## 2021-10-31 DIAGNOSIS — J069 Acute upper respiratory infection, unspecified: Secondary | ICD-10-CM

## 2021-10-31 MED ORDER — PREDNISONE 10 MG PO TABS: 20.0000 mg | ORAL_TABLET | Freq: Every day | ORAL | 0 refills | Status: AC

## 2021-10-31 NOTE — ED Triage Notes (Signed)
Patient c/o productive cough, runny nose, afebrile, requesting to be tested for COVID.  Patient is vaccinated for COVID.

## 2021-10-31 NOTE — Discharge Instructions (Signed)
It appears that you have a viral upper respiratory infection.  COVID-19 test is pending.  You have been prescribed prednisone steroid that should help alleviate your symptoms.  Please monitor blood sugars and oxygen daily. Please go to go to the hospital if symptoms worsen.

## 2021-10-31 NOTE — ED Provider Notes (Signed)
[facility]    CSN: [id number] Arrival date & time: 10/31/21  0946      History   Chief Complaint Chief Complaint  Patient presents with   Cough    HPI Alisha Martinez is a 65 y.o. female.   Patient presents with dry cough and runny nose that started approximately 2 to 3 days ago.  Patient denies any known fevers or sick contacts.  Denies nausea, vomiting, diarrhea, abdominal pain.  Patient has taken over-the-counter cough and cold medications with improvement in symptoms.  Patient requesting COVID-19 testing.  Denies chest pain or shortness of breath.  Patient does have history of asthma.   Cough  Past Medical History:  Diagnosis Date   Asthma    Cancer ([place])    Diabetes mellitus without complication ([place])    Fibromyalgia    MS (multiple sclerosis) ([place])     Patient Active Problem List   Diagnosis Date Noted   Multiple sclerosis ([place]) 10/26/2021   Pain in both lower extremities 10/26/2021   Urinary dysfunction 10/26/2021   Numbness 10/26/2021    Past Surgical History:  Procedure Laterality Date   APPENDECTOMY     KNEE SURGERY      OB History   No obstetric history on file.      Home Medications    Prior to Admission medications   Medication Sig Start Date End Date Taking? Authorizing Provider  aspirin 81 MG chewable tablet Chew 81 mg by mouth daily.   Yes [provider]  atorvastatin (LIPITOR) 80 MG tablet Take 80 mg by mouth daily.   Yes [provider]  baclofen (LIORESAL) 10 MG tablet Take 10 mg by mouth 3 (three) times daily.   Yes [provider]  Celecoxib (CELEBREX PO) Take by mouth.   Yes [provider]  chlorthalidone (HYGROTON) 25 MG tablet Take 25 mg by mouth daily.   Yes [provider]  empagliflozin (JARDIANCE) 10 MG TABS tablet Take 25 mg by mouth daily.   Yes [provider]  ergocalciferol (VITAMIN D2) 1.25 MG (50000 UT) capsule Take 50,000 Units by mouth every Friday.    Yes [provider]  interferon beta-1a (REBIF) 44 MCG/0.5ML SOSY injection Inject 44 mcg into the skin every Monday, Wednesday, and Friday.   Yes [provider]  metFORMIN (GLUCOPHAGE) 1000 MG tablet Take 1,000 mg by mouth 2 (two) times daily with a meal.   Yes [provider]  mirabegron ER (MYRBETRIQ) 50 MG TB24 tablet Take 1 tablet (50 mg total) by mouth daily. 10/26/21  Yes Sater, Nanine Means, MD  oxybutynin (DITROPAN XL) 15 MG 24 hr tablet Take 10 mg by mouth at bedtime.   Yes [provider]  oxyCODONE-acetaminophen (PERCOCET/ROXICET) 5-325 MG tablet Take 1 tablet by mouth every 6 (six) hours as needed for severe pain. 08/31/21  Yes Plunkett, Loree Fee, MD  pantoprazole (PROTONIX) 40 MG tablet Take 40 mg by mouth daily.   Yes [provider]  potassium chloride SA (KLOR-CON) 20 MEQ tablet Take 10 mEq by mouth daily.   Yes [provider]  predniSONE (DELTASONE) 10 MG tablet Take 2 tablets (20 mg total) by mouth daily for 5 days. 10/31/21 11/05/21 Yes Dalary Hollar, Michele Rockers, FNP  pregabalin (LYRICA) 100 MG capsule Take 100 mg by mouth 3 (three) times daily. Brand name only   Yes [provider]    Family History Family History  Problem Relation Age of Onset   Alopecia Mother  Cancer Mother    Cancer Father    Diabetes Father    Cancer Sister     Social History Social History   Tobacco Use   Smoking status: Never   Smokeless tobacco: Never  Substance Use Topics   Alcohol use: Yes   Drug use: Never     Allergies   Compazine [prochlorperazine] and Lisinopril   Review of Systems Review of Systems Per HPI  Physical Exam Triage Vital Signs ED Triage Vitals  Enc Vitals Group     BP 10/31/21 1322 121/74     Pulse Rate 10/31/21 1322 (!) 108     Resp 10/31/21 1322 20     Temp 10/31/21 1322 97.7 F (36.5 C)     Temp Source 10/31/21 1322 Oral     SpO2 10/31/21 1322 94 %     Weight 10/31/21 1325 214 lb (97.1 kg)      Height 10/31/21 1325 5\' 4"  (1.626 m)     Head Circumference --      Peak Flow --      Pain Score 10/31/21 1324 0     Pain Loc --      Pain Edu? --      Excl. in [place]? --    No data found.  Updated Vital Signs BP 121/74 (BP Location: Right Arm)   Pulse (!) 108   Temp 97.7 F (36.5 C) (Oral)   Resp 20   Ht 5\' 4"  (1.626 m)   Wt 214 lb (97.1 kg)   SpO2 94%   BMI 36.73 kg/m   Visual Acuity Right Eye Distance:   Left Eye Distance:   Bilateral Distance:    Right Eye Near:   Left Eye Near:    Bilateral Near:     Physical Exam Constitutional:      Appearance: Normal appearance. She is not toxic-appearing or diaphoretic.  HENT:     Head: Normocephalic and atraumatic.     Right Ear: Tympanic membrane and ear canal normal.     Left Ear: Tympanic membrane and ear canal normal.     Nose: Congestion present.     Mouth/Throat:     Mouth: Mucous membranes are moist.     Pharynx: No posterior oropharyngeal erythema.  Eyes:     Extraocular Movements: Extraocular movements intact.     Conjunctiva/sclera: Conjunctivae normal.     Pupils: Pupils are equal, round, and reactive to light.  Cardiovascular:     Rate and Rhythm: Normal rate and regular rhythm.     Pulses: Normal pulses.     Heart sounds: Normal heart sounds.  Pulmonary:     Effort: Pulmonary effort is normal. No respiratory distress.     Breath sounds: Normal breath sounds. No stridor. No wheezing, rhonchi or rales.  Chest:     Chest wall: No tenderness.  Abdominal:     General: Abdomen is flat. Bowel sounds are normal.     Palpations: Abdomen is soft.  Musculoskeletal:        General: Normal range of motion.     Cervical back: Normal range of motion.  Skin:    General: Skin is warm and dry.  Neurological:     General: No focal deficit present.     Mental Status: She is alert and oriented to person, place, and time. Mental status is at baseline.  Psychiatric:        Mood and Affect: Mood normal.        Behavior:  Behavior normal.     UC Treatments / Results  Labs (all labs ordered are listed, but only abnormal results are displayed) Labs Reviewed  NOVEL CORONAVIRUS, NAA    EKG   Radiology No results found.  Procedures Procedures (including critical care time)  Medications Ordered in UC Medications - No data to display  Initial Impression / Assessment and Plan / UC Course  I have reviewed the triage vital signs and the nursing notes.  Pertinent labs & imaging results that were available during my care of the patient were reviewed by me and considered in my medical decision making (see chart for details).     Patient presents with symptoms likely from a viral upper respiratory infection. Differential includes bacterial pneumonia, sinusitis, allergic rhinitis, Covid 19. Do not suspect underlying cardiopulmonary process. Symptoms seem unlikely related to ACS, CHF or COPD exacerbations, pneumonia, pneumothorax. Patient is nontoxic appearing and not in need of emergent medical intervention.  COVID-19 PCR pending.  Recommended symptom control with over the counter medications: Daily oral anti-histamine, Oral decongestant or IN corticosteroid, saline irrigations, cepacol lozenges, Robitussin, Delsym, honey tea.  Patient's oxygen saturation ranging from 93 to 94% during physical exam in triage.  No signs of respiratory distress, adventitious lung sounds, and no complaints of shortness of breath.  Patient was offered chest x-ray but declined.  Will prescribe prednisone steroid at low-dose and short course to help alleviate inflammation in chest and symptoms.  Advised patient to monitor blood glucose and oxygen saturation at home very closely.  Patient was advised to go to the hospital if symptoms worsen in any way.  Patient is nontoxic-appearing, not in respiratory distress, and no shortness of breath so do not think patient is in need of immediate medical attention in the hospital at this  time.  Return if symptoms fail to improve in 1-2 weeks or you develop shortness of breath, chest pain, severe headache. Patient states understanding and is agreeable.  Discharged with PCP followup.  Final Clinical Impressions(s) / UC Diagnoses   Final diagnoses:  Viral upper respiratory tract infection with cough     Discharge Instructions      It appears that you have a viral upper respiratory infection.  COVID-19 test is pending.  You have been prescribed prednisone steroid that should help alleviate your symptoms.  Please monitor blood sugars and oxygen daily. Please go to go to the hospital if symptoms worsen.     ED Prescriptions     Medication Sig Dispense Auth. Provider   predniSONE (DELTASONE) 10 MG tablet Take 2 tablets (20 mg total) by mouth daily for 5 days. 10 tablet Teodora Medici, [place]      PDMP not reviewed this encounter.   Teodora Medici, [place] 10/31/21 1431

## 2021-11-01 LAB — NOVEL CORONAVIRUS, NAA: SARS-CoV-2, NAA: DETECTED — AB

## 2021-11-01 LAB — SARS-COV-2, NAA 2 DAY TAT

## 2021-11-02 ENCOUNTER — Telehealth (HOSPITAL_COMMUNITY): Payer: Self-pay | Admitting: Emergency Medicine

## 2021-11-02 MED ORDER — MOLNUPIRAVIR EUA 200MG CAPSULE: 4.0000 | ORAL_CAPSULE | Freq: Two times a day (BID) | ORAL | 0 refills | Status: AC

## 2021-11-04 NOTE — Progress Notes (Signed)
Patient is outside of the window for antivirals. Continue current treatment plan and follow up as needed.

## 2021-11-15 ENCOUNTER — Telehealth: Payer: Self-pay | Admitting: Neurology

## 2021-11-15 NOTE — Telephone Encounter (Signed)
I personally reviewed the MRIs of the brain and cervical spine from 04/13/2021 and 04/15/2021.  The MRI of the head shows multiple T2/FLAIR hyperintense foci predominantly in the deep white matter but also in the periventricular and subcortical white matter.  Confluent foci are noted in the periatrial white matter as well as in the pons.  Some of the foci could be consistent with demyelination though the vast majority are more consistent with chronic microvascular ischemic change  The MRI of the cervical spine showed 1 T2 hyperintense focus centrally towards the left adjacent to C3.  Some degenerative changes are noted but no significant spinal stenosis or nerve root compression.

## 2021-12-04 NOTE — Telephone Encounter (Signed)
I discussed the findings with Alisha Martinez.  The MRI is most consistent with a combination of mild demyelinating changes and chronic microvascular ischemic change (she has hypertension diabetes).  The one focus in the spinal cord is most consistent with demyelination.  She appears to have been clinically stable on Rebif and I recommend that she continue that.  At some point, she might be able to stop the disease modifying therapies as MS tends to be much less aggressive with age.

## 2021-12-23 ENCOUNTER — Telehealth: Payer: Self-pay | Admitting: Neurology

## 2021-12-23 DIAGNOSIS — G35 Multiple sclerosis: Secondary | ICD-10-CM

## 2021-12-23 MED ORDER — REBIF 44 MCG/0.5ML ~~LOC~~ SOSY: 44.0000 ug | PREFILLED_SYRINGE | SUBCUTANEOUS | 11 refills | Status: DC

## 2021-12-23 NOTE — Telephone Encounter (Signed)
Called pt back. Rebif is a specialty drug. Cannot go to local pharmacy. She then states she uses [facility]/does not have any further info. Phone:[phone]. Confirmed this is mail order/specialty. She will call them to find out address/phone/fax. She will call back to tell us which one to e-scribe rx to.   I ended up calling pharmacy. This is location we should e-scribe rx to: [place], [place] Phone:  [phone]  Fax:  [phone]    I e-scribed rx. I called pt back to let her know update. She verbalized understanding.

## 2021-12-23 NOTE — Telephone Encounter (Signed)
Pt called asking if we can refill her interferon beta-1a (REBIF) 44 MCG/0.5ML SOSY injection. Pharmacy [facility], [place] AT [address]

## 2021-12-27 HISTORY — PX: EYE SURGERY: SHX253

## 2022-04-29 ENCOUNTER — Ambulatory Visit: Payer: Medicare HMO | Admitting: Neurology

## 2022-04-29 ENCOUNTER — Encounter: Payer: Self-pay | Admitting: Neurology

## 2022-04-29 VITALS — BP 124/84 | HR 93 | Ht 64.0 in | Wt 220.0 lb

## 2022-04-29 DIAGNOSIS — R39198 Other difficulties with micturition: Secondary | ICD-10-CM | POA: Diagnosis not present

## 2022-04-29 DIAGNOSIS — R200 Anesthesia of skin: Secondary | ICD-10-CM | POA: Diagnosis not present

## 2022-04-29 DIAGNOSIS — H539 Unspecified visual disturbance: Secondary | ICD-10-CM

## 2022-04-29 DIAGNOSIS — M79604 Pain in right leg: Secondary | ICD-10-CM

## 2022-04-29 DIAGNOSIS — G35 Multiple sclerosis: Secondary | ICD-10-CM

## 2022-04-29 DIAGNOSIS — M79605 Pain in left leg: Secondary | ICD-10-CM

## 2022-04-29 NOTE — Progress Notes (Signed)
? ?[facility] ? ?PATIENT: Alisha Martinez ?DOB: 11-26-1956 ? ?REFERRING DOCTOR OR PCP: Kristie Cowman, MD ?SOURCE: Patient, notes from [facility] neurology, imaging and lab reports ? ?_________________________________ ? ? ?HISTORICAL ? ?CHIEF COMPLAINT:  ?Chief Complaint  ?Patient presents with  ? Follow-up  ?  Rm 1, alone. Here for 6 month f/u for MS, on Rebif and tolerating well. Pt had eye surgery in Feb and is unable to see clearly.   ? ? ?HISTORY OF PRESENT ILLNESS:  ?Alisha Martinez is a 66 y.o. woman with MS. ? ?UPDATE 04/29/2022 ?She is on Rebif and tolerates it well..   While in [place], she had an episode of slurred speech and weakness and presented to the emergency room.  She had MRI of the cervical spine and brain.  Cervical spine was read as being normal and there is no spinal stenosis.  The brain MRI showed no acute findings and stable T2 hyperintense foci consistent with MS  ? ?She has notice that vision is worsening.   She had surgery 01/2021 (retinal tear) and noticed improvement at first but then gradual worsening.  She also has noted some issues in the other eyes.    ? ?Currently, she feels her gait is mildly reduced and she uses a cane . She also has hip arthritis that she feels affects her gait more than the MS.   An injection helped sort term only.She is on Voltaren 50 mg.   Pain increases with sitting.   She also has LBP and neck pain.   She has muscle spasms and takes baclofen.   She denies arm weakness but legs sometimes feel weak.  She also notes knee and hip pain.   Some dysesthesias.   She takes pregabalin 100 mg po tid.   She denies much clumsiness.     She has urinary frequency and is on 15 mg oxybutynin and Myrbetriq.  We added tha last visit and it has helped a lot.  .  ? ?She has fatigue.   She has OSA and was on CPAP but no longer has it.  She prefers not to use CPAP.  She did not feel any better with CPAP.  She wakes up frequently to use the bathroom.     She notes some short term memory issues.    She denies depression now but had some in 2020 with Covid.    ? ?She has essential hypertension and type 2 NIDDM.   She also has known knee and hip degenerative changes. ? ?MRI cervical spine 03/27/2022 showed a normal spinal cord and mild DJD    ? ?MRI brain 03/27/2022 showed no acute findings.   She had stable T2 hyperintense foci.   ? ? 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   She needed to rest much more between tasks.   Over the previous 2 years, she also had issues with short term memory and tingling in her limbs.   She had MRI's in 2004 that were concerning for MS.  She was diagnosed in [place] at the [facility].    She never had an LP.   She was started on Rebif and has tolerated it well.  She has had no major exacerbations.  However,  she reports a few exacerbations with leg and hand tingling or more pain in the legs.    She had  her most recent 'exacerbation'  08/31/2021 and went to the ED. at the time, she was reporting itching and tingling throughout her body that started on 08/30/2021.  In the emergency room, she was given prednisone and oxycodone and she felt better a few hours later with resolution of symptoms back to her baseline.  She had no focal deficits in the emergency room.  Liver function test showed mild elevated transaminases.  CBC and UA were fine. ? ?Of note, a review of imaging reports performed at [facility] between 2016 and 2019 showed nonspecific changes in the brain described as being more consistent with chronic microvascular ischemic change.  Although the MRI of the cervical spine from 2022 reportedly found patchy areas of abnormal signal within the cord, more conspicuous than prior examinations, MRI from 2019 and 2017 showed a normal spinal cord. ?She was hospitalized with Covid-19 in July 2020  for 1 week.  She needed O2 but was not intubated.  ? ?She has a niece with MS. ? ?Imaging (04/15/21 images personally reviewed) ?MRI cervical spine 03/27/2022 showed a normal spinal cord and mild DJD    ? ?MRI brain 03/27/2022 showed no acute findings.   She had stable T2 hyperintense foci.   ? ?MRI cervical spine 04/15/2021 showed 1 T2 hyperintense focus centrally towards the left adjacent to C3.  Some degenerative changes are noted but no significant spinal stenosis or nerve root compression. ? ?MRI of the head 04/13/2021 showed  multiple T2/FLAIR hyperintense foci predominantly in the deep white matter but also in the periventricular and subcortical white matter.  Confluent foci are noted in the periatrial white matter as well as in the pons.  Some of the foci could be consistent with demyelination though the vast majority are more consistent with chronic microvascular ischemic change ? ?MRI of the lumbar spine 09/03/2018 showed "Multilevel degenerative disc disease and facet arthropathy. Grade 1 lumbar canal stenosis at L3-4   Bilateral grade 1 foraminal stenosis at L4-5" ? ?MRI of the brain 09/02/2018 showed "Mild to moderate periventricular and subcortical white matter T2/FLAIR hyperintensities are likely related to chronic small vessel ischemic disease.  There is no restricted diffusion to suggest acute infarct.  No hemorrhagic products " ? ?MRI of the cervical spine 05/01/2018 showed "No focal abnormal cord signal or enhancement.  ?Mild degenerative changes, most pronounced at C4-C5 with facet arthropathy resulting in suspected bilateral neural foraminal narrowing. " ? ?MRI of the thoracic spine 05/01/2018 showed "No definite abnormal spinal cord signal or enhancement.  Mild epidural lipomatosis, unchanged.  " ? ?MRI of the brain 05/01/2018 showed "Unchanged moderate MS plaque burden when compared to 09/04/2015. No evidence for active demyelination.  Suspect superimposed chronic small vessel ischemic changes. " ? ?MRI of the  cervical spine 09/02/2016 showed "In the pons there is a fairly extensive signal abnormality redemonstrated and unchanged from the prior MRI C-spine on 04/08/2014.   The cervical spinal cord demonstrates normal signal morphology without any abnormal areas of enhancement. " ? ?REVIEW OF SYSTEMS: ?Constitutional: No fevers, chills, sweats, or change in appetite.  She has fatigue. ?Eyes: No visual changes, double vision, eye pain ?Ear, nose and throat: No hearing loss, ear pain, nasal congestion, sore throat ?Cardiovascular: No chest pain, palpitations ?Respiratory:  No shortness of breath at rest or with exertion.   No wheezes.  She has obstructive sleep apnea and used to use CPAP but no longer does so. ?GastrointestinaI: No nausea, vomiting, diarrhea, abdominal pain, fecal incontinence ?Genitourinary:  No dysuria, urinary retention or frequency.  No nocturia. ?Musculoskeletal:  No neck pain, back pain ?Integumentary: No rash, pruritus, skin lesions ?Neurological: as above ?Psychiatric: No depression at this time.  No anxiety ?Endocrine: No palpitations, diaphoresis, change in appetite, change in weigh or increased thirst ?Hematologic/Lymphatic:  No anemia, purpura, petechiae. ?Allergic/Immunologic: No itchy/runny eyes, nasal congestion, recent allergic reactions, rashes ? ?ALLERGIES: ?Allergies  ?Allergen Reactions  ? Compazine [Prochlorperazine]   ? Lisinopril   ? ? ?HOME MEDICATIONS: ? ?Current Outpatient Medications:  ?  aspirin 81 MG chewable tablet, Chew 81 mg by mouth daily., Disp: , Rfl:  ?  atorvastatin (LIPITOR) 80 MG tablet, Take 80 mg by mouth daily., Disp: , Rfl:  ?  baclofen (LIORESAL) 10 MG tablet, Take 10 mg by mouth 3 (three) times daily., Disp: , Rfl:  ?  chlorthalidone (HYGROTON) 25 MG tablet, Take 25 mg by mouth daily., Disp: , Rfl:  ?  diclofenac (VOLTAREN) 75 MG EC tablet, Take 75 mg by mouth 2 (two) times daily., Disp: , Rfl:  ?  ergocalciferol (VITAMIN D2) 1.25 MG (50000 UT) capsule, Take 50,000  Units by mouth every Friday., Disp: , Rfl:  ?  interferon beta-1a (REBIF) 44 MCG/0.5ML SOSY injection, Inject 0.5 mLs (44 mcg total) into the skin every Monday, Wednesday, and Friday., Disp: 6 mL, Rfl: 11 ?

## 2022-05-03 ENCOUNTER — Telehealth: Payer: Self-pay | Admitting: Neurology

## 2022-05-03 NOTE — Telephone Encounter (Signed)
Referral for Ophthalmology sent to [facility] [phone]. 

## 2022-05-14 ENCOUNTER — Other Ambulatory Visit: Payer: Self-pay | Admitting: Neurology

## 2022-08-25 ENCOUNTER — Telehealth: Payer: Self-pay | Admitting: *Deleted

## 2022-08-25 NOTE — Telephone Encounter (Signed)
Faxed completed/signed form below with last office notes to Raliegh Ip at [phone]. Received fax confirmation.

## 2022-08-29 DIAGNOSIS — N390 Urinary tract infection, site not specified: Secondary | ICD-10-CM

## 2022-08-29 NOTE — ED Provider Notes (Signed)
EMERGENCY MEDICINE ATTENDING NOTE  Jewel Baize. Genice Rouge., DO, FACEP, FAAEM        I have independently seen/performed a substantive portion of the visit (history, physical, and MDM) on [facility].  All diagnostic, treatment, and disposition decisions were made by myself in conjunction with the advanced practice provider.  I have participated in the medical decision making and directed the treatment plan and disposition of the patient.    For further details of [facility] emergency department encounter, please see the advanced practice provider's documentation.    Adrian Saran. Genice Rouge., DO, Lacie Scotts, am the primary physician provider of record.      CHIEF COMPLAINT  Chief Complaint   Patient presents with    Urinary Frequency     From home. PT states she was diagnosed at PCP office about a week ago, given an unknown shot. Lower abdominal pain and urinary frequency have increased since then. Hx T2DM       BRIEF HISTORY  Danielle Miles is a 66 y.o. female  who presents to the ED for evaluation of UTI.  Patient was recently diagnosed with a UTI and her primary doctor in [place] last week.  States she was given shot no prescription.  States she still having some symptoms.  Certainly feel little weak as well.    BRIEF/FOCUSED PHYSICAL EXAMINATION  VITAL SIGNS:    ED Triage Vitals [08/29/22 2046]   Enc Vitals Group      BP (!) 144/90      Pulse (!) 108      Respirations 19      Temp 98.4 F (36.9 C)      Temp Source Oral      SpO2 96 %      Weight - Scale 211 lb 1.6 oz (95.8 kg)      Height 5\' 4"  (1.626 m)      Head Circumference       Peak Flow       Pain Score       Pain Loc       Pain Edu?       Excl. in GC?      Physical Exam  Vitals and nursing note reviewed.   Constitutional:       General: She is not in acute distress.     Appearance: She is not ill-appearing or toxic-appearing.   HENT:      Mouth/Throat:      Mouth: Mucous membranes are moist.   Cardiovascular:      Rate and Rhythm:  Regular rhythm. Tachycardia present.      Pulses: Normal pulses.      Heart sounds: No murmur heard.  Neurological:      Mental Status: She is alert and oriented to person, place, and time.        EKG performed in ED:  None      RADIOLOGY  Any applicable radiology studies including x-ray, CT, MRI, and/or ultrasound, were reviewed independently by me in addition to the radiologist.  I reviewed all radiology images and reports as well from this evaluation.  No orders to display       LABS  I have personally reviewed all labs for this visit.   Results for orders placed or performed during the hospital encounter of 08/29/22   Urinalysis with Reflex to Culture    Specimen: Urine   Result Value Ref Range    Color, UA Yellow  Straw/Yellow    Clarity, UA Clear Clear    Glucose, Ur >=1000 (A) Negative mg/dL    Bilirubin Urine Negative Negative    Ketones, Urine TRACE (A) Negative mg/dL    Specific Gravity, UA >=1.030 1.005 - 1.030    Blood, Urine Negative Negative    pH, UA 6.0 5.0 - 8.0    Protein, UA 30 (A) Negative mg/dL    Urobilinogen, Urine 1.0 <2.0 E.U./dL    Nitrite, Urine Negative Negative    Leukocyte Esterase, Urine SMALL (A) Negative    Microscopic Examination YES     Urine Type NotGiven     Urine Reflex to Culture Yes    Lactate, Sepsis   Result Value Ref Range    Lactic Acid, Sepsis 3.3 (H) 0.4 - 1.9 mmol/L   Lactate, Sepsis   Result Value Ref Range    Lactic Acid, Sepsis 3.3 (H) 0.4 - 1.9 mmol/L   CBC with Auto Differential   Result Value Ref Range    WBC 9.0 4.0 - 11.0 K/uL    RBC 4.63 4.00 - 5.20 M/uL    Hemoglobin 13.4 12.0 - 16.0 g/dL    Hematocrit 48.1 85.6 - 48.0 %    MCV 88.7 80.0 - 100.0 fL    MCH 29.0 26.0 - 34.0 pg    MCHC 32.6 31.0 - 36.0 g/dL    RDW 31.4 97.0 - 26.3 %    Platelets 246 135 - 450 K/uL    MPV 9.1 5.0 - 10.5 fL    Neutrophils % 62.2 %    Lymphocytes % 25.3 %    Monocytes % 9.9 %    Eosinophils % 1.5 %    Basophils % 1.1 %    Neutrophils Absolute 5.6 1.7 - 7.7 K/uL    Lymphocytes  Absolute 2.3 1.0 - 5.1 K/uL    Monocytes Absolute 0.9 0.0 - 1.3 K/uL    Eosinophils Absolute 0.1 0.0 - 0.6 K/uL    Basophils Absolute 0.1 0.0 - 0.2 K/uL   Comprehensive Metabolic Panel   Result Value Ref Range    Sodium 143 136 - 145 mmol/L    Potassium 3.3 (L) 3.5 - 5.1 mmol/L    Chloride 99 99 - 110 mmol/L    CO2 30 21 - 32 mmol/L    Anion Gap 14 3 - 16    Glucose 129 (H) 70 - 99 mg/dL    BUN 25 (H) 7 - 20 mg/dL    Creatinine 1.1 0.6 - 1.2 mg/dL    Est, Glom Filt Rate 56 (A) >60    Calcium 10.0 8.3 - 10.6 mg/dL    Total Protein 8.1 6.4 - 8.2 g/dL    Albumin 4.0 3.4 - 5.0 g/dL    Albumin/Globulin Ratio 1.0 (L) 1.1 - 2.2    Total Bilirubin <0.2 0.0 - 1.0 mg/dL    Alkaline Phosphatase 96 40 - 129 U/L    ALT 61 (H) 10 - 40 U/L    AST 50 (H) 15 - 37 U/L   Microscopic Urinalysis   Result Value Ref Range    Bacteria, UA None Seen None Seen /HPF    Hyaline Casts, UA 0 0 - 8 /LPF    WBC, UA 74 (H) 0 - 5 /HPF    RBC, UA 0 0 - 4 /HPF    Epithelial Cells, UA 2 0 - 5 /HPF   Lactic Acid   Result Value Ref Range    Lactic Acid 1.5  0.4 - 2.0 mmol/L       BRIEF ED COURSE/MDM  Old records reviewed. Labs and imaging reviewed.  Patient seen and evaluated by myself along with the APP.  Patient does present with continued urinary symptoms after being treated last week for urinary infection.  Patient does have signs of urinary infection here and is treated for it.  Labs are fairly reassuring however does have elevated lactate.  She is feeling a little bit weak also.  She is given oral fluids and lactate is repeated and is unchanged.  At that time she was given IV fluids and her lactic acid is otherwise with normalized and she feels well.  She will be able to be discharged on antibiotics and is to follow-up with her primary doctor and return for any concern.    Medications given:  Medications   nitrofurantoin (macrocrystal-monohydrate) (MACROBID) capsule 100 mg (100 mg Oral Given 08/29/22 2240)   acetaminophen (TYLENOL) tablet 1,000 mg  (1,000 mg Oral Given 08/30/22 0028)   sodium chloride 0.9 % bolus 1,000 mL (0 mLs IntraVENous Stopped 08/30/22 0304)   0.9 % sodium chloride IV bolus 1,000 mL (0 mLs IntraVENous Stopped 08/30/22 0126)        Discharge Medications:  New Prescriptions    NITROFURANTOIN, MACROCRYSTAL-MONOHYDRATE, (MACROBID) 100 MG CAPSULE    Take 1 capsule by mouth 2 times daily for 10 days    PHENAZOPYRIDINE (PYRIDIUM) 200 MG TABLET    Take 1 tablet by mouth 3 times daily as needed for Pain (bladder spasm/pain)       PROCEDURES  None    CLINICAL IMPRESSION  1. Urinary tract infection in female        DISPOSITION  Danielle Miles was discharged in stable condition.    CRITICAL CARE TIME  None        Electronically signed by: Jewel Baize. Genice Rouge., DO, FACEP, Ivor Reining, 08/30/2022  3:17 AM       Gladstone Lighter, DO  08/30/22 [phone]

## 2022-08-29 NOTE — ED Provider Notes (Signed)
[place]        Pt Name: Danielle Miles  MRN: [id number]  [place] 1956-10-29  Date of evaluation: 08/29/2022  Provider: Rema Fendt, APRN - CNP  PCP: No primary care provider on file.  Note Started: 9:31 PM EDT 08/29/22      APP. I have evaluated this patient.        CHIEF COMPLAINT       Chief Complaint   Patient presents with    Urinary Frequency     From home. PT states she was diagnosed at PCP office about a week ago, given an unknown shot. Lower abdominal pain and urinary frequency have increased since then. Hx T2DM       HISTORY OF PRESENT ILLNESS: 1 or more Elements     History from : Patient    Limitations to history : None    Danielle Miles is a 66 y.o. female who presents to the emergency department with concern for urinary tract infection.  The patient does report urinary frequency with hesitancy and urgency as well as some low pelvic pain for about a week.  States that she is visiting from [place], was seen Saturday of last week and given an injection of an antibiotic but not prescribed p.o. antibiotic as well.  She states that her symptoms have not improved, actually worsened since time of onset a week ago.    She is a type II diabetic on Jardiance and metformin, does not check her blood sugar.    Denies any headache, fever, lightheadedness, dizziness, visual disturbances.  No chest pain or pressure.  No neck or back pain.  No shortness of breath, cough, or congestion.  No vomiting, diarrhea, constipation.  No rash.    Nursing Notes were all reviewed and agreed with or any disagreements were addressed in the HPI.    REVIEW OF SYSTEMS :      Review of Systems   Constitutional:  Negative for activity change, chills and fever.   Respiratory:  Negative for chest tightness and shortness of breath.    Cardiovascular:  Negative for chest pain.   Gastrointestinal:  Positive for abdominal pain. Negative for diarrhea, nausea and  vomiting.   Genitourinary:  Positive for dysuria, frequency and urgency.   All other systems reviewed and are negative.    Positives and Pertinent negatives as per HPI.     SURGICAL HISTORY     Past Surgical History:   Procedure Laterality Date    ANTERIOR CRUCIATE LIGAMENT REPAIR      APPENDECTOMY      COLONOSCOPY  06/16/2015    Colon Polyps     HYSTERECTOMY (CERVIX STATUS UNKNOWN)      due to prolapse       CURRENTMEDICATIONS       Previous Medications    ALBUTEROL SULFATE HFA (PROVENTIL HFA) 108 (90 BASE) MCG/ACT INHALER    INHALE TWO PUFFS BY MOUTH EVERY 6 HOURS AS NEEDED FOR WHEEZING OR FOR SHORTNESS OF BREATH    BACLOFEN (LIORESAL) 10 MG TABLET    TAKE ONE TABLET BY MOUTH THREE TIMES A DAY    BLOOD GLUCOSE MONITORING SUPPL (ACCU-CHEK NANO SMARTVIEW) W/DEVICE KIT    Use once daily.    BLOOD GLUCOSE TEST STRIPS (ACCU-CHEK AVIVA) STRIP    1 each by In Vitro route daily E11.9 Accucheck Nano    CHOLECALCIFEROL (VITAMIN D3) 50000 UNITS CAPS    Take by  mouth once a week    CLONIDINE HCL PO    Take by mouth    LOVASTATIN (MEVACOR) 40 MG TABLET    TAKE ONE TABLET BY MOUTH DAILY    METFORMIN (GLUCOPHAGE) 1000 MG TABLET    TAKE ONE TABLET BY MOUTH TWICE A DAY WITH MEALS    OXYBUTYNIN (DITROPAN-XL) 10 MG EXTENDED RELEASE TABLET    Take 1 tablet by mouth nightly    PIOGLITAZONE (ACTOS) 15 MG TABLET    TAKE ONE TABLET BY MOUTH DAILY    PREGABALIN (LYRICA) 75 MG CAPSULE    Take 75 mg by mouth    TRIAMTERENE-HYDROCHLOROTHIAZIDE (MAXZIDE-25) 37.5-25 MG PER TABLET    TAKE ONE TABLET BY MOUTH DAILY       ALLERGIES     Lisinopril and Compazine [prochlorperazine]    FAMILYHISTORY       Family History   Problem Relation Age of Onset    Cancer Mother 60        brain    Cancer Father 33        lung cancer        SOCIAL HISTORY       Social History     Tobacco Use    Smoking status: Never    Smokeless tobacco: Never   Substance Use Topics    Alcohol use: Yes     Comment: occ mixed drinks.    Drug use: No       SCREENINGS         Glasgow Coma Scale  Eye Opening: Spontaneous  Best Verbal Response: Oriented  Best Motor Response: Obeys commands  Glasgow Coma Scale Score: 15                CIWA Assessment  BP: (!) 141/78  Pulse: 76           PHYSICAL EXAM  1 or more Elements     ED Triage Vitals [08/29/22 2046]   BP Temp Temp Source Pulse Respirations SpO2 Height Weight - Scale   (!) 144/90 98.4 F (36.9 C) Oral (!) 108 19 96 % 5' 4"  (1.626 m) 211 lb 1.6 oz (95.8 kg)       Physical Exam  Vitals and nursing note reviewed.   Constitutional:       Appearance: She is well-developed. She is not diaphoretic.   HENT:      Head: Normocephalic and atraumatic.      Right Ear: External ear normal.      Left Ear: External ear normal.   Eyes:      General:         Right eye: No discharge.         Left eye: No discharge.   Neck:      Vascular: No JVD.   Cardiovascular:      Rate and Rhythm: Normal rate.      Pulses: Normal pulses.   Pulmonary:      Effort: Pulmonary effort is normal. No respiratory distress.      Breath sounds: Normal breath sounds.   Abdominal:      Palpations: Abdomen is soft.      Tenderness: There is no abdominal tenderness.   Musculoskeletal:         General: Normal range of motion.   Skin:     General: Skin is warm and dry.      Coloration: Skin is not pale.   Neurological:      Mental Status: She  is alert.   Psychiatric:         Behavior: Behavior normal.           DIAGNOSTIC RESULTS   LABS:    Labs Reviewed   URINALYSIS WITH REFLEX TO CULTURE - Abnormal; Notable for the following components:       Result Value    Glucose, Ur >=1000 (*)     Ketones, Urine TRACE (*)     Protein, UA 30 (*)     Leukocyte Esterase, Urine SMALL (*)     All other components within normal limits   LACTATE, SEPSIS - Abnormal; Notable for the following components:    Lactic Acid, Sepsis 3.3 (*)     All other components within normal limits   LACTATE, SEPSIS - Abnormal; Notable for the following components:    Lactic Acid, Sepsis 3.3 (*)     All other  components within normal limits   COMPREHENSIVE METABOLIC PANEL - Abnormal; Notable for the following components:    Potassium 3.3 (*)     Glucose 129 (*)     BUN 25 (*)     Est, Glom Filt Rate 56 (*)     Albumin/Globulin Ratio 1.0 (*)     ALT 61 (*)     AST 50 (*)     All other components within normal limits   MICROSCOPIC URINALYSIS - Abnormal; Notable for the following components:    WBC, UA 74 (*)     All other components within normal limits   CULTURE, URINE   CBC WITH AUTO DIFFERENTIAL   LACTIC ACID       When ordered only abnormal lab results are displayed. All other labs were within normal range or not returned as of this dictation.    EKG: When ordered, EKG's are interpreted by the Emergency Department Physician in the absence of a cardiologist.  Please see their note for interpretation of EKG.    RADIOLOGY:   Non-plain film images such as CT, Ultrasound and MRI are read by the radiologist. Plain radiographic images are visualized and preliminarily interpreted by the ED Provider with the below findings:        Interpretation per the Radiologist below, if available at the time of this note:    No orders to display     No results found.    No results found.    PROCEDURES   Unless otherwise noted below, none     Procedures    CRITICAL CARE TIME (.cctime)   none    PAST MEDICAL HISTORY      has a past medical history of Difficult intubation, HTN (hypertension) (07/15/2014), Multiple sclerosis ([place])- dx ~2000 (07/15/2014), and Type 2 diabetes mellitus ([place]) (dx 2013) (07/15/2014).     Chronic Conditions affecting Care: diabetes    EMERGENCY DEPARTMENT COURSE and DIFFERENTIAL DIAGNOSIS/MDM:   Vitals:    Vitals:    08/29/22 2046 08/30/22 0030 08/30/22 0040 08/30/22 0240   BP: (!) 144/90 127/88 119/82 (!) 141/78   Pulse: (!) 108  76    Resp: 19  18    Temp: 98.4 F (36.9 C)      TempSrc: Oral      SpO2: 96% 100% 95% 100%   Weight: 211 lb 1.6 oz (95.8 kg)      Height: 5' 4"  (1.626 m)          Patient was given the  following medications:  Medications   nitrofurantoin (macrocrystal-monohydrate) (MACROBID) capsule 100 mg (  100 mg Oral Given 08/29/22 2240)   acetaminophen (TYLENOL) tablet 1,000 mg (1,000 mg Oral Given 08/30/22 0028)   sodium chloride 0.9 % bolus 1,000 mL (1,000 mLs IntraVENous New Bag 08/30/22 0125)   0.9 % sodium chloride IV bolus 1,000 mL (0 mLs IntraVENous Stopped 08/30/22 0126)             Is this patient to be included in the SEP-1 Core Measure due to severe sepsis or septic shock?   No   Exclusion criteria - the patient is NOT to be included for SEP-1 Core Measure due to:  2+ SIRS criteria are not met    CONSULTS: (Who and What was discussed)  None  Discussion with Other Profesionals : None    Social Determinants : None    Records Reviewed : None    CC/HPI Summary, DDx, ED Course, and Reassessment:     Briefly, this is a very pleasant 66 year old female who presents to the emergency department with concern for urinary tract infection.  The patient does report urinary frequency with hesitancy and urgency as well as some low pelvic pain for about a week.  States that she is visiting from [place], was seen Saturday of last week and given an injection of an antibiotic but not prescribed p.o. antibiotic as well.  She states that her symptoms have not improved, actually worsened since time of onset a week ago.    She is a type II diabetic on Jardiance and metformin, does not check her blood sugar.    Lactic acid 3.3, after drinking a liter of water, lactic acid repeated 3.3.  She was then given a liter of IV fluids lactic acid at 1.5.  CBC is unremarkable.  CMP unremarkable.  Patient does have a urinary tract infection, she started on Macrobid in the emergency department will be prescribed 10-day course of Macrobid +3 days of as needed Pyridium.    Outpatient follow-up and strict return precautions discussed.    I see nothing that would suggest an acute abdomen at this time. Based on history, physical exam, risk  factors, and tests my suspicion for bowel obstruction, incarcerated hernia, acute pancreatitis, intra-abdominal abscess, perforated viscus, diverticulitis, cholecystitis, appendicitis, PID, ovarian torsion, ectopic pregnancy and tubo-ovarian abscess is very low. There is no evidence of peritonitis, sepsis or toxicity at this time. I feel the patient can be managed as an outpatient with follow-up with her family doctor in 24-48 hours. Instructions have been given for the patient to return to the ED for worsening of the pain, high fevers, intractable vomiting, or bleeding.       Disposition Considerations (include 1 Tests not done, Shared Decision Making, Pt Expectation of Test or Tx.): shared decision making used throughout    I did consider ct abdomen and pelvis, but patient is at time of recheck and labs are unremarkable.  Appropriate for outpatient management follow-up with primary care or return to this department as needed      I am the Primary Clinician of Record.    FINAL IMPRESSION      1. Urinary tract infection in female          DISPOSITION/PLAN     DISPOSITION Decision To Discharge 08/30/2022 03:01:02 AM      PATIENT REFERRED TO:  your doctor            DISCHARGE MEDICATIONS:  New Prescriptions    NITROFURANTOIN, MACROCRYSTAL-MONOHYDRATE, (MACROBID) 100 MG CAPSULE    Take 1 capsule  by mouth 2 times daily for 10 days    PHENAZOPYRIDINE (PYRIDIUM) 200 MG TABLET    Take 1 tablet by mouth 3 times daily as needed for Pain (bladder spasm/pain)       DISCONTINUED MEDICATIONS:  Discontinued Medications    No medications on file              (Please note that portions of this note were completed with a voice recognition program.  Efforts were made to edit the dictations but occasionally words are mis-transcribed.)    Rema Fendt, APRN - CNP (electronically signed)            Rema Fendt, APRN - CNP  08/30/22 0304

## 2022-08-30 ENCOUNTER — Inpatient Hospital Stay: Admit: 2022-08-30 | Discharge: 2022-08-30 | Disposition: A | Discharge status: Home / Self Care | Payer: MEDICARE

## 2022-08-30 LAB — COMPREHENSIVE METABOLIC PANEL
ALT: 61 U/L — ABNORMAL HIGH (ref 10–40)
AST: 50 U/L — ABNORMAL HIGH (ref 15–37)
Albumin/Globulin Ratio: 1 — ABNORMAL LOW (ref 1.1–2.2)
Albumin: 4 g/dL (ref 3.4–5.0)
Alkaline Phosphatase: 96 U/L (ref 40–129)
Anion Gap: 14 (ref 3–16)
BUN: 25 mg/dL — ABNORMAL HIGH (ref 7–20)
CO2: 30 mmol/L (ref 21–32)
Calcium: 10 mg/dL (ref 8.3–10.6)
Chloride: 99 mmol/L (ref 99–110)
Creatinine: 1.1 mg/dL (ref 0.6–1.2)
Est, Glom Filt Rate: 56 — AB (ref >60)
Glucose: 129 mg/dL — ABNORMAL HIGH (ref 70–99)
Potassium: 3.3 mmol/L — ABNORMAL LOW (ref 3.5–5.1)
Sodium: 143 mmol/L (ref 136–145)
Total Bilirubin: <0.2 mg/dL (ref 0.0–1.0)
Total Protein: 8.1 g/dL (ref 6.4–8.2)

## 2022-08-30 LAB — URINALYSIS WITH REFLEX TO CULTURE
Bilirubin Urine: NEGATIVE
Blood, Urine: NEGATIVE
Glucose, Ur: >=1000 mg/dL — AB
Nitrite, Urine: NEGATIVE
Protein, UA: 30 mg/dL — AB
Specific Gravity, UA: >=1.03 (ref 1.005–1.030)
Urobilinogen, Urine: 1 E.U./dL (ref <2.0)
pH, UA: 6 (ref 5.0–8.0)

## 2022-08-30 LAB — LACTIC ACID: Lactic Acid: 1.5 mmol/L (ref 0.4–2.0)

## 2022-08-30 LAB — LACTATE, SEPSIS
Lactic Acid, Sepsis: 3.3 mmol/L — ABNORMAL HIGH (ref 0.4–1.9)
Lactic Acid, Sepsis: 3.3 mmol/L — ABNORMAL HIGH (ref 0.4–1.9)

## 2022-08-30 LAB — CBC WITH AUTO DIFFERENTIAL
Basophils %: 1.1 %
Basophils Absolute: 0.1 10*3/uL (ref 0.0–0.2)
Eosinophils %: 1.5 %
Eosinophils Absolute: 0.1 10*3/uL (ref 0.0–0.6)
Hematocrit: 41.1 % (ref 36.0–48.0)
Hemoglobin: 13.4 g/dL (ref 12.0–16.0)
Lymphocytes %: 25.3 %
Lymphocytes Absolute: 2.3 10*3/uL (ref 1.0–5.1)
MCH: 29 pg (ref 26.0–34.0)
MCHC: 32.6 g/dL (ref 31.0–36.0)
MCV: 88.7 fL (ref 80.0–100.0)
MPV: 9.1 fL (ref 5.0–10.5)
Monocytes %: 9.9 %
Monocytes Absolute: 0.9 10*3/uL (ref 0.0–1.3)
Neutrophils %: 62.2 %
Neutrophils Absolute: 5.6 10*3/uL (ref 1.7–7.7)
Platelets: 246 10*3/uL (ref 135–450)
RBC: 4.63 M/uL (ref 4.00–5.20)
RDW: 13.9 % (ref 12.4–15.4)
WBC: 9 10*3/uL (ref 4.0–11.0)

## 2022-08-30 LAB — MICROSCOPIC URINALYSIS
Bacteria, UA: NONE SEEN /HPF
Epithelial Cells, UA: 2 /HPF (ref 0–5)
Hyaline Casts, UA: 0 /LPF (ref 0–8)
RBC, UA: 0 /HPF (ref 0–4)
WBC, UA: 74 /HPF — ABNORMAL HIGH (ref 0–5)

## 2022-08-30 MED ORDER — ACETAMINOPHEN 500 MG PO TABS
500 MG | Freq: Once | ORAL | Status: AC
Start: 2022-08-30 — End: 2022-08-30
  Administered 2022-08-30: 04:00:00 1000 mg via ORAL

## 2022-08-30 MED ORDER — SODIUM CHLORIDE 0.9% BOLUS (FLUID RESUSCITATION)
0.9 % | Freq: Once | INTRAVENOUS | Status: AC
Start: 2022-08-30 — End: 2022-08-30
  Administered 2022-08-30: 04:00:00 1000 mL via INTRAVENOUS

## 2022-08-30 MED ORDER — SODIUM CHLORIDE 0.9 % IV BOLUS
0.9 % | Freq: Once | INTRAVENOUS | Status: AC
Start: 2022-08-30 — End: 2022-08-30
  Administered 2022-08-30: 05:00:00 1000 mL via INTRAVENOUS

## 2022-08-30 MED ORDER — NITROFURANTOIN MONOHYD MACRO 100 MG PO CAPS
100 MG | Freq: Once | ORAL | Status: AC
Start: 2022-08-30 — End: 2022-08-29
  Administered 2022-08-30: 03:00:00 100 mg via ORAL

## 2022-08-30 MED ORDER — NITROFURANTOIN MONOHYD MACRO 100 MG PO CAPS
100 MG | ORAL_CAPSULE | Freq: Two times a day (BID) | ORAL | 0 refills | Status: AC
Start: 2022-08-30 — End: 2022-09-09

## 2022-08-30 MED ORDER — PHENAZOPYRIDINE HCL 200 MG PO TABS
200 MG | ORAL_TABLET | Freq: Three times a day (TID) | ORAL | 0 refills | Status: AC | PRN
Start: 2022-08-30 — End: 2022-09-02

## 2022-08-30 MED FILL — NITROFURANTOIN MONOHYD MACRO 100 MG PO CAPS: 100 MG | ORAL | Qty: 1

## 2022-08-30 MED FILL — ACETAMINOPHEN EXTRA STRENGTH 500 MG PO TABS: 500 MG | ORAL | Qty: 2

## 2022-08-31 LAB — CULTURE, URINE: Urine Culture, Routine: <50000

## 2022-09-10 ENCOUNTER — Other Ambulatory Visit (HOSPITAL_BASED_OUTPATIENT_CLINIC_OR_DEPARTMENT_OTHER): Payer: Self-pay | Admitting: Family Medicine

## 2022-09-10 ENCOUNTER — Other Ambulatory Visit: Payer: Self-pay | Admitting: Family Medicine

## 2022-09-10 DIAGNOSIS — R911 Solitary pulmonary nodule: Secondary | ICD-10-CM

## 2022-09-14 ENCOUNTER — Encounter (HOSPITAL_COMMUNITY): Payer: Self-pay

## 2022-09-14 ENCOUNTER — Ambulatory Visit (HOSPITAL_COMMUNITY): Payer: Medicare HMO

## 2022-09-16 ENCOUNTER — Ambulatory Visit (HOSPITAL_COMMUNITY): Payer: Medicare HMO

## 2022-10-13 ENCOUNTER — Inpatient Hospital Stay: Admission: RE | Admit: 2022-10-13 | Payer: Medicare HMO | Source: Ambulatory Visit

## 2022-10-14 ENCOUNTER — Other Ambulatory Visit: Payer: Self-pay | Admitting: Family Medicine

## 2022-10-14 DIAGNOSIS — R911 Solitary pulmonary nodule: Secondary | ICD-10-CM

## 2022-10-15 ENCOUNTER — Ambulatory Visit
Admission: RE | Admit: 2022-10-15 | Discharge: 2022-10-15 | Disposition: A | Discharge status: Home / Self Care | Payer: Medicare HMO | Source: Ambulatory Visit | Attending: Family Medicine | Admitting: Family Medicine

## 2022-10-15 ENCOUNTER — Other Ambulatory Visit: Payer: Self-pay | Admitting: Family Medicine

## 2022-10-15 DIAGNOSIS — R911 Solitary pulmonary nodule: Secondary | ICD-10-CM

## 2022-10-15 MED ORDER — IOPAMIDOL (ISOVUE-300) INJECTION 61%
75.0000 mL | Freq: Once | INTRAVENOUS | Status: AC | PRN
  Administered 2022-10-15: 75 mL via INTRAVENOUS

## 2022-10-21 ENCOUNTER — Telehealth: Payer: Self-pay

## 2022-10-21 NOTE — Telephone Encounter (Signed)
Patient Alisha Martinez on triage phone stating she needs appt with Dr Marlou Sa. Stated that someone told her there was a referral? Can you please call her to schedule? She left no details what was needing to be seen for. [phone]

## 2022-10-26 ENCOUNTER — Ambulatory Visit: Payer: Medicare HMO | Admitting: Orthopaedic Surgery

## 2022-11-01 ENCOUNTER — Other Ambulatory Visit: Payer: Self-pay | Admitting: *Deleted

## 2022-11-01 DIAGNOSIS — G35 Multiple sclerosis: Secondary | ICD-10-CM

## 2022-11-01 MED ORDER — REBIF 44 MCG/0.5ML ~~LOC~~ SOSY: 44.0000 ug | PREFILLED_SYRINGE | SUBCUTANEOUS | 1 refills | Status: DC

## 2022-11-08 ENCOUNTER — Telehealth: Payer: Self-pay | Admitting: Orthopedic Surgery

## 2022-11-08 ENCOUNTER — Encounter: Payer: Self-pay | Admitting: Orthopaedic Surgery

## 2022-11-08 ENCOUNTER — Ambulatory Visit (INDEPENDENT_AMBULATORY_CARE_PROVIDER_SITE_OTHER): Payer: Medicare HMO | Admitting: Orthopaedic Surgery

## 2022-11-08 ENCOUNTER — Ambulatory Visit: Payer: Self-pay

## 2022-11-08 DIAGNOSIS — M25551 Pain in right hip: Secondary | ICD-10-CM | POA: Diagnosis not present

## 2022-11-08 DIAGNOSIS — M1611 Unilateral primary osteoarthritis, right hip: Secondary | ICD-10-CM | POA: Diagnosis not present

## 2022-11-08 MED ORDER — TRAMADOL HCL 50 MG PO TABS: 50.0000 mg | ORAL_TABLET | Freq: Four times a day (QID) | ORAL | 0 refills | Status: DC | PRN

## 2022-11-08 NOTE — Telephone Encounter (Signed)
Pt called stating this is a second opinion appt. Told pt she need to obtain her medical records from other ortho facility before being seen. She is going to contact Her original ortho office to have records faxed. If unable to get records pt needs to reschedule until we have records.

## 2022-11-08 NOTE — Progress Notes (Signed)
The patient is a 66 year old female that had an appointment with my partner Dr. 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  She is someone with MS and a BMI of 38.  She is a diabetic but her hemoglobin A1c in April was 6.3.  She states has been a little elevated since then but it is below the 7 still.  She does ambulate with a rolling walker.  Her pain has been getting worse after a fall in June or July of this year.  She does have pain around her right hip and groin area on the right side.  She does take Tylenol for pain and this helps some.  She has had an intra-articular steroid injection of the right hip about 6 months ago and this helped temporize the pain for short amount of time.  She does have a history of low back pain.  There is been no history of DVT or PE.  She did have her daughter on the phone to discuss with [place] what is going on with her hip.  There is currently listed no fever, chills, nausea, vomiting.  She has had no chest pain or shortness of breath.  I did review all of her notes within epic that I can find.  She moved to [facility] from [place].  On exam her left hip moves smoothly and fluidly and is pain-free with internal and external rotation.  Her right hip has significant and severe pain with any attempts of internal and external rotation and there is significant stiffness with motion of her right hip.  AP pelvis and lateral right hip were reviewed with her.  There is severe end-stage arthritis of her right hip.  There is complete loss of joint space.  There are osteophytes and cystic changes in the femoral head and acetabulum.  The left hip still has joint space remaining.  We had a long and thorough discussion about hip replacement surgery.  I gave her handout about hip replacement surgery.  We talked about the risk and benefits of the surgery.  I described what to expect from an intraoperative and postoperative course.  I will send in some  tramadol for pain.  At some point she may benefit from a power mobility device given her MS.  She is interested in hip replacement surgery so we will work on getting this scheduled in the near future.  All question concerns were answered and addressed

## 2022-11-11 ENCOUNTER — Ambulatory Visit: Payer: Medicare HMO | Admitting: Podiatry

## 2022-11-11 ENCOUNTER — Ambulatory Visit (INDEPENDENT_AMBULATORY_CARE_PROVIDER_SITE_OTHER): Payer: Medicare HMO | Admitting: Podiatry

## 2022-11-11 DIAGNOSIS — B351 Tinea unguium: Secondary | ICD-10-CM

## 2022-11-11 DIAGNOSIS — M79674 Pain in right toe(s): Secondary | ICD-10-CM

## 2022-11-11 DIAGNOSIS — E1142 Type 2 diabetes mellitus with diabetic polyneuropathy: Secondary | ICD-10-CM | POA: Diagnosis not present

## 2022-11-11 DIAGNOSIS — M79675 Pain in left toe(s): Secondary | ICD-10-CM | POA: Diagnosis not present

## 2022-11-11 NOTE — Progress Notes (Addendum)
  Subjective:  Patient ID: Alisha Martinez, female    DOB: [birth year],  MRN: [id number]  Chief Complaint  Patient presents with   Nail Problem    diabetic foot care evaluation-nail trim    66 y.o. female presents with the above complaint. History confirmed with patient. Patient presenting with pain related to dystrophic thickened elongated nails. Patient is unable to trim own nails related to nail dystrophy and/or mobility issues. Patient does  have a history of T2DM. Denies pain or open wounds.   Objective:  Physical Exam: warm, good capillary refill nail exam onychomycosis of the toenails, onycholysis, and dystrophic nails DP pulses palpable, PT pulses palpable, and protective sensation intact Left Foot:  Pain with palpation of nails due to elongation and dystrophic growth.  Right Foot: Pain with palpation of nails due to elongation and dystrophic growth.   Assessment:   1. Pain due to onychomycosis of toenails of both feet   2. DM type 2 with diabetic peripheral neuropathy (Alisha Martinez)      Plan:  Patient was evaluated and treated and all questions answered.  Patient educated on diabetes. Discussed proper diabetic foot care and discussed risks and complications of disease. Educated patient in depth on reasons to return to the office immediately should he/she discover anything concerning or new on the feet. All questions answered. Discussed proper shoes as well.   #Onychomycosis with pain  -Nails palliatively debrided as below. -Educated on self-care  Procedure: Nail Debridement Rationale: Pain Type of Debridement: manual, sharp debridement. Instrumentation: Nail nipper, rotary burr. Number of Nails: 10  Return in about 3 months (around 02/11/2023) for [facility].         Everitt Amber, DPM [facility]

## 2022-11-22 ENCOUNTER — Encounter: Payer: Self-pay | Admitting: Neurology

## 2022-11-22 ENCOUNTER — Ambulatory Visit: Payer: Medicare HMO | Admitting: Neurology

## 2022-11-22 ENCOUNTER — Telehealth: Payer: Self-pay | Admitting: Neurology

## 2022-11-22 VITALS — BP 176/100 | HR 98 | Ht 64.0 in | Wt 216.0 lb

## 2022-11-22 DIAGNOSIS — M79605 Pain in left leg: Secondary | ICD-10-CM

## 2022-11-22 DIAGNOSIS — G35 Multiple sclerosis: Secondary | ICD-10-CM | POA: Diagnosis not present

## 2022-11-22 DIAGNOSIS — R200 Anesthesia of skin: Secondary | ICD-10-CM

## 2022-11-22 DIAGNOSIS — M79604 Pain in right leg: Secondary | ICD-10-CM

## 2022-11-22 DIAGNOSIS — R39198 Other difficulties with micturition: Secondary | ICD-10-CM

## 2022-11-22 DIAGNOSIS — H539 Unspecified visual disturbance: Secondary | ICD-10-CM

## 2022-11-22 DIAGNOSIS — R5383 Other fatigue: Secondary | ICD-10-CM | POA: Insufficient documentation

## 2022-11-22 NOTE — Telephone Encounter (Signed)
Referral sent to [facility], phone # [phone]. 

## 2022-11-22 NOTE — Progress Notes (Signed)
You note we do sometimes use the  [facility]  PATIENT: Alisha Martinez DOB: 1956/02/29  REFERRING DOCTOR OR PCP: Kristie Cowman, MD SOURCE: Patient, notes from [facility] neurology, imaging and lab reports  _________________________________   HISTORICAL  CHIEF COMPLAINT:  Chief Complaint  Patient presents with   Follow-up    RM 2, with daughter. Rebif three times weekly. C/o vision problems.     HISTORY OF PRESENT ILLNESS:  Alisha Martinez is a 66 y.o. woman with MS.  UPDATE 11/22/2022 She is on Rebif and tolerates it well..   While in [place], she had an episode of slurred speech and weakness x a coupe hours and her daughter thought her behavior was erratic x 2 days. She went to the ED had MRI of the cervical spine and brain.  Cervical spine was read as being normal and there is no spinal stenosis.  The brain MRI showed no acute findings and stable T2 hyperintense foci consistent with MS   She has notice that vision is worsening.   She had surgery 01/2021 (retinal tear) and noticed improvement at first but then gradual worsening.  She also has noted some issues in the other eyes.     She feels her gait is mildly reduced and she uses a cane . She feels the right hip arthritis is bothering her gait more than the MS.      She also has LBP and neck pain.   She has muscle spasms and takes baclofen.   She denies arm weakness but legs sometimes feel weak.  She also notes knee and hip pain.   Some dysesthesias.   She takes pregabalin 100 mg po tid.   She denies much clumsiness.     She has urinary frequency and is on 15 mg oxybutynin and Myrbetriq.  She saw urology and a nerve stimulator was discussed.    .    She has fatigue.   She has OSA and was on CPAP but no longer has it.  She prefers not to use CPAP.  She did not feel any better with CPAP.  She wakes up frequently to use the bathroom.    She reports some short term memory issues.    She denies depression now  but had some in 2020 with Covid.     She has essential hypertension and type 2 NIDDM.   She also has known knee and hip degenerative changes.  MRI cervical spine 03/27/2022 showed a normal spinal cord and mild DJD   MRI brain 03/27/2022 showed no acute findings.   She had stable T2 hyperintense foci.    She has hip DJD and saw Ortho and will be getting hip surgery.    There has been delay as medical clearance took a while to get an appt.       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   She needed to rest much more between tasks.   Over the previous 2 years, she also had issues with short term memory and tingling in her limbs.   She had MRI's in 2004 that were concerning for MS.  She was diagnosed in [place] at the [facility]    She never had an LP.   She was started on Rebif and has tolerated it well.  She has had no major exacerbations.  However,  she reports  a few exacerbations with leg and hand tingling or more pain in the legs.    She had her most recent 'exacerbation'  08/31/2021 and went to the ED. at the time, she was reporting itching and tingling throughout her body that started on 08/30/2021.  In the emergency room, she was given prednisone and oxycodone and she felt better a few hours later with resolution of symptoms back to her baseline.  She had no focal deficits in the emergency room.  Liver function test showed mild elevated transaminases.  CBC and UA were fine.  Of note, a review of imaging reports performed at [facility] between 2016 and 2019 showed nonspecific changes in the brain described as being more consistent with chronic microvascular ischemic change.  Although the MRI of the cervical spine from 2022 reportedly found patchy areas of abnormal signal within the cord, more conspicuous than prior examinations, MRI from 2019 and 2017 showed a  normal spinal cord. She was hospitalized with Covid-19 in July 2020 for 1 week.  She needed O2 but was not intubated.   She has a niece with MS.  Imaging  MRI cervical spine 03/27/2022 showed a normal spinal cord and mild DJD     MRI brain 03/27/2022 showed no acute findings.   She had stable T2 hyperintense foci.    MRI cervical spine 04/15/2021 showed 1 T2 hyperintense focus centrally towards the left adjacent to C3.  Some degenerative changes are noted but no significant spinal stenosis or nerve root compression.  MRI of the head 04/13/2021 showed  multiple T2/FLAIR hyperintense foci predominantly in the deep white matter but also in the periventricular and subcortical white matter.  Confluent foci are noted in the periatrial white matter as well as in the pons.  Some of the foci could be consistent with demyelination though the vast majority are more consistent with chronic microvascular ischemic change  MRI of the lumbar spine 09/03/2018 showed "Multilevel degenerative disc disease and facet arthropathy. Grade 1 lumbar canal stenosis at L3-4   Bilateral grade 1 foraminal stenosis at L4-5"  MRI of the brain 09/02/2018 showed "Mild to moderate periventricular and subcortical white matter T2/FLAIR hyperintensities are likely related to chronic small vessel ischemic disease.  There is no restricted diffusion to suggest acute infarct.  No hemorrhagic products "  MRI of the cervical spine 05/01/2018 showed "No focal abnormal cord signal or enhancement.  Mild degenerative changes, most pronounced at C4-C5 with facet arthropathy resulting in suspected bilateral neural foraminal narrowing. "  MRI of the thoracic spine 05/01/2018 showed "No definite abnormal spinal cord signal or enhancement.  Mild epidural lipomatosis, unchanged.  "  MRI of the brain 05/01/2018 showed "Unchanged moderate MS plaque burden when compared to 09/04/2015. No evidence for active demyelination.  Suspect superimposed chronic small vessel  ischemic changes. "  MRI of the cervical spine 09/02/2016 showed "In the pons there is a fairly extensive signal abnormality redemonstrated and unchanged from the prior MRI C-spine on 04/08/2014.   The cervical spinal cord demonstrates normal signal morphology without any abnormal areas of enhancement. "  REVIEW OF SYSTEMS: Constitutional: No fevers, chills, sweats, or change in appetite.  She has fatigue. Eyes: No visual changes, double vision, eye pain Ear, nose and throat: No hearing loss, ear pain, nasal congestion, sore throat Cardiovascular: No chest pain, palpitations Respiratory:  No shortness of breath at rest or with exertion.   No wheezes.  She has obstructive sleep apnea and used to use CPAP  but no longer does so. GastrointestinaI: No nausea, vomiting, diarrhea, abdominal pain, fecal incontinence Genitourinary:  No dysuria, urinary retention or frequency.  No nocturia. Musculoskeletal:  No neck pain, back pain Integumentary: No rash, pruritus, skin lesions Neurological: as above Psychiatric: No depression at this time.  No anxiety Endocrine: No palpitations, diaphoresis, change in appetite, change in weigh or increased thirst Hematologic/Lymphatic:  No anemia, purpura, petechiae. Allergic/Immunologic: No itchy/runny eyes, nasal congestion, recent allergic reactions, rashes  ALLERGIES: Allergies  Allergen Reactions   Compazine [Prochlorperazine]    Lisinopril     HOME MEDICATIONS:  Current Outpatient Medications:    aspirin 81 MG chewable tablet, Chew 81 mg by mouth daily., Disp: , Rfl:    atorvastatin (LIPITOR) 80 MG tablet, Take 80 mg by mouth daily., Disp: , Rfl:    baclofen (LIORESAL) 10 MG tablet, Take 10 mg by mouth 3 (three) times daily., Disp: , Rfl:    chlorthalidone (HYGROTON) 25 MG tablet, Take 25 mg by mouth daily., Disp: , Rfl:    diclofenac (VOLTAREN) 75 MG EC tablet, Take 75 mg by mouth 2 (two) times daily., Disp: , Rfl:    ergocalciferol (VITAMIN D2) 1.25 MG  (50000 UT) capsule, Take 50,000 Units by mouth every Friday., Disp: , Rfl:    interferon beta-1a (REBIF) 44 MCG/0.5ML SOSY injection, Inject 0.5 mLs (44 mcg total) into the skin every Monday, Wednesday, and Friday., Disp: 18 mL, Rfl: 1   metFORMIN (GLUCOPHAGE) 1000 MG tablet, Take 1,000 mg by mouth 2 (two) times daily with a meal., Disp: , Rfl:    mirabegron ER (MYRBETRIQ) 50 MG TB24 tablet, Take 1 tablet (50 mg total) by mouth daily., Disp: 30 tablet, Rfl: 11   oxybutynin (DITROPAN XL) 15 MG 24 hr tablet, Take 15 mg by mouth at bedtime., Disp: , Rfl:    potassium chloride SA (KLOR-CON) 20 MEQ tablet, Take 20 mEq by mouth daily., Disp: , Rfl:    pregabalin (LYRICA) 100 MG capsule, Take 100 mg by mouth 3 (three) times daily. Brand name only, Disp: , Rfl:    traMADol (ULTRAM) 50 MG tablet, Take 1-2 tablets (50-100 mg total) by mouth every 6 (six) hours as needed., Disp: 40 tablet, Rfl: 0  PAST MEDICAL HISTORY: Past Medical History:  Diagnosis Date   Asthma    Cancer ([place])    Diabetes mellitus without complication ([place])    Fibromyalgia    MS (multiple sclerosis) ([place])     PAST SURGICAL HISTORY: Past Surgical History:  Procedure Laterality Date   APPENDECTOMY     KNEE SURGERY      FAMILY HISTORY: Family History  Problem Relation Age of Onset   Alopecia Mother    Cancer Mother    Cancer Father    Diabetes Father    Cancer Sister     SOCIAL HISTORY:  Social History   Socioeconomic History   Marital status: Divorced    Spouse name: Not on file   Number of children: 2   Years of education: Not on file   Highest education level: High school graduate  Occupational History   Not on file  Tobacco Use   Smoking status: Never   Smokeless tobacco: Never  Substance and Sexual Activity   Alcohol use: Yes   Drug use: Never   Sexual activity: Not on file  Other Topics Concern   Not on file  Social History Narrative   Lives w daughter   Right handed   Caffeine: occa drinks tea  Social Determinants of Health   Financial Resource Strain: Not on file  Food Insecurity: Not on file  Transportation Needs: Not on file  Physical Activity: Not on file  Stress: Not on file  Social Connections: Not on file  Intimate Partner Violence: Not on file     PHYSICAL EXAM  Vitals:   11/22/22 0838  BP: (!) 176/100  Pulse: 98  Weight: 216 lb (98 kg)  Height: '5\' 4"'$  (1.626 m)    Body mass index is 37.08 kg/m.   General: The patient is well-developed and well-nourished and in no acute distress  HEENT:  Head is [place]/AT.  Sclera are anicteric.  Funduscopic exam shows normal optic discs and retinal vessels.  Skin: Extremities are without rash or  edema.  Neurologic Exam  Mental status: The patient is alert and oriented x 3 at the time of the examination. The patient has apparent normal recent and remote memory, with an apparently normal attention span and concentration ability.   Speech is normal.  Cranial nerves: Extraocular movements are full.  Facial strength and sensation is normal.. No obvious hearing deficits are noted.  Motor:  Muscle bulk is normal.   Tone is normal. Strength is  5 / 5 in all 4 extremities.   Sensory: Sensory testing is intact to pinprick, soft touch and vibration sensation in all 4 extremities.  Coordination: Cerebellar testing reveals good finger-nose-finger and heel-to-shin bilaterally.  Gait and station: Station is normal.   Gait is arthritic and mildly wide.  Her tandem gait is poor.  Romberg is negative.    Reflexes: Deep tendon reflexes are symmetric and normal in arms, trace at knees and absent in ankles.  Marland Kitchen    DIAGNOSTIC DATA (LABS, IMAGING, TESTING) - I reviewed patient records, labs, notes, testing and imaging myself where available.  Lab Results  Component Value Date   WBC 9.0 08/31/2021   HGB 14.8 08/31/2021   HCT 45.3 08/31/2021   MCV 90.6 08/31/2021   PLT 248 08/31/2021      Component Value Date/Time   NA 138  08/31/2021 0326   K 3.4 (L) 08/31/2021 0326   CL 100 08/31/2021 0326   CO2 27 08/31/2021 0326   GLUCOSE 132 (H) 08/31/2021 0326   BUN 19 08/31/2021 0326   CREATININE 0.95 08/31/2021 0326   CALCIUM 9.3 08/31/2021 0326   PROT 7.6 08/31/2021 0326   ALBUMIN 3.4 (L) 08/31/2021 0326   AST 61 (H) 08/31/2021 0326   ALT 65 (H) 08/31/2021 0326   ALKPHOS 62 08/31/2021 0326   BILITOT 0.6 08/31/2021 0326   GFRNONAA >60 08/31/2021 0326       ASSESSMENT AND PLAN  Multiple sclerosis ([place]) - Plan: Ambulatory referral to Ophthalmology  Vision disturbance - Plan: Ambulatory referral to Ophthalmology  Visual disturbance  Urinary dysfunction  Numbness  Pain in both lower extremities  Other fatigue   She will continue Rebif.  I do not think her most recent episode was an exacerbation.  We did discuss that many people in their 36s are able to stop disease modifying therapies and we could consider this if she is stable for a couple more years.  Consider another MRI in 2024 to see if further progression.  I will refer to ophthalmology since she has had some visual symptoms and she has a history of retinal tear requiring surgery. The active and exercise as tolerated. Return in 6 months or sooner if new or worsening symptoms.  Dondrell Loudermilk A. Felecia Shelling, MD, Gifford Shave 11/22/2022,  5:97 PM Certified in Neurology, Clinical Neurophysiology, Sleep Medicine and Neuroimaging  [facility] [address] [place], [place] [address] [phone]

## 2022-11-30 ENCOUNTER — Other Ambulatory Visit: Payer: Self-pay

## 2022-12-16 ENCOUNTER — Ambulatory Visit: Payer: Medicare HMO | Admitting: Dietician

## 2022-12-21 NOTE — Pre-Procedure Instructions (Signed)
Surgical Instructions    Your procedure is scheduled on Tuesday, January 2nd.  Report to [facility] Main Entrance "A" at 07:30 A.M., then check in with the Admitting office.  Call this number if you have problems the morning of surgery:  [phone]   If you have any questions prior to your surgery date call [phone]: Open Monday-Friday 8am-4pm    Remember:  Do not eat after midnight the night before your surgery  You may drink clear liquids until 06:30 AM the morning of your surgery.   Clear liquids allowed are: Water, Non-Citrus Juices (without pulp), Carbonated Beverages, Clear Tea, Black Coffee Only (NO MILK, CREAM OR POWDERED CREAMER of any kind), and Gatorade.    Take these medicines the morning of surgery with A SIP OF WATER  atorvastatin (LIPITOR)  baclofen (LIORESAL)  mirabegron ER (MYRBETRIQ)  pregabalin (LYRICA)    If needed: traMADol (ULTRAM)    Follow your surgeon's instructions on when to stop Aspirin.  If no instructions were given by your surgeon then you will need to call the office to get those instructions.     As of today, STOP taking any Aleve, Naproxen, Ibuprofen, Motrin, Advil, Goody's, BC's, all herbal medications, fish oil, and all vitamins. This includes diclofenac (VOLTAREN).   WHAT DO I DO ABOUT MY DIABETES MEDICATION?   Do not take metFORMIN (GLUCOPHAGE) the morning of surgery.  HOLD JARDIANCE FOR 72 HOURS PRIOR TO SURGERY. Last dose will be 12/29.        HOW TO MANAGE YOUR DIABETES BEFORE AND AFTER SURGERY  Why is it important to control my blood sugar before and after surgery? Improving blood sugar levels before and after surgery helps healing and can limit problems. A way of improving blood sugar control is eating a healthy diet by:  Eating less sugar and carbohydrates  Increasing activity/exercise  Talking with your doctor about reaching your blood sugar goals High blood sugars (greater than 180 mg/dL) can raise your risk of  infections and slow your recovery, so you will need to focus on controlling your diabetes during the weeks before surgery. Make sure that the doctor who takes care of your diabetes knows about your planned surgery including the date and location.  How do I manage my blood sugar before surgery? Check your blood sugar at least 4 times a day, starting 2 days before surgery, to make sure that the level is not too high or low.  Check your blood sugar the morning of your surgery when you wake up and every 2 hours until you get to the Short Stay unit.  If your blood sugar is less than 70 mg/dL, you will need to treat for low blood sugar: Do not take insulin. Treat a low blood sugar (less than 70 mg/dL) with  cup of clear juice (cranberry or apple), 4 glucose tablets, OR glucose gel. Recheck blood sugar in 15 minutes after treatment (to make sure it is greater than 70 mg/dL). If your blood sugar is not greater than 70 mg/dL on recheck, call [phone] for further instructions. Report your blood sugar to the short stay nurse when you get to Short Stay.  If you are admitted to the hospital after surgery: Your blood sugar will be checked by the staff and you will probably be given insulin after surgery (instead of oral diabetes medicines) to make sure you have good blood sugar levels. The goal for blood sugar control after surgery is 80-180 mg/dL.  Do NOT Smoke (Tobacco/Vaping) for 24 hours prior to your procedure.  If you use a CPAP at night, you may bring your mask/headgear for your overnight stay.   Contacts, glasses, piercing's, hearing aid's, dentures or partials may not be worn into surgery, please bring cases for these belongings.    For patients admitted to the hospital, discharge time will be determined by your treatment team.   Patients discharged the day of surgery will not be allowed to drive home, and someone needs to stay with them for 24 hours.  SURGICAL WAITING  ROOM VISITATION Patients having surgery or a procedure may have no more than 2 support people in the waiting area - these visitors may rotate.   Children under the age of 17 must have an adult with them who is not the patient. If the patient needs to stay at the hospital during part of their recovery, the visitor guidelines for inpatient rooms apply. Pre-op nurse will coordinate an appropriate time for 1 support person to accompany patient in pre-op.  This support person may not rotate.   Please refer to the [facility] website for the visitor guidelines for Inpatients (after your surgery is over and you are in a regular room).    Special instructions:   [facility]- Preparing For Surgery  Before surgery, you can play an important role. Because skin is not sterile, your skin needs to be as free of germs as possible. You can reduce the number of germs on your skin by washing with CHG (chlorahexidine gluconate) Soap before surgery.  CHG is an antiseptic cleaner which kills germs and bonds with the skin to continue killing germs even after washing.    Oral Hygiene is also important to reduce your risk of infection.  Remember - BRUSH YOUR TEETH THE MORNING OF SURGERY WITH YOUR REGULAR TOOTHPASTE  Please do not use if you have an allergy to CHG or antibacterial soaps. If your skin becomes reddened/irritated stop using the CHG.  Do not shave (including legs and underarms) for at least 48 hours prior to first CHG shower. It is OK to shave your face.  Please follow these instructions carefully.   Shower the NIGHT BEFORE SURGERY and the MORNING OF SURGERY  If you chose to wash your hair, wash your hair first as usual with your normal shampoo.  After you shampoo, rinse your hair and body thoroughly to remove the shampoo.  Use CHG Soap as you would any other liquid soap. You can apply CHG directly to the skin and wash gently with a scrungie or a clean washcloth.   Apply the CHG Soap to your body  ONLY FROM THE NECK DOWN.  Do not use on open wounds or open sores. Avoid contact with your eyes, ears, mouth and genitals (private parts). Wash Face and genitals (private parts)  with your normal soap.   Wash thoroughly, paying special attention to the area where your surgery will be performed.  Thoroughly rinse your body with warm water from the neck down.  DO NOT shower/wash with your normal soap after using and rinsing off the CHG Soap.  Pat yourself dry with a CLEAN TOWEL.  Wear CLEAN PAJAMAS to bed the night before surgery  Place CLEAN SHEETS on your bed the night before your surgery  DO NOT SLEEP WITH PETS.   Day of Surgery: Take a shower with CHG soap. Do not wear jewelry or makeup Do not wear lotions, powders, perfumes, or deodorant. Do not shave 48  hours prior to surgery.   Do not bring valuables to the hospital. [facility] is not responsible for any belongings or valuables. Do not wear nail polish, gel polish, artificial nails, or any other type of covering on natural nails (fingers and toes) If you have artificial nails or gel coating that need to be removed by a nail salon, please have this removed prior to surgery. Artificial nails or gel coating may interfere with anesthesia's ability to adequately monitor your vital signs. Wear Clean/Comfortable clothing the morning of surgery Remember to brush your teeth WITH YOUR REGULAR TOOTHPASTE.   Please read over the following fact sheets that you were given.    If you received a COVID test during your pre-op visit  it is requested that you wear a mask when out in public, stay away from anyone that may not be feeling well and notify your surgeon if you develop symptoms. If you have been in contact with anyone that has tested positive in the last 10 days please notify you surgeon.

## 2022-12-22 ENCOUNTER — Encounter (HOSPITAL_COMMUNITY)
Admission: RE | Admit: 2022-12-22 | Discharge: 2022-12-22 | Disposition: A | Discharge status: Home / Self Care | Payer: Medicare HMO | Source: Ambulatory Visit | Attending: Orthopaedic Surgery | Admitting: Orthopaedic Surgery

## 2022-12-22 ENCOUNTER — Telehealth: Payer: Self-pay | Admitting: Orthopaedic Surgery

## 2022-12-22 ENCOUNTER — Other Ambulatory Visit: Payer: Self-pay

## 2022-12-22 ENCOUNTER — Other Ambulatory Visit: Payer: Self-pay | Admitting: Orthopaedic Surgery

## 2022-12-22 ENCOUNTER — Encounter (HOSPITAL_COMMUNITY): Payer: Self-pay

## 2022-12-22 VITALS — BP 139/81 | HR 95 | Temp 97.8°F | Resp 18 | Ht 64.0 in | Wt 213.0 lb

## 2022-12-22 DIAGNOSIS — Z01812 Encounter for preprocedural laboratory examination: Secondary | ICD-10-CM | POA: Insufficient documentation

## 2022-12-22 DIAGNOSIS — Z01818 Encounter for other preprocedural examination: Secondary | ICD-10-CM

## 2022-12-22 DIAGNOSIS — E119 Type 2 diabetes mellitus without complications: Secondary | ICD-10-CM | POA: Diagnosis not present

## 2022-12-22 HISTORY — DX: Sleep apnea, unspecified: G47.30

## 2022-12-22 HISTORY — DX: Unspecified osteoarthritis, unspecified site: M19.90

## 2022-12-22 HISTORY — DX: Essential (primary) hypertension: I10

## 2022-12-22 LAB — CBC
HCT: 42.2 % (ref 36.0–46.0)
Hemoglobin: 13.4 g/dL (ref 12.0–15.0)
MCH: 28.9 pg (ref 26.0–34.0)
MCHC: 31.8 g/dL (ref 30.0–36.0)
MCV: 91.1 fL (ref 80.0–100.0)
Platelets: 245 10*3/uL (ref 150–400)
RBC: 4.63 MIL/uL (ref 3.87–5.11)
RDW: 14.7 % (ref 11.5–15.5)
WBC: 9.3 10*3/uL (ref 4.0–10.5)
nRBC: 0 % (ref 0.0–0.2)

## 2022-12-22 LAB — BASIC METABOLIC PANEL
Anion gap: 9 (ref 5–15)
BUN: 26 mg/dL — ABNORMAL HIGH (ref 8–23)
CO2: 30 mmol/L (ref 22–32)
Calcium: 9.6 mg/dL (ref 8.9–10.3)
Chloride: 101 mmol/L (ref 98–111)
Creatinine, Ser: 0.93 mg/dL (ref 0.44–1.00)
GFR, Estimated: >60 mL/min (ref >60)
Glucose, Bld: 163 mg/dL — ABNORMAL HIGH (ref 70–99)
Potassium: 2.9 mmol/L — ABNORMAL LOW (ref 3.5–5.1)
Sodium: 140 mmol/L (ref 135–145)

## 2022-12-22 LAB — TYPE AND SCREEN
ABO/RH(D): O POS
Antibody Screen: NEGATIVE

## 2022-12-22 LAB — SURGICAL PCR SCREEN
MRSA, PCR: NEGATIVE
Staphylococcus aureus: NEGATIVE

## 2022-12-22 LAB — GLUCOSE, CAPILLARY: Glucose-Capillary: 161 mg/dL — ABNORMAL HIGH (ref 70–99)

## 2022-12-22 MED ORDER — POTASSIUM CHLORIDE CRYS ER 20 MEQ PO TBCR: 20.0000 meq | EXTENDED_RELEASE_TABLET | Freq: Two times a day (BID) | ORAL | 0 refills | Status: DC

## 2022-12-22 NOTE — Telephone Encounter (Signed)
Alisha Martinez with Short Stay calling  with FYI:  Patient is scheduled right total hip on 12-28-22.  Potassium level is 2.9.  Please advise if patient needs to increase KCL 34mq.

## 2022-12-22 NOTE — Progress Notes (Addendum)
PCP - [facility] Cardiologist - denies  PPM/ICD - denies   Chest x-ray - 03/26/22 EKG - 03/27/22 Stress Test - pt has had one but unsure when ECHO - 03/26/22 Cardiac Cath - denies  Sleep Study - 10 + years ago per pt, OSA+ CPAP - denies  DM- Type 2 Pt just "spot checks" CBG, does not know average fasting levels  Last dose of GLP1 agonist-  n/a   Blood Thinner Instructions: n/a Aspirin Instructions: f/u with surgeon  ERAS Protcol - yes, no drink   COVID TEST- n/a   Anesthesia review: yes, records requested  Patient denies shortness of breath, fever, cough and chest pain at PAT appointment  Records requested from [facility] in [place], [place]. Pt states that's the only place that would have records  All instructions explained to the patient, with a verbal understanding of the material. Patient agrees to go over the instructions while at home for a better understanding.  The opportunity to ask questions was provided.

## 2022-12-22 NOTE — Telephone Encounter (Signed)
Called and advised pt.

## 2022-12-22 NOTE — Telephone Encounter (Signed)
Pt also stated she no longer going to her other pcp. [facility] is [facility] Their # is [phone]

## 2022-12-23 ENCOUNTER — Other Ambulatory Visit: Payer: Self-pay | Admitting: Physician Assistant

## 2022-12-23 DIAGNOSIS — Z01818 Encounter for other preprocedural examination: Secondary | ICD-10-CM

## 2022-12-23 LAB — HEMOGLOBIN A1C
Hgb A1c MFr Bld: 7.2 % — ABNORMAL HIGH (ref 4.8–5.6)
Mean Plasma Glucose: 160 mg/dL

## 2022-12-23 NOTE — Progress Notes (Signed)
Anesthesia Chart Review:  Follows with neurology for history of multiple sclerosis.  She is maintained on Rebif (interferon beta-1a).  History of OSA, not using CPAP.  Non-insulin-dependent DM2.  A1c 7.2 on preop labs.  Preop labs reviewed, mild hypokalemia potassium 2.9, otherwise unremarkable.  This result was called to surgeon's office and patient was directed to increase her Klor-Con.  EKG 03/27/2022 (Care Everywhere): Normal sinus rhythm.  Rate 77.  TTE 03/27/2022 (Care Everywhere): Summary:  Left ventricular wall motion is normal.  Overall left ventricular ejection fraction is estimated to be 55-60%.  Left ventricular systolic function is normal. (XTAV>/=69%)  Diastolic function is impaired and classified as Grade 1 (impaired  relaxation).  Injection of agitated saline showed no interatrial shunt.  Mild aortic sclerosis present without evidence of stenosis..  Normal bubble study with and without provocation.   Nuclear stress 07/11/2014 (Care Everywhere): Interpetation Summary:  The LV EF is 72%  Normal global and regional wall motion in all territories.    1.Non-diagnostic ECG for ischemia with pharmocologic stress.   2.Negative nuclear stress imaging for inducible ischemia.     Wynonia Musty [facility] Short Stay Center/Anesthesiology Phone [phone] 12/23/2022 11:27 AM

## 2022-12-23 NOTE — Anesthesia Preprocedure Evaluation (Addendum)
Anesthesia Evaluation  Patient identified by MRN, date of birth, ID band Patient awake    Reviewed: Allergy & Precautions, NPO status , Patient's Chart, lab work & pertinent test results  Airway Mallampati: II  TM Distance: >3 FB Neck ROM: Full    Dental  (+) Missing, Poor Dentition   Pulmonary asthma , sleep apnea    breath sounds clear to auscultation       Cardiovascular hypertension,  Rhythm:Regular Rate:Normal     Neuro/Psych negative neurological ROS  negative psych ROS   GI/Hepatic negative GI ROS, Neg liver ROS,,,  Endo/Other  diabetes, Type 2, Oral Hypoglycemic Agents    Renal/GU negative Renal ROS     Musculoskeletal   Abdominal   Peds  Hematology   Anesthesia Other Findings   Reproductive/Obstetrics                             Anesthesia Physical Anesthesia Plan  ASA: 3  Anesthesia Plan: Spinal   Post-op Pain Management: Minimal or no pain anticipated   Induction: Intravenous  PONV Risk Score and Plan: 0 and Propofol infusion and Midazolam  Airway Management Planned: Simple Face Mask and Natural Airway  Additional Equipment: None  Intra-op Plan:   Post-operative Plan:   Informed Consent: I have reviewed the patients History and Physical, chart, labs and discussed the procedure including the risks, benefits and alternatives for the proposed anesthesia with the patient or authorized representative who has indicated his/her understanding and acceptance.       Plan Discussed with: CRNA  Anesthesia Plan Comments: (Lab Results      Component                Value               Date                      WBC                      9.3                 12/22/2022                HGB                      13.4                12/22/2022                HCT                      42.2                12/22/2022                MCV                      91.1                12/22/2022                 PLT                      245                 12/22/2022  PAT note by Karoline Caldwell, PA-C: Follows with neurology for history of multiple sclerosis.  She is maintained on Rebif (interferon beta-1a).  History of OSA, not using CPAP.  Non-insulin-dependent DM2.  A1c 7.2 on preop labs.  Preop labs reviewed, mild hypokalemia potassium 2.9, otherwise unremarkable.  This result was called to surgeon's office and patient was directed to increase her Klor-Con.  EKG 03/27/2022 (Care Everywhere): Normal sinus rhythm.  Rate 77.  TTE 03/27/2022 (Care Everywhere): Summary:  Left ventricular wall motion is normal.  Overall left ventricular ejection fraction is estimated to be 55-60%.  Left ventricular systolic function is normal. (WUJW>/=11%)  Diastolic function is impaired and classified as Grade 1 (impaired  relaxation).  Injection of agitated saline showed no interatrial shunt.  Mild aortic sclerosis present without evidence of stenosis..  Normal bubble study with and without provocation.   Nuclear stress 07/11/2014 (Care Everywhere): Interpetation Summary:  The LV EF is 72%  Normal global and regional wall motion in all territories.   1.Non-diagnostic ECG for ischemia with pharmocologic stress.  2.Negative nuclear stress imaging for inducible ischemia.   )        Anesthesia Quick Evaluation

## 2022-12-27 NOTE — H&P (Signed)
TOTAL HIP ADMISSION H&P  Patient is admitted for right total hip arthroplasty.  Subjective:  Chief Complaint: right hip pain  HPI: Alisha Martinez, 67 y.o. female, has a history of pain and functional disability in the right hip(s) due to arthritis and patient has failed non-surgical conservative treatments for greater than 12 weeks to include NSAID's and/or analgesics, corticosteriod injections, flexibility and strengthening excercises, supervised PT with diminished ADL's post treatment, use of assistive devices, weight reduction as appropriate, and activity modification.  Onset of symptoms was abrupt starting 2 years ago with rapidlly worsening course since that time.The patient noted no past surgery on the right hip(s).  Patient currently rates pain in the right hip at 10 out of 10 with activity. Patient has night pain, worsening of pain with activity and weight bearing, trendelenberg gait, pain that interfers with activities of daily living, and pain with passive range of motion. Patient has evidence of subchondral cysts, subchondral sclerosis, periarticular osteophytes, and joint space narrowing by imaging studies. This condition presents safety issues increasing the risk of falls.  There is no current active infection.  Patient Active Problem List   Diagnosis Date Noted   Other fatigue 11/22/2022   Unilateral primary osteoarthritis, right hip 11/08/2022   Multiple sclerosis ([place]) 10/26/2021   Pain in both lower extremities 10/26/2021   Urinary dysfunction 10/26/2021   Numbness 10/26/2021   Past Medical History:  Diagnosis Date   Arthritis    Asthma    Cancer ([place])    "cancerous cells found on pap smear- removed surgically"   Diabetes mellitus without complication ([place])    Fibromyalgia    Hypertension    MS (multiple sclerosis) ([place])    Sleep apnea     Past Surgical History:  Procedure Laterality Date   APPENDECTOMY     EYE SURGERY Bilateral 2023   "repaired torn retina"   KNEE  SURGERY     VAGINA SURGERY     "pt states the doctor surgically removed cancerous cells from vaginal area"    No current facility-administered medications for this encounter.   Current Outpatient Medications  Medication Sig Dispense Refill Last Dose   aspirin 81 MG chewable tablet Chew 81 mg by mouth daily.      atorvastatin (LIPITOR) 80 MG tablet Take 80 mg by mouth daily.      baclofen (LIORESAL) 10 MG tablet Take 10 mg by mouth 3 (three) times daily.      chlorthalidone (HYGROTON) 25 MG tablet Take 25 mg by mouth daily.      diclofenac (VOLTAREN) 75 MG EC tablet Take 75 mg by mouth 2 (two) times daily.      ergocalciferol (VITAMIN D2) 1.25 MG (50000 UT) capsule Take 50,000 Units by mouth every Friday.      interferon beta-1a (REBIF) 44 MCG/0.5ML SOSY injection Inject 0.5 mLs (44 mcg total) into the skin every Monday, Wednesday, and Friday. 18 mL 1    JARDIANCE 25 MG TABS tablet Take 25 mg by mouth daily.      levocetirizine (XYZAL) 5 MG tablet Take 5 mg by mouth every evening.      metFORMIN (GLUCOPHAGE) 1000 MG tablet Take 1,000 mg by mouth 2 (two) times daily with a meal.      mirabegron ER (MYRBETRIQ) 50 MG TB24 tablet Take 1 tablet (50 mg total) by mouth daily. 30 tablet 11    oxybutynin (DITROPAN XL) 15 MG 24 hr tablet Take 15 mg by mouth at bedtime.  pregabalin (LYRICA) 100 MG capsule Take 100 mg by mouth 3 (three) times daily.      traMADol (ULTRAM) 50 MG tablet Take 1-2 tablets (50-100 mg total) by mouth every 6 (six) hours as needed. 40 tablet 0    potassium chloride SA (KLOR-CON M) 20 MEQ tablet Take 1 tablet (20 mEq total) by mouth 2 (two) times daily. 30 tablet 0    Allergies  Allergen Reactions   Compazine [Prochlorperazine]     Muscle pain   Lisinopril Swelling    Social History   Tobacco Use   Smoking status: Never   Smokeless tobacco: Never  Substance Use Topics   Alcohol use: Not Currently    Family History  Problem Relation Age of Onset   Alopecia  Mother    Cancer Mother    Cancer Father    Diabetes Father    Cancer Sister      Review of Systems  Musculoskeletal:  Positive for arthralgias, back pain and gait problem.    Objective:  Physical Exam Vitals reviewed.  Constitutional:      Appearance: Normal appearance. She is obese.  HENT:     Head: Normocephalic and atraumatic.  Eyes:     Extraocular Movements: Extraocular movements intact.     Pupils: Pupils are equal, round, and reactive to light.  Cardiovascular:     Rate and Rhythm: Normal rate and regular rhythm.  Pulmonary:     Breath sounds: Normal breath sounds.  Abdominal:     Palpations: Abdomen is soft.  Musculoskeletal:     Cervical back: Normal range of motion and neck supple.     Right hip: Tenderness and bony tenderness present. Decreased range of motion. Decreased strength.  Neurological:     Mental Status: She is alert and oriented to person, place, and time.  Psychiatric:        Behavior: Behavior normal.     Vital signs in last 24 hours:    Labs:   Estimated body mass index is 36.56 kg/m as calculated from the following:   Height as of 12/22/22: '5\' 4"'$  (1.626 m).   Weight as of 12/22/22: 96.6 kg.   Imaging Review Plain radiographs demonstrate severe degenerative joint disease of the right hip(s). The bone quality appears to be good for age and reported activity level.      Assessment/Plan:  End stage arthritis, right hip(s)  The patient history, physical examination, clinical judgement of the provider and imaging studies are consistent with end stage degenerative joint disease of the right hip(s) and total hip arthroplasty is deemed medically necessary. The treatment options including medical management, injection therapy, arthroscopy and arthroplasty were discussed at length. The risks and benefits of total hip arthroplasty were presented and reviewed. The risks due to aseptic loosening, infection, stiffness, dislocation/subluxation,   thromboembolic complications and other imponderables were discussed.  The patient acknowledged the explanation, agreed to proceed with the plan and consent was signed. Patient is being admitted for inpatient treatment for surgery, pain control, PT, OT, prophylactic antibiotics, VTE prophylaxis, progressive ambulation and ADL's and discharge planning.The patient is planning to be discharged home with home health services

## 2022-12-28 ENCOUNTER — Other Ambulatory Visit: Payer: Self-pay

## 2022-12-28 ENCOUNTER — Encounter (HOSPITAL_COMMUNITY): Payer: Self-pay | Admitting: Orthopaedic Surgery

## 2022-12-28 ENCOUNTER — Ambulatory Visit (HOSPITAL_BASED_OUTPATIENT_CLINIC_OR_DEPARTMENT_OTHER): Payer: Medicare PPO | Admitting: Physician Assistant

## 2022-12-28 ENCOUNTER — Encounter (HOSPITAL_COMMUNITY)
Admission: AD | Disposition: A | Discharge status: Skilled Nursing Facility | Payer: Self-pay | Source: Home / Self Care | Attending: Orthopaedic Surgery

## 2022-12-28 ENCOUNTER — Inpatient Hospital Stay (HOSPITAL_COMMUNITY)
Admission: AD | Admit: 2022-12-28 | Discharge: 2023-01-04 | DRG: 470 | Disposition: A | Discharge status: Skilled Nursing Facility | Payer: Medicare PPO | Attending: Orthopaedic Surgery | Admitting: Orthopaedic Surgery

## 2022-12-28 ENCOUNTER — Observation Stay (HOSPITAL_COMMUNITY): Payer: Medicare PPO

## 2022-12-28 ENCOUNTER — Ambulatory Visit (HOSPITAL_COMMUNITY): Payer: Medicare PPO

## 2022-12-28 ENCOUNTER — Ambulatory Visit (HOSPITAL_COMMUNITY): Payer: Medicare PPO | Admitting: Vascular Surgery

## 2022-12-28 DIAGNOSIS — Z7984 Long term (current) use of oral hypoglycemic drugs: Secondary | ICD-10-CM

## 2022-12-28 DIAGNOSIS — I700 Atherosclerosis of aorta: Secondary | ICD-10-CM | POA: Diagnosis present

## 2022-12-28 DIAGNOSIS — M1611 Unilateral primary osteoarthritis, right hip: Principal | ICD-10-CM | POA: Diagnosis present

## 2022-12-28 DIAGNOSIS — J45909 Unspecified asthma, uncomplicated: Secondary | ICD-10-CM | POA: Diagnosis present

## 2022-12-28 DIAGNOSIS — M797 Fibromyalgia: Secondary | ICD-10-CM | POA: Diagnosis present

## 2022-12-28 DIAGNOSIS — I10 Essential (primary) hypertension: Secondary | ICD-10-CM | POA: Diagnosis present

## 2022-12-28 DIAGNOSIS — N319 Neuromuscular dysfunction of bladder, unspecified: Secondary | ICD-10-CM | POA: Diagnosis present

## 2022-12-28 DIAGNOSIS — Z96641 Presence of right artificial hip joint: Secondary | ICD-10-CM

## 2022-12-28 DIAGNOSIS — Z9181 History of falling: Secondary | ICD-10-CM

## 2022-12-28 DIAGNOSIS — E876 Hypokalemia: Secondary | ICD-10-CM | POA: Diagnosis not present

## 2022-12-28 DIAGNOSIS — R39198 Other difficulties with micturition: Secondary | ICD-10-CM | POA: Diagnosis present

## 2022-12-28 DIAGNOSIS — E785 Hyperlipidemia, unspecified: Secondary | ICD-10-CM | POA: Diagnosis present

## 2022-12-28 DIAGNOSIS — Z7982 Long term (current) use of aspirin: Secondary | ICD-10-CM

## 2022-12-28 DIAGNOSIS — I471 Supraventricular tachycardia, unspecified: Secondary | ICD-10-CM | POA: Diagnosis not present

## 2022-12-28 DIAGNOSIS — Z888 Allergy status to other drugs, medicaments and biological substances status: Secondary | ICD-10-CM

## 2022-12-28 DIAGNOSIS — Z79899 Other long term (current) drug therapy: Secondary | ICD-10-CM

## 2022-12-28 DIAGNOSIS — Z9049 Acquired absence of other specified parts of digestive tract: Secondary | ICD-10-CM

## 2022-12-28 DIAGNOSIS — E119 Type 2 diabetes mellitus without complications: Secondary | ICD-10-CM | POA: Diagnosis present

## 2022-12-28 DIAGNOSIS — T783XXA Angioneurotic edema, initial encounter: Secondary | ICD-10-CM | POA: Diagnosis not present

## 2022-12-28 DIAGNOSIS — G35 Multiple sclerosis: Secondary | ICD-10-CM | POA: Diagnosis present

## 2022-12-28 DIAGNOSIS — R000 Tachycardia, unspecified: Secondary | ICD-10-CM | POA: Diagnosis not present

## 2022-12-28 DIAGNOSIS — F419 Anxiety disorder, unspecified: Secondary | ICD-10-CM | POA: Diagnosis present

## 2022-12-28 DIAGNOSIS — Z01818 Encounter for other preprocedural examination: Secondary | ICD-10-CM

## 2022-12-28 DIAGNOSIS — Z6836 Body mass index (BMI) 36.0-36.9, adult: Secondary | ICD-10-CM

## 2022-12-28 DIAGNOSIS — G4733 Obstructive sleep apnea (adult) (pediatric): Secondary | ICD-10-CM | POA: Diagnosis present

## 2022-12-28 HISTORY — PX: TOTAL HIP ARTHROPLASTY: SHX124

## 2022-12-28 LAB — ABO/RH: ABO/RH(D): O POS

## 2022-12-28 LAB — COMPREHENSIVE METABOLIC PANEL
ALT: 39 U/L (ref 0–44)
AST: 34 U/L (ref 15–41)
Albumin: 3.6 g/dL (ref 3.5–5.0)
Alkaline Phosphatase: 78 U/L (ref 38–126)
Anion gap: 12 (ref 5–15)
BUN: 18 mg/dL (ref 8–23)
CO2: 29 mmol/L (ref 22–32)
Calcium: 9.2 mg/dL (ref 8.9–10.3)
Chloride: 97 mmol/L — ABNORMAL LOW (ref 98–111)
Creatinine, Ser: 1 mg/dL (ref 0.44–1.00)
GFR, Estimated: >60 mL/min (ref >60)
Glucose, Bld: 125 mg/dL — ABNORMAL HIGH (ref 70–99)
Potassium: 2.9 mmol/L — ABNORMAL LOW (ref 3.5–5.1)
Sodium: 138 mmol/L (ref 135–145)
Total Bilirubin: 0.6 mg/dL (ref 0.3–1.2)
Total Protein: 7.5 g/dL (ref 6.5–8.1)

## 2022-12-28 LAB — GLUCOSE, CAPILLARY
Glucose-Capillary: 119 mg/dL — ABNORMAL HIGH (ref 70–99)
Glucose-Capillary: 121 mg/dL — ABNORMAL HIGH (ref 70–99)

## 2022-12-28 SURGERY — ARTHROPLASTY, HIP, TOTAL, ANTERIOR APPROACH
Anesthesia: Monitor Anesthesia Care | Site: Hip | Laterality: Right

## 2022-12-28 MED ORDER — ONDANSETRON HCL 4 MG/2ML IJ SOLN
4.0000 mg | Freq: Four times a day (QID) | INTRAMUSCULAR | Status: DC | PRN
  Administered 2022-12-28: 4 mg via INTRAVENOUS
  Filled 2022-12-28: qty 2

## 2022-12-28 MED ORDER — METOCLOPRAMIDE HCL 5 MG/ML IJ SOLN: 5.0000 mg | Freq: Three times a day (TID) | INTRAMUSCULAR | Status: DC | PRN

## 2022-12-28 MED ORDER — ONDANSETRON HCL 4 MG/2ML IJ SOLN: 4.0000 mg | Freq: Once | INTRAMUSCULAR | Status: DC | PRN

## 2022-12-28 MED ORDER — PROPOFOL 10 MG/ML IV BOLUS
INTRAVENOUS | Status: AC
  Filled 2022-12-28: qty 20

## 2022-12-28 MED ORDER — PROPOFOL 10 MG/ML IV BOLUS
INTRAVENOUS | Status: DC | PRN
  Administered 2022-12-28: 10 mg via INTRAVENOUS

## 2022-12-28 MED ORDER — INSULIN ASPART 100 UNIT/ML IJ SOLN: 0.0000 [IU] | INTRAMUSCULAR | Status: DC | PRN

## 2022-12-28 MED ORDER — EMPAGLIFLOZIN 25 MG PO TABS
25.0000 mg | ORAL_TABLET | Freq: Every day | ORAL | Status: DC
  Administered 2022-12-28 – 2022-12-31 (×4): 25 mg via ORAL
  Filled 2022-12-28 (×4): qty 1

## 2022-12-28 MED ORDER — 0.9 % SODIUM CHLORIDE (POUR BTL) OPTIME
TOPICAL | Status: DC | PRN
  Administered 2022-12-28: 1000 mL

## 2022-12-28 MED ORDER — ONDANSETRON HCL 4 MG PO TABS: 4.0000 mg | ORAL_TABLET | Freq: Four times a day (QID) | ORAL | Status: DC | PRN

## 2022-12-28 MED ORDER — POLYETHYLENE GLYCOL 3350 17 G PO PACK
17.0000 g | PACK | Freq: Every day | ORAL | Status: DC | PRN
  Filled 2022-12-28: qty 1

## 2022-12-28 MED ORDER — SODIUM CHLORIDE 0.9 % IV SOLN: INTRAVENOUS | Status: DC

## 2022-12-28 MED ORDER — SODIUM CHLORIDE 0.9 % IR SOLN
Status: DC | PRN
  Administered 2022-12-28: 3000 mL

## 2022-12-28 MED ORDER — POVIDONE-IODINE 10 % EX SWAB
2.0000 | Freq: Once | CUTANEOUS | Status: AC
  Administered 2022-12-28: 2 via TOPICAL

## 2022-12-28 MED ORDER — MIDAZOLAM HCL 2 MG/2ML IJ SOLN
INTRAMUSCULAR | Status: DC | PRN
  Administered 2022-12-28: 2 mg via INTRAVENOUS

## 2022-12-28 MED ORDER — PREGABALIN 100 MG PO CAPS
100.0000 mg | ORAL_CAPSULE | Freq: Three times a day (TID) | ORAL | Status: DC
  Administered 2022-12-28 – 2023-01-04 (×21): 100 mg via ORAL
  Filled 2022-12-28 (×21): qty 1

## 2022-12-28 MED ORDER — ACETAMINOPHEN 325 MG PO TABS
325.0000 mg | ORAL_TABLET | Freq: Four times a day (QID) | ORAL | Status: DC | PRN
  Administered 2023-01-02 (×2): 650 mg via ORAL
  Filled 2022-12-28 (×2): qty 2

## 2022-12-28 MED ORDER — CEFAZOLIN SODIUM-DEXTROSE 1-4 GM/50ML-% IV SOLN
1.0000 g | Freq: Four times a day (QID) | INTRAVENOUS | Status: AC
  Administered 2022-12-28 (×2): 1 g via INTRAVENOUS
  Filled 2022-12-28 (×2): qty 50

## 2022-12-28 MED ORDER — ORAL CARE MOUTH RINSE: 15.0000 mL | Freq: Once | OROMUCOSAL | Status: AC

## 2022-12-28 MED ORDER — FENTANYL CITRATE (PF) 250 MCG/5ML IJ SOLN
INTRAMUSCULAR | Status: AC
  Filled 2022-12-28: qty 5

## 2022-12-28 MED ORDER — LACTATED RINGERS IV SOLN: INTRAVENOUS | Status: DC

## 2022-12-28 MED ORDER — PHENOL 1.4 % MT LIQD: 1.0000 | OROMUCOSAL | Status: DC | PRN

## 2022-12-28 MED ORDER — TRANEXAMIC ACID-NACL 1000-0.7 MG/100ML-% IV SOLN
1000.0000 mg | INTRAVENOUS | Status: AC
  Administered 2022-12-28: 1000 mg via INTRAVENOUS
  Filled 2022-12-28: qty 100

## 2022-12-28 MED ORDER — MENTHOL 3 MG MT LOZG: 1.0000 | LOZENGE | OROMUCOSAL | Status: DC | PRN

## 2022-12-28 MED ORDER — ALUM & MAG HYDROXIDE-SIMETH 200-200-20 MG/5ML PO SUSP: 30.0000 mL | ORAL | Status: DC | PRN

## 2022-12-28 MED ORDER — MEPERIDINE HCL 25 MG/ML IJ SOLN: 6.2500 mg | INTRAMUSCULAR | Status: DC | PRN

## 2022-12-28 MED ORDER — DOCUSATE SODIUM 100 MG PO CAPS
100.0000 mg | ORAL_CAPSULE | Freq: Two times a day (BID) | ORAL | Status: DC
  Administered 2022-12-28 – 2023-01-04 (×14): 100 mg via ORAL
  Filled 2022-12-28 (×14): qty 1

## 2022-12-28 MED ORDER — CHLORTHALIDONE 25 MG PO TABS
25.0000 mg | ORAL_TABLET | Freq: Every day | ORAL | Status: DC
  Administered 2022-12-29 – 2022-12-31 (×3): 25 mg via ORAL
  Filled 2022-12-28 (×3): qty 1

## 2022-12-28 MED ORDER — MIRABEGRON ER 50 MG PO TB24
50.0000 mg | ORAL_TABLET | Freq: Every day | ORAL | Status: DC
  Administered 2022-12-29 – 2023-01-04 (×7): 50 mg via ORAL
  Filled 2022-12-28 (×7): qty 1

## 2022-12-28 MED ORDER — CEFAZOLIN SODIUM-DEXTROSE 2-4 GM/100ML-% IV SOLN
2.0000 g | INTRAVENOUS | Status: AC
  Administered 2022-12-28: 2 g via INTRAVENOUS
  Filled 2022-12-28: qty 100

## 2022-12-28 MED ORDER — METOCLOPRAMIDE HCL 5 MG PO TABS: 5.0000 mg | ORAL_TABLET | Freq: Three times a day (TID) | ORAL | Status: DC | PRN

## 2022-12-28 MED ORDER — HYDROMORPHONE HCL 1 MG/ML IJ SOLN: 0.5000 mg | INTRAMUSCULAR | Status: DC | PRN

## 2022-12-28 MED ORDER — PROPOFOL 500 MG/50ML IV EMUL
INTRAVENOUS | Status: DC | PRN
  Administered 2022-12-28: 75 ug/kg/min via INTRAVENOUS

## 2022-12-28 MED ORDER — PANTOPRAZOLE SODIUM 40 MG PO TBEC
40.0000 mg | DELAYED_RELEASE_TABLET | Freq: Every day | ORAL | Status: DC
  Administered 2022-12-28 – 2023-01-04 (×8): 40 mg via ORAL
  Filled 2022-12-28 (×8): qty 1

## 2022-12-28 MED ORDER — OXYCODONE HCL 5 MG PO TABS
5.0000 mg | ORAL_TABLET | ORAL | Status: DC | PRN
  Administered 2022-12-28 – 2022-12-30 (×3): 10 mg via ORAL
  Filled 2022-12-28 (×4): qty 2

## 2022-12-28 MED ORDER — ACETAMINOPHEN 325 MG PO TABS: 325.0000 mg | ORAL_TABLET | ORAL | Status: DC | PRN

## 2022-12-28 MED ORDER — ATORVASTATIN CALCIUM 80 MG PO TABS
80.0000 mg | ORAL_TABLET | Freq: Every day | ORAL | Status: DC
  Administered 2022-12-29 – 2023-01-04 (×7): 80 mg via ORAL
  Filled 2022-12-28 (×7): qty 1

## 2022-12-28 MED ORDER — KETOROLAC TROMETHAMINE 15 MG/ML IJ SOLN
15.0000 mg | Freq: Once | INTRAMUSCULAR | Status: AC
  Administered 2022-12-28: 15 mg via INTRAVENOUS
  Filled 2022-12-28: qty 1

## 2022-12-28 MED ORDER — BACLOFEN 10 MG PO TABS
10.0000 mg | ORAL_TABLET | Freq: Three times a day (TID) | ORAL | Status: DC
  Administered 2022-12-28 – 2023-01-04 (×21): 10 mg via ORAL
  Filled 2022-12-28 (×20): qty 1

## 2022-12-28 MED ORDER — FENTANYL CITRATE (PF) 250 MCG/5ML IJ SOLN
INTRAMUSCULAR | Status: DC | PRN
  Administered 2022-12-28: 25 ug via INTRAVENOUS

## 2022-12-28 MED ORDER — POTASSIUM CHLORIDE CRYS ER 20 MEQ PO TBCR
40.0000 meq | EXTENDED_RELEASE_TABLET | Freq: Two times a day (BID) | ORAL | Status: DC
  Administered 2022-12-28 (×2): 40 meq via ORAL
  Filled 2022-12-28 (×3): qty 2

## 2022-12-28 MED ORDER — METFORMIN HCL 500 MG PO TABS
1000.0000 mg | ORAL_TABLET | Freq: Two times a day (BID) | ORAL | Status: DC
  Administered 2022-12-28 – 2022-12-31 (×6): 1000 mg via ORAL
  Filled 2022-12-28 (×6): qty 2

## 2022-12-28 MED ORDER — OXYCODONE HCL 5 MG/5ML PO SOLN: 5.0000 mg | Freq: Once | ORAL | Status: DC | PRN

## 2022-12-28 MED ORDER — DIPHENHYDRAMINE HCL 12.5 MG/5ML PO ELIX: 12.5000 mg | ORAL_SOLUTION | ORAL | Status: DC | PRN

## 2022-12-28 MED ORDER — POTASSIUM CHLORIDE CRYS ER 20 MEQ PO TBCR
20.0000 meq | EXTENDED_RELEASE_TABLET | Freq: Two times a day (BID) | ORAL | Status: DC
  Filled 2022-12-28: qty 1

## 2022-12-28 MED ORDER — OXYCODONE HCL 5 MG PO TABS
10.0000 mg | ORAL_TABLET | ORAL | Status: DC | PRN
  Administered 2022-12-28: 10 mg via ORAL
  Filled 2022-12-28: qty 3

## 2022-12-28 MED ORDER — ASPIRIN 81 MG PO CHEW
81.0000 mg | CHEWABLE_TABLET | Freq: Two times a day (BID) | ORAL | Status: DC
  Administered 2022-12-28 – 2023-01-04 (×14): 81 mg via ORAL
  Filled 2022-12-28 (×14): qty 1

## 2022-12-28 MED ORDER — OXYBUTYNIN CHLORIDE ER 5 MG PO TB24
15.0000 mg | ORAL_TABLET | Freq: Every day | ORAL | Status: DC
  Administered 2022-12-28 – 2022-12-30 (×3): 15 mg via ORAL
  Filled 2022-12-28 (×3): qty 1

## 2022-12-28 MED ORDER — CHLORHEXIDINE GLUCONATE 0.12 % MT SOLN
15.0000 mL | Freq: Once | OROMUCOSAL | Status: AC
  Administered 2022-12-28: 15 mL via OROMUCOSAL
  Filled 2022-12-28: qty 15

## 2022-12-28 MED ORDER — BUPIVACAINE IN DEXTROSE 0.75-8.25 % IT SOLN
INTRATHECAL | Status: DC | PRN
  Administered 2022-12-28: 2 mL via INTRATHECAL

## 2022-12-28 MED ORDER — OXYCODONE HCL 5 MG PO TABS: 5.0000 mg | ORAL_TABLET | Freq: Once | ORAL | Status: DC | PRN

## 2022-12-28 MED ORDER — FENTANYL CITRATE (PF) 100 MCG/2ML IJ SOLN: 25.0000 ug | INTRAMUSCULAR | Status: DC | PRN

## 2022-12-28 MED ORDER — MIDAZOLAM HCL 2 MG/2ML IJ SOLN
INTRAMUSCULAR | Status: AC
  Filled 2022-12-28: qty 2

## 2022-12-28 MED ORDER — PHENYLEPHRINE HCL-NACL 20-0.9 MG/250ML-% IV SOLN
INTRAVENOUS | Status: DC | PRN
  Administered 2022-12-28: 25 ug/min via INTRAVENOUS

## 2022-12-28 MED ORDER — ACETAMINOPHEN 160 MG/5ML PO SOLN: 325.0000 mg | ORAL | Status: DC | PRN

## 2022-12-28 MED ORDER — ESMOLOL HCL 100 MG/10ML IV SOLN
INTRAVENOUS | Status: DC | PRN
  Administered 2022-12-28 (×2): 10 mg via INTRAVENOUS

## 2022-12-28 SURGICAL SUPPLY — 50 items
BENZOIN TINCTURE PRP APPL 2/3 (GAUZE/BANDAGES/DRESSINGS) ×1 IMPLANT
BLADE SAW SGTL 18X1.27X75 (BLADE) ×1 IMPLANT
COVER SURGICAL LIGHT HANDLE (MISCELLANEOUS) ×1 IMPLANT
CUP SECTOR GRIPTON 50MM (Cup) IMPLANT
DRAPE C-ARM 42X72 X-RAY (DRAPES) ×1 IMPLANT
DRAPE STERI IOBAN 125X83 (DRAPES) ×1 IMPLANT
DRAPE U-SHAPE 47X51 STRL (DRAPES) ×3 IMPLANT
DRSG AQUACEL AG ADV 3.5X10 (GAUZE/BANDAGES/DRESSINGS) ×1 IMPLANT
DURAPREP 26ML APPLICATOR (WOUND CARE) ×1 IMPLANT
ELECT BLADE 4.0 EZ CLEAN MEGAD (MISCELLANEOUS) ×1
ELECT REM PT RETURN 9FT ADLT (ELECTROSURGICAL) ×1
ELECTRODE BLDE 4.0 EZ CLN MEGD (MISCELLANEOUS) ×1 IMPLANT
ELECTRODE REM PT RTRN 9FT ADLT (ELECTROSURGICAL) ×1 IMPLANT
FACESHIELD WRAPAROUND (MASK) ×3 IMPLANT
FACESHIELD WRAPAROUND OR TEAM (MASK) ×2 IMPLANT
GLOVE BIOGEL PI IND STRL 8 (GLOVE) ×2 IMPLANT
GLOVE ECLIPSE 8.0 STRL XLNG CF (GLOVE) ×1 IMPLANT
GLOVE ORTHO TXT STRL SZ7.5 (GLOVE) ×2 IMPLANT
GOWN STRL REUS W/ TWL LRG LVL3 (GOWN DISPOSABLE) ×2 IMPLANT
GOWN STRL REUS W/ TWL XL LVL3 (GOWN DISPOSABLE) ×2 IMPLANT
GOWN STRL REUS W/TWL LRG LVL3 (GOWN DISPOSABLE) ×2
GOWN STRL REUS W/TWL XL LVL3 (GOWN DISPOSABLE) ×2
HANDPIECE INTERPULSE COAX TIP (DISPOSABLE) ×1
HEAD FEM STD 32X+5 STRL (Hips) IMPLANT
KIT BASIN OR (CUSTOM PROCEDURE TRAY) ×1 IMPLANT
KIT TURNOVER KIT B (KITS) ×1 IMPLANT
LINER ACET PNNCL PLUS4 NEUTRAL (Hips) IMPLANT
MANIFOLD NEPTUNE II (INSTRUMENTS) ×1 IMPLANT
NS IRRIG 1000ML POUR BTL (IV SOLUTION) ×1 IMPLANT
PACK TOTAL JOINT (CUSTOM PROCEDURE TRAY) ×1 IMPLANT
PAD ARMBOARD 7.5X6 YLW CONV (MISCELLANEOUS) ×1 IMPLANT
PINNACLE PLUS 4 NEUTRAL (Hips) ×1 IMPLANT
SCREW 6.5MMX25MM (Screw) IMPLANT
SET HNDPC FAN SPRY TIP SCT (DISPOSABLE) ×1 IMPLANT
STAPLER VISISTAT 35W (STAPLE) IMPLANT
STEM FEM ACTIS HIGH SZ3 (Stem) IMPLANT
STRIP CLOSURE SKIN 1/2X4 (GAUZE/BANDAGES/DRESSINGS) ×2 IMPLANT
SUT ETHIBOND NAB CT1 #1 30IN (SUTURE) ×1 IMPLANT
SUT ETHILON 2 0 FS 18 (SUTURE) IMPLANT
SUT VIC AB 0 CT1 27 (SUTURE) ×2
SUT VIC AB 0 CT1 27XBRD ANBCTR (SUTURE) ×1 IMPLANT
SUT VIC AB 1 CT1 27 (SUTURE) ×2
SUT VIC AB 1 CT1 27XBRD ANBCTR (SUTURE) ×1 IMPLANT
SUT VIC AB 2-0 CT1 27 (SUTURE) ×2
SUT VIC AB 2-0 CT1 TAPERPNT 27 (SUTURE) ×1 IMPLANT
TOWEL GREEN STERILE (TOWEL DISPOSABLE) ×1 IMPLANT
TOWEL GREEN STERILE FF (TOWEL DISPOSABLE) ×1 IMPLANT
TRAY FOLEY W/BAG SLVR 16FR (SET/KITS/TRAYS/PACK) ×1
TRAY FOLEY W/BAG SLVR 16FR ST (SET/KITS/TRAYS/PACK) IMPLANT
WATER STERILE IRR 1000ML POUR (IV SOLUTION) ×2 IMPLANT

## 2022-12-28 NOTE — Op Note (Signed)
Operative Note  Date of operation: 12/28/2022 Preoperative diagnosis: Right hip primary osteoarthritis Postoperative diagnosis: Same  Procedure: Right direct anterior total hip arthroplasty  Implants: DePuy sector GRIPTION acetabular opponent size 50, 32+4 polythene liner, size 3 Actis femoral component with high offset, 32+5 metal hip ball  Surgeon: Lind Guest. Ninfa Linden, MD Assistant: Benita Stabile, PA-C  Anesthesia: Spinal Antibiotics: 2 g IV Ancef EBL: 50 cc to 100 cc Complications: None  Indications: The patient is a very pleasant 67 year old female who unfortunately has multiple sclerosis and severe debilitating arthritis involving the right hip.  Her right hip has been assessed radiographically and clinically.  There is complete loss of joint space with cystic changes in the femoral head as well as bone-on-bone wear.  She does ambulate with a rolling walker and her right hip pain is daily and it is severely and detrimentally affecting her quality of life, her mobility and her actives daily living to the point she wishes to proceed with hip replacement and we agree with this as well.  She is still morbidly obese but has lost significant weight.  Her BMI is down to 36.73.  We talked in length in detail the risk of acute blood loss anemia, nerve vessel injury, fracture, infection, dislocation, DVT, implant failure, leg length differences and wound healing issues.  We talked about her goals being hopefully decrease pain, improve mobility, and improve quality of life.  Procedure description: After informed consent was obtained and the appropriate right hip was marked, the patient was brought to the operating room and set up on the stretcher where spinal anesthesia was obtained.  She was then laid in supine position on the stretcher and a Foley catheter was placed.  Traction boots were placed on both her feet and she was placed supine on the Hana fracture table with a perineal post in place in  both legs and inline skeletal traction devices no traction applied.  We did have to tape her pannus off to the side due to her obesity.  We then assessed her radiographically.  The right operative hip was prepped and draped with DuraPrep and sterile drapes.  A timeout was called and she identified as correct patient the correct right hip.  An incision was then made just inferior and posterior to the ASIS and carried slightly obliquely down the leg.  Dissection was carried down the tensor fascia lata muscle and the tensor fascia was then divided longitudinally to proceed with a direct interposed the hip.  Circumflex vessels were identified and cauterized.  The hip capsule identified and opened up in L-type format finding a moderate joint effusion.  Cobra retractors were placed around the medial lateral femoral neck and a femoral neck cut was made with oscillating saw just proximal to the lesser trochanter.  This was completed with an osteotome.  A corkscrew guide was placed in the femoral head and the femoral head was removed in its entirety.  There was a significant area devoid of cartilage completely.  A bent Hohmann was then placed over the medial acetabular rim and remnants of the acetabular labrum and other debris removed.  We then began reaming under direct visitation from a size 43 reamer and stepwise increments going up to a size 49 reamer with all reamers placed under direct visualization and direct fluoroscopy in order to gain the depth of reaming, the inclination and the anteversion.  The real DePuy sector [place] acetabular opponent size 50 was placed without difficulty and we then placed a  single acetabular bone screw.  A 32+4 polythene liner was then placed within the acetabular component based on her offset.  Attention was then turned to the femur.  With the right leg externally rotated to 120 degrees, extended and adducted, a Mueller retractor was placed medially and a Hohmann retractor was placed  behind the greater trochanter.  The lateral joint capsule was released and a box cutting osteotome was used to enter the femoral canal.  Broaching was then initiated from a size 0 broach going and stepwise increments to a size 3 broach.  We then trialed a standard offset femoral neck and a 32+1 trial hip ball.  This was reduced in the acetabulum and based on clinical exam and radiographic exam we needed more offset and leg length.  The hip was then dislocated and remove the trial components.  The real Actis femoral component size 3 was placed without difficulty but one with high offset.  We went with a 32+5 metal head ball.  This again was reduced into the pelvis and we are pleased with leg length, range of motion, offset and stability assessed both mechanically and radiographically.  The soft tissue was then irrigated with normal saline solution.  The joint capsule was closed with interrupted #1 Ethibond suture followed by #1 Vicryl to close the tensor fascia.  0 Vicryl is used to close the deep tissue and 2-0 Vicryl used to close subcutaneous tissue.  The skin was closed with interrupted 2-0 nylon.  Well-padded sterile dressing was applied.  The patient was taken off the Hana table and taken to recovery room in stable addition with all final counts being correct and no complications noted.  Benita Stabile, PA-C did assisted in the entire case from beginning and and his assistance was medically necessary and needed for soft tissue retraction and management, helping guide implant placement and a layered pressure of the wound.

## 2022-12-28 NOTE — Evaluation (Signed)
Physical Therapy Evaluation Patient Details Name: Alisha Martinez MRN: [id number] DOB: 10/16/1956 Today's Date: 12/28/2022  History of Present Illness  Pt is a 67 y.o. F who presents 12/28/2022 s/p R THA. Significant PMH: MS, DM, fibromyalgia.  Clinical Impression  Pt s/p right THA. Pt presents with RLE weakness, impaired standing balance, pain, and gait abnormalities. Pt pleasant and agreeable to participate in physical therapy evaluation, despite 10/10 pain. Pt able to participate in AAROM exercises for RLE and transfer to chair with min assist. Further gait limited due to + nausea/vomiting and increase in pain. Will continue to follow acutely to progress mobility as tolerated.       Recommendations for follow up therapy are one component of a multi-disciplinary discharge planning process, led by the attending physician.  Recommendations may be updated based on patient status, additional functional criteria and insurance authorization.  Follow Up Recommendations Follow physician's recommendations for discharge plan and follow up therapies (pt preference for SNF) Can patient physically be transported by private vehicle: Yes    Assistance Recommended at Discharge PRN  Patient can return home with the following  A little help with walking and/or transfers;A little help with bathing/dressing/bathroom;Assistance with cooking/housework;Assist for transportation;Help with stairs or ramp for entrance    Equipment Recommendations BSC/3in1  Recommendations for Other Services       Functional Status Assessment Patient has had a recent decline in their functional status and demonstrates the ability to make significant improvements in function in a reasonable and predictable amount of time.     Precautions / Restrictions Precautions Precautions: Fall Restrictions Weight Bearing Restrictions: No      Mobility  Bed Mobility Overal bed mobility: Needs Assistance Bed Mobility: Supine to Sit      Supine to sit: Min assist     General bed mobility comments: Assist for LLE managment. Increased time/effort    Transfers Overall transfer level: Needs assistance Equipment used: Rolling walker (2 wheels) Transfers: Sit to/from Stand, Bed to chair/wheelchair/BSC Sit to Stand: Min assist Stand pivot transfers: Min assist         General transfer comment: MinA to stand from EOB and pivot towards left to chair. Cues for sequencing/technique    Ambulation/Gait                  Stairs            Wheelchair Mobility    Modified Rankin (Stroke Patients Only)       Balance Overall balance assessment: Needs assistance Sitting-balance support: Feet supported Sitting balance-Leahy Scale: Good     Standing balance support: Bilateral upper extremity supported Standing balance-Leahy Scale: Poor Standing balance comment: reliant on BUE support                             Pertinent Vitals/Pain Pain Assessment Pain Assessment: 0-10 Pain Score: 10-Worst pain ever Pain Location: R hip/ RLE Pain Descriptors / Indicators: Discomfort, Grimacing, Guarding Pain Intervention(s): Limited activity within patient's tolerance, Monitored during session, Premedicated before session, Ice applied    Home Living Family/patient expects to be discharged to:: Private residence Living Arrangements: Children (daughter, grandson) Available Help at Discharge: Family Type of Home: Apartment Home Access: Stairs to enter Entrance Stairs-Rails: Left Entrance Stairs-Number of Steps: 17   Home Layout: One level Home Equipment: Conservation officer, nature (2 wheels);Cane - single point      Prior Function Prior Level of Function : Independent/Modified Independent  Mobility Comments: using RW ADLs Comments: assist with LB dressing     Hand Dominance        Extremity/Trunk Assessment   Upper Extremity Assessment Upper Extremity Assessment: Overall WFL for tasks  assessed    Lower Extremity Assessment Lower Extremity Assessment: RLE deficits/detail RLE Deficits / Details: s/p THA. Grossly 2-/5       Communication   Communication: No difficulties  Cognition Arousal/Alertness: Awake/alert Behavior During Therapy: WFL for tasks assessed/performed Overall Cognitive Status: Within Functional Limits for tasks assessed                                          General Comments      Exercises Total Joint Exercises Ankle Circles/Pumps: Both, 20 reps, Supine Quad Sets: Both, 15 reps, Supine Short Arc Quad: AAROM, Right, 10 reps, Supine Heel Slides: AAROM, Right, 5 reps, Supine Hip ABduction/ADduction: AAROM, Right, 10 reps, Supine   Assessment/Plan    PT Assessment Patient needs continued PT services  PT Problem List Decreased strength;Decreased activity tolerance;Decreased balance;Decreased mobility;Pain       PT Treatment Interventions DME instruction;Gait training;Stair training;Functional mobility training;Therapeutic activities;Therapeutic exercise;Balance training;Patient/family education    PT Goals (Current goals can be found in the Care Plan section)  Acute Rehab PT Goals Patient Stated Goal: wants to go to rehab PT Goal Formulation: With patient Time For Goal Achievement: 01/11/23 Potential to Achieve Goals: Good    Frequency 7X/week     Co-evaluation               AM-PAC PT "6 Clicks" Mobility  Outcome Measure Help needed turning from your back to your side while in a flat bed without using bedrails?: A Little Help needed moving from lying on your back to sitting on the side of a flat bed without using bedrails?: A Little Help needed moving to and from a bed to a chair (including a wheelchair)?: A Little Help needed standing up from a chair using your arms (e.g., wheelchair or bedside chair)?: A Little Help needed to walk in hospital room?: A Lot Help needed climbing 3-5 steps with a railing? :  Total 6 Click Score: 15    End of Session Equipment Utilized During Treatment: Gait belt Activity Tolerance: Patient limited by pain;Other (comment) (nausea) Patient left: in chair;with call bell/phone within reach;with chair alarm set;with family/visitor present Nurse Communication: Mobility status PT Visit Diagnosis: Unsteadiness on feet (R26.81);Difficulty in walking, not elsewhere classified (R26.2);Pain Pain - Right/Left: Right Pain - part of body: Hip    Time: 4562-5638 PT Time Calculation (min) (ACUTE ONLY): 35 min   Charges:   PT Evaluation $PT Eval Low Complexity: 1 Low PT Treatments $Therapeutic Exercise: 8-22 mins        Wyona Almas, PT, DPT Acute Rehabilitation Services Office [phone]   Deno Etienne 12/28/2022, 4:22 PM

## 2022-12-28 NOTE — Anesthesia Postprocedure Evaluation (Signed)
Anesthesia Post Note  Patient: Zarria Towell  Procedure(s) Performed: RIGHT TOTAL HIP ARTHROPLASTY ANTERIOR APPROACH (Right: Hip)     Patient location during evaluation: PACU Anesthesia Type: Spinal Level of consciousness: oriented and awake and alert Pain management: pain level controlled Vital Signs Assessment: post-procedure vital signs reviewed and stable Respiratory status: spontaneous breathing, respiratory function stable and patient connected to nasal cannula oxygen Cardiovascular status: blood pressure returned to baseline and stable Postop Assessment: no headache, no backache and no apparent nausea or vomiting Anesthetic complications: no   No notable events documented.  Last Vitals:  Vitals:   12/28/22 1345 12/28/22 1430  BP: 121/74 128/86  Pulse: 81 96  Resp: 17 16  Temp:    SpO2: 99% 100%    Last Pain:  Vitals:   12/28/22 1345  TempSrc:   PainSc: [place] Jarius Dieudonne

## 2022-12-28 NOTE — Anesthesia Procedure Notes (Addendum)
Spinal  Patient location during procedure: OR Start time: 12/28/2022 9:05 AM End time: 12/28/2022 9:10 AM Reason for block: surgical anesthesia Staffing Performed: anesthesiologist  Anesthesiologist: Effie Berkshire, MD Performed by: Effie Berkshire, MD Authorized by: Effie Berkshire, MD   Preanesthetic Checklist Completed: patient identified, IV checked, site marked, risks and benefits discussed, surgical consent, monitors and equipment checked, pre-op evaluation and timeout performed Spinal Block Patient position: sitting Prep: DuraPrep and site prepped and draped Location: L3-4 Injection technique: single-shot Needle Needle type: Pencan  Needle gauge: 24 G Needle length: 10 cm Needle insertion depth: 10 cm Additional Notes Patient tolerated well. No immediate complications.  Functioning IV was confirmed and monitors were applied. Sterile prep and drape, including hand hygiene and sterile gloves were used. The patient was positioned and the back was prepped. The skin was anesthetized with lidocaine. Free flow of clear CSF was obtained prior to injecting local anesthetic into the CSF after 2 attempts. The spinal needle aspirated freely following injection. The needle was carefully withdrawn. The patient tolerated the procedure well.

## 2022-12-28 NOTE — Interval H&P Note (Signed)
History and Physical Interval Note: The patient is aware that she is here today for a right total hip replacement to treat her severe right hip primary osteoarthritis.  There has been no acute or interval change in her medical status.  See recent H&P.  The risks and benefits of surgery been discussed in detail and informed consent is obtained.  The right operative hip has been marked.  12/28/2022 8:42 AM  Harrell Gave  has presented today for surgery, with the diagnosis of OSTEOARTHRITIS / [place].  The various methods of treatment have been discussed with the patient and family. After consideration of risks, benefits and other options for treatment, the patient has consented to  Procedure(s): RIGHT TOTAL HIP ARTHROPLASTY ANTERIOR APPROACH (Right) as a surgical intervention.  The patient's history has been reviewed, patient examined, no change in status, stable for surgery.  I have reviewed the patient's chart and labs.  Questions were answered to the patient's satisfaction.     Mcarthur Rossetti

## 2022-12-28 NOTE — Anesthesia Procedure Notes (Signed)
Procedure Name: MAC Date/Time: 12/28/2022 9:38 AM  Performed by: Valda Favia, CRNAPre-anesthesia Checklist: Patient identified, Emergency Drugs available, Suction available, Patient being monitored and Timeout performed Patient Re-evaluated:Patient Re-evaluated prior to induction Oxygen Delivery Method: Simple face mask Preoxygenation: Pre-oxygenation with 100% oxygen Induction Type: IV induction Placement Confirmation: positive ETCO2 Dental Injury: Teeth and Oropharynx as per pre-operative assessment

## 2022-12-28 NOTE — Transfer of Care (Signed)
Immediate Anesthesia Transfer of Care Note  Patient: Alisha Martinez  Procedure(s) Performed: RIGHT TOTAL HIP ARTHROPLASTY ANTERIOR APPROACH (Right: Hip)  Patient Location: PACU  Anesthesia Type:MAC and Spinal  Level of Consciousness: drowsy  Airway & Oxygen Therapy: Patient Spontanous Breathing and Patient connected to face mask oxygen  Post-op Assessment: Report given to RN and Post -op Vital signs reviewed and stable  Post vital signs: Reviewed and stable  Last Vitals:  Vitals Value Taken Time  BP 93/63 12/28/22 1056  Temp 37.1 C 12/28/22 1050  Pulse 102 12/28/22 1057  Resp 13 12/28/22 1057  SpO2 100 % 12/28/22 1057  Vitals shown include unvalidated device data.  Last Pain:  Vitals:   12/28/22 1050  TempSrc:   PainSc: Asleep         Complications: No notable events documented.

## 2022-12-29 LAB — BASIC METABOLIC PANEL
Anion gap: 14 (ref 5–15)
BUN: 19 mg/dL (ref 8–23)
CO2: 24 mmol/L (ref 22–32)
Calcium: 8.5 mg/dL — ABNORMAL LOW (ref 8.9–10.3)
Chloride: 98 mmol/L (ref 98–111)
Creatinine, Ser: 0.92 mg/dL (ref 0.44–1.00)
GFR, Estimated: >60 mL/min (ref >60)
Glucose, Bld: 125 mg/dL — ABNORMAL HIGH (ref 70–99)
Potassium: 3.7 mmol/L (ref 3.5–5.1)
Sodium: 136 mmol/L (ref 135–145)

## 2022-12-29 LAB — CBC
HCT: 35.7 % — ABNORMAL LOW (ref 36.0–46.0)
Hemoglobin: 11.9 g/dL — ABNORMAL LOW (ref 12.0–15.0)
MCH: 29.5 pg (ref 26.0–34.0)
MCHC: 33.3 g/dL (ref 30.0–36.0)
MCV: 88.4 fL (ref 80.0–100.0)
Platelets: 231 10*3/uL (ref 150–400)
RBC: 4.04 MIL/uL (ref 3.87–5.11)
RDW: 14.2 % (ref 11.5–15.5)
WBC: 10.7 10*3/uL — ABNORMAL HIGH (ref 4.0–10.5)
nRBC: 0 % (ref 0.0–0.2)

## 2022-12-29 MED ORDER — METOPROLOL TARTRATE 25 MG PO TABS
25.0000 mg | ORAL_TABLET | Freq: Two times a day (BID) | ORAL | Status: DC
  Administered 2022-12-29 (×2): 25 mg via ORAL
  Filled 2022-12-29 (×2): qty 1

## 2022-12-29 MED ORDER — INTERFERON BETA-1A 44 MCG/0.5ML ~~LOC~~ SOSY: 44.0000 ug | PREFILLED_SYRINGE | SUBCUTANEOUS | Status: DC

## 2022-12-29 MED ORDER — POTASSIUM CHLORIDE CRYS ER 20 MEQ PO TBCR
20.0000 meq | EXTENDED_RELEASE_TABLET | Freq: Two times a day (BID) | ORAL | Status: DC
  Administered 2022-12-29 – 2022-12-30 (×3): 20 meq via ORAL
  Filled 2022-12-29 (×3): qty 1

## 2022-12-29 MED ORDER — METOPROLOL TARTRATE 5 MG/5ML IV SOLN
10.0000 mg | Freq: Once | INTRAVENOUS | Status: AC
  Administered 2022-12-29: 10 mg via INTRAVENOUS
  Filled 2022-12-29: qty 10

## 2022-12-29 MED ORDER — ALPRAZOLAM 0.5 MG PO TABS
0.5000 mg | ORAL_TABLET | Freq: Two times a day (BID) | ORAL | Status: DC | PRN
  Administered 2022-12-29 – 2022-12-30 (×2): 0.5 mg via ORAL
  Filled 2022-12-29 (×2): qty 1

## 2022-12-29 MED ORDER — SODIUM CHLORIDE 0.9 % IV BOLUS
500.0000 mL | Freq: Once | INTRAVENOUS | Status: AC
  Administered 2022-12-29: 500 mL via INTRAVENOUS

## 2022-12-29 NOTE — Progress Notes (Signed)
Physical Therapy Treatment Patient Details Name: Alisha Martinez MRN: [id number] DOB: 1956/01/26 Today's Date: 12/29/2022   History of Present Illness Pt is a 67 y.o. F who presents 12/28/2022 s/p R THA. Noted tachycardia with exertion 1/3. Significant PMH: MS, DM, fibromyalgia.    PT Comments    Pt received in supine, lethargic but agreeable to bed-level exercises for RLE to reinforce HEP (handout in room), pt and daughter receptive. Pt needing maxA for supine to long sit transfer using bed side rails, pt HR elevated (up to 136 bpm) intermittently with bed-level activity, RN/MD aware. Pt SpO2 WFL on RA and HR improved down to 80's-90's bpm once bed placed in chair posture. Pt given ice pack for R hip for pain/edema relief, SCDs on as pt not tolerating TED hose at this time. Pt continues to benefit from PT services to progress toward functional mobility goals.   Recommendations for follow up therapy are one component of a multi-disciplinary discharge planning process, led by the attending physician.  Recommendations may be updated based on patient status, additional functional criteria and insurance authorization.  Follow Up Recommendations  Follow physician's recommendations for discharge plan and follow up therapies (pt preference for SNF) Can patient physically be transported by private vehicle: Yes   Assistance Recommended at Discharge PRN  Patient can return home with the following A little help with walking and/or transfers;A little help with bathing/dressing/bathroom;Assistance with cooking/housework;Assist for transportation;Help with stairs or ramp for entrance   Equipment Recommendations  BSC/3in1    Recommendations for Other Services       Precautions / Restrictions Precautions Precautions: Fall Precaution Comments: monitor HR/O2 Restrictions Weight Bearing Restrictions: No     Mobility  Bed Mobility Overal bed mobility: Needs Assistance Bed Mobility: Supine to Sit      Supine to sit: HOB elevated, Max assist     General bed mobility comments: supine to long sitting in bed with pt pulling up on bilateral side rails, pillow placed for improved lower back support and bed elevated to chair posture to promote improved pulmonary clearance/alertness. Pt encouraged to sit up in chair posture at least 30 mins to build tolerance for activity/sitting in following sessions.    Transfers Overall transfer level: Needs assistance Equipment used: Rolling walker (2 wheels) Transfers: Sit to/from Stand, Bed to chair/wheelchair/BSC Sit to Stand: Mod assist, From elevated surface           General transfer comment: defer, pt fatigued and tachy with minimal mobility in supine, wanting to rest    Ambulation/Gait Ambulation/Gait assistance: Mod assist Gait Distance (Feet): 12 Feet ([facility]f [facility] ~161fat bedside) Assistive device: Rolling walker (2 wheels) Gait Pattern/deviations: Step-to pattern, Decreased step length - right, Shuffle, Antalgic, Trunk flexed, Decreased weight shift to right Gait velocity: grossly <0.2 m/s     General Gait Details: pt with difficulty managing RW, holding it outside BOS unless given max cues and physical assist to manage it while stepping. pt with very minimal/small steps on RLE, able to improve slightly with dense cues. Pt dyspneic and c/o significant fatigue, so pulse oximeter placed and reading max HR 180 bpm and SpO2 87-89% on RA while [facility], MD/RN called to notify. Pt HR decreased to 118 bpm after sitting ~3 mins and SpO2 94% resting on RA.   Stairs             Wheelchair Mobility    Modified Rankin (Stroke Patients Only)  Balance Overall balance assessment: Needs assistance Sitting-balance support: Feet supported Sitting balance-Leahy Scale: Poor Sitting balance - Comments: pt has difficulty with unsupported sitting due to guarding with pain in long sit,  needs bed side rails and external assist at trunk   Standing balance support: Bilateral upper extremity supported Standing balance-Leahy Scale: Poor Standing balance comment: defer for safety                            Cognition Arousal/Alertness: Lethargic, Suspect due to medications Behavior During Therapy: Flat affect, Impulsive Overall Cognitive Status: Impaired/Different from baseline Area of Impairment: Following commands, Safety/judgement, Problem solving, Attention                   Current Attention Level: Focused   Following Commands: Follows one step commands with increased time, Follows multi-step commands inconsistently Safety/Judgement: Decreased awareness of safety   Problem Solving: Slow processing, Difficulty sequencing, Requires verbal cues, Decreased initiation General Comments: Pt remains drowsy, per daughter she is not at her cognitive baseline still. Pt intermittently tachy per monitor with bed-level repositioning and LE ROM, RN aware. Per her daughter, she has not yet taken her MS medication (Rebif) today and normally takes it MWF, so this was reported to RN/MD to follow up on.        Exercises Total Joint Exercises Ankle Circles/Pumps: Both, 20 reps, Supine Quad Sets: Both, Supine, 10 reps (tactile cues needed) Short Arc Quad: AROM, AAROM, Both, 10 reps, Supine (unable on RLE unless max multimodal cues given) Heel Slides: AAROM, Supine, 10 reps, AROM, Both (pt did better on RLE when alternating AROM on LLE and A/AAROM on RLE) Hip ABduction/ADduction: AAROM, Right, 10 reps, Supine Straight Leg Raises: PROM (a few reps supine while repositioning, pt with poor tolerance) Knee Flexion: AROM, Both, 10 reps, Seated (bed in chiar posture)    General Comments General comments (skin integrity, edema, etc.): HR to 136 bpm with repositioning in supine      Pertinent Vitals/Pain Pain Assessment Pain Assessment: Faces Faces Pain Scale: Hurts  little more Pain Location: R hip/ RLE Pain Descriptors / Indicators: Discomfort, Grimacing, Guarding, Sharp Pain Intervention(s): Monitored during session, Limited activity within patient's tolerance, Premedicated before session, Repositioned, Ice applied     PT Goals (current goals can now be found in the care plan section) Acute Rehab PT Goals Patient Stated Goal: wants to go to rehab PT Goal Formulation: With patient Time For Goal Achievement: 01/11/23 Progress towards PT goals: Progressing toward goals    Frequency    7X/week      PT Plan Current plan remains appropriate       AM-PAC PT "6 Clicks" Mobility   Outcome Measure  Help needed turning from your back to your side while in a flat bed without using bedrails?: A Lot Help needed moving from lying on your back to sitting on the side of a flat bed without using bedrails?: A Lot Help needed moving to and from a bed to a chair (including a wheelchair)?: A Lot Help needed standing up from a chair using your arms (e.g., wheelchair or bedside chair)?: A Lot Help needed to walk in hospital room?: Total Help needed climbing 3-5 steps with a railing? : Total 6 Click Score: 10    End of Session   Activity Tolerance: Patient limited by lethargy;Patient limited by fatigue (tachy intermittently in supine) Patient left: with call bell/phone within reach;with family/visitor present;Other (comment);in bed;with  bed alarm set;with SCD's reapplied (daughter present in room) Nurse Communication: Mobility status;Precautions;Other (comment) (tachycardia, lethargy; family asking about her MS medication (Rebif) that she takes on M/W/F) PT Visit Diagnosis: Unsteadiness on feet (R26.81);Difficulty in walking, not elsewhere classified (R26.2);Pain Pain - Right/Left: Right Pain - part of body: Hip     Time: 1610-9604 PT Time Calculation (min) (ACUTE ONLY): 28 min  Charges:  $Therapeutic Exercise: 8-22 mins $Therapeutic Activity: 8-22  mins                     Jakob Kimberlin P., PTA Acute Rehabilitation Services Secure Chat Preferred 9a-5:30pm Office: [place] 12/29/2022, 5:45 PM

## 2022-12-29 NOTE — Discharge Instructions (Signed)

## 2022-12-29 NOTE — NC FL2 (Signed)
[place] LEVEL OF CARE FORM     IDENTIFICATION  Patient Name: Alisha Martinez Birthdate: 1956/01/08 Sex: female Admission Date (Current Location): 12/28/2022  [facility] and [place] Number:  Herbalist and Address:  [facility] [place] [address] [place], [place] [address]      Provider Number: [id number]  Attending Physician Name and Address:  Mcarthur Rossetti,*  Relative Name and Phone Number:  Darron Doom Daughter   [phone]    Current Level of Care: Hospital Recommended Level of Care: [place] Prior Approval Number:    Date Approved/Denied:   PASRR Number: [id number]  Discharge Plan: SNF    Current Diagnoses: Patient Active Problem List   Diagnosis Date Noted   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   Other fatigue 11/22/2022   Unilateral primary osteoarthritis, right hip 11/08/2022   Multiple sclerosis ([place]) 10/26/2021   Pain in both lower extremities 10/26/2021   Urinary dysfunction 10/26/2021   Numbness 10/26/2021    Orientation RESPIRATION BLADDER Height & Weight     Self, Time, Situation, Place  Normal Continent Weight: 214 lb (97.1 kg) Height:  '5\' 4"'$  (162.6 cm)  BEHAVIORAL SYMPTOMS/MOOD NEUROLOGICAL BOWEL NUTRITION STATUS      Continent Diet (see discharge summary)  AMBULATORY STATUS COMMUNICATION OF NEEDS Skin   Limited Assist Verbally Surgical wounds                       Personal Care Assistance Level of Assistance  Bathing, Feeding, Dressing Bathing Assistance: Limited assistance Feeding assistance: Independent Dressing Assistance: Limited assistance     Functional Limitations Info  Sight, Hearing, Speech Sight Info: Adequate Hearing Info: Adequate Speech Info: Adequate    SPECIAL CARE FACTORS FREQUENCY  PT (By licensed PT), OT (By licensed OT)     PT Frequency: 5x week OT Frequency: 5x week            Contractures Contractures Info: Not  present    Additional Factors Info  Code Status, Allergies Code Status Info: full Allergies Info: Compazine (Prochlorperazine), Lisinopril           Current Medications (12/29/2022):  This is the current hospital active medication list Current Facility-Administered Medications  Medication Dose Route Frequency Provider Last Rate Last Admin   0.9 %  sodium chloride infusion   Intravenous Continuous Mcarthur Rossetti, MD   Stopped at 12/28/22 2206   acetaminophen (TYLENOL) tablet 325-650 mg  325-650 mg Oral Q6H PRN Mcarthur Rossetti, MD       alum & mag hydroxide-simeth (MAALOX/MYLANTA) 200-200-20 MG/5ML suspension 30 mL  30 mL Oral Q4H PRN Mcarthur Rossetti, MD       aspirin chewable tablet 81 mg  81 mg Oral BID Mcarthur Rossetti, MD   81 mg at 12/29/22 0835   atorvastatin (LIPITOR) tablet 80 mg  80 mg Oral Daily Mcarthur Rossetti, MD   80 mg at 12/29/22 0835   baclofen (LIORESAL) tablet 10 mg  10 mg Oral TID Mcarthur Rossetti, MD   10 mg at 12/29/22 0835   chlorthalidone (HYGROTON) tablet 25 mg  25 mg Oral Daily Mcarthur Rossetti, MD   25 mg at 12/29/22 0835   diphenhydrAMINE (BENADRYL) 12.5 MG/5ML elixir 12.5-25 mg  12.5-25 mg Oral Q4H PRN Mcarthur Rossetti, MD       docusate sodium (COLACE) capsule 100 mg  100 mg Oral BID Mcarthur Rossetti, MD  100 mg at 12/29/22 0835   empagliflozin (JARDIANCE) tablet 25 mg  25 mg Oral Daily Mcarthur Rossetti, MD   25 mg at 12/29/22 4709   HYDROmorphone (DILAUDID) injection 0.5-1 mg  0.5-1 mg Intravenous Q4H PRN Mcarthur Rossetti, MD       menthol-cetylpyridinium (CEPACOL) lozenge 3 mg  1 lozenge Oral PRN Mcarthur Rossetti, MD       Or   phenol (CHLORASEPTIC) mouth spray 1 spray  1 spray Mouth/Throat PRN Mcarthur Rossetti, MD       metFORMIN (GLUCOPHAGE) tablet 1,000 mg  1,000 mg Oral BID WC Mcarthur Rossetti, MD   1,000 mg at 12/29/22 0801   metoCLOPramide (REGLAN)  tablet 5-10 mg  5-10 mg Oral Q8H PRN Mcarthur Rossetti, MD       Or   metoCLOPramide (REGLAN) injection 5-10 mg  5-10 mg Intravenous Q8H PRN Mcarthur Rossetti, MD       mirabegron ER [facility]) tablet 50 mg  50 mg Oral Daily Mcarthur Rossetti, MD   50 mg at 12/29/22 0836   ondansetron (ZOFRAN) tablet 4 mg  4 mg Oral Q6H PRN Mcarthur Rossetti, MD       Or   ondansetron [facility]) injection 4 mg  4 mg Intravenous Q6H PRN Mcarthur Rossetti, MD   4 mg at 12/28/22 1533   oxybutynin (DITROPAN-XL) 24 hr tablet 15 mg  15 mg Oral QHS Mcarthur Rossetti, MD   15 mg at 12/28/22 2204   oxyCODONE (Oxy IR/ROXICODONE) immediate release tablet 10-15 mg  10-15 mg Oral Q4H PRN Mcarthur Rossetti, MD   10 mg at 12/28/22 1533   oxyCODONE (Oxy IR/ROXICODONE) immediate release tablet 5-10 mg  5-10 mg Oral Q4H PRN Mcarthur Rossetti, MD   10 mg at 12/28/22 2209   pantoprazole (PROTONIX) EC tablet 40 mg  40 mg Oral Daily Mcarthur Rossetti, MD   40 mg at 12/29/22 0835   polyethylene glycol (MIRALAX / GLYCOLAX) packet 17 g  17 g Oral Daily PRN Mcarthur Rossetti, MD       potassium chloride SA (KLOR-CON M) CR tablet 20 mEq  20 mEq Oral BID Mcarthur Rossetti, MD   20 mEq at 12/29/22 0835   pregabalin (LYRICA) capsule 100 mg  100 mg Oral TID Mcarthur Rossetti, MD   100 mg at 12/29/22 6283     Discharge Medications: Please see discharge summary for a list of discharge medications.  Relevant Imaging Results:  Relevant Lab Results:   Additional Information SSN: [id number], pt is vaccinated for covid but not boosted.  Joanne Chars, LCSW

## 2022-12-29 NOTE — Progress Notes (Addendum)
Physical Therapy Treatment Patient Details Name: Alisha Martinez MRN: [id number] DOB: March 03, 1956 Today's Date: 12/29/2022   History of Present Illness Pt is a 67 y.o. F who presents 12/28/2022 s/p R THA. Noted tachycardia with exertion 1/3. Significant PMH: MS, DM, fibromyalgia.    PT Comments    Pt received in supine, lethargic (possibly due to medication) and reporting TED hose were too uncomfortable, not agreeable to re-don, agreeable to participate in supine LE HEP instruction/assist and transfers with heavy encouragement. Pt needing dense multimodal cues and increased assist this date (up to [place]) due to lethargy/mild confusion and pain. Pt performed transfers and short gait task at bedside, HR/SpO2 monitored and pt noted to be tachy via portable sensor, new sensor obtained for monitor and confirms elevated HR but SpO2 reading WFL at rest. MD/RN notified of pt elevated HR and lethargy/mild confusion. Pt daughters x2 present in room with pt and pt agreeable to sit up in recliner for lunch prior to return to bed now that HR improving with rest. Pt continues to benefit from PT services to progress toward functional mobility goals.   Recommendations for follow up therapy are one component of a multi-disciplinary discharge planning process, led by the attending physician.  Recommendations may be updated based on patient status, additional functional criteria and insurance authorization.  Follow Up Recommendations  Follow physician's recommendations for discharge plan and follow up therapies (pt preference for SNF) Can patient physically be transported by private vehicle: Yes   Assistance Recommended at Discharge PRN  Patient can return home with the following A little help with walking and/or transfers;A little help with bathing/dressing/bathroom;Assistance with cooking/housework;Assist for transportation;Help with stairs or ramp for entrance   Equipment Recommendations  BSC/3in1    Recommendations  for Other Services       Precautions / Restrictions Precautions Precautions: Fall Precaution Comments: monitor HR/O2 Restrictions Weight Bearing Restrictions: No     Mobility  Bed Mobility Overal bed mobility: Needs Assistance Bed Mobility: Supine to Sit     Supine to sit: Mod assist, HOB elevated     General bed mobility comments: Assist for LLE managment. Increased time/effort, pt ultimately needed bed pad and trunk assist modA to achieve upright seated posture at EOB. Pt given gait belt to use as leg lifter but had difficulty managing this.    Transfers Overall transfer level: Needs assistance Equipment used: Rolling walker (2 wheels) Transfers: Sit to/from Stand, Bed to chair/wheelchair/BSC Sit to Stand: Mod assist, From elevated surface           General transfer comment: mod cues and modA for STS from elevated bed>RW and RW<>BSC and chair, pt ignoring cues for RLE placement and UE placement at times.    Ambulation/Gait Ambulation/Gait assistance: Mod assist Gait Distance (Feet): 12 Feet (60f [facility] ~140[facility] bedside) Assistive device: Rolling walker (2 wheels) Gait Pattern/deviations: Step-to pattern, Decreased step length - right, Shuffle, Antalgic, Trunk flexed, Decreased weight shift to right Gait velocity: grossly <0.2 m/s     General Gait Details: pt with difficulty managing RW, holding it outside BOS unless given max cues and physical assist to manage it while stepping. pt with very minimal/small steps on RLE, able to improve slightly with dense cues. Pt dyspneic and c/o significant fatigue, so pulse oximeter placed and reading max HR 180 bpm and SpO2 87-89% on RA while pt taking [facility], MD/RN called to notify. Pt HR decreased to 118 bpm after sitting ~3  mins and SpO2 94% resting on RA.   Stairs             Wheelchair Mobility    Modified Rankin (Stroke Patients Only)       Balance Overall balance  assessment: Needs assistance Sitting-balance support: Feet supported Sitting balance-Leahy Scale: Fair Sitting balance - Comments: pt has difficulty with unsupported sitting due to guarding with pain while seated EOB. mild LOB while reaching arms off bed as PTA assisting her to don gait belt but pt self-correcting with one hand on mattress   Standing balance support: Bilateral upper extremity supported Standing balance-Leahy Scale: Poor Standing balance comment: reliant on BUE support and external support                            Cognition Arousal/Alertness: Lethargic, Suspect due to medications Behavior During Therapy: Flat affect, Impulsive Overall Cognitive Status: Impaired/Different from baseline Area of Impairment: Following commands, Safety/judgement, Problem solving, Attention                   Current Attention Level: Focused   Following Commands: Follows one step commands with increased time, Follows multi-step commands inconsistently Safety/Judgement: Decreased awareness of safety   Problem Solving: Slow processing, Difficulty sequencing, Requires verbal cues General Comments: pt notably drowsy, when attempting to report to PTA events of AM (getting up to [facility] with RN assist), pt with repetitive statements and pausing halfway through sentences, unable to complete thought process. Pt daughters x2 present in room and encouraging her to continue. Pt with decreased insight into need to mobilize and need to maintain BLE compression stockings, refusing to allow PTA to re-don them prior to OOB mobility. RN notified. MD entered room during session and PTA notified him of pt lethargy/possible medication related slow processing.        Exercises Total Joint Exercises Ankle Circles/Pumps: Both, 20 reps, Supine Quad Sets: Both, Supine, 10 reps (tactile cues needed) Heel Slides: AAROM, Right, Supine, 10 reps Hip ABduction/ADduction: AAROM, Right, 10 reps,  Supine Straight Leg Raises: PROM (a few reps supine while repositioning, pt with poor tolerance)    General Comments General comments (skin integrity, edema, etc.): R hip dressing C/D/I; tachycardia, see gait comments, MD/RN notified and entered into flowsheet      Pertinent Vitals/Pain Pain Assessment Pain Assessment: Faces Faces Pain Scale: Hurts little more Pain Location: R hip/ RLE Pain Descriptors / Indicators: Discomfort, Grimacing, Guarding, Sharp Pain Intervention(s): Limited activity within patient's tolerance, Monitored during session, Premedicated before session, Repositioned, Other (comment), Ice applied (pt lethargic)     PT Goals (current goals can now be found in the care plan section) Acute Rehab PT Goals Patient Stated Goal: wants to go to rehab PT Goal Formulation: With patient Time For Goal Achievement: 01/11/23 Progress towards PT goals: Progressing toward goals (tachycardia/lethargy limiting)    Frequency    7X/week      PT Plan Current plan remains appropriate       AM-PAC PT "6 Clicks" Mobility   Outcome Measure  Help needed turning from your back to your side while in a flat bed without using bedrails?: A Lot Help needed moving from lying on your back to sitting on the side of a flat bed without using bedrails?: A Lot Help needed moving to and from a bed to a chair (including a wheelchair)?: A Lot Help needed standing up from a chair using your arms (e.g., wheelchair or bedside  chair)?: A Lot Help needed to walk in hospital room?: Total Help needed climbing 3-5 steps with a railing? : Total 6 Click Score: 10    End of Session Equipment Utilized During Treatment: Gait belt Activity Tolerance: Other (comment);Patient limited by lethargy;Treatment limited secondary to medical complications (Comment) (tachycardia, possible medication related lethargy) Patient left: in chair;with call bell/phone within reach;with chair alarm set;with family/visitor  present;Other (comment) (yellow bone foam under lateral RLE to promote neutral hip/knee posture and assist to float R heel, pt/family notified of reasoning for this) Nurse Communication: Mobility status;Precautions;Other (comment) (tachycardia, lethargy; MD also called to notify of max HR) PT Visit Diagnosis: Unsteadiness on feet (R26.81);Difficulty in walking, not elsewhere classified (R26.2);Pain Pain - Right/Left: Right Pain - part of body: Hip     Time: 4128-7867 PT Time Calculation (min) (ACUTE ONLY): 35 min  Charges:  $Gait Training: 8-22 mins $Therapeutic Exercise: 8-22 mins                     Rohail Klees P., PTA Acute Rehabilitation Services Secure Chat Preferred 9a-5:30pm Office: [place] 12/29/2022, 12:20 PM

## 2022-12-29 NOTE — Plan of Care (Signed)

## 2022-12-29 NOTE — Progress Notes (Signed)
   12/29/22 1236  Assess: MEWS Score  Pulse Rate (!) 113  Assess: MEWS Score  MEWS Temp 0  MEWS Systolic 0  MEWS Pulse 2  MEWS RR 0  MEWS LOC 0  MEWS Score 2  MEWS Score Color Yellow  Treat  Pain Scale 0-10  Pain Score 6  Pain Location Hip  Pain Orientation Right  Pain Descriptors / Indicators Aching  Pain Frequency Intermittent  Pain Onset With Activity  Pain Intervention(s) Medication (See eMAR)  Take Vital Signs  Increase Vital Sign Frequency  Yellow: Q 2hr X 2 then Q 4hr X 2, if remains yellow, continue Q 4hrs  Escalate  MEWS: Escalate Yellow: discuss with charge nurse/RN and consider discussing with provider and RRT  Notify: Charge Nurse/RN  Name of Charge Nurse/RN Notified [place]  Date Charge Nurse/RN Notified 12/29/22  Time Charge Nurse/RN Notified 1153  Provider Notification  Provider Name/Title Dr. Ninfa Linden  Date Provider Notified 12/29/22  Time Provider Notified 1200  Method of Notification Page  Notification Reason Other (Comment)  Provider response See new orders  Date of Provider Response 12/29/22  Time of Provider Response 1240  Assess: SIRS CRITERIA  SIRS Temperature  0  SIRS Pulse 1  SIRS Respirations  0  SIRS WBC 0  SIRS Score Sum  1

## 2022-12-29 NOTE — Significant Event (Signed)
Rapid Response Event Note   Reason for Call :  tachycardia  Initial Focused Assessment:  Patient sitting up in chair talking with family, not in distress. Denies chest pain/flutter/SOB. States with activity she feels dizzy "like I'm hallucinating almost."  VS 124/90 (99) HR 164 apical RR 19 100% 3L [place]  Interventions:  EKG (already completed) MD notified  Metoprolol '10mg'$  IV  Metoprolol '25mg'$  tab BID start  Plan of Care:  Continue to monitor patient Call back for further needs  VS 103/83 (90) HR 133 RR 21 97% 3L [place]  Event Summary:  MD Notified: Kathrynn Speed Call Time: [place] Time: [place] Time: [place]  [place]man Nickels, RN

## 2022-12-29 NOTE — Progress Notes (Signed)
Subjective: 1 Day Post-Op Procedure(s) (LRB): RIGHT TOTAL HIP ARTHROPLASTY ANTERIOR APPROACH (Right) Patient reports pain as moderate.  Requesting short-term skilled nursing.   Objective: Vital signs in last 24 hours: Temp:  [97.9 F (36.6 C)-98.8 F (37.1 C)] 98.1 F (36.7 C) (01/03 0448) Pulse Rate:  [73-106] 100 (01/03 0448) Resp:  [13-20] 16 (01/02 1430) BP: (76-136)/(58-90) 132/90 (01/03 0448) SpO2:  [95 %-100 %] 100 % (01/03 0448) Weight:  [97.1 kg] 97.1 kg (01/02 0755)  Intake/Output from previous day: 01/02 0701 - 01/03 0700 In: 912.5 [P.O.:480; I.V.:332.5; IV Piggyback:100] Out: 1850 [Urine:1800; Blood:50] Intake/Output this shift: No intake/output data recorded.  Recent Labs    12/29/22 0306  HGB 11.9*   Recent Labs    12/29/22 0306  WBC 10.7*  RBC 4.04  HCT 35.7*  PLT 231   Recent Labs    12/28/22 0824 12/29/22 0306  NA 138 136  K 2.9* 3.7  CL 97* 98  CO2 29 24  BUN 18 19  CREATININE 1.00 0.92  GLUCOSE 125* 125*  CALCIUM 9.2 8.5*   No results for input(s): "LABPT", "INR" in the last 72 hours.  Sensation intact distally Intact pulses distally Dorsiflexion/Plantar flexion intact Incision: dressing C/D/I   Assessment/Plan: 1 Day Post-Op Procedure(s) (LRB): RIGHT TOTAL HIP ARTHROPLASTY ANTERIOR APPROACH (Right) Up with therapy Discharge to SNF once has been seen by Transitional Care Team and short-term skilled nursing is found.      Mcarthur Rossetti 12/29/2022, 7:30 AM

## 2022-12-29 NOTE — Plan of Care (Signed)
  Problem: Education: Goal: Knowledge of the prescribed therapeutic regimen will improve Outcome: Not Progressing Goal: Understanding of discharge needs will improve Outcome: Not Progressing Goal: Individualized Educational Video(s) Outcome: Not Progressing   Problem: Activity: Goal: Ability to avoid complications of mobility impairment will improve Outcome: Not Progressing Goal: Ability to tolerate increased activity will improve Outcome: Not Progressing   Problem: Clinical Measurements: Goal: Postoperative complications will be avoided or minimized Outcome: Not Progressing   Problem: Pain Management: Goal: Pain level will decrease with appropriate interventions Outcome: Not Progressing   Problem: Education: Goal: Knowledge of General Education information will improve Description: Including pain rating scale, medication(s)/side effects and non-pharmacologic comfort measures Outcome: Not Progressing   Problem: Health Behavior/Discharge Planning: Goal: Ability to manage health-related needs will improve Outcome: Not Progressing   Problem: Clinical Measurements: Goal: Ability to maintain clinical measurements within normal limits will improve Outcome: Not Progressing Goal: Will remain free from infection Outcome: Not Progressing Goal: Diagnostic test results will improve Outcome: Not Progressing Goal: Respiratory complications will improve Outcome: Not Progressing Goal: Cardiovascular complication will be avoided Outcome: Not Progressing   Problem: Activity: Goal: Risk for activity intolerance will decrease Outcome: Not Progressing   Problem: Nutrition: Goal: Adequate nutrition will be maintained Outcome: Not Progressing   Problem: Coping: Goal: Level of anxiety will decrease Outcome: Not Progressing

## 2022-12-29 NOTE — TOC Initial Note (Addendum)
Transition of Care [facility]) - Initial/Assessment Note    Patient Details  Name: Alisha Martinez MRN: [id number] Date of Birth: 1956/05/04  Transition of Care [facility]) CM/SW Contact:    Joanne Chars, LCSW Phone Number: 12/29/2022, 12:32 PM  Clinical Narrative:      CSW met with pt and daughters [name] and Butch Penny regarding DC recommendation for SNF.  Permission given to speak with daughters present.  They are all agreeable to SNF, medicare choice document provided, permission given to send out referral in hub.  Pt lives with daughter Butch Penny, no current services.  Pt is vaccinated for covid but not boosted.              1500: Bed offers provided to pt daughter.  She is asking for response from [facility] and [facility]. CSW reached out to those facilities.    Expected Discharge Plan: Skilled Nursing Facility Barriers to Discharge: SNF Pending bed offer   Patient Goals and CMS Choice Patient states their goals for this hospitalization and ongoing recovery are:: walk, exercise, and dance CMS Medicare.gov Compare Post Acute Care list provided to:: Patient Represenative (must comment) Choice offered to / list presented to : Adult Children (daughter Butch Penny)      Expected Discharge Plan and Services In-house Referral: Clinical Social Work   Post Acute Care Choice: [place] Living arrangements for the past 2 months: [place]                                      Prior Living Arrangements/Services Living arrangements for the past 2 months: Single Family Home Lives with:: Adult Children (with daughter Butch Penny) Patient language and need for interpreter reviewed:: Yes Do you feel safe going back to the place where you live?: Yes      Need for Family Participation in Patient Care: Yes (Comment) Care giver support system in place?: Yes (comment) Current home services: Other (comment) (none) Criminal Activity/Legal Involvement Pertinent to Current  Situation/Hospitalization: No - Comment as needed  Activities of Daily Living      Permission Sought/Granted Permission sought to share information with : Family Supports Permission granted to share information with : Yes, Verbal Permission Granted  Share Information with NAME: daughters Butch Penny and [name]  Permission granted to share info w AGENCY: SNF        Emotional Assessment Appearance:: Appears stated age Attitude/Demeanor/Rapport: Engaged Affect (typically observed): Pleasant Orientation: : Oriented to Self, Oriented to Place, Oriented to  Time, Oriented to Situation      Admission diagnosis:  Status post total replacement of right hip [Z96.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] Patient Active Problem List   Diagnosis Date Noted   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 [place] [address] Phone: [phone] Fax: [phone]  Social Determinants of Health (SDOH) Social History: SDOH Screenings   Tobacco Use: Low Risk  (12/28/2022)   SDOH Interventions:     Readmission Risk Interventions     No data to display

## 2022-12-30 DIAGNOSIS — Z79899 Other long term (current) drug therapy: Secondary | ICD-10-CM | POA: Diagnosis not present

## 2022-12-30 DIAGNOSIS — N319 Neuromuscular dysfunction of bladder, unspecified: Secondary | ICD-10-CM | POA: Diagnosis present

## 2022-12-30 DIAGNOSIS — E876 Hypokalemia: Secondary | ICD-10-CM | POA: Diagnosis not present

## 2022-12-30 DIAGNOSIS — E119 Type 2 diabetes mellitus without complications: Secondary | ICD-10-CM | POA: Diagnosis present

## 2022-12-30 DIAGNOSIS — Z7984 Long term (current) use of oral hypoglycemic drugs: Secondary | ICD-10-CM | POA: Diagnosis not present

## 2022-12-30 DIAGNOSIS — G35 Multiple sclerosis: Secondary | ICD-10-CM | POA: Diagnosis present

## 2022-12-30 DIAGNOSIS — J45909 Unspecified asthma, uncomplicated: Secondary | ICD-10-CM | POA: Diagnosis present

## 2022-12-30 DIAGNOSIS — M1611 Unilateral primary osteoarthritis, right hip: Secondary | ICD-10-CM | POA: Diagnosis present

## 2022-12-30 DIAGNOSIS — F419 Anxiety disorder, unspecified: Secondary | ICD-10-CM | POA: Diagnosis present

## 2022-12-30 DIAGNOSIS — I10 Essential (primary) hypertension: Secondary | ICD-10-CM | POA: Diagnosis present

## 2022-12-30 DIAGNOSIS — Z6836 Body mass index (BMI) 36.0-36.9, adult: Secondary | ICD-10-CM | POA: Diagnosis not present

## 2022-12-30 DIAGNOSIS — E785 Hyperlipidemia, unspecified: Secondary | ICD-10-CM | POA: Diagnosis present

## 2022-12-30 DIAGNOSIS — R008 Other abnormalities of heart beat: Secondary | ICD-10-CM | POA: Diagnosis not present

## 2022-12-30 DIAGNOSIS — M797 Fibromyalgia: Secondary | ICD-10-CM | POA: Diagnosis present

## 2022-12-30 DIAGNOSIS — Z9181 History of falling: Secondary | ICD-10-CM | POA: Diagnosis not present

## 2022-12-30 DIAGNOSIS — Z7982 Long term (current) use of aspirin: Secondary | ICD-10-CM | POA: Diagnosis not present

## 2022-12-30 DIAGNOSIS — Z888 Allergy status to other drugs, medicaments and biological substances status: Secondary | ICD-10-CM | POA: Diagnosis not present

## 2022-12-30 DIAGNOSIS — T783XXA Angioneurotic edema, initial encounter: Secondary | ICD-10-CM | POA: Diagnosis not present

## 2022-12-30 DIAGNOSIS — I700 Atherosclerosis of aorta: Secondary | ICD-10-CM | POA: Diagnosis present

## 2022-12-30 DIAGNOSIS — Z9049 Acquired absence of other specified parts of digestive tract: Secondary | ICD-10-CM | POA: Diagnosis not present

## 2022-12-30 DIAGNOSIS — Z96641 Presence of right artificial hip joint: Secondary | ICD-10-CM | POA: Diagnosis present

## 2022-12-30 DIAGNOSIS — I471 Supraventricular tachycardia, unspecified: Secondary | ICD-10-CM | POA: Diagnosis not present

## 2022-12-30 DIAGNOSIS — G4733 Obstructive sleep apnea (adult) (pediatric): Secondary | ICD-10-CM | POA: Diagnosis present

## 2022-12-30 LAB — CBC
HCT: 35.5 % — ABNORMAL LOW (ref 36.0–46.0)
Hemoglobin: 11.5 g/dL — ABNORMAL LOW (ref 12.0–15.0)
MCH: 29.3 pg (ref 26.0–34.0)
MCHC: 32.4 g/dL (ref 30.0–36.0)
MCV: 90.3 fL (ref 80.0–100.0)
Platelets: 208 10*3/uL (ref 150–400)
RBC: 3.93 MIL/uL (ref 3.87–5.11)
RDW: 14.4 % (ref 11.5–15.5)
WBC: 10.7 10*3/uL — ABNORMAL HIGH (ref 4.0–10.5)
nRBC: 0 % (ref 0.0–0.2)

## 2022-12-30 LAB — BASIC METABOLIC PANEL
Anion gap: 10 (ref 5–15)
BUN: 13 mg/dL (ref 8–23)
CO2: 28 mmol/L (ref 22–32)
Calcium: 8.4 mg/dL — ABNORMAL LOW (ref 8.9–10.3)
Chloride: 94 mmol/L — ABNORMAL LOW (ref 98–111)
Creatinine, Ser: 0.81 mg/dL (ref 0.44–1.00)
GFR, Estimated: >60 mL/min (ref >60)
Glucose, Bld: 112 mg/dL — ABNORMAL HIGH (ref 70–99)
Potassium: 3 mmol/L — ABNORMAL LOW (ref 3.5–5.1)
Sodium: 132 mmol/L — ABNORMAL LOW (ref 135–145)

## 2022-12-30 LAB — GLUCOSE, CAPILLARY: Glucose-Capillary: 142 mg/dL — ABNORMAL HIGH (ref 70–99)

## 2022-12-30 MED ORDER — OXYCODONE HCL 5 MG PO TABS: 5.0000 mg | ORAL_TABLET | ORAL | 0 refills | Status: DC | PRN

## 2022-12-30 MED ORDER — INTERFERON BETA-1A 44 MCG/0.5ML ~~LOC~~ SOSY: 44.0000 ug | PREFILLED_SYRINGE | SUBCUTANEOUS | Status: DC

## 2022-12-30 MED ORDER — INTERFERON BETA-1A 44 MCG/0.5ML ~~LOC~~ SOSY
44.0000 ug | PREFILLED_SYRINGE | Freq: Once | SUBCUTANEOUS | Status: DC
  Filled 2022-12-30: qty 0.5

## 2022-12-30 MED ORDER — POTASSIUM CHLORIDE CRYS ER 20 MEQ PO TBCR
40.0000 meq | EXTENDED_RELEASE_TABLET | Freq: Two times a day (BID) | ORAL | Status: DC
  Administered 2022-12-30 – 2023-01-04 (×10): 40 meq via ORAL
  Filled 2022-12-30 (×10): qty 2

## 2022-12-30 MED ORDER — ASPIRIN 81 MG PO CHEW: 81.0000 mg | CHEWABLE_TABLET | Freq: Two times a day (BID) | ORAL | 0 refills | Status: DC

## 2022-12-30 MED ORDER — INTERFERON BETA-1A 44 MCG/0.5ML ~~LOC~~ SOSY
44.0000 ug | PREFILLED_SYRINGE | Freq: Once | SUBCUTANEOUS | Status: AC
  Administered 2022-12-30: 44 ug via SUBCUTANEOUS
  Filled 2022-12-30: qty 0.5

## 2022-12-30 NOTE — Progress Notes (Signed)
Patient ID: Alisha Martinez, female   DOB: [birth year], 67 y.o.   MRN: [id number] The patient will definitely be here until tomorrow as she continues to slowly mobilize with therapy following her hip replacement.  We have also needed to watch her significant tachycardia and her potassium levels that continue to fluctuate.  She is being treated for hypokalemia and we are addressing her fluctuating heart rate.

## 2022-12-30 NOTE — Progress Notes (Signed)
Subjective: 2 Days Post-Op Procedure(s) (LRB): RIGHT TOTAL HIP ARTHROPLASTY ANTERIOR APPROACH (Right) Patient reports pain as moderate.  Still working to get her MS medications.  She can bring these from home.  She will need this for skilled nursing as well.  Her heart rate still fluctuates but it is regular.  When I just was examining her her heart rate was in the low 90s.  Then when I laid her flat and turned to her side remove her dressings and it did cause some pain heart rate it went up into the 140s.  Her blood pressure is stable.  She denies any shortness of breath or chest pain.  There is a component of this that is anxiety related as well.  Her H&H is stable as well as her potassium.  Objective: Vital signs in last 24 hours: Temp:  [98.4 F (36.9 C)-99.3 F (37.4 C)] 98.4 F (36.9 C) (01/03 2044) Pulse Rate:  [86-164] 86 (01/03 2044) Resp:  [10-27] 16 (01/03 2044) BP: (99-124)/(67-90) 99/72 (01/03 2044) SpO2:  [96 %-100 %] 96 % (01/03 2044)  Intake/Output from previous day: 01/03 0701 - 01/04 0700 In: 480 [P.O.:480] Out: 400 [Urine:400] Intake/Output this shift: No intake/output data recorded.  Recent Labs    12/29/22 0306 12/30/22 0334  HGB 11.9* 11.5*   Recent Labs    12/29/22 0306 12/30/22 0334  WBC 10.7* 10.7*  RBC 4.04 3.93  HCT 35.7* 35.5*  PLT 231 208   Recent Labs    12/29/22 0306 12/30/22 0334  NA 136 132*  K 3.7 3.0*  CL 98 94*  CO2 24 28  BUN 19 13  CREATININE 0.92 0.81  GLUCOSE 125* 112*  CALCIUM 8.5* 8.4*   No results for input(s): "LABPT", "INR" in the last 72 hours.  Sensation intact distally Intact pulses distally Dorsiflexion/Plantar flexion intact Incision: dressing C/D/I   Assessment/Plan: 2 Days Post-Op Procedure(s) (LRB): RIGHT TOTAL HIP ARTHROPLASTY ANTERIOR APPROACH (Right) Up with therapy Plan for discharge tomorrow Discharge to SNF      Mcarthur Rossetti 12/30/2022, 12:04 PM

## 2022-12-30 NOTE — TOC Progression Note (Signed)
Transition of Care [facility]) - Progression Note    Patient Details  Name: Alisha Martinez MRN: [id number] Date of Birth: Apr 30, 1956  [facility]) CM/SW Contact  Joanne Chars, LCSW Phone Number: 12/30/2022, 2:46 PM  Clinical Narrative:   CSW spoke with daughter [name] several times regarding bed offers.  [facility] does offer, but has covid cases and family does not want to admit there.  CSW was asked to find out if [facility](no), [facility](yes-1) and [facility](no) had any covid cases. This info passed on to [place].  She and her sister want to visit SNF before deciding and will let CSW know in AM.    Expected Discharge Plan: Skilled Nursing Facility Barriers to Discharge: SNF Pending bed offer  Expected Discharge Plan and Services In-house Referral: Clinical Social Work   Post Acute Care Choice: [place] Living arrangements for the past 2 months: Single Family Home                                       Social Determinants of Health (SDOH) Interventions SDOH Screenings   Tobacco Use: Low Risk  (12/28/2022)    Readmission Risk Interventions     No data to display

## 2022-12-30 NOTE — Progress Notes (Signed)
Physical Therapy Treatment Patient Details Name: Alisha Martinez MRN: [id number] DOB: September 30, 1956 Today's Date: 12/30/2022   History of Present Illness Pt is a 67 y.o. F who presents 12/28/2022 s/p R THA. Noted tachycardia with exertion during PT sessions 1/3 and 1/4. Significant PMH: MS, DM, fibromyalgia.    PT Comments    Pt received in supine, lethargic but agreeable to therapy session, pt daughter [name] present and encouraging, she reports pt had PO pain meds ~2 hours prior. Pt with slightly improved initiation/command following today compared with previous session and moving with slightly less physical assist, pt needing up to [facility] for transfers/bed mobility this date. Pt pulling 200-350 on Incentive Spirometer, encouraged her to continue hourly along with LE HEP TID. Pt bed placed in chair posture and defer OOB to recliner given pt lethargy and tachycardia. Pt continues to benefit from PT services to progress toward functional mobility goals.   Recommendations for follow up therapy are one component of a multi-disciplinary discharge planning process, led by the attending physician.  Recommendations may be updated based on patient status, additional functional criteria and insurance authorization.  Follow Up Recommendations  Follow physician's recommendations for discharge plan and follow up therapies (pt preference for SNF) Can patient physically be transported by private vehicle:  (if VS more stable (tachy))   Assistance Recommended at Discharge PRN  Patient can return home with the following A little help with walking and/or transfers;A little help with bathing/dressing/bathroom;Assistance with cooking/housework;Assist for transportation;Help with stairs or ramp for entrance   Equipment Recommendations  BSC/3in1    Recommendations for Other Services       Precautions / Restrictions Precautions Precautions: Fall Precaution Comments: monitor HR/O2 Restrictions Weight Bearing  Restrictions: No     Mobility  Bed Mobility Overal bed mobility: Needs Assistance Bed Mobility: Supine to Sit, Sit to Supine     Supine to sit: HOB elevated, Mod assist Sit to supine: Min assist, HOB elevated   General bed mobility comments: increased time to initiate/perform, pt using bed rails and also needing modA with therapist arm as bar to pull up on to raise trunk but moving BLE and hips well toward EOB unassisted. RLE assist for return to supine along with bed features.    Transfers Overall transfer level: Needs assistance Equipment used: Rolling walker (2 wheels) Transfers: Sit to/from Stand, Bed to chair/wheelchair/BSC Sit to Stand: Min assist, Mod assist   Step pivot transfers: Min assist       General transfer comment: EOB<>RW x2 reps, cues for UE placement and at times needs hand over hand guidance for safe sequencing due to lethargy/processing delay. minA to rise but at times modA steadying; dense cues for sequencing sidesteps along EOB. HR tachy to 156 bpm with each standing trial so defer longer distances or OOB to chair for pt safety given continued lethargy and tachycardia.    Ambulation/Gait                   Stairs             Wheelchair Mobility    Modified Rankin (Stroke Patients Only)       Balance Overall balance assessment: Needs assistance Sitting-balance support: Feet supported Sitting balance-Leahy Scale: Poor Sitting balance - Comments: pt using UE likely due to pain; min guard at times due to lethargy   Standing balance support: Bilateral upper extremity supported, Reliant on assistive device for balance Standing balance-Leahy Scale: Poor Standing balance comment: min guard static standing, minA  dynamic standing                            Cognition Arousal/Alertness: Lethargic, Suspect due to medications Behavior During Therapy: Flat affect Overall Cognitive Status: Impaired/Different from baseline Area of  Impairment: Following commands, Safety/judgement, Problem solving, Attention                   Current Attention Level: Focused   Following Commands: Follows one step commands with increased time, Follows multi-step commands inconsistently Safety/Judgement: Decreased awareness of safety   Problem Solving: Slow processing, Difficulty sequencing, Requires verbal cues, Decreased initiation General Comments: Pt remains drowsy, had PO pain meds 2 hours prior to session. Per her daughter, she has not yet taken her MS medication (Rebif) since Mon and normally takes it MWF, they notified RN/MD 1/3. Pt with slightly improved command following today.        Exercises Total Joint Exercises Ankle Circles/Pumps: Both, 20 reps, Supine Quad Sets: Both, Supine, 10 reps (tactile cues needed) Short Arc Quad: AROM, AAROM, Both, 10 reps, Supine (unable on RLE unless max multimodal cues given) Heel Slides: AAROM, Supine, 10 reps, AROM, Both (pt did better on RLE when alternating AROM on LLE and A/AAROM on RLE) Hip ABduction/ADduction: AAROM, 10 reps, Supine, Both, AROM (AROM on LLE AAROM on RLE) Straight Leg Raises: AAROM, Right, 5 reps, Supine Long Arc Quad: AROM, AAROM, Both, 10 reps, Seated (occasional AA on RLE due to lethargy) Marching in Standing: AROM, Both, 5 reps, Standing    General Comments General comments (skin integrity, edema, etc.): HR 102 bpm resting, up to 156 bpm with exertional tasks (standing), SpO2 WFL on RA throughout.      Pertinent Vitals/Pain Pain Assessment Pain Assessment: Faces Faces Pain Scale: Hurts little more Pain Location: R hip/ RLE Pain Descriptors / Indicators: Discomfort, Grimacing, Guarding, Sharp Pain Intervention(s): Limited activity within patient's tolerance, Monitored during session, Premedicated before session, Repositioned, Ice applied     PT Goals (current goals can now be found in the care plan section) Acute Rehab PT Goals Patient Stated Goal:  wants to go to rehab PT Goal Formulation: With patient Time For Goal Achievement: 01/11/23 Progress towards PT goals: Progressing toward goals    Frequency    7X/week      PT Plan Current plan remains appropriate       AM-PAC PT "6 Clicks" Mobility   Outcome Measure  Help needed turning from your back to your side while in a flat bed without using bedrails?: A Lot Help needed moving from lying on your back to sitting on the side of a flat bed without using bedrails?: A Lot Help needed moving to and from a bed to a chair (including a wheelchair)?: A Lot Help needed standing up from a chair using your arms (e.g., wheelchair or bedside chair)?: A Lot Help needed to walk in hospital room?: Total Help needed climbing 3-5 steps with a railing? : Total 6 Click Score: 10    End of Session Equipment Utilized During Treatment: Gait belt Activity Tolerance: Patient limited by lethargy;Patient tolerated treatment well;Other (comment);Treatment limited secondary to medical complications (Comment) (tachycardia to high 150's bpm with sitting/standing, improves with rest) Patient left: with call bell/phone within reach;with family/visitor present;Other (comment);in bed;with bed alarm set;with SCD's reapplied (daughter present in room, bed in full upright chair posture) Nurse Communication: Mobility status;Precautions;Other (comment) (tachycardia, lethargy) PT Visit Diagnosis: Unsteadiness on feet (R26.81);Difficulty in walking,  not elsewhere classified (R26.2);Pain Pain - Right/Left: Right Pain - part of body: Hip     Time: 8469-6295 PT Time Calculation (min) (ACUTE ONLY): 35 min  Charges:  $Therapeutic Exercise: 8-22 mins $Therapeutic Activity: 8-22 mins                     Carrye Goller P., PTA Acute Rehabilitation Services Secure Chat Preferred 9a-5:30pm Office: [place] 12/30/2022, 11:47 AM

## 2022-12-31 ENCOUNTER — Inpatient Hospital Stay (HOSPITAL_COMMUNITY): Payer: Medicare PPO

## 2022-12-31 ENCOUNTER — Telehealth: Payer: Self-pay | Admitting: Orthopaedic Surgery

## 2022-12-31 DIAGNOSIS — G4733 Obstructive sleep apnea (adult) (pediatric): Secondary | ICD-10-CM | POA: Diagnosis present

## 2022-12-31 DIAGNOSIS — R008 Other abnormalities of heart beat: Secondary | ICD-10-CM

## 2022-12-31 DIAGNOSIS — E785 Hyperlipidemia, unspecified: Secondary | ICD-10-CM | POA: Diagnosis present

## 2022-12-31 DIAGNOSIS — M1611 Unilateral primary osteoarthritis, right hip: Secondary | ICD-10-CM | POA: Diagnosis not present

## 2022-12-31 DIAGNOSIS — R000 Tachycardia, unspecified: Secondary | ICD-10-CM | POA: Diagnosis not present

## 2022-12-31 DIAGNOSIS — I10 Essential (primary) hypertension: Secondary | ICD-10-CM | POA: Diagnosis present

## 2022-12-31 DIAGNOSIS — E119 Type 2 diabetes mellitus without complications: Secondary | ICD-10-CM

## 2022-12-31 DIAGNOSIS — J45909 Unspecified asthma, uncomplicated: Secondary | ICD-10-CM | POA: Insufficient documentation

## 2022-12-31 LAB — CBC WITH DIFFERENTIAL/PLATELET
Abs Immature Granulocytes: 0.02 10*3/uL (ref 0.00–0.07)
Basophils Absolute: 0 10*3/uL (ref 0.0–0.1)
Basophils Relative: 0 %
Eosinophils Absolute: 0.1 10*3/uL (ref 0.0–0.5)
Eosinophils Relative: 1 %
HCT: 38 % (ref 36.0–46.0)
Hemoglobin: 12.8 g/dL (ref 12.0–15.0)
Immature Granulocytes: 0 %
Lymphocytes Relative: 22 %
Lymphs Abs: 2 10*3/uL (ref 0.7–4.0)
MCH: 29.5 pg (ref 26.0–34.0)
MCHC: 33.7 g/dL (ref 30.0–36.0)
MCV: 87.6 fL (ref 80.0–100.0)
Monocytes Absolute: 1 10*3/uL (ref 0.1–1.0)
Monocytes Relative: 11 %
Neutro Abs: 6 10*3/uL (ref 1.7–7.7)
Neutrophils Relative %: 66 %
Platelets: 211 10*3/uL (ref 150–400)
RBC: 4.34 MIL/uL (ref 3.87–5.11)
RDW: 14.1 % (ref 11.5–15.5)
WBC: 9.1 10*3/uL (ref 4.0–10.5)
nRBC: 0 % (ref 0.0–0.2)

## 2022-12-31 LAB — COMPREHENSIVE METABOLIC PANEL
ALT: 23 U/L (ref 0–44)
AST: 37 U/L (ref 15–41)
Albumin: 2.9 g/dL — ABNORMAL LOW (ref 3.5–5.0)
Alkaline Phosphatase: 55 U/L (ref 38–126)
Anion gap: 19 — ABNORMAL HIGH (ref 5–15)
BUN: 16 mg/dL (ref 8–23)
CO2: 23 mmol/L (ref 22–32)
Calcium: 8.8 mg/dL — ABNORMAL LOW (ref 8.9–10.3)
Chloride: 97 mmol/L — ABNORMAL LOW (ref 98–111)
Creatinine, Ser: 0.88 mg/dL (ref 0.44–1.00)
GFR, Estimated: >60 mL/min (ref >60)
Glucose, Bld: 98 mg/dL (ref 70–99)
Potassium: 3.7 mmol/L (ref 3.5–5.1)
Sodium: 139 mmol/L (ref 135–145)
Total Bilirubin: 0.6 mg/dL (ref 0.3–1.2)
Total Protein: 7.1 g/dL (ref 6.5–8.1)

## 2022-12-31 LAB — ECHOCARDIOGRAM COMPLETE
Area-P 1/2: 10.25 cm2
Calc EF: 37 %
Height: 64 in
S' Lateral: 3.6 cm
Single Plane A2C EF: 40.7 %
Single Plane A4C EF: 32.7 %
Weight: 3424 oz

## 2022-12-31 LAB — GLUCOSE, CAPILLARY
Glucose-Capillary: 124 mg/dL — ABNORMAL HIGH (ref 70–99)
Glucose-Capillary: 136 mg/dL — ABNORMAL HIGH (ref 70–99)
Glucose-Capillary: 128 mg/dL — ABNORMAL HIGH (ref 70–99)

## 2022-12-31 LAB — LACTIC ACID, PLASMA: Lactic Acid, Venous: 2.2 mmol/L (ref 0.5–1.9)

## 2022-12-31 MED ORDER — PERFLUTREN LIPID MICROSPHERE
1.0000 mL | INTRAVENOUS | Status: AC | PRN
  Administered 2022-12-31: 2 mL via INTRAVENOUS

## 2022-12-31 MED ORDER — HYDROCODONE-ACETAMINOPHEN 5-325 MG PO TABS
1.0000 | ORAL_TABLET | Freq: Four times a day (QID) | ORAL | Status: DC | PRN
  Administered 2023-01-01 – 2023-01-04 (×5): 1 via ORAL
  Filled 2022-12-31: qty 2
  Filled 2022-12-31 (×4): qty 1

## 2022-12-31 MED ORDER — METOPROLOL TARTRATE 25 MG PO TABS
25.0000 mg | ORAL_TABLET | Freq: Two times a day (BID) | ORAL | Status: DC
  Administered 2022-12-31 – 2023-01-01 (×2): 25 mg via ORAL
  Filled 2022-12-31 (×3): qty 1

## 2022-12-31 MED ORDER — INSULIN ASPART 100 UNIT/ML IJ SOLN
0.0000 [IU] | Freq: Three times a day (TID) | INTRAMUSCULAR | Status: DC
  Administered 2023-01-03: 2 [IU] via SUBCUTANEOUS
  Administered 2023-01-04: 3 [IU] via SUBCUTANEOUS
  Administered 2023-01-04: 2 [IU] via SUBCUTANEOUS

## 2022-12-31 MED ORDER — HYDROCODONE-ACETAMINOPHEN 5-325 MG PO TABS: 1.0000 | ORAL_TABLET | Freq: Four times a day (QID) | ORAL | 0 refills | Status: DC | PRN

## 2022-12-31 NOTE — Assessment & Plan Note (Signed)
-  She has been on both mirabegron and oxybutynin - should not be on both so will stop one and continue the other

## 2022-12-31 NOTE — Progress Notes (Signed)
2D echo attempted, but patient needs to use bedside. Notified tech.

## 2022-12-31 NOTE — Assessment & Plan Note (Signed)
-  Stop chlorthalidone -Prior angiogedema with ACE so no ACE/ARB -Start lopressor 25 mg PO BID

## 2022-12-31 NOTE — Assessment & Plan Note (Signed)
-  Recent A1c was 7.2, reasonable control -hold Glucophage, Jardiance -Cover with moderate-scale SSI

## 2022-12-31 NOTE — Telephone Encounter (Signed)
I put a letter in there, do I just call the patient to let her know I have daughters note?

## 2022-12-31 NOTE — Assessment & Plan Note (Signed)
-  She is on interferon and followed by neurology as an outpatient

## 2022-12-31 NOTE — Progress Notes (Addendum)
  Echocardiogram 2D Echocardiogram has been performed. Results communicated to Cardiology.   Bobbye Charleston 12/31/2022, 3:10 PM

## 2022-12-31 NOTE — Progress Notes (Signed)
Pt has YELLOW MEWS.  Pt is stable.  Pt is at basline per hospital stay.  Pt has episodes of sinus tach.  MD is aware. No new orders.

## 2022-12-31 NOTE — Assessment & Plan Note (Signed)
-  Has not worn CPAP in years -May be contributing to tachycardia -Will resume CPAP

## 2022-12-31 NOTE — Progress Notes (Signed)
Physical Therapy Treatment Patient Details Name: Alisha Martinez MRN: [id number] DOB: 08-30-1956 Today's Date: 12/31/2022   History of Present Illness Pt is a 67 y.o. F who presents 12/28/2022 s/p R THA. Noted tachycardia with exertion during PT sessions 1/3 and 1/4. Significant PMH: MS, DM, fibromyalgia.    PT Comments    Pt received in supine, agreeable to therapy session with encouragement, with emphasis on safety with transfers, RW use, RLE HEP instruction and importance of mobilizing OOB more now that HR/O2 appearing stable. HR max 116 bpm with transfers and pt quick to fatigue, able to perform gait with RW and minA ~1f at bedside prior to resting. Encouraged pt to get OOB BID on weekends and to mobilize at bedside with nursing staff/family assist needs +1 with RW for safety at all times.   Recommendations for follow up therapy are one component of a multi-disciplinary discharge planning process, led by the attending physician.  Recommendations may be updated based on patient status, additional functional criteria and insurance authorization.  Follow Up Recommendations  Follow physician's recommendations for discharge plan and follow up therapies (pt preference for SNF) Can patient physically be transported by private vehicle: Yes   Assistance Recommended at Discharge PRN  Patient can return home with the following A little help with walking and/or transfers;A little help with bathing/dressing/bathroom;Assistance with cooking/housework;Assist for transportation;Help with stairs or ramp for entrance   Equipment Recommendations  BSC/3in1    Recommendations for Other Services       Precautions / Restrictions Precautions Precautions: Fall Precaution Comments: monitor HR/O2 Restrictions Weight Bearing Restrictions: Yes RLE Weight Bearing: Weight bearing as tolerated     Mobility  Bed Mobility Overal bed mobility: Needs Assistance Bed Mobility: Supine to Sit     Supine to sit: HOB  elevated, Mod assist     General bed mobility comments: Increased time to initiate/perform, pt using bed rails and also needing modA for anterior scooting and min/modA for trunk elevation    Transfers Overall transfer level: Needs assistance Equipment used: Rolling walker (2 wheels) Transfers: Sit to/from Stand, Bed to chair/wheelchair/BSC Sit to Stand: Min assist   Step pivot transfers: Min assist, From elevated surface       General transfer comment: EOB>RW, then RW<>BSC and to recliner, up to minA for stability and lowering assist with stand>sit. Pt quick to fatigue, pivotal distance limited to ~321f then ~[id number]  Ambulation/Gait Ambulation/Gait assistance: Min assist Gait Distance (Feet): 6 Feet Assistive device: Rolling walker (2 wheels) Gait Pattern/deviations: Step-to pattern, Decreased step length - right, Shuffle, Antalgic, Trunk flexed       General Gait Details: improved RW management today, but quick to fatigue and c/o moderate pain unable to progress past 42ft8fstance after using BSC.   Stairs             Wheelchair Mobility    Modified Rankin (Stroke Patients Only)       Balance Overall balance assessment: Needs assistance Sitting-balance support: Feet supported Sitting balance-Leahy Scale: Poor Sitting balance - Comments: supervision for sitting with 1 UE support   Standing balance support: Bilateral upper extremity supported, Reliant on assistive device for balance Standing balance-Leahy Scale: Poor Standing balance comment: min guard static, minA dynamic balance with RW                            Cognition Arousal/Alertness: Awake/alert Behavior During Therapy: WFL for tasks assessed/performed Overall Cognitive Status: Within  Functional Limits for tasks assessed                         Following Commands: Follows one step commands consistently, Follows multi-step commands with increased time Safety/Judgement:  Decreased awareness of safety   Problem Solving: Requires verbal cues General Comments: Improved alertness, per MD she did not have stronger PO meds today and another med was also changed, MD clearance for session after Echo results in, pt with improved command following and safety awareness this evening.        Exercises Total Joint Exercises Ankle Circles/Pumps: Both, 20 reps, Supine Quad Sets:  (pt reports compliance) Heel Slides: Supine, 10 reps, AROM, Right Hip ABduction/ADduction: AAROM, 10 reps, Supine, Right Long Arc Quad: AROM, Seated, Right, 10 reps    General Comments General comments (skin integrity, edema, etc.): hip incision dressing C/D/I      Pertinent Vitals/Pain Pain Assessment Pain Assessment: Faces Faces Pain Scale: Hurts even more Pain Location: R hip/ RLE with movement; she reports minimal to no resting pain Pain Descriptors / Indicators: Discomfort, Grimacing, Guarding, Sharp Pain Intervention(s): Monitored during session, Repositioned, Patient requesting pain meds-RN notified     PT Goals (current goals can now be found in the care plan section) Acute Rehab PT Goals Patient Stated Goal: wants to go to rehab PT Goal Formulation: With patient Time For Goal Achievement: 01/11/23 Progress towards PT goals: Progressing toward goals    Frequency    7X/week      PT Plan Current plan remains appropriate       AM-PAC PT "6 Clicks" Mobility   Outcome Measure  Help needed turning from your back to your side while in a flat bed without using bedrails?: A Lot Help needed moving from lying on your back to sitting on the side of a flat bed without using bedrails?: A Lot Help needed moving to and from a bed to a chair (including a wheelchair)?: A Little Help needed standing up from a chair using your arms (e.g., wheelchair or bedside chair)?: A Little Help needed to walk in hospital room?: Total Help needed climbing 3-5 steps with a railing? : Total 6  Click Score: 12    End of Session Equipment Utilized During Treatment: Gait belt Activity Tolerance: Patient tolerated treatment well Patient left: with call bell/phone within reach;in chair;with chair alarm set Nurse Communication: Mobility status;Precautions;Other (comment) (pt may want ice pack for her hip) PT Visit Diagnosis: Unsteadiness on feet (R26.81);Difficulty in walking, not elsewhere classified (R26.2);Pain Pain - Right/Left: Right Pain - part of body: Hip     Time: 0947-0962 PT Time Calculation (min) (ACUTE ONLY): 22 min  Charges:  $Therapeutic Activity: 8-22 mins                     Jonte Wollam P., PTA Acute Rehabilitation Services Secure Chat Preferred 9a-5:30pm Office: [place] 12/31/2022, 6:05 PM

## 2022-12-31 NOTE — Consult Note (Addendum)
Initial Consultation Note   Patient: Alisha Martinez GBT:[id number] DOB: [birth year] PCP: Patient, No Pcp Per DOA: 12/28/2022 DOS: the patient was seen and examined on 12/31/2022 Primary service: Mcarthur Rossetti  Referring physician: Ninfa Linden Reason for consult: THR on 1/2.  Persistent episodes of paroxysmal tachycardia, unsure why.      Assessment and Plan: * Unilateral primary osteoarthritis, right hip -s/p R total hip replacement -Appears to have pain controlled so this is unlikely the cause of her tachycardia -Continue Ultram, Lyrica, Baclofen as needed -Also on Norco and Dilaudid for breakthrough for now per ortho  Sinus tachycardia -Baseline HR appears to be in the 90s (based on 2 recent neurology notes) -Has had persistent sinus tachycardia worse with exertion post-operatively -Most likely causes are generally pain (appears well controlled), anxiety (none obviously present), infection (mildly elevated lactate but also not likely), dehydration (possible mild dehydration present based on hyponatremia) -Echo ordered and showed: Akinesis of the anteroseptal/anterior/inferoseptal wall from base to apex. Left ventricular ejection fraction, by estimation, is 40 to 45%. The left ventricle has mildly decreased function. The left ventricle demonstrates global hypokinesis.  -This is changed from prior echo in 03/2022 and is concerning for acute ischemia. -Discussed with cardiology; given her proximity to surgery and no active chest pain, no intervention is planned and they will plan to see her in clinic in a few weeks. -Will resume daily ASA -Will stop chlorthalidone and start lopressor BID for now  OSA (obstructive sleep apnea) -Has not worn CPAP in years -May be contributing to tachycardia -Will resume CPAP  Dyslipidemia -Continue Lipitor 80 mg daily  Hypertension -Stop chlorthalidone -Prior angiogedema with ACE so no ACE/ARB -Start lopressor 25 mg PO BID   Diabetes mellitus  without complication (HCC) -Recent A1c was 7.2, reasonable control -hold Glucophage, Jardiance -Cover with moderate-scale SSI   Urinary dysfunction -She has been on both mirabegron and oxybutynin - should not be on both so will stop one and continue the other  Multiple sclerosis ([place]) -She is on interferon and followed by neurology as an outpatient      TRH will continue to follow the patient.  HPI: Alisha Martinez is a 67 y.o. female with past medical history of DM, HTN, MS, and OSA who presented on 1/2 for hip replacement.  She reports that it first started when the nurse gave her medication.  She got up and went to the bathroom and then went back to bed.  Her daughter reports that it started when PT came down the first time.  As she moved, her heart rate would go up.  It has been very up and down at night with sleep.  With any type of movement, her HR goes up.  For example, this AM she picked up her phone and her HR went from 106 to 160.  She does have a CPAP machine and has not been using it for several years or while here.  She does not feel any different when her heart is beating fast.  No chest pain or tightness at all.  No SOB.  She has been "heavily sedated" for 3 days and even so her HR was going wild.  No fever.  Prior doctor was Alisha Martinez, current PCP is Alisha Martinez at [facility].    Review of Systems: As mentioned in the history of present illness. All other systems reviewed and are negative. 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"   Diabetes mellitus without complication ([place])    Fibromyalgia    Hypertension    MS (multiple sclerosis) ([place])    Sleep apnea    Past Surgical History:  Procedure Laterality Date   APPENDECTOMY     EYE SURGERY Bilateral 2023   "repaired torn retina"   KNEE SURGERY     TOTAL HIP ARTHROPLASTY Right 12/28/2022   Procedure:  RIGHT TOTAL HIP ARTHROPLASTY ANTERIOR APPROACH;  Surgeon: Mcarthur Rossetti, MD;  Location: [facility];  Service: Orthopedics;  Laterality: Right;   VAGINA SURGERY     "pt states the doctor surgically 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 She has never used smokeless tobacco. She reports that she does not currently use alcohol. She reports that she does not use drugs.  Allergies  Allergen Reactions   Compazine [Prochlorperazine]     Muscle pain   Lisinopril Swelling    Family History  Problem Relation Age of Onset   Alopecia Mother    Cancer Mother    Cancer Father    Diabetes Father    Cancer Sister     Prior to Admission medications   Medication Sig Start Date End Date Taking? Authorizing Provider  aspirin 81 MG chewable tablet Chew 81 mg by mouth daily.   Yes [provider]  atorvastatin (LIPITOR) 80 MG tablet Take 80 mg by mouth daily.   Yes [provider]  baclofen (LIORESAL) 10 MG tablet Take 10 mg by mouth 3 (three) times daily.   Yes [provider]  chlorthalidone (HYGROTON) 25 MG tablet Take 25 mg by mouth daily.   Yes [provider]  diclofenac (VOLTAREN) 75 MG EC tablet Take 75 mg by mouth 2 (two) times daily. 04/26/22  Yes [provider]  ergocalciferol (VITAMIN D2) 1.25 MG (50000 UT) capsule Take 50,000 Units by mouth every Friday.   Yes [provider]  interferon beta-1a (REBIF) 44 MCG/0.5ML SOSY injection Inject 0.5 mLs (44 mcg total) into the skin every Monday, Wednesday, and Friday. 11/01/22  Yes Sater, Nanine Means, MD  JARDIANCE 25 MG TABS tablet Take 25 mg by mouth daily.   Yes [provider]  levocetirizine (XYZAL) 5 MG tablet Take 5 mg by mouth every evening.   Yes [provider]  metFORMIN (GLUCOPHAGE) 1000 MG tablet Take 1,000 mg by mouth 2 (two) times daily with a meal.   Yes [provider]  mirabegron ER (MYRBETRIQ)  50 MG TB24 tablet Take 1 tablet (50 mg total) by mouth daily. 05/17/22  Yes Sater, Nanine Means, MD  oxybutynin (DITROPAN XL) 15 MG 24 hr tablet Take 15 mg by mouth at bedtime.   Yes [provider]  pregabalin (LYRICA) 100 MG capsule Take 100 mg by mouth 3 (three) times daily.   Yes [provider]  traMADol (ULTRAM) 50 MG tablet Take 1-2 tablets (50-100 mg total) by mouth every 6 (six) hours as needed. 11/08/22  Yes Mcarthur Rossetti, MD  aspirin 81 MG chewable tablet Chew 1 tablet (81 mg total) by mouth 2 (two) times daily. 12/30/22   Mcarthur Rossetti, MD  oxyCODONE (OXY IR/ROXICODONE) 5 MG immediate release tablet Take 1-2 tablets (5-10 mg total) by mouth every 4 (four) hours as needed for moderate pain (pain score 4-6). 12/30/22   Mcarthur Rossetti, MD  potassium chloride SA (KLOR-CON  M) 20 MEQ tablet Take 1 tablet (20 mEq total) by mouth 2 (two) times daily. 12/22/22   Mcarthur Rossetti, MD    Physical Exam: Vitals:   12/30/22 1530 12/30/22 1926 12/31/22 0447 12/31/22 0846  BP: 115/80 104/69 115/70 (!) 122/96  Pulse: 97 99 (!) 108 (!) 108  Resp: 20 20 (!) 21 20  Temp: 98.4 F (36.9 C)   98 F (36.7 C)  TempSrc: Oral   Axillary  SpO2:  97% 98% 98%  Weight:      Height:       General:  Appears calm and comfortable and is in NAD Eyes:   EOMI, normal lids, iris ENT:  grossly normal hearing, lips & tongue, mmm Neck:  no LAD, masses or thyromegaly Cardiovascular:  RRR, no m/r/g. No LE edema.  Respiratory:   CTA bilaterally with no wheezes/rales/rhonchi.  Normal respiratory effort. Abdomen:  soft, NT, ND Skin:  no rash or induration seen on limited exam Musculoskeletal:  s/p R hip replacement, no obvious pain Lower extremity:  No LE edema.  Limited foot exam with no ulcerations.  2+ distal pulses. Psychiatric:  grossly normal mood and affect, speech fluent and appropriate, AOx3 Neurologic:  CN 2-12 grossly intact, moves all extremities in  coordinated fashion other than RLE   Radiological Exams on Admission: Independently reviewed - see discussion in A/P where applicable  ECHOCARDIOGRAM COMPLETE  Result Date: 12/31/2022    ECHOCARDIOGRAM REPORT   Patient Name:   YOMARA Loughridge Date of Exam: 12/31/2022 Medical Rec #:  [id number]   Height:       64.0 in Accession #:    [id number]  Weight:       214.0 lb Date of Birth:  November 29, 1956  BSA:          2.013 m Patient Age:    31 years    BP:           122/96 mmHg Patient Gender: F           HR:           102 bpm. Exam Location:  Inpatient Procedure: 2D Echo, Cardiac Doppler, Color Doppler and Intracardiac            Opacification Agent Indications:    R00.8 Other abnormalities of heart beat  History:        Patient has no prior history of Echocardiogram examinations.                 Abnormal ECG; Arrythmias:Tachycardia.  Sonographer:    Roseanna Rainbow RDCS Referring Phys: [id number] Emileo Semel  Sonographer Comments: Suboptimal parasternal window, suboptimal subcostal window, suboptimal apical window, Technically difficult study due to poor echo windows and patient is obese. Image acquisition challenging due to patient body habitus. Extremely difficult due to habitus. Waited 30 mins to get patient back in bed from toileting. IMPRESSIONS  1. Akinesis of the anteroseptal/anterior/inferoseptal wall from base to apex. Left ventricular ejection fraction, by estimation, is 40 to 45%. The left ventricle has mildly decreased function. The left ventricle demonstrates global hypokinesis. Left ventricular diastolic parameters are indeterminate.  2. Right ventricular systolic function is normal. The right ventricular size is normal. Tricuspid regurgitation signal is inadequate for assessing PA pressure.  3. No evidence of mitral valve regurgitation.  4. The aortic valve is grossly normal. Aortic valve regurgitation is not visualized.  5. The inferior vena cava is normal in size with greater than 50% respiratory variability,  suggesting right atrial pressure  of 3 mmHg. Comparison(s): No prior Echocardiogram. Conclusion(s)/Recommendation(s): Has wall motion abnormality, consider ischemic evaluation. FINDINGS  Left Ventricle: Akinesis of the anteroseptal/anterior/inferoseptal wall from base to apex. Left ventricular ejection fraction, by estimation, is 40 to 45%. The left ventricle has mildly decreased function. The left ventricle demonstrates global hypokinesis. Definity contrast agent was given IV to delineate the left ventricular endocardial borders. The left ventricular internal cavity size was normal in size. There is no left ventricular hypertrophy. Abnormal (paradoxical) septal motion, consistent with left bundle branch block. Left ventricular diastolic parameters are indeterminate. Right Ventricle: The right ventricular size is normal. Right ventricular systolic function is normal. Tricuspid regurgitation signal is inadequate for assessing PA pressure. Left Atrium: Left atrial size was normal in size. Right Atrium: Right atrial size was normal in size. Pericardium: There is no evidence of pericardial effusion. Mitral Valve: No evidence of mitral valve regurgitation. Tricuspid Valve: Tricuspid valve regurgitation is not demonstrated. Aortic Valve: The aortic valve is grossly normal. Aortic valve regurgitation is not visualized. Pulmonic Valve: Pulmonic valve regurgitation is not visualized. Aorta: The aortic root and ascending aorta are structurally normal, with no evidence of dilitation. Venous: The inferior vena cava is normal in size with greater than 50% respiratory variability, suggesting right atrial pressure of 3 mmHg. IAS/Shunts: The interatrial septum was not well visualized.  LEFT VENTRICLE PLAX 2D LVIDd:         4.30 cm      Diastology LVIDs:         3.60 cm      LV e' medial:    4.79 cm/s LV PW:         1.00 cm      LV E/e' medial:  12.5 LV IVS:        1.00 cm      LV e' lateral:   5.11 cm/s LVOT diam:     2.40 cm       LV E/e' lateral: 11.7 LV SV:         58 LV SV Index:   29 LVOT Area:     4.52 cm  LV Volumes (MOD) LV vol d, MOD A2C: 118.0 ml LV vol d, MOD A4C: 114.0 ml LV vol s, MOD A2C: 70.0 ml LV vol s, MOD A4C: 76.7 ml LV SV MOD A2C:     48.0 ml LV SV MOD A4C:     114.0 ml LV SV MOD BP:      43.1 ml RIGHT VENTRICLE RV S prime:     10.20 cm/s TAPSE (M-mode): 1.4 cm LEFT ATRIUM             Index        RIGHT ATRIUM          Index LA diam:        2.60 cm 1.29 cm/m   RA Area:     7.76 cm LA Vol (A2C):   36.2 ml 17.98 ml/m  RA Volume:   12.60 ml 6.26 ml/m LA Vol (A4C):   29.0 ml 14.40 ml/m LA Biplane Vol: 32.2 ml 15.99 ml/m  AORTIC VALVE LVOT Vmax:   92.70 cm/s LVOT Vmean:  58.950 cm/s LVOT VTI:    0.128 m  AORTA Ao Root diam: 3.30 cm Ao Asc diam:  3.20 cm MITRAL VALVE MV Area (PHT): 10.25 cm   SHUNTS MV Decel Time: 74 msec     Systemic VTI:  0.13 m MV E velocity: 59.70 cm/s  Systemic Diam: 2.40 cm MV  A velocity: 83.80 cm/s MV E/A ratio:  0.71 Phineas Inches Electronically signed by Phineas Inches Signature Date/Time: 12/31/2022/3:36:35 PM    Final     EKG: Independently reviewed.   0800 on 1/2 - NSR with rate 94; prolonged QTc 517; LBBB with no evidence of acute ischemia 1256 on 1/3 - Sinus tachycardia with rate 114; prolonged QTc 501; LBBB with no evidence of acute ischemia   Labs on Admission: I have personally reviewed the available labs and imaging studies at the time of the admission.  Pertinent labs:    Na++ 132 (repeat pending from today) K+ 3.0 (repeat pending from today) WBC 10.7 -> 9.1 Lactate 2.2   Family Communication: Daughter was present throughout evaluation Primary team communication: I spoke with Silvestre Gunner about the patient at the time of the consult.   Thank you very much for involving [place] in the care of your patient.  Author: Karmen Bongo, MD 12/31/2022 6:22 PM  For on call review [email or web address].

## 2022-12-31 NOTE — Progress Notes (Signed)
Received a critical lab results of lactic acid 2.2. Alisha Yates,MD made aware with no new order. Pt remains alert/oriented in no apparent distress.

## 2022-12-31 NOTE — Progress Notes (Signed)
Patient ID: Alisha Martinez, female   DOB: [birth year], 67 y.o.   MRN: [id number] The patient is resting comfortably in bed this morning.  Her heart rate is in the 150s and her blood pressure is normal.  She denies any shortness of breath and denies any chest pain.  She continues to have episodes of tachycardia.  There are times that her heart rate goes lower.  It is not a regular in terms of the rhythm.  Her right operative hip is stable.  At this point I will likely consult the hospitalist service for their input as to the tachycardia versus cardiology consult.

## 2022-12-31 NOTE — Assessment & Plan Note (Signed)
-  Continue Lipitor 80 mg daily.

## 2022-12-31 NOTE — TOC Progression Note (Signed)
Transition of Care [facility]) - Progression Note    Patient Details  Name: Alisha Martinez MRN: [id number] Date of Birth: 12-May-1956  Transition of [facility] CM/SW Contact  Joanne Chars, LCSW Phone Number: 12/31/2022, 1:12 PM  Clinical Narrative:   CSW spoke with pt daughter [name] regarding SNF choice.  She does want to accept offer at [facility], is aware that they have active covid cases there currently.  CSW confirmed with [facility].    Expected Discharge Plan: Skilled Nursing Facility Barriers to Discharge: SNF Pending bed offer  Expected Discharge Plan and Services In-house Referral: Clinical Social Work   Post Acute Care Choice: [place] Living arrangements for the past 2 months: Single Family Home                                       Social Determinants of Health (SDOH) Interventions SDOH Screenings   Tobacco Use: Low Risk  (12/28/2022)    Readmission Risk Interventions     No data to display

## 2022-12-31 NOTE — Progress Notes (Signed)
Patient ID: Alisha Martinez, female   DOB: 1956-02-28, 67 y.o.   MRN: [id number] The patient looks much better late this afternoon when I saw her this morning.  Although she did look good this morning she was still tachycardic.  Right now her heart rate is only in the upper 90s to 100.  She just had a 2D echo.  I appreciate Dr. Lorin Mercy taking a look at the patient from an internal medicine standpoint of things.  The patient is cognitively looking great and more with it today than how she looked before.  I have stopped the stronger pain medications as well and she has not had really any much today at all and I think that is help with her mental status.  She is very motivated.  Physical therapy is now at the bedside to work with her.  We will see how she looks over the next 24 hours to determine when she can be discharged to skilled nursing.  Her right hip is stable.

## 2022-12-31 NOTE — Assessment & Plan Note (Addendum)
-  s/p R total hip replacement -Appears to have pain controlled so this is unlikely the cause of her tachycardia -Continue Ultram, Lyrica, Baclofen as needed -Also on Norco and Dilaudid for breakthrough for now per ortho

## 2022-12-31 NOTE — Telephone Encounter (Signed)
Patients daughter Darron Doom is requesing oow note for 1/2, 1/3, and also for 01/8 as she is caring for patient. Please provide note for [employer] as they are completing forms. (Letter previously provided for pts sister)Thanks!

## 2022-12-31 NOTE — Assessment & Plan Note (Addendum)
-  Baseline HR appears to be in the 90s (based on 2 recent neurology notes) -Has had persistent sinus tachycardia worse with exertion post-operatively -Most likely causes are generally pain (appears well controlled), anxiety (none obviously present), infection (mildly elevated lactate but also not likely), dehydration (possible mild dehydration present based on hyponatremia) -Echo ordered and showed: Akinesis of the anteroseptal/anterior/inferoseptal wall from base to apex. Left ventricular ejection fraction, by estimation, is 40 to 45%. The left ventricle has mildly decreased function. The left ventricle demonstrates global hypokinesis.  -This is changed from prior echo in 03/2022 and is concerning for acute ischemia. -Discussed with cardiology; given her proximity to surgery and no active chest pain, no intervention is planned and they will plan to see her in clinic in a few weeks. -Will resume daily ASA -Will stop chlorthalidone and start lopressor BID for now

## 2022-12-31 NOTE — Plan of Care (Signed)
  Problem: Education: Goal: Knowledge of the prescribed therapeutic regimen will improve Outcome: Not Progressing Goal: Understanding of discharge needs will improve Outcome: Not Progressing Goal: Individualized Educational Video(s) Outcome: Not Progressing   Problem: Activity: Goal: Ability to avoid complications of mobility impairment will improve Outcome: Not Progressing Goal: Ability to tolerate increased activity will improve Outcome: Not Progressing   Problem: Clinical Measurements: Goal: Postoperative complications will be avoided or minimized Outcome: Not Progressing   Problem: Pain Management: Goal: Pain level will decrease with appropriate interventions Outcome: Not Progressing   Problem: Skin Integrity: Goal: Will show signs of wound healing Outcome: Not Progressing   Problem: Education: Goal: Knowledge of General Education information will improve Description: Including pain rating scale, medication(s)/side effects and non-pharmacologic comfort measures Outcome: Not Progressing   Problem: Health Behavior/Discharge Planning: Goal: Ability to manage health-related needs will improve Outcome: Not Progressing   Problem: Clinical Measurements: Goal: Ability to maintain clinical measurements within normal limits will improve Outcome: Not Progressing Goal: Will remain free from infection Outcome: Not Progressing Goal: Diagnostic test results will improve Outcome: Not Progressing Goal: Respiratory complications will improve Outcome: Not Progressing Goal: Cardiovascular complication will be avoided Outcome: Not Progressing   Problem: Activity: Goal: Risk for activity intolerance will decrease Outcome: Not Progressing   Problem: Nutrition: Goal: Adequate nutrition will be maintained Outcome: Not Progressing   Problem: Coping: Goal: Level of anxiety will decrease Outcome: Not Progressing   Problem: Elimination: Goal: Will not experience complications  related to bowel motility Outcome: Not Progressing Goal: Will not experience complications related to urinary retention Outcome: Not Progressing   Problem: Pain Managment: Goal: General experience of comfort will improve Outcome: Not Progressing   Problem: Safety: Goal: Ability to remain free from injury will improve Outcome: Not Progressing   Problem: Skin Integrity: Goal: Risk for impaired skin integrity will decrease Outcome: Not Progressing

## 2023-01-01 DIAGNOSIS — M1611 Unilateral primary osteoarthritis, right hip: Secondary | ICD-10-CM | POA: Diagnosis not present

## 2023-01-01 LAB — GLUCOSE, CAPILLARY
Glucose-Capillary: 145 mg/dL — ABNORMAL HIGH (ref 70–99)
Glucose-Capillary: 137 mg/dL — ABNORMAL HIGH (ref 70–99)
Glucose-Capillary: 149 mg/dL — ABNORMAL HIGH (ref 70–99)
Glucose-Capillary: 173 mg/dL — ABNORMAL HIGH (ref 70–99)

## 2023-01-01 MED ORDER — INTERFERON BETA-1A 44 MCG/0.5ML ~~LOC~~ SOSY
44.0000 ug | PREFILLED_SYRINGE | SUBCUTANEOUS | Status: DC
  Administered 2023-01-03: 44 ug via SUBCUTANEOUS
  Filled 2023-01-01: qty 0.5

## 2023-01-01 MED ORDER — INTERFERON BETA-1A 44 MCG/0.5ML ~~LOC~~ SOSY: 44.0000 ug | PREFILLED_SYRINGE | Freq: Once | SUBCUTANEOUS | Status: DC

## 2023-01-01 NOTE — Plan of Care (Signed)
  Problem: Education: Goal: Knowledge of the prescribed therapeutic regimen will improve Outcome: Progressing Goal: Understanding of discharge needs will improve Outcome: Progressing Goal: Individualized Educational Video(s) Outcome: Progressing   Problem: Activity: Goal: Ability to avoid complications of mobility impairment will improve Outcome: Progressing Goal: Ability to tolerate increased activity will improve Outcome: Progressing   Problem: Clinical Measurements: Goal: Postoperative complications will be avoided or minimized Outcome: Progressing   Problem: Pain Management: Goal: Pain level will decrease with appropriate interventions Outcome: Progressing   Problem: Skin Integrity: Goal: Will show signs of wound healing Outcome: Progressing   Problem: Education: Goal: Knowledge of General Education information will improve Description: Including pain rating scale, medication(s)/side effects and non-pharmacologic comfort measures Outcome: Progressing   Problem: Health Behavior/Discharge Planning: Goal: Ability to manage health-related needs will improve Outcome: Progressing   Problem: Clinical Measurements: Goal: Ability to maintain clinical measurements within normal limits will improve Outcome: Progressing Goal: Will remain free from infection Outcome: Progressing Goal: Diagnostic test results will improve Outcome: Progressing Goal: Respiratory complications will improve Outcome: Progressing Goal: Cardiovascular complication will be avoided Outcome: Progressing   Problem: Activity: Goal: Risk for activity intolerance will decrease Outcome: Progressing   Problem: Nutrition: Goal: Adequate nutrition will be maintained Outcome: Progressing   Problem: Coping: Goal: Level of anxiety will decrease Outcome: Progressing   Problem: Elimination: Goal: Will not experience complications related to bowel motility Outcome: Progressing Goal: Will not experience  complications related to urinary retention Outcome: Progressing   Problem: Pain Managment: Goal: General experience of comfort will improve Outcome: Progressing   Problem: Safety: Goal: Ability to remain free from injury will improve Outcome: Progressing   Problem: Skin Integrity: Goal: Risk for impaired skin integrity will decrease Outcome: Progressing   Problem: Education: Goal: Ability to describe self-care measures that may prevent or decrease complications (Diabetes Survival Skills Education) will improve Outcome: Progressing Goal: Individualized Educational Video(s) Outcome: Progressing   Problem: Coping: Goal: Ability to adjust to condition or change in health will improve Outcome: Progressing   Problem: Fluid Volume: Goal: Ability to maintain a balanced intake and output will improve Outcome: Progressing   Problem: Health Behavior/Discharge Planning: Goal: Ability to identify and utilize available resources and services will improve Outcome: Progressing Goal: Ability to manage health-related needs will improve Outcome: Progressing   Problem: Metabolic: Goal: Ability to maintain appropriate glucose levels will improve Outcome: Progressing   Problem: Nutritional: Goal: Maintenance of adequate nutrition will improve Outcome: Progressing Goal: Progress toward achieving an optimal weight will improve Outcome: Progressing   Problem: Skin Integrity: Goal: Risk for impaired skin integrity will decrease Outcome: Progressing   Problem: Tissue Perfusion: Goal: Adequacy of tissue perfusion will improve Outcome: Progressing

## 2023-01-01 NOTE — Plan of Care (Signed)
  Problem: Education: Goal: Knowledge of the prescribed therapeutic regimen will improve Outcome: Progressing   Problem: Pain Management: Goal: Pain level will decrease with appropriate interventions Outcome: Progressing   Problem: Skin Integrity: Goal: Will show signs of wound healing Outcome: Progressing   Problem: Activity: Goal: Risk for activity intolerance will decrease Outcome: Progressing   Problem: Pain Managment: Goal: General experience of comfort will improve Outcome: Progressing   Problem: Safety: Goal: Ability to remain free from injury will improve Outcome: Progressing   Problem: Skin Integrity: Goal: Risk for impaired skin integrity will decrease Outcome: Progressing

## 2023-01-01 NOTE — Progress Notes (Signed)
Physical Therapy Treatment Patient Details Name: Alisha Martinez MRN: [id number] DOB: [birth year] Today's Date: 01/01/2023   History of Present Illness Pt is a 67 y.o. F who presents 12/28/2022 s/p R THA. Noted tachycardia with exertion during PT sessions 1/3 and 1/4. Significant PMH: MS, DM, fibromyalgia.    PT Comments    Pt supine in bed on arrival.  Pt reports she sat in chair x 2 hours this am.  Pt is eager to mobilize.  Pt tolerated increased activity with decreased assistance.  She continues to make slow and steady progress.  Will continue to follow this admission.     Recommendations for follow up therapy are one component of a multi-disciplinary discharge planning process, led by the attending physician.  Recommendations may be updated based on patient status, additional functional criteria and insurance authorization.  Follow Up Recommendations  Follow physician's recommendations for discharge plan and follow up therapies Can patient physically be transported by private vehicle: Yes   Assistance Recommended at Discharge PRN  Patient can return home with the following A little help with walking and/or transfers;A little help with bathing/dressing/bathroom;Assistance with cooking/housework;Assist for transportation;Help with stairs or ramp for entrance   Equipment Recommendations  BSC/3in1    Recommendations for Other Services       Precautions / Restrictions Precautions Precautions: Fall Precaution Comments: monitor HR/O2 Restrictions Weight Bearing Restrictions: Yes RLE Weight Bearing: Weight bearing as tolerated     Mobility  Bed Mobility Overal bed mobility: Needs Assistance Bed Mobility: Supine to Sit     Supine to sit: HOB elevated, Min assist     General bed mobility comments: Increased time to initiate/perform, pt using bed rails and gt belt this session.  Required min assistance to move to edge of bed.    Transfers Overall transfer level: Needs  assistance Equipment used: Rolling walker (2 wheels) Transfers: Sit to/from Stand Sit to Stand: Min guard           General transfer comment: Cues for hand placement and forward advancement of RLE forward. Pt slow during transfer but progressing well.    Ambulation/Gait Ambulation/Gait assistance: Min assist Gait Distance (Feet): 60 Feet (+ 10 ft) Assistive device: Rolling walker (2 wheels) Gait Pattern/deviations: Step-to pattern, Decreased step length - right, Shuffle, Antalgic, Trunk flexed, Step-through pattern       General Gait Details: Pt slow and guarded but able to progress to step through pattern this session.   Stairs             Wheelchair Mobility    Modified Rankin (Stroke Patients Only)       Balance Overall balance assessment: Needs assistance Sitting-balance support: Feet supported Sitting balance-Leahy Scale: Poor Sitting balance - Comments: supervision for sitting with 1 UE support   Standing balance support: Bilateral upper extremity supported, Reliant on assistive device for balance Standing balance-Leahy Scale: Poor Standing balance comment: min guard static, minA dynamic balance with RW                            Cognition Arousal/Alertness: Awake/alert Behavior During Therapy: WFL for tasks assessed/performed Overall Cognitive Status: Within Functional Limits for tasks assessed Area of Impairment: Following commands, Safety/judgement, Problem solving, Attention                   Current Attention Level: Focused   Following Commands: Follows one step commands consistently, Follows multi-step commands with increased time Safety/Judgement: Decreased awareness of  safety   Problem Solving: Requires verbal cues General Comments: Improved alertness, per MD she did not have stronger PO meds today and another med was also changed, MD clearance for session after Echo results in, pt with improved command following and safety  awareness this evening.        Exercises Total Joint Exercises Ankle Circles/Pumps: AROM, Both, 10 reps, Supine Quad Sets: AROM, Right, 10 reps, Supine Heel Slides: AROM, Right, 10 reps, Supine Hip ABduction/ADduction: AROM, Right, 10 reps, Supine Long Arc Quad: AROM, Right, 10 reps, Seated    General Comments        Pertinent Vitals/Pain Pain Assessment Pain Assessment: Faces Faces Pain Scale: Hurts even more Pain Location: R hip/ RLE with movement; she reports minimal to no resting pain Pain Descriptors / Indicators: Discomfort, Grimacing, Guarding, Sharp Pain Intervention(s): Monitored during session, Repositioned    Home Living                          Prior Function            PT Goals (current goals can now be found in the care plan section) Acute Rehab PT Goals Patient Stated Goal: wants to go to rehab Potential to Achieve Goals: Good Progress towards PT goals: Progressing toward goals    Frequency    7X/week      PT Plan Current plan remains appropriate    Co-evaluation              AM-PAC PT "6 Clicks" Mobility   Outcome Measure  Help needed turning from your back to your side while in a flat bed without using bedrails?: A Lot Help needed moving from lying on your back to sitting on the side of a flat bed without using bedrails?: A Lot Help needed moving to and from a bed to a chair (including a wheelchair)?: A Little Help needed standing up from a chair using your arms (e.g., wheelchair or bedside chair)?: A Little Help needed to walk in hospital room?: A Little Help needed climbing 3-5 steps with a railing? : A Little 6 Click Score: 16    End of Session Equipment Utilized During Treatment: Gait belt Activity Tolerance: Patient tolerated treatment well Patient left: with call bell/phone within reach;in chair;with chair alarm set Nurse Communication: Mobility status;Precautions;Other (comment) PT Visit Diagnosis: Unsteadiness  on feet (R26.81);Difficulty in walking, not elsewhere classified (R26.2);Pain Pain - Right/Left: Right Pain - part of body: Hip     Time: 6010-9323 PT Time Calculation (min) (ACUTE ONLY): 37 min  Charges:  $Gait Training: 8-22 mins $Therapeutic Activity: 8-22 mins                     Erasmo Leventhal , PTA Acute Rehabilitation Services Office [phone]    Caymen Dubray Eli Hose 01/01/2023, 1:29 PM

## 2023-01-01 NOTE — Progress Notes (Signed)
Subjective: 4 Days Post-Op Procedure(s) (LRB): RIGHT TOTAL HIP ARTHROPLASTY ANTERIOR APPROACH (Right) Patient reports pain as mild.  She looks much better and is highly motivated.  I do appreciate the hospitalist consultation on this patient and their input.  They have helped considerably with the patient feeling better.  Objective: Vital signs in last 24 hours: Temp:  [98.3 F (36.8 C)] 98.3 F (36.8 C) (01/06 0823) Pulse Rate:  [81-97] 81 (01/06 0823) Resp:  [20] 20 (01/06 0823) BP: (96-112)/(71-74) 96/71 (01/06 0823) SpO2:  [98 %] 98 % (01/06 0823)  Intake/Output from previous day: 01/05 0701 - 01/06 0700 In: 360 [P.O.:360] Out: -  Intake/Output this shift: Total I/O In: 240 [P.O.:240] Out: -   Recent Labs    12/30/22 0334 12/31/22 0806  HGB 11.5* 12.8   Recent Labs    12/30/22 0334 12/31/22 0806  WBC 10.7* 9.1  RBC 3.93 4.34  HCT 35.5* 38.0  PLT 208 211   Recent Labs    12/30/22 0334 12/31/22 0806  NA 132* 139  K 3.0* 3.7  CL 94* 97*  CO2 28 23  BUN 13 16  CREATININE 0.81 0.88  GLUCOSE 112* 98  CALCIUM 8.4* 8.8*   No results for input(s): "LABPT", "INR" in the last 72 hours.  Sensation intact distally Intact pulses distally Dorsiflexion/Plantar flexion intact Incision: scant drainage   Assessment/Plan: 4 Days Post-Op Procedure(s) (LRB): RIGHT TOTAL HIP ARTHROPLASTY ANTERIOR APPROACH (Right) Up with therapy Discharge to SNF on Monday.      Mcarthur Rossetti 01/01/2023, 1:43 PM

## 2023-01-01 NOTE — Plan of Care (Signed)
  Problem: Education: Goal: Knowledge of the prescribed therapeutic regimen will improve 01/01/2023 1146 by Trixie Deis, RN Outcome: Progressing 01/01/2023 1145 by Trixie Deis, RN Outcome: Progressing   Problem: Activity: Goal: Ability to avoid complications of mobility impairment will improve Outcome: Progressing   Problem: Pain Management: Goal: Pain level will decrease with appropriate interventions 01/01/2023 1146 by Trixie Deis, RN Outcome: Progressing 01/01/2023 1145 by Trixie Deis, RN Outcome: Progressing   Problem: Skin Integrity: Goal: Will show signs of wound healing 01/01/2023 1146 by Trixie Deis, RN Outcome: Progressing 01/01/2023 1145 by Trixie Deis, RN Outcome: Progressing   Problem: Activity: Goal: Risk for activity intolerance will decrease Outcome: Progressing   Problem: Pain Managment: Goal: General experience of comfort will improve 01/01/2023 1146 by Trixie Deis, RN Outcome: Progressing 01/01/2023 1145 by Trixie Deis, RN Outcome: Progressing   Problem: Safety: Goal: Ability to remain free from injury will improve 01/01/2023 1146 by Trixie Deis, RN Outcome: Progressing 01/01/2023 1145 by Trixie Deis, RN Outcome: Progressing   Problem: Skin Integrity: Goal: Risk for impaired skin integrity will decrease 01/01/2023 1146 by Trixie Deis, RN Outcome: Progressing 01/01/2023 1145 by Trixie Deis, RN Outcome: Progressing

## 2023-01-01 NOTE — Progress Notes (Signed)
Mobility Specialist Progress Note   01/01/23 1500  Mobility  Activity Ambulated with assistance in hallway;Dangled on edge of bed  Level of Assistance Standby assist, set-up cues, supervision of patient - no hands on  Assistive Device Front wheel walker  Distance Ambulated (ft) 80 ft  Range of Motion/Exercises Active;All extremities  RLE Weight Bearing WBAT  Activity Response Tolerated well   Pre Mobility:  HR 77 During Mobility: HR 95 Post Mobility: HR 81  Patient received in supine and agreeable to participate. Requires min A to assist RLE for bed mobility. Stood with minimal HHA + cues for hand/foot placement. Ambulated min guard to supervision with slow steady gait. Returned to room without complaint or incident. Returned to supine with minimal assistance. Was left with all needs met, call bell in reach.   Martinique Lason Eveland, BS EXP Mobility Specialist Please contact via SecureChat or Rehab office at [phone]

## 2023-01-01 NOTE — Progress Notes (Addendum)
PROGRESS NOTE  Alisha Martinez SEG:[id number] DOB: [birth year] DOA: 12/28/2022 PCP: Patient, No Pcp Per   LOS: 2 days   Brief Narrative / Interim history: 67 y.o. female with past medical history of DM, HTN, MS, and OSA who presented on 1/2 for hip replacement.  She was admitted to the orthopedic surgery, was found to have significant sinus tachycardia and the hospitalist team was consulted.  She is asymptomatic with this  Subjective / 24h Interval events: Doing well this morning, denies any chest pain, denies any shortness of breath.  Denies any palpitations.  Assesement and Plan: Principal Problem:   Unilateral primary osteoarthritis, right hip Active Problems:   Sinus tachycardia   Multiple sclerosis (HCC)   Urinary dysfunction   Status post total replacement of right hip   Diabetes mellitus without complication (HCC)   Hypertension   Dyslipidemia   OSA (obstructive sleep apnea)   Principal problem Sinus tachycardia - possibly in the postop setting.  She is asymptomatic with this.  Telemetry reviewed shows fairly good control heart rate mainly in the 70s, with 1 episode of SVT into the 160s.  She was started on metoprolol 25 twice daily, continue.  Due to soft blood pressures, I am unable to uptitrate this.  2D echo was done on 1/5 shows an EF of 40-45% with anteroseptal/anterior/inferior septal wall akinesis.  Dr. Lorin Mercy discussed with Dr. Phineas Inches with cardiology, and given proximity to surgery and no active chest pain or ACS type symptoms, no intervention is planned in acute setting and cardiology will follow as an outpatient.  Continue daily aspirin, statin, beta-blockers  Active problems Right total hip replacement - elective, per primary team  OSA-has not worn CPAP in years, may be contributing to tachycardia.  Continue CPAP  Hyperlipidemia-continue statin  Essential hypertension-stop chlorthalidone, she has angioedema with ACE, currently on Lopressor but she seems to be  tolerating it well  DM2-recent A1c 7.2.  Cover with sliding scale  Urinary dysfunction-continue home medications  MS-neurology follow-up as an outpatient  Needs SNF, discharge timing per primary, from medicine standpoint could go at any time  Scheduled Meds:  aspirin  81 mg Oral BID   atorvastatin  80 mg Oral Daily   baclofen  10 mg Oral TID   docusate sodium  100 mg Oral BID   insulin aspart  0-15 Units Subcutaneous TID WC   interferon beta-1a  44 mcg Subcutaneous Once   [START ON 01/03/2023] interferon beta-1a  44 mcg Subcutaneous Once per day on Mon Wed Fri   metoprolol tartrate  25 mg Oral BID   mirabegron ER  50 mg Oral Daily   pantoprazole  40 mg Oral Daily   potassium chloride  40 mEq Oral BID   pregabalin  100 mg Oral TID   Continuous Infusions:  sodium chloride Stopped (12/28/22 2206)   PRN Meds:.acetaminophen, ALPRAZolam, alum & mag hydroxide-simeth, diphenhydrAMINE, HYDROcodone-acetaminophen, HYDROmorphone (DILAUDID) injection, menthol-cetylpyridinium **OR** phenol, metoCLOPramide **OR** metoCLOPramide (REGLAN) injection, ondansetron **OR** ondansetron (ZOFRAN) IV, polyethylene glycol  Current Outpatient Medications  Medication Instructions   aspirin 81 mg, Oral, Daily   aspirin 81 mg, Oral, 2 times daily   atorvastatin (LIPITOR) 80 mg, Oral, Daily   baclofen (LIORESAL) 10 mg, Oral, 3 times daily   chlorthalidone (HYGROTON) 25 mg, Oral, Daily   diclofenac (VOLTAREN) 75 mg, Oral, 2 times daily   ergocalciferol (VITAMIN D2) 50,000 Units, Oral, Every Fri   HYDROcodone-acetaminophen (NORCO/VICODIN) 5-325 MG tablet 1-2 tablets, Oral, Every 6 hours PRN  Jardiance 25 mg, Oral, Daily   levocetirizine (XYZAL) 5 mg, Oral, Every evening   metFORMIN (GLUCOPHAGE) 1,000 mg, Oral, 2 times daily with meals   mirabegron ER (MYRBETRIQ) 50 mg, Oral, Daily   oxybutynin (DITROPAN XL) 15 mg, Oral, Daily at bedtime   potassium chloride SA (KLOR-CON M) 20 MEQ tablet 20 mEq, Oral, 2  times daily   pregabalin (LYRICA) 100 mg, Oral, 3 times daily   Rebif 44 mcg, Subcutaneous, Every M-W-F   traMADol (ULTRAM) 50-100 mg, Oral, Every 6 hours PRN    Diet Orders (From admission, onward)     Start     Ordered   12/28/22 1429  Diet Carb Modified Fluid consistency: Thin; Room service appropriate? Yes  Diet effective now       Question Answer Comment  Calorie Level Medium 1600-2000   Fluid consistency: Thin   Room service appropriate? Yes      12/28/22 1428            DVT prophylaxis: SCDs Start: 12/28/22 1429   Lab Results  Component Value Date   PLT 211 12/31/2022      Code Status: Full Code  Family Communication: daughter at bedside   Status is: Inpatient  Remains inpatient appropriate because: severity of illness  Level of care: Med-Surg  Objective: Vitals:   12/31/22 0447 12/31/22 0846 12/31/22 2100 01/01/23 0823  BP: 115/70 (!) 122/96 112/74 96/71  Pulse: (!) 108 (!) 108 97 81  Resp: (!) '21 20  20  '$ Temp:  98 F (36.7 C)  98.3 F (36.8 C)  TempSrc:  Axillary  Oral  SpO2: 98% 98% 98% 98%  Weight:      Height:        Intake/Output Summary (Last 24 hours) at 01/01/2023 0848 Last data filed at 01/01/2023 3664 Gross per 24 hour  Intake 480 ml  Output --  Net 480 ml   Wt Readings from Last 3 Encounters:  12/28/22 97.1 kg  12/22/22 96.6 kg  11/22/22 98 kg    Examination:  Constitutional: NAD Eyes: no scleral icterus ENMT: Mucous membranes are moist.  Neck: normal, supple Respiratory: clear to auscultation bilaterally, no wheezing, no crackles. Normal respiratory effort. No accessory muscle use.  Cardiovascular: Regular rate and rhythm, no murmurs / rubs / gallops. No LE edema.  Abdomen: non distended, no tenderness. Bowel sounds positive.  Musculoskeletal: no clubbing / cyanosis.   Data Reviewed: I have independently reviewed following labs and imaging studies   CBC Recent Labs  Lab 12/29/22 0306 12/30/22 0334 12/31/22 0806   WBC 10.7* 10.7* 9.1  HGB 11.9* 11.5* 12.8  HCT 35.7* 35.5* 38.0  PLT 231 208 211  MCV 88.4 90.3 87.6  MCH 29.5 29.3 29.5  MCHC 33.3 32.4 33.7  RDW 14.2 14.4 14.1  LYMPHSABS  --   --  2.0  MONOABS  --   --  1.0  EOSABS  --   --  0.1  BASOSABS  --   --  0.0    Recent Labs  Lab 12/28/22 0824 12/29/22 0306 12/30/22 0334 12/31/22 0806 12/31/22 1001  NA 138 136 132* 139  --   K 2.9* 3.7 3.0* 3.7  --   CL 97* 98 94* 97*  --   CO2 '29 24 28 23  '$ --   GLUCOSE 125* 125* 112* 98  --   BUN '18 19 13 16  '$ --   CREATININE 1.00 0.92 0.81 0.88  --   CALCIUM 9.2 8.5*  8.4* 8.8*  --   AST 34  --   --  37  --   ALT 39  --   --  23  --   ALKPHOS 78  --   --  55  --   BILITOT 0.6  --   --  0.6  --   ALBUMIN 3.6  --   --  2.9*  --   LATICACIDVEN  --   --   --   --  2.2*    ------------------------------------------------------------------------------------------------------------------ No results for input(s): "CHOL", "HDL", "LDLCALC", "TRIG", "CHOLHDL", "LDLDIRECT" in the last 72 hours.  Lab Results  Component Value Date   HGBA1C 7.2 (H) 12/22/2022   ------------------------------------------------------------------------------------------------------------------ No results for input(s): "TSH", "T4TOTAL", "T3FREE", "THYROIDAB" in the last 72 hours.  Invalid input(s): "FREET3"  Cardiac Enzymes No results for input(s): "CKMB", "TROPONINI", "MYOGLOBIN" in the last 168 hours.  Invalid input(s): "CK" ------------------------------------------------------------------------------------------------------------------ No results found for: "BNP"  CBG: Recent Labs  Lab 12/28/22 1053 12/30/22 1539 12/31/22 1250 12/31/22 1623 12/31/22 2021  GLUCAP 121* 142* 124* 136* 128*    Recent Results (from the past 240 hour(s))  Surgical pcr screen     Status: None   Collection Time: 12/22/22  9:32 AM   Specimen: Nasal Mucosa; Nasal Swab  Result Value Ref Range Status   MRSA, PCR NEGATIVE  NEGATIVE Final   Staphylococcus aureus NEGATIVE NEGATIVE Final    Comment: (NOTE) The Xpert SA Assay (FDA approved for NASAL specimens in patients 57 years of age and older), is one component of a comprehensive surveillance program. It is not intended to diagnose infection nor to guide or monitor treatment. Performed at [facility], [place] [address], [place], [place] [address]      Radiology Studies: ECHOCARDIOGRAM COMPLETE  Result Date: 12/31/2022    ECHOCARDIOGRAM REPORT   Patient Name:   Alisha Martinez Date of Exam: 12/31/2022 Medical Rec #:  [id number]   Height:       64.0 in Accession #:    [id number]  Weight:       214.0 lb Date of Birth:  03/11/1956  BSA:          2.013 m Patient Age:    31 years    BP:           122/96 mmHg Patient Gender: F           HR:           102 bpm. Exam Location:  Inpatient Procedure: 2D Echo, Cardiac Doppler, Color Doppler and Intracardiac            Opacification Agent Indications:    R00.8 Other abnormalities of heart beat  History:        Patient has no prior history of Echocardiogram examinations.                 Abnormal ECG; Arrythmias:Tachycardia.  Sonographer:    Roseanna Rainbow RDCS Referring Phys: [id number] JENNIFER YATES  Sonographer Comments: Suboptimal parasternal window, suboptimal subcostal window, suboptimal apical window, Technically difficult study due to poor echo windows and patient is obese. Image acquisition challenging due to patient body habitus. Extremely difficult due to habitus. Waited 30 mins to get patient back in bed from toileting. IMPRESSIONS  1. Akinesis of the anteroseptal/anterior/inferoseptal wall from base to apex. Left ventricular ejection fraction, by estimation, is 40 to 45%. The left ventricle has mildly decreased function. The left ventricle demonstrates global hypokinesis. Left ventricular diastolic parameters are indeterminate.  2. Right ventricular systolic function is normal. The right ventricular size is normal. Tricuspid  regurgitation signal is inadequate for assessing PA pressure.  3. No evidence of mitral valve regurgitation.  4. The aortic valve is grossly normal. Aortic valve regurgitation is not visualized.  5. The inferior vena cava is normal in size with greater than 50% respiratory variability, suggesting right atrial pressure of 3 mmHg. Comparison(s): No prior Echocardiogram. Conclusion(s)/Recommendation(s): Has wall motion abnormality, consider ischemic evaluation. FINDINGS  Left Ventricle: Akinesis of the anteroseptal/anterior/inferoseptal wall from base to apex. Left ventricular ejection fraction, by estimation, is 40 to 45%. The left ventricle has mildly decreased function. The left ventricle demonstrates global hypokinesis. Definity contrast agent was given IV to delineate the left ventricular endocardial borders. The left ventricular internal cavity size was normal in size. There is no left ventricular hypertrophy. Abnormal (paradoxical) septal motion, consistent with left bundle branch block. Left ventricular diastolic parameters are indeterminate. Right Ventricle: The right ventricular size is normal. Right ventricular systolic function is normal. Tricuspid regurgitation signal is inadequate for assessing PA pressure. Left Atrium: Left atrial size was normal in size. Right Atrium: Right atrial size was normal in size. Pericardium: There is no evidence of pericardial effusion. Mitral Valve: No evidence of mitral valve regurgitation. Tricuspid Valve: Tricuspid valve regurgitation is not demonstrated. Aortic Valve: The aortic valve is grossly normal. Aortic valve regurgitation is not visualized. Pulmonic Valve: Pulmonic valve regurgitation is not visualized. Aorta: The aortic root and ascending aorta are structurally normal, with no evidence of dilitation. Venous: The inferior vena cava is normal in size with greater than 50% respiratory variability, suggesting right atrial pressure of 3 mmHg. IAS/Shunts: The  interatrial septum was not well visualized.  LEFT VENTRICLE PLAX 2D LVIDd:         4.30 cm      Diastology LVIDs:         3.60 cm      LV e' medial:    4.79 cm/s LV PW:         1.00 cm      LV E/e' medial:  12.5 LV IVS:        1.00 cm      LV e' lateral:   5.11 cm/s LVOT diam:     2.40 cm      LV E/e' lateral: 11.7 LV SV:         58 LV SV Index:   29 LVOT Area:     4.52 cm  LV Volumes (MOD) LV vol d, MOD A2C: 118.0 ml LV vol d, MOD A4C: 114.0 ml LV vol s, MOD A2C: 70.0 ml LV vol s, MOD A4C: 76.7 ml LV SV MOD A2C:     48.0 ml LV SV MOD A4C:     114.0 ml LV SV MOD BP:      43.1 ml RIGHT VENTRICLE RV S prime:     10.20 cm/s TAPSE (M-mode): 1.4 cm LEFT ATRIUM             Index        RIGHT ATRIUM          Index LA diam:        2.60 cm 1.29 cm/m   RA Area:     7.76 cm LA Vol (A2C):   36.2 ml 17.98 ml/m  RA Volume:   12.60 ml 6.26 ml/m LA Vol (A4C):   29.0 ml 14.40 ml/m LA Biplane Vol: 32.2 ml 15.99 ml/m  AORTIC VALVE LVOT  Vmax:   92.70 cm/s LVOT Vmean:  58.950 cm/s LVOT VTI:    0.128 m  AORTA Ao Root diam: 3.30 cm Ao Asc diam:  3.20 cm MITRAL VALVE MV Area (PHT): 10.25 cm   SHUNTS MV Decel Time: 74 msec     Systemic VTI:  0.13 m MV E velocity: 59.70 cm/s  Systemic Diam: 2.40 cm MV A velocity: 83.80 cm/s MV E/A ratio:  0.71 Landscape architect signed by Phineas Inches Signature Date/Time: 12/31/2022/3:36:35 PM    Final      Marzetta Board, MD, PhD [facility]  Between 7 am - 7 pm I am available, please contact me via Amion (for emergencies) or Securechat (non urgent messages)  Between 7 pm - 7 am I am not available, please contact night coverage MD/APP via Amion

## 2023-01-02 DIAGNOSIS — M1611 Unilateral primary osteoarthritis, right hip: Secondary | ICD-10-CM | POA: Diagnosis not present

## 2023-01-02 LAB — COMPREHENSIVE METABOLIC PANEL
ALT: 27 U/L (ref 0–44)
AST: 33 U/L (ref 15–41)
Albumin: 2.9 g/dL — ABNORMAL LOW (ref 3.5–5.0)
Alkaline Phosphatase: 56 U/L (ref 38–126)
Anion gap: 11 (ref 5–15)
BUN: 22 mg/dL (ref 8–23)
CO2: 26 mmol/L (ref 22–32)
Calcium: 9.3 mg/dL (ref 8.9–10.3)
Chloride: 99 mmol/L (ref 98–111)
Creatinine, Ser: 0.87 mg/dL (ref 0.44–1.00)
GFR, Estimated: >60 mL/min (ref >60)
Glucose, Bld: 132 mg/dL — ABNORMAL HIGH (ref 70–99)
Potassium: 3.7 mmol/L (ref 3.5–5.1)
Sodium: 136 mmol/L (ref 135–145)
Total Bilirubin: 0.4 mg/dL (ref 0.3–1.2)
Total Protein: 7.4 g/dL (ref 6.5–8.1)

## 2023-01-02 LAB — CBC
HCT: 37.3 % (ref 36.0–46.0)
Hemoglobin: 12.5 g/dL (ref 12.0–15.0)
MCH: 29.4 pg (ref 26.0–34.0)
MCHC: 33.5 g/dL (ref 30.0–36.0)
MCV: 87.8 fL (ref 80.0–100.0)
Platelets: 255 10*3/uL (ref 150–400)
RBC: 4.25 MIL/uL (ref 3.87–5.11)
RDW: 14.1 % (ref 11.5–15.5)
WBC: 9.4 10*3/uL (ref 4.0–10.5)
nRBC: 0 % (ref 0.0–0.2)

## 2023-01-02 LAB — GLUCOSE, CAPILLARY
Glucose-Capillary: 129 mg/dL — ABNORMAL HIGH (ref 70–99)
Glucose-Capillary: 181 mg/dL — ABNORMAL HIGH (ref 70–99)

## 2023-01-02 LAB — MAGNESIUM: Magnesium: 1.7 mg/dL (ref 1.7–2.4)

## 2023-01-02 MED ORDER — METOPROLOL TARTRATE 25 MG PO TABS
37.5000 mg | ORAL_TABLET | Freq: Two times a day (BID) | ORAL | Status: DC
  Administered 2023-01-02 – 2023-01-04 (×5): 37.5 mg via ORAL
  Filled 2023-01-02 (×5): qty 1

## 2023-01-02 NOTE — Plan of Care (Signed)
  Problem: Education: Goal: Knowledge of the prescribed therapeutic regimen will improve Outcome: Progressing   Problem: Activity: Goal: Ability to avoid complications of mobility impairment will improve Outcome: Progressing   Problem: Skin Integrity: Goal: Will show signs of wound healing Outcome: Progressing   Problem: Health Behavior/Discharge Planning: Goal: Ability to manage health-related needs will improve Outcome: Progressing   Problem: Activity: Goal: Risk for activity intolerance will decrease Outcome: Progressing   Problem: Pain Managment: Goal: General experience of comfort will improve Outcome: Progressing   Problem: Safety: Goal: Ability to remain free from injury will improve Outcome: Progressing   Problem: Skin Integrity: Goal: Risk for impaired skin integrity will decrease Outcome: Progressing

## 2023-01-02 NOTE — Progress Notes (Addendum)
Physical Therapy Treatment Patient Details Name: Alisha Martinez MRN: [id number] DOB: [birth year] Today's Date: 01/02/2023   History of Present Illness Pt is a 67 y.o. F who presents 12/28/2022 s/p R THA. Noted tachycardia with exertion during PT sessions 1/3 and 1/4. Significant PMH: MS, DM, fibromyalgia.    PT Comments    Pt was able to start stair training, but does not currently have the ability to perform 17 stairs simulating her home entry.  She was able to do two today and progress gait a tiny bit further down the hallway.  She continues to want to go to SNF for rehab at discharge, but this will be up to conversations with her and her physician.  She reports questionable full time help at home as well.  PT will continue to follow acutely for safe mobility progression.  VSS throughout session today including HR in the 70s throughout.  Recommendations for follow up therapy are one component of a multi-disciplinary discharge planning process, led by the attending physician.  Recommendations may be updated based on patient status, additional functional criteria and insurance authorization.  Follow Up Recommendations  Follow physician's recommendations for discharge plan and follow up therapies Can patient physically be transported by private vehicle: Yes   Assistance Recommended at Discharge Intermittent Supervision/Assistance  Patient can return home with the following A little help with walking and/or transfers;A little help with bathing/dressing/bathroom;Assist for transportation;Help with stairs or ramp for entrance   Equipment Recommendations  BSC/3in1    Recommendations for Other Services       Precautions / Restrictions Precautions Precautions: Fall Precaution Comments: monitor HR/O2 Restrictions RLE Weight Bearing: Weight bearing as tolerated     Mobility  Bed Mobility Overal bed mobility: Needs Assistance Bed Mobility: Supine to Sit     Supine to sit: Min assist, HOB  elevated Sit to supine: Min assist   General bed mobility comments: Min assist to help progress right leg into and out of bed.  Pt refusing to sit in recliner today as she was left up for 4 hours yesterday.    Transfers Overall transfer level: Needs assistance Equipment used: Rolling walker (2 wheels) Transfers: Sit to/from Stand Sit to Stand: Min guard           General transfer comment: Min guard assit for safety, cues for safe hand placement.    Ambulation/Gait Ambulation/Gait assistance: Min assist Gait Distance (Feet): 100 Feet Assistive device: Rolling walker (2 wheels) Gait Pattern/deviations: Step-to pattern, Decreased step length - right, Shuffle, Antalgic, Trunk flexed, Step-through pattern Gait velocity: decreased Gait velocity interpretation: <1.31 ft/sec, indicative of household ambulator   General Gait Details: Cues for safest LE sequencing, upright posture, closer proximity to RW. Very slow gait.   Stairs Stairs: Yes Stairs assistance: Min assist Stair Management: Two rails, Step to pattern, Forwards Number of Stairs: 2 General stair comments: Per pt report she has 17 stairs to get into her apartment which is likely her biggest barrier to going home at this time. 2 steps were very difficult and fatigueing and she had to use two hand rails to be successful (she only has one at home).   Wheelchair Mobility    Modified Rankin (Stroke Patients Only)       Balance Overall balance assessment: Needs assistance Sitting-balance support: Feet supported Sitting balance-Leahy Scale: Good     Standing balance support: Bilateral upper extremity supported Standing balance-Leahy Scale: Poor Standing balance comment: Needs min assist and support from RW.  Cognition Arousal/Alertness: Awake/alert Behavior During Therapy: WFL for tasks assessed/performed Overall Cognitive Status: Within Functional Limits for tasks assessed                                           Exercises Total Joint Exercises Ankle Circles/Pumps: AROM, Both, 10 reps Quad Sets: AROM, Both, 10 reps Short Arc Quad: AROM, Right, 10 reps Heel Slides: AAROM, Right, 10 reps Hip ABduction/ADduction: AAROM, Right, 10 reps    General Comments        Pertinent Vitals/Pain Pain Assessment Pain Assessment: Faces Faces Pain Scale: Hurts even more Pain Location: R hip/ RLE with movement; she reports minimal to no resting pain Pain Descriptors / Indicators: Discomfort, Grimacing, Guarding, Sharp Pain Intervention(s): Limited activity within patient's tolerance, Monitored during session, Repositioned    Home Living                          Prior Function            PT Goals (current goals can now be found in the care plan section) Acute Rehab PT Goals Patient Stated Goal: wants to go to rehab Progress towards PT goals: Progressing toward goals    Frequency    7X/week      PT Plan Current plan remains appropriate    Co-evaluation              AM-PAC PT "6 Clicks" Mobility   Outcome Measure  Help needed turning from your back to your side while in a flat bed without using bedrails?: A Little Help needed moving from lying on your back to sitting on the side of a flat bed without using bedrails?: A Little Help needed moving to and from a bed to a chair (including a wheelchair)?: A Little Help needed standing up from a chair using your arms (e.g., wheelchair or bedside chair)?: A Little Help needed to walk in hospital room?: A Little Help needed climbing 3-5 steps with a railing? : A Little 6 Click Score: 18    End of Session Equipment Utilized During Treatment: Gait belt Activity Tolerance: Patient limited by fatigue;Patient limited by pain Patient left: in bed;with call bell/phone within reach;with bed alarm set   PT Visit Diagnosis: Unsteadiness on feet (R26.81);Difficulty in walking, not  elsewhere classified (R26.2);Pain Pain - Right/Left: Right Pain - part of body: Hip     Time: 1505-6979 PT Time Calculation (min) (ACUTE ONLY): 54 min  Charges:  $Gait Training: 38-52 mins $Therapeutic Exercise: 8-22 mins                     Verdene Lennert, PT, DPT  Acute Rehabilitation Secure chat is best for contact #[phone] office    01/02/2023, 4:35 PM

## 2023-01-02 NOTE — Plan of Care (Signed)
  Problem: Education: Goal: Knowledge of the prescribed therapeutic regimen will improve Outcome: Progressing Goal: Understanding of discharge needs will improve Outcome: Progressing Goal: Individualized Educational Video(s) Outcome: Progressing   Problem: Activity: Goal: Ability to avoid complications of mobility impairment will improve Outcome: Progressing Goal: Ability to tolerate increased activity will improve Outcome: Progressing   Problem: Clinical Measurements: Goal: Postoperative complications will be avoided or minimized Outcome: Progressing   Problem: Pain Management: Goal: Pain level will decrease with appropriate interventions Outcome: Progressing   Problem: Skin Integrity: Goal: Will show signs of wound healing Outcome: Progressing   Problem: Education: Goal: Knowledge of General Education information will improve Description: Including pain rating scale, medication(s)/side effects and non-pharmacologic comfort measures Outcome: Progressing   Problem: Health Behavior/Discharge Planning: Goal: Ability to manage health-related needs will improve Outcome: Progressing   Problem: Clinical Measurements: Goal: Ability to maintain clinical measurements within normal limits will improve Outcome: Progressing Goal: Will remain free from infection Outcome: Progressing Goal: Diagnostic test results will improve Outcome: Progressing Goal: Respiratory complications will improve Outcome: Progressing Goal: Cardiovascular complication will be avoided Outcome: Progressing   Problem: Activity: Goal: Risk for activity intolerance will decrease Outcome: Progressing   Problem: Nutrition: Goal: Adequate nutrition will be maintained Outcome: Progressing   Problem: Coping: Goal: Level of anxiety will decrease Outcome: Progressing   Problem: Elimination: Goal: Will not experience complications related to bowel motility Outcome: Progressing Goal: Will not experience  complications related to urinary retention Outcome: Progressing   Problem: Pain Managment: Goal: General experience of comfort will improve Outcome: Progressing   Problem: Safety: Goal: Ability to remain free from injury will improve Outcome: Progressing   Problem: Skin Integrity: Goal: Risk for impaired skin integrity will decrease Outcome: Progressing   Problem: Education: Goal: Ability to describe self-care measures that may prevent or decrease complications (Diabetes Survival Skills Education) will improve Outcome: Progressing Goal: Individualized Educational Video(s) Outcome: Progressing   Problem: Coping: Goal: Ability to adjust to condition or change in health will improve Outcome: Progressing   Problem: Fluid Volume: Goal: Ability to maintain a balanced intake and output will improve Outcome: Progressing   Problem: Health Behavior/Discharge Planning: Goal: Ability to identify and utilize available resources and services will improve Outcome: Progressing Goal: Ability to manage health-related needs will improve Outcome: Progressing   Problem: Metabolic: Goal: Ability to maintain appropriate glucose levels will improve Outcome: Progressing   Problem: Nutritional: Goal: Maintenance of adequate nutrition will improve Outcome: Progressing Goal: Progress toward achieving an optimal weight will improve Outcome: Progressing   Problem: Skin Integrity: Goal: Risk for impaired skin integrity will decrease Outcome: Progressing   Problem: Tissue Perfusion: Goal: Adequacy of tissue perfusion will improve Outcome: Progressing

## 2023-01-02 NOTE — Progress Notes (Signed)
Patient ID: Alisha Martinez, female   DOB: May 05, 1956, 67 y.o.   MRN: [id number]   Subjective: 5 Days Post-Op Procedure(s) (LRB): RIGHT TOTAL HIP ARTHROPLASTY ANTERIOR APPROACH (Right) Patient reports pain as moderate.    Objective: Vital signs in last 24 hours: Temp:  [98 F (36.7 C)-99 F (37.2 C)] 99 F (37.2 C) (01/07 0733) Pulse Rate:  [72-78] 73 (01/07 0947) Resp:  [17-20] 20 (01/07 0733) BP: (100-118)/(60-66) 112/66 (01/07 0947) SpO2:  [91 %-100 %] 91 % (01/07 0733)  Intake/Output from previous day: 01/06 0701 - 01/07 0700 In: 600 [P.O.:600] Out: -  Intake/Output this shift: No intake/output data recorded.  Recent Labs    12/31/22 0806 01/02/23 0252  HGB 12.8 12.5   Recent Labs    12/31/22 0806 01/02/23 0252  WBC 9.1 9.4  RBC 4.34 4.25  HCT 38.0 37.3  PLT 211 255   Recent Labs    12/31/22 0806 01/02/23 0252  NA 139 136  K 3.7 3.7  CL 97* 99  CO2 23 26  BUN 16 22  CREATININE 0.88 0.87  GLUCOSE 98 132*  CALCIUM 8.8* 9.3   No results for input(s): "LABPT", "INR" in the last 72 hours.  Neurologically intact No results found.  Assessment/Plan: 5 Days Post-Op Procedure(s) (LRB): RIGHT TOTAL HIP ARTHROPLASTY ANTERIOR APPROACH (Right) Up with therapy Discharge to SNF  Marybelle Killings 01/02/2023, 10:51 AM

## 2023-01-02 NOTE — TOC Progression Note (Signed)
Transition of Care [facility]) - Progression Note    Patient Details  Name: Alisha Martinez MRN: [id number] Date of Birth: [birth year]  Transition of Care [facility]) CM/SW Contact  Ina Homes, [place] Phone Number: 01/02/2023, 12:15 PM  Clinical Narrative:     SW spoke with [facility] [phone]) initiated Josem Kaufmann for [place]  Ref #[id number] Clinicals Faxed: [phone]  Expected Discharge Plan: [place] Barriers to Discharge: SNF Pending bed offer  Expected Discharge Plan and Services In-house Referral: Clinical Social Work   Post Acute Care Choice: [place] Living arrangements for the past 2 months: Single Family Home                                       Social Determinants of Health (SDOH) Interventions SDOH Screenings   Tobacco Use: Low Risk  (12/28/2022)    Readmission Risk Interventions     No data to display

## 2023-01-02 NOTE — Progress Notes (Signed)
Patient refused CPAP for the night  

## 2023-01-02 NOTE — Progress Notes (Signed)
PROGRESS NOTE  Alisha Martinez MGN:[id number] DOB: 08-13-1956 DOA: 12/28/2022 PCP: Alisha Martinez, No Pcp Per   LOS: 3 days   Brief Narrative / Interim history: 67 y.o. female with past medical history of DM, HTN, MS, and OSA who presented on 1/2 for hip replacement.  She was admitted to the orthopedic surgery, was found to have significant sinus tachycardia and the hospitalist team was consulted.  She is asymptomatic with this  Subjective / 24h Interval events: Doing well, no chest pain, no palpitations. Assesement and Plan: Principal Problem:   Unilateral primary osteoarthritis, right hip Active Problems:   Sinus tachycardia   Multiple sclerosis (HCC)   Urinary dysfunction   Status post total replacement of right hip   Diabetes mellitus without complication (HCC)   Hypertension   Dyslipidemia   OSA (obstructive sleep apnea)   Principal problem Sinus tachycardia - possibly in the postop setting.  She is asymptomatic with this. 2D echo was done on 1/5 shows an EF of 40-45% with anteroseptal/anterior/inferior septal wall akinesis.  Dr. Lorin Mercy discussed with Dr. Phineas Inches with cardiology, and given proximity to surgery and no active chest pain or ACS type symptoms, no intervention is planned in acute setting and cardiology will follow as an outpatient.  Continue daily aspirin, statin, beta-blockers.  Telemetry reviewed this morning shows 7 rhythm changes from sinus in the 70s to slow SVT into the 120s, 130s, sustained, then converting back to sinus rhythm.  Case discussed with Dr. Quentin Ore with cardiology, he agrees with metoprolol.  It does not look like a flutter.  Uptitrate metoprolol today to 37.5  Active problems Right total hip replacement - elective, per primary team  OSA-has not worn CPAP in years, may be contributing to tachycardia.  Continue CPAP  Hyperlipidemia-continue statin  Essential hypertension-stop chlorthalidone, she has angioedema with ACE, currently on Lopressor but she  seems to be tolerating it well  DM2-recent A1c 7.2.  Cover with sliding scale  Urinary dysfunction-continue home medications  MS-neurology follow-up as an outpatient  Needs SNF, discharge timing per primary, from medicine standpoint could go at any time  Scheduled Meds:  aspirin  81 mg Oral BID   atorvastatin  80 mg Oral Daily   baclofen  10 mg Oral TID   docusate sodium  100 mg Oral BID   insulin aspart  0-15 Units Subcutaneous TID WC   interferon beta-1a  44 mcg Subcutaneous Once   [START ON 01/03/2023] interferon beta-1a  44 mcg Subcutaneous Once per day on Mon Wed Fri   metoprolol tartrate  37.5 mg Oral BID   mirabegron ER  50 mg Oral Daily   pantoprazole  40 mg Oral Daily   potassium chloride  40 mEq Oral BID   pregabalin  100 mg Oral TID   Continuous Infusions:  sodium chloride Stopped (12/28/22 2206)   PRN Meds:.acetaminophen, ALPRAZolam, alum & mag hydroxide-simeth, diphenhydrAMINE, HYDROcodone-acetaminophen, menthol-cetylpyridinium **OR** phenol, metoCLOPramide **OR** metoCLOPramide (REGLAN) injection, ondansetron **OR** ondansetron (ZOFRAN) IV, polyethylene glycol  Current Outpatient Medications  Medication Instructions   aspirin 81 mg, Oral, Daily   aspirin 81 mg, Oral, 2 times daily   atorvastatin (LIPITOR) 80 mg, Oral, Daily   baclofen (LIORESAL) 10 mg, Oral, 3 times daily   chlorthalidone (HYGROTON) 25 mg, Oral, Daily   diclofenac (VOLTAREN) 75 mg, Oral, 2 times daily   ergocalciferol (VITAMIN D2) 50,000 Units, Oral, Every Fri   HYDROcodone-acetaminophen (NORCO/VICODIN) 5-325 MG tablet 1-2 tablets, Oral, Every 6 hours PRN   Jardiance 25 mg, Oral,  Daily   levocetirizine (XYZAL) 5 mg, Oral, Every evening   metFORMIN (GLUCOPHAGE) 1,000 mg, Oral, 2 times daily with meals   mirabegron ER (MYRBETRIQ) 50 mg, Oral, Daily   oxybutynin (DITROPAN XL) 15 mg, Oral, Daily at bedtime   potassium chloride SA (KLOR-CON M) 20 MEQ tablet 20 mEq, Oral, 2 times daily   pregabalin  (LYRICA) 100 mg, Oral, 3 times daily   Rebif 44 mcg, Subcutaneous, Every M-W-F   traMADol (ULTRAM) 50-100 mg, Oral, Every 6 hours PRN    Diet Orders (From admission, onward)     Start     Ordered   12/28/22 1429  Diet Carb Modified Fluid consistency: Thin; Room service appropriate? Yes  Diet effective now       Question Answer Comment  Calorie Level Medium 1600-2000   Fluid consistency: Thin   Room service appropriate? Yes      12/28/22 1428            DVT prophylaxis: SCDs Start: 12/28/22 1429   Lab Results  Component Value Date   PLT 255 01/02/2023      Code Status: Full Code  Family Communication: daughter at bedside   Status is: Inpatient  Remains inpatient appropriate because: severity of illness  Level of care: Med-Surg  Objective: Vitals:   01/01/23 1957 01/02/23 0616 01/02/23 0733 01/02/23 0947  BP: 100/60 118/66 112/64 112/66  Pulse: 78 72 73 73  Resp: '18 17 20   '$ Temp: 98.4 F (36.9 C) 98 F (36.7 C) 99 F (37.2 C)   TempSrc: Oral Oral Oral   SpO2: 98% 100% 91%   Weight:      Height:        Intake/Output Summary (Last 24 hours) at 01/02/2023 1450 Last data filed at 01/01/2023 2130 Gross per 24 hour  Intake 360 ml  Output --  Net 360 ml    Wt Readings from Last 3 Encounters:  12/28/22 97.1 kg  12/22/22 96.6 kg  11/22/22 98 kg    Examination:  Constitutional: NAD Eyes: lids and conjunctivae normal, no scleral icterus ENMT: mmm Neck: normal, supple Respiratory: clear to auscultation bilaterally, no wheezing, no crackles. Normal respiratory effort.  Cardiovascular: Regular rate and rhythm, no murmurs / rubs / gallops. No LE edema. Abdomen: soft, no distention, no tenderness.  Data Reviewed: I have independently reviewed following labs and imaging studies   CBC Recent Labs  Lab 12/29/22 0306 12/30/22 0334 12/31/22 0806 01/02/23 0252  WBC 10.7* 10.7* 9.1 9.4  HGB 11.9* 11.5* 12.8 12.5  HCT 35.7* 35.5* 38.0 37.3  PLT 231 208  211 255  MCV 88.4 90.3 87.6 87.8  MCH 29.5 29.3 29.5 29.4  MCHC 33.3 32.4 33.7 33.5  RDW 14.2 14.4 14.1 14.1  LYMPHSABS  --   --  2.0  --   MONOABS  --   --  1.0  --   EOSABS  --   --  0.1  --   BASOSABS  --   --  0.0  --      Recent Labs  Lab 12/28/22 0824 12/29/22 0306 12/30/22 0334 12/31/22 0806 12/31/22 1001 01/02/23 0252  NA 138 136 132* 139  --  136  K 2.9* 3.7 3.0* 3.7  --  3.7  CL 97* 98 94* 97*  --  99  CO2 '29 24 28 23  '$ --  26  GLUCOSE 125* 125* 112* 98  --  132*  BUN '18 19 13 16  '$ --  22  CREATININE 1.00 0.92 0.81 0.88  --  0.87  CALCIUM 9.2 8.5* 8.4* 8.8*  --  9.3  AST 34  --   --  37  --  33  ALT 39  --   --  23  --  27  ALKPHOS 78  --   --  55  --  56  BILITOT 0.6  --   --  0.6  --  0.4  ALBUMIN 3.6  --   --  2.9*  --  2.9*  MG  --   --   --   --   --  1.7  LATICACIDVEN  --   --   --   --  2.2*  --      ------------------------------------------------------------------------------------------------------------------ No results for input(s): "CHOL", "HDL", "LDLCALC", "TRIG", "CHOLHDL", "LDLDIRECT" in the last 72 hours.  Lab Results  Component Value Date   HGBA1C 7.2 (H) 12/22/2022   ------------------------------------------------------------------------------------------------------------------ No results for input(s): "TSH", "T4TOTAL", "T3FREE", "THYROIDAB" in the last 72 hours.  Invalid input(s): "FREET3"  Cardiac Enzymes No results for input(s): "CKMB", "TROPONINI", "MYOGLOBIN" in the last 168 hours.  Invalid input(s): "CK" ------------------------------------------------------------------------------------------------------------------ No results found for: "BNP"  CBG: Recent Labs  Lab 01/01/23 0945 01/01/23 1159 01/01/23 1629 01/01/23 1953 01/02/23 0737  GLUCAP 145* 137* 149* 173* 129*     No results found for this or any previous visit (from the past 240 hour(s)).    Radiology Studies: No results found.   Marzetta Board,  MD, PhD [facility]  Between 7 am - 7 pm I am available, please contact me via Amion (for emergencies) or Securechat (non urgent messages)  Between 7 pm - 7 am I am not available, please contact night coverage MD/APP via Amion

## 2023-01-03 DIAGNOSIS — M1611 Unilateral primary osteoarthritis, right hip: Secondary | ICD-10-CM | POA: Diagnosis not present

## 2023-01-03 LAB — GLUCOSE, CAPILLARY
Glucose-Capillary: 145 mg/dL — ABNORMAL HIGH (ref 70–99)
Glucose-Capillary: 140 mg/dL — ABNORMAL HIGH (ref 70–99)

## 2023-01-03 MED ORDER — METOPROLOL TARTRATE 37.5 MG PO TABS: 37.5000 mg | ORAL_TABLET | Freq: Two times a day (BID) | ORAL | Status: DC

## 2023-01-03 NOTE — Progress Notes (Signed)
PROGRESS NOTE  Gretta Samons TZG:[id number] DOB: 06/22/1956 DOA: 12/28/2022 PCP: Patient, No Pcp Per   LOS: 4 days   Brief Narrative / Interim history: 67 y.o. female with past medical history of DM, HTN, MS, and OSA who presented on 1/2 for hip replacement.  She was admitted to the orthopedic surgery, was found to have significant sinus tachycardia and the hospitalist team was consulted.  She is asymptomatic with this  Subjective / 24h Interval events: Doing well, no chest pain, no palpitations. Assesement and Plan: Principal Problem:   Unilateral primary osteoarthritis, right hip Active Problems:   Sinus tachycardia   Multiple sclerosis (HCC)   Urinary dysfunction   Status post total replacement of right hip   Diabetes mellitus without complication (HCC)   Hypertension   Dyslipidemia   OSA (obstructive sleep apnea)   Principal problem Sinus tachycardia - possibly in the postop setting.  She is asymptomatic with this. 2D echo was done on 1/5 shows an EF of 40-45% with anteroseptal/anterior/inferior septal wall akinesis.  Dr. Lorin Mercy discussed with Dr. Phineas Inches with cardiology, and given proximity to surgery and no active chest pain or ACS type symptoms, no intervention is planned in acute setting and cardiology will follow as an outpatient.  Continue daily aspirin, statin, beta-blockers.  Telemetry reviewed this morning shows 7 rhythm changes from sinus in the 70s to slow SVT into the 120s, 130s, sustained, then converting back to sinus rhythm.  Case discussed with Dr. Quentin Ore with cardiology, he agrees with metoprolol.  It does not look like a flutter. Stable on metoprolol  Active problems Right total hip replacement - elective, per primary team  OSA-has not worn CPAP in years, may be contributing to tachycardia.  Continue CPAP  Hyperlipidemia-continue statin  Essential hypertension-continue home medications  DM2-recent A1c 7.2.  Cover with sliding scale  Urinary  dysfunction-continue home medications  MS-neurology follow-up as an outpatient  Needs SNF, discharge timing per primary, from medicine standpoint could go at any time  Scheduled Meds:  aspirin  81 mg Oral BID   atorvastatin  80 mg Oral Daily   baclofen  10 mg Oral TID   docusate sodium  100 mg Oral BID   insulin aspart  0-15 Units Subcutaneous TID WC   interferon beta-1a  44 mcg Subcutaneous Once   interferon beta-1a  44 mcg Subcutaneous Once per day on Mon Wed Fri   metoprolol tartrate  37.5 mg Oral BID   mirabegron ER  50 mg Oral Daily   pantoprazole  40 mg Oral Daily   potassium chloride  40 mEq Oral BID   pregabalin  100 mg Oral TID   Continuous Infusions:  sodium chloride Stopped (12/28/22 2206)   PRN Meds:.acetaminophen, ALPRAZolam, alum & mag hydroxide-simeth, diphenhydrAMINE, HYDROcodone-acetaminophen, menthol-cetylpyridinium **OR** phenol, metoCLOPramide **OR** metoCLOPramide (REGLAN) injection, ondansetron **OR** ondansetron (ZOFRAN) IV, polyethylene glycol  Current Outpatient Medications  Medication Instructions   aspirin 81 mg, Oral, Daily   aspirin 81 mg, Oral, 2 times daily   atorvastatin (LIPITOR) 80 mg, Oral, Daily   baclofen (LIORESAL) 10 mg, Oral, 3 times daily   chlorthalidone (HYGROTON) 25 mg, Oral, Daily   diclofenac (VOLTAREN) 75 mg, Oral, 2 times daily   ergocalciferol (VITAMIN D2) 50,000 Units, Oral, Every Fri   HYDROcodone-acetaminophen (NORCO/VICODIN) 5-325 MG tablet 1-2 tablets, Oral, Every 6 hours PRN   Jardiance 25 mg, Oral, Daily   levocetirizine (XYZAL) 5 mg, Oral, Every evening   metFORMIN (GLUCOPHAGE) 1,000 mg, Oral, 2 times daily with  meals   mirabegron ER (MYRBETRIQ) 50 mg, Oral, Daily   oxybutynin (DITROPAN XL) 15 mg, Oral, Daily at bedtime   potassium chloride SA (KLOR-CON M) 20 MEQ tablet 20 mEq, Oral, 2 times daily   pregabalin (LYRICA) 100 mg, Oral, 3 times daily   Rebif 44 mcg, Subcutaneous, Every M-W-F   traMADol (ULTRAM) 50-100 mg,  Oral, Every 6 hours PRN    Diet Orders (From admission, onward)     Start     Ordered   12/28/22 1429  Diet Carb Modified Fluid consistency: Thin; Room service appropriate? Yes  Diet effective now       Question Answer Comment  Calorie Level Medium 1600-2000   Fluid consistency: Thin   Room service appropriate? Yes      12/28/22 1428            DVT prophylaxis: SCDs Start: 12/28/22 1429   Lab Results  Component Value Date   PLT 255 01/02/2023      Code Status: Full Code  Family Communication: daughter at bedside   Status is: Inpatient  Remains inpatient appropriate because: severity of illness  Level of care: Med-Surg  Objective: Vitals:   01/02/23 0616 01/02/23 0733 01/02/23 0947 01/02/23 2221  BP: 118/66 112/64 112/66 117/78  Pulse: 72 73 73   Resp: '17 20  19  '$ Temp: 98 F (36.7 C) 99 F (37.2 C)  98.3 F (36.8 C)  TempSrc: Oral Oral  Oral  SpO2: 100% 91%  99%  Weight:      Height:        Intake/Output Summary (Last 24 hours) at 01/03/2023 0754 Last data filed at 01/02/2023 2215 Gross per 24 hour  Intake 360 ml  Output --  Net 360 ml    Wt Readings from Last 3 Encounters:  12/28/22 97.1 kg  12/22/22 96.6 kg  11/22/22 98 kg    Examination:  Constitutional: NAD Eyes: lids and conjunctivae normal, no scleral icterus ENMT: mmm Neck: normal, supple Respiratory: clear to auscultation bilaterally, no wheezing, no crackles. Normal respiratory effort.  Cardiovascular: Regular rate and rhythm, no murmurs / rubs / gallops. No LE edema. Abdomen: soft, no distention, no tenderness. Bowel sounds positive.    Data Reviewed: I have independently reviewed following labs and imaging studies   CBC Recent Labs  Lab 12/29/22 0306 12/30/22 0334 12/31/22 0806 01/02/23 0252  WBC 10.7* 10.7* 9.1 9.4  HGB 11.9* 11.5* 12.8 12.5  HCT 35.7* 35.5* 38.0 37.3  PLT 231 208 211 255  MCV 88.4 90.3 87.6 87.8  MCH 29.5 29.3 29.5 29.4  MCHC 33.3 32.4 33.7 33.5   RDW 14.2 14.4 14.1 14.1  LYMPHSABS  --   --  2.0  --   MONOABS  --   --  1.0  --   EOSABS  --   --  0.1  --   BASOSABS  --   --  0.0  --      Recent Labs  Lab 12/28/22 0824 12/29/22 0306 12/30/22 0334 12/31/22 0806 12/31/22 1001 01/02/23 0252  NA 138 136 132* 139  --  136  K 2.9* 3.7 3.0* 3.7  --  3.7  CL 97* 98 94* 97*  --  99  CO2 '29 24 28 23  '$ --  26  GLUCOSE 125* 125* 112* 98  --  132*  BUN '18 19 13 16  '$ --  22  CREATININE 1.00 0.92 0.81 0.88  --  0.87  CALCIUM 9.2 8.5* 8.4* 8.8*  --  9.3  AST 34  --   --  37  --  33  ALT 39  --   --  23  --  27  ALKPHOS 78  --   --  55  --  56  BILITOT 0.6  --   --  0.6  --  0.4  ALBUMIN 3.6  --   --  2.9*  --  2.9*  MG  --   --   --   --   --  1.7  LATICACIDVEN  --   --   --   --  2.2*  --      ------------------------------------------------------------------------------------------------------------------ No results for input(s): "CHOL", "HDL", "LDLCALC", "TRIG", "CHOLHDL", "LDLDIRECT" in the last 72 hours.  Lab Results  Component Value Date   HGBA1C 7.2 (H) 12/22/2022   ------------------------------------------------------------------------------------------------------------------ No results for input(s): "TSH", "T4TOTAL", "T3FREE", "THYROIDAB" in the last 72 hours.  Invalid input(s): "FREET3"  Cardiac Enzymes No results for input(s): "CKMB", "TROPONINI", "MYOGLOBIN" in the last 168 hours.  Invalid input(s): "CK" ------------------------------------------------------------------------------------------------------------------ No results found for: "BNP"  CBG: Recent Labs  Lab 01/01/23 1159 01/01/23 1629 01/01/23 1953 01/02/23 0737 01/02/23 1619  GLUCAP 137* 149* 173* 129* 181*     No results found for this or any previous visit (from the past 240 hour(s)).    Radiology Studies: No results found.   Marzetta Board, MD, PhD [facility]  Between 7 am - 7 pm I am available, please contact me via  Amion (for emergencies) or Securechat (non urgent messages)  Between 7 pm - 7 am I am not available, please contact night coverage MD/APP via Amion

## 2023-01-03 NOTE — Progress Notes (Signed)
Patient ID: Alisha Martinez, female   DOB: July 22, 1956, 67 y.o.   MRN: [id number] The patient continues to progress along well.  She is mobilizing well in the room this morning.  Her vital signs are stable.  Her right operative hip is stable.  She can be discharged to skilled nursing today.

## 2023-01-03 NOTE — TOC Progression Note (Addendum)
Transition of Care [facility]) - Progression Note    Patient Details  Name: Alisha Martinez MRN: [id number] Date of Birth: [birth year]  [facility] of [facility]) CM/SW Contact  Joanne Chars, LCSW Phone Number: 01/03/2023, 9:53 AM  Clinical Narrative:   SNF auth still pending.  CSW received call from daughter Butch Penny, they want to change SNF to [facility].  CSW confirmed with [facility]--they can take, only have semi private room, CSW confirmed with Butch Penny and they want to move forward with [facility].  Facility choice updated with [facility] by phone, auth request remains pending.    1255: auth request still pending in [place].   Expected Discharge Plan: [place] Barriers to Discharge: SNF Pending bed offer  Expected Discharge Plan and Services In-house Referral: Clinical Social Work   Post Acute Care Choice: [place] Living arrangements for the past 2 months: Single Family Home Expected Discharge Date: 01/03/23                                     Social Determinants of Health (SDOH) Interventions SDOH Screenings   Tobacco Use: Low Risk  (12/28/2022)    Readmission Risk Interventions     No data to display

## 2023-01-03 NOTE — Progress Notes (Signed)
Pt on room air and stable. Pt states she has not worn a CPAP in years and refuses. Will continue to monitor

## 2023-01-03 NOTE — Discharge Summary (Signed)
Patient ID: Alisha Martinez MRN: [id number] DOB/AGE: May 19, 1956 67 y.o.  Admit date: 12/28/2022 Discharge date: 01/03/2023  Admission Diagnoses:  Principal Problem:   Unilateral primary osteoarthritis, right hip Active Problems:   Multiple sclerosis (HCC)   Urinary dysfunction   Status post total replacement of right hip   Diabetes mellitus without complication (HCC)   Hypertension   Sinus tachycardia   Dyslipidemia   OSA (obstructive sleep apnea)   Discharge Diagnoses:  Same  Past Medical History:  Diagnosis Date   Arthritis    Asthma    Cancer ([place])    "cancerous cells found on pap smear- removed surgically"   Diabetes mellitus without complication ([place])    Fibromyalgia    Hypertension    MS (multiple sclerosis) ([place])    Sleep apnea     Surgeries: Procedure(s): RIGHT TOTAL HIP ARTHROPLASTY ANTERIOR APPROACH on 12/28/2022   Consultants: Treatment Team:  Tacey Ruiz, MD Caren Griffins, MD  Discharged Condition: Improved  Hospital Course: Alisha Martinez is an 67 y.o. female who was admitted 12/28/2022 for operative treatment ofUnilateral primary osteoarthritis, right hip. Patient has severe unremitting pain that affects sleep, daily activities, and work/hobbies. After pre-op clearance the patient was taken to the operating room on 12/28/2022 and underwent  Procedure(s): RIGHT TOTAL HIP ARTHROPLASTY ANTERIOR APPROACH.    Patient was given perioperative antibiotics:  Anti-infectives (From admission, onward)    Start     Dose/Rate Route Frequency Ordered Stop   12/28/22 1515  ceFAZolin (ANCEF) IVPB 1 g/50 mL premix        1 g 100 mL/hr over 30 Minutes Intravenous Every 6 hours 12/28/22 1428 12/28/22 2235   12/28/22 0745  ceFAZolin (ANCEF) IVPB 2g/100 mL premix        2 g 200 mL/hr over 30 Minutes Intravenous On call to O.R. 12/28/22 5732 12/28/22 0912        Patient was given sequential compression devices, early ambulation, and chemoprophylaxis to prevent  DVT.  Patient benefited maximally from hospital stay and there were no complications.  The patient did have persistent tachycardia after surgery and it seemed to resolve after several days.  She was graciously seen by the hospitalist service who recommended metoprolol.  She will be discharged on that medication as well as her previous home medications.  Recent vital signs: Patient Vitals for the past 24 hrs:  BP Temp Temp src Pulse Resp SpO2  01/03/23 0803 126/87 98 F (36.7 C) Oral 68 18 98 %  01/02/23 2221 117/78 98.3 F (36.8 C) Oral -- 19 99 %  01/02/23 0947 112/66 -- -- 73 -- --     Recent laboratory studies:  Recent Labs    01/02/23 0252  WBC 9.4  HGB 12.5  HCT 37.3  PLT 255  NA 136  K 3.7  CL 99  CO2 26  BUN 22  CREATININE 0.87  GLUCOSE 132*  CALCIUM 9.3     Discharge Medications:   Allergies as of 01/03/2023       Reactions   Compazine [prochlorperazine]    Muscle pain   Lisinopril Swelling        Medication List     TAKE these medications    aspirin 81 MG chewable tablet Chew 1 tablet (81 mg total) by mouth 2 (two) times daily. What changed: when to take this   atorvastatin 80 MG tablet Commonly known as: LIPITOR Take 80 mg by mouth daily.   baclofen 10 MG tablet Commonly known as: LIORESAL  Take 10 mg by mouth 3 (three) times daily.   chlorthalidone 25 MG tablet Commonly known as: HYGROTON Take 25 mg by mouth daily.   diclofenac 75 MG EC tablet Commonly known as: VOLTAREN Take 75 mg by mouth 2 (two) times daily.   ergocalciferol 1.25 MG (50000 UT) capsule Commonly known as: VITAMIN D2 Take 50,000 Units by mouth every Friday.   HYDROcodone-acetaminophen 5-325 MG tablet Commonly known as: NORCO/VICODIN Take 1-2 tablets by mouth every 6 (six) hours as needed for moderate pain.   Jardiance 25 MG Tabs tablet Generic drug: empagliflozin Take 25 mg by mouth daily.   levocetirizine 5 MG tablet Commonly known as: XYZAL Take 5 mg by mouth  every evening.   metFORMIN 1000 MG tablet Commonly known as: GLUCOPHAGE Take 1,000 mg by mouth 2 (two) times daily with a meal.   Metoprolol Tartrate 37.5 MG Tabs Take 37.5 mg by mouth 2 (two) times daily.   mirabegron ER 50 MG Tb24 tablet Commonly known as: Myrbetriq Take 1 tablet (50 mg total) by mouth daily.   oxybutynin 15 MG 24 hr tablet Commonly known as: DITROPAN XL Take 15 mg by mouth at bedtime.   potassium chloride SA 20 MEQ tablet Commonly known as: KLOR-CON M Take 1 tablet (20 mEq total) by mouth 2 (two) times daily.   pregabalin 100 MG capsule Commonly known as: LYRICA Take 100 mg by mouth 3 (three) times daily.   Rebif 44 MCG/0.5ML Sosy injection Generic drug: interferon beta-1a Inject 0.5 mLs (44 mcg total) into the skin every Monday, Wednesday, and Friday.   traMADol 50 MG tablet Commonly known as: ULTRAM Take 1-2 tablets (50-100 mg total) by mouth every 6 (six) hours as needed.               Durable Medical Equipment  (From admission, onward)           Start     Ordered   12/28/22 1429  DME 3 n 1  Once        12/28/22 1428   12/28/22 1429  DME Walker rolling  Once       Question Answer Comment  Walker: With 5 Inch Wheels   Patient needs a walker to treat with the following condition Status post total replacement of right hip      12/28/22 1428            Diagnostic Studies: ECHOCARDIOGRAM COMPLETE  Result Date: 12/31/2022    ECHOCARDIOGRAM REPORT   Patient Name:   Alisha Martinez Date of Exam: 12/31/2022 Medical Rec #:  [id number]   Height:       64.0 in Accession #:    [id number]  Weight:       214.0 lb Date of Birth:  September 19, 1956  BSA:          2.013 m Patient Age:    52 years    BP:           122/96 mmHg Patient Gender: F           HR:           102 bpm. Exam Location:  Inpatient Procedure: 2D Echo, Cardiac Doppler, Color Doppler and Intracardiac            Opacification Agent Indications:    R00.8 Other abnormalities of heart beat   History:        Patient has no prior history of Echocardiogram examinations.  Abnormal ECG; Arrythmias:Tachycardia.  Sonographer:    Roseanna Rainbow RDCS Referring Phys: [id number] JENNIFER YATES  Sonographer Comments: Suboptimal parasternal window, suboptimal subcostal window, suboptimal apical window, Technically difficult study due to poor echo windows and patient is obese. Image acquisition challenging due to patient body habitus. Extremely difficult due to habitus. Waited 30 mins to get patient back in bed from toileting. IMPRESSIONS  1. Akinesis of the anteroseptal/anterior/inferoseptal wall from base to apex. Left ventricular ejection fraction, by estimation, is 40 to 45%. The left ventricle has mildly decreased function. The left ventricle demonstrates global hypokinesis. Left ventricular diastolic parameters are indeterminate.  2. Right ventricular systolic function is normal. The right ventricular size is normal. Tricuspid regurgitation signal is inadequate for assessing PA pressure.  3. No evidence of mitral valve regurgitation.  4. The aortic valve is grossly normal. Aortic valve regurgitation is not visualized.  5. The inferior vena cava is normal in size with greater than 50% respiratory variability, suggesting right atrial pressure of 3 mmHg. Comparison(s): No prior Echocardiogram. Conclusion(s)/Recommendation(s): Has wall motion abnormality, consider ischemic evaluation. FINDINGS  Left Ventricle: Akinesis of the anteroseptal/anterior/inferoseptal wall from base to apex. Left ventricular ejection fraction, by estimation, is 40 to 45%. The left ventricle has mildly decreased function. The left ventricle demonstrates global hypokinesis. Definity contrast agent was given IV to delineate the left ventricular endocardial borders. The left ventricular internal cavity size was normal in size. There is no left ventricular hypertrophy. Abnormal (paradoxical) septal motion, consistent with left bundle branch  block. Left ventricular diastolic parameters are indeterminate. Right Ventricle: The right ventricular size is normal. Right ventricular systolic function is normal. Tricuspid regurgitation signal is inadequate for assessing PA pressure. Left Atrium: Left atrial size was normal in size. Right Atrium: Right atrial size was normal in size. Pericardium: There is no evidence of pericardial effusion. Mitral Valve: No evidence of mitral valve regurgitation. Tricuspid Valve: Tricuspid valve regurgitation is not demonstrated. Aortic Valve: The aortic valve is grossly normal. Aortic valve regurgitation is not visualized. Pulmonic Valve: Pulmonic valve regurgitation is not visualized. Aorta: The aortic root and ascending aorta are structurally normal, with no evidence of dilitation. Venous: The inferior vena cava is normal in size with greater than 50% respiratory variability, suggesting right atrial pressure of 3 mmHg. IAS/Shunts: The interatrial septum was not well visualized.  LEFT VENTRICLE PLAX 2D LVIDd:         4.30 cm      Diastology LVIDs:         3.60 cm      LV e' medial:    4.79 cm/s LV PW:         1.00 cm      LV E/e' medial:  12.5 LV IVS:        1.00 cm      LV e' lateral:   5.11 cm/s LVOT diam:     2.40 cm      LV E/e' lateral: 11.7 LV SV:         58 LV SV Index:   29 LVOT Area:     4.52 cm  LV Volumes (MOD) LV vol d, MOD A2C: 118.0 ml LV vol d, MOD A4C: 114.0 ml LV vol s, MOD A2C: 70.0 ml LV vol s, MOD A4C: 76.7 ml LV SV MOD A2C:     48.0 ml LV SV MOD A4C:     114.0 ml LV SV MOD BP:      43.1 ml RIGHT VENTRICLE RV S prime:  10.20 cm/s TAPSE (M-mode): 1.4 cm LEFT ATRIUM             Index        RIGHT ATRIUM          Index LA diam:        2.60 cm 1.29 cm/m   RA Area:     7.76 cm LA Vol (A2C):   36.2 ml 17.98 ml/m  RA Volume:   12.60 ml 6.26 ml/m LA Vol (A4C):   29.0 ml 14.40 ml/m LA Biplane Vol: 32.2 ml 15.99 ml/m  AORTIC VALVE LVOT Vmax:   92.70 cm/s LVOT Vmean:  58.950 cm/s LVOT VTI:    0.128 m  AORTA  Ao Root diam: 3.30 cm Ao Asc diam:  3.20 cm MITRAL VALVE MV Area (PHT): 10.25 cm   SHUNTS MV Decel Time: 74 msec     Systemic VTI:  0.13 m MV E velocity: 59.70 cm/s  Systemic Diam: 2.40 cm MV A velocity: 83.80 cm/s MV E/A ratio:  0.71 Mary Scientist, physiological signed by Phineas Inches Signature Date/Time: 12/31/2022/3:36:35 PM    Final    DG HIP UNILAT WITH PELVIS 1V RIGHT  Result Date: 12/28/2022 CLINICAL DATA:  Elective surgery.  Anterior right hip arthroplasty. EXAM: DG HIP (WITH OR WITHOUT PELVIS) 1V RIGHT COMPARISON:  Pelvis and right hip radiographs 11/08/2022 FINDINGS: Images were performed intraoperatively without the presence of a radiologist. New total right hip arthroplasty. No hardware complication is seen. Total fluoroscopy images: 3 Total fluoroscopy time: 26 seconds Total dose: Radiation Exposure Index (as provided by the fluoroscopic device): 3.86 mGy air Kerma Please see intraoperative findings for further detail. IMPRESSION: Intraoperative fluoroscopy for total right hip arthroplasty. Electronically Signed   By: Yvonne Kendall M.D.   On: 12/28/2022 12:50   DG Pelvis Portable  Result Date: 12/28/2022 CLINICAL DATA:  Status post right hip replacement. EXAM: PORTABLE PELVIS 1-2 VIEWS COMPARISON:  None Available. FINDINGS: Right hip arthroplasty in expected alignment. No periprosthetic lucency or fracture. Recent postsurgical change includes air and edema in the soft tissues. IMPRESSION: Right hip arthroplasty without immediate postoperative complication. Electronically Signed   By: Keith Rake M.D.   On: 12/28/2022 11:26   DG C-Arm 1-60 Min-No Report  Result Date: 12/28/2022 Fluoroscopy was utilized by the requesting physician.  No radiographic interpretation.    Disposition: Discharge disposition: 03-Skilled [place]     Mcarthur Rossetti, MD Follow up in 2 week(s).   Specialty: Orthopedic Surgery Contact information: [place] [address] [phone]         Janina Mayo, MD Follow up in 2 week(s).   Specialty: Cardiology Contact information: [address] [place] [address] [phone]                  Signed: Mcarthur Rossetti 01/03/2023, 8:08 AM

## 2023-01-03 NOTE — Progress Notes (Signed)
Physical Therapy Treatment Patient Details Name: Alisha Martinez MRN: [id number] DOB: 1956/05/18 Today's Date: 01/03/2023   History of Present Illness Pt is a 67 y.o. F who presents 12/28/2022 s/p R THA. Noted tachycardia with exertion during PT sessions 1/3 and 1/4. Significant PMH: MS, DM, fibromyalgia.    PT Comments    Pt progressing towards physical therapy goals. Complaining of pain throughout, stating she felt a "pop" during heel slides. Pt continues to desire SNF level rehab at d/c which I feel is reasonable given her progress. RN notified that pt requesting pain meds at end of session. Will continue to follow and progress as able per POC.     Recommendations for follow up therapy are one component of a multi-disciplinary discharge planning process, led by the attending physician.  Recommendations may be updated based on patient status, additional functional criteria and insurance authorization.  Follow Up Recommendations  Follow physician's recommendations for discharge plan and follow up therapies Can patient physically be transported by private vehicle: Yes   Assistance Recommended at Discharge Intermittent Supervision/Assistance  Patient can return home with the following A little help with walking and/or transfers;A little help with bathing/dressing/bathroom;Assist for transportation;Help with stairs or ramp for entrance   Equipment Recommendations  BSC/3in1    Recommendations for Other Services       Precautions / Restrictions Precautions Precautions: Fall Precaution Comments: monitor HR/O2 Restrictions Weight Bearing Restrictions: No RLE Weight Bearing: Weight bearing as tolerated     Mobility  Bed Mobility Overal bed mobility: Needs Assistance Bed Mobility: Supine to Sit     Supine to sit: Min assist, HOB elevated     General bed mobility comments: Assist to advance LE's toward EOB. Pt required increased time and heavy use of rails to transition to EOB.     Transfers Overall transfer level: Needs assistance Equipment used: Rolling walker (2 wheels) Transfers: Sit to/from Stand Sit to Stand: Min guard           General transfer comment: Increased time and effort required for power up to full stand. VC's for hand placement on seated surface for safety.    Ambulation/Gait Ambulation/Gait assistance: Min assist Gait Distance (Feet): 200 Feet Assistive device: Rolling walker (2 wheels) Gait Pattern/deviations: Step-to pattern, Decreased step length - right, Shuffle, Antalgic, Trunk flexed, Step-through pattern Gait velocity: decreased Gait velocity interpretation: <1.31 ft/sec, indicative of household ambulator   General Gait Details: VC's for closer walker proximity, improved posture and general sequencing with RW as well. Pt antalgic due to pain and with short, choppy steps.   Stairs             Wheelchair Mobility    Modified Rankin (Stroke Patients Only)       Balance Overall balance assessment: Needs assistance Sitting-balance support: Feet supported Sitting balance-Leahy Scale: Good Sitting balance - Comments: supervision for sitting with 1 UE support   Standing balance support: Bilateral upper extremity supported Standing balance-Leahy Scale: Poor Standing balance comment: Needs min assist and support from RW.                            Cognition Arousal/Alertness: Awake/alert Behavior During Therapy: WFL for tasks assessed/performed Overall Cognitive Status: Within Functional Limits for tasks assessed  Exercises Total Joint Exercises Ankle Circles/Pumps: 10 reps Quad Sets: 10 reps Gluteal Sets: 10 reps Short Arc Quad: 10 reps Heel Slides: 10 reps Hip ABduction/ADduction: 10 reps    General Comments        Pertinent Vitals/Pain Pain Assessment Pain Assessment: Faces Faces Pain Scale: Hurts even more Pain Location: R hip/  RLE with movement; she reports minimal to no resting pain Pain Descriptors / Indicators: Discomfort, Grimacing, Guarding, Sharp Pain Intervention(s): Limited activity within patient's tolerance, Monitored during session, Repositioned    Home Living                          Prior Function            PT Goals (current goals can now be found in the care plan section) Acute Rehab PT Goals Patient Stated Goal: wants to go to rehab PT Goal Formulation: With patient Time For Goal Achievement: 01/11/23 Potential to Achieve Goals: Good Progress towards PT goals: Progressing toward goals    Frequency    7X/week      PT Plan Current plan remains appropriate    Co-evaluation              AM-PAC PT "6 Clicks" Mobility   Outcome Measure  Help needed turning from your back to your side while in a flat bed without using bedrails?: A Little Help needed moving from lying on your back to sitting on the side of a flat bed without using bedrails?: A Little Help needed moving to and from a bed to a chair (including a wheelchair)?: A Little Help needed standing up from a chair using your arms (e.g., wheelchair or bedside chair)?: A Little Help needed to walk in hospital room?: A Little Help needed climbing 3-5 steps with a railing? : A Little 6 Click Score: 18    End of Session Equipment Utilized During Treatment: Gait belt Activity Tolerance: Patient limited by fatigue;Patient limited by pain Patient left: in bed;with call bell/phone within reach;with nursing/sitter in room Nurse Communication: Mobility status;Precautions;Other (comment) PT Visit Diagnosis: Unsteadiness on feet (R26.81);Difficulty in walking, not elsewhere classified (R26.2);Pain Pain - Right/Left: Right Pain - part of body: Hip     Time: 3716-9678 PT Time Calculation (min) (ACUTE ONLY): 36 min  Charges:  $Gait Training: 8-22 mins $Therapeutic Exercise: 8-22 mins                     Alisha Martinez, PT, DPT Acute Rehabilitation Services Secure Chat Preferred Office: [phone]    Thelma Comp 01/03/2023, 4:32 PM

## 2023-01-03 NOTE — Discharge Summary (Signed)
Patient ID: Alisha Martinez MRN: [id number] DOB/AGE: Aug 25, 1956 67 y.o.  Admit date: 12/28/2022 Discharge date: 01/03/2023  Admission Diagnoses:  Principal Problem:   Unilateral primary osteoarthritis, right hip Active Problems:   Multiple sclerosis (HCC)   Urinary dysfunction   Status post total replacement of right hip   Diabetes mellitus without complication (HCC)   Hypertension   Sinus tachycardia   Dyslipidemia   OSA (obstructive sleep apnea)   Discharge Diagnoses:  Same  Past Medical History:  Diagnosis Date   Arthritis    Asthma    Cancer ([place])    "cancerous cells found on pap smear- removed surgically"   Diabetes mellitus without complication ([place])    Fibromyalgia    Hypertension    MS (multiple sclerosis) ([place])    Sleep apnea     Surgeries: Procedure(s): RIGHT TOTAL HIP ARTHROPLASTY ANTERIOR APPROACH on 12/28/2022   Consultants: Treatment Team:  Tacey Ruiz, MD Caren Griffins, MD  Discharged Condition: Improved  Hospital Course: Alisha Martinez is an 67 y.o. female who was admitted 12/28/2022 for operative treatment ofUnilateral primary osteoarthritis, right hip. Patient has severe unremitting pain that affects sleep, daily activities, and work/hobbies. After pre-op clearance the patient was taken to the operating room on 12/28/2022 and underwent  Procedure(s): RIGHT TOTAL HIP ARTHROPLASTY ANTERIOR APPROACH.    Patient was given perioperative antibiotics:  Anti-infectives (From admission, onward)    Start     Dose/Rate Route Frequency Ordered Stop   12/28/22 1515  ceFAZolin (ANCEF) IVPB 1 g/50 mL premix        1 g 100 mL/hr over 30 Minutes Intravenous Every 6 hours 12/28/22 1428 12/28/22 2235   12/28/22 0745  ceFAZolin (ANCEF) IVPB 2g/100 mL premix        2 g 200 mL/hr over 30 Minutes Intravenous On call to O.R. 12/28/22 8101 12/28/22 0912        Patient was given sequential compression devices, early ambulation, and chemoprophylaxis to prevent  DVT.  Patient benefited maximally from hospital stay and there were no complications.  She did have persistent tachycardia after surgery for several days that did require an internal medicine consult.  No intervention was needed and she normalized by the day of discharge.  She was mobilizing well and was ready for discharge to skilled nursing.  Her right operative hip was stable and her vitals were stable.  Recent vital signs: Patient Vitals for the past 24 hrs:  BP Temp Temp src Pulse Resp SpO2  01/02/23 2221 117/78 98.3 F (36.8 C) Oral -- 19 99 %  01/02/23 0947 112/66 -- -- 73 -- --     Recent laboratory studies:  Recent Labs    12/31/22 0806 01/02/23 0252  WBC 9.1 9.4  HGB 12.8 12.5  HCT 38.0 37.3  PLT 211 255  NA 139 136  K 3.7 3.7  CL 97* 99  CO2 23 26  BUN 16 22  CREATININE 0.88 0.87  GLUCOSE 98 132*  CALCIUM 8.8* 9.3     Discharge Medications:   Allergies as of 01/03/2023       Reactions   Compazine [prochlorperazine]    Muscle pain   Lisinopril Swelling        Medication List     TAKE these medications    aspirin 81 MG chewable tablet Chew 1 tablet (81 mg total) by mouth 2 (two) times daily. What changed: when to take this   atorvastatin 80 MG tablet Commonly known as: LIPITOR Take 80 mg  by mouth daily.   baclofen 10 MG tablet Commonly known as: LIORESAL Take 10 mg by mouth 3 (three) times daily.   chlorthalidone 25 MG tablet Commonly known as: HYGROTON Take 25 mg by mouth daily.   diclofenac 75 MG EC tablet Commonly known as: VOLTAREN Take 75 mg by mouth 2 (two) times daily.   ergocalciferol 1.25 MG (50000 UT) capsule Commonly known as: VITAMIN D2 Take 50,000 Units by mouth every Friday.   HYDROcodone-acetaminophen 5-325 MG tablet Commonly known as: NORCO/VICODIN Take 1-2 tablets by mouth every 6 (six) hours as needed for moderate pain.   Jardiance 25 MG Tabs tablet Generic drug: empagliflozin Take 25 mg by mouth daily.    levocetirizine 5 MG tablet Commonly known as: XYZAL Take 5 mg by mouth every evening.   metFORMIN 1000 MG tablet Commonly known as: GLUCOPHAGE Take 1,000 mg by mouth 2 (two) times daily with a meal.   mirabegron ER 50 MG Tb24 tablet Commonly known as: Myrbetriq Take 1 tablet (50 mg total) by mouth daily.   oxybutynin 15 MG 24 hr tablet Commonly known as: DITROPAN XL Take 15 mg by mouth at bedtime.   potassium chloride SA 20 MEQ tablet Commonly known as: KLOR-CON M Take 1 tablet (20 mEq total) by mouth 2 (two) times daily.   pregabalin 100 MG capsule Commonly known as: LYRICA Take 100 mg by mouth 3 (three) times daily.   Rebif 44 MCG/0.5ML Sosy injection Generic drug: interferon beta-1a Inject 0.5 mLs (44 mcg total) into the skin every Monday, Wednesday, and Friday.   traMADol 50 MG tablet Commonly known as: ULTRAM Take 1-2 tablets (50-100 mg total) by mouth every 6 (six) hours as needed.               Durable Medical Equipment  (From admission, onward)           Start     Ordered   12/28/22 1429  DME 3 n 1  Once        12/28/22 1428   12/28/22 1429  DME Walker rolling  Once       Question Answer Comment  Walker: With 5 Inch Wheels   Patient needs a walker to treat with the following condition Status post total replacement of right hip      12/28/22 1428            Diagnostic Studies: ECHOCARDIOGRAM COMPLETE  Result Date: 12/31/2022    ECHOCARDIOGRAM REPORT   Patient Name:   Alisha Martinez Date of Exam: 12/31/2022 Medical Rec #:  [id number]   Height:       64.0 in Accession #:    [id number]  Weight:       214.0 lb Date of Birth:  04-16-1956  BSA:          2.013 m Patient Age:    32 years    BP:           122/96 mmHg Patient Gender: F           HR:           102 bpm. Exam Location:  Inpatient Procedure: 2D Echo, Cardiac Doppler, Color Doppler and Intracardiac            Opacification Agent Indications:    R00.8 Other abnormalities of heart beat  History:         Patient has no prior history of Echocardiogram examinations.  Abnormal ECG; Arrythmias:Tachycardia.  Sonographer:    Roseanna Rainbow RDCS Referring Phys: [id number] JENNIFER YATES  Sonographer Comments: Suboptimal parasternal window, suboptimal subcostal window, suboptimal apical window, Technically difficult study due to poor echo windows and patient is obese. Image acquisition challenging due to patient body habitus. Extremely difficult due to habitus. Waited 30 mins to get patient back in bed from toileting. IMPRESSIONS  1. Akinesis of the anteroseptal/anterior/inferoseptal wall from base to apex. Left ventricular ejection fraction, by estimation, is 40 to 45%. The left ventricle has mildly decreased function. The left ventricle demonstrates global hypokinesis. Left ventricular diastolic parameters are indeterminate.  2. Right ventricular systolic function is normal. The right ventricular size is normal. Tricuspid regurgitation signal is inadequate for assessing PA pressure.  3. No evidence of mitral valve regurgitation.  4. The aortic valve is grossly normal. Aortic valve regurgitation is not visualized.  5. The inferior vena cava is normal in size with greater than 50% respiratory variability, suggesting right atrial pressure of 3 mmHg. Comparison(s): No prior Echocardiogram. Conclusion(s)/Recommendation(s): Has wall motion abnormality, consider ischemic evaluation. FINDINGS  Left Ventricle: Akinesis of the anteroseptal/anterior/inferoseptal wall from base to apex. Left ventricular ejection fraction, by estimation, is 40 to 45%. The left ventricle has mildly decreased function. The left ventricle demonstrates global hypokinesis. Definity contrast agent was given IV to delineate the left ventricular endocardial borders. The left ventricular internal cavity size was normal in size. There is no left ventricular hypertrophy. Abnormal (paradoxical) septal motion, consistent with left bundle branch block. Left  ventricular diastolic parameters are indeterminate. Right Ventricle: The right ventricular size is normal. Right ventricular systolic function is normal. Tricuspid regurgitation signal is inadequate for assessing PA pressure. Left Atrium: Left atrial size was normal in size. Right Atrium: Right atrial size was normal in size. Pericardium: There is no evidence of pericardial effusion. Mitral Valve: No evidence of mitral valve regurgitation. Tricuspid Valve: Tricuspid valve regurgitation is not demonstrated. Aortic Valve: The aortic valve is grossly normal. Aortic valve regurgitation is not visualized. Pulmonic Valve: Pulmonic valve regurgitation is not visualized. Aorta: The aortic root and ascending aorta are structurally normal, with no evidence of dilitation. Venous: The inferior vena cava is normal in size with greater than 50% respiratory variability, suggesting right atrial pressure of 3 mmHg. IAS/Shunts: The interatrial septum was not well visualized.  LEFT VENTRICLE PLAX 2D LVIDd:         4.30 cm      Diastology LVIDs:         3.60 cm      LV e' medial:    4.79 cm/s LV PW:         1.00 cm      LV E/e' medial:  12.5 LV IVS:        1.00 cm      LV e' lateral:   5.11 cm/s LVOT diam:     2.40 cm      LV E/e' lateral: 11.7 LV SV:         58 LV SV Index:   29 LVOT Area:     4.52 cm  LV Volumes (MOD) LV vol d, MOD A2C: 118.0 ml LV vol d, MOD A4C: 114.0 ml LV vol s, MOD A2C: 70.0 ml LV vol s, MOD A4C: 76.7 ml LV SV MOD A2C:     48.0 ml LV SV MOD A4C:     114.0 ml LV SV MOD BP:      43.1 ml RIGHT VENTRICLE RV S prime:  10.20 cm/s TAPSE (M-mode): 1.4 cm LEFT ATRIUM             Index        RIGHT ATRIUM          Index LA diam:        2.60 cm 1.29 cm/m   RA Area:     7.76 cm LA Vol (A2C):   36.2 ml 17.98 ml/m  RA Volume:   12.60 ml 6.26 ml/m LA Vol (A4C):   29.0 ml 14.40 ml/m LA Biplane Vol: 32.2 ml 15.99 ml/m  AORTIC VALVE LVOT Vmax:   92.70 cm/s LVOT Vmean:  58.950 cm/s LVOT VTI:    0.128 m  AORTA Ao Root  diam: 3.30 cm Ao Asc diam:  3.20 cm MITRAL VALVE MV Area (PHT): 10.25 cm   SHUNTS MV Decel Time: 74 msec     Systemic VTI:  0.13 m MV E velocity: 59.70 cm/s  Systemic Diam: 2.40 cm MV A velocity: 83.80 cm/s MV E/A ratio:  0.71 Mary Scientist, physiological signed by Phineas Inches Signature Date/Time: 12/31/2022/3:36:35 PM    Final    DG HIP UNILAT WITH PELVIS 1V RIGHT  Result Date: 12/28/2022 CLINICAL DATA:  Elective surgery.  Anterior right hip arthroplasty. EXAM: DG HIP (WITH OR WITHOUT PELVIS) 1V RIGHT COMPARISON:  Pelvis and right hip radiographs 11/08/2022 FINDINGS: Images were performed intraoperatively without the presence of a radiologist. New total right hip arthroplasty. No hardware complication is seen. Total fluoroscopy images: 3 Total fluoroscopy time: 26 seconds Total dose: Radiation Exposure Index (as provided by the fluoroscopic device): 3.86 mGy air Kerma Please see intraoperative findings for further detail. IMPRESSION: Intraoperative fluoroscopy for total right hip arthroplasty. Electronically Signed   By: Yvonne Kendall M.D.   On: 12/28/2022 12:50   DG Pelvis Portable  Result Date: 12/28/2022 CLINICAL DATA:  Status post right hip replacement. EXAM: PORTABLE PELVIS 1-2 VIEWS COMPARISON:  None Available. FINDINGS: Right hip arthroplasty in expected alignment. No periprosthetic lucency or fracture. Recent postsurgical change includes air and edema in the soft tissues. IMPRESSION: Right hip arthroplasty without immediate postoperative complication. Electronically Signed   By: Keith Rake M.D.   On: 12/28/2022 11:26   DG C-Arm 1-60 Min-No Report  Result Date: 12/28/2022 Fluoroscopy was utilized by the requesting physician.  No radiographic interpretation.    Disposition: Discharge disposition: 03-Skilled [place]     Mcarthur Rossetti, MD Follow up in 2 week(s).   Specialty: Orthopedic Surgery Contact information: [address] [place] [address] [phone]                  Signed: Mcarthur Rossetti 01/03/2023, 7:50 AM

## 2023-01-03 NOTE — Care Management Important Message (Signed)
Important Message  Patient Details  Name: Alisha Martinez MRN: [id number] Date of Birth: 1956-07-03   Medicare Important Message Given:  Yes     Brittinee Risk Montine Circle 01/03/2023, 3:39 PM

## 2023-01-04 ENCOUNTER — Inpatient Hospital Stay (HOSPITAL_COMMUNITY): Payer: Medicare PPO

## 2023-01-04 DIAGNOSIS — M1611 Unilateral primary osteoarthritis, right hip: Secondary | ICD-10-CM | POA: Diagnosis not present

## 2023-01-04 LAB — GLUCOSE, CAPILLARY
Glucose-Capillary: 223 mg/dL — ABNORMAL HIGH (ref 70–99)
Glucose-Capillary: 142 mg/dL — ABNORMAL HIGH (ref 70–99)
Glucose-Capillary: 160 mg/dL — ABNORMAL HIGH (ref 70–99)

## 2023-01-04 NOTE — Progress Notes (Signed)
PROGRESS NOTE  Alisha Martinez GGY:[id number] DOB: [birth year] DOA: 12/28/2022 PCP: Patient, No Pcp Per   LOS: 5 days   Brief Narrative / Interim history: 67 y.o. female with past medical history of DM, HTN, MS, and OSA who presented on 1/2 for hip replacement.  She was admitted to the orthopedic surgery, was found to have significant sinus tachycardia and the hospitalist team was consulted.  She is asymptomatic with this  Subjective / 24h Interval events: No chest pains, no palpitations.  Concerned that yesterday while working with PT she heard a "pop" in her hip  Assesement and Plan: Principal Problem:   Unilateral primary osteoarthritis, right hip Active Problems:   Sinus tachycardia   Multiple sclerosis (HCC)   Urinary dysfunction   Status post total replacement of right hip   Diabetes mellitus without complication (HCC)   Hypertension   Dyslipidemia   OSA (obstructive sleep apnea)   Principal problem Sinus tachycardia - possibly in the postop setting.  She is asymptomatic with this. 2D echo was done on 1/5 shows an EF of 40-45% with anteroseptal/anterior/inferior septal wall akinesis.  Dr. Lorin Mercy discussed with Dr. Phineas Inches with cardiology, and given proximity to surgery and no active chest pain or ACS type symptoms, no intervention is planned in acute setting and cardiology will follow as an outpatient.  Continue daily aspirin, statin, beta-blockers.  There was concern about sustained SVT over the weekend, case was discussed with Dr. Quentin Ore, who recommends metoprolol.  She is stable now, continue metoprolol, SVT has resolved, okay for discharge from medicine standpoint  Active problems Right total hip replacement - elective, per primary team OSA-has not worn CPAP in years, may be contributing to tachycardia.  Continue CPAP Hyperlipidemia-continue statin Essential hypertension-continue home medications DM2-recent A1c 7.2.  Cover with sliding scale Urinary dysfunction-continue  home medications MS-neurology follow-up as an outpatient  Needs SNF, discharge timing per primary, from medicine standpoint could go at any time  Scheduled Meds:  aspirin  81 mg Oral BID   atorvastatin  80 mg Oral Daily   baclofen  10 mg Oral TID   docusate sodium  100 mg Oral BID   insulin aspart  0-15 Units Subcutaneous TID WC   interferon beta-1a  44 mcg Subcutaneous Once   interferon beta-1a  44 mcg Subcutaneous Once per day on Mon Wed Fri   metoprolol tartrate  37.5 mg Oral BID   mirabegron ER  50 mg Oral Daily   pantoprazole  40 mg Oral Daily   potassium chloride  40 mEq Oral BID   pregabalin  100 mg Oral TID   Continuous Infusions:  sodium chloride Stopped (12/28/22 2206)   PRN Meds:.acetaminophen, ALPRAZolam, alum & mag hydroxide-simeth, diphenhydrAMINE, HYDROcodone-acetaminophen, menthol-cetylpyridinium **OR** phenol, metoCLOPramide **OR** metoCLOPramide (REGLAN) injection, ondansetron **OR** ondansetron (ZOFRAN) IV, polyethylene glycol  Current Outpatient Medications  Medication Instructions   aspirin 81 mg, Oral, Daily   aspirin 81 mg, Oral, 2 times daily   atorvastatin (LIPITOR) 80 mg, Oral, Daily   baclofen (LIORESAL) 10 mg, Oral, 3 times daily   chlorthalidone (HYGROTON) 25 mg, Oral, Daily   diclofenac (VOLTAREN) 75 mg, Oral, 2 times daily   ergocalciferol (VITAMIN D2) 50,000 Units, Oral, Every Fri   HYDROcodone-acetaminophen (NORCO/VICODIN) 5-325 MG tablet 1-2 tablets, Oral, Every 6 hours PRN   Jardiance 25 mg, Oral, Daily   levocetirizine (XYZAL) 5 mg, Oral, Every evening   metFORMIN (GLUCOPHAGE) 1,000 mg, Oral, 2 times daily with meals   Metoprolol Tartrate 37.5 mg, Oral,  2 times daily   mirabegron ER (MYRBETRIQ) 50 mg, Oral, Daily   oxybutynin (DITROPAN XL) 15 mg, Oral, Daily at bedtime   potassium chloride SA (KLOR-CON M) 20 MEQ tablet 20 mEq, Oral, 2 times daily   pregabalin (LYRICA) 100 mg, Oral, 3 times daily   Rebif 44 mcg, Subcutaneous, Every M-W-F    traMADol (ULTRAM) 50-100 mg, Oral, Every 6 hours PRN    Diet Orders (From admission, onward)     Start     Ordered   12/28/22 1429  Diet Carb Modified Fluid consistency: Thin; Room service appropriate? Yes  Diet effective now       Question Answer Comment  Calorie Level Medium 1600-2000   Fluid consistency: Thin   Room service appropriate? Yes      12/28/22 1428            DVT prophylaxis: SCDs Start: 12/28/22 1429   Lab Results  Component Value Date   PLT 255 01/02/2023      Code Status: Full Code  Family Communication: daughter at bedside   Status is: Inpatient  Remains inpatient appropriate because: severity of illness  Level of care: Med-Surg  Objective: Vitals:   01/03/23 0803 01/03/23 1616 01/03/23 2221 01/04/23 0735  BP: 126/87 118/74 111/71   Pulse: 68 69  74  Resp: '18 19 20   '$ Temp: 98 F (36.7 C) 97.6 F (36.4 C) 98.1 F (36.7 C) 97.8 F (36.6 C)  TempSrc: Oral Oral Oral Oral  SpO2: 98% 99% 99% 97%  Weight:      Height:        Intake/Output Summary (Last 24 hours) at 01/04/2023 1110 Last data filed at 01/03/2023 2207 Gross per 24 hour  Intake 480 ml  Output --  Net 480 ml    Wt Readings from Last 3 Encounters:  12/28/22 97.1 kg  12/22/22 96.6 kg  11/22/22 98 kg    Examination:  Constitutional: NAD Eyes: lids and conjunctivae normal, no scleral icterus ENMT: mmm Neck: normal, supple Respiratory: clear to auscultation bilaterally, no wheezing, no crackles. Normal respiratory effort.  Cardiovascular: Regular rate and rhythm, no murmurs / rubs / gallops. No LE edema. Abdomen: soft, no distention, no tenderness. Bowel sounds positive.  Skin: no rashes Neurologic: no focal deficits, equal strength   Data Reviewed: I have independently reviewed following labs and imaging studies   CBC Recent Labs  Lab 12/29/22 0306 12/30/22 0334 12/31/22 0806 01/02/23 0252  WBC 10.7* 10.7* 9.1 9.4  HGB 11.9* 11.5* 12.8 12.5  HCT 35.7* 35.5*  38.0 37.3  PLT 231 208 211 255  MCV 88.4 90.3 87.6 87.8  MCH 29.5 29.3 29.5 29.4  MCHC 33.3 32.4 33.7 33.5  RDW 14.2 14.4 14.1 14.1  LYMPHSABS  --   --  2.0  --   MONOABS  --   --  1.0  --   EOSABS  --   --  0.1  --   BASOSABS  --   --  0.0  --      Recent Labs  Lab 12/29/22 0306 12/30/22 0334 12/31/22 0806 12/31/22 1001 01/02/23 0252  NA 136 132* 139  --  136  K 3.7 3.0* 3.7  --  3.7  CL 98 94* 97*  --  99  CO2 '24 28 23  '$ --  26  GLUCOSE 125* 112* 98  --  132*  BUN '19 13 16  '$ --  22  CREATININE 0.92 0.81 0.88  --  0.87  CALCIUM 8.5* 8.4* 8.8*  --  9.3  AST  --   --  37  --  33  ALT  --   --  23  --  27  ALKPHOS  --   --  55  --  56  BILITOT  --   --  0.6  --  0.4  ALBUMIN  --   --  2.9*  --  2.9*  MG  --   --   --   --  1.7  LATICACIDVEN  --   --   --  2.2*  --      ------------------------------------------------------------------------------------------------------------------ No results for input(s): "CHOL", "HDL", "LDLCALC", "TRIG", "CHOLHDL", "LDLDIRECT" in the last 72 hours.  Lab Results  Component Value Date   HGBA1C 7.2 (H) 12/22/2022   ------------------------------------------------------------------------------------------------------------------ No results for input(s): "TSH", "T4TOTAL", "T3FREE", "THYROIDAB" in the last 72 hours.  Invalid input(s): "FREET3"  Cardiac Enzymes No results for input(s): "CKMB", "TROPONINI", "MYOGLOBIN" in the last 168 hours.  Invalid input(s): "CK" ------------------------------------------------------------------------------------------------------------------ No results found for: "BNP"  CBG: Recent Labs  Lab 01/02/23 1619 01/03/23 1307 01/03/23 1617 01/03/23 2037 01/04/23 0730  GLUCAP 181* 145* 140* 223* 142*     No results found for this or any previous visit (from the past 240 hour(s)).    Radiology Studies: DG Pelvis Portable  Result Date: 01/04/2023 CLINICAL DATA:  Postoperative. EXAM: PORTABLE  PELVIS 1-2 VIEWS COMPARISON:  Pelvic radiograph 12/28/2022 hip radiograph 11/08/2022 FINDINGS: Right total hip arthroplasty is intact and in appropriate position, stable compared to 12/28/2022. Interval improvement of the subcutaneous emphysema noted on prior radiograph. No acute fracture of the pelvis on this single view image. Moderate osteoarthritis of the left hip joint. Multilevel degenerative changes in the visualized lower lumbar levels. IMPRESSION: Right total hip arthroplasty without evidence of complication. Moderate left hip osteoarthritis. Electronically Signed   By: Ileana Roup M.D.   On: 01/04/2023 09:07     Marzetta Board, MD, PhD [facility]  Between 7 am - 7 pm I am available, please contact me via Amion (for emergencies) or Securechat (non urgent messages)  Between 7 pm - 7 am I am not available, please contact night coverage MD/APP via Amion

## 2023-01-04 NOTE — TOC Progression Note (Addendum)
Transition of Care [facility]) - Progression Note    Patient Details  Name: Alisha Martinez MRN: [id number] Date of Birth: 29-Jul-1956  [facility] CM/SW Contact  Joanne Chars, LCSW Phone Number: 01/04/2023, 11:11 AM  Clinical Narrative:   CSW spoke to [place], they needed update from last PT note, which was provided.  Auth approved: [id number], 3 days: 1/9-1/11.  [place] notified.   CSW spoke to daughter Butch Penny and she can come at 2pm to transport pt.   Expected Discharge Plan: Skilled Nursing Facility Barriers to Discharge: SNF Pending bed offer  Expected Discharge Plan and Services In-house Referral: Clinical Social Work   Post Acute Care Choice: [place] Living arrangements for the past 2 months: Single Family Home Expected Discharge Date: 01/04/23                                     Social Determinants of Health (SDOH) Interventions SDOH Screenings   Tobacco Use: Low Risk  (12/28/2022)    Readmission Risk Interventions     No data to display

## 2023-01-04 NOTE — Discharge Summary (Signed)
Patient ID: Skilar Marcou MRN: [id number] DOB/AGE: 1956-06-16 67 y.o.  Admit date: 12/28/2022 Discharge date: 01/04/2023  Admission Diagnoses:  Principal Problem:   Unilateral primary osteoarthritis, right hip Active Problems:   Multiple sclerosis (HCC)   Urinary dysfunction   Status post total replacement of right hip   Diabetes mellitus without complication (HCC)   Hypertension   Sinus tachycardia   Dyslipidemia   OSA (obstructive sleep apnea)   Discharge Diagnoses:  Same  Past Medical History:  Diagnosis Date   Arthritis    Asthma    Cancer ([place])    "cancerous cells found on pap smear- removed surgically"   Diabetes mellitus without complication ([place])    Fibromyalgia    Hypertension    MS (multiple sclerosis) ([place])    Sleep apnea     Surgeries: Procedure(s): RIGHT TOTAL HIP ARTHROPLASTY ANTERIOR APPROACH on 12/28/2022   Consultants: Treatment Team:  Tacey Ruiz, MD Caren Griffins, MD  Discharged Condition: Improved  Hospital Course: Alea Ryer is an 67 y.o. female who was admitted 12/28/2022 for operative treatment ofUnilateral primary osteoarthritis, right hip. Patient has severe unremitting pain that affects sleep, daily activities, and work/hobbies. After pre-op clearance the patient was taken to the operating room on 12/28/2022 and underwent  Procedure(s): RIGHT TOTAL HIP ARTHROPLASTY ANTERIOR APPROACH.    Patient was given perioperative antibiotics:  Anti-infectives (From admission, onward)    Start     Dose/Rate Route Frequency Ordered Stop   12/28/22 1515  ceFAZolin (ANCEF) IVPB 1 g/50 mL premix        1 g 100 mL/hr over 30 Minutes Intravenous Every 6 hours 12/28/22 1428 12/28/22 2235   12/28/22 0745  ceFAZolin (ANCEF) IVPB 2g/100 mL premix        2 g 200 mL/hr over 30 Minutes Intravenous On call to O.R. 12/28/22 7096 12/28/22 0912        Patient was given sequential compression devices, early ambulation, and chemoprophylaxis to prevent  DVT.  Patient benefited maximally from hospital stay and there were no complications.    Recent vital signs: Patient Vitals for the past 24 hrs:  BP Temp Temp src Pulse Resp SpO2  01/04/23 0735 -- 97.8 F (36.6 C) Oral 74 -- 97 %  01/03/23 2221 111/71 98.1 F (36.7 C) Oral -- 20 99 %  01/03/23 1616 118/74 97.6 F (36.4 C) Oral 69 19 99 %     Recent laboratory studies:  Recent Labs    01/02/23 0252  WBC 9.4  HGB 12.5  HCT 37.3  PLT 255  NA 136  K 3.7  CL 99  CO2 26  BUN 22  CREATININE 0.87  GLUCOSE 132*  CALCIUM 9.3     Discharge Medications:   Allergies as of 01/04/2023       Reactions   Compazine [prochlorperazine]    Muscle pain   Lisinopril Swelling        Medication List     TAKE these medications    aspirin 81 MG chewable tablet Chew 1 tablet (81 mg total) by mouth 2 (two) times daily. What changed: when to take this   atorvastatin 80 MG tablet Commonly known as: LIPITOR Take 80 mg by mouth daily.   baclofen 10 MG tablet Commonly known as: LIORESAL Take 10 mg by mouth 3 (three) times daily.   chlorthalidone 25 MG tablet Commonly known as: HYGROTON Take 25 mg by mouth daily.   diclofenac 75 MG EC tablet Commonly known as: VOLTAREN Take 75  mg by mouth 2 (two) times daily.   ergocalciferol 1.25 MG (50000 UT) capsule Commonly known as: VITAMIN D2 Take 50,000 Units by mouth every Friday.   HYDROcodone-acetaminophen 5-325 MG tablet Commonly known as: NORCO/VICODIN Take 1-2 tablets by mouth every 6 (six) hours as needed for moderate pain.   Jardiance 25 MG Tabs tablet Generic drug: empagliflozin Take 25 mg by mouth daily.   levocetirizine 5 MG tablet Commonly known as: XYZAL Take 5 mg by mouth every evening.   metFORMIN 1000 MG tablet Commonly known as: GLUCOPHAGE Take 1,000 mg by mouth 2 (two) times daily with a meal.   Metoprolol Tartrate 37.5 MG Tabs Take 37.5 mg by mouth 2 (two) times daily.   mirabegron ER 50 MG Tb24  tablet Commonly known as: Myrbetriq Take 1 tablet (50 mg total) by mouth daily.   oxybutynin 15 MG 24 hr tablet Commonly known as: DITROPAN XL Take 15 mg by mouth at bedtime.   potassium chloride SA 20 MEQ tablet Commonly known as: KLOR-CON M Take 1 tablet (20 mEq total) by mouth 2 (two) times daily.   pregabalin 100 MG capsule Commonly known as: LYRICA Take 100 mg by mouth 3 (three) times daily.   Rebif 44 MCG/0.5ML Sosy injection Generic drug: interferon beta-1a Inject 0.5 mLs (44 mcg total) into the skin every Monday, Wednesday, and Friday.   traMADol 50 MG tablet Commonly known as: ULTRAM Take 1-2 tablets (50-100 mg total) by mouth every 6 (six) hours as needed.               Durable Medical Equipment  (From admission, onward)           Start     Ordered   12/28/22 1429  DME 3 n 1  Once        12/28/22 1428   12/28/22 1429  DME Walker rolling  Once       Question Answer Comment  Walker: With 5 Inch Wheels   Patient needs a walker to treat with the following condition Status post total replacement of right hip      12/28/22 1428            Diagnostic Studies: ECHOCARDIOGRAM COMPLETE  Result Date: 12/31/2022    ECHOCARDIOGRAM REPORT   Patient Name:   KAETLYN Huwe Date of Exam: 12/31/2022 Medical Rec #:  [id number]   Height:       64.0 in Accession #:    [id number]  Weight:       214.0 lb Date of Birth:  1956/09/27  BSA:          2.013 m Patient Age:    42 years    BP:           122/96 mmHg Patient Gender: F           HR:           102 bpm. Exam Location:  Inpatient Procedure: 2D Echo, Cardiac Doppler, Color Doppler and Intracardiac            Opacification Agent Indications:    R00.8 Other abnormalities of heart beat  History:        Patient has no prior history of Echocardiogram examinations.                 Abnormal ECG; Arrythmias:Tachycardia.  Sonographer:    Roseanna Rainbow RDCS Referring Phys: [id number] JENNIFER YATES  Sonographer Comments: Suboptimal parasternal  window, suboptimal subcostal window, suboptimal apical window, Technically difficult  study due to poor echo windows and patient is obese. Image acquisition challenging due to patient body habitus. Extremely difficult due to habitus. Waited 30 mins to get patient back in bed from toileting. IMPRESSIONS  1. Akinesis of the anteroseptal/anterior/inferoseptal wall from base to apex. Left ventricular ejection fraction, by estimation, is 40 to 45%. The left ventricle has mildly decreased function. The left ventricle demonstrates global hypokinesis. Left ventricular diastolic parameters are indeterminate.  2. Right ventricular systolic function is normal. The right ventricular size is normal. Tricuspid regurgitation signal is inadequate for assessing PA pressure.  3. No evidence of mitral valve regurgitation.  4. The aortic valve is grossly normal. Aortic valve regurgitation is not visualized.  5. The inferior vena cava is normal in size with greater than 50% respiratory variability, suggesting right atrial pressure of 3 mmHg. Comparison(s): No prior Echocardiogram. Conclusion(s)/Recommendation(s): Has wall motion abnormality, consider ischemic evaluation. FINDINGS  Left Ventricle: Akinesis of the anteroseptal/anterior/inferoseptal wall from base to apex. Left ventricular ejection fraction, by estimation, is 40 to 45%. The left ventricle has mildly decreased function. The left ventricle demonstrates global hypokinesis. Definity contrast agent was given IV to delineate the left ventricular endocardial borders. The left ventricular internal cavity size was normal in size. There is no left ventricular hypertrophy. Abnormal (paradoxical) septal motion, consistent with left bundle branch block. Left ventricular diastolic parameters are indeterminate. Right Ventricle: The right ventricular size is normal. Right ventricular systolic function is normal. Tricuspid regurgitation signal is inadequate for assessing PA pressure. Left  Atrium: Left atrial size was normal in size. Right Atrium: Right atrial size was normal in size. Pericardium: There is no evidence of pericardial effusion. Mitral Valve: No evidence of mitral valve regurgitation. Tricuspid Valve: Tricuspid valve regurgitation is not demonstrated. Aortic Valve: The aortic valve is grossly normal. Aortic valve regurgitation is not visualized. Pulmonic Valve: Pulmonic valve regurgitation is not visualized. Aorta: The aortic root and ascending aorta are structurally normal, with no evidence of dilitation. Venous: The inferior vena cava is normal in size with greater than 50% respiratory variability, suggesting right atrial pressure of 3 mmHg. IAS/Shunts: The interatrial septum was not well visualized.  LEFT VENTRICLE PLAX 2D LVIDd:         4.30 cm      Diastology LVIDs:         3.60 cm      LV e' medial:    4.79 cm/s LV PW:         1.00 cm      LV E/e' medial:  12.5 LV IVS:        1.00 cm      LV e' lateral:   5.11 cm/s LVOT diam:     2.40 cm      LV E/e' lateral: 11.7 LV SV:         58 LV SV Index:   29 LVOT Area:     4.52 cm  LV Volumes (MOD) LV vol d, MOD A2C: 118.0 ml LV vol d, MOD A4C: 114.0 ml LV vol s, MOD A2C: 70.0 ml LV vol s, MOD A4C: 76.7 ml LV SV MOD A2C:     48.0 ml LV SV MOD A4C:     114.0 ml LV SV MOD BP:      43.1 ml RIGHT VENTRICLE RV S prime:     10.20 cm/s TAPSE (M-mode): 1.4 cm LEFT ATRIUM             Index  RIGHT ATRIUM          Index LA diam:        2.60 cm 1.29 cm/m   RA Area:     7.76 cm LA Vol (A2C):   36.2 ml 17.98 ml/m  RA Volume:   12.60 ml 6.26 ml/m LA Vol (A4C):   29.0 ml 14.40 ml/m LA Biplane Vol: 32.2 ml 15.99 ml/m  AORTIC VALVE LVOT Vmax:   92.70 cm/s LVOT Vmean:  58.950 cm/s LVOT VTI:    0.128 m  AORTA Ao Root diam: 3.30 cm Ao Asc diam:  3.20 cm MITRAL VALVE MV Area (PHT): 10.25 cm   SHUNTS MV Decel Time: 74 msec     Systemic VTI:  0.13 m MV E velocity: 59.70 cm/s  Systemic Diam: 2.40 cm MV A velocity: 83.80 cm/s MV E/A ratio:  0.71 Mary  Scientist, physiological signed by Phineas Inches Signature Date/Time: 12/31/2022/3:36:35 PM    Final    DG HIP UNILAT WITH PELVIS 1V RIGHT  Result Date: 12/28/2022 CLINICAL DATA:  Elective surgery.  Anterior right hip arthroplasty. EXAM: DG HIP (WITH OR WITHOUT PELVIS) 1V RIGHT COMPARISON:  Pelvis and right hip radiographs 11/08/2022 FINDINGS: Images were performed intraoperatively without the presence of a radiologist. New total right hip arthroplasty. No hardware complication is seen. Total fluoroscopy images: 3 Total fluoroscopy time: 26 seconds Total dose: Radiation Exposure Index (as provided by the fluoroscopic device): 3.86 mGy air Kerma Please see intraoperative findings for further detail. IMPRESSION: Intraoperative fluoroscopy for total right hip arthroplasty. Electronically Signed   By: Yvonne Kendall M.D.   On: 12/28/2022 12:50   DG Pelvis Portable  Result Date: 12/28/2022 CLINICAL DATA:  Status post right hip replacement. EXAM: PORTABLE PELVIS 1-2 VIEWS COMPARISON:  None Available. FINDINGS: Right hip arthroplasty in expected alignment. No periprosthetic lucency or fracture. Recent postsurgical change includes air and edema in the soft tissues. IMPRESSION: Right hip arthroplasty without immediate postoperative complication. Electronically Signed   By: Keith Rake M.D.   On: 12/28/2022 11:26   DG C-Arm 1-60 Min-No Report  Result Date: 12/28/2022 Fluoroscopy was utilized by the requesting physician.  No radiographic interpretation.    Disposition: Discharge disposition: 03-Skilled [place] information for follow-up providers     Mcarthur Rossetti, MD Follow up in 2 week(s).   Specialty: Orthopedic Surgery Contact information: [place] [address] [phone]         Janina Mayo, MD Follow up in 2 week(s).   Specialty: Cardiology Contact information: [address] [place] [address] [phone]               Contact information for after-discharge care     Destination     [facility] Preferred SNF .   Service: Skilled Nursing Contact information: [place] [phone]                      Signed: Mcarthur Rossetti 01/04/2023, 8:11 AM

## 2023-01-04 NOTE — Progress Notes (Signed)
Discharge summary packet provided to Pt's daughter with instructions. She Verbalized understanding of instructions. No complaints. Pt d/c to [facility] as ordered. Pt remains alert/oriented in no apparent distress. Pt's dtr is responsible for pt's transport to  [place].

## 2023-01-04 NOTE — TOC Transition Note (Signed)
Transition of Care [facility]) - CM/SW Discharge Note   Patient Details  Name: Alisha Martinez MRN: [id number] Date of Birth: Jun 04, 1956  [facility]) CM/SW Contact:  Joanne Chars, LCSW Phone Number: 01/04/2023, 11:18 AM   Clinical Narrative:   Pt discharging to [facility].  RN call report to [phone].   Pt daughter Alisha Martinez will arrive at 2pm to transport pt to [place].     Final next level of care: Skilled Nursing Facility Barriers to Discharge: Barriers Resolved   Patient Goals and CMS Choice CMS Medicare.gov Compare Post Acute Care list provided to:: Patient Represenative (must comment) Choice offered to / list presented to : Adult Children (daughter Alisha Martinez)  Discharge Placement                Patient chooses bed at: [facility] Patient to be transferred to facility by: daughter Alisha Martinez Name of family member notified: daughter Alisha Martinez Patient and family notified of of transfer: 01/04/23  Discharge Plan and Services Additional resources added to the After Visit Summary for   In-house Referral: Clinical Social Work   Post Acute Care Choice: [place]                               Social Determinants of Health (SDOH) Interventions SDOH Screenings   Tobacco Use: Low Risk  (12/28/2022)     Readmission Risk Interventions     No data to display

## 2023-01-10 ENCOUNTER — Encounter: Payer: Self-pay | Admitting: Orthopaedic Surgery

## 2023-01-10 ENCOUNTER — Ambulatory Visit (INDEPENDENT_AMBULATORY_CARE_PROVIDER_SITE_OTHER): Payer: Medicare PPO | Admitting: Orthopaedic Surgery

## 2023-01-10 DIAGNOSIS — Z96641 Presence of right artificial hip joint: Secondary | ICD-10-CM

## 2023-01-10 NOTE — Progress Notes (Signed)
The patient is a 67 year old female who will be 2 weeks tomorrow status post a right total hip arthroplasty.  She has been convalescing in the skilled nursing facility.  She is someone who is morbidly obese so we wanted to watch her incision closely.  On exam all sutures removed except for the superior most incisions in the groin crease.  I plan to keep those 1 more week.  I showed her how to have some dry gauze in her groin crease area every day and she still can get her incision wet in shower.  The remainder of her incision looks good and the sutures have been removed and Steri-Strips applied.  There is some mild swelling to be expected.  She still has weakness in that thigh and I let her know that we will improve as time goes by.  Will see her back in 1 week just for suture removal of the remaining sutures in the groin crease area.

## 2023-01-18 ENCOUNTER — Encounter: Payer: Self-pay | Admitting: Orthopaedic Surgery

## 2023-01-18 ENCOUNTER — Ambulatory Visit (INDEPENDENT_AMBULATORY_CARE_PROVIDER_SITE_OTHER): Payer: Medicare PPO | Admitting: Orthopaedic Surgery

## 2023-01-18 DIAGNOSIS — Z96641 Presence of right artificial hip joint: Secondary | ICD-10-CM

## 2023-01-18 NOTE — Progress Notes (Signed)
The patient is now 3 weeks today status post a right total hip arthroplasty.  We wanted to see her back today to look at her incision and remove sutures out from the groin crease.  She is someone is morbidly obese.  She is scheduled to be discharged to skilled nursing at the end of the week to home.  Home health therapy will be set up for her they say.  Fortunately her right hip incision continues to look good.  She has been keeping it dry in the groin crease.  I did remove 4 sutures from that area.  She tolerated that well.  Her leg lengths are equal.  Will see her back in 4 weeks to see how she is doing overall but no x-rays are needed.  All questions and concerns were addressed and answered.

## 2023-01-28 ENCOUNTER — Ambulatory Visit: Payer: Medicare PPO | Admitting: Internal Medicine

## 2023-01-28 ENCOUNTER — Telehealth: Payer: Self-pay | Admitting: Orthopaedic Surgery

## 2023-01-28 NOTE — Telephone Encounter (Signed)
Please advise 

## 2023-01-28 NOTE — Telephone Encounter (Signed)
Lvm for her to cb to advise

## 2023-01-28 NOTE — Telephone Encounter (Signed)
Anna Genre from [facility] called to request verbal orders for Speech therapy 1 time a week for 8 weeks [phone] callback number

## 2023-01-31 ENCOUNTER — Telehealth: Payer: Self-pay | Admitting: Orthopaedic Surgery

## 2023-01-31 NOTE — Telephone Encounter (Signed)
[facility] giving update on HR 140, asymptomatic.Marland Kitchen

## 2023-01-31 NOTE — Telephone Encounter (Signed)
Advised 

## 2023-01-31 NOTE — Telephone Encounter (Signed)
Called pt. She stated understanding and she will call and r/s appt with Dr. Harl Bowie with Cardiology

## 2023-02-03 ENCOUNTER — Other Ambulatory Visit: Payer: Self-pay | Admitting: *Deleted

## 2023-02-03 DIAGNOSIS — G35 Multiple sclerosis: Secondary | ICD-10-CM

## 2023-02-03 MED ORDER — REBIF 44 MCG/0.5ML ~~LOC~~ SOSY: 44.0000 ug | PREFILLED_SYRINGE | SUBCUTANEOUS | 1 refills | Status: DC

## 2023-02-08 ENCOUNTER — Telehealth: Payer: Self-pay | Admitting: Orthopaedic Surgery

## 2023-02-08 NOTE — Telephone Encounter (Signed)
Patient aware she is more than welcome to go out of her house If she is wanting to drive she can NOT be on pain medication

## 2023-02-08 NOTE — Telephone Encounter (Signed)
Pt called requesting a call back concerning when she she able to go out to stores and grocery using electric scooter while shopping. Please call pt at [phone].

## 2023-02-09 ENCOUNTER — Telehealth (HOSPITAL_COMMUNITY): Payer: Self-pay | Admitting: *Deleted

## 2023-02-09 ENCOUNTER — Encounter (HOSPITAL_COMMUNITY): Payer: Self-pay | Admitting: *Deleted

## 2023-02-09 ENCOUNTER — Encounter: Payer: Self-pay | Admitting: Internal Medicine

## 2023-02-09 ENCOUNTER — Ambulatory Visit: Payer: Medicare PPO | Attending: Internal Medicine | Admitting: Internal Medicine

## 2023-02-09 VITALS — BP 92/64 | HR 73 | Ht 64.0 in | Wt 212.4 lb

## 2023-02-09 DIAGNOSIS — I5022 Chronic systolic (congestive) heart failure: Secondary | ICD-10-CM | POA: Diagnosis not present

## 2023-02-09 DIAGNOSIS — R9431 Abnormal electrocardiogram [ECG] [EKG]: Secondary | ICD-10-CM | POA: Diagnosis not present

## 2023-02-09 DIAGNOSIS — R931 Abnormal findings on diagnostic imaging of heart and coronary circulation: Secondary | ICD-10-CM

## 2023-02-09 NOTE — Progress Notes (Signed)
Cardiology Office Note:    Date:  02/09/2023   ID:  Alisha Martinez, DOB 1956-10-24, MRN [id number]  PCP:  Patient, No Pcp Per   [facility] Providers Cardiologist:  None     Referring MD: Mcarthur Rossetti*   No chief complaint on file. Apical WMA  History of Present Illness:    Alisha Martinez is a 67 y.o. female with a hx of DM2-7.2 , HTN, OSA-not wearing cpap, MS who underwent hip replacement 1/2. She had sinus tachycardia in the hospital prompting hospitalist team consult. EKG showed sinus tach with left bundle morphology. She had an echo done which looks like an apical scar. She was started on aspirin, statin and BB. Apparently I discussed this patient with Dr. Lorin Mercy and planned to follow-up as an outpatient.   She just got back from rehab. She went to [place], she had a great experience. She was walking with steps. Denies CP. She gets heartburn. She denies angina, dyspnea on exertion, lower extremity edema, PND or orthopnea.    Cardiology Studies: 12/31/2022: EF 40-45% , apical thinning and WMA, nl RV function, no significant valve dx  03/27/2022 TTE ( [place]): EF 55-60%, nl RV, no valve dx  Past Medical History:  Diagnosis Date   Arthritis    Asthma    Cancer ([place])    "cancerous cells found on pap smear- removed surgically"   Diabetes mellitus without complication ([place])    Fibromyalgia    Hypertension    MS (multiple sclerosis) ([place])    Sleep apnea     Past Surgical History:  Procedure Laterality Date   APPENDECTOMY     EYE SURGERY Bilateral 2023   "repaired torn retina"   KNEE SURGERY     TOTAL HIP ARTHROPLASTY Right 12/28/2022   Procedure: RIGHT TOTAL HIP ARTHROPLASTY ANTERIOR APPROACH;  Surgeon: Mcarthur Rossetti, MD;  Location: [facility];  Service: Orthopedics;  Laterality: Right;   VAGINA SURGERY     "pt states the doctor surgically removed cancerous cells from vaginal area"    Current Medications: Current Outpatient  Medications on File Prior to Visit  Medication Sig Dispense Refill   aspirin 81 MG chewable tablet Chew 1 tablet (81 mg total) by mouth 2 (two) times daily. 30 tablet 0   atorvastatin (LIPITOR) 80 MG tablet Take 80 mg by mouth daily.     baclofen (LIORESAL) 10 MG tablet Take 10 mg by mouth 3 (three) times daily.     chlorthalidone (HYGROTON) 25 MG tablet Take 25 mg by mouth daily.     diclofenac (VOLTAREN) 75 MG EC tablet Take 75 mg by mouth 2 (two) times daily.     ergocalciferol (VITAMIN D2) 1.25 MG (50000 UT) capsule Take 50,000 Units by mouth every Friday.     HYDROcodone-acetaminophen (NORCO/VICODIN) 5-325 MG tablet Take 1-2 tablets by mouth every 6 (six) hours as needed for moderate pain. 30 tablet 0   interferon beta-1a (REBIF) 44 MCG/0.5ML SOSY injection Inject 0.5 mLs (44 mcg total) into the skin every Monday, Wednesday, and Friday. 18 mL 1   JARDIANCE 25 MG TABS tablet Take 25 mg by mouth daily.     levocetirizine (XYZAL) 5 MG tablet Take 5 mg by mouth every evening.     metFORMIN (GLUCOPHAGE) 1000 MG tablet Take 1,000 mg by mouth 2 (two) times daily with a meal.     Metoprolol Tartrate 37.5 MG TABS Take 37.5 mg by mouth 2 (two) times daily.  mirabegron ER (MYRBETRIQ) 50 MG TB24 tablet Take 1 tablet (50 mg total) by mouth daily. 30 tablet 11   oxybutynin (DITROPAN XL) 15 MG 24 hr tablet Take 15 mg by mouth at bedtime.     potassium chloride SA (KLOR-CON M) 20 MEQ tablet Take 1 tablet (20 mEq total) by mouth 2 (two) times daily. 30 tablet 0   pregabalin (LYRICA) 100 MG capsule Take 100 mg by mouth 3 (three) times daily.     traMADol (ULTRAM) 50 MG tablet Take 1-2 tablets (50-100 mg total) by mouth every 6 (six) hours as needed. 40 tablet 0   No current facility-administered medications on file prior to visit.     Allergies:   Compazine [prochlorperazine] and Lisinopril   Social History   Socioeconomic History   Marital status: Divorced    Spouse name: Not on file   Number  of children: 2   Years of education: Not on file   Highest education level: High school graduate  Occupational History   Not on file  Tobacco Use   Smoking status: Never   Smokeless tobacco: Never  Vaping Use   Vaping Use: Never used  Substance and Sexual Activity   Alcohol use: Not Currently   Drug use: Never   Sexual activity: Not on file  Other Topics Concern   Not on file  Social History Narrative   Lives w daughter   Right handed   Caffeine: occa drinks tea   Social Determinants of Health   Financial Resource Strain: Not on file  Food Insecurity: Not on file  Transportation Needs: Not on file  Physical Activity: Not on file  Stress: Not on file  Social Connections: Not on file     Family History: The patient's family history includes Alopecia in her mother; Cancer in her father, mother, and sister; Diabetes in her father.  ROS:   Please see the history of present illness.     All other systems reviewed and are negative.  EKGs/Labs/Other Studies Reviewed:    The following studies were reviewed today:   EKG:  EKG is  ordered today.  The ekg ordered today demonstrates   02/09/2023- NSR LBBB morphology  Recent Labs: 01/02/2023: ALT 27; BUN 22; Creatinine, Ser 0.87; Hemoglobin 12.5; Magnesium 1.7; Platelets 255; Potassium 3.7; Sodium 136   Recent Lipid Panel No results found for: "CHOL", "TRIG", "HDL", "CHOLHDL", "VLDL", "LDLCALC", "LDLDIRECT"   Risk Assessment/Calculations:     Physical Exam:    VS:   Vitals:   02/09/23 0838  BP: 92/64  Pulse: 73  SpO2: 94%    Wt Readings from Last 3 Encounters:  12/28/22 214 lb (97.1 kg)  12/22/22 213 lb (96.6 kg)  11/22/22 216 lb (98 kg)     GEN: Slurred speech (chronic). Obese. 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 Will plan for SPECT and continue medical management. She is asymptomatic and euvolemic.  -continue asa 81 mg daily -continue metop tartrate 37.5 mg BID  DM2- continue jardiance 25 mg daily. A1c goal < 7  HLD: continue lipitor 80 mg daily. LDL 64 mg/dL  HTN: well controlled. No changes   PLAN:    In order of problems listed above:  Lexiscan Myoview SPECT Follow up 6 months      Shared Decision Making/Informed Consent The risks [chest pain, shortness of breath, cardiac arrhythmias, dizziness, blood pressure fluctuations, myocardial infarction, stroke/transient ischemic attack, nausea, vomiting, allergic reaction, radiation exposure, metallic taste sensation and life-threatening complications (estimated to be 1 in 10,000)], benefits (risk stratification, diagnosing coronary artery disease, treatment guidance) and alternatives of a nuclear stress test were discussed in detail with Alisha Martinez and she agrees to proceed.    Medication Adjustments/Labs and Tests Ordered: Current medicines are reviewed at length with the patient today.  Concerns regarding medicines are outlined above.  No orders of the defined types were placed in this encounter.  No orders of the defined types were placed in this encounter.   There are no Patient Instructions on file for this visit.   Signed, Janina Mayo, MD  02/09/2023 8:37 AM    [place]

## 2023-02-09 NOTE — Patient Instructions (Addendum)
Medication Instructions:   No changes  *If you need a refill on your cardiac medications before your next appointment, please call your pharmacy*   Lab Work: Not needed     Testing/Procedures: Will Be Schedule at [facility] [address]  Your doctor has scheduled you for a Myocardial Perfusion scan . It  will obtain information about the blood flow to your heart. The test consists of taking pictures of your heart in two phases: while resting and after a stress test.  You will be given a drug intended to have a similar effect on the heart to that of exercise.  The test will take approximately 3 to 4  hours to complete.  If you are pregnant or breastfeeding,  please notify the staff prior to your test.  How to prepare for your test: Do not eat or drink 2 hours prior to your test Do not consume products containing caffeine 12 hours prior to your test (examples: coffee (regular OR decaf), chocolate, sodas, tea) Your doctor may need you to hold certain medications prior to the test.  If so, these are listed below and should not be taken for 24 hours prior to the test.  If not listed below, you may take your medications as normal.  You may resume taking held medications on your normal schedule once the test is complete.   Meds to hold: not needed Do bring a list of your current medications with you.  If you have held any meds in preparation for the test, please bring them, as you may be required to take them once the test is completed. Do wear comfortable clothes and walking shoes.  Do not wear dresses or overalls. Do NOT wear cologne, perfume, aftershave, or fragranced lotions the day of your test (deodorants okay). If these instructions are not followed your test will have to be rescheduled.   A nuclear cardiologist will review your test, prepare a report and send it to your physician.   If you have questions or concerns about your appointment, you can call the Nuclear Cardiology  department at [phone]. If you cannot keep your appointment, please provide 48 hours notification to avoid a possible $50.00 charge to your account.   Please arrive 15 minutes prior to your appointment time for registration and insurance purposes   Follow-Up: At [facility], you and your health needs are our priority.  As part of our continuing mission to provide you with exceptional heart care, we have created designated Provider Care Teams.  These Care Teams include your primary Cardiologist (physician) and Advanced Practice Providers (APPs -  Physician Assistants and Nurse Practitioners) who all work together to provide you with the care you need, when you need it.  We recommend signing up for the patient portal called "MyChart".  Sign up information is provided on this After Visit Summary.  MyChart is used to connect with patients for Virtual Visits (Telemedicine).  Patients are able to view lab/test results, encounter notes, upcoming appointments, etc.  Non-urgent messages can be sent to your provider as well.   To learn more about what you can do with MyChart, go to [email or web address].    Your next appointment:   6 month(s)  The format for your next appointment:   In Person  Provider:   Dr Phineas Inches

## 2023-02-09 NOTE — Telephone Encounter (Signed)
Instructions letter sent via [employer] for upcoming stress test on 02/14/23 at 8:00.

## 2023-02-10 ENCOUNTER — Telehealth: Payer: Self-pay | Admitting: Neurology

## 2023-02-10 DIAGNOSIS — G35 Multiple sclerosis: Secondary | ICD-10-CM

## 2023-02-10 MED ORDER — REBIF REBIDOSE 44 MCG/0.5ML ~~LOC~~ SOAJ: 0.5000 mL | SUBCUTANEOUS | 3 refills | Status: DC

## 2023-02-10 NOTE — Telephone Encounter (Signed)
Pt is calling. Requesting a call back. Stated she wants nurse to call [facility] to get her device ( REBIJBCET) that she can do it herself. Stated the device need too be overnight.

## 2023-02-10 NOTE — Telephone Encounter (Signed)
Pt states this last medication called in by Dr Felecia Shelling : interferon beta-1a (REBIF) 44 MCG/0.5ML SOSY injection is not what she was previously taking.  Pt is asking for a call re: her being switched to what she was previously on , please call.

## 2023-02-10 NOTE — Telephone Encounter (Signed)
She was taking Rebif rebidose previously and this is what she wants to continue on. Switched to [employer]. Aware we will send updated prescription for Rebif rebidose. E-scribed rx.  She will reach out to [facility] at [phone] to see if they can help in the interim.

## 2023-02-10 NOTE — Telephone Encounter (Addendum)
Called [employer] at [phone]. Spoke w/ Katie who transferred me to [place] who transferred me to pharmacist. Provided VO for Rebidose 14mg three times weekly. 1 box, 1 refills. They are unable to overnight. They need patient assistance approval first before they can ship it out to pt.   I called pt back at [phone] She states she actually does want Rebiject device to use with syringes she got for Rebif from her specialty pharmacy. Aware I will call [facility] back and see if they can switch this. Spoke w/ SColletta [place] She transferred me to pharmacist, [facility]. Advised rx rebiject should be sent to pt.  Rx rebidose discontinued. They are unable to overnight. They need patient assistance approval first before they can ship it out to pt.  Advised pt should call tomorrow afternoon to check on status. Phone: [phone]   I called pt back at [phone] Relayed above info. She will call tomorrow. If any issues filling, she will call back Monday.

## 2023-02-11 ENCOUNTER — Telehealth (HOSPITAL_COMMUNITY): Payer: Self-pay | Admitting: *Deleted

## 2023-02-11 NOTE — Telephone Encounter (Signed)
Pt returned call and was given instructions for MPI study scheduled on 02/14/23.

## 2023-02-14 ENCOUNTER — Encounter: Payer: Self-pay | Admitting: Internal Medicine

## 2023-02-14 ENCOUNTER — Ambulatory Visit (HOSPITAL_COMMUNITY): Payer: Medicare PPO | Attending: Internal Medicine

## 2023-02-14 DIAGNOSIS — R931 Abnormal findings on diagnostic imaging of heart and coronary circulation: Secondary | ICD-10-CM | POA: Diagnosis not present

## 2023-02-14 DIAGNOSIS — I5022 Chronic systolic (congestive) heart failure: Secondary | ICD-10-CM | POA: Insufficient documentation

## 2023-02-14 DIAGNOSIS — R9431 Abnormal electrocardiogram [ECG] [EKG]: Secondary | ICD-10-CM | POA: Diagnosis not present

## 2023-02-14 LAB — MYOCARDIAL PERFUSION IMAGING
Base ST Depression (mm): 0 mm
LV dias vol: 86 mL (ref 46–106)
LV sys vol: 62 mL
Nuc Stress EF: 28 %
Peak HR: 89 {beats}/min
Rest HR: 69 {beats}/min
Rest Nuclear Isotope Dose: 10.9 mCi
SDS: 4
SRS: 1
SSS: 5
ST Depression (mm): 0 mm
Stress Nuclear Isotope Dose: 30.1 mCi
TID: 0.94

## 2023-02-14 MED ORDER — TECHNETIUM TC 99M TETROFOSMIN IV KIT
10.9000 | PACK | Freq: Once | INTRAVENOUS | Status: AC | PRN
  Administered 2023-02-14: 10.9 via INTRAVENOUS

## 2023-02-14 MED ORDER — REGADENOSON 0.4 MG/5ML IV SOLN
0.4000 mg | Freq: Once | INTRAVENOUS | Status: AC
  Administered 2023-02-14: 0.4 mg via INTRAVENOUS

## 2023-02-14 MED ORDER — TECHNETIUM TC 99M TETROFOSMIN IV KIT
30.1000 | PACK | Freq: Once | INTRAVENOUS | Status: AC | PRN
  Administered 2023-02-14: 30.1 via INTRAVENOUS

## 2023-02-16 ENCOUNTER — Encounter: Payer: Self-pay | Admitting: Orthopaedic Surgery

## 2023-02-16 ENCOUNTER — Ambulatory Visit (INDEPENDENT_AMBULATORY_CARE_PROVIDER_SITE_OTHER): Payer: Medicare PPO | Admitting: Orthopaedic Surgery

## 2023-02-16 ENCOUNTER — Telehealth: Payer: Self-pay | Admitting: Orthopaedic Surgery

## 2023-02-16 ENCOUNTER — Other Ambulatory Visit: Payer: Self-pay

## 2023-02-16 DIAGNOSIS — Z96641 Presence of right artificial hip joint: Secondary | ICD-10-CM

## 2023-02-16 NOTE — Progress Notes (Signed)
The patient is now around 7 weeks status post a right total hip arthroplasty.  She does ambulate with a rolling walker.  She is only 59.  She does deal with MS and is also on Lyrica.  She does report some burning pain in her right hip and we talked about the possibility of some physical therapy just to help with her balance and coordination any modalities to calm down any type of trochanteric bursitis and IT band pain as a relates to her surgery.  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  Her right operative hip moves smoothly and fluidly today.  There is no pain in the groin at all.  Her leg lengths are near equal.  Overall she looks great but she does have pain over the trochanteric area and the IT band.  I would like to send her to outpatient physical therapy to help with right hip strengthening and her overall mobility.  Any modalities over the trochanteric area and the IT band will be per the therapist discretion.  I will then see her back in 6 weeks after course of therapy.  She agrees with this treatment plan.  All questions and concerns were addressed and answered.

## 2023-02-16 NOTE — Telephone Encounter (Signed)
Patient want a letter printed stating that she was seen today by dr. Ninfa Linden, I printed her AVS but she wants a letter she can pick up.

## 2023-02-16 NOTE — Telephone Encounter (Signed)
Note completed 

## 2023-02-17 ENCOUNTER — Telehealth: Payer: Self-pay | Admitting: Neurology

## 2023-02-17 NOTE — Telephone Encounter (Signed)
Pt called stating that her Interferon Beta-1a (REBIF REBIDOSE) 27 MCG/0.5ML SOAJ is needing a PA  Please advise.

## 2023-02-18 ENCOUNTER — Other Ambulatory Visit (HOSPITAL_COMMUNITY): Payer: Self-pay

## 2023-02-18 ENCOUNTER — Other Ambulatory Visit: Payer: Self-pay | Admitting: Family Medicine

## 2023-02-18 DIAGNOSIS — Z13820 Encounter for screening for osteoporosis: Secondary | ICD-10-CM

## 2023-02-18 NOTE — Telephone Encounter (Signed)
Prior Authorization had been submitted and [employer] faxed questions to be answered.  Have faxed the answers back in waiting for determination

## 2023-02-21 NOTE — Therapy (Signed)
OUTPATIENT PHYSICAL THERAPY EVALUATION   Patient Name: Alisha Martinez MRN: [id number] DOB:1956-02-15, 67 y.o., female Today's Date: 02/22/2023  END OF SESSION:  PT End of Session - 02/22/23 1511     Visit Number 1    Number of Visits 20    Date for PT Re-Evaluation 05/03/23    Authorization Type [employer] $25 copay    PT Start Time 1520    PT Stop Time 1600    PT Time Calculation (min) 40 min    Activity Tolerance Patient tolerated treatment well    Behavior During Therapy WFL for tasks assessed/performed             Past Medical History:  Diagnosis Date   Arthritis    Asthma    Cancer ([place])    "cancerous cells found on pap smear- removed surgically"   Diabetes mellitus without complication ([place])    Fibromyalgia    Hypertension    MS (multiple sclerosis) ([place])    Sleep apnea    Past Surgical History:  Procedure Laterality Date   APPENDECTOMY     EYE SURGERY Bilateral 2023   "repaired torn retina"   KNEE SURGERY     TOTAL HIP ARTHROPLASTY Right 12/28/2022   Procedure: RIGHT TOTAL HIP ARTHROPLASTY ANTERIOR APPROACH;  Surgeon: Mcarthur Rossetti, MD;  Location: [facility];  Service: Orthopedics;  Laterality: Right;   VAGINA SURGERY     "pt states the doctor surgically removed cancerous cells from vaginal area"   Patient Active Problem List   Diagnosis Date Noted   Diabetes mellitus without complication ([place]) [id number]   Hypertension 12/31/2022   Asthma 12/31/2022   Sinus tachycardia 12/31/2022   Dyslipidemia 12/31/2022   OSA (obstructive sleep apnea) 12/31/2022   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  Was in hospital for a week, then went to SNF then went home with home health PT.  She indicated having some trouble at times with sleeping but not taking medicine for sleeping.  She indicated feeling   PERTINENT HISTORY: PMH includes Multiple sclerosis, cancer, DM, HTN.  PAIN:  NPRS scale: at current 5/10 , at worst in last week or so 8/10 Pain location: Rt hip laterally/thigh Pain description: constant, burning  Aggravating factors: prolonged standing, walking, bending, squatting.  Relieving factors: rest  PRECAUTIONS: Anterior hip  WEIGHT BEARING RESTRICTIONS: No  FALLS:  Has patient fallen in last 6 months? No  LIVING ENVIRONMENT: Lives with: daughter, friend Lives in: House/apartment Stairs: 17 stairs with rail on Lt going up Has following equipment at home: Rollator and quad cane  OCCUPATION: Disability to retired  PLOF: Independent, line dancing (was limited by pain in hip prior to surgery), shopping, usually drives.   PATIENT GOALS: Reduce pain, get back to dancing, driving, walk independent.   OBJECTIVE:    PATIENT SURVEYS:  02/22/2023 FOTO intake:  43  predicted:  53  COGNITION: 02/22/2023 Overall cognitive  status: WFL    SENSATION: 02/22/2023 [facility]  MUSCLE LENGTH: 02/22/2023 No specific measurements  PALPATION: 02/22/2023 Tenderness in lateral thigh musculature and greater trochanter, down lateral thigh  LOWER EXTREMITY ROM:   ROM Right 02/22/2023 Held for future assessment due to time Left 02/22/2023  Hip flexion    Hip extension    Hip abduction    Hip adduction    Hip internal rotation    Hip external rotation    Knee flexion    Knee extension    Ankle dorsiflexion    Ankle plantarflexion     Ankle inversion    Ankle eversion     (Blank rows = not tested)  LOWER EXTREMITY MMT:  MMT Right 02/22/2023 Left 02/22/2023  Hip flexion    Hip extension    Hip abduction    Hip adduction    Hip internal rotation    Hip external rotation    Knee flexion    Knee extension    Ankle dorsiflexion    Ankle plantarflexion    Ankle inversion    Ankle eversion     (Blank rows = not tested)  LOWER EXTREMITY SPECIAL TESTS:  02/22/2023 No specific testing today  FUNCTIONAL TESTS:  02/22/2023  TUG c rollator:  16.75 18 inch chair transfer: s UE assist with increased difficulty.  Lt SLS: unable  Rt SLS: unable    GAIT: 02/22/2023 Rollator use in clinic c forwared trunk lean, decreased gait speed, decreased step length with Rt LE, decrease stance.    TODAY'S TREATMENT                                                                          DATE:02/22/2023 Therex:    HEP instruction/performance c cues for techniques, handout provided.  Trial set performed of each for comprehension and symptom assessment.  See below for exercise list  PATIENT EDUCATION:  02/22/2023 Education details: HEP, POC Person educated: Patient Education method: Explanation, Demonstration, Verbal cues, and Handouts Education comprehension: verbalized understanding, returned demonstration, and verbal cues required  HOME EXERCISE PROGRAM: Access Code: [email or web address] URL: [email or web address] Date: 02/22/2023 Prepared by: Scot Jun  Exercises - Seated Quad Set  - 2-3 x daily - 7 x weekly - 1 sets - 10 reps - 5 hold - Sit to Stand  - 3 x daily - 7 x weekly - 1 sets - 10 reps - Seated March  - 1-2 x daily - 7 x weekly - 1-2 sets - 10 reps  ASSESSMENT:  CLINICAL IMPRESSION: Patient is a 67 y.o. who comes to clinic with complaints of Rt hip pain with mobility, strength and movement coordination deficits that impair their ability to perform usual daily and recreational functional activities  without increase difficulty/symptoms at this time.  Patient to benefit from skilled PT services to address impairments and limitations to improve to previous level of function without restriction secondary to condition.   OBJECTIVE IMPAIRMENTS: Abnormal gait, decreased activity tolerance, decreased balance, decreased coordination, decreased endurance, decreased mobility, difficulty walking, decreased ROM, decreased strength, increased fascial restrictions, impaired perceived functional ability, impaired flexibility, improper body mechanics, and pain.   ACTIVITY LIMITATIONS: carrying, lifting, bending, sitting, standing, squatting, sleeping, stairs, transfers, and locomotion level  PARTICIPATION  LIMITATIONS: meal prep, cleaning, laundry, interpersonal relationship, driving, shopping, and community activity  PERSONAL FACTORS:  PMH includes Multiple sclerosis, cancer, DM, HTN.  are also affecting patient's functional outcome.   REHAB POTENTIAL: Good  CLINICAL DECISION MAKING: Stable/uncomplicated  EVALUATION COMPLEXITY: Low   GOALS: Goals reviewed with patient? Yes  SHORT TERM GOALS: (target date for Short term goals are 3 weeks 03/15/2023)   1.  Patient will demonstrate independent use of home exercise program to maintain progress from in clinic treatments.  Goal status: New  LONG TERM GOALS: (target dates for all long term goals are 10 weeks  05/03/2023 )   1. Patient will demonstrate/report pain at worst less than or equal to 2/10 to facilitate minimal limitation in daily activity secondary to pain symptoms.  Goal status: New   2. Patient will demonstrate independent use of home exercise program to facilitate ability to maintain/progress functional gains from skilled physical therapy services.  Goal status: New   3. Patient will demonstrate FOTO outcome > or = 53 % to indicate reduced disability due to condition.  Goal status: New   4.  Patient will demonstrate Rt LE MMT 5/5  throughout to faciltiate usual transfers, stairs, squatting at [facility] for daily life.   Goal status: New   5.  Patient will demonstrate ability to ascend and descend stairs reciprocally without UE assist for community /house integration.  Goal status: New   6.  Patient will demonstrate TUG < 14 to indicate reduce fall risk.  Goal status: New   7.  Patient will demonstrate independent ambulation community distances > 300 ft Goal Status: New   PLAN:  PT FREQUENCY: 1-2x/week  PT DURATION: 10 weeks  PLANNED INTERVENTIONS: Therapeutic exercises, Therapeutic activity, Neuro Muscular re-education, Balance training, Gait training, Patient/Family education, Joint mobilization, Stair training, DME instructions, Dry Needling, Electrical stimulation, Traction, Cryotherapy, vasopneumatic deviceMoist heat, Taping, Ultrasound, Ionotophoresis '4mg'$ /ml Dexamethasone, and Manual therapy.  All included unless contraindicated  PLAN FOR NEXT SESSION: Review HEP knowledge/results.  Gross movement improvements for strength and balance.     Scot Jun, PT, DPT, OCS, ATC 02/22/23  4:03 PM   Referring diagnosis? [id number] (ICD-10-CM) - Status post total replacement of right hip Treatment diagnosis? (if different than referring diagnosis) M25.551 What was this (referring dx) caused by? '[x]'$  Surgery '[]'$  Fall '[]'$  Ongoing issue '[]'$  Arthritis '[]'$  Other: ____________  Laterality: '[x]'$  Rt '[]'$  Lt '[]'$  Both  Check all possible CPT codes:  *CHOOSE 10 OR LESS*    '[]'$  97110 (Therapeutic Exercise)  '[]'$  92507 (SLP Treatment)  '[]'$  97112 (Neuro Re-ed)   '[]'$  92526 (Swallowing Treatment)   '[]'$  97116 (Gait Training)   '[]'$  16109 (Cognitive Training, 1st 15 minutes) '[]'$  97140 (Manual Therapy)   '[]'$  97130 (Cognitive Training, each add'l 15 minutes)  '[]'$  97164 (Re-evaluation)                              '[]'$  Other, List CPT Code ____________  '[]'$  [id number] (Therapeutic Activities)     '[]'$  [id number] (Self Care)   '[x]'$  All codes above (97110 -  97535)  '[]'$  97012 (Mechanical Traction)  '[x]'$  97014 (E-stim Unattended)  '[]'$  97032 (E-stim manual)  '[]'$  97033 (Ionto)  '[]'$  97035 (Ultrasound) '[x]'$  97750 (Physical Performance Training) '[]'$  [id number] (Aquatic Therapy) '[]'$  97016 (Vasopneumatic Device) '[]'$  [id number] (Paraffin) '[]'$  97034 (Contrast Bath) '[]'$  97597 (Wound Care 1st 20 sq cm) '[]'$  97598 (Wound Care each add'l 20 sq cm) '[]'$   97760 (Orthotic Fabrication, Fitting, Training Initial) '[]'$  [id number] (Prosthetic Management and Training Initial) '[]'$  [phone] (Orthotic or Prosthetic Training/ Modification Subsequent)

## 2023-02-22 ENCOUNTER — Other Ambulatory Visit: Payer: Self-pay

## 2023-02-22 ENCOUNTER — Ambulatory Visit (INDEPENDENT_AMBULATORY_CARE_PROVIDER_SITE_OTHER): Payer: Medicare PPO | Admitting: Rehabilitative and Restorative Service Providers"

## 2023-02-22 ENCOUNTER — Encounter: Payer: Self-pay | Admitting: Rehabilitative and Restorative Service Providers"

## 2023-02-22 DIAGNOSIS — M6281 Muscle weakness (generalized): Secondary | ICD-10-CM | POA: Diagnosis not present

## 2023-02-22 DIAGNOSIS — M25551 Pain in right hip: Secondary | ICD-10-CM | POA: Diagnosis not present

## 2023-02-22 DIAGNOSIS — R262 Difficulty in walking, not elsewhere classified: Secondary | ICD-10-CM | POA: Diagnosis not present

## 2023-02-23 ENCOUNTER — Telehealth: Payer: Self-pay | Admitting: Orthopaedic Surgery

## 2023-02-23 NOTE — Telephone Encounter (Signed)
Abigail Butts said she will call patient Thursday.
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
## 2023-02-24 ENCOUNTER — Ambulatory Visit (INDEPENDENT_AMBULATORY_CARE_PROVIDER_SITE_OTHER): Payer: Medicare PPO | Admitting: Physical Therapy

## 2023-02-24 ENCOUNTER — Encounter: Payer: Self-pay | Admitting: Physical Therapy

## 2023-02-24 DIAGNOSIS — M25551 Pain in right hip: Secondary | ICD-10-CM

## 2023-02-24 DIAGNOSIS — M6281 Muscle weakness (generalized): Secondary | ICD-10-CM | POA: Diagnosis not present

## 2023-02-24 DIAGNOSIS — R262 Difficulty in walking, not elsewhere classified: Secondary | ICD-10-CM | POA: Diagnosis not present
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
## 2023-02-25 NOTE — Telephone Encounter (Signed)
Ordered from [employer], they will call her to get this delivered Patient aware

## 2023-02-25 NOTE — Therapy (Unsigned)
OUTPATIENT PHYSICAL THERAPY TREATMENT   Patient Name: Alisha Martinez MRN: [id number] DOB:10/24/1956, 67 y.o., female Today's Date: 02/28/2023  END OF SESSION:  PT End of Session - 02/28/23 1157     Visit Number 3    Number of Visits 20    Date for PT Re-Evaluation 05/03/23    Authorization Type [employer] $25 copay    PT Start Time 1140    PT Stop Time 1225    PT Time Calculation (min) 45 min    Activity Tolerance Patient tolerated treatment well    Behavior During Therapy WFL for tasks assessed/performed              Past Medical History:  Diagnosis Date   Arthritis    Asthma    Cancer ([place])    "cancerous cells found on pap smear- removed surgically"   Diabetes mellitus without complication ([place])    Fibromyalgia    Hypertension    MS (multiple sclerosis) ([place])    Sleep apnea    Past Surgical History:  Procedure Laterality Date   APPENDECTOMY     EYE SURGERY Bilateral 2023   "repaired torn retina"   KNEE SURGERY     TOTAL HIP ARTHROPLASTY Right 12/28/2022   Procedure: RIGHT TOTAL HIP ARTHROPLASTY ANTERIOR APPROACH;  Surgeon: Mcarthur Rossetti, MD;  Location: [facility];  Service: Orthopedics;  Laterality: Right;   VAGINA SURGERY     "pt states the doctor surgically removed cancerous cells from vaginal area"   Patient Active Problem List   Diagnosis Date Noted   Diabetes mellitus without complication ([place]) [id number]   Hypertension 12/31/2022   Asthma 12/31/2022   Sinus tachycardia 12/31/2022   Dyslipidemia 12/31/2022   OSA (obstructive sleep apnea) 12/31/2022   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   Other fatigue 11/22/2022   Multiple sclerosis ([place]) 10/26/2021   Pain in both lower extremities 10/26/2021   Urinary dysfunction 10/26/2021   Numbness 10/26/2021    PCP: None listed in epic  REFERRING PROVIDER: Mcarthur Rossetti, MD  REFERRING DIAG: [phone] (ICD-10-CM) - Status post total replacement of right hip  THERAPY DIAG:  Pain  in right hip  Difficulty in walking, not elsewhere classified  Muscle weakness (generalized)  Rationale for Evaluation and Treatment: Rehabilitation  ONSET DATE: 12/28/2022 surgery  SUBJECTIVE:   SUBJECTIVE STATEMENT: Pt states that she walked around a lot over the weekend. She continues to note lower back pain and lateral thigh pain.   PERTINENT HISTORY: PMH includes Multiple sclerosis, cancer, DM, HTN.  PAIN:  NPRS scale: at current 7/10 , at worst in last week or so 8/10 Pain location: Rt hip laterally/thigh Pain description: constant, burning  Aggravating factors: prolonged standing, walking, bending, squatting.  Relieving factors: rest  PRECAUTIONS: Anterior hip  WEIGHT BEARING RESTRICTIONS: No  FALLS:  Has patient fallen in last 6 months? No  LIVING ENVIRONMENT: Lives with: daughter, friend Lives in: House/apartment Stairs: 17 stairs with rail on Lt going up Has following equipment at home: Rollator and quad cane  OCCUPATION: Disability to retired  PLOF: Independent, line dancing (was limited by pain in hip prior to surgery), shopping, usually drives.   PATIENT GOALS: Reduce pain, get back to dancing, driving, walk independent.   OBJECTIVE:    PATIENT SURVEYS:  02/22/2023 FOTO intake:  43  predicted:  53  COGNITION: 02/22/2023 Overall cognitive status: WFL    SENSATION: 02/22/2023 [facility]  MUSCLE LENGTH: 02/22/2023 No specific measurements  PALPATION: 02/22/2023 Tenderness in lateral  thigh musculature and greater trochanter, down lateral thigh  LOWER EXTREMITY ROM:   ROM Right 02/22/2023 Held for future assessment due to time RIght 02/24/2023  Hip flexion  100  Hip extension    Hip abduction  30  Hip adduction  10  Hip internal rotation    Hip external rotation    Knee flexion    Knee extension    Ankle dorsiflexion    Ankle plantarflexion    Ankle inversion    Ankle eversion     (Blank rows = not tested)  LOWER EXTREMITY MMT:  MMT in  sitting Right 02/24/2023   Hip flexion 3+   Hip extension    Hip abduction 3+   Hip adduction    Hip internal rotation    Hip external rotation    Knee flexion 4   Knee extension 4   Ankle dorsiflexion    Ankle plantarflexion    Ankle inversion    Ankle eversion     (Blank rows = not tested)  LOWER EXTREMITY SPECIAL TESTS:  02/22/2023 No specific testing today  FUNCTIONAL TESTS:  02/22/2023  TUG c rollator:  16.75 18 inch chair transfer: s UE assist with increased difficulty.  Lt SLS: unable  Rt SLS: unable    GAIT: 02/22/2023 Rollator use in clinic c forwared trunk lean, decreased gait speed, decreased step length with Rt LE, decrease stance.    TODAY'S TREATMENT                                                                           DATE: 02/28/23 Nu step L5 X 6 min UE/LE ( Pt requested after trying the recumbent bike for a min)  Standing hip abduction/ extension/ flexion X 10 bilat Standing hip marches X 10 bilat Seated hip adduction isometric ball squeeze 5 sec  X10 Seated clams red X 10 Sit to stands no UE support 2X5 Gait with quad cane 50 feet X 2, progressed to step through reciprocal pattern.  Supine ITB stretch 10 sec X 5 with strap PROM Rt hip flexion, adduction, IR,ER to tolerance Cold pack to Rt hip X 8 min after session not included in treatment time.   02/24/23 Nu step L5 X 6 min UE/LE Recumbent bike (to see if she had the ROM to do it as she has one at home) L1 X 2 min Standing hip abduction X 10 bilat Standing hip marches X 10 bilat Seated hip adduction isometric ball squeeze 5 sec  X10 Seated clams red X 10 Sit to stands no UE support 2X5 Gait with quad cane 50 feet X 2, progressed to step through reciprocal pattern.  Supine ITB stretch 10 sec X 5 with strap PROM Rt hip flexion, adduction, IR,ER to tolerance Cold pack to Rt hip X 8 min after session not included in treatment time.   02/22/2023 Therex:    HEP instruction/performance c cues for  techniques, handout provided.  Trial set performed of each for comprehension and symptom assessment.  See below for exercise list  PATIENT EDUCATION:  02/22/2023 Education details: HEP, POC Person educated: Patient Education method: Explanation, Demonstration, Verbal cues, and Handouts Education comprehension: verbalized understanding, returned demonstration, and verbal cues required  HOME EXERCISE PROGRAM: Access Code: [email or web address] URL: [email or web address] Date: 02/22/2023 Prepared by: Scot Jun  Exercises - Seated Quad Set  - 2-3 x daily - 7 x weekly - 1 sets - 10 reps - 5 hold - Sit to Stand  - 3 x daily - 7 x weekly - 1 sets - 10 reps - Seated March  - 1-2 x daily - 7 x weekly - 1-2 sets - 10 reps  ASSESSMENT:  CLINICAL IMPRESSION: We worked to improve her Rt hip ROM and strength as tolerated. She attempted to use the recumbent bike, but demonstrated increased difficulty with full rotations. Pt requested to utilize nustep instead. Pt still seeking out replacement part for her walker. PT recommending to continue with current plan.   OBJECTIVE IMPAIRMENTS: Abnormal gait, decreased activity tolerance, decreased balance, decreased coordination, decreased endurance, decreased mobility, difficulty walking, decreased ROM, decreased strength, increased fascial restrictions, impaired perceived functional ability, impaired flexibility, improper body mechanics, and pain.   ACTIVITY LIMITATIONS: carrying, lifting, bending, sitting, standing, squatting, sleeping, stairs, transfers, and locomotion level  PARTICIPATION LIMITATIONS: meal prep, cleaning, laundry, interpersonal relationship, driving, shopping, and community activity  PERSONAL FACTORS:  PMH includes Multiple sclerosis, cancer, DM, HTN.  are also affecting patient's functional outcome.   REHAB POTENTIAL: Good  CLINICAL DECISION MAKING: Stable/uncomplicated  EVALUATION COMPLEXITY: Low   GOALS: Goals reviewed  with patient? Yes  SHORT TERM GOALS: (target date for Short term goals are 3 weeks 03/15/2023)   1.  Patient will demonstrate independent use of home exercise program to maintain progress from in clinic treatments.  Goal status: New  LONG TERM GOALS: (target dates for all long term goals are 10 weeks  05/03/2023 )   1. Patient will demonstrate/report pain at worst less than or equal to 2/10 to facilitate minimal limitation in daily activity secondary to pain symptoms.  Goal status: New   2. Patient will demonstrate independent use of home exercise program to facilitate ability to maintain/progress functional gains from skilled physical therapy services.  Goal status: New   3. Patient will demonstrate FOTO outcome > or = 53 % to indicate reduced disability due to condition.  Goal status: New   4.  Patient will demonstrate Rt LE MMT 5/5 throughout to faciltiate usual transfers, stairs, squatting at [facility] for daily life.   Goal status: New   5.  Patient will demonstrate ability to ascend and descend stairs reciprocally without UE assist for community /house integration.  Goal status: New   6.  Patient will demonstrate TUG < 14 to indicate reduce fall risk.  Goal status: New   7.  Patient will demonstrate independent ambulation community distances > 300 ft Goal Status: New   PLAN:  PT FREQUENCY: 1-2x/week  PT DURATION: 10 weeks  PLANNED INTERVENTIONS: Therapeutic exercises, Therapeutic activity, Neuro Muscular re-education, Balance training, Gait training, Patient/Family education, Joint mobilization, Stair training, DME instructions, Dry Needling, Electrical stimulation, Traction, Cryotherapy, vasopneumatic deviceMoist heat, Taping, Ultrasound, Ionotophoresis '4mg'$ /ml Dexamethasone, and Manual therapy.  All included unless contraindicated  PLAN FOR NEXT SESSION: Review HEP knowledge/results.  Gross movement improvements for strength, ROM, gait   Rudi Heap PT, DPT 02/28/23   12:27 PM     Referring diagnosis? [id number] (ICD-10-CM) - Status post total replacement of right hip Treatment diagnosis? (if different than referring diagnosis) M25.551 What was this (referring dx) caused by? '[x]'$  Surgery '[]'$  Fall '[]'$  Ongoing issue '[]'$  Arthritis '[]'$  Other: ____________  Laterality: '[x]'$  Rt '[]'$  Lt '[]'$  Both  Check all possible  CPT codes:  *CHOOSE 10 OR LESS*    '[]'$  97110 (Therapeutic Exercise)  '[]'$  92507 (SLP Treatment)  '[]'$  97112 (Neuro Re-ed)   '[]'$  92526 (Swallowing Treatment)   '[]'$  97116 (Gait Training)   '[]'$  [id number] (Cognitive Training, 1st 15 minutes) '[]'$  97140 (Manual Therapy)   '[]'$  97130 (Cognitive Training, each add'l 15 minutes)  '[]'$  97164 (Re-evaluation)                              '[]'$  Other, List CPT Code ____________  '[]'$  97530 (Therapeutic Activities)     '[]'$  60454 (Self Care)   '[x]'$  All codes above (97110 - 97535)  '[]'$  97012 (Mechanical Traction)  '[x]'$  97014 (E-stim Unattended)  '[]'$  97032 (E-stim manual)  '[]'$  97033 (Ionto)  '[]'$  97035 (Ultrasound) '[x]'$  97750 (Physical Performance Training) '[]'$  [id number] (Aquatic Therapy) '[]'$  97016 (Vasopneumatic Device) '[]'$  [id number] (Paraffin) '[]'$  97034 (Contrast Bath) '[]'$  97597 (Wound Care 1st 20 sq cm) '[]'$  97598 (Wound Care each add'l 20 sq cm) '[]'$  97760 (Orthotic Fabrication, Fitting, Training Initial) '[]'$  [id number] (Prosthetic Management and Training Initial) '[]'$  [id number] (Orthotic or Prosthetic Training/ Modification Subsequent)

## 2023-02-28 ENCOUNTER — Ambulatory Visit (INDEPENDENT_AMBULATORY_CARE_PROVIDER_SITE_OTHER): Payer: Medicare PPO | Admitting: Physical Therapy

## 2023-02-28 ENCOUNTER — Encounter: Payer: Self-pay | Admitting: Physical Therapy

## 2023-02-28 DIAGNOSIS — R262 Difficulty in walking, not elsewhere classified: Secondary | ICD-10-CM | POA: Diagnosis not present

## 2023-02-28 DIAGNOSIS — M6281 Muscle weakness (generalized): Secondary | ICD-10-CM

## 2023-02-28 DIAGNOSIS — M25551 Pain in right hip: Secondary | ICD-10-CM | POA: Diagnosis not present

## 2023-03-02 ENCOUNTER — Telehealth: Payer: Self-pay | Admitting: Orthopaedic Surgery

## 2023-03-02 ENCOUNTER — Other Ambulatory Visit: Payer: Self-pay | Admitting: Orthopaedic Surgery

## 2023-03-02 MED ORDER — TRAMADOL HCL 50 MG PO TABS: 50.0000 mg | ORAL_TABLET | Freq: Four times a day (QID) | ORAL | 0 refills | Status: DC | PRN

## 2023-03-02 NOTE — Telephone Encounter (Signed)
Patient called needing Rx refilled Tramadol. Patient also asked if she can get a Rx for a cane. She said she just want a regular cane.   The number to contact patient is [phone]

## 2023-03-03 ENCOUNTER — Encounter: Payer: Self-pay | Admitting: Radiology

## 2023-03-03 NOTE — Therapy (Signed)
OUTPATIENT PHYSICAL THERAPY TREATMENT   Patient Name: Alisha Martinez MRN: [id number] DOB:December 31, 1955, 67 y.o., female Today's Date: 03/04/2023  END OF SESSION:  PT End of Session - 03/04/23 1103     Visit Number 4    Number of Visits 20    Date for PT Re-Evaluation 05/03/23    Authorization Type [employer] $25 copay    PT Start Time 1100    PT Stop Time 1140    PT Time Calculation (min) 40 min    Activity Tolerance Patient tolerated treatment well    Behavior During Therapy WFL for tasks assessed/performed               Past Medical History:  Diagnosis Date   Arthritis    Asthma    Cancer ([place])    "cancerous cells found on pap smear- removed surgically"   Diabetes mellitus without complication ([place])    Fibromyalgia    Hypertension    MS (multiple sclerosis) ([place])    Sleep apnea    Past Surgical History:  Procedure Laterality Date   APPENDECTOMY     EYE SURGERY Bilateral 2023   "repaired torn retina"   KNEE SURGERY     TOTAL HIP ARTHROPLASTY Right 12/28/2022   Procedure: RIGHT TOTAL HIP ARTHROPLASTY ANTERIOR APPROACH;  Surgeon: Mcarthur Rossetti, MD;  Location: [place];  Service: Orthopedics;  Laterality: Right;   VAGINA SURGERY     "pt states the doctor surgically removed cancerous cells from vaginal area"   Patient Active Problem List   Diagnosis Date Noted   Diabetes mellitus without complication ([place]) [id number]   Hypertension 12/31/2022   Asthma 12/31/2022   Sinus tachycardia 12/31/2022   Dyslipidemia 12/31/2022   OSA (obstructive sleep apnea) 12/31/2022   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   Other fatigue 11/22/2022   Multiple sclerosis ([place]) 10/26/2021   Pain in both lower extremities 10/26/2021   Urinary dysfunction 10/26/2021   Numbness 10/26/2021    PCP: None listed in epic  REFERRING PROVIDER: Mcarthur Rossetti, MD  REFERRING DIAG: [phone] (ICD-10-CM) - Status post total replacement of right hip  THERAPY DIAG:   Pain in right hip  Muscle weakness (generalized)  Difficulty in walking, not elsewhere classified  Rationale for Evaluation and Treatment: Rehabilitation  ONSET DATE: 12/28/2022 surgery  SUBJECTIVE:   SUBJECTIVE STATEMENT: Pt states that she walked around a lot over the weekend. She continues to note lower back pain and lateral thigh pain.   PERTINENT HISTORY: PMH includes Multiple sclerosis, cancer, DM, HTN.  PAIN:  NPRS scale: at current 7/10 , at worst in last week or so 8/10 Pain location: Rt hip laterally/thigh Pain description: constant, burning  Aggravating factors: prolonged standing, walking, bending, squatting.  Relieving factors: rest  PRECAUTIONS: Anterior hip  WEIGHT BEARING RESTRICTIONS: No  FALLS:  Has patient fallen in last 6 months? No  LIVING ENVIRONMENT: Lives with: daughter, friend Lives in: House/apartment Stairs: 17 stairs with rail on Lt going up Has following equipment at home: Rollator and quad cane  OCCUPATION: Disability to retired  PLOF: Independent, line dancing (was limited by pain in hip prior to surgery), shopping, usually drives.   PATIENT GOALS: Reduce pain, get back to dancing, driving, walk independent.   OBJECTIVE:    PATIENT SURVEYS:  02/22/2023 FOTO intake:  43  predicted:  53  COGNITION: 02/22/2023 Overall cognitive status: WFL    SENSATION: 02/22/2023 [facility]  MUSCLE LENGTH: 02/22/2023 No specific measurements  PALPATION: 02/22/2023 Tenderness in  lateral thigh musculature and greater trochanter, down lateral thigh  LOWER EXTREMITY ROM:   ROM Right 02/22/2023 Held for future assessment due to time RIght 02/24/2023  Hip flexion  100  Hip extension    Hip abduction  30  Hip adduction  10  Hip internal rotation    Hip external rotation    Knee flexion    Knee extension    Ankle dorsiflexion    Ankle plantarflexion    Ankle inversion    Ankle eversion     (Blank rows = not tested)  LOWER EXTREMITY  MMT:  MMT in sitting Right 02/24/2023   Hip flexion 3+   Hip extension    Hip abduction 3+   Hip adduction    Hip internal rotation    Hip external rotation    Knee flexion 4   Knee extension 4   Ankle dorsiflexion    Ankle plantarflexion    Ankle inversion    Ankle eversion     (Blank rows = not tested)  LOWER EXTREMITY SPECIAL TESTS:  02/22/2023 No specific testing today  FUNCTIONAL TESTS:  02/22/2023  TUG c rollator:  16.75 18 inch chair transfer: s UE assist with increased difficulty.  Lt SLS: unable  Rt SLS: unable    GAIT: 02/22/2023 Rollator use in clinic c forwared trunk lean, decreased gait speed, decreased step length with Rt LE, decrease stance.    TODAY'S TREATMENT                                                                           DATE: 03/04/23 Nu step L5 X 6 min UE/LE  Standing hip abduction/ extension/ flexion 2 X 10 bilat Standing hip marches X 10 bilat Seated hip adduction isometric ball squeeze 5 sec  X10 Seated clams red X 10 Sit to stands no UE support 2X5 Step up to 6" step 2x10, bilat  Cold pack to Rt hip X 8 min after session not included in treatment time.   02/28/23 Nu step L5 X 6 min UE/LE ( Pt requested after trying the recumbent bike for a min)  Standing hip abduction/ extension/ flexion X 10 bilat Standing hip marches X 10 bilat Seated hip adduction isometric ball squeeze 5 sec  X10 Seated clams red X 10 Sit to stands no UE support 2X5 Gait with quad cane 50 feet X 2, progressed to step through reciprocal pattern.  Supine ITB stretch 10 sec X 5 with strap PROM Rt hip flexion, adduction, IR,ER to tolerance Cold pack to Rt hip X 8 min after session not included in treatment time.   02/24/23 Nu step L5 X 6 min UE/LE Recumbent bike (to see if she had the ROM to do it as she has one at home) L1 X 2 min Standing hip abduction X 10 bilat Standing hip marches X 10 bilat Seated hip adduction isometric ball squeeze 5 sec  X10 Seated  clams red X 10 Sit to stands no UE support 2X5 Gait with quad cane 50 feet X 2, progressed to step through reciprocal pattern.  Supine ITB stretch 10 sec X 5 with strap PROM Rt hip flexion, adduction, IR,ER to tolerance Cold pack to Rt hip X  8 min after session not included in treatment time.   02/22/2023 Therex:    HEP instruction/performance c cues for techniques, handout provided.  Trial set performed of each for comprehension and symptom assessment.  See below for exercise list  PATIENT EDUCATION:  02/22/2023 Education details: HEP, POC Person educated: Patient Education method: Explanation, Demonstration, Verbal cues, and Handouts Education comprehension: verbalized understanding, returned demonstration, and verbal cues required  HOME EXERCISE PROGRAM: Access Code: [email or web address] URL: [email or web address] Date: 02/22/2023 Prepared by: Scot Jun  Exercises - Seated Quad Set  - 2-3 x daily - 7 x weekly - 1 sets - 10 reps - 5 hold - Sit to Stand  - 3 x daily - 7 x weekly - 1 sets - 10 reps - Seated March  - 1-2 x daily - 7 x weekly - 1-2 sets - 10 reps  ASSESSMENT:  CLINICAL IMPRESSION: We worked to improve her Rt hip strength as tolerated. Pt was able to progress with strengthening activities today with increased time required to move R LE with intermittent assistance with elevation onto nustep and leg press. Pt received a new walker. PT recommending to continue with current plan.   OBJECTIVE IMPAIRMENTS: Abnormal gait, decreased activity tolerance, decreased balance, decreased coordination, decreased endurance, decreased mobility, difficulty walking, decreased ROM, decreased strength, increased fascial restrictions, impaired perceived functional ability, impaired flexibility, improper body mechanics, and pain.   ACTIVITY LIMITATIONS: carrying, lifting, bending, sitting, standing, squatting, sleeping, stairs, transfers, and locomotion level  PARTICIPATION  LIMITATIONS: meal prep, cleaning, laundry, interpersonal relationship, driving, shopping, and community activity  PERSONAL FACTORS:  PMH includes Multiple sclerosis, cancer, DM, HTN.  are also affecting patient's functional outcome.   REHAB POTENTIAL: Good  CLINICAL DECISION MAKING: Stable/uncomplicated  EVALUATION COMPLEXITY: Low   GOALS: Goals reviewed with patient? Yes  SHORT TERM GOALS: (target date for Short term goals are 3 weeks 03/15/2023)   1.  Patient will demonstrate independent use of home exercise program to maintain progress from in clinic treatments.  Goal status: New  LONG TERM GOALS: (target dates for all long term goals are 10 weeks  05/03/2023 )   1. Patient will demonstrate/report pain at worst less than or equal to 2/10 to facilitate minimal limitation in daily activity secondary to pain symptoms.  Goal status: New   2. Patient will demonstrate independent use of home exercise program to facilitate ability to maintain/progress functional gains from skilled physical therapy services.  Goal status: New   3. Patient will demonstrate FOTO outcome > or = 53 % to indicate reduced disability due to condition.  Goal status: New   4.  Patient will demonstrate Rt LE MMT 5/5 throughout to faciltiate usual transfers, stairs, squatting at [facility] for daily life.   Goal status: New   5.  Patient will demonstrate ability to ascend and descend stairs reciprocally without UE assist for community /house integration.  Goal status: New   6.  Patient will demonstrate TUG < 14 to indicate reduce fall risk.  Goal status: New   7.  Patient will demonstrate independent ambulation community distances > 300 ft Goal Status: New   PLAN:  PT FREQUENCY: 1-2x/week  PT DURATION: 10 weeks  PLANNED INTERVENTIONS: Therapeutic exercises, Therapeutic activity, Neuro Muscular re-education, Balance training, Gait training, Patient/Family education, Joint mobilization, Stair training, DME  instructions, Dry Needling, Electrical stimulation, Traction, Cryotherapy, vasopneumatic deviceMoist heat, Taping, Ultrasound, Ionotophoresis '4mg'$ /ml Dexamethasone, and Manual therapy.  All included unless contraindicated  PLAN FOR NEXT SESSION: Review HEP knowledge/results.  Gross movement improvements for strength, ROM, gait   Rudi Heap PT, DPT 03/04/23  11:40 AM     Referring diagnosis? [id number] (ICD-10-CM) - Status post total replacement of right hip Treatment diagnosis? (if different than referring diagnosis) M25.551 What was this (referring dx) caused by? '[x]'$  Surgery '[]'$  Fall '[]'$  Ongoing issue '[]'$  Arthritis '[]'$  Other: ____________  Laterality: '[x]'$  Rt '[]'$  Lt '[]'$  Both  Check all possible CPT codes:  *CHOOSE 10 OR LESS*    '[]'$  97110 (Therapeutic Exercise)  '[]'$  92507 (SLP Treatment)  '[]'$  97112 (Neuro Re-ed)   '[]'$  92526 (Swallowing Treatment)   '[]'$  97116 (Gait Training)   '[]'$  [id number] (Cognitive Training, 1st 15 minutes) '[]'$  97140 (Manual Therapy)   '[]'$  97130 (Cognitive Training, each add'l 15 minutes)  '[]'$  97164 (Re-evaluation)                              '[]'$  Other, List CPT Code ____________  '[]'$  [id number] (Therapeutic Activities)     '[]'$  97535 (Self Care)   '[x]'$  All codes above (97110 - 97535)  '[]'$  97012 (Mechanical Traction)  '[x]'$  97014 (E-stim Unattended)  '[]'$  97032 (E-stim manual)  '[]'$  97033 (Ionto)  '[]'$  97035 (Ultrasound) '[x]'$  97750 (Physical Performance Training) '[]'$  [id number] (Aquatic Therapy) '[]'$  97016 (Vasopneumatic Device) '[]'$  [id number] (Paraffin) '[]'$  97034 (Contrast Bath) '[]'$  97597 (Wound Care 1st 20 sq cm) '[]'$  97598 (Wound Care each add'l 20 sq cm) '[]'$  97760 (Orthotic Fabrication, Fitting, Training Initial) '[]'$  [id number] (Prosthetic Management and Training Initial) '[]'$  [id number] (Orthotic or Prosthetic Training/ Modification Subsequent)

## 2023-03-03 NOTE — Telephone Encounter (Signed)
Patient aware Tramadol was sent in. I told her that she can get a standard cane at any pharmacy/[facility]; she still asks for a prescription and I let her know that insurance still make not cover this with a Rx

## 2023-03-04 ENCOUNTER — Ambulatory Visit (INDEPENDENT_AMBULATORY_CARE_PROVIDER_SITE_OTHER): Payer: Medicare PPO | Admitting: Physical Therapy

## 2023-03-04 DIAGNOSIS — M25551 Pain in right hip: Secondary | ICD-10-CM

## 2023-03-04 DIAGNOSIS — M6281 Muscle weakness (generalized): Secondary | ICD-10-CM | POA: Diagnosis not present

## 2023-03-04 DIAGNOSIS — R262 Difficulty in walking, not elsewhere classified: Secondary | ICD-10-CM | POA: Diagnosis not present

## 2023-03-08 ENCOUNTER — Encounter: Payer: Medicare PPO | Admitting: Physical Therapy

## 2023-03-08 NOTE — Therapy (Deleted)
OUTPATIENT PHYSICAL THERAPY TREATMENT   Patient Name: Alisha Martinez MRN: [id number] DOB:09/25/1956, 67 y.o., female Today's Date: 03/08/2023  END OF SESSION:      Past Medical History:  Diagnosis Date   Arthritis    Asthma    Cancer ([place])    "cancerous cells found on pap smear- removed surgically"   Diabetes mellitus without complication ([place])    Fibromyalgia    Hypertension    MS (multiple sclerosis) ([place])    Sleep apnea    Past Surgical History:  Procedure Laterality Date   APPENDECTOMY     EYE SURGERY Bilateral 2023   "repaired torn retina"   KNEE SURGERY     TOTAL HIP ARTHROPLASTY Right 12/28/2022   Procedure: RIGHT TOTAL HIP ARTHROPLASTY ANTERIOR APPROACH;  Surgeon: Mcarthur Rossetti, MD;  Location: [facility];  Service: Orthopedics;  Laterality: Right;   VAGINA SURGERY     "pt states the doctor surgically removed cancerous cells from vaginal area"   Patient Active Problem List   Diagnosis Date Noted   Diabetes mellitus without complication ([place]) [id number]   Hypertension 12/31/2022   Asthma 12/31/2022   Sinus tachycardia 12/31/2022   Dyslipidemia 12/31/2022   OSA (obstructive sleep apnea) 12/31/2022   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   Other fatigue 11/22/2022   Multiple sclerosis ([place]) 10/26/2021   Pain in both lower extremities 10/26/2021   Urinary dysfunction 10/26/2021   Numbness 10/26/2021    PCP: None listed in epic  REFERRING PROVIDER: Mcarthur Rossetti, MD  REFERRING DIAG: [id number] (ICD-10-CM) - Status post total replacement of right hip  THERAPY DIAG:  No diagnosis found.  Rationale for Evaluation and Treatment: Rehabilitation  ONSET DATE: 12/28/2022 surgery  SUBJECTIVE:   SUBJECTIVE STATEMENT: Pt states that she walked around a lot over the weekend. She continues to note lower back pain and lateral thigh pain.   PERTINENT HISTORY: PMH includes Multiple sclerosis, cancer, DM, HTN.  PAIN:  NPRS scale: at  current 7/10 , at worst in last week or so 8/10 Pain location: Rt hip laterally/thigh Pain description: constant, burning  Aggravating factors: prolonged standing, walking, bending, squatting.  Relieving factors: rest  PRECAUTIONS: Anterior hip  WEIGHT BEARING RESTRICTIONS: No  FALLS:  Has patient fallen in last 6 months? No  LIVING ENVIRONMENT: Lives with: daughter, friend Lives in: House/apartment Stairs: 17 stairs with rail on Lt going up Has following equipment at home: Rollator and quad cane  OCCUPATION: Disability to retired  PLOF: Independent, line dancing (was limited by pain in hip prior to surgery), shopping, usually drives.   PATIENT GOALS: Reduce pain, get back to dancing, driving, walk independent.   OBJECTIVE:    PATIENT SURVEYS:  02/22/2023 FOTO intake:  43  predicted:  59  COGNITION: 02/22/2023 Overall cognitive status: WFL    SENSATION: 02/22/2023 [facility]  MUSCLE LENGTH: 02/22/2023 No specific measurements  PALPATION: 02/22/2023 Tenderness in lateral thigh musculature and greater trochanter, down lateral thigh  LOWER EXTREMITY ROM:   ROM Right 02/22/2023 Held for future assessment due to time RIght 02/24/2023  Hip flexion  100  Hip extension    Hip abduction  30  Hip adduction  10  Hip internal rotation    Hip external rotation    Knee flexion    Knee extension    Ankle dorsiflexion    Ankle plantarflexion    Ankle inversion    Ankle eversion     (Blank rows = not tested)  LOWER EXTREMITY MMT:  MMT in sitting Right 02/24/2023   Hip flexion 3+   Hip extension    Hip abduction 3+   Hip adduction    Hip internal rotation    Hip external rotation    Knee flexion 4   Knee extension 4   Ankle dorsiflexion    Ankle plantarflexion    Ankle inversion    Ankle eversion     (Blank rows = not tested)  LOWER EXTREMITY SPECIAL TESTS:  02/22/2023 No specific testing today  FUNCTIONAL TESTS:  02/22/2023  TUG c rollator:  16.75 18 inch  chair transfer: s UE assist with increased difficulty.  Lt SLS: unable  Rt SLS: unable    GAIT: 02/22/2023 Rollator use in clinic c forwared trunk lean, decreased gait speed, decreased step length with Rt LE, decrease stance.    TODAY'S TREATMENT                                                                           DATE: 03/04/23 Nu step L5 X 6 min UE/LE  Standing hip abduction/ extension/ flexion 2 X 10 bilat Standing hip marches X 10 bilat Seated hip adduction isometric ball squeeze 5 sec  X10 Seated clams red X 10 Sit to stands no UE support 2X5 Step up to 6" step 2x10, bilat  Cold pack to Rt hip X 8 min after session not included in treatment time.   02/28/23 Nu step L5 X 6 min UE/LE ( Pt requested after trying the recumbent bike for a min)  Standing hip abduction/ extension/ flexion X 10 bilat Standing hip marches X 10 bilat Seated hip adduction isometric ball squeeze 5 sec  X10 Seated clams red X 10 Sit to stands no UE support 2X5 Gait with quad cane 50 feet X 2, progressed to step through reciprocal pattern.  Supine ITB stretch 10 sec X 5 with strap PROM Rt hip flexion, adduction, IR,ER to tolerance Cold pack to Rt hip X 8 min after session not included in treatment time.   02/24/23 Nu step L5 X 6 min UE/LE Recumbent bike (to see if she had the ROM to do it as she has one at home) L1 X 2 min Standing hip abduction X 10 bilat Standing hip marches X 10 bilat Seated hip adduction isometric ball squeeze 5 sec  X10 Seated clams red X 10 Sit to stands no UE support 2X5 Gait with quad cane 50 feet X 2, progressed to step through reciprocal pattern.  Supine ITB stretch 10 sec X 5 with strap PROM Rt hip flexion, adduction, IR,ER to tolerance Cold pack to Rt hip X 8 min after session not included in treatment time.   02/22/2023 Therex:    HEP instruction/performance c cues for techniques, handout provided.  Trial set performed of each for comprehension and symptom  assessment.  See below for exercise list  PATIENT EDUCATION:  02/22/2023 Education details: HEP, POC Person educated: Patient Education method: Explanation, Demonstration, Verbal cues, and Handouts Education comprehension: verbalized understanding, returned demonstration, and verbal cues required  HOME EXERCISE PROGRAM: Access Code: [email or web address] URL: [email or web address] Date: 02/22/2023 Prepared by: Scot Jun  Exercises - Seated Quad Set  - 2-3 x daily - 7 x weekly -  1 sets - 10 reps - 5 hold - Sit to Stand  - 3 x daily - 7 x weekly - 1 sets - 10 reps - Seated March  - 1-2 x daily - 7 x weekly - 1-2 sets - 10 reps  ASSESSMENT:  CLINICAL IMPRESSION: We worked to improve her Rt hip strength as tolerated. Pt was able to progress with strengthening activities today with increased time required to move R LE with intermittent assistance with elevation onto nustep and leg press. Pt received a new walker. PT recommending to continue with current plan.   OBJECTIVE IMPAIRMENTS: Abnormal gait, decreased activity tolerance, decreased balance, decreased coordination, decreased endurance, decreased mobility, difficulty walking, decreased ROM, decreased strength, increased fascial restrictions, impaired perceived functional ability, impaired flexibility, improper body mechanics, and pain.   ACTIVITY LIMITATIONS: carrying, lifting, bending, sitting, standing, squatting, sleeping, stairs, transfers, and locomotion level  PARTICIPATION LIMITATIONS: meal prep, cleaning, laundry, interpersonal relationship, driving, shopping, and community activity  PERSONAL FACTORS:  PMH includes Multiple sclerosis, cancer, DM, HTN.  are also affecting patient's functional outcome.   REHAB POTENTIAL: Good  CLINICAL DECISION MAKING: Stable/uncomplicated  EVALUATION COMPLEXITY: Low   GOALS: Goals reviewed with patient? Yes  SHORT TERM GOALS: (target date for Short term goals are 3 weeks  03/15/2023)   1.  Patient will demonstrate independent use of home exercise program to maintain progress from in clinic treatments.  Goal status: New  LONG TERM GOALS: (target dates for all long term goals are 10 weeks  05/03/2023 )   1. Patient will demonstrate/report pain at worst less than or equal to 2/10 to facilitate minimal limitation in daily activity secondary to pain symptoms.  Goal status: New   2. Patient will demonstrate independent use of home exercise program to facilitate ability to maintain/progress functional gains from skilled physical therapy services.  Goal status: New   3. Patient will demonstrate FOTO outcome > or = 53 % to indicate reduced disability due to condition.  Goal status: New   4.  Patient will demonstrate Rt LE MMT 5/5 throughout to faciltiate usual transfers, stairs, squatting at [facility] for daily life.   Goal status: New   5.  Patient will demonstrate ability to ascend and descend stairs reciprocally without UE assist for community /house integration.  Goal status: New   6.  Patient will demonstrate TUG < 14 to indicate reduce fall risk.  Goal status: New   7.  Patient will demonstrate independent ambulation community distances > 300 ft Goal Status: New   PLAN:  PT FREQUENCY: 1-2x/week  PT DURATION: 10 weeks  PLANNED INTERVENTIONS: Therapeutic exercises, Therapeutic activity, Neuro Muscular re-education, Balance training, Gait training, Patient/Family education, Joint mobilization, Stair training, DME instructions, Dry Needling, Electrical stimulation, Traction, Cryotherapy, vasopneumatic deviceMoist heat, Taping, Ultrasound, Ionotophoresis '4mg'$ /ml Dexamethasone, and Manual therapy.  All included unless contraindicated  PLAN FOR NEXT SESSION: Review HEP knowledge/results.  Gross movement improvements for strength, ROM, gait   Rudi Heap PT, DPT 03/08/23  2:30 PM     Referring diagnosis? [id number] (ICD-10-CM) - Status post total  replacement of right hip Treatment diagnosis? (if different than referring diagnosis) M25.551 What was this (referring dx) caused by? '[x]'$  Surgery '[]'$  Fall '[]'$  Ongoing issue '[]'$  Arthritis '[]'$  Other: ____________  Laterality: '[x]'$  Rt '[]'$  Lt '[]'$  Both  Check all possible CPT codes:  *CHOOSE 10 OR LESS*    '[]'$  97110 (Therapeutic Exercise)  '[]'$  92507 (SLP Treatment)  '[]'$  97112 (Neuro Re-ed)   '[]'$  92526 (Swallowing  Treatment)   '[]'$  29562 (Gait Training)   '[]'$  [id number] (Cognitive Training, 1st 15 minutes) '[]'$  97140 (Manual Therapy)   '[]'$  97130 (Cognitive Training, each add'l 15 minutes)  '[]'$  97164 (Re-evaluation)                              '[]'$  Other, List CPT Code ____________  '[]'$  97530 (Therapeutic Activities)     '[]'$  97535 (Self Care)   '[x]'$  All codes above (97110 - 97535)  '[]'$  97012 (Mechanical Traction)  '[x]'$  97014 (E-stim Unattended)  '[]'$  97032 (E-stim manual)  '[]'$  97033 (Ionto)  '[]'$  97035 (Ultrasound) '[x]'$  97750 (Physical Performance Training) '[]'$  [id number] (Aquatic Therapy) '[]'$  97016 (Vasopneumatic Device) '[]'$  [id number] (Paraffin) '[]'$  97034 (Contrast Bath) '[]'$  97597 (Wound Care 1st 20 sq cm) '[]'$  97598 (Wound Care each add'l 20 sq cm) '[]'$  97760 (Orthotic Fabrication, Fitting, Training Initial) '[]'$  [id number] (Prosthetic Management and Training Initial) '[]'$  [id number] (Orthotic or Prosthetic Training/ Modification Subsequent)

## 2023-03-09 ENCOUNTER — Encounter: Payer: Self-pay | Admitting: Physical Therapy

## 2023-03-09 ENCOUNTER — Ambulatory Visit (INDEPENDENT_AMBULATORY_CARE_PROVIDER_SITE_OTHER): Payer: Medicare PPO | Admitting: Physical Therapy

## 2023-03-09 DIAGNOSIS — R262 Difficulty in walking, not elsewhere classified: Secondary | ICD-10-CM | POA: Diagnosis not present

## 2023-03-09 DIAGNOSIS — M25551 Pain in right hip: Secondary | ICD-10-CM

## 2023-03-09 DIAGNOSIS — M6281 Muscle weakness (generalized): Secondary | ICD-10-CM

## 2023-03-09 NOTE — Therapy (Signed)
OUTPATIENT PHYSICAL THERAPY TREATMENT   Patient Name: Alisha Martinez MRN: [id number] DOB:26-Apr-1956, 67 y.o., female Today's Date: 03/04/2023  END OF SESSION:  PT End of Session - 03/04/23 1103     Visit Number 4    Number of Visits 20    Date for PT Re-Evaluation 05/03/23    Authorization Type [employer] $25 copay    PT Start Time 1100    PT Stop Time 1140    PT Time Calculation (min) 40 min    Activity Tolerance Patient tolerated treatment well    Behavior During Therapy WFL for tasks assessed/performed               Past Medical History:  Diagnosis Date   Arthritis    Asthma    Cancer ([place])    "cancerous cells found on pap smear- removed surgically"   Diabetes mellitus without complication ([place])    Fibromyalgia    Hypertension    MS (multiple sclerosis) ([place])    Sleep apnea    Past Surgical History:  Procedure Laterality Date   APPENDECTOMY     EYE SURGERY Bilateral 2023   "repaired torn retina"   KNEE SURGERY     TOTAL HIP ARTHROPLASTY Right 12/28/2022   Procedure: RIGHT TOTAL HIP ARTHROPLASTY ANTERIOR APPROACH;  Surgeon: Mcarthur Rossetti, MD;  Location: [facility];  Service: Orthopedics;  Laterality: Right;   VAGINA SURGERY     "pt states the doctor surgically removed cancerous cells from vaginal area"   Patient Active Problem List   Diagnosis Date Noted   Diabetes mellitus without complication ([place]) [id number]   Hypertension 12/31/2022   Asthma 12/31/2022   Sinus tachycardia 12/31/2022   Dyslipidemia 12/31/2022   OSA (obstructive sleep apnea) 12/31/2022   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   Other fatigue 11/22/2022   Multiple sclerosis ([place]) 10/26/2021   Pain in both lower extremities 10/26/2021   Urinary dysfunction 10/26/2021   Numbness 10/26/2021    PCP: None listed in epic  REFERRING PROVIDER: Mcarthur Rossetti, MD  REFERRING DIAG: [phone] (ICD-10-CM) - Status post total replacement of right hip  THERAPY DIAG:   Pain in right hip  Muscle weakness (generalized)  Difficulty in walking, not elsewhere classified  Rationale for Evaluation and Treatment: Rehabilitation  ONSET DATE: 12/28/2022 surgery  SUBJECTIVE:   SUBJECTIVE STATEMENT: Pt amb into clinic using her rollator walker. Pt reporting 4/10 pain when entering on lateral right hip.   PERTINENT HISTORY: PMH includes Multiple sclerosis, cancer, DM, HTN.  PAIN:  NPRS scale: 5/10 at rest, 8/10 with activities Pain location: Rt hip laterally/thigh Pain description: constant, burning  Aggravating factors: prolonged standing, walking, bending, squatting.  Relieving factors: rest  PRECAUTIONS: Anterior hip  WEIGHT BEARING RESTRICTIONS: No  FALLS:  Has patient fallen in last 6 months? No  LIVING ENVIRONMENT: Lives with: daughter, friend Lives in: House/apartment Stairs: 17 stairs with rail on Lt going up Has following equipment at home: Rollator and quad cane  OCCUPATION: Disability to retired  PLOF: Independent, line dancing (was limited by pain in hip prior to surgery), shopping, usually drives.   PATIENT GOALS: Reduce pain, get back to dancing, driving, walk independent.   OBJECTIVE:    PATIENT SURVEYS:  02/22/2023 FOTO intake:  43  predicted:  53  COGNITION: 02/22/2023 Overall cognitive status: WFL    SENSATION: 02/22/2023 [facility]  MUSCLE LENGTH: 02/22/2023 No specific measurements  PALPATION: 02/22/2023 Tenderness in lateral thigh musculature and greater trochanter, down lateral thigh  LOWER EXTREMITY ROM:   ROM Right 02/22/2023 Held for future assessment due to time RIght 02/24/2023  Hip flexion  100  Hip extension    Hip abduction  30  Hip adduction  10  Hip internal rotation    Hip external rotation    Knee flexion    Knee extension    Ankle dorsiflexion    Ankle plantarflexion    Ankle inversion    Ankle eversion     (Blank rows = not tested)  LOWER EXTREMITY MMT:  MMT in sitting  Right 02/24/2023   Hip flexion 3+   Hip extension    Hip abduction 3+   Hip adduction    Hip internal rotation    Hip external rotation    Knee flexion 4   Knee extension 4   Ankle dorsiflexion    Ankle plantarflexion    Ankle inversion    Ankle eversion     (Blank rows = not tested)  LOWER EXTREMITY SPECIAL TESTS:  02/22/2023 No specific testing today  FUNCTIONAL TESTS:  02/22/2023  TUG c rollator:  16.75 18 inch chair transfer: s UE assist with increased difficulty.  Lt SLS: unable  Rt SLS: unable    GAIT: 02/22/2023 Rollator use in clinic c forwared trunk lean, decreased gait speed, decreased step length with Rt LE, decrease stance.    TODAY'S TREATMENT                                                                           DATE: 03/09/23 TherEx: Nu-step L5 X 6 min UE/LE Standing hip abduction/ extension 2 X 10 each LE Side stepping around mat table c UE support c Level 2 band around knees Sit to stands no UE support x 10 Step up to 6" step x 15 each LE Supine bridges: x 10 holding 5 seconds Ball squeezes x 10 holding 5 sec Hamstring sets x 10 holding 5 sec each LE SAQ: x 15 each LE holding 5 sec Supine glute sets x 10 holding 10 sec Modalities:  Pt wishing to try moist heat this visit during supine activities.     03/04/23 Nu step L5 X 6 min UE/LE  Standing hip abduction/ extension/ flexion 2 X 10 bilat Standing hip marches X 10 bilat Seated hip adduction isometric ball squeeze 5 sec  X10 Seated clams red X 10 Sit to stands no UE support 2X5 Step up to 6" step 2x10, bilat  Cold pack to Rt hip X 8 min after session not included in treatment time.   02/28/23 Nu step L5 X 6 min UE/LE ( Pt requested after trying the recumbent bike for a min)  Standing hip abduction/ extension/ flexion X 10 bilat Standing hip marches X 10 bilat Seated hip adduction isometric ball squeeze 5 sec  X10 Seated clams red X 10 Sit to stands no UE support 2X5 Gait with quad cane  50 feet X 2, progressed to step through reciprocal pattern.  Supine ITB stretch 10 sec X 5 with strap PROM Rt hip flexion, adduction, IR,ER to tolerance Cold pack to Rt hip X 8 min after session not included in treatment time.       PATIENT EDUCATION:  02/22/2023  Education details: HEP, POC Person educated: Patient Education method: Explanation, Demonstration, Verbal cues, and Handouts Education comprehension: verbalized understanding, returned demonstration, and verbal cues required  HOME EXERCISE PROGRAM: Access Code: [email or web address] URL: [email or web address] Date: 02/22/2023 Prepared by: Scot Jun  Exercises - Seated Quad Set  - 2-3 x daily - 7 x weekly - 1 sets - 10 reps - 5 hold - Sit to Stand  - 3 x daily - 7 x weekly - 1 sets - 10 reps - Seated March  - 1-2 x daily - 7 x weekly - 1-2 sets - 10 reps  ASSESSMENT:  CLINICAL IMPRESSION: Pt arriving with 4/10 pain in her Rt lateral hip. Pt tolerating exercises well but reporting 8/10 pain following all standing activities. Pt with noted forward flexed posture during amb and standing exercises. Pt was able to demonstrate sit to stand with no UE support. Pt encouraged to continue her original HEP. Recommended continued skilled PT to maximize pt's function.      OBJECTIVE IMPAIRMENTS: Abnormal gait, decreased activity tolerance, decreased balance, decreased coordination, decreased endurance, decreased mobility, difficulty walking, decreased ROM, decreased strength, increased fascial restrictions, impaired perceived functional ability, impaired flexibility, improper body mechanics, and pain.   ACTIVITY LIMITATIONS: carrying, lifting, bending, sitting, standing, squatting, sleeping, stairs, transfers, and locomotion level  PARTICIPATION LIMITATIONS: meal prep, cleaning, laundry, interpersonal relationship, driving, shopping, and community activity  PERSONAL FACTORS:  PMH includes Multiple sclerosis, cancer, DM, HTN.   are also affecting patient's functional outcome.   REHAB POTENTIAL: Good  CLINICAL DECISION MAKING: Stable/uncomplicated  EVALUATION COMPLEXITY: Low   GOALS: Goals reviewed with patient? Yes  SHORT TERM GOALS: (target date for Short term goals are 3 weeks 03/15/2023)   1.  Patient will demonstrate independent use of home exercise program to maintain progress from in clinic treatments.  Goal status: MET 03/09/23  LONG TERM GOALS: (target dates for all long term goals are 10 weeks  05/03/2023 )   1. Patient will demonstrate/report pain at worst less than or equal to 2/10 to facilitate minimal limitation in daily activity secondary to pain symptoms.  Goal status: New   2. Patient will demonstrate independent use of home exercise program to facilitate ability to maintain/progress functional gains from skilled physical therapy services.  Goal status: New   3. Patient will demonstrate FOTO outcome > or = 53 % to indicate reduced disability due to condition.  Goal status: New   4.  Patient will demonstrate Rt LE MMT 5/5 throughout to faciltiate usual transfers, stairs, squatting at [facility] for daily life.   Goal status: New   5.  Patient will demonstrate ability to ascend and descend stairs reciprocally without UE assist for community /house integration.  Goal status: New   6.  Patient will demonstrate TUG < 14 to indicate reduce fall risk.  Goal status: New   7.  Patient will demonstrate independent ambulation community distances > 300 ft Goal Status: New   PLAN:  PT FREQUENCY: 1-2x/week  PT DURATION: 10 weeks  PLANNED INTERVENTIONS: Therapeutic exercises, Therapeutic activity, Neuro Muscular re-education, Balance training, Gait training, Patient/Family education, Joint mobilization, Stair training, DME instructions, Dry Needling, Electrical stimulation, Traction, Cryotherapy, vasopneumatic deviceMoist heat, Taping, Ultrasound, Ionotophoresis '4mg'$ /ml Dexamethasone, and Manual  therapy.  All included unless contraindicated  PLAN FOR NEXT SESSION: Review HEP knowledge/results.  Gross movement improvements for strength, ROM, gait   Kearney Hard, PT,MPT 03/09/23 8:04 AM   03/04/23  11:40 AM     Referring diagnosis? [id number] (ICD-10-CM) -  Status post total replacement of right hip Treatment diagnosis? (if different than referring diagnosis) M25.551 What was this (referring dx) caused by? '[x]'$  Surgery '[]'$  Fall '[]'$  Ongoing issue '[]'$  Arthritis '[]'$  Other: ____________  Laterality: '[x]'$  Rt '[]'$  Lt '[]'$  Both  Check all possible CPT codes:  *CHOOSE 10 OR LESS*    '[]'$  97110 (Therapeutic Exercise)  '[]'$  92507 (SLP Treatment)  '[]'$  97112 (Neuro Re-ed)   '[]'$  92526 (Swallowing Treatment)   '[]'$  97116 (Gait Training)   '[]'$  [id number] (Cognitive Training, 1st 15 minutes) '[]'$  97140 (Manual Therapy)   '[]'$  97130 (Cognitive Training, each add'l 15 minutes)  '[]'$  97164 (Re-evaluation)                              '[]'$  Other, List CPT Code ____________  '[]'$  97530 (Therapeutic Activities)     '[]'$  97535 (Self Care)   '[x]'$  All codes above (97110 - 97535)  '[]'$  97012 (Mechanical Traction)  '[x]'$  97014 (E-stim Unattended)  '[]'$  97032 (E-stim manual)  '[]'$  97033 (Ionto)  '[]'$  97035 (Ultrasound) '[x]'$  97750 (Physical Performance Training) '[]'$  [id number] (Aquatic Therapy) '[]'$  97016 (Vasopneumatic Device) '[]'$  [id number] (Paraffin) '[]'$  97034 (Contrast Bath) '[]'$  97597 (Wound Care 1st 20 sq cm) '[]'$  97598 (Wound Care each add'l 20 sq cm) '[]'$  97760 (Orthotic Fabrication, Fitting, Training Initial) '[]'$  55732 (Prosthetic Management and Training Initial) '[]'$  [id number] (Orthotic or Prosthetic Training/ Modification Subsequent)

## 2023-03-10 ENCOUNTER — Encounter: Payer: Medicare PPO | Admitting: Physical Therapy

## 2023-03-11 ENCOUNTER — Encounter: Payer: Self-pay | Admitting: Physical Therapy

## 2023-03-11 ENCOUNTER — Ambulatory Visit (INDEPENDENT_AMBULATORY_CARE_PROVIDER_SITE_OTHER): Payer: Medicare PPO | Admitting: Physical Therapy

## 2023-03-11 ENCOUNTER — Encounter: Payer: Medicare PPO | Admitting: Physical Therapy

## 2023-03-11 DIAGNOSIS — M6281 Muscle weakness (generalized): Secondary | ICD-10-CM | POA: Diagnosis not present

## 2023-03-11 DIAGNOSIS — R262 Difficulty in walking, not elsewhere classified: Secondary | ICD-10-CM

## 2023-03-11 DIAGNOSIS — M25551 Pain in right hip: Secondary | ICD-10-CM | POA: Diagnosis not present

## 2023-03-11 NOTE — Therapy (Signed)
OUTPATIENT PHYSICAL THERAPY TREATMENT   Patient Name: Alisha Martinez MRN: [id number] DOB:February 03, 1956, 67 y.o., female Today's Date: 03/11/2023  END OF SESSION:  PT End of Session - 03/11/23 1010     Visit Number 6    Number of Visits 20    Date for PT Re-Evaluation 05/03/23    Authorization Type [employer] $25 copay    PT Start Time 1010    PT Stop Time 1100    PT Time Calculation (min) 50 min    Activity Tolerance Patient tolerated treatment well    Behavior During Therapy WFL for tasks assessed/performed                Past Medical History:  Diagnosis Date   Arthritis    Asthma    Cancer ([place])    "cancerous cells found on pap smear- removed surgically"   Diabetes mellitus without complication ([place])    Fibromyalgia    Hypertension    MS (multiple sclerosis) ([place])    Sleep apnea    Past Surgical History:  Procedure Laterality Date   APPENDECTOMY     EYE SURGERY Bilateral 2023   "repaired torn retina"   KNEE SURGERY     TOTAL HIP ARTHROPLASTY Right 12/28/2022   Procedure: RIGHT TOTAL HIP ARTHROPLASTY ANTERIOR APPROACH;  Surgeon: Mcarthur Rossetti, MD;  Location: [facility];  Service: Orthopedics;  Laterality: Right;   VAGINA SURGERY     "pt states the doctor surgically removed cancerous cells from vaginal area"   Patient Active Problem List   Diagnosis Date Noted   Diabetes mellitus without complication ([place]) [id number]   Hypertension 12/31/2022   Asthma 12/31/2022   Sinus tachycardia 12/31/2022   Dyslipidemia 12/31/2022   OSA (obstructive sleep apnea) 12/31/2022   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   Other fatigue 11/22/2022   Multiple sclerosis ([place]) 10/26/2021   Pain in both lower extremities 10/26/2021   Urinary dysfunction 10/26/2021   Numbness 10/26/2021    PCP: None listed in epic  REFERRING PROVIDER: Mcarthur Rossetti, MD  REFERRING DIAG: [phone] (ICD-10-CM) - Status post total replacement of right hip  THERAPY DIAG:   Pain in right hip  Muscle weakness (generalized)  Difficulty in walking, not elsewhere classified  Rationale for Evaluation and Treatment: Rehabilitation  ONSET DATE: 12/28/2022 surgery  SUBJECTIVE:   SUBJECTIVE STATEMENT: Rt hip is stiff today; pain is up with exercies  PERTINENT HISTORY: PMH includes Multiple sclerosis, cancer, DM, HTN.  PAIN:  NPRS scale: 0/10 at rest, up to 7/10 with activities Pain location: Rt hip laterally/thigh Pain description: constant, burning  Aggravating factors: prolonged standing, walking, bending, squatting.  Relieving factors: rest  PRECAUTIONS: Anterior hip  WEIGHT BEARING RESTRICTIONS: No  FALLS:  Has patient fallen in last 6 months? No  LIVING ENVIRONMENT: Lives with: daughter, friend Lives in: House/apartment Stairs: 17 stairs with rail on Lt going up Has following equipment at home: Rollator and quad cane  OCCUPATION: Disability to retired  PLOF: Independent, line dancing (was limited by pain in hip prior to surgery), shopping, usually drives.   PATIENT GOALS: Reduce pain, get back to dancing, driving, walk independent.   OBJECTIVE:    PATIENT SURVEYS:  02/22/2023 FOTO intake:  43  predicted:  53  MUSCLE LENGTH: 02/22/2023 No specific measurements  PALPATION: 02/22/2023 Tenderness in lateral thigh musculature and greater trochanter, down lateral thigh  LOWER EXTREMITY ROM:   ROM Right 02/22/2023 Held for future assessment due to time RIght 02/24/2023  Hip  flexion  100  Hip extension    Hip abduction  30  Hip adduction  10  Hip internal rotation    Hip external rotation    Knee flexion    Knee extension    Ankle dorsiflexion    Ankle plantarflexion    Ankle inversion    Ankle eversion     (Blank rows = not tested)  LOWER EXTREMITY MMT:  MMT in sitting Right 02/24/2023   Hip flexion 3+   Hip extension    Hip abduction 3+   Hip adduction    Hip internal rotation    Hip external rotation    Knee  flexion 4   Knee extension 4   Ankle dorsiflexion    Ankle plantarflexion    Ankle inversion    Ankle eversion     (Blank rows = not tested)   FUNCTIONAL TESTS:  02/22/2023  TUG c rollator:  16.75 18 inch chair transfer: s UE assist with increased difficulty.  Lt SLS: unable  Rt SLS: unable    GAIT: 02/22/2023 Rollator use in clinic c forwared trunk lean, decreased gait speed, decreased step length with Rt LE, decrease stance.    TODAY'S TREATMENT  DATE: 03/11/23 TherEx: Nu-step L6 X 8 min UE/LE Hooklying marching on Rt 2 x 10 Bridges 2x10 Supine hip flexion from floor to mat x 10 Seated hip flexion/abduction 2x10 or Rt Sit to/from stand without UE support 2x10 Standing Rt hip flexion (SLR), abduction and extension 2x10 Squats with UE support 2x10  Ice x 10 min to Rt hip in sitting   03/09/23 TherEx: Nu-step L5 X 6 min UE/LE Standing hip abduction/ extension 2 X 10 each LE Side stepping around mat table c UE support c Level 2 band around knees Sit to stands no UE support x 10 Step up to 6" step x 15 each LE Supine bridges: x 10 holding 5 seconds Ball squeezes x 10 holding 5 sec Hamstring sets x 10 holding 5 sec each LE SAQ: x 15 each LE holding 5 sec Supine glute sets x 10 holding 10 sec  Modalities: Pt wishing to try moist heat this visit during supine activities.     03/04/23 Nu step L5 X 6 min UE/LE  Standing hip abduction/ extension/ flexion 2 X 10 bilat Standing hip marches X 10 bilat Seated hip adduction isometric ball squeeze 5 sec  X10 Seated clams red X 10 Sit to stands no UE support 2X5 Step up to 6" step 2x10, bilat  Cold pack to Rt hip X 8 min after session not included in treatment time.   02/28/23 Nu step L5 X 6 min UE/LE ( Pt requested after trying the recumbent bike for a min)  Standing hip abduction/ extension/ flexion X 10 bilat Standing hip marches X 10 bilat Seated hip adduction isometric ball squeeze 5 sec  X10 Seated clams red X  10 Sit to stands no UE support 2X5 Gait with quad cane 50 feet X 2, progressed to step through reciprocal pattern.  Supine ITB stretch 10 sec X 5 with strap PROM Rt hip flexion, adduction, IR,ER to tolerance Cold pack to Rt hip X 8 min after session not included in treatment time.       PATIENT EDUCATION:  02/22/2023 Education details: HEP, POC Person educated: Patient Education method: Explanation, Demonstration, Verbal cues, and Handouts Education comprehension: verbalized understanding, returned demonstration, and verbal cues required  HOME EXERCISE PROGRAM: Access Code: [email or web address] URL: [email or web address] Date: 02/22/2023 Prepared  by: Scot Jun  Exercises - Seated Quad Set  - 2-3 x daily - 7 x weekly - 1 sets - 10 reps - 5 hold - Sit to Stand  - 3 x daily - 7 x weekly - 1 sets - 10 reps - Seated March  - 1-2 x daily - 7 x weekly - 1-2 sets - 10 reps  ASSESSMENT:  CLINICAL IMPRESSION: Pt tolerated session well working on hip flexion activities today.  Overall progressing well but still has functional limitations due to weakness.   Will continue to benefit from PT to maximize function.  OBJECTIVE IMPAIRMENTS: Abnormal gait, decreased activity tolerance, decreased balance, decreased coordination, decreased endurance, decreased mobility, difficulty walking, decreased ROM, decreased strength, increased fascial restrictions, impaired perceived functional ability, impaired flexibility, improper body mechanics, and pain.   ACTIVITY LIMITATIONS: carrying, lifting, bending, sitting, standing, squatting, sleeping, stairs, transfers, and locomotion level  PARTICIPATION LIMITATIONS: meal prep, cleaning, laundry, interpersonal relationship, driving, shopping, and community activity  PERSONAL FACTORS:  PMH includes Multiple sclerosis, cancer, DM, HTN.  are also affecting patient's functional outcome.   REHAB POTENTIAL: Good  CLINICAL DECISION MAKING:  Stable/uncomplicated  EVALUATION COMPLEXITY: Low   GOALS: Goals reviewed with patient? Yes  SHORT TERM GOALS: (target date for Short term goals are 3 weeks 03/15/2023)   1.  Patient will demonstrate independent use of home exercise program to maintain progress from in clinic treatments.  Goal status: MET 03/09/23  LONG TERM GOALS: (target dates for all long term goals are 10 weeks  05/03/2023 )   1. Patient will demonstrate/report pain at worst less than or equal to 2/10 to facilitate minimal limitation in daily activity secondary to pain symptoms.  Goal status: New   2. Patient will demonstrate independent use of home exercise program to facilitate ability to maintain/progress functional gains from skilled physical therapy services.  Goal status: New   3. Patient will demonstrate FOTO outcome > or = 53 % to indicate reduced disability due to condition.  Goal status: New   4.  Patient will demonstrate Rt LE MMT 5/5 throughout to faciltiate usual transfers, stairs, squatting at [facility] for daily life.   Goal status: New   5.  Patient will demonstrate ability to ascend and descend stairs reciprocally without UE assist for community /house integration.  Goal status: New   6.  Patient will demonstrate TUG < 14 to indicate reduce fall risk.  Goal status: New   7.  Patient will demonstrate independent ambulation community distances > 300 ft Goal Status: New   PLAN:  PT FREQUENCY: 1-2x/week  PT DURATION: 10 weeks  PLANNED INTERVENTIONS: Therapeutic exercises, Therapeutic activity, Neuro Muscular re-education, Balance training, Gait training, Patient/Family education, Joint mobilization, Stair training, DME instructions, Dry Needling, Electrical stimulation, Traction, Cryotherapy, vasopneumatic deviceMoist heat, Taping, Ultrasound, Ionotophoresis 4mg /ml Dexamethasone, and Manual therapy.  All included unless contraindicated  PLAN FOR NEXT SESSION:  Review HEP knowledge/results.   Gross movement improvements for strength, ROM, gait; if she has cane work on gait training   Laureen Abrahams, PT, DPT 03/11/23 10:53 AM

## 2023-03-14 ENCOUNTER — Encounter: Payer: Self-pay | Admitting: Physical Therapy

## 2023-03-18 ENCOUNTER — Ambulatory Visit (INDEPENDENT_AMBULATORY_CARE_PROVIDER_SITE_OTHER): Payer: Medicare PPO | Admitting: Physical Therapy

## 2023-03-18 ENCOUNTER — Encounter: Payer: Self-pay | Admitting: Physical Therapy

## 2023-03-18 DIAGNOSIS — M25551 Pain in right hip: Secondary | ICD-10-CM

## 2023-03-18 DIAGNOSIS — R262 Difficulty in walking, not elsewhere classified: Secondary | ICD-10-CM

## 2023-03-18 DIAGNOSIS — M6281 Muscle weakness (generalized): Secondary | ICD-10-CM

## 2023-03-18 NOTE — Therapy (Signed)
OUTPATIENT PHYSICAL THERAPY TREATMENT   Patient Name: Alisha Martinez MRN: [id number] DOB:[birth year], 67 y.o., female Today's Date: 03/18/2023  END OF SESSION:  PT End of Session - 03/18/23 1110     Visit Number 7    Number of Visits 20    Date for PT Re-Evaluation 05/03/23    Authorization Type [employer] $25 copay    PT Start Time 0930    PT Stop Time 1013    PT Time Calculation (min) 43 min    Activity Tolerance Patient tolerated treatment well    Behavior During Therapy WFL for tasks assessed/performed                 Past Medical History:  Diagnosis Date   Arthritis    Asthma    Cancer ([place])    "cancerous cells found on pap smear- removed surgically"   Diabetes mellitus without complication ([place])    Fibromyalgia    Hypertension    MS (multiple sclerosis) ([place])    Sleep apnea    Past Surgical History:  Procedure Laterality Date   APPENDECTOMY     EYE SURGERY Bilateral 2023   "repaired torn retina"   KNEE SURGERY     TOTAL HIP ARTHROPLASTY Right 12/28/2022   Procedure: RIGHT TOTAL HIP ARTHROPLASTY ANTERIOR APPROACH;  Surgeon: Mcarthur Rossetti, MD;  Location: [facility];  Service: Orthopedics;  Laterality: Right;   VAGINA SURGERY     "pt states the doctor surgically removed cancerous cells from vaginal area"   Patient Active Problem List   Diagnosis Date Noted   Diabetes mellitus without complication ([place]) [id number]   Hypertension 12/31/2022   Asthma 12/31/2022   Sinus tachycardia 12/31/2022   Dyslipidemia 12/31/2022   OSA (obstructive sleep apnea) 12/31/2022   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   Other fatigue 11/22/2022   Multiple sclerosis ([place]) 10/26/2021   Pain in both lower extremities 10/26/2021   Urinary dysfunction 10/26/2021   Numbness 10/26/2021    PCP: None listed in epic  REFERRING PROVIDER: Mcarthur Rossetti, MD  REFERRING DIAG: [phone] (ICD-10-CM) - Status post total replacement of right hip  THERAPY DIAG:   Pain in right hip  Muscle weakness (generalized)  Difficulty in walking, not elsewhere classified  Rationale for Evaluation and Treatment: Rehabilitation  ONSET DATE: 12/28/2022 surgery  SUBJECTIVE:   SUBJECTIVE STATEMENT: She relays tired from the walk in today to get to the pool, she has done some water classes before but it has been a while  PERTINENT HISTORY: PMH includes Multiple sclerosis, cancer, DM, HTN.  PAIN:  NPRS scale: 4/10  Pain location: Rt hip laterally/thigh Pain description: constant, burning  Aggravating factors: prolonged standing, walking, bending, squatting.  Relieving factors: rest  PRECAUTIONS: Anterior hip  WEIGHT BEARING RESTRICTIONS: No  FALLS:  Has patient fallen in last 6 months? No  LIVING ENVIRONMENT: Lives with: daughter, friend Lives in: House/apartment Stairs: 17 stairs with rail on Lt going up Has following equipment at home: Rollator and quad cane  OCCUPATION: Disability to retired  PLOF: Independent, line dancing (was limited by pain in hip prior to surgery), shopping, usually drives.   PATIENT GOALS: Reduce pain, get back to dancing, driving, walk independent.   OBJECTIVE:    PATIENT SURVEYS:  02/22/2023 FOTO intake:  43  predicted:  53  MUSCLE LENGTH: 02/22/2023 No specific measurements  PALPATION: 02/22/2023 Tenderness in lateral thigh musculature and greater trochanter, down lateral thigh  LOWER EXTREMITY ROM:   ROM Right 02/22/2023  Held for future assessment due to time RIght 02/24/2023  Hip flexion  100  Hip extension    Hip abduction  30  Hip adduction  10  Hip internal rotation    Hip external rotation    Knee flexion    Knee extension    Ankle dorsiflexion    Ankle plantarflexion    Ankle inversion    Ankle eversion     (Blank rows = not tested)  LOWER EXTREMITY MMT:  MMT in sitting Right 02/24/2023   Hip flexion 3+   Hip extension    Hip abduction 3+   Hip adduction    Hip internal rotation     Hip external rotation    Knee flexion 4   Knee extension 4   Ankle dorsiflexion    Ankle plantarflexion    Ankle inversion    Ankle eversion     (Blank rows = not tested)   FUNCTIONAL TESTS:  02/22/2023  TUG c rollator:  16.75 18 inch chair transfer: s UE assist with increased difficulty.  Lt SLS: unable  Rt SLS: unable    GAIT: 02/22/2023 Rollator use in clinic c forwared trunk lean, decreased gait speed, decreased step length with Rt LE, decrease stance.    TODAY'S TREATMENT  DATE: 03/18/23 Pt seen for aquatic therapy today.  Treatment took place in water 3.5-4.75 ft in depth at the [facility] pool. Temp of water was 91.  Pt entered/exited the pool via steps with both rails.  Pt requires the buoyancy and hydrostatic pressure of water for support, and to offload joints by unweighting joint load by at least 50 % in navel deep water and by at least 75-80% in chest to neck deep water.  Viscosity of the water is needed for resistance of strengthening. Water current perturbations provides challenge to standing balance requiring increased core activation.  Lateral walking 8 feet X 10 with bilat UE support (waist to shoulder dept) Retro walking 8 feet X 10 with one UE support (waist to shoulder dept)  Standing hip extensions X 15 bilat with UE support Standing hamstring curls X 15 bilat with UE support  Seated on bench in water:  knee extensions X 15 bilat Shoulder extensions with pool dumbells X 15 bilat Shoulder horizontal abd/add with pool dumbells X 15 bilat Sit to stands with feet on 5 inch step X 10  Step ups onto first step of pool using bilat hand rails X 10 bilat Walking with pool noodle across 3.6 feet 3 round trips Standing leg swings X 15 bilat add/abd Standing marches  X 15 bilat Mini squats using pool noodle support X 10 Stepping with opposite arm chest press with pool noodle X 10   Attempted spinal decompression with arms over pool noodle and  float but she was apprehensive about this    03/11/23 TherEx: Nu-step L6 X 8 min UE/LE Hooklying marching on Rt 2 x 10 Bridges 2x10 Supine hip flexion from floor to mat x 10 Seated hip flexion/abduction 2x10 or Rt Sit to/from stand without UE support 2x10 Standing Rt hip flexion (SLR), abduction and extension 2x10 Squats with UE support 2x10  Ice x 10 min to Rt hip in sitting   03/09/23 TherEx: Nu-step L5 X 6 min UE/LE Standing hip abduction/ extension 2 X 10 each LE Side stepping around mat table c UE support c Level 2 band around knees Sit to stands no UE support x 10 Step up to 6" step x 15 each LE  Supine bridges: x 10 holding 5 seconds Ball squeezes x 10 holding 5 sec Hamstring sets x 10 holding 5 sec each LE SAQ: x 15 each LE holding 5 sec Supine glute sets x 10 holding 10 sec  Modalities: Pt wishing to try moist heat this visit during supine activities.     03/04/23 Nu step L5 X 6 min UE/LE  Standing hip abduction/ extension/ flexion 2 X 10 bilat Standing hip marches X 10 bilat Seated hip adduction isometric ball squeeze 5 sec  X10 Seated clams red X 10 Sit to stands no UE support 2X5 Step up to 6" step 2x10, bilat  Cold pack to Rt hip X 8 min after session not included in treatment time.   02/28/23 Nu step L5 X 6 min UE/LE ( Pt requested after trying the recumbent bike for a min)  Standing hip abduction/ extension/ flexion X 10 bilat Standing hip marches X 10 bilat Seated hip adduction isometric ball squeeze 5 sec  X10 Seated clams red X 10 Sit to stands no UE support 2X5 Gait with quad cane 50 feet X 2, progressed to step through reciprocal pattern.  Supine ITB stretch 10 sec X 5 with strap PROM Rt hip flexion, adduction, IR,ER to tolerance Cold pack to Rt hip X 8 min after session not included in treatment time.       PATIENT EDUCATION:  02/22/2023 Education details: HEP, POC Person educated: Patient Education method: Explanation, Demonstration,  Verbal cues, and Handouts Education comprehension: verbalized understanding, returned demonstration, and verbal cues required  HOME EXERCISE PROGRAM: Access Code: [email or web address] URL: [email or web address] Date: 02/22/2023 Prepared by: Scot Jun  Exercises - Seated Quad Set  - 2-3 x daily - 7 x weekly - 1 sets - 10 reps - 5 hold - Sit to Stand  - 3 x daily - 7 x weekly - 1 sets - 10 reps - Seated March  - 1-2 x daily - 7 x weekly - 1-2 sets - 10 reps  ASSESSMENT:  CLINICAL IMPRESSION: She began her first aquatic PT session today. She had good overall tolerance to this without any significant complaints. We will assess her response to this and she can set up future aquatic sessions if desired.   OBJECTIVE IMPAIRMENTS: Abnormal gait, decreased activity tolerance, decreased balance, decreased coordination, decreased endurance, decreased mobility, difficulty walking, decreased ROM, decreased strength, increased fascial restrictions, impaired perceived functional ability, impaired flexibility, improper body mechanics, and pain.   ACTIVITY LIMITATIONS: carrying, lifting, bending, sitting, standing, squatting, sleeping, stairs, transfers, and locomotion level  PARTICIPATION LIMITATIONS: meal prep, cleaning, laundry, interpersonal relationship, driving, shopping, and community activity  PERSONAL FACTORS:  PMH includes Multiple sclerosis, cancer, DM, HTN.  are also affecting patient's functional outcome.   REHAB POTENTIAL: Good  CLINICAL DECISION MAKING: Stable/uncomplicated  EVALUATION COMPLEXITY: Low   GOALS: Goals reviewed with patient? Yes  SHORT TERM GOALS: (target date for Short term goals are 3 weeks 03/15/2023)   1.  Patient will demonstrate independent use of home exercise program to maintain progress from in clinic treatments.  Goal status: MET 03/09/23  LONG TERM GOALS: (target dates for all long term goals are 10 weeks  05/03/2023 )   1. Patient will  demonstrate/report pain at worst less than or equal to 2/10 to facilitate minimal limitation in daily activity secondary to pain symptoms.  Goal status: New   2. Patient will demonstrate independent use of home exercise program to facilitate ability to maintain/progress functional gains from skilled physical therapy  services.  Goal status: New   3. Patient will demonstrate FOTO outcome > or = 53 % to indicate reduced disability due to condition.  Goal status: New   4.  Patient will demonstrate Rt LE MMT 5/5 throughout to faciltiate usual transfers, stairs, squatting at [facility] for daily life.   Goal status: New   5.  Patient will demonstrate ability to ascend and descend stairs reciprocally without UE assist for community /house integration.  Goal status: New   6.  Patient will demonstrate TUG < 14 to indicate reduce fall risk.  Goal status: New   7.  Patient will demonstrate independent ambulation community distances > 300 ft Goal Status: New   PLAN:  PT FREQUENCY: 1-2x/week  PT DURATION: 10 weeks  PLANNED INTERVENTIONS: Therapeutic exercises, Therapeutic activity, Neuro Muscular re-education, Balance training, Gait training, Patient/Family education, Joint mobilization, Stair training, DME instructions, Dry Needling, Electrical stimulation, Traction, Cryotherapy, vasopneumatic deviceMoist heat, Taping, Ultrasound, Ionotophoresis 4mg /ml Dexamethasone, and Manual therapy.  All included unless contraindicated  PLAN FOR NEXT SESSION:  Review HEP knowledge/results.  Gross movement improvements for strength, ROM, gait; if she has cane work on Personnel officer. Aquatic PT if desired.   Elsie Ra, PT, DPT 03/18/23 11:10 AM

## 2023-03-23 ENCOUNTER — Ambulatory Visit (INDEPENDENT_AMBULATORY_CARE_PROVIDER_SITE_OTHER): Payer: Medicare PPO | Admitting: Physical Therapy

## 2023-03-23 ENCOUNTER — Encounter: Payer: Self-pay | Admitting: Physical Therapy

## 2023-03-23 DIAGNOSIS — M25551 Pain in right hip: Secondary | ICD-10-CM

## 2023-03-23 DIAGNOSIS — R262 Difficulty in walking, not elsewhere classified: Secondary | ICD-10-CM

## 2023-03-23 DIAGNOSIS — M6281 Muscle weakness (generalized): Secondary | ICD-10-CM | POA: Diagnosis not present

## 2023-03-23 NOTE — Therapy (Signed)
OUTPATIENT PHYSICAL THERAPY TREATMENT   Patient Name: Alisha Martinez MRN: [id number] DOB:[birth year], 67 y.o., female Today's Date: 03/23/2023  END OF SESSION:  PT End of Session - 03/23/23 0933     Visit Number 8    Number of Visits 20    Date for PT Re-Evaluation 05/03/23    Authorization Type [employer] $25 copay    PT Start Time 0931    PT Stop Time 1010    PT Time Calculation (min) 39 min    Activity Tolerance Patient tolerated treatment well    Behavior During Therapy WFL for tasks assessed/performed                 Past Medical History:  Diagnosis Date   Arthritis    Asthma    Cancer ([place])    "cancerous cells found on pap smear- removed surgically"   Diabetes mellitus without complication ([place])    Fibromyalgia    Hypertension    MS (multiple sclerosis) ([place])    Sleep apnea    Past Surgical History:  Procedure Laterality Date   APPENDECTOMY     EYE SURGERY Bilateral 2023   "repaired torn retina"   KNEE SURGERY     TOTAL HIP ARTHROPLASTY Right 12/28/2022   Procedure: RIGHT TOTAL HIP ARTHROPLASTY ANTERIOR APPROACH;  Surgeon: Mcarthur Rossetti, MD;  Location: [facility];  Service: Orthopedics;  Laterality: Right;   VAGINA SURGERY     "pt states the doctor surgically removed cancerous cells from vaginal area"   Patient Active Problem List   Diagnosis Date Noted   Diabetes mellitus without complication ([place]) [id number]   Hypertension 12/31/2022   Asthma 12/31/2022   Sinus tachycardia 12/31/2022   Dyslipidemia 12/31/2022   OSA (obstructive sleep apnea) 12/31/2022   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   Other fatigue 11/22/2022   Multiple sclerosis ([place]) 10/26/2021   Pain in both lower extremities 10/26/2021   Urinary dysfunction 10/26/2021   Numbness 10/26/2021    PCP: None listed in epic  REFERRING PROVIDER: Mcarthur Rossetti, MD  REFERRING DIAG: [phone] (ICD-10-CM) - Status post total replacement of right hip  THERAPY DIAG:   Pain in right hip  Muscle weakness (generalized)  Difficulty in walking, not elsewhere classified  Rationale for Evaluation and Treatment: Rehabilitation  ONSET DATE: 12/28/2022 surgery  SUBJECTIVE:   SUBJECTIVE STATEMENT: She relays tired from the pool that day but not too sore then next day. She relays pain is 2-6/10 today in Rt hip  PERTINENT HISTORY: PMH includes Multiple sclerosis, cancer, DM, HTN.  PAIN:  NPRS scale: see above Pain location: Rt hip laterally/thigh Pain description: constant, burning  Aggravating factors: prolonged standing, walking, bending, squatting.  Relieving factors: rest  PRECAUTIONS: Anterior hip  WEIGHT BEARING RESTRICTIONS: No  FALLS:  Has patient fallen in last 6 months? No  LIVING ENVIRONMENT: Lives with: daughter, friend Lives in: House/apartment Stairs: 17 stairs with rail on Lt going up Has following equipment at home: Rollator and quad cane  OCCUPATION: Disability to retired  PLOF: Independent, line dancing (was limited by pain in hip prior to surgery), shopping, usually drives.   PATIENT GOALS: Reduce pain, get back to dancing, driving, walk independent.   OBJECTIVE:    PATIENT SURVEYS:  02/22/2023 FOTO intake:  43  predicted:  53  MUSCLE LENGTH: 02/22/2023 No specific measurements  PALPATION: 02/22/2023 Tenderness in lateral thigh musculature and greater trochanter, down lateral thigh  LOWER EXTREMITY ROM:   ROM Right 02/22/2023 Held for  future assessment due to time RIght 02/24/2023  Hip flexion  100  Hip extension    Hip abduction  30  Hip adduction  10  Hip internal rotation    Hip external rotation    Knee flexion    Knee extension    Ankle dorsiflexion    Ankle plantarflexion    Ankle inversion    Ankle eversion     (Blank rows = not tested)  LOWER EXTREMITY MMT:  MMT in sitting Right 02/24/2023 Right 03/23/23   Hip flexion 3+ 4-   Hip extension     Hip abduction 3+ 4-   Hip adduction     Hip  internal rotation     Hip external rotation     Knee flexion 4 4+   Knee extension 4 4+   Ankle dorsiflexion     Ankle plantarflexion     Ankle inversion     Ankle eversion      (Blank rows = not tested)   FUNCTIONAL TESTS:  02/22/2023  TUG c rollator:  16.75 18 inch chair transfer: s UE assist with increased difficulty.  Lt SLS: unable  Rt SLS: unable    GAIT: 02/22/2023 Rollator use in clinic c forwared trunk lean, decreased gait speed, decreased step length with Rt LE, decrease stance.    TODAY'S TREATMENT  DATE: 02/2723 TherEx: Nu-step L6 X 8 min UE/LE Sidestepping at counter top with UE support 3 round trips Standing hip abd red X 15 bilat Standing hip extension red X 15 bilat Seated LAQ 3# X 15 bilat Standing marches 3# X 15 bilat Seated hip adduction ball squeeze 5 sec X 15 Seated clams green  X15 Step ups using bilat UE support 4 inch step leading Rt leg X 10 fwd and X 10 latreal Leg press DL 56# 2X10, then SL 25# 2X10 on Rt  Ice x 8 min to Rt hip in sitting  03/18/23 Pt seen for aquatic therapy today.  Treatment took place in water 3.5-4.75 ft in depth at the [facility] pool. Temp of water was 91.  Pt entered/exited the pool via steps with both rails.  Pt requires the buoyancy and hydrostatic pressure of water for support, and to offload joints by unweighting joint load by at least 50 % in navel deep water and by at least 75-80% in chest to neck deep water.  Viscosity of the water is needed for resistance of strengthening. Water current perturbations provides challenge to standing balance requiring increased core activation.  Lateral walking 8 feet X 10 with bilat UE support (waist to shoulder dept) Retro walking 8 feet X 10 with one UE support (waist to shoulder dept)  Standing hip extensions X 15 bilat with UE support Standing hamstring curls X 15 bilat with UE support  Seated on bench in water:  knee extensions X 15 bilat Shoulder extensions  with pool dumbells X 15 bilat Shoulder horizontal abd/add with pool dumbells X 15 bilat Sit to stands with feet on 5 inch step X 10  Step ups onto first step of pool using bilat hand rails X 10 bilat Walking with pool noodle across 3.6 feet 3 round trips Standing leg swings X 15 bilat add/abd Standing marches  X 15 bilat Mini squats using pool noodle support X 10 Stepping with opposite arm chest press with pool noodle X 10   Attempted spinal decompression with arms over pool noodle and float but she was apprehensive about this     PATIENT  EDUCATION:  02/22/2023 Education details: HEP, POC Person educated: Patient Education method: Explanation, Demonstration, Verbal cues, and Handouts Education comprehension: verbalized understanding, returned demonstration, and verbal cues required  HOME EXERCISE PROGRAM: Access Code: [email or web address] URL: [email or web address] Date: 02/22/2023 Prepared by: Scot Jun  Exercises - Seated Quad Set  - 2-3 x daily - 7 x weekly - 1 sets - 10 reps - 5 hold - Sit to Stand  - 3 x daily - 7 x weekly - 1 sets - 10 reps - Seated March  - 1-2 x daily - 7 x weekly - 1-2 sets - 10 reps  ASSESSMENT:  CLINICAL IMPRESSION: She has weakness noted in left hip but this is improving with PT. I progressed her strengthening program today with fair to good tolerance overall to this, she does request ice on her hip at the end. We will see her for another aquatic session next.   OBJECTIVE IMPAIRMENTS: Abnormal gait, decreased activity tolerance, decreased balance, decreased coordination, decreased endurance, decreased mobility, difficulty walking, decreased ROM, decreased strength, increased fascial restrictions, impaired perceived functional ability, impaired flexibility, improper body mechanics, and pain.   ACTIVITY LIMITATIONS: carrying, lifting, bending, sitting, standing, squatting, sleeping, stairs, transfers, and locomotion level  PARTICIPATION  LIMITATIONS: meal prep, cleaning, laundry, interpersonal relationship, driving, shopping, and community activity  PERSONAL FACTORS:  PMH includes Multiple sclerosis, cancer, DM, HTN.  are also affecting patient's functional outcome.   REHAB POTENTIAL: Good  CLINICAL DECISION MAKING: Stable/uncomplicated  EVALUATION COMPLEXITY: Low   GOALS: Goals reviewed with patient? Yes  SHORT TERM GOALS: (target date for Short term goals are 3 weeks 03/15/2023)   1.  Patient will demonstrate independent use of home exercise program to maintain progress from in clinic treatments.  Goal status: MET 03/09/23  LONG TERM GOALS: (target dates for all long term goals are 10 weeks  05/03/2023 )   1. Patient will demonstrate/report pain at worst less than or equal to 2/10 to facilitate minimal limitation in daily activity secondary to pain symptoms.  Goal status: New   2. Patient will demonstrate independent use of home exercise program to facilitate ability to maintain/progress functional gains from skilled physical therapy services.  Goal status: New   3. Patient will demonstrate FOTO outcome > or = 53 % to indicate reduced disability due to condition.  Goal status: New   4.  Patient will demonstrate Rt LE MMT 5/5 throughout to faciltiate usual transfers, stairs, squatting at [facility] for daily life.   Goal status: New   5.  Patient will demonstrate ability to ascend and descend stairs reciprocally without UE assist for community /house integration.  Goal status: New   6.  Patient will demonstrate TUG < 14 to indicate reduce fall risk.  Goal status: New   7.  Patient will demonstrate independent ambulation community distances > 300 ft Goal Status: New   PLAN:  PT FREQUENCY: 1-2x/week  PT DURATION: 10 weeks  PLANNED INTERVENTIONS: Therapeutic exercises, Therapeutic activity, Neuro Muscular re-education, Balance training, Gait training, Patient/Family education, Joint mobilization, Stair  training, DME instructions, Dry Needling, Electrical stimulation, Traction, Cryotherapy, vasopneumatic deviceMoist heat, Taping, Ultrasound, Ionotophoresis 4mg /ml Dexamethasone, and Manual therapy.  All included unless contraindicated  PLAN FOR NEXT SESSION:  Review HEP knowledge/results.  Gross movement improvements for strength, ROM, gait; if she has cane work on Personnel officer. Aquatic PT if desired.   Elsie Ra, PT, DPT 03/23/23 9:34 AM

## 2023-03-30 ENCOUNTER — Encounter: Payer: Self-pay | Admitting: Orthopaedic Surgery

## 2023-03-30 ENCOUNTER — Ambulatory Visit (INDEPENDENT_AMBULATORY_CARE_PROVIDER_SITE_OTHER): Payer: Medicare PPO | Admitting: Orthopaedic Surgery

## 2023-03-30 DIAGNOSIS — M24662 Ankylosis, left knee: Secondary | ICD-10-CM

## 2023-03-30 DIAGNOSIS — Z96652 Presence of left artificial knee joint: Secondary | ICD-10-CM

## 2023-03-30 NOTE — Progress Notes (Signed)
HPI: [name] returns today for follow-up of her right total hip arthroplasty which was performed approximately 13 weeks ago.  She feels physical therapy has definitely helped a lot.  She is having no pain in the hip.  She is walking with a walker.  She states overall she is doing well.  Review of systems: See HPI otherwise negative  Physical exam: General developed well-nourished female no acute distress ambulates with a walker. Right hip good range of motion.  No pain with internal/external rotation.  Right calf supple nontender dorsiflexion plantarflexion right ankle intact.  Impression: Status post right total hip arthroplasty  Plan: Follow-up in approximately 4 months.  At that point time we will obtain an AP pelvis and lateral view of the right hip.  She will follow-up with [place] sooner if there is any questions concerns.  Continue exercises as shown by therapy.  Questions were encouraged and answered at length.

## 2023-04-01 ENCOUNTER — Encounter: Payer: Self-pay | Admitting: Physical Therapy

## 2023-04-01 ENCOUNTER — Ambulatory Visit (INDEPENDENT_AMBULATORY_CARE_PROVIDER_SITE_OTHER): Payer: Medicare PPO | Admitting: Physical Therapy

## 2023-04-01 DIAGNOSIS — R262 Difficulty in walking, not elsewhere classified: Secondary | ICD-10-CM | POA: Diagnosis not present

## 2023-04-01 DIAGNOSIS — M25551 Pain in right hip: Secondary | ICD-10-CM

## 2023-04-01 DIAGNOSIS — M6281 Muscle weakness (generalized): Secondary | ICD-10-CM | POA: Diagnosis not present

## 2023-04-01 NOTE — Therapy (Signed)
OUTPATIENT PHYSICAL THERAPY TREATMENT   Patient Name: Alisha SaxonJanet Martinez MRN: [id number] DOB:[birth year], 67 y.o., female Today's Date: 04/01/2023  END OF SESSION:  PT End of Session - 04/01/23 1032     Visit Number 9    Number of Visits 20    Date for PT Re-Evaluation 05/03/23    Authorization Type [employer] $25 copay    PT Start Time 1030    PT Stop Time 1100    PT Time Calculation (min) 30 min    Activity Tolerance Patient tolerated treatment well    Behavior During Therapy WFL for tasks assessed/performed                 Past Medical History:  Diagnosis Date   Arthritis    Asthma    Cancer    "cancerous cells found on pap smear- removed surgically"   Diabetes mellitus without complication    Fibromyalgia    Hypertension    MS (multiple sclerosis)    Sleep apnea    Past Surgical History:  Procedure Laterality Date   APPENDECTOMY     EYE SURGERY Bilateral 2023   "repaired torn retina"   KNEE SURGERY     TOTAL HIP ARTHROPLASTY Right 12/28/2022   Procedure: RIGHT TOTAL HIP ARTHROPLASTY ANTERIOR APPROACH;  Surgeon: Kathryne HitchBlackman, Christopher Y, MD;  Location: [facility];  Service: Orthopedics;  Laterality: Right;   VAGINA SURGERY     "pt states the doctor surgically removed cancerous cells from vaginal area"   Patient Active Problem List   Diagnosis Date Noted   Diabetes mellitus without complication 12/31/2022   Hypertension 12/31/2022   Asthma 12/31/2022   Sinus tachycardia 12/31/2022   Dyslipidemia 12/31/2022   OSA (obstructive sleep apnea) 12/31/2022   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   Other fatigue 11/22/2022   Multiple sclerosis 10/26/2021   Pain in both lower extremities 10/26/2021   Urinary dysfunction 10/26/2021   Numbness 10/26/2021    PCP: None listed in epic  REFERRING PROVIDER: Kathryne HitchBlackman, Christopher Y, MD  REFERRING DIAG: [phone] (ICD-10-CM) - Status post total replacement of right hip  THERAPY DIAG:  Pain in right hip  Muscle  weakness (generalized)  Difficulty in walking, not elsewhere classified  Rationale for Evaluation and Treatment: Rehabilitation  ONSET DATE: 12/28/2022 surgery  SUBJECTIVE:   SUBJECTIVE STATEMENT: She relays arrives late today as the taxi service did not pick her up in time  PERTINENT HISTORY: PMH includes Multiple sclerosis, cancer, DM, HTN.  PAIN:  NPRS scale: see above Pain location: Rt hip laterally/thigh Pain description: constant, burning  Aggravating factors: prolonged standing, walking, bending, squatting.  Relieving factors: rest  PRECAUTIONS: Anterior hip  WEIGHT BEARING RESTRICTIONS: No  FALLS:  Has patient fallen in last 6 months? No  LIVING ENVIRONMENT: Lives with: daughter, friend Lives in: House/apartment Stairs: 17 stairs with rail on Lt going up Has following equipment at home: Rollator and quad cane  OCCUPATION: Disability to retired  PLOF: Independent, line dancing (was limited by pain in hip prior to surgery), shopping, usually drives.   PATIENT GOALS: Reduce pain, get back to dancing, driving, walk independent.   OBJECTIVE:    PATIENT SURVEYS:  02/22/2023 FOTO intake:  43  predicted:  53  MUSCLE LENGTH: 02/22/2023 No specific measurements  PALPATION: 02/22/2023 Tenderness in lateral thigh musculature and greater trochanter, down lateral thigh  LOWER EXTREMITY ROM:   ROM Right 02/22/2023 Held for future assessment due to time RIght 02/24/2023  Hip flexion  100  Hip extension    Hip abduction  30  Hip adduction  10  Hip internal rotation    Hip external rotation    Knee flexion    Knee extension    Ankle dorsiflexion    Ankle plantarflexion    Ankle inversion    Ankle eversion     (Blank rows = not tested)  LOWER EXTREMITY MMT:  MMT in sitting Right 02/24/2023 Right 03/23/23   Hip flexion 3+ 4-   Hip extension     Hip abduction 3+ 4-   Hip adduction     Hip internal rotation     Hip external rotation     Knee flexion 4  4+   Knee extension 4 4+   Ankle dorsiflexion     Ankle plantarflexion     Ankle inversion     Ankle eversion      (Blank rows = not tested)   FUNCTIONAL TESTS:  02/22/2023  TUG c rollator:  16.75 18 inch chair transfer: s UE assist with increased difficulty.  Lt SLS: unable  Rt SLS: unable    GAIT: 02/22/2023 Rollator use in clinic c forwared trunk lean, decreased gait speed, decreased step length with Rt LE, decrease stance.    TODAY'S TREATMENT  DATE: 04/01/23 Pt seen for aquatic therapy today.  Treatment took place in water 3.5-4.75 ft in depth at the [facility] pool. Temp of water was 91.  Pt entered/exited the pool via steps with both rails.  Pt requires the buoyancy and hydrostatic pressure of water for support, and to offload joints by unweighting joint load by at least 50 % in navel deep water and by at least 75-80% in chest to neck deep water.  Viscosity of the water is needed for resistance of strengthening. Water current perturbations provides challenge to standing balance requiring increased core activation.  Lateral walking 8 feet X 6 with bilat UE support (waist to shoulder dept) Fwd walking 8 feet X 6 with bilat UE support (waist to shoulder dept) Retro walking 8 feet X 6 with one UE support (waist to shoulder dept) Standing hip abductions/adduction swings with bilat UE support X 20 reps Standing hip flexion/extensions X 20 bilat with UE support Standing marches X 20 with bilat UE suppprt Standing hamstring curls X 20 bilat with UE support Forward walking horizontal width of pool 30 feet X 3 holding noodle at isometric 90 deg shoulder flexion Push and pull kickboard with UE X 20 reps holding mini squat Shoulder extensions with pool dumbells X 15 bilat Step ups onto first step of pool using one hand rails X 10 bilat Walking with pool noodle across 3.6 feet 3 round trips Standing leg swings X 15 bilat add/abd Standing marches  X 15  bilat   02/2723 TherEx: Nu-step L6 X 8 min UE/LE Sidestepping at counter top with UE support 3 round trips Standing hip abd red X 15 bilat Standing hip extension red X 15 bilat Seated LAQ 3# X 15 bilat Standing marches 3# X 15 bilat Seated hip adduction ball squeeze 5 sec X 15 Seated clams green  X15 Step ups using bilat UE support 4 inch step leading Rt leg X 10 fwd and X 10 latreal Leg press DL 22# 2L79, then SL 89# 2J19 on Rt  Ice x 8 min to Rt hip in sitting     Attempted spinal decompression with arms over pool noodle and float but she was apprehensive about this     PATIENT  EDUCATION:  02/22/2023 Education details: HEP, POC Person educated: Patient Education method: Explanation, Demonstration, Verbal cues, and Handouts Education comprehension: verbalized understanding, returned demonstration, and verbal cues required  HOME EXERCISE PROGRAM: Access Code: [email or web address] URL: [email or web address] Date: 02/22/2023 Prepared by: Chyrel Masson  Exercises - Seated Quad Set  - 2-3 x daily - 7 x weekly - 1 sets - 10 reps - 5 hold - Sit to Stand  - 3 x daily - 7 x weekly - 1 sets - 10 reps - Seated March  - 1-2 x daily - 7 x weekly - 1-2 sets - 10 reps  ASSESSMENT:  CLINICAL IMPRESSION: Shorter session in pool today as the taxi was late bringing her to session. She showed some improvements in balance as she was able to walk short distance without assistance in pool or with less assistance. She will continue to benefit from skilled PT  OBJECTIVE IMPAIRMENTS: Abnormal gait, decreased activity tolerance, decreased balance, decreased coordination, decreased endurance, decreased mobility, difficulty walking, decreased ROM, decreased strength, increased fascial restrictions, impaired perceived functional ability, impaired flexibility, improper body mechanics, and pain.   ACTIVITY LIMITATIONS: carrying, lifting, bending, sitting, standing, squatting, sleeping, stairs,  transfers, and locomotion level  PARTICIPATION LIMITATIONS: meal prep, cleaning, laundry, interpersonal relationship, driving, shopping, and community activity  PERSONAL FACTORS:  PMH includes Multiple sclerosis, cancer, DM, HTN.  are also affecting patient's functional outcome.   REHAB POTENTIAL: Good  CLINICAL DECISION MAKING: Stable/uncomplicated  EVALUATION COMPLEXITY: Low   GOALS: Goals reviewed with patient? Yes  SHORT TERM GOALS: (target date for Short term goals are 3 weeks 03/15/2023)   1.  Patient will demonstrate independent use of home exercise program to maintain progress from in clinic treatments.  Goal status: MET 03/09/23  LONG TERM GOALS: (target dates for all long term goals are 10 weeks  05/03/2023 )   1. Patient will demonstrate/report pain at worst less than or equal to 2/10 to facilitate minimal limitation in daily activity secondary to pain symptoms.  Goal status: New   2. Patient will demonstrate independent use of home exercise program to facilitate ability to maintain/progress functional gains from skilled physical therapy services.  Goal status: New   3. Patient will demonstrate FOTO outcome > or = 53 % to indicate reduced disability due to condition.  Goal status: New   4.  Patient will demonstrate Rt LE MMT 5/5 throughout to faciltiate usual transfers, stairs, squatting at [facility] for daily life.   Goal status: New   5.  Patient will demonstrate ability to ascend and descend stairs reciprocally without UE assist for community /house integration.  Goal status: New   6.  Patient will demonstrate TUG < 14 to indicate reduce fall risk.  Goal status: New   7.  Patient will demonstrate independent ambulation community distances > 300 ft Goal Status: New   PLAN:  PT FREQUENCY: 1-2x/week  PT DURATION: 10 weeks  PLANNED INTERVENTIONS: Therapeutic exercises, Therapeutic activity, Neuro Muscular re-education, Balance training, Gait training,  Patient/Family education, Joint mobilization, Stair training, DME instructions, Dry Needling, Electrical stimulation, Traction, Cryotherapy, vasopneumatic deviceMoist heat, Taping, Ultrasound, Ionotophoresis /ml Dexamethasone, and Manual therapy.  All included unless contraindicated  PLAN FOR NEXT SESSION:   Gross movement improvements for strength, ROM, gait; if she has cane work on Investment banker, operational. Aquatic PT if desired.   Ivery Quale, PT, DPT 04/01/23 10:33 AM

## 2023-04-05 ENCOUNTER — Ambulatory Visit (INDEPENDENT_AMBULATORY_CARE_PROVIDER_SITE_OTHER): Payer: Medicare PPO | Admitting: Physical Therapy

## 2023-04-05 ENCOUNTER — Encounter: Payer: Self-pay | Admitting: Physical Therapy

## 2023-04-05 DIAGNOSIS — M6281 Muscle weakness (generalized): Secondary | ICD-10-CM | POA: Diagnosis not present

## 2023-04-05 DIAGNOSIS — R262 Difficulty in walking, not elsewhere classified: Secondary | ICD-10-CM

## 2023-04-05 DIAGNOSIS — M25551 Pain in right hip: Secondary | ICD-10-CM

## 2023-04-05 NOTE — Therapy (Signed)
OUTPATIENT PHYSICAL THERAPY TREATMENT Progress Note reporting period 02/22/23 to 04/05/23  See below for objective and subjective measurements relating to patients progress with PT.    Patient Name: Alisha Martinez MRN: [id number] DOB:16-Jan-1956, 67 y.o., female Today's Date: 04/05/2023  END OF SESSION:  PT End of Session - 04/05/23 1614     Visit Number 10    Number of Visits 20    Date for PT Re-Evaluation 05/03/23    Authorization Type [employer] $25 copay    Progress Note Due on Visit 20    PT Start Time 1603    PT Stop Time 1643    PT Time Calculation (min) 40 min    Activity Tolerance Patient tolerated treatment well    Behavior During Therapy WFL for tasks assessed/performed                 Past Medical History:  Diagnosis Date   Arthritis    Asthma    Cancer    "cancerous cells found on pap smear- removed surgically"   Diabetes mellitus without complication    Fibromyalgia    Hypertension    MS (multiple sclerosis)    Sleep apnea    Past Surgical History:  Procedure Laterality Date   APPENDECTOMY     EYE SURGERY Bilateral 2023   "repaired torn retina"   KNEE SURGERY     TOTAL HIP ARTHROPLASTY Right 12/28/2022   Procedure: RIGHT TOTAL HIP ARTHROPLASTY ANTERIOR APPROACH;  Surgeon: Kathryne Hitch, MD;  Location: [facility] OR;  Service: Orthopedics;  Laterality: Right;   VAGINA SURGERY     "pt states the doctor surgically removed cancerous cells from vaginal area"   Patient Active Problem List   Diagnosis Date Noted   Diabetes mellitus without complication 12/31/2022   Hypertension 12/31/2022   Asthma 12/31/2022   Sinus tachycardia 12/31/2022   Dyslipidemia 12/31/2022   OSA (obstructive sleep apnea) 12/31/2022   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   Other fatigue 11/22/2022   Multiple sclerosis 10/26/2021   Pain in both lower extremities 10/26/2021   Urinary dysfunction 10/26/2021   Numbness 10/26/2021    PCP: None listed in  epic  REFERRING PROVIDER: Kathryne Hitch, MD  REFERRING DIAG: [phone] (ICD-10-CM) - Status post total replacement of right hip  THERAPY DIAG:  Pain in right hip  Difficulty in walking, not elsewhere classified  Muscle weakness (generalized)  Rationale for Evaluation and Treatment: Rehabilitation  ONSET DATE: 12/28/2022 surgery  SUBJECTIVE:   SUBJECTIVE STATEMENT: She relays not having too much pain overall today, she feels PT is helping  PERTINENT HISTORY: PMH includes Multiple sclerosis, cancer, DM, HTN.  PAIN:  NPRS scale: see above Pain location: Rt hip laterally/thigh Pain description: constant, burning  Aggravating factors: prolonged standing, walking, bending, squatting.  Relieving factors: rest  PRECAUTIONS: Anterior hip  WEIGHT BEARING RESTRICTIONS: No  FALLS:  Has patient fallen in last 6 months? No  LIVING ENVIRONMENT: Lives with: daughter, friend Lives in: House/apartment Stairs: 17 stairs with rail on Lt going up Has following equipment at home: Rollator and quad cane  OCCUPATION: Disability to retired  PLOF: Independent, line dancing (was limited by pain in hip prior to surgery), shopping, usually drives.   PATIENT GOALS: Reduce pain, get back to dancing, driving, walk independent.   OBJECTIVE:    PATIENT SURVEYS:  02/22/2023 FOTO intake:  43  predicted:  53  MUSCLE LENGTH: 02/22/2023 No specific measurements  PALPATION: 02/22/2023 Tenderness in lateral thigh musculature and  greater trochanter, down lateral thigh  LOWER EXTREMITY ROM:   ROM Right 02/22/2023 Held for future assessment due to time RIght 02/24/2023 Right 04/05/23  Hip flexion  100 100  Hip extension     Hip abduction  30 35  Hip adduction  10   Hip internal rotation   25  Hip external rotation   20  Knee flexion     Knee extension     Ankle dorsiflexion     Ankle plantarflexion     Ankle inversion     Ankle eversion      (Blank rows = not tested)  LOWER  EXTREMITY MMT:  MMT in sitting Right 02/24/2023 Right 03/23/23 Right 04/05/23  Hip flexion 3+ 4- 4  Hip extension     Hip abduction 3+ 4- 4  Hip adduction     Hip internal rotation     Hip external rotation     Knee flexion 4 4+ 5  Knee extension 4 4+ 4+  Ankle dorsiflexion     Ankle plantarflexion     Ankle inversion     Ankle eversion      (Blank rows = not tested)   FUNCTIONAL TESTS:  02/22/2023  TUG c rollator:  16.75 18 inch chair transfer: s UE assist with increased difficulty.  Lt SLS: unable  Rt SLS: unable   04/05/23: TUG with rollator 8.9 seconds   GAIT: 02/22/2023 Rollator use in clinic c forwared trunk lean, decreased gait speed, decreased step length with Rt LE, decrease stance.    TODAY'S TREATMENT  DATE: 04/05/23 TherEx: Nu-step L6 X 8 min UE/LE Sidestepping at counter top with UE support 3 round trips Standing hip abd 2# X 15 bilat Standing hip extension 2# X 15 bilat Standing marches 2# X 15 bilat Sidestepping with red band around ankles 3 round trips in bars Step ups using bilat UE support 4 inch step leading Rt leg X 10 fwd and X 10 latreal Leg press DL 21#62# [id number], then SL 57#25# [id number] on Rt Gait with SPC 20 feet X 2, cues and demo for technique, CGA with this Updated measurements, goals, and TUG for progress note   04/01/23 Pt seen for aquatic therapy today.  Treatment took place in water 3.5-4.75 ft in depth at the [facility] pool. Temp of water was 91.  Pt entered/exited the pool via steps with both rails.  Pt requires the buoyancy and hydrostatic pressure of water for support, and to offload joints by unweighting joint load by at least 50 % in navel deep water and by at least 75-80% in chest to neck deep water.  Viscosity of the water is needed for resistance of strengthening. Water current perturbations provides challenge to standing balance requiring increased core activation.  Lateral walking 8 feet X 6 with bilat UE support (waist to  shoulder dept) Fwd walking 8 feet X 6 with bilat UE support (waist to shoulder dept) Retro walking 8 feet X 6 with one UE support (waist to shoulder dept) Standing hip abductions/adduction swings with bilat UE support X 20 reps Standing hip flexion/extensions X 20 bilat with UE support Standing marches X 20 with bilat UE suppprt Standing hamstring curls X 20 bilat with UE support Forward walking horizontal width of pool 30 feet X 3 holding noodle at isometric 90 deg shoulder flexion Push and pull kickboard with UE X 20 reps holding mini squat Shoulder extensions with pool dumbells X 15 bilat Step ups onto first step of pool  using one hand rails X 10 bilat Walking with pool noodle across 3.6 feet 3 round trips Standing leg swings X 15 bilat add/abd Standing marches  X 15 bilat   02/2723 TherEx: Nu-step L6 X 8 min UE/LE Sidestepping at counter top with UE support 3 round trips Standing hip abd red X 15 bilat Standing hip extension red X 15 bilat Seated LAQ 3# X 15 bilat Standing marches 3# X 15 bilat Seated hip adduction ball squeeze 5 sec X 15 Seated clams green  X15 Step ups using bilat UE support 4 inch step leading Rt leg X 10 fwd and X 10 latreal Leg press DL 40# [id number], then SL 91# [id number] on Rt  Ice x 8 min to Rt hip in sitting     Attempted spinal decompression with arms over pool noodle and float but she was apprehensive about this     PATIENT EDUCATION:  02/22/2023 Education details: HEP, POC Person educated: Patient Education method: Programmer, multimedia, Demonstration, Verbal cues, and Handouts Education comprehension: verbalized understanding, returned demonstration, and verbal cues required  HOME EXERCISE PROGRAM: Access Code: [email or web address] URL: [email or web address] Date: 02/22/2023 Prepared by: Chyrel Masson  Exercises - Seated Quad Set  - 2-3 x daily - 7 x weekly - 1 sets - 10 reps - 5 hold - Sit to Stand  - 3 x daily - 7 x weekly - 1 sets - 10 reps -  Seated March  - 1-2 x daily - 7 x weekly - 1-2 sets - 10 reps  ASSESSMENT:  CLINICAL IMPRESSION: Progress note today shows she has made functional progress in Rt hip ROM, strength, gait, and timed up and go test. She has not yet met all of her PT goals so PT would recommend to continue with current PT plan of care.  OBJECTIVE IMPAIRMENTS: Abnormal gait, decreased activity tolerance, decreased balance, decreased coordination, decreased endurance, decreased mobility, difficulty walking, decreased ROM, decreased strength, increased fascial restrictions, impaired perceived functional ability, impaired flexibility, improper body mechanics, and pain.   ACTIVITY LIMITATIONS: carrying, lifting, bending, sitting, standing, squatting, sleeping, stairs, transfers, and locomotion level  PARTICIPATION LIMITATIONS: meal prep, cleaning, laundry, interpersonal relationship, driving, shopping, and community activity  PERSONAL FACTORS:  PMH includes Multiple sclerosis, cancer, DM, HTN.  are also affecting patient's functional outcome.   REHAB POTENTIAL: Good  CLINICAL DECISION MAKING: Stable/uncomplicated  EVALUATION COMPLEXITY: Low   GOALS: Goals reviewed with patient? Yes  SHORT TERM GOALS: (target date for Short term goals are 3 weeks 03/15/2023)   1.  Patient will demonstrate independent use of home exercise program to maintain progress from in clinic treatments.  Goal status: MET 03/09/23  LONG TERM GOALS: (target dates for all long term goals are 10 weeks  05/03/2023 )   1. Patient will demonstrate/report pain at worst less than or equal to 2/10 to facilitate minimal limitation in daily activity secondary to pain symptoms.  Goal status: ongoing 04/05/23   2. Patient will demonstrate independent use of home exercise program to facilitate ability to maintain/progress functional gains from skilled physical therapy services.  Goal status: ongoing 04/05/23   3. Patient will demonstrate FOTO outcome >  or = 53 % to indicate reduced disability due to condition.  Goal status: ongoing 04/05/23   4.  Patient will demonstrate Rt LE MMT 5/5 throughout to faciltiate usual transfers, stairs, squatting at [facility] for daily life.   Goal status: ongoing 04/05/23   5.  Patient will demonstrate ability to ascend and descend stairs reciprocally without  UE assist for community /house integration.  Goal status: ongoing 04/05/23   6.  Patient will demonstrate TUG < 14 to indicate reduce fall risk.  Goal status: MET 04/05/23   7.  Patient will demonstrate independent ambulation community distances > 300 ft Goal Status: MET 04/05/23   PLAN:  PT FREQUENCY: 1-2x/week  PT DURATION: 10 weeks  PLANNED INTERVENTIONS: Therapeutic exercises, Therapeutic activity, Neuro Muscular re-education, Balance training, Gait training, Patient/Family education, Joint mobilization, Stair training, DME instructions, Dry Needling, Electrical stimulation, Traction, Cryotherapy, vasopneumatic deviceMoist heat, Taping, Ultrasound, Ionotophoresis 4mg /ml Dexamethasone, and Manual therapy.  All included unless contraindicated  PLAN FOR NEXT SESSION:   Gross movement improvements for strength, ROM, gait; if she has cane work on Investment banker, operational. Aquatic PT if desired.   Ivery Quale, PT, DPT 04/05/23 9:52 PM

## 2023-04-08 ENCOUNTER — Encounter: Payer: Self-pay | Admitting: Physical Therapy

## 2023-04-08 ENCOUNTER — Ambulatory Visit (INDEPENDENT_AMBULATORY_CARE_PROVIDER_SITE_OTHER): Payer: Medicare PPO | Admitting: Physical Therapy

## 2023-04-08 DIAGNOSIS — R262 Difficulty in walking, not elsewhere classified: Secondary | ICD-10-CM

## 2023-04-08 DIAGNOSIS — M25551 Pain in right hip: Secondary | ICD-10-CM

## 2023-04-08 DIAGNOSIS — M6281 Muscle weakness (generalized): Secondary | ICD-10-CM | POA: Diagnosis not present

## 2023-04-08 NOTE — Therapy (Signed)
OUTPATIENT PHYSICAL THERAPY TREATMENT  Patient Name: Alisha Martinez MRN: [id number] DOB:07-13-1956, 67 y.o., female Today's Date: 04/08/2023  END OF SESSION:  PT End of Session - 04/08/23 0954     Visit Number 11    Number of Visits 20    Date for PT Re-Evaluation 05/03/23    Authorization Type [employer] $25 copay    Progress Note Due on Visit 20    PT Start Time 0930    PT Stop Time 1010    PT Time Calculation (min) 40 min    Activity Tolerance Patient tolerated treatment well    Behavior During Therapy WFL for tasks assessed/performed                 Past Medical History:  Diagnosis Date   Arthritis    Asthma    Cancer    "cancerous cells found on pap smear- removed surgically"   Diabetes mellitus without complication    Fibromyalgia    Hypertension    MS (multiple sclerosis)    Sleep apnea    Past Surgical History:  Procedure Laterality Date   APPENDECTOMY     EYE SURGERY Bilateral 2023   "repaired torn retina"   KNEE SURGERY     TOTAL HIP ARTHROPLASTY Right 12/28/2022   Procedure: RIGHT TOTAL HIP ARTHROPLASTY ANTERIOR APPROACH;  Surgeon: Kathryne Hitch, MD;  Location: [facility] OR;  Service: Orthopedics;  Laterality: Right;   VAGINA SURGERY     "pt states the doctor surgically removed cancerous cells from vaginal area"   Patient Active Problem List   Diagnosis Date Noted   Diabetes mellitus without complication 12/31/2022   Hypertension 12/31/2022   Asthma 12/31/2022   Sinus tachycardia 12/31/2022   Dyslipidemia 12/31/2022   OSA (obstructive sleep apnea) 12/31/2022   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   Other fatigue 11/22/2022   Multiple sclerosis 10/26/2021   Pain in both lower extremities 10/26/2021   Urinary dysfunction 10/26/2021   Numbness 10/26/2021    PCP: None listed in epic  REFERRING PROVIDER: Kathryne Hitch, MD  REFERRING DIAG: [phone] (ICD-10-CM) - Status post total replacement of right hip  THERAPY  DIAG:  Pain in right hip  Difficulty in walking, not elsewhere classified  Muscle weakness (generalized)  Rationale for Evaluation and Treatment: Rehabilitation  ONSET DATE: 12/28/2022 surgery  SUBJECTIVE:   SUBJECTIVE STATEMENT: She relays not in pain upon arriva  PERTINENT HISTORY: PMH includes Multiple sclerosis, cancer, DM, HTN.  PAIN:  NPRS scale: 0/10 Pain location: Rt hip laterally/thigh Pain description: constant, burning  Aggravating factors: prolonged standing, walking, bending, squatting.  Relieving factors: rest  PRECAUTIONS: Anterior hip  WEIGHT BEARING RESTRICTIONS: No  FALLS:  Has patient fallen in last 6 months? No  LIVING ENVIRONMENT: Lives with: daughter, friend Lives in: House/apartment Stairs: 17 stairs with rail on Lt going up Has following equipment at home: Rollator and quad cane  OCCUPATION: Disability to retired  PLOF: Independent, line dancing (was limited by pain in hip prior to surgery), shopping, usually drives.   PATIENT GOALS: Reduce pain, get back to dancing, driving, walk independent.   OBJECTIVE:    PATIENT SURVEYS:  02/22/2023 FOTO intake:  43  predicted:  53  MUSCLE LENGTH: 02/22/2023 No specific measurements  PALPATION: 02/22/2023 Tenderness in lateral thigh musculature and greater trochanter, down lateral thigh  LOWER EXTREMITY ROM:   ROM Right 02/22/2023 Held for future assessment due to time RIght 02/24/2023 Right 04/05/23  Hip flexion  100 100  Hip extension     Hip abduction  30 35  Hip adduction  10   Hip internal rotation   25  Hip external rotation   20  Knee flexion     Knee extension     Ankle dorsiflexion     Ankle plantarflexion     Ankle inversion     Ankle eversion      (Blank rows = not tested)  LOWER EXTREMITY MMT:  MMT in sitting Right 02/24/2023 Right 03/23/23 Right 04/05/23  Hip flexion 3+ 4- 4  Hip extension     Hip abduction 3+ 4- 4  Hip adduction     Hip internal rotation     Hip  external rotation     Knee flexion 4 4+ 5  Knee extension 4 4+ 4+  Ankle dorsiflexion     Ankle plantarflexion     Ankle inversion     Ankle eversion      (Blank rows = not tested)   FUNCTIONAL TESTS:  02/22/2023  TUG c rollator:  16.75 18 inch chair transfer: s UE assist with increased difficulty.  Lt SLS: unable  Rt SLS: unable   04/05/23: TUG with rollator 8.9 seconds   GAIT: 02/22/2023 Rollator use in clinic c forwared trunk lean, decreased gait speed, decreased step length with Rt LE, decrease stance.    TODAY'S TREATMENT  DATE: 04/08/23 Pt seen for aquatic therapy today.  Treatment took place in water 3.5-4.75 ft in depth at the [facility] pool. Temp of water was 91.  Pt entered/exited the pool via steps with both rails.  Pt requires the buoyancy and hydrostatic pressure of water for support, and to offload joints by unweighting joint load by at least 50 % in navel deep water and by at least 75-80% in chest to neck deep water.  Viscosity of the water is needed for resistance of strengthening. Water current perturbations provides challenge to standing balance requiring increased core activation.  Lateral walking 8 feet X 6 with bilat UE support (waist to shoulder dept) Fwd walking 8 feet X 6 with bilat UE support (waist to shoulder dept) Retro walking 8 feet X 6 with one UE support (waist to shoulder dept) Standing hip abductions/adduction swings with bilat UE support X 20 reps Standing hip flexion/extensions X 20 bilat with UE support Standing marches X 20 with bilat UE suppprt Standing hamstring curls X 20 bilat with UE support Forward walking horizontal width of pool 30 feet X 3 holding floating barbell at isometric 90 deg shoulder flexion Push and pull kickboard with UE X 20 reps holding mini squat Shoulder extensions with pool dumbells X 15 bilat Step ups onto first step of pool using one hand rails X 17 bilat Standing squats using bilat UE support   X15 Standing with back against the wall shoulder extension with yellow DB X 20 Standing with back on wall  3 way steps fwd, lateral, back with DB support X 5 bilat  04/05/23 TherEx: Nu-step L6 X 8 min UE/LE Sidestepping at counter top with UE support 3 round trips Standing hip abd 2# X 15 bilat Standing hip extension 2# X 15 bilat Standing marches 2# X 15 bilat Sidestepping with red band around ankles 3 round trips in bars Step ups using bilat UE support 4 inch step leading Rt leg X 10 fwd and X 10 latreal Leg press DL 16# [id number], then SL 38# [id number] on Rt Gait with SPC 20 feet X 2, cues and  demo for technique, CGA with this Updated measurements, goals, and TUG for progress note      02/2723 TherEx: Nu-step L6 X 8 min UE/LE Sidestepping at counter top with UE support 3 round trips Standing hip abd red X 15 bilat Standing hip extension red X 15 bilat Seated LAQ 3# X 15 bilat Standing marches 3# X 15 bilat Seated hip adduction ball squeeze 5 sec X 15 Seated clams green  X15 Step ups using bilat UE support 4 inch step leading Rt leg X 10 fwd and X 10 latreal Leg press DL 82# 9F62, then SL 13# 0Q65 on Rt  Ice x 8 min to Rt hip in sitting     Attempted spinal decompression with arms over pool noodle and float but she was apprehensive about this     PATIENT EDUCATION:  02/22/2023 Education details: HEP, POC Person educated: Patient Education method: Programmer, multimedia, Demonstration, Verbal cues, and Handouts Education comprehension: verbalized understanding, returned demonstration, and verbal cues required  HOME EXERCISE PROGRAM: Access Code: [email or web address] URL: [email or web address] Date: 02/22/2023 Prepared by: Chyrel Masson  Exercises - Seated Quad Set  - 2-3 x daily - 7 x weekly - 1 sets - 10 reps - 5 hold - Sit to Stand  - 3 x daily - 7 x weekly - 1 sets - 10 reps - Seated March  - 1-2 x daily - 7 x weekly - 1-2 sets - 10 reps  ASSESSMENT:  CLINICAL  IMPRESSION: She had good overall tolerance to aquatic progression today. PT recommending to continue current plan as she is making progress  OBJECTIVE IMPAIRMENTS: Abnormal gait, decreased activity tolerance, decreased balance, decreased coordination, decreased endurance, decreased mobility, difficulty walking, decreased ROM, decreased strength, increased fascial restrictions, impaired perceived functional ability, impaired flexibility, improper body mechanics, and pain.   ACTIVITY LIMITATIONS: carrying, lifting, bending, sitting, standing, squatting, sleeping, stairs, transfers, and locomotion level  PARTICIPATION LIMITATIONS: meal prep, cleaning, laundry, interpersonal relationship, driving, shopping, and community activity  PERSONAL FACTORS:  PMH includes Multiple sclerosis, cancer, DM, HTN.  are also affecting patient's functional outcome.   REHAB POTENTIAL: Good  CLINICAL DECISION MAKING: Stable/uncomplicated  EVALUATION COMPLEXITY: Low   GOALS: Goals reviewed with patient? Yes  SHORT TERM GOALS: (target date for Short term goals are 3 weeks 03/15/2023)   1.  Patient will demonstrate independent use of home exercise program to maintain progress from in clinic treatments.  Goal status: MET 03/09/23  LONG TERM GOALS: (target dates for all long term goals are 10 weeks  05/03/2023 )   1. Patient will demonstrate/report pain at worst less than or equal to 2/10 to facilitate minimal limitation in daily activity secondary to pain symptoms.  Goal status: ongoing 04/05/23   2. Patient will demonstrate independent use of home exercise program to facilitate ability to maintain/progress functional gains from skilled physical therapy services.  Goal status: ongoing 04/05/23   3. Patient will demonstrate FOTO outcome > or = 53 % to indicate reduced disability due to condition.  Goal status: ongoing 04/05/23   4.  Patient will demonstrate Rt LE MMT 5/5 throughout to faciltiate usual transfers,  stairs, squatting at [facility] for daily life.   Goal status: ongoing 04/05/23   5.  Patient will demonstrate ability to ascend and descend stairs reciprocally without UE assist for community /house integration.  Goal status: ongoing 04/05/23   6.  Patient will demonstrate TUG < 14 to indicate reduce fall risk.  Goal status: MET 04/05/23   7.  Patient  will demonstrate independent ambulation community distances > 300 ft Goal Status: MET 04/05/23   PLAN:  PT FREQUENCY: 1-2x/week  PT DURATION: 10 weeks  PLANNED INTERVENTIONS: Therapeutic exercises, Therapeutic activity, Neuro Muscular re-education, Balance training, Gait training, Patient/Family education, Joint mobilization, Stair training, DME instructions, Dry Needling, Electrical stimulation, Traction, Cryotherapy, vasopneumatic deviceMoist heat, Taping, Ultrasound, Ionotophoresis /ml Dexamethasone, and Manual therapy.  All included unless contraindicated  PLAN FOR NEXT SESSION:   Gross movement improvements for strength, ROM, gait; if she has cane work on Investment banker, operational. Aquatic PT if desired.   Ivery Quale, PT, DPT 04/08/23 9:54 AM

## 2023-04-11 ENCOUNTER — Ambulatory Visit (INDEPENDENT_AMBULATORY_CARE_PROVIDER_SITE_OTHER): Payer: Medicare PPO | Admitting: Physical Therapy

## 2023-04-11 ENCOUNTER — Encounter: Payer: Self-pay | Admitting: Physical Therapy

## 2023-04-11 DIAGNOSIS — M25551 Pain in right hip: Secondary | ICD-10-CM

## 2023-04-11 DIAGNOSIS — M6281 Muscle weakness (generalized): Secondary | ICD-10-CM | POA: Diagnosis not present

## 2023-04-11 DIAGNOSIS — R262 Difficulty in walking, not elsewhere classified: Secondary | ICD-10-CM

## 2023-04-11 NOTE — Therapy (Signed)
OUTPATIENT PHYSICAL THERAPY TREATMENT  Patient Name: Alisha Martinez MRN: [id number] DOB:Aug 28, 1956, 67 y.o., female Today's Date: 04/11/2023  END OF SESSION:  PT End of Session - 04/11/23 1226     Visit Number 12    Number of Visits 20    Date for PT Re-Evaluation 05/03/23    Authorization Type [employer] $25 copay    Progress Note Due on Visit 20    PT Start Time 1142    PT Stop Time 1225    PT Time Calculation (min) 43 min    Activity Tolerance Patient tolerated treatment well    Behavior During Therapy WFL for tasks assessed/performed                  Past Medical History:  Diagnosis Date   Arthritis    Asthma    Cancer    "cancerous cells found on pap smear- removed surgically"   Diabetes mellitus without complication    Fibromyalgia    Hypertension    MS (multiple sclerosis)    Sleep apnea    Past Surgical History:  Procedure Laterality Date   APPENDECTOMY     EYE SURGERY Bilateral 2023   "repaired torn retina"   KNEE SURGERY     TOTAL HIP ARTHROPLASTY Right 12/28/2022   Procedure: RIGHT TOTAL HIP ARTHROPLASTY ANTERIOR APPROACH;  Surgeon: Kathryne Hitch, MD;  Location: [facility] OR;  Service: Orthopedics;  Laterality: Right;   VAGINA SURGERY     "pt states the doctor surgically removed cancerous cells from vaginal area"   Patient Active Problem List   Diagnosis Date Noted   Diabetes mellitus without complication 12/31/2022   Hypertension 12/31/2022   Asthma 12/31/2022   Sinus tachycardia 12/31/2022   Dyslipidemia 12/31/2022   OSA (obstructive sleep apnea) 12/31/2022   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   Other fatigue 11/22/2022   Multiple sclerosis 10/26/2021   Pain in both lower extremities 10/26/2021   Urinary dysfunction 10/26/2021   Numbness 10/26/2021    PCP: None listed in epic  REFERRING PROVIDER: Kathryne Hitch, MD  REFERRING DIAG: [phone] (ICD-10-CM) - Status post total replacement of right hip  THERAPY  DIAG:  Pain in right hip  Difficulty in walking, not elsewhere classified  Muscle weakness (generalized)  Rationale for Evaluation and Treatment: Rehabilitation  ONSET DATE: 12/28/2022 surgery  SUBJECTIVE:   SUBJECTIVE STATEMENT: She was tired after her water session. She was able to cook a big meal this weekend.   PERTINENT HISTORY: PMH includes Multiple sclerosis, cancer, DM, HTN.  PAIN:  NPRS scale: 0/10 upon arrival but some pain if her ITB is stretched Pain location: Rt hip laterally/thigh Pain description: constant, burning  Aggravating factors: prolonged standing, walking, bending, squatting.  Relieving factors: rest  PRECAUTIONS: Anterior hip  WEIGHT BEARING RESTRICTIONS: No  FALLS:  Has patient fallen in last 6 months? No  LIVING ENVIRONMENT: Lives with: daughter, friend Lives in: House/apartment Stairs: 17 stairs with rail on Lt going up Has following equipment at home: Rollator and quad cane  OCCUPATION: Disability to retired  PLOF: Independent, line dancing (was limited by pain in hip prior to surgery), shopping, usually drives.   PATIENT GOALS: Reduce pain, get back to dancing, driving, walk independent.   OBJECTIVE:    PATIENT SURVEYS:  02/22/2023 FOTO intake:  43  predicted:  53  MUSCLE LENGTH: 02/22/2023 No specific measurements  PALPATION: 02/22/2023 Tenderness in lateral thigh musculature and greater trochanter, down lateral thigh  LOWER EXTREMITY ROM:  ROM Right 02/22/2023 Held for future assessment due to time RIght 02/24/2023 Right 04/05/23  Hip flexion  100 100  Hip extension     Hip abduction  30 35  Hip adduction  10   Hip internal rotation   25  Hip external rotation   20  Knee flexion     Knee extension     Ankle dorsiflexion     Ankle plantarflexion     Ankle inversion     Ankle eversion      (Blank rows = not tested)  LOWER EXTREMITY MMT:  MMT in sitting Right 02/24/2023 Right 03/23/23 Right 04/05/23  Hip flexion  3+ 4- 4  Hip extension     Hip abduction 3+ 4- 4  Hip adduction     Hip internal rotation     Hip external rotation     Knee flexion 4 4+ 5  Knee extension 4 4+ 4+  Ankle dorsiflexion     Ankle plantarflexion     Ankle inversion     Ankle eversion      (Blank rows = not tested)   FUNCTIONAL TESTS:  02/22/2023  TUG c rollator:  16.75 18 inch chair transfer: s UE assist with increased difficulty.  Lt SLS: unable  Rt SLS: unable   04/05/23: TUG with rollator 8.9 seconds   GAIT: 02/22/2023 Rollator use in clinic c forwared trunk lean, decreased gait speed, decreased step length with Rt LE, decrease stance.    TODAY'S TREATMENT  DATE: 04/11/23 TherEx: Nu-step L6 X 8 min UE/LE Sidestepping at counter top with UE support 3 round trips with red band Step ups 4 inch step X 10 fwd and X 10 lateral leading with Rt with bilat UE support Seated SLR 2 X 10 bilat Standing hip extension X 15 bilat with red Standing marches X 15 bilat with red Sidestepping with red band around ankles 3 round trips in bars Step ups using bilat UE support 4 inch step leading Rt leg X 10 fwd and X 10 latreal Leg press DL 69# [id number], then SL 28# [id number] on Rt Gait with SPC 50 feet X 2 with supervision, more lateral trunk lean observed    04/08/23 Pt seen for aquatic therapy today.  Treatment took place in water 3.5-4.75 ft in depth at the [facility] pool. Temp of water was 91.  Pt entered/exited the pool via steps with both rails.  Pt requires the buoyancy and hydrostatic pressure of water for support, and to offload joints by unweighting joint load by at least 50 % in navel deep water and by at least 75-80% in chest to neck deep water.  Viscosity of the water is needed for resistance of strengthening. Water current perturbations provides challenge to standing balance requiring increased core activation.  Lateral walking 8 feet X 6 with bilat UE support (waist to shoulder dept) Fwd walking 8 feet X 6  with bilat UE support (waist to shoulder dept) Retro walking 8 feet X 6 with one UE support (waist to shoulder dept) Standing hip abductions/adduction swings with bilat UE support X 20 reps Standing hip flexion/extensions X 20 bilat with UE support Standing marches X 20 with bilat UE suppprt Standing hamstring curls X 20 bilat with UE support Forward walking horizontal width of pool 30 feet X 3 holding floating barbell at isometric 90 deg shoulder flexion Push and pull kickboard with UE X 20 reps holding mini squat Shoulder extensions with pool dumbells X 15 bilat Step ups  onto first step of pool using one hand rails X 17 bilat Standing squats using bilat UE support  X15 Standing with back against the wall shoulder extension with yellow DB X 20 Standing with back on wall  3 way steps fwd, lateral, back with DB support X 5 bilat      PATIENT EDUCATION:  02/22/2023 Education details: HEP, POC Person educated: Patient Education method: Programmer, multimedia, Demonstration, Verbal cues, and Handouts Education comprehension: verbalized understanding, returned demonstration, and verbal cues required  HOME EXERCISE PROGRAM: Access Code: [email or web address] URL: [email or web address] Date: 02/22/2023 Prepared by: Chyrel Masson  Exercises - Seated Quad Set  - 2-3 x daily - 7 x weekly - 1 sets - 10 reps - 5 hold - Sit to Stand  - 3 x daily - 7 x weekly - 1 sets - 10 reps - Seated March  - 1-2 x daily - 7 x weekly - 1-2 sets - 10 reps  ASSESSMENT:  CLINICAL IMPRESSION: We continued to work to progress her hip strength as tolerated as she still has some weakness observed especially when using SPC vs Rollator. PT recommending to continue with current plan.   OBJECTIVE IMPAIRMENTS: Abnormal gait, decreased activity tolerance, decreased balance, decreased coordination, decreased endurance, decreased mobility, difficulty walking, decreased ROM, decreased strength, increased fascial restrictions,  impaired perceived functional ability, impaired flexibility, improper body mechanics, and pain.   ACTIVITY LIMITATIONS: carrying, lifting, bending, sitting, standing, squatting, sleeping, stairs, transfers, and locomotion level  PARTICIPATION LIMITATIONS: meal prep, cleaning, laundry, interpersonal relationship, driving, shopping, and community activity  PERSONAL FACTORS:  PMH includes Multiple sclerosis, cancer, DM, HTN.  are also affecting patient's functional outcome.   REHAB POTENTIAL: Good  CLINICAL DECISION MAKING: Stable/uncomplicated  EVALUATION COMPLEXITY: Low   GOALS: Goals reviewed with patient? Yes  SHORT TERM GOALS: (target date for Short term goals are 3 weeks 03/15/2023)   1.  Patient will demonstrate independent use of home exercise program to maintain progress from in clinic treatments.  Goal status: MET 03/09/23  LONG TERM GOALS: (target dates for all long term goals are 10 weeks  05/03/2023 )   1. Patient will demonstrate/report pain at worst less than or equal to 2/10 to facilitate minimal limitation in daily activity secondary to pain symptoms.  Goal status: ongoing 04/05/23   2. Patient will demonstrate independent use of home exercise program to facilitate ability to maintain/progress functional gains from skilled physical therapy services.  Goal status: ongoing 04/05/23   3. Patient will demonstrate FOTO outcome > or = 53 % to indicate reduced disability due to condition.  Goal status: ongoing 04/05/23   4.  Patient will demonstrate Rt LE MMT 5/5 throughout to faciltiate usual transfers, stairs, squatting at [facility] for daily life.   Goal status: ongoing 04/05/23   5.  Patient will demonstrate ability to ascend and descend stairs reciprocally without UE assist for community /house integration.  Goal status: ongoing 04/05/23   6.  Patient will demonstrate TUG < 14 to indicate reduce fall risk.  Goal status: MET 04/05/23   7.  Patient will demonstrate independent  ambulation community distances > 300 ft Goal Status: MET 04/05/23   PLAN:  PT FREQUENCY: 1-2x/week  PT DURATION: 10 weeks  PLANNED INTERVENTIONS: Therapeutic exercises, Therapeutic activity, Neuro Muscular re-education, Balance training, Gait training, Patient/Family education, Joint mobilization, Stair training, DME instructions, Dry Needling, Electrical stimulation, Traction, Cryotherapy, vasopneumatic deviceMoist heat, Taping, Ultrasound, Ionotophoresis 4mg /ml Dexamethasone, and Manual therapy.  All included unless contraindicated  PLAN FOR NEXT  SESSION:   Gross movement improvements for strength, ROM, gait; if she has cane work on [employer], operational. Aquatic PT if desired.   Ivery Quale, PT, DPT 04/11/23 12:27 PM

## 2023-04-15 ENCOUNTER — Ambulatory Visit: Payer: Medicare PPO | Admitting: Physical Therapy

## 2023-04-18 ENCOUNTER — Encounter: Payer: Medicare PPO | Admitting: Physical Therapy

## 2023-04-22 ENCOUNTER — Ambulatory Visit (INDEPENDENT_AMBULATORY_CARE_PROVIDER_SITE_OTHER): Payer: Medicare PPO | Admitting: Physical Therapy

## 2023-04-22 ENCOUNTER — Encounter: Payer: Self-pay | Admitting: Physical Therapy

## 2023-04-22 DIAGNOSIS — M6281 Muscle weakness (generalized): Secondary | ICD-10-CM

## 2023-04-22 DIAGNOSIS — M25551 Pain in right hip: Secondary | ICD-10-CM

## 2023-04-22 DIAGNOSIS — R262 Difficulty in walking, not elsewhere classified: Secondary | ICD-10-CM

## 2023-04-22 NOTE — Therapy (Signed)
OUTPATIENT PHYSICAL THERAPY TREATMENT/[employer] Re-authorization/RECERT Referring diagnosis? N62.952 (ICD-10-CM) - Status post total replacement of right hip Treatment diagnosis? (if different than referring diagnosis) M25.551 What was this (referring dx) caused by? [x]  Surgery []  Fall []  Ongoing issue []  Arthritis []  Other: ____________   Laterality: [x]  Rt []  Lt []  Both   Check all possible CPT codes:                   *CHOOSE 10 OR LESS*                                []  97110 (Therapeutic Exercise)                   []  92507 (SLP Treatment)      []  97112 (Neuro Re-ed)                                []  92526 (Swallowing Treatment)                  []  97116 (Gait Training)                                []  [id number] (Cognitive Training, 1st 15 minutes) []  97140 (Manual Therapy)                           []  84132 (Cognitive Training, each add'l 15 minutes)         []  97164 (Re-evaluation)                              []  Other, List CPT Code ____________  []  97530 (Therapeutic Activities)                                            []  97535 (Self Care)               [x]  All codes above (97110 - 97535)             []  97012 (Mechanical Traction)             [x]  97014 (E-stim Unattended)             []  97032 (E-stim manual)             []  97033 (Ionto)             []  97035 (Ultrasound) [x]  97750 (Physical Performance Training) []  [id number] (Aquatic Therapy) []  97016 (Vasopneumatic Device) []  [id number] (Paraffin) []  97034 (Contrast Bath) []  97597 (Wound Care 1st 20 sq cm) []  97598 (Wound Care each add'l 20 sq cm) []  97760 (Orthotic Fabrication, Fitting, Training Initial) []  [id number] (Prosthetic Management and Training Initial) []  [id number] (Orthotic or Prosthetic Training/ Modification Subsequent)   Patient Name: Alisha Martinez MRN: [id number] DOB:1956/06/25, 67 y.o., female Today's Date: 04/22/2023  END OF SESSION:  PT End of Session - 04/22/23 0940     Visit Number 13    Number of Visits 20     Date for PT Re-Evaluation 06/03/23    Authorization Type [employer] $25 copay    Progress Note Due  on Visit 20    PT Start Time 0930    PT Stop Time 1013    PT Time Calculation (min) 43 min    Activity Tolerance Patient tolerated treatment well    Behavior During Therapy WFL for tasks assessed/performed                  Past Medical History:  Diagnosis Date   Arthritis    Asthma    Cancer (HCC)    "cancerous cells found on pap smear- removed surgically"   Diabetes mellitus without complication (HCC)    Fibromyalgia    Hypertension    MS (multiple sclerosis) (HCC)    Sleep apnea    Past Surgical History:  Procedure Laterality Date   APPENDECTOMY     EYE SURGERY Bilateral 2023   "repaired torn retina"   KNEE SURGERY     TOTAL HIP ARTHROPLASTY Right 12/28/2022   Procedure: RIGHT TOTAL HIP ARTHROPLASTY ANTERIOR APPROACH;  Surgeon: Kathryne Hitch, MD;  Location: [facility] OR;  Service: Orthopedics;  Laterality: Right;   VAGINA SURGERY     "pt states the doctor surgically removed cancerous cells from vaginal area"   Patient Active Problem List   Diagnosis Date Noted   Diabetes mellitus without complication (HCC) 12/31/2022   Hypertension 12/31/2022   Asthma 12/31/2022   Sinus tachycardia 12/31/2022   Dyslipidemia 12/31/2022   OSA (obstructive sleep apnea) 12/31/2022   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   Other fatigue 11/22/2022   Multiple sclerosis (HCC) 10/26/2021   Pain in both lower extremities 10/26/2021   Urinary dysfunction 10/26/2021   Numbness 10/26/2021    PCP: None listed in epic  REFERRING PROVIDER: Kathryne Hitch, MD  REFERRING DIAG: [phone] (ICD-10-CM) - Status post total replacement of right hip  THERAPY DIAG:  Pain in right hip  Difficulty in walking, not elsewhere classified  Muscle weakness (generalized)  Rationale for Evaluation and Treatment: Rehabilitation  ONSET DATE: 12/28/2022 surgery  SUBJECTIVE:    SUBJECTIVE STATEMENT: She relays overall very tired today but not having much pain. She does feel she wants to continue with PT for more strength and endurance.  PERTINENT HISTORY: PMH includes Multiple sclerosis, cancer, DM, HTN.  PAIN:  NPRS scale: 0/10 upon arrival but some pain if her ITB is stretched Pain location: Rt hip laterally/thigh Pain description: constant, burning  Aggravating factors: prolonged standing, walking, bending, squatting.  Relieving factors: rest  PRECAUTIONS: Anterior hip  WEIGHT BEARING RESTRICTIONS: No  FALLS:  Has patient fallen in last 6 months? No  LIVING ENVIRONMENT: Lives with: daughter, friend Lives in: House/apartment Stairs: 17 stairs with rail on Lt going up Has following equipment at home: Rollator and quad cane  OCCUPATION: Disability to retired  PLOF: Independent, line dancing (was limited by pain in hip prior to surgery), shopping, usually drives.   PATIENT GOALS: Reduce pain, get back to dancing, driving, walk independent.   OBJECTIVE:    PATIENT SURVEYS:  02/22/2023 FOTO intake:  43  predicted:  53  MUSCLE LENGTH: 02/22/2023 No specific measurements  PALPATION: 02/22/2023 Tenderness in lateral thigh musculature and greater trochanter, down lateral thigh  LOWER EXTREMITY ROM:   ROM Right 02/22/2023 Held for future assessment due to time RIght 02/24/2023 Right 04/05/23  Hip flexion  100 100  Hip extension     Hip abduction  30 35  Hip adduction  10   Hip internal rotation   25  Hip external rotation   20  Knee flexion     Knee extension     Ankle dorsiflexion     Ankle plantarflexion     Ankle inversion     Ankle eversion      (Blank rows = not tested)  LOWER EXTREMITY MMT:  MMT in sitting Right 02/24/2023 Right 03/23/23 Right 04/05/23  Hip flexion 3+ 4- 4  Hip extension     Hip abduction 3+ 4- 4  Hip adduction     Hip internal rotation     Hip external rotation     Knee flexion 4 4+ 5  Knee extension  4 4+ 4+  Ankle dorsiflexion     Ankle plantarflexion     Ankle inversion     Ankle eversion      (Blank rows = not tested)   FUNCTIONAL TESTS:  02/22/2023  TUG c rollator:  16.75 18 inch chair transfer: s UE assist with increased difficulty.  Lt SLS: unable  Rt SLS: unable   04/05/23: TUG with rollator 8.9 seconds   GAIT: 02/22/2023 Rollator use in clinic c forwared trunk lean, decreased gait speed, decreased step length with Rt LE, decrease stance.    TODAY'S TREATMENT  DATE: 04/22/23 Pt seen for aquatic therapy today.  Treatment took place in water 3.5-4.75 ft in depth at the [facility] pool. Temp of water was 91.  Pt entered/exited the pool via steps with both rails.  Pt requires the buoyancy and hydrostatic pressure of water for support, and to offload joints by unweighting joint load by at least 50 % in navel deep water and by at least 75-80% in chest to neck deep water.  Viscosity of the water is needed for resistance of strengthening. Water current perturbations provides challenge to standing balance requiring increased core activation.  Lateral walking (holding dumbells), forward walking (holding noodle), retro walking (holding noodle) 3 round trips in pool horizontal (30 feet) Leg swings add/abd X 20 bilat with UE support cues for slow and controlled movement Leg swings flexion/extension X 20 bilat with UE support cues for slow and controlled movements Standing hamstring curls X 20 bilat with UE support Standing marches with UE support X 20 bilat Standing hamstring curls with UE support X 15 bilat 1/4 squat isometric hold with row/chest press holding kickboard X 15 reps 1/4 squat with shoulder extension/flexion holding kickboard  X20 reps Marching X 20 bilat Seated on bench performing bicycle kicks X 20, hip abd/add X 20, bilat hip/knee flexion/ext X 20 Step ups onto first step in pool X 15 bilat Squats on first step of pool X 15  reps  04/11/23 TherEx: Nu-step L6 X 8 min UE/LE Sidestepping at counter top with UE support 3 round trips with red band Step ups 4 inch step X 10 fwd and X 10 lateral leading with Rt with bilat UE support Seated SLR 2 X 10 bilat Standing hip extension X 15 bilat with red Standing marches X 15 bilat with red Sidestepping with red band around ankles 3 round trips in bars Step ups using bilat UE support 4 inch step leading Rt leg X 10 fwd and X 10 latreal Leg press DL 53# [id number], then SL 03# [id number] on Rt Gait with SPC 50 feet X 2 with supervision, more lateral trunk lean observed    PATIENT EDUCATION:  02/22/2023 Education details: HEP, POC Person educated: Patient Education method: Programmer, multimedia, Demonstration, Verbal cues, and Handouts Education comprehension: verbalized understanding, returned demonstration, and verbal cues required  HOME EXERCISE PROGRAM: Access  Code: [email or web address] URL: [email or web address] Date: 02/22/2023 Prepared by: Chyrel Masson  Exercises - Seated Quad Set  - 2-3 x daily - 7 x weekly - 1 sets - 10 reps - 5 hold - Sit to Stand  - 3 x daily - 7 x weekly - 1 sets - 10 reps - Seated March  - 1-2 x daily - 7 x weekly - 1-2 sets - 10 reps  ASSESSMENT:  CLINICAL IMPRESSION: She has attended 13 PT visits and her initial [employer] authorization was for 12 visits so [employer] recert will be sent today. She has made overall progress in strength and ROM and standing/walking tolerance however this continues to be limited and she has not yet met her PT goals. PT recommending to continue PT another 4-6 weeks at 1-2 times per week to maximize function.  OBJECTIVE IMPAIRMENTS: Abnormal gait, decreased activity tolerance, decreased balance, decreased coordination, decreased endurance, decreased mobility, difficulty walking, decreased ROM, decreased strength, increased fascial restrictions, impaired perceived functional ability, impaired flexibility, improper body mechanics,  and pain.   ACTIVITY LIMITATIONS: carrying, lifting, bending, sitting, standing, squatting, sleeping, stairs, transfers, and locomotion level  PARTICIPATION LIMITATIONS: meal prep, cleaning, laundry, interpersonal relationship, driving, shopping, and community activity  PERSONAL FACTORS:  PMH includes Multiple sclerosis, cancer, DM, HTN.  are also affecting patient's functional outcome.   REHAB POTENTIAL: Good  CLINICAL DECISION MAKING: Stable/uncomplicated  EVALUATION COMPLEXITY: Low   GOALS: Goals reviewed with patient? Yes  SHORT TERM GOALS: (target date for Short term goals are 3 weeks 03/15/2023)   1.  Patient will demonstrate independent use of home exercise program to maintain progress from in clinic treatments.  Goal status: MET 03/09/23  LONG TERM GOALS: (target dates for all long term goals are 10 weeks  06/03/2023 )   1. Patient will demonstrate/report pain at worst less than or equal to 2/10 to facilitate minimal limitation in daily activity secondary to pain symptoms.  Goal status: ongoing 04/22/23, has been having less pain   2. Patient will demonstrate independent use of home exercise program to facilitate ability to maintain/progress functional gains from skilled physical therapy services.  Goal status: ongoing 04/05/23   3. Patient will demonstrate FOTO outcome > or = 53 % to indicate reduced disability due to condition.  Goal status: ongoing 04/22/23   4.  Patient will demonstrate Rt LE MMT 5/5 throughout to faciltiate usual transfers, stairs, squatting at [facility] for daily life.   Goal status: ongoing 04/05/23   5.  Patient will demonstrate ability to ascend and descend stairs reciprocally without UE assist for community /house integration.  Goal status: ongoing 04/22/23   6.  Patient will demonstrate TUG < 14 to indicate reduce fall risk.  Goal status: MET 04/05/23   7.  Patient will demonstrate independent ambulation community distances > 300 ft Goal Status: MET  04/05/23, but uses rollator when needed due to fatigue   PLAN:  PT FREQUENCY: 1-2x/week  PT DURATION: 10 weeks  PLANNED INTERVENTIONS: Therapeutic exercises, Therapeutic activity, Neuro Muscular re-education, Balance training, Gait training, Patient/Family education, Joint mobilization, Stair training, DME instructions, Dry Needling, Electrical stimulation, Traction, Cryotherapy, vasopneumatic deviceMoist heat, Taping, Ultrasound, Ionotophoresis 4mg /ml Dexamethasone, and Manual therapy.  All included unless contraindicated  PLAN FOR NEXT SESSION:   Gross movement improvements for strength, ROM, gait, aquatic PT as well   Ivery Quale, PT, DPT 04/22/23 10:01 AM

## 2023-05-02 ENCOUNTER — Encounter: Payer: Medicare PPO | Admitting: Physical Therapy

## 2023-05-02 IMAGING — CR DG HIP (WITH OR WITHOUT PELVIS) 2-3V*R*
3 series · 3 of 3 positions shown · non-contrast
Comparison: None.

CLINICAL DATA: Right hip pain.  No known injury.

EXAM:
DG HIP (WITH OR WITHOUT PELVIS) 2-3V RIGHT

[hip ap]
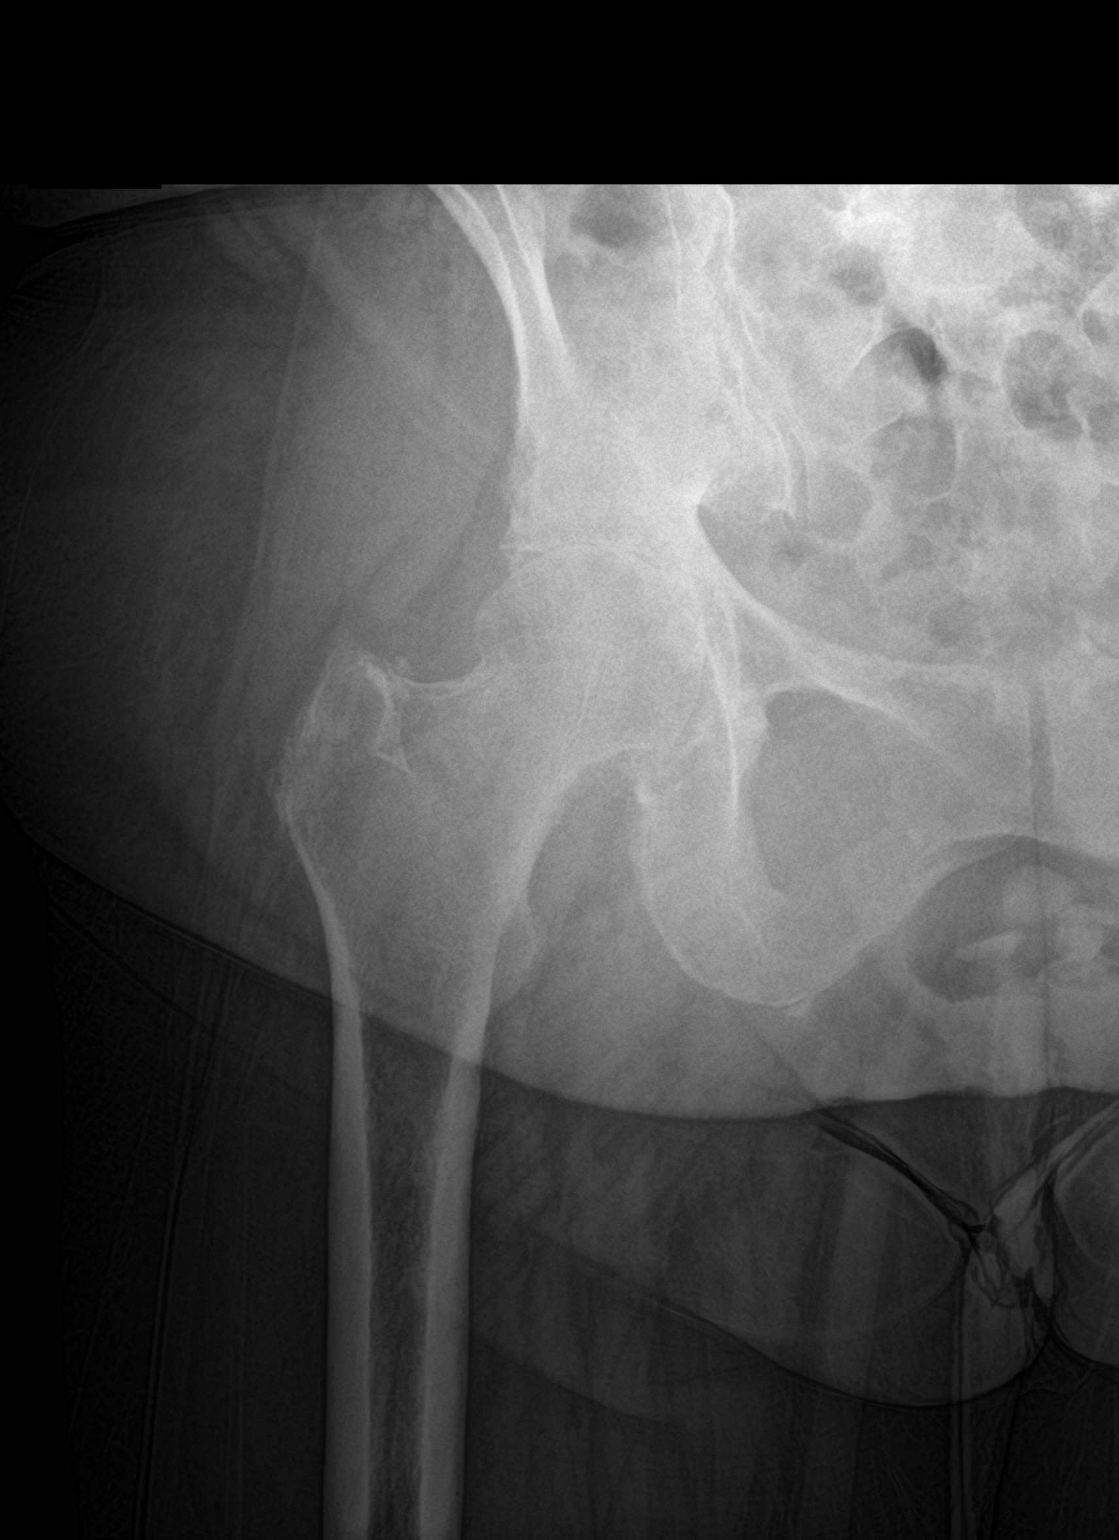

[pelvis ap]
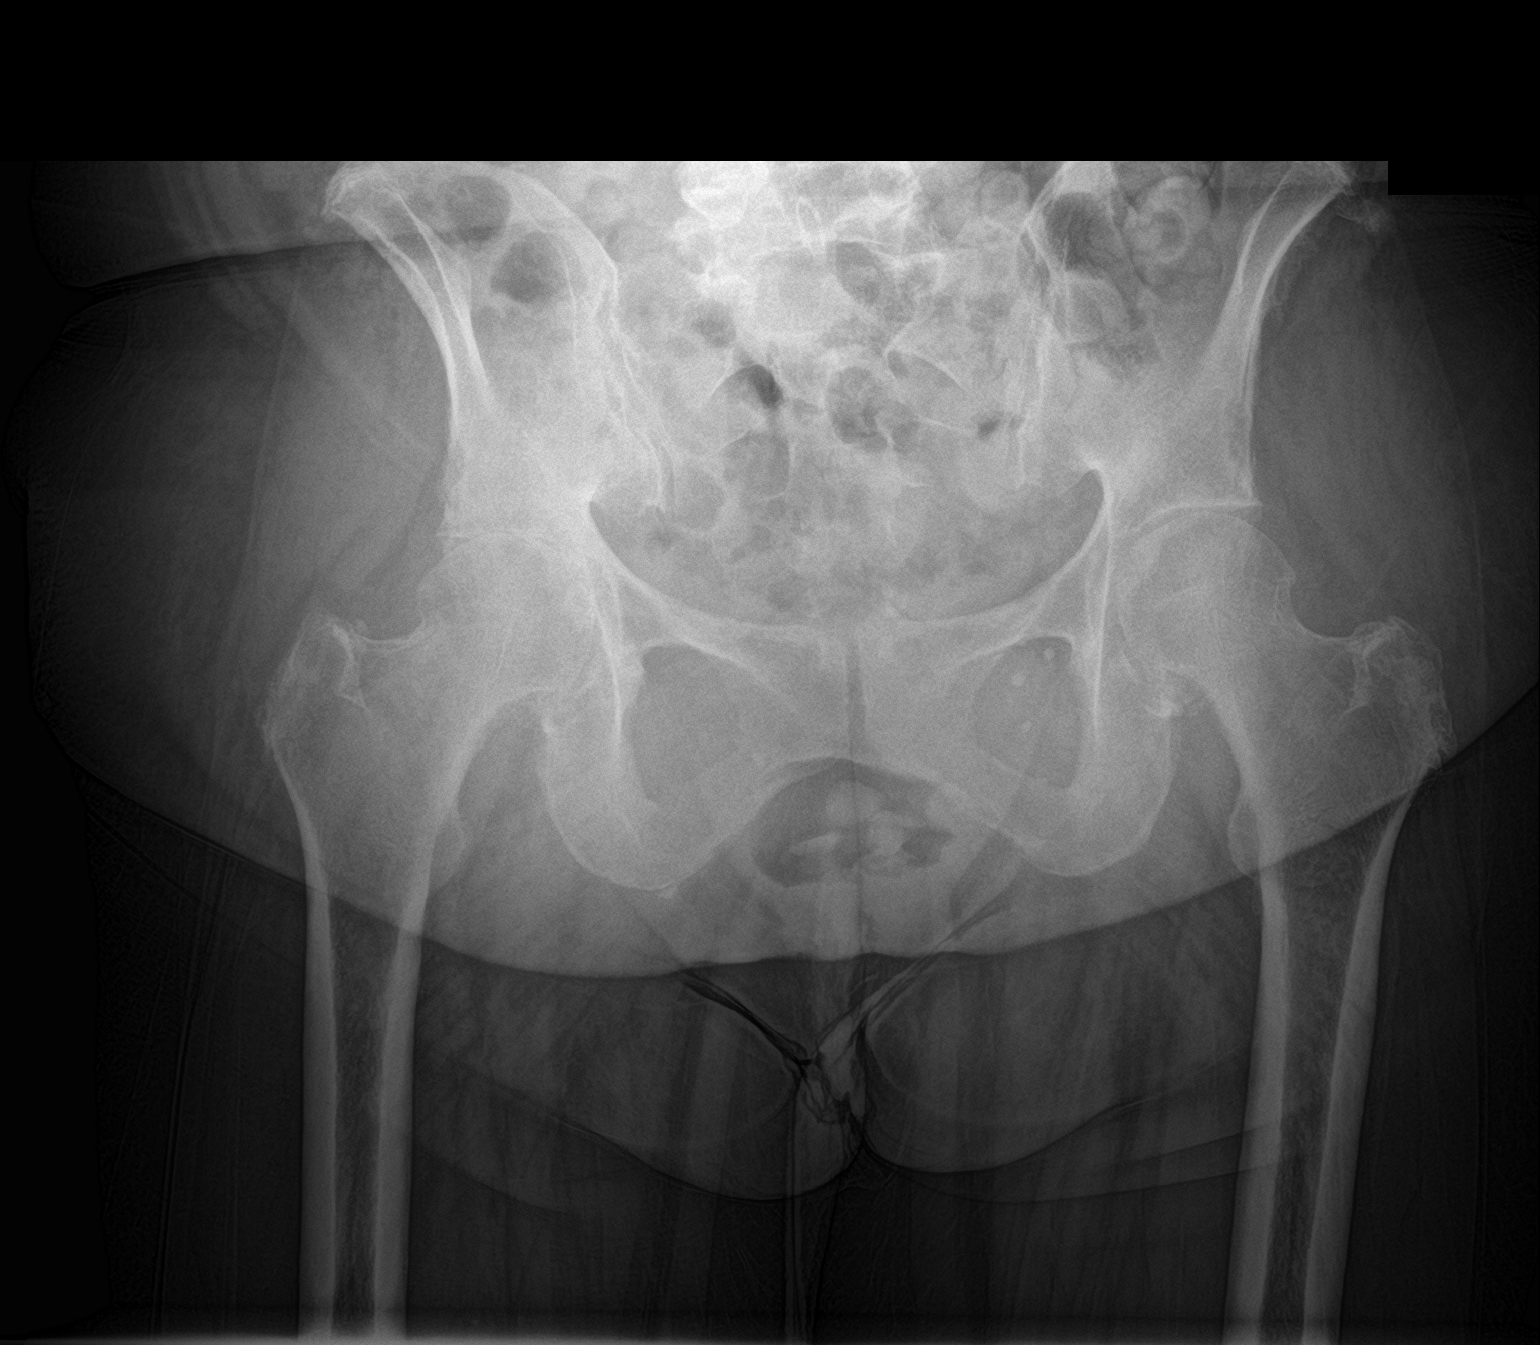

[hip lat]
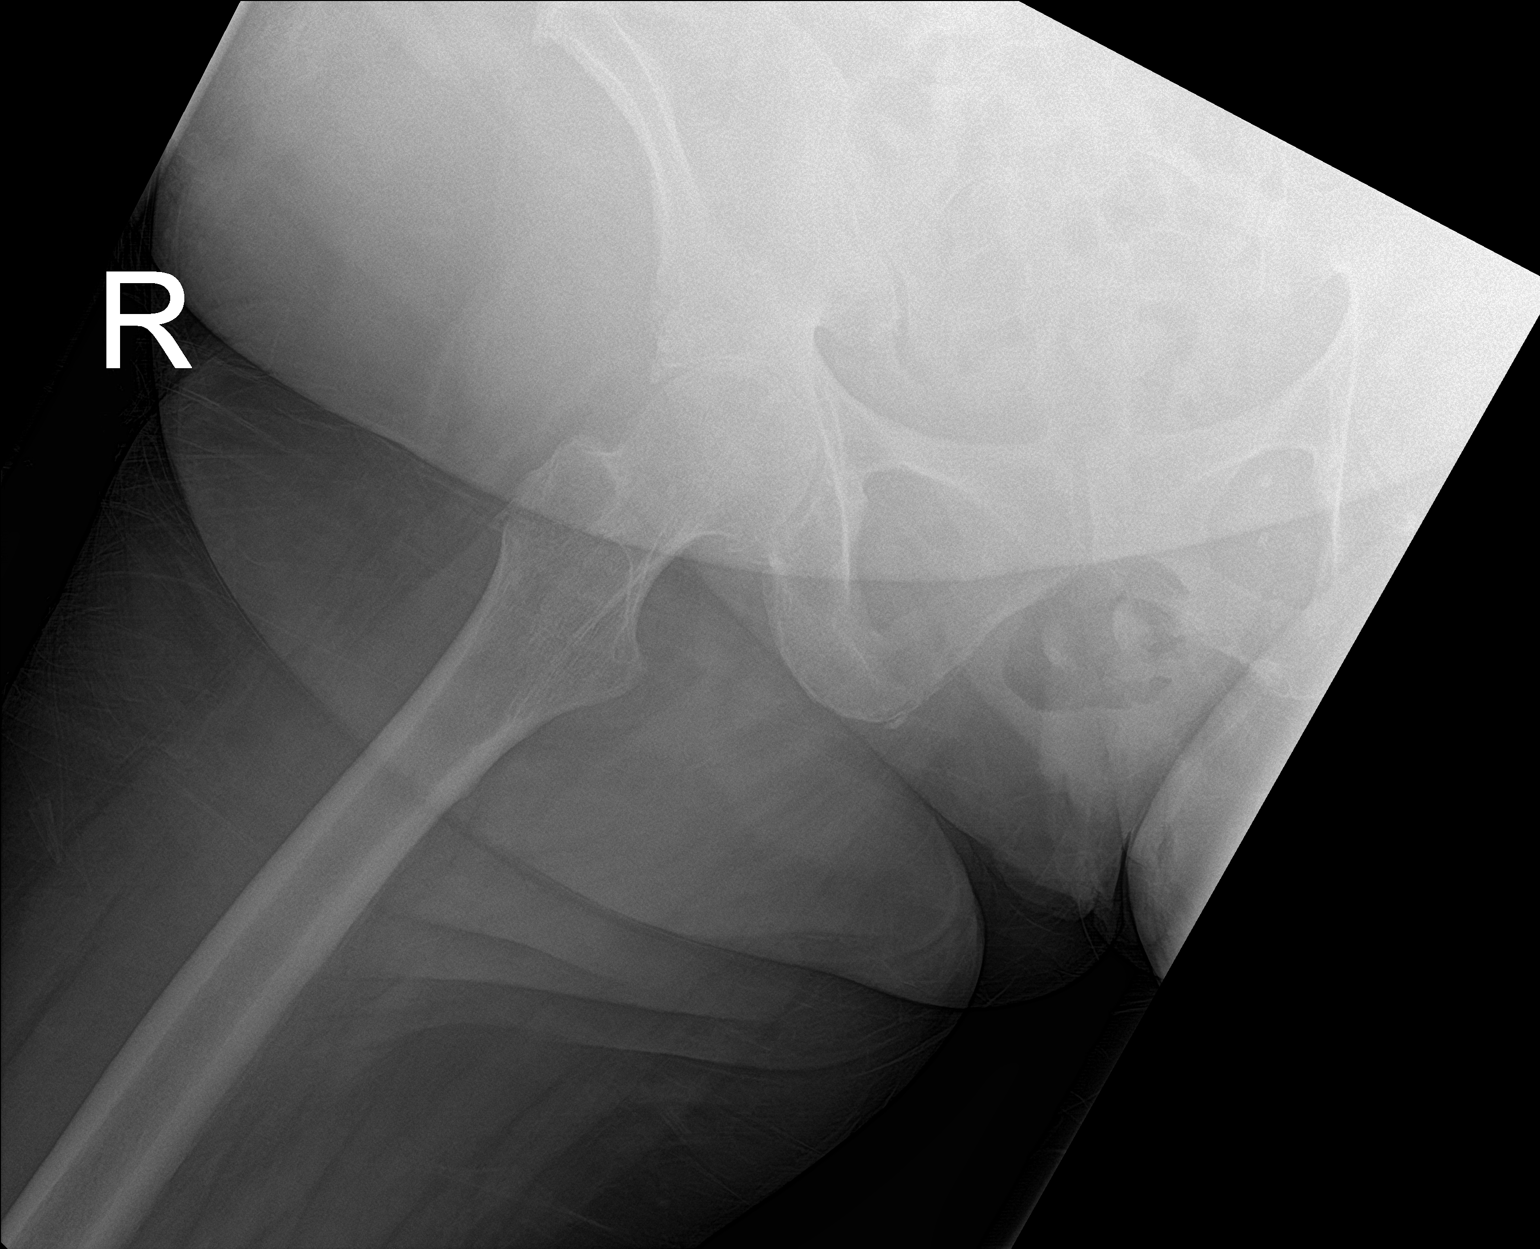

[3 of 3 positions shown; findings below may reference images not displayed]

FINDINGS: Advanced degenerative changes within the right hip with complete
joint space loss and spurring. Moderate degenerative changes in the
left hip. SI joints symmetric and unremarkable. No acute bony
abnormality. Specifically, no fracture, subluxation, or dislocation.
IMPRESSION: Moderate to advanced degenerative changes in the hips, right greater
than left. No acute bony abnormality.

## 2023-05-04 ENCOUNTER — Ambulatory Visit (INDEPENDENT_AMBULATORY_CARE_PROVIDER_SITE_OTHER): Payer: Medicare PPO | Admitting: Physical Therapy

## 2023-05-04 ENCOUNTER — Encounter: Payer: Self-pay | Admitting: Physical Therapy

## 2023-05-04 DIAGNOSIS — M6281 Muscle weakness (generalized): Secondary | ICD-10-CM

## 2023-05-04 DIAGNOSIS — M25551 Pain in right hip: Secondary | ICD-10-CM

## 2023-05-04 DIAGNOSIS — R262 Difficulty in walking, not elsewhere classified: Secondary | ICD-10-CM

## 2023-05-04 NOTE — Therapy (Signed)
OUTPATIENT PHYSICAL THERAPY TREATMENT  Referring diagnosis? W11.914 (ICD-10-CM) - Status post total replacement of right hip Treatment diagnosis? (if different than referring diagnosis) M25.551 What was this (referring dx) caused by? [x]  Surgery []  Fall []  Ongoing issue []  Arthritis []  Other: ____________   Laterality: [x]  Rt []  Lt []  Both   Check all possible CPT codes:                   *CHOOSE 10 OR LESS*                                []  97110 (Therapeutic Exercise)                   []  92507 (SLP Treatment)      []  97112 (Neuro Re-ed)                                []  92526 (Swallowing Treatment)                  []  97116 (Gait Training)                                []  [id number] (Cognitive Training, 1st 15 minutes) []  97140 (Manual Therapy)                           []  97130 (Cognitive Training, each add'l 15 minutes)         []  97164 (Re-evaluation)                              []  Other, List CPT Code ____________  []  78295 (Therapeutic Activities)                                            []  97535 (Self Care)               [x]  All codes above (97110 - 97535)             []  97012 (Mechanical Traction)             [x]  97014 (E-stim Unattended)             []  97032 (E-stim manual)             []  97033 (Ionto)             []  97035 (Ultrasound) [x]  97750 (Physical Performance Training) []  [id number] (Aquatic Therapy) []  97016 (Vasopneumatic Device) []  [id number] (Paraffin) []  97034 (Contrast Bath) []  97597 (Wound Care 1st 20 sq cm) []  97598 (Wound Care each add'l 20 sq cm) []  97760 (Orthotic Fabrication, Fitting, Training Initial) []  [id number] (Prosthetic Management and Training Initial) []  [id number] (Orthotic or Prosthetic Training/ Modification Subsequent)   Patient Name: Alisha Martinez MRN: [id number] DOB:07-05-1956, 67 y.o., female Today's Date: 05/04/2023  END OF SESSION:  PT End of Session - 05/04/23 1048     Visit Number 14    Number of Visits 20    Date for PT Re-Evaluation  06/03/23    Authorization Type [employer] $25 copay    Authorization Time Period  [employer]  [employer]  $25 COPAY Approved  Authorization #[id number]  Tracking #[id number] 12 PT VISITS 4/26-06/03/23    Authorization - Visit Number 1    Authorization - Number of Visits 12    Progress Note Due on Visit 20    PT Start Time 1049    PT Stop Time 1133    PT Time Calculation (min) 44 min    Activity Tolerance Patient tolerated treatment well    Behavior During Therapy WFL for tasks assessed/performed                  Past Medical History:  Diagnosis Date   Arthritis    Asthma    Cancer (HCC)    "cancerous cells found on pap smear- removed surgically"   Diabetes mellitus without complication (HCC)    Fibromyalgia    Hypertension    MS (multiple sclerosis) (HCC)    Sleep apnea    Past Surgical History:  Procedure Laterality Date   APPENDECTOMY     EYE SURGERY Bilateral 2023   "repaired torn retina"   KNEE SURGERY     TOTAL HIP ARTHROPLASTY Right 12/28/2022   Procedure: RIGHT TOTAL HIP ARTHROPLASTY ANTERIOR APPROACH;  Surgeon: Kathryne Hitch, MD;  Location: [facility] OR;  Service: Orthopedics;  Laterality: Right;   VAGINA SURGERY     "pt states the doctor surgically removed cancerous cells from vaginal area"   Patient Active Problem List   Diagnosis Date Noted   Diabetes mellitus without complication (HCC) 12/31/2022   Hypertension 12/31/2022   Asthma 12/31/2022   Sinus tachycardia 12/31/2022   Dyslipidemia 12/31/2022   OSA (obstructive sleep apnea) 12/31/2022   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   Other fatigue 11/22/2022   Multiple sclerosis (HCC) 10/26/2021   Pain in both lower extremities 10/26/2021   Urinary dysfunction 10/26/2021   Numbness 10/26/2021    PCP: None listed in epic  REFERRING PROVIDER: Kathryne Hitch, MD  REFERRING DIAG: [phone] (ICD-10-CM) - Status post total replacement of right hip  THERAPY DIAG:  Pain in right  hip  Muscle weakness (generalized)  Difficulty in walking, not elsewhere classified  Rationale for Evaluation and Treatment: Rehabilitation  ONSET DATE: 12/28/2022 surgery  SUBJECTIVE:   SUBJECTIVE STATEMENT: She relays some pulling pain .  PERTINENT HISTORY: PMH includes Multiple sclerosis, cancer, DM, HTN.  PAIN:  NPRS scale: 0/10 upon arrival but some pain if her ITB is stretched Pain location: Rt hip laterally/thigh Pain description: constant, burning  Aggravating factors: prolonged standing, walking, bending, squatting.  Relieving factors: rest  PRECAUTIONS: Anterior hip  WEIGHT BEARING RESTRICTIONS: No  FALLS:  Has patient fallen in last 6 months? No  LIVING ENVIRONMENT: Lives with: daughter, friend Lives in: House/apartment Stairs: 17 stairs with rail on Lt going up Has following equipment at home: Rollator and quad cane  OCCUPATION: Disability to retired  PLOF: Independent, line dancing (was limited by pain in hip prior to surgery), shopping, usually drives.   PATIENT GOALS: Reduce pain, get back to dancing, driving, walk independent.   OBJECTIVE:    PATIENT SURVEYS:  02/22/2023 FOTO intake:  43  predicted:  53  05/04/23 FOTO 40%  MUSCLE LENGTH: 02/22/2023 No specific measurements  PALPATION: 02/22/2023 Tenderness in lateral thigh musculature and greater trochanter, down lateral thigh  LOWER EXTREMITY ROM:   ROM Right 02/22/2023 Held for future assessment due to time RIght 02/24/2023 Right 04/05/23  Hip flexion  100 100  Hip extension  Hip abduction  30 35  Hip adduction  10   Hip internal rotation   25  Hip external rotation   20  Knee flexion     Knee extension     Ankle dorsiflexion     Ankle plantarflexion     Ankle inversion     Ankle eversion      (Blank rows = not tested)  LOWER EXTREMITY MMT:  MMT in sitting Right 02/24/2023 Right 03/23/23 Right 04/05/23  Hip flexion 3+ 4- 4  Hip extension     Hip abduction 3+ 4- 4  Hip  adduction     Hip internal rotation     Hip external rotation     Knee flexion 4 4+ 5  Knee extension 4 4+ 4+  Ankle dorsiflexion     Ankle plantarflexion     Ankle inversion     Ankle eversion      (Blank rows = not tested)   FUNCTIONAL TESTS:  02/22/2023  TUG c rollator:  16.75 18 inch chair transfer: s UE assist with increased difficulty.  Lt SLS: unable  Rt SLS: unable   04/05/23: TUG with rollator 8.9 seconds   GAIT: 02/22/2023 Rollator use in clinic c forwared trunk lean, decreased gait speed, decreased step length with Rt LE, decrease stance.    TODAY'S TREATMENT  DATE: 05/04/23 TherEx: Nu-step L6 X 10 min UE/LE Sidestepping at counter top with UE support 3 round trips with red band Standing hip extension X 15 bilat with red Standing hip flexion X 15 bilat with red Standing hip abduction X15 bilat with red band Step ups 6 inch step X 10 fwd and X 10 lateral performed bilat  with bilat UE support Sit to stand X 10 no UE support Seated SLR 2 X 10 bilat Leg press DL 16# [id number], then SL 04# 5W09 on Rt Gait with SPC 50 feet X 2 with supervision  04/22/23 Pt seen for aquatic therapy today.  Treatment took place in water 3.5-4.75 ft in depth at the [facility] pool. Temp of water was 91.  Pt entered/exited the pool via steps with both rails.  Pt requires the buoyancy and hydrostatic pressure of water for support, and to offload joints by unweighting joint load by at least 50 % in navel deep water and by at least 75-80% in chest to neck deep water.  Viscosity of the water is needed for resistance of strengthening. Water current perturbations provides challenge to standing balance requiring increased core activation.  Lateral walking (holding dumbells), forward walking (holding noodle), retro walking (holding noodle) 3 round trips in pool horizontal (30 feet) Leg swings add/abd X 20 bilat with UE support cues for slow and controlled movement Leg swings  flexion/extension X 20 bilat with UE support cues for slow and controlled movements Standing hamstring curls X 20 bilat with UE support Standing marches with UE support X 20 bilat Standing hamstring curls with UE support X 15 bilat 1/4 squat isometric hold with row/chest press holding kickboard X 15 reps 1/4 squat with shoulder extension/flexion holding kickboard  X20 reps Marching X 20 bilat Seated on bench performing bicycle kicks X 20, hip abd/add X 20, bilat hip/knee flexion/ext X 20 Step ups onto first step in pool X 15 bilat Squats on first step of pool X 15 reps   PATIENT EDUCATION:  02/22/2023 Education details: HEP, POC Person educated: Patient Education method: Explanation, Demonstration, Verbal cues, and Handouts Education comprehension: verbalized understanding, returned demonstration, and verbal  cues required  HOME EXERCISE PROGRAM: Access Code: [email or web address] URL: [email or web address] Date: 02/22/2023 Prepared by: Chyrel Masson  Exercises - Seated Quad Set  - 2-3 x daily - 7 x weekly - 1 sets - 10 reps - 5 hold - Sit to Stand  - 3 x daily - 7 x weekly - 1 sets - 10 reps - Seated March  - 1-2 x daily - 7 x weekly - 1-2 sets - 10 reps  ASSESSMENT:  CLINICAL IMPRESSION: She was having less overall pain today but is still limited with Rt hip strength and her activity tolerance for standing and walking which limites her ADL's. PT recommending to continue with current plan with activity progressions as tolerated.   OBJECTIVE IMPAIRMENTS: Abnormal gait, decreased activity tolerance, decreased balance, decreased coordination, decreased endurance, decreased mobility, difficulty walking, decreased ROM, decreased strength, increased fascial restrictions, impaired perceived functional ability, impaired flexibility, improper body mechanics, and pain.   ACTIVITY LIMITATIONS: carrying, lifting, bending, sitting, standing, squatting, sleeping, stairs, transfers, and  locomotion level  PARTICIPATION LIMITATIONS: meal prep, cleaning, laundry, interpersonal relationship, driving, shopping, and community activity  PERSONAL FACTORS:  PMH includes Multiple sclerosis, cancer, DM, HTN.  are also affecting patient's functional outcome.   REHAB POTENTIAL: Good  CLINICAL DECISION MAKING: Stable/uncomplicated  EVALUATION COMPLEXITY: Low   GOALS: Goals reviewed with patient? Yes  SHORT TERM GOALS: (target date for Short term goals are 3 weeks 03/15/2023)   1.  Patient will demonstrate independent use of home exercise program to maintain progress from in clinic treatments.  Goal status: MET 03/09/23  LONG TERM GOALS: (target dates for all long term goals are 10 weeks  06/03/2023 )   1. Patient will demonstrate/report pain at worst less than or equal to 2/10 to facilitate minimal limitation in daily activity secondary to pain symptoms.  Goal status: ongoing 04/22/23, has been having less pain   2. Patient will demonstrate independent use of home exercise program to facilitate ability to maintain/progress functional gains from skilled physical therapy services.  Goal status: ongoing 04/05/23   3. Patient will demonstrate FOTO outcome > or = 53 % to indicate reduced disability due to condition.  Goal status: ongoing 04/22/23   4.  Patient will demonstrate Rt LE MMT 5/5 throughout to faciltiate usual transfers, stairs, squatting at [facility] for daily life.   Goal status: ongoing 04/05/23   5.  Patient will demonstrate ability to ascend and descend stairs reciprocally without UE assist for community /house integration.  Goal status: ongoing 04/22/23   6.  Patient will demonstrate TUG < 14 to indicate reduce fall risk.  Goal status: MET 04/05/23   7.  Patient will demonstrate independent ambulation community distances > 300 ft Goal Status: MET 04/05/23, but uses rollator when needed due to fatigue   PLAN:  PT FREQUENCY: 1-2x/week  PT DURATION: 10 weeks  PLANNED  INTERVENTIONS: Therapeutic exercises, Therapeutic activity, Neuro Muscular re-education, Balance training, Gait training, Patient/Family education, Joint mobilization, Stair training, DME instructions, Dry Needling, Electrical stimulation, Traction, Cryotherapy, vasopneumatic deviceMoist heat, Taping, Ultrasound, Ionotophoresis 4mg /ml Dexamethasone, and Manual therapy.  All included unless contraindicated  PLAN FOR NEXT SESSION:   Gross movement improvements for strength, ROM, gait, aquatic PT as well   Ivery Quale, PT, DPT 05/04/23 11:01 AM

## 2023-05-09 ENCOUNTER — Encounter: Payer: Medicare PPO | Admitting: Physical Therapy

## 2023-05-11 ENCOUNTER — Encounter: Payer: Medicare PPO | Admitting: Physical Therapy

## 2023-05-17 ENCOUNTER — Encounter: Payer: Medicare PPO | Admitting: Physical Therapy

## 2023-05-20 ENCOUNTER — Other Ambulatory Visit: Payer: Self-pay | Admitting: Neurology

## 2023-05-20 ENCOUNTER — Ambulatory Visit (INDEPENDENT_AMBULATORY_CARE_PROVIDER_SITE_OTHER): Payer: Medicare PPO | Admitting: Physical Therapy

## 2023-05-20 ENCOUNTER — Encounter: Payer: Self-pay | Admitting: Physical Therapy

## 2023-05-20 ENCOUNTER — Other Ambulatory Visit: Payer: Self-pay | Admitting: Family Medicine

## 2023-05-20 DIAGNOSIS — R262 Difficulty in walking, not elsewhere classified: Secondary | ICD-10-CM | POA: Diagnosis not present

## 2023-05-20 DIAGNOSIS — Z1231 Encounter for screening mammogram for malignant neoplasm of breast: Secondary | ICD-10-CM

## 2023-05-20 DIAGNOSIS — M25551 Pain in right hip: Secondary | ICD-10-CM | POA: Diagnosis not present

## 2023-05-20 DIAGNOSIS — M6281 Muscle weakness (generalized): Secondary | ICD-10-CM

## 2023-05-20 NOTE — Therapy (Signed)
OUTPATIENT PHYSICAL THERAPY TREATMENT  Referring diagnosis? Z61.096 (ICD-10-CM) - Status post total replacement of right hip Treatment diagnosis? (if different than referring diagnosis) M25.551 What was this (referring dx) caused by? [x]  Surgery []  Fall []  Ongoing issue []  Arthritis []  Other: ____________   Laterality: [x]  Rt []  Lt []  Both   Check all possible CPT codes:                   *CHOOSE 10 OR LESS*                                []  97110 (Therapeutic Exercise)                   []  92507 (SLP Treatment)      []  97112 (Neuro Re-ed)                                []  92526 (Swallowing Treatment)                  []  97116 (Gait Training)                                []  [id number] (Cognitive Training, 1st 15 minutes) []  97140 (Manual Therapy)                           []  97130 (Cognitive Training, each add'l 15 minutes)         []  97164 (Re-evaluation)                              []  Other, List CPT Code ____________  []  97530 (Therapeutic Activities)                                            []  97535 (Self Care)               [x]  All codes above (97110 - 97535)             []  97012 (Mechanical Traction)             [x]  97014 (E-stim Unattended)             []  97032 (E-stim manual)             []  04540 (Ionto)             []  97035 (Ultrasound) [x]  97750 (Physical Performance Training) []  [id number] (Aquatic Therapy) []  97016 (Vasopneumatic Device) []  [id number] (Paraffin) []  97034 (Contrast Bath) []  97597 (Wound Care 1st 20 sq cm) []  97598 (Wound Care each add'l 20 sq cm) []  97760 (Orthotic Fabrication, Fitting, Training Initial) []  [id number] (Prosthetic Management and Training Initial) []  [id number] (Orthotic or Prosthetic Training/ Modification Subsequent)   Patient Name: Manhattan Constancio MRN: [id number] DOB:[birth year], 67 y.o., female Today's Date: 05/20/2023  END OF SESSION:  PT End of Session - 05/20/23 1023     Visit Number 15    Number of Visits 20    Date for PT Re-Evaluation  06/03/23    Authorization Type [employer] $25 copay    Authorization Time Period  [employer]  [employer]  $25 COPAY Approved  Authorization #[id number]  Tracking #[id number] PT VISITS 4/26-06/03/23    Authorization - Number of Visits 12    Progress Note Due on Visit 20    PT Start Time 1010    PT Stop Time 1050    PT Time Calculation (min) 40 min    Activity Tolerance Patient tolerated treatment well    Behavior During Therapy WFL for tasks assessed/performed                  Past Medical History:  Diagnosis Date   Arthritis    Asthma    Cancer (HCC)    "cancerous cells found on pap smear- removed surgically"   Diabetes mellitus without complication (HCC)    Fibromyalgia    Hypertension    MS (multiple sclerosis) (HCC)    Sleep apnea    Past Surgical History:  Procedure Laterality Date   APPENDECTOMY     EYE SURGERY Bilateral 2023   "repaired torn retina"   KNEE SURGERY     TOTAL HIP ARTHROPLASTY Right 12/28/2022   Procedure: RIGHT TOTAL HIP ARTHROPLASTY ANTERIOR APPROACH;  Surgeon: Kathryne Hitch, MD;  Location: [facility] OR;  Service: Orthopedics;  Laterality: Right;   VAGINA SURGERY     "pt states the doctor surgically removed cancerous cells from vaginal area"   Patient Active Problem List   Diagnosis Date Noted   Diabetes mellitus without complication (HCC) 12/31/2022   Hypertension 12/31/2022   Asthma 12/31/2022   Sinus tachycardia 12/31/2022   Dyslipidemia 12/31/2022   OSA (obstructive sleep apnea) 12/31/2022   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   Other fatigue 11/22/2022   Multiple sclerosis (HCC) 10/26/2021   Pain in both lower extremities 10/26/2021   Urinary dysfunction 10/26/2021   Numbness 10/26/2021    PCP: None listed in epic  REFERRING PROVIDER: Kathryne Hitch, MD  REFERRING DIAG: [phone] (ICD-10-CM) - Status post total replacement of right hip  THERAPY DIAG:  Pain in right hip  Muscle weakness  (generalized)  Difficulty in walking, not elsewhere classified  Rationale for Evaluation and Treatment: Rehabilitation  ONSET DATE: 12/28/2022 surgery  SUBJECTIVE:   SUBJECTIVE STATEMENT: She relays some pulling in her hip but not really having pain anyomore.   PERTINENT HISTORY: PMH includes Multiple sclerosis, cancer, DM, HTN.  PAIN:  NPRS scale: 0/10 upon arrival but some pain if her ITB is stretched Pain location: Rt hip laterally/thigh Pain description: constant, burning  Aggravating factors: prolonged standing, walking, bending, squatting.  Relieving factors: rest  PRECAUTIONS: Anterior hip  WEIGHT BEARING RESTRICTIONS: No  FALLS:  Has patient fallen in last 6 months? No  LIVING ENVIRONMENT: Lives with: daughter, friend Lives in: House/apartment Stairs: 17 stairs with rail on Lt going up Has following equipment at home: Rollator and quad cane  OCCUPATION: Disability to retired  PLOF: Independent, line dancing (was limited by pain in hip prior to surgery), shopping, usually drives.   PATIENT GOALS: Reduce pain, get back to dancing, driving, walk independent.   OBJECTIVE:    PATIENT SURVEYS:  02/22/2023 FOTO intake:  43  predicted:  53  05/04/23 FOTO 40%  MUSCLE LENGTH: 02/22/2023 No specific measurements  PALPATION: 02/22/2023 Tenderness in lateral thigh musculature and greater trochanter, down lateral thigh  LOWER EXTREMITY ROM:   ROM Right 02/22/2023 Held for future assessment due to time RIght 02/24/2023 Right 04/05/23  Hip flexion  100 100  Hip extension  Hip abduction  30 35  Hip adduction  10   Hip internal rotation   25  Hip external rotation   20  Knee flexion     Knee extension     Ankle dorsiflexion     Ankle plantarflexion     Ankle inversion     Ankle eversion      (Blank rows = not tested)  LOWER EXTREMITY MMT:  MMT in sitting Right 02/24/2023 Right 03/23/23 Right 04/05/23  Hip flexion 3+ 4- 4  Hip extension     Hip  abduction 3+ 4- 4  Hip adduction     Hip internal rotation     Hip external rotation     Knee flexion 4 4+ 5  Knee extension 4 4+ 4+  Ankle dorsiflexion     Ankle plantarflexion     Ankle inversion     Ankle eversion      (Blank rows = not tested)   FUNCTIONAL TESTS:  02/22/2023  TUG c rollator:  16.75 18 inch chair transfer: s UE assist with increased difficulty.  Lt SLS: unable  Rt SLS: unable   04/05/23: TUG with rollator 8.9 seconds   GAIT: 02/22/2023 Rollator use in clinic c forwared trunk lean, decreased gait speed, decreased step length with Rt LE, decrease stance.    TODAY'S TREATMENT  DATE: 05/20/23 Pt seen for aquatic therapy today.  Treatment took place in water 3.5-4.75 ft in depth at the [facility] pool. Temp of water was 91.  Pt entered/exited the pool via steps with both rails.  Pt requires the buoyancy and hydrostatic pressure of water for support, and to offload joints by unweighting joint load by at least 50 % in navel deep water and by at least 75-80% in chest to neck deep water.  Viscosity of the water is needed for resistance of strengthening. Water current perturbations provides challenge to standing balance requiring increased core activation.  Lateral walking, forward walking, retro walking (all holding noodle) 3 round trips in pool horizontal (30 feet) Leg swings add/abd X 20 bilat with UE support cues for slow and controlled movement Leg swings flexion/extension X 20 bilat with UE support cues for slow and controlled movements Standing hamstring curls X 15 bilat holding noodle Standing marches X 15 bilat with holding noodle 1/4 lunge stance isometric hold with row/chest press holding kickboard X 15 reps each side 1/4 squat with shoulder extension/flexion holding kickboard  X20 reps Seated on bench performing bicycle kicks X 20, hip abd/add X 20, bilat hip/knee flexion/ext X 20 Monster walk holding onto noodle 3 round trips Narrow walk (not  quite tandem as too difficult) holding onto noodle 1 tri[ Step ups onto first step in pool X 15 bilat Squats on first step of pool X 15 reps  05/04/23 TherEx: Nu-step L6 X 10 min UE/LE Sidestepping at counter top with UE support 3 round trips with red band Standing hip extension X 15 bilat with red Standing hip flexion X 15 bilat with red Standing hip abduction X15 bilat with red band Step ups 6 inch step X 10 fwd and X 10 lateral performed bilat  with bilat UE support Sit to stand X 10 no UE support Seated SLR 2 X 10 bilat Leg press DL 16# [id number], then SL 04# [id number] on Rt Gait with SPC 50 feet X 2 with supervision   PATIENT EDUCATION:  02/22/2023 Education details: HEP, POC Person educated: Patient Education method: Explanation, Demonstration, Verbal cues, and Handouts Education  comprehension: verbalized understanding, returned demonstration, and verbal cues required  HOME EXERCISE PROGRAM: Access Code: [email or web address] URL: [email or web address] Date: 02/22/2023 Prepared by: Chyrel Masson  Exercises - Seated Quad Set  - 2-3 x daily - 7 x weekly - 1 sets - 10 reps - 5 hold - Sit to Stand  - 3 x daily - 7 x weekly - 1 sets - 10 reps - Seated March  - 1-2 x daily - 7 x weekly - 1-2 sets - 10 reps  ASSESSMENT:  CLINICAL IMPRESSION: She had good tolerance to water exercise progressions working on hip ROM, strength, and balance post op hip THA. PT recommending to continue with current plan with activity progressions as tolerated. She will have to miss a week of PT as she his having eye surgery.   OBJECTIVE IMPAIRMENTS: Abnormal gait, decreased activity tolerance, decreased balance, decreased coordination, decreased endurance, decreased mobility, difficulty walking, decreased ROM, decreased strength, increased fascial restrictions, impaired perceived functional ability, impaired flexibility, improper body mechanics, and pain.   ACTIVITY LIMITATIONS: carrying, lifting, bending,  sitting, standing, squatting, sleeping, stairs, transfers, and locomotion level  PARTICIPATION LIMITATIONS: meal prep, cleaning, laundry, interpersonal relationship, driving, shopping, and community activity  PERSONAL FACTORS:  PMH includes Multiple sclerosis, cancer, DM, HTN.  are also affecting patient's functional outcome.   REHAB POTENTIAL: Good  CLINICAL DECISION MAKING: Stable/uncomplicated  EVALUATION COMPLEXITY: Low   GOALS: Goals reviewed with patient? Yes  SHORT TERM GOALS: (target date for Short term goals are 3 weeks 03/15/2023)   1.  Patient will demonstrate independent use of home exercise program to maintain progress from in clinic treatments.  Goal status: MET 03/09/23  LONG TERM GOALS: (target dates for all long term goals are 10 weeks  06/03/2023 )   1. Patient will demonstrate/report pain at worst less than or equal to 2/10 to facilitate minimal limitation in daily activity secondary to pain symptoms.  Goal status: ongoing 04/22/23, has been having less pain   2. Patient will demonstrate independent use of home exercise program to facilitate ability to maintain/progress functional gains from skilled physical therapy services.  Goal status: ongoing 04/05/23   3. Patient will demonstrate FOTO outcome > or = 53 % to indicate reduced disability due to condition.  Goal status: ongoing 04/22/23   4.  Patient will demonstrate Rt LE MMT 5/5 throughout to faciltiate usual transfers, stairs, squatting at [facility] for daily life.   Goal status: ongoing 04/05/23   5.  Patient will demonstrate ability to ascend and descend stairs reciprocally without UE assist for community /house integration.  Goal status: ongoing 04/22/23   6.  Patient will demonstrate TUG < 14 to indicate reduce fall risk.  Goal status: MET 04/05/23   7.  Patient will demonstrate independent ambulation community distances > 300 ft Goal Status: MET 04/05/23, but uses rollator when needed due to  fatigue   PLAN:  PT FREQUENCY: 1-2x/week  PT DURATION: 10 weeks  PLANNED INTERVENTIONS: Therapeutic exercises, Therapeutic activity, Neuro Muscular re-education, Balance training, Gait training, Patient/Family education, Joint mobilization, Stair training, DME instructions, Dry Needling, Electrical stimulation, Traction, Cryotherapy, vasopneumatic deviceMoist heat, Taping, Ultrasound, Ionotophoresis 4mg /ml Dexamethasone, and Manual therapy.  All included unless contraindicated  PLAN FOR NEXT SESSION:   Gross movement improvements for strength, ROM, gait, aquatic PT as well   Ivery Quale, PT, DPT 05/20/23 10:25 AM

## 2023-05-24 ENCOUNTER — Ambulatory Visit (INDEPENDENT_AMBULATORY_CARE_PROVIDER_SITE_OTHER): Payer: Medicare PPO | Admitting: Neurology

## 2023-05-24 ENCOUNTER — Encounter: Payer: Self-pay | Admitting: Neurology

## 2023-05-24 VITALS — BP 130/76 | HR 90 | Ht 64.0 in | Wt 208.0 lb

## 2023-05-24 DIAGNOSIS — H539 Unspecified visual disturbance: Secondary | ICD-10-CM

## 2023-05-24 DIAGNOSIS — R200 Anesthesia of skin: Secondary | ICD-10-CM

## 2023-05-24 DIAGNOSIS — R5383 Other fatigue: Secondary | ICD-10-CM

## 2023-05-24 DIAGNOSIS — R39198 Other difficulties with micturition: Secondary | ICD-10-CM

## 2023-05-24 DIAGNOSIS — G35 Multiple sclerosis: Secondary | ICD-10-CM | POA: Diagnosis not present

## 2023-05-24 MED ORDER — MIRABEGRON ER 50 MG PO TB24: 50.0000 mg | ORAL_TABLET | Freq: Every day | ORAL | 11 refills | Status: DC

## 2023-05-24 NOTE — Progress Notes (Signed)
You note we do sometimes use the  [facility]  PATIENT: Alisha Martinez DOB: [birth year]  REFERRING DOCTOR OR PCP: Knox Royalty, MD SOURCE: Patient, notes from [facility] neurology, imaging and lab reports  _________________________________   HISTORICAL  CHIEF COMPLAINT:  Chief Complaint  Patient presents with   Follow-up    Pt in room 10. Here for MS follow up    HISTORY OF PRESENT ILLNESS:  Alisha Martinez is a 67 y.o. woman with MS.  UPDATE 05/24/2023 She is on Rebif and tolerates it well..    In 2023, while in [place], she had an episode of slurred speech and weakness x a couple hours and her daughter thought her behavior was erratic x 2 days. She went to the ED had MRI of the cervical spine and brain.  Cervical spine was read as being normal and there is no spinal stenosis.  The brain MRI showed no acute findings and stable T2 hyperintense foci consistent with MS   She has not had any more spells.  She had hip surgery January 2024.  Her HR elevated her third day and EKG showed sinus tach and left bundle morphology.  Echo showed an apical scar.   She was placed on ASA and a statin  She had notice that vision worsening. She had cataract surgery in one eye 5/15 and  vision is better,   She will have the right eye operation tomorrow  She feels gait is btter since her surgery.   She uses a cane mostly and sometimes a walker.     She has less muscle spasms and takes baclofen.   She denies arm weakness but legs sometimes feel weak.  She has dysesthesias.   She takes pregabalin 100 mg po tid.   She had oxycodone during her recovery from surgery and has since stopped. She denies much clumsiness.     She has urinary frequency and is on 15 mg oxybutynin and Myrbetriq.  She saw urology and a nerve stimulator was discussed.    .   She has fatigue.   She has OSA and no longer uses CPAP  She prefers not to use CPAP.  She did not feel any better with CPAP.  She  wakes up frequently to use the bathroom.      She reports mild short term memory issues.    She denies depression now but had some in 2020 with Covid.     She has essential hypertension and type 2 NIDDM.      MRI cervical spine 03/27/2022 showed a normal spinal cord and mild DJD   MRI brain 03/27/2022 showed no acute findings.   She had stable T2 hyperintense foci.    She has hip DJD and saw Ortho and will be getting hip surgery.    There has been delay as medical clearance took a while to get an appt.       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   She needed to rest much more between tasks.   Over the previous 2 years, she also had issues with short term memory and tingling in her limbs.   She had MRI's in 2004 that were concerning for MS.  She was diagnosed in [place] at the [facility].    She never had an LP.   She was started on Rebif and has tolerated it well.  She  has had no major exacerbations.  However,  she reports a few exacerbations with leg and hand tingling or more pain in the legs.    She had her most recent 'exacerbation'  08/31/2021 and went to the ED. at the time, she was reporting itching and tingling throughout her body that started on 08/30/2021.  In the emergency room, she was given prednisone and oxycodone and she felt better a few hours later with resolution of symptoms back to her baseline.  She had no focal deficits in the emergency room.  Liver function test showed mild elevated transaminases.  CBC and UA were fine.  Of note, a review of imaging reports performed at [facility] between 2016 and 2019 showed nonspecific changes in the brain described as being more consistent with chronic microvascular ischemic change.  Although the MRI of the cervical spine from 2022 reportedly found patchy areas of abnormal signal within the cord, more conspicuous than prior  examinations, MRI from 2019 and 2017 showed a normal spinal cord. She was hospitalized with Covid-19 in July 2020 for 1 week.  She needed O2 but was not intubated.   She has a niece with MS.  Imaging  MRI cervical spine 03/27/2022 showed a normal spinal cord and mild DJD     MRI brain 03/27/2022 showed no acute findings.   She had stable T2 hyperintense foci.    MRI cervical spine 04/15/2021 showed 1 T2 hyperintense focus centrally towards the left adjacent to C3.  Some degenerative changes are noted but no significant spinal stenosis or nerve root compression.  MRI of the head 04/13/2021 showed  multiple T2/FLAIR hyperintense foci predominantly in the deep white matter but also in the periventricular and subcortical white matter.  Confluent foci are noted in the periatrial white matter as well as in the pons.  Some of the foci could be consistent with demyelination though the vast majority are more consistent with chronic microvascular ischemic change  MRI of the lumbar spine 09/03/2018 showed "Multilevel degenerative disc disease and facet arthropathy. Grade 1 lumbar canal stenosis at L3-4   Bilateral grade 1 foraminal stenosis at L4-5"  MRI of the brain 09/02/2018 showed "Mild to moderate periventricular and subcortical white matter T2/FLAIR hyperintensities are likely related to chronic small vessel ischemic disease.  There is no restricted diffusion to suggest acute infarct.  No hemorrhagic products "  MRI of the cervical spine 05/01/2018 showed "No focal abnormal cord signal or enhancement.  Mild degenerative changes, most pronounced at C4-C5 with facet arthropathy resulting in suspected bilateral neural foraminal narrowing. "  MRI of the thoracic spine 05/01/2018 showed "No definite abnormal spinal cord signal or enhancement.  Mild epidural lipomatosis, unchanged.  "  MRI of the brain 05/01/2018 showed "Unchanged moderate MS plaque burden when compared to 09/04/2015. No evidence for active demyelination.   Suspect superimposed chronic small vessel ischemic changes. "  MRI of the cervical spine 09/02/2016 showed "In the pons there is a fairly extensive signal abnormality redemonstrated and unchanged from the prior MRI C-spine on 04/08/2014.   The cervical spinal cord demonstrates normal signal morphology without any abnormal areas of enhancement. "  REVIEW OF SYSTEMS: Constitutional: No fevers, chills, sweats, or change in appetite.  She has fatigue. Eyes: No visual changes, double vision, eye pain Ear, nose and throat: No hearing loss, ear pain, nasal congestion, sore throat Cardiovascular: No chest pain, palpitations Respiratory:  No shortness of breath at rest or with exertion.   No wheezes.  She has obstructive sleep apnea and used to use CPAP but no longer does so. GastrointestinaI: No nausea, vomiting, diarrhea, abdominal pain, fecal incontinence Genitourinary:  No dysuria, urinary retention or frequency.  No nocturia. Musculoskeletal:  No neck pain, back pain Integumentary: No rash, pruritus, skin lesions Neurological: as above Psychiatric: No depression at this time.  No anxiety Endocrine: No palpitations, diaphoresis, change in appetite, change in weigh or increased thirst Hematologic/Lymphatic:  No anemia, purpura, petechiae. Allergic/Immunologic: No itchy/runny eyes, nasal congestion, recent allergic reactions, rashes  ALLERGIES: Allergies  Allergen Reactions   Compazine [Prochlorperazine]     Muscle pain   Lisinopril Swelling    HOME MEDICATIONS:  Current Outpatient Medications:    aspirin 81 MG chewable tablet, Chew 1 tablet (81 mg total) by mouth 2 (two) times daily., Disp: 30 tablet, Rfl: 0   atorvastatin (LIPITOR) 80 MG tablet, Take 80 mg by mouth daily., Disp: , Rfl:    baclofen (LIORESAL) 10 MG tablet, Take 10 mg by mouth 3 (three) times daily., Disp: , Rfl:    chlorthalidone (HYGROTON) 25 MG tablet, Take 25 mg by mouth daily., Disp: , Rfl:    diclofenac (VOLTAREN) 75  MG EC tablet, Take 75 mg by mouth 2 (two) times daily., Disp: , Rfl:    ergocalciferol (VITAMIN D2) 1.25 MG (50000 UT) capsule, Take 50,000 Units by mouth every Friday., Disp: , Rfl:    Interferon Beta-1a (REBIF REBIDOSE) 44 MCG/0.5ML SOAJ, Inject 0.5 mLs into the skin 3 (three) times a week., Disp: 36 mL, Rfl: 3   JARDIANCE 25 MG TABS tablet, Take 25 mg by mouth daily., Disp: , Rfl:    levocetirizine (XYZAL) 5 MG tablet, Take 5 mg by mouth every evening., Disp: , Rfl:    metFORMIN (GLUCOPHAGE) 1000 MG tablet, Take 1,000 mg by mouth 2 (two) times daily with a meal., Disp: , Rfl:    Metoprolol Tartrate 37.5 MG TABS, Take 37.5 mg by mouth 2 (two) times daily., Disp: , Rfl:    mirabegron ER (MYRBETRIQ) 50 MG TB24 tablet, Take 1 tablet (50 mg total) by mouth daily., Disp: 30 tablet, Rfl: 11   oxybutynin (DITROPAN XL) 15 MG 24 hr tablet, Take 15 mg by mouth at bedtime., Disp: , Rfl:    potassium chloride SA (KLOR-CON M) 20 MEQ tablet, Take 1 tablet (20 mEq total) by mouth 2 (two) times daily., Disp: 30 tablet, Rfl: 0   pregabalin (LYRICA) 100 MG capsule, Take 100 mg by mouth 3 (three) times daily., Disp: , Rfl:   PAST MEDICAL HISTORY: Past Medical History:  Diagnosis Date   Arthritis    Asthma    Cancer (HCC)    "cancerous cells found on pap smear- removed surgically"   Diabetes mellitus without complication (HCC)    Fibromyalgia    Hypertension    MS (multiple sclerosis) (HCC)    Sleep apnea     PAST SURGICAL HISTORY: Past Surgical History:  Procedure Laterality Date   APPENDECTOMY     EYE SURGERY Bilateral 2023   "repaired torn retina"   KNEE SURGERY     TOTAL HIP ARTHROPLASTY Right 12/28/2022   Procedure: RIGHT TOTAL HIP ARTHROPLASTY ANTERIOR APPROACH;  Surgeon: Kathryne Hitch, MD;  Location: [facility] OR;  Service: Orthopedics;  Laterality: Right;   VAGINA SURGERY     "pt states the doctor surgically removed cancerous cells from vaginal area"    FAMILY HISTORY: Family History   Problem Relation Age of Onset   Alopecia Mother  Cancer Mother    Cancer Father    Diabetes Father    Cancer Sister     SOCIAL HISTORY:  Social History   Socioeconomic History   Marital status: Divorced    Spouse name: Not on file   Number of children: 2   Years of education: Not on file   Highest education level: High school graduate  Occupational History   Not on file  Tobacco Use   Smoking status: Never   Smokeless tobacco: Never  Vaping Use   Vaping Use: Never used  Substance and Sexual Activity   Alcohol use: Not Currently   Drug use: Never   Sexual activity: Not on file  Other Topics Concern   Not on file  Social History Narrative   Lives w daughter   Right handed   Caffeine: occa drinks tea   Social Determinants of Health   Financial Resource Strain: Not on file  Food Insecurity: Not on file  Transportation Needs: Not on file  Physical Activity: Not on file  Stress: Not on file  Social Connections: Not on file  Intimate Partner Violence: Not on file     PHYSICAL EXAM  There were no vitals filed for this visit.   There is no height or weight on file to calculate BMI.   General: The patient is well-developed and well-nourished and in no acute distress  HEENT:  Head is [place]/AT.  Sclera are anicteric.   Skin: Extremities are without rash   She has mild edema at ankles, right > left  Neurologic Exam  Mental status: The patient is alert and oriented x 3 at the time of the examination. The patient has apparent normal recent and remote memory, with an apparently normal attention span and concentration ability.   Speech is normal.  Cranial nerves: Extraocular movements are full.  Facial strength and sensation is normal.. No obvious hearing deficits are noted.  Motor:  Muscle bulk is normal.   Tone is normal. Strength is  5 / 5 in all 4 extremities.   Sensory: Sensory testing is intact to soft touch and vibration sensation in all 4  extremities.  Coordination: Cerebellar testing reveals good finger-nose-finger and heel-to-shin bilaterally.  Gait and station: Station is normal.   Gait is arthritic and mildly wide.  Cannot tandem walk.  Romberg is negative.    Reflexes: Deep tendon reflexes are symmetric and normal in arms, trace at knees and absent in ankles.  Marland Kitchen    DIAGNOSTIC DATA (LABS, IMAGING, TESTING) - I reviewed patient records, labs, notes, testing and imaging myself where available.  Lab Results  Component Value Date   WBC 9.4 01/02/2023   HGB 12.5 01/02/2023   HCT 37.3 01/02/2023   MCV 87.8 01/02/2023   PLT 255 01/02/2023      Component Value Date/Time   NA 136 01/02/2023 0252   K 3.7 01/02/2023 0252   CL 99 01/02/2023 0252   CO2 26 01/02/2023 0252   GLUCOSE 132 (H) 01/02/2023 0252   BUN 22 01/02/2023 0252   CREATININE 0.87 01/02/2023 0252   CALCIUM 9.3 01/02/2023 0252   PROT 7.4 01/02/2023 0252   ALBUMIN 2.9 (L) 01/02/2023 0252   AST 33 01/02/2023 0252   ALT 27 01/02/2023 0252   ALKPHOS 56 01/02/2023 0252   BILITOT 0.4 01/02/2023 0252   GFRNONAA >60 01/02/2023 0252       ASSESSMENT AND PLAN  Multiple sclerosis (HCC)  Vision disturbance  Numbness  Urinary dysfunction  Other  fatigue   She will continue Rebif.   Consider another MRI later in 2025 to see if further progression.   She is not interested in stopping the medication as she is stable Vision better after cataract surgery The active and exercise as tolerated. Return in 6 months or sooner if new or worsening symptoms.  Arbie Blankley A. Epimenio Foot, MD, [facility] 05/24/2023, 8:32 AM Certified in Neurology, Clinical Neurophysiology, Sleep Medicine and Neuroimaging  [facility] [address] [place], [place] [address] [phone]

## 2023-05-24 NOTE — Telephone Encounter (Signed)
Pt was seen today on 05/24/23 Rx was sent at the visit today #30 tablets with 11 refills.  Rx denied

## 2023-06-03 ENCOUNTER — Ambulatory Visit: Payer: Medicare PPO | Admitting: Physical Therapy

## 2023-06-12 NOTE — Therapy (Signed)
OUTPATIENT PHYSICAL THERAPY TREATMENT/re-eval  Referring diagnosis? Z61.096 (ICD-10-CM) - Status post total replacement of right hip Treatment diagnosis? (if different than referring diagnosis) M25.551 What was this (referring dx) caused by? [x]  Surgery []  Fall []  Ongoing issue []  Arthritis []  Other: ____________   Laterality: [x]  Rt []  Lt []  Both   Check all possible CPT codes:                   *CHOOSE 10 OR LESS*                                []  97110 (Therapeutic Exercise)                   []  92507 (SLP Treatment)      []  97112 (Neuro Re-ed)                                []  92526 (Swallowing Treatment)                  []  97116 (Gait Training)                                []  [id number] (Cognitive Training, 1st 15 minutes) []  97140 (Manual Therapy)                           []  97130 (Cognitive Training, each add'l 15 minutes)         []  97164 (Re-evaluation)                              []  Other, List CPT Code ____________  []  97530 (Therapeutic Activities)                                            []  04540 (Self Care)               [x]  All codes above (97110 - 97535)             []  97012 (Mechanical Traction)             []  97014 (E-stim Unattended)             []  97032 (E-stim manual)             []  97033 (Ionto)             []  97035 (Ultrasound) [x]  97750 (Physical Performance Training) []  [id number] (Aquatic Therapy) []  97016 (Vasopneumatic Device) []  [id number] (Paraffin) []  97034 (Contrast Bath) []  97597 (Wound Care 1st 20 sq cm) []  97598 (Wound Care each add'l 20 sq cm) []  97760 (Orthotic Fabrication, Fitting, Training Initial) []  [id number] (Prosthetic Management and Training Initial) []  [id number] (Orthotic or Prosthetic Training/ Modification Subsequent)   Patient Name: Alisha Martinez MRN: [id number] DOB:1956-10-22, 67 y.o., female Today's Date: 06/13/2023  END OF SESSION:  PT End of Session - 06/13/23 0943     Visit Number 16    Number of Visits 20    Date for PT  Re-Evaluation 06/13/23    Authorization Type [employer] $25 copay    Authorization Time Period  TAXI V[employer]CHER  [employer]  $25 COPAY Approved  Authorization #[id number]  Tracking #[id number] PT VISITS 4/26-06/03/23    Authorization - Number of Visits 12    Progress Note Due on Visit 20    PT Start Time [phone]   pt arrived late to her appointment   PT Stop Time 1015    PT Time Calculation (min) 36 min    Activity Tolerance Patient tolerated treatment well    Behavior During Therapy WFL for tasks assessed/performed                   Past Medical History:  Diagnosis Date   Arthritis    Asthma    Cancer (HCC)    "cancerous cells found on pap smear- removed surgically"   Diabetes mellitus without complication (HCC)    Fibromyalgia    Hypertension    MS (multiple sclerosis) (HCC)    Sleep apnea    Past Surgical History:  Procedure Laterality Date   APPENDECTOMY     EYE SURGERY Bilateral 2023   "repaired torn retina"   KNEE SURGERY     TOTAL HIP ARTHROPLASTY Right 12/28/2022   Procedure: RIGHT TOTAL HIP ARTHROPLASTY ANTERIOR APPROACH;  Surgeon: Kathryne Hitch, MD;  Location: [facility] OR;  Service: Orthopedics;  Laterality: Right;   VAGINA SURGERY     "pt states the doctor surgically removed cancerous cells from vaginal area"   Patient Active Problem List   Diagnosis Date Noted   Diabetes mellitus without complication (HCC) 12/31/2022   Hypertension 12/31/2022   Asthma 12/31/2022   Sinus tachycardia 12/31/2022   Dyslipidemia 12/31/2022   OSA (obstructive sleep apnea) 12/31/2022   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   Other fatigue 11/22/2022   Multiple sclerosis (HCC) 10/26/2021   Pain in both lower extremities 10/26/2021   Urinary dysfunction 10/26/2021   Numbness 10/26/2021    PCP: None listed in epic  REFERRING PROVIDER: Kathryne Hitch, MD  REFERRING DIAG: [phone] (ICD-10-CM) - Status post total replacement of right hip  THERAPY DIAG:   Pain in right hip - Plan: PT plan of care cert/re-cert  Muscle weakness (generalized) - Plan: PT plan of care cert/re-cert  Difficulty in walking, not elsewhere classified - Plan: PT plan of care cert/re-cert  Rationale for Evaluation and Treatment: Rehabilitation  ONSET DATE: 12/28/2022 surgery  SUBJECTIVE:   SUBJECTIVE STATEMENT: She relays some pulling in her hip but not really having pain anyomore.   PERTINENT HISTORY: PMH includes Multiple sclerosis, cancer, DM, HTN.  PAIN:  NPRS scale: 0/10 upon arrival but some pain if her ITB is stretched Pain location: Rt hip laterally/thigh Pain description: constant, burning  Aggravating factors: prolonged standing, walking, bending, squatting.  Relieving factors: rest  PRECAUTIONS: Anterior hip  WEIGHT BEARING RESTRICTIONS: No  FALLS:  Has patient fallen in last 6 months? No  LIVING ENVIRONMENT: Lives with: daughter, friend Lives in: House/apartment Stairs: 17 stairs with rail on Lt going up Has following equipment at home: Rollator and quad cane  OCCUPATION: Disability to retired  PLOF: Independent, line dancing (was limited by pain in hip prior to surgery), shopping, usually drives.   PATIENT GOALS: Reduce pain, get back to dancing, driving, walk independent.   OBJECTIVE:    PATIENT SURVEYS:  02/22/2023 FOTO intake:  43  predicted:  53  05/04/23 FOTO 40% 06/13/23 72 GOAL MET  MUSCLE LENGTH: 02/22/2023 No specific measurements  PALPATION: 02/22/2023 Tenderness in lateral thigh musculature and greater trochanter, down lateral  thigh  LOWER EXTREMITY ROM:   ROM Right 02/22/2023 Held for future assessment due to time RIght 02/24/2023 Right 04/05/23  Hip flexion  100 100  Hip extension     Hip abduction  30 35  Hip adduction  10   Hip internal rotation   25  Hip external rotation   20  Knee flexion     Knee extension     Ankle dorsiflexion     Ankle plantarflexion     Ankle inversion     Ankle eversion       (Blank rows = not tested) 06/13/23: Rt hip flexion 120 deg  Rt hip abduction 45 deg   LOWER EXTREMITY MMT:  MMT in sitting Right 02/24/2023 Right 03/23/23 Right 04/05/23 Rt  06/13/23  Hip flexion 3+ 4- 4 3  Hip extension      Hip abduction 3+ 4- 4 4+  Hip adduction      Hip internal rotation      Hip external rotation      Knee flexion 4 4+ 5 5  Knee extension 4 4+ 4+ 5  Ankle dorsiflexion      Ankle plantarflexion      Ankle inversion      Ankle eversion       (Blank rows = not tested)   FUNCTIONAL TESTS:  02/22/2023  TUG c rollator:  16.75 18 inch chair transfer: s UE assist with increased difficulty.  Lt SLS: unable  Rt SLS: unable   04/05/23: TUG with rollator 8.9 seconds  06/13/23: Lt and Rt able to lift without holding    GAIT: 02/22/2023 Rollator use in clinic c forwared trunk lean, decreased gait speed, decreased step length with Rt LE, decrease stance.    TODAY'S TREATMENT  DATE: 06/13/23 MMT Review of current HEP and benefits of being consistent with this after D/C Aquatic exercise benefits  Seated clam red TB x15 Standing SLS with intermittent UE support  Standing march HEP demo Ambulation with/without AD and discussed how mechanics improve with proper arm swing  05/20/23 Pt seen for aquatic therapy today.  Treatment took place in water 3.5-4.75 ft in depth at the [facility] pool. Temp of water was 91.  Pt entered/exited the pool via steps with both rails.  Pt requires the buoyancy and hydrostatic pressure of water for support, and to offload joints by unweighting joint load by at least 50 % in navel deep water and by at least 75-80% in chest to neck deep water.  Viscosity of the water is needed for resistance of strengthening. Water current perturbations provides challenge to standing balance requiring increased core activation.  Lateral walking, forward walking, retro walking (all holding noodle) 3 round trips in pool horizontal (30 feet) Leg  swings add/abd X 20 bilat with UE support cues for slow and controlled movement Leg swings flexion/extension X 20 bilat with UE support cues for slow and controlled movements Standing hamstring curls X 15 bilat holding noodle Standing marches X 15 bilat with holding noodle 1/4 lunge stance isometric hold with row/chest press holding kickboard X 15 reps each side 1/4 squat with shoulder extension/flexion holding kickboard  X20 reps Seated on bench performing bicycle kicks X 20, hip abd/add X 20, bilat hip/knee flexion/ext X 20 Monster walk holding onto noodle 3 round trips Narrow walk (not quite tandem as too difficult) holding onto noodle 1 tri[ Step ups onto first step in pool X 15 bilat Squats on first step of pool X 15 reps  05/04/23 TherEx: Nu-step L6 X 10 min UE/LE Sidestepping at counter top with UE support 3 round trips with red band Standing hip extension X 15 bilat with red Standing hip flexion X 15 bilat with red Standing hip abduction X15 bilat with red band Step ups 6 inch step X 10 fwd and X 10 lateral performed bilat  with bilat UE support Sit to stand X 10 no UE support Seated SLR 2 X 10 bilat Leg press DL 16# [id number], then SL 04# [id number] on Rt Gait with SPC 50 feet X 2 with supervision   PATIENT EDUCATION:  02/22/2023 Education details: HEP updated; discharge from PT Person educated: Patient Education method: Explanation, Demonstration, Verbal cues, and Handouts Education comprehension: verbalized understanding, returned demonstration, and verbal cues required  HOME EXERCISE PROGRAM: Access Code: [email or web address] URL: [email or web address] Date: 02/22/2023 Prepared by: Chyrel Masson  Exercises - Seated Quad Set  - 2-3 x daily - 7 x weekly - 1 sets - 10 reps - 5 hold - Sit to Stand  - 3 x daily - 7 x weekly - 1 sets - 10 reps - Seated March  - 1-2 x daily - 7 x weekly - 1-2 sets - 10 reps  ASSESSMENT:  CLINICAL IMPRESSION: Pt arrived a little late to her  appointment today. She notes that she has been doing well, denying hip pain and issues throughout the day. Reviewed goals and she has met all but 1 goal at this time. She denies trouble with stairs and is able to ambulate without an AD. Pt does have some Rt hip flexion weakness but this is greatly improved from her evaluation. HEP was updated to further address this moving forward. Pt is pleased with her progress and is agreeable with discharge. She plans to resume her HEP and aquatic exercise to maintain all progress made during her PT POC.  OBJECTIVE IMPAIRMENTS: Abnormal gait, decreased activity tolerance, decreased balance, decreased coordination, decreased endurance, decreased mobility, difficulty walking, decreased ROM, decreased strength, increased fascial restrictions, impaired perceived functional ability, impaired flexibility, improper body mechanics, and pain.   ACTIVITY LIMITATIONS: carrying, lifting, bending, sitting, standing, squatting, sleeping, stairs, transfers, and locomotion level  PARTICIPATION LIMITATIONS: meal prep, cleaning, laundry, interpersonal relationship, driving, shopping, and community activity  PERSONAL FACTORS:  PMH includes Multiple sclerosis, cancer, DM, HTN.  are also affecting patient's functional outcome.   REHAB POTENTIAL: Good  CLINICAL DECISION MAKING: Stable/uncomplicated  EVALUATION COMPLEXITY: Low   GOALS: Goals reviewed with patient? Yes  SHORT TERM GOALS: (target date for Short term goals are 3 weeks 03/15/2023)   1.  Patient will demonstrate independent use of home exercise program to maintain progress from in clinic treatments.  Goal status: MET 03/09/23  LONG TERM GOALS: (target dates for all long term goals are 10 weeks  06/03/2023 )   1. Patient will demonstrate/report pain at worst less than or equal to 2/10 to facilitate minimal limitation in daily activity secondary to pain symptoms.  Goal status: ongoing 04/22/23, has been having less  pain MET: no pain noted   2. Patient will demonstrate independent use of home exercise program to facilitate ability to maintain/progress functional gains from skilled physical therapy services.  Goal status: ongoing 04/05/23 PARTIALLY MET: doing HEP maybe 2-3x/week   3. Patient will demonstrate FOTO outcome > or = 53 % to indicate reduced disability due to condition.  Goal status: ongoing 04/22/23   4.  Patient will demonstrate Rt LE MMT 5/5 throughout to faciltiate usual transfers, stairs, squatting  at [facility] for daily life.   Goal status: ongoing 04/05/23 PARTIALLY MET: Rt hip flexion   5.  Patient will demonstrate ability to ascend and descend stairs reciprocally without UE assist for community /house integration.  Goal status: ongoing 04/22/23 MET: Pt states she has no issues with stairs at home   6.  Patient will demonstrate TUG < 14 to indicate reduce fall risk.  Goal status: MET 04/05/23   7.  Patient will demonstrate independent ambulation community distances > 300 ft Goal Status: MET 04/05/23, but uses rollator when needed due to fatigue   PLAN:  PT FREQUENCY: 1-2x/week  PT DURATION: 10 weeks  PLANNED INTERVENTIONS: Therapeutic exercises, Therapeutic activity, Neuro Muscular re-education, Balance training, Gait training, Patient/Family education, Joint mobilization, Stair training, DME instructions, Dry Needling, Electrical stimulation, Traction, Cryotherapy, vasopneumatic deviceMoist heat, Taping, Ultrasound, Ionotophoresis 4mg /ml Dexamethasone, and Manual therapy.  All included unless contraindicated  PLAN FOR NEXT SESSION:  d/c from PT  PHYSICAL THERAPY DISCHARGE SUMMARY  Visits from Start of Care: 16  Current functional level related to goals / functional outcomes: See above for more details    Remaining deficits: See above for more details    Education / Equipment: See above for more details    Patient agrees to discharge. Patient goals were met. Patient is  being discharged due to being pleased with the current functional level.   2:59 PM,06/13/23 Donita Brooks PT, DPT [facility] [place]  [phone]

## 2023-06-13 ENCOUNTER — Encounter: Payer: Self-pay | Admitting: Physical Therapy

## 2023-06-13 ENCOUNTER — Ambulatory Visit (INDEPENDENT_AMBULATORY_CARE_PROVIDER_SITE_OTHER): Payer: Medicare PPO | Admitting: Physical Therapy

## 2023-06-13 DIAGNOSIS — M6281 Muscle weakness (generalized): Secondary | ICD-10-CM

## 2023-06-13 DIAGNOSIS — M25551 Pain in right hip: Secondary | ICD-10-CM

## 2023-06-13 DIAGNOSIS — R262 Difficulty in walking, not elsewhere classified: Secondary | ICD-10-CM | POA: Diagnosis not present

## 2023-06-15 ENCOUNTER — Ambulatory Visit: Payer: Medicare PPO

## 2023-06-27 ENCOUNTER — Telehealth: Payer: Self-pay | Admitting: Neurology

## 2023-06-27 MED ORDER — MIRABEGRON ER 50 MG PO TB24: 50.0000 mg | ORAL_TABLET | Freq: Every day | ORAL | 1 refills | Status: DC

## 2023-06-27 NOTE — Telephone Encounter (Signed)
Last seen on 05/24/23 Follow up scheduled on 12/01/23

## 2023-06-27 NOTE — Telephone Encounter (Signed)
Pt requesting refill on mirabegron ER (MYRBETRIQ) 50 MG TB24 tablet. Should be sen to [id number]. Pt is requesting a 90 day supply.

## 2023-07-26 ENCOUNTER — Inpatient Hospital Stay: Admission: RE | Admit: 2023-07-26 | Payer: Medicare PPO | Source: Ambulatory Visit

## 2023-08-01 ENCOUNTER — Ambulatory Visit: Payer: Medicare PPO | Admitting: Orthopaedic Surgery

## 2023-08-10 ENCOUNTER — Encounter: Payer: Self-pay | Admitting: Orthopaedic Surgery

## 2023-08-10 ENCOUNTER — Other Ambulatory Visit: Payer: Self-pay

## 2023-08-10 ENCOUNTER — Other Ambulatory Visit (INDEPENDENT_AMBULATORY_CARE_PROVIDER_SITE_OTHER): Payer: Medicare PPO

## 2023-08-10 ENCOUNTER — Ambulatory Visit: Payer: Medicare PPO | Admitting: Orthopaedic Surgery

## 2023-08-10 DIAGNOSIS — M5441 Lumbago with sciatica, right side: Secondary | ICD-10-CM | POA: Diagnosis not present

## 2023-08-10 DIAGNOSIS — G8929 Other chronic pain: Secondary | ICD-10-CM | POA: Diagnosis not present

## 2023-08-10 DIAGNOSIS — M25551 Pain in right hip: Secondary | ICD-10-CM | POA: Diagnosis not present

## 2023-08-10 NOTE — Progress Notes (Signed)
The patient is now 7 months status post a right direct anterior total hip arthroplasty.  She states the hip is doing well and she has good range of motion and no significant discomfort.  She has no issues with the right hip and said it is doing much better since surgery.  However she is having a lot of issues with low back pain especially to the right side and right-sided sciatica with the burning pain into her ischial area on the right side is where she points.  On exam her right operative hip moves smoothly and fluidly.  She does have a lot of pain in the lower lumbar spine and especially to the right side.  There is no weakness in her legs.  An AP pelvis and lateral of the right hip shows a well-seated total hip arthroplasty with no complicating features.  He does on these films showed the lower aspect of her lumbar spine which shows significant degeneration and curvature.  We need to for send her to physical therapy for core strengthening and back exercises with any modalities to help calm down her right-sided sciatica.  She has had aquatic therapy before she was she would also benefit from aquatic therapy.  She also would benefit from weight loss.  We will see her back in 6 weeks after course of physical therapy for just her back.  At her next visit I would like an AP and lateral of her lumbar spine.

## 2023-08-12 ENCOUNTER — Ambulatory Visit: Payer: Medicare PPO | Admitting: Internal Medicine

## 2023-09-02 ENCOUNTER — Ambulatory Visit: Payer: Medicare PPO | Attending: Internal Medicine | Admitting: Internal Medicine

## 2023-09-05 ENCOUNTER — Encounter: Payer: Self-pay | Admitting: Internal Medicine

## 2023-09-12 ENCOUNTER — Ambulatory Visit (HOSPITAL_BASED_OUTPATIENT_CLINIC_OR_DEPARTMENT_OTHER): Payer: No Typology Code available for payment source | Attending: Physical Therapy | Admitting: Physical Therapy

## 2023-09-12 NOTE — Therapy (Deleted)
OUTPATIENT PHYSICAL THERAPY LOWER EXTREMITY EVALUATION   Patient Name: Alisha Martinez MRN: [id number] DOB:[birth year], 67 y.o., female Today's Date: 09/12/2023  END OF SESSION:   Past Medical History:  Diagnosis Date   Arthritis    Asthma    Cancer (HCC)    "cancerous cells found on pap smear- removed surgically"   Diabetes mellitus without complication (HCC)    Fibromyalgia    Hypertension    MS (multiple sclerosis) (HCC)    Sleep apnea    Past Surgical History:  Procedure Laterality Date   APPENDECTOMY     EYE SURGERY Bilateral 2023   "repaired torn retina"   KNEE SURGERY     TOTAL HIP ARTHROPLASTY Right 12/28/2022   Procedure: RIGHT TOTAL HIP ARTHROPLASTY ANTERIOR APPROACH;  Surgeon: Kathryne Hitch, MD;  Location: [facility] OR;  Service: Orthopedics;  Laterality: Right;   VAGINA SURGERY     "pt states the doctor surgically removed cancerous cells from vaginal area"   Patient Active Problem List   Diagnosis Date Noted   Diabetes mellitus without complication (HCC) 12/31/2022   Hypertension 12/31/2022   Asthma 12/31/2022   Sinus tachycardia 12/31/2022   Dyslipidemia 12/31/2022   OSA (obstructive sleep apnea) 12/31/2022   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   Other fatigue 11/22/2022   Multiple sclerosis (HCC) 10/26/2021   Pain in both lower extremities 10/26/2021   Urinary dysfunction 10/26/2021   Numbness 10/26/2021    PCP: none  REFERRING PROVIDER: Doneen Poisson MD  REFERRING DIAG: Pain of right hip   THERAPY DIAG:  No diagnosis found.  Rationale for Evaluation and Treatment: Rehabilitation  ONSET DATE: ***  SUBJECTIVE:   SUBJECTIVE STATEMENT: ***  PERTINENT HISTORY: As per Dr Magnus Ivan 08/10/23 2/24 THR with onset of right lumbar pain physical therapy for core strengthening and back exercises with any modalities to help calm down her right-sided sciatica.  PAIN:  Are you having pain? {OPRCPAIN:27236}  PRECAUTIONS:  {Therapy precautions:24002}  RED FLAGS: {PT Red Flags:29287}   WEIGHT BEARING RESTRICTIONS: {Yes ***/No:24003}  FALLS:  Has patient fallen in last 6 months? {fallsyesno:27318}  LIVING ENVIRONMENT: Lives with: {OPRC lives with:25569::"lives with their family"} Lives in: {Lives in:25570} Stairs: {opstairs:27293} Has following equipment at home: {Assistive devices:23999}  OCCUPATION: ***  PLOF: {PLOF:24004}  PATIENT GOALS: ***  NEXT MD VISIT: ***  OBJECTIVE:   DIAGNOSTIC FINDINGS: As per Dr Magnus Ivan: AP pelvis and lateral of the right hip shows a well-seated total hip arthroplasty with no complicating features. He does on these films showed the lower aspect of her lumbar spine which shows significant degeneration and curvature.   PATIENT SURVEYS:  FOTO ***  COGNITION: Overall cognitive status: {cognition:24006}     SENSATION: {sensation:27233}  EDEMA:  {edema:24020}  MUSCLE LENGTH: Hamstrings: Right *** deg; Left *** deg Maisie Fus test: Right *** deg; Left *** deg  POSTURE: {posture:25561}  PALPATION: ***  LOWER EXTREMITY ROM:  {AROM/PROM:27142} ROM Right eval Left eval  Hip flexion    Hip extension    Hip abduction    Hip adduction    Hip internal rotation    Hip external rotation    Knee flexion    Knee extension    Ankle dorsiflexion    Ankle plantarflexion    Ankle inversion    Ankle eversion     (Blank rows = not tested)  LOWER EXTREMITY MMT:  MMT Right eval Left eval  Hip flexion    Hip extension    Hip abduction  Hip adduction    Hip internal rotation    Hip external rotation    Knee flexion    Knee extension    Ankle dorsiflexion    Ankle plantarflexion    Ankle inversion    Ankle eversion     (Blank rows = not tested)  LOWER EXTREMITY SPECIAL TESTS:  {LEspecialtests:26242}  FUNCTIONAL TESTS:  {Functional tests:24029}  GAIT: Distance walked: *** Assistive device utilized: {Assistive devices:23999} Level of  assistance: {Levels of assistance:24026} Comments: ***   TODAY'S TREATMENT:                                                                                                                              DATE: ***    PATIENT EDUCATION:  Education details: *** Person educated: {Person educated:25204} Education method: {Education Method:25205} Education comprehension: {Education Comprehension:25206}  HOME EXERCISE PROGRAM: ***  ASSESSMENT:  CLINICAL IMPRESSION: Patient is a *** y.o. *** who was seen today for physical therapy evaluation and treatment for ***.   OBJECTIVE IMPAIRMENTS: {opptimpairments:25111}.   ACTIVITY LIMITATIONS: {activitylimitations:27494}  PARTICIPATION LIMITATIONS: {participationrestrictions:25113}  PERSONAL FACTORS: {Personal factors:25162} are also affecting patient's functional outcome.   REHAB POTENTIAL: {rehabpotential:25112}  CLINICAL DECISION MAKING: {clinical decision making:25114}  EVALUATION COMPLEXITY: {Evaluation complexity:25115}   GOALS: Goals reviewed with patient? {yes/no:20286}  SHORT TERM GOALS: Target date: *** *** Baseline: Goal status: INITIAL  2.  *** Baseline:  Goal status: INITIAL  3.  *** Baseline:  Goal status: INITIAL  4.  *** Baseline:  Goal status: INITIAL  5.  *** Baseline:  Goal status: INITIAL  6.  *** Baseline:  Goal status: INITIAL  LONG TERM GOALS: Target date: ***  *** Baseline:  Goal status: INITIAL  2.  *** Baseline:  Goal status: INITIAL  3.  *** Baseline:  Goal status: INITIAL  4.  *** Baseline:  Goal status: INITIAL  5.  *** Baseline:  Goal status: INITIAL  6.  *** Baseline:  Goal status: INITIAL   PLAN:  PT FREQUENCY: {rehab frequency:25116}  PT DURATION: {rehab duration:25117}  PLANNED INTERVENTIONS: {rehab planned interventions:25118::"Therapeutic exercises","Therapeutic activity","Neuromuscular re-education","Balance training","Gait training","Patient/Family  education","Self Care","Joint mobilization"}  PLAN FOR NEXT SESSION: Geni Bers, PT 09/12/2023, 8:18 AM

## 2023-09-21 ENCOUNTER — Other Ambulatory Visit (INDEPENDENT_AMBULATORY_CARE_PROVIDER_SITE_OTHER): Payer: Self-pay

## 2023-09-21 ENCOUNTER — Encounter: Payer: Self-pay | Admitting: Orthopaedic Surgery

## 2023-09-21 ENCOUNTER — Ambulatory Visit (INDEPENDENT_AMBULATORY_CARE_PROVIDER_SITE_OTHER): Payer: No Typology Code available for payment source | Admitting: Orthopaedic Surgery

## 2023-09-21 DIAGNOSIS — M5441 Lumbago with sciatica, right side: Secondary | ICD-10-CM

## 2023-09-21 DIAGNOSIS — G8929 Other chronic pain: Secondary | ICD-10-CM

## 2023-09-21 NOTE — Progress Notes (Signed)
The patient is well-known to Alisha Martinez.  She is getting close to 9 months status post a right total hip arthroplasty.  She is of the right hip is done very well.  She does ambulate slowly using a cane.  She is 67 years old and does have MS.  She does get physical therapy occasionally and this does help with some chronic low back pain.  She does wear a back support brace at times.  She denies any radicular symptoms today.  She said last several days have been much better for her.  She has not had any type of interventions for her spine.  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  On exam she has limited flexion and extension of her lumbar spine with pain at the posterior elements of the lumbar spine.  She has a negative straight leg raise today's bilaterally.  Her right operative hip moves smoothly.  She does have decent strength in the bilateral extremities which are equal.  2 views lumbar spine shows significant degenerative changes at multiple levels including loss of lumbar lordosis.  At this point since she is at her baseline, follow-up can be as needed but if her back worsens anyway she should let Alisha Martinez know.  All question concerns were answered addressed.  Follow-up for her right hip replacement also is as needed at this point.

## 2023-11-08 ENCOUNTER — Ambulatory Visit (HOSPITAL_BASED_OUTPATIENT_CLINIC_OR_DEPARTMENT_OTHER): Payer: Medicare PPO | Attending: Physical Therapy | Admitting: Physical Therapy

## 2023-11-08 ENCOUNTER — Encounter (HOSPITAL_BASED_OUTPATIENT_CLINIC_OR_DEPARTMENT_OTHER): Payer: Self-pay | Admitting: Physical Therapy

## 2023-11-08 ENCOUNTER — Other Ambulatory Visit: Payer: Self-pay

## 2023-11-08 DIAGNOSIS — M5459 Other low back pain: Secondary | ICD-10-CM | POA: Diagnosis present

## 2023-11-08 DIAGNOSIS — R2689 Other abnormalities of gait and mobility: Secondary | ICD-10-CM | POA: Diagnosis present

## 2023-11-08 DIAGNOSIS — M6281 Muscle weakness (generalized): Secondary | ICD-10-CM | POA: Diagnosis present

## 2023-11-08 DIAGNOSIS — M25551 Pain in right hip: Secondary | ICD-10-CM | POA: Insufficient documentation

## 2023-11-08 NOTE — Therapy (Signed)
OUTPATIENT PHYSICAL THERAPY LOWER EXTREMITY EVALUATION   Patient Name: Alisha Martinez MRN: [id number] DOB:1956/12/04, 67 y.o., female Today's Date: 11/08/2023  END OF SESSION:  PT End of Session - 11/08/23 1232     Visit Number 1    Number of Visits 10    Authorization Type [employer] mcr    PT Start Time 1117    PT Stop Time 1200    PT Time Calculation (min) 43 min    Activity Tolerance Patient tolerated treatment well    Behavior During Therapy WFL for tasks assessed/performed             Past Medical History:  Diagnosis Date   Arthritis    Asthma    Cancer (HCC)    "cancerous cells found on pap smear- removed surgically"   Diabetes mellitus without complication (HCC)    Fibromyalgia    Hypertension    MS (multiple sclerosis) (HCC)    Sleep apnea    Past Surgical History:  Procedure Laterality Date   APPENDECTOMY     EYE SURGERY Bilateral 2023   "repaired torn retina"   KNEE SURGERY     TOTAL HIP ARTHROPLASTY Right 12/28/2022   Procedure: RIGHT TOTAL HIP ARTHROPLASTY ANTERIOR APPROACH;  Surgeon: Kathryne Hitch, MD;  Location: [facility] OR;  Service: Orthopedics;  Laterality: Right;   VAGINA SURGERY     "pt states the doctor surgically removed cancerous cells from vaginal area"   Patient Active Problem List   Diagnosis Date Noted   Diabetes mellitus without complication (HCC) 12/31/2022   Hypertension 12/31/2022   Asthma 12/31/2022   Sinus tachycardia 12/31/2022   Dyslipidemia 12/31/2022   OSA (obstructive sleep apnea) 12/31/2022   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   Other fatigue 11/22/2022   Multiple sclerosis (HCC) 10/26/2021   Pain in both lower extremities 10/26/2021   Urinary dysfunction 10/26/2021   Numbness 10/26/2021    PCP: none  REFERRING PROVIDER: Dr Deboraha Sprang  REFERRING DIAG: R hip pain  THERAPY DIAG:  Other low back pain  Other abnormalities of gait and mobility  Muscle weakness  (generalized)  Rationale for Evaluation and Treatment: Rehabilitation  ONSET DATE: exacerbation last few months  SUBJECTIVE:   SUBJECTIVE STATEMENT: Have been going to other aquatic therapy since wait time to get in here.  Just finished.  I like to line dance.  Was able to do a little about 3 weeks ago. My back has been hurting me since my hip surgery.  Have a had time walking  PERTINENT HISTORY: PMH includes Multiple sclerosis, cancer, DM, HTN. Status post total replacement of right hip    PAIN:  Are you having pain? Yes: NPRS scale: current  sitting 3/10 worst 8/10 Pain location: across lumbar spine into gluts Pain description: ache Aggravating factors: walking; standing Relieving factors: sitting; ibuprofen; massage  PRECAUTIONS: None  RED FLAGS: None   WEIGHT BEARING RESTRICTIONS: No  FALLS:  Has patient fallen in last 6 months? No  LIVING ENVIRONMENT: Lives with: lives with their daughter Lives in: House/apartment Stairs: Yes: External: 17 steps; on right going up Has following equipment at home: Single point cane  OCCUPATION: retired/disability  PLOF: Independent, line dancing, shopping, usually drives.   PATIENT GOALS: walk with improved posture  NEXT MD VISIT: as needed  OBJECTIVE:  Note: Objective measures were completed at Evaluation unless otherwise noted.  DIAGNOSTIC FINDINGS: 09/21/23 Lumbar x-ray 2 views of the lumbar spine shows severe degenerative changes at multiple  levels.  The lateral view shows complete loss of lumbar lordosis.   PATIENT SURVEYS:  FOTO Primary score of 45% with goal of 54%  COGNITION: Overall cognitive status: Within functional limits for tasks assessed     SENSATION: Burning in feet due to diabetes    POSTURE: decreased lumbar lordosis  PALPATION: Slight TTP throughout lumbar   LUMBAR  Flex full Side bending limited R/L by 50%   LOWER EXTREMITY ROM:   LOWER EXTREMITY MMT:  MMT Right eval Left eval   Hip flexion 33.4 45.7  Hip extension    Hip abduction 38.5 39.7  Hip adduction    Hip internal rotation    Hip external rotation    Knee flexion    Knee extension    Ankle dorsiflexion    Ankle plantarflexion    Ankle inversion    Ankle eversion     (Blank rows = not tested)  Lumbar Test Slump neg  FUNCTIONAL TESTS:  5 times sit to stand: 11.47 Berg Balance Scale: 38/56  TUG: 16.56 without AD GAIT: Distance walked: 20 ft Assistive device utilized:  none with her today arrives in [facility].  Needs walker/cane Level of assistance: Modified independence Comments: offloading right in stance.  Bilateral gross lateral displacement ambulating without ad (did not have with her at eval).  Slight hip circumduction bilaterally to clear feet from floor   TODAY'S TREATMENT:                                                                                                                              Eval Testing Pt edu/given list of pools in area    PATIENT EDUCATION:  Education details: Discussed eval findings, rehab rationale, aquatic program progression/POC and pools in area. Patient is in agreement  Person educated: Patient Education method: Explanation Education comprehension: verbalized understanding  HOME EXERCISE PROGRAM: [id number] assigned last episode  ASSESSMENT:  CLINICAL IMPRESSION: Patient is a 67 y.o. f who was seen today for physical therapy evaluation and treatment for s/p right THR with LBP.  Pt is well known to this clinic as she has just finished up an episode of PT in June both land and aquatic post right THR.  She was also going to [facility] waiting for her appointment here at [facility] in the interim at their pool.  She has an additional dx of chronic LBP which is her main complaint today. Pt reports compliance with land based exercises as instructed previously. She presents using wc to setting as she did not bring her AD.  Testing demonstrates continued  residual weakness in right hip from recent replacement along with evident core strength deficits.  Her pain level is low unless amb.  Increases significantly amb without AD which she does need for safety.  She will benefit from skilled aquatic PT to instruct pt on techniques to improve le and core strength, improve balance, gait train submerged with edu on importance of using AD  land based for safety and she also is a high fall risk as per Sharlene Motts.  She is given list of pools in area and VU of long term management of chronic condition using pool as a tool would require pool access. She will decide if able going forward. Plan to see her pool only.  Will dc with aquatic HEP  OBJECTIVE IMPAIRMENTS: Abnormal gait, decreased balance, decreased knowledge of use of DME, decreased mobility, difficulty walking, decreased strength, obesity, and pain.   ACTIVITY LIMITATIONS: carrying, lifting, standing, squatting, and locomotion level  PARTICIPATION LIMITATIONS: shopping and community activity  PERSONAL FACTORS:  PMH includes Multiple sclerosis, cancer, DM, HTN.  are also affecting patient's functional outcome.   REHAB POTENTIAL: Good  CLINICAL DECISION MAKING: Evolving/moderate complexity  EVALUATION COMPLEXITY: Moderate   GOALS: Goals reviewed with patient? No  SHORT TERM GOALS: Target date: 12/30/23 Pt will tolerate full aquatic sessions consistently without increase in pain and with improving function to demonstrate good toleration and effectiveness of intervention.  Baseline: Goal status: INITIAL  2.  Pt will be indep with aquatic final HEP for continued management of condition. Baseline:  Goal status: INITIAL  3.  Pt to amb with approp AD for safety to and from pool and tolerate entire aquatic session without increase in pain or excessive fatigue Baseline:  Goal status: INITIAL  4.  Pt will improve on Berg balance test to >/= 44/56 to demonstrate a decrease in fall risk Baseline: 38/56 Goal  status: INITIAL  5.  Pt will improve right hip flex strength by 5 LBs to demonstrate improved overall physical function Baseline: see chart Goal status: INITIAL  6.  Pt will demonstrate an improved posture with amb using ad. Baseline:  Goal status: INITIAL  LONG TERM GOALS: Target date: to be set as approp at re-cert     PLAN:  PT FREQUENCY: 1-2x/week  PT DURATION: other: 7 weeks  8- 10 visits  PLANNED INTERVENTIONS: 97164- PT Re-evaluation, 97110-Therapeutic exercises, 97530- Therapeutic activity, 97112- Neuromuscular re-education, 97535- Self Care, 16109- Manual therapy, [phone]- Gait training, [phone]- Aquatic Therapy, Balance training, Stair training, Taping, Dry Needling, DME instructions, Cryotherapy, and Moist heat  PLAN FOR NEXT SESSION: Aquatics for core and hip strengthening, gait training, use of approp AD with amb land based; balance retraining, management of chronic condition, aquatic HEP   Geni Bers, PT 11/08/2023, 12:38 PM   Referring diagnosis? R hip pain Treatment diagnosis? (if different than referring diagnosis) LBP What was this (referring dx) caused by? []  Surgery []  Fall [x]  Ongoing issue []  Arthritis []  Other: ____________  Laterality: []  Rt []  Lt [x]  Both  Check all possible CPT codes:  *CHOOSE 10 OR LESS*    See Planned Interventions listed in the Plan section of the Evaluation.

## 2023-11-10 ENCOUNTER — Ambulatory Visit (HOSPITAL_BASED_OUTPATIENT_CLINIC_OR_DEPARTMENT_OTHER): Payer: Medicare PPO | Admitting: Physical Therapy

## 2023-11-10 ENCOUNTER — Encounter (HOSPITAL_BASED_OUTPATIENT_CLINIC_OR_DEPARTMENT_OTHER): Payer: Self-pay | Admitting: Physical Therapy

## 2023-11-10 DIAGNOSIS — M6281 Muscle weakness (generalized): Secondary | ICD-10-CM

## 2023-11-10 DIAGNOSIS — M5459 Other low back pain: Secondary | ICD-10-CM

## 2023-11-10 DIAGNOSIS — R2689 Other abnormalities of gait and mobility: Secondary | ICD-10-CM

## 2023-11-10 NOTE — Therapy (Signed)
OUTPATIENT PHYSICAL THERAPY LOWER EXTREMITY EVALUATION   Patient Name: Alisha Martinez MRN: [id number] DOB:November 08, 1956, 67 y.o., female Today's Date: 11/10/2023  END OF SESSION:  PT End of Session - 11/10/23 1028     Visit Number 2    Number of Visits 10    Date for PT Re-Evaluation 12/30/23    Authorization Type [employer] m[employer]    PT Start Time 1030    PT Stop Time 1115    PT Time Calculation (min) 45 min    Activity Tolerance Patient tolerated treatment well    Behavior During Therapy WFL for tasks assessed/performed              Past Medical History:  Diagnosis Date   Arthritis    Asthma    Cancer (HCC)    "cancerous cells found on pap smear- removed surgically"   Diabetes mellitus without complication (HCC)    Fibromyalgia    Hypertension    MS (multiple sclerosis) (HCC)    Sleep apnea    Past Surgical History:  Procedure Laterality Date   APPENDECTOMY     EYE SURGERY Bilateral 2023   "repaired torn retina"   KNEE SURGERY     TOTAL HIP ARTHROPLASTY Right 12/28/2022   Procedure: RIGHT TOTAL HIP ARTHROPLASTY ANTERIOR APPROACH;  Surgeon: Kathryne Hitch, MD;  Location: [facility] OR;  Service: Orthopedics;  Laterality: Right;   VAGINA SURGERY     "pt states the doctor surgically removed cancerous cells from vaginal area"   Patient Active Problem List   Diagnosis Date Noted   Diabetes mellitus without complication (HCC) 12/31/2022   Hypertension 12/31/2022   Asthma 12/31/2022   Sinus tachycardia 12/31/2022   Dyslipidemia 12/31/2022   OSA (obstructive sleep apnea) 12/31/2022   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   Other fatigue 11/22/2022   Multiple sclerosis (HCC) 10/26/2021   Pain in both lower extremities 10/26/2021   Urinary dysfunction 10/26/2021   Numbness 10/26/2021    PCP: none  REFERRING PROVIDER: Dr Deboraha Sprang  REFERRING DIAG: R hip pain  THERAPY DIAG:  Other low back pain  Other abnormalities of gait and  mobility  Muscle weakness (generalized)  Rationale for Evaluation and Treatment: Rehabilitation  ONSET DATE: exacerbation last few months  SUBJECTIVE:   SUBJECTIVE STATEMENT: Doing ok. Pain 3/10.  Rainy weather makes make joints more sore sometimes.   Initial Subjective Have been going to other aquatic therapy since wait time to get in here.  Just finished.  I like to line dance.  Was able to do a little about 3 weeks ago. My back has been hurting me since my hip surgery.  Have a had time walking  PERTINENT HISTORY: PMH includes Multiple sclerosis, cancer, DM, HTN. Status post total replacement of right hip    PAIN:  Are you having pain? Yes: NPRS scale: current  sitting 3/10 worst 8/10 Pain location: across lumbar spine into gluts Pain description: ache Aggravating factors: walking; standing Relieving factors: sitting; ibuprofen; massage  PRECAUTIONS: None  RED FLAGS: None   WEIGHT BEARING RESTRICTIONS: No    FALLS:  Has patient fallen in last 6 months? No  LIVING ENVIRONMENT: Lives with: lives with their daughter Lives in: House/apartment Stairs: Yes: External: 17 steps; on right going up Has following equipment at home: Single point cane  OCCUPATION: retired/disability  PLOF: Independent, line dancing, shopping, usually drives.   PATIENT GOALS: walk with improved posture  NEXT MD VISIT: as needed  OBJECTIVE:  Note:  Objective measures were completed at Evaluation unless otherwise noted.  DIAGNOSTIC FINDINGS: 09/21/23 Lumbar x-ray 2 views of the lumbar spine shows severe degenerative changes at multiple  levels.  The lateral view shows complete loss of lumbar lordosis.   PATIENT SURVEYS:  FOTO Primary score of 45% with goal of 54%  COGNITION: Overall cognitive status: Within functional limits for tasks assessed     SENSATION: Burning in feet due to diabetes    POSTURE: decreased lumbar lordosis  PALPATION: Slight TTP throughout lumbar    LUMBAR  Flex full Side bending limited R/L by 50%   LOWER EXTREMITY ROM:   LOWER EXTREMITY MMT:  MMT Right eval Left eval  Hip flexion 33.4 45.7  Hip extension    Hip abduction 38.5 39.7  Hip adduction    Hip internal rotation    Hip external rotation    Knee flexion    Knee extension    Ankle dorsiflexion    Ankle plantarflexion    Ankle inversion    Ankle eversion     (Blank rows = not tested)  Lumbar Test Slump neg  FUNCTIONAL TESTS:  5 times sit to stand: 11.47 Berg Balance Scale: 38/56  TUG: 16.56 without AD GAIT: Distance walked: 20 ft Assistive device utilized:  none with her today arrives in [facility].  Needs walker/cane Level of assistance: Modified independence Comments: offloading right in stance.  Bilateral gross lateral displacement ambulating without ad (did not have with her at eval).  Slight hip circumduction bilaterally to clear feet from floor   TODAY'S TREATMENT:                                                                                                                              Pt seen for aquatic therapy today.  Treatment took place in water 3.5-4.75 ft in depth at the [facility] pool. Temp of water was 91.  Pt entered/exited the pool via stairs using step to pattern with hand rail.  *walking forward, back and side stepping ue support barbell ->unsupported with cues for arm swing *side stepping with ue add/abd rainbow HB x 2 -> side lunges x 2 widths *L stretch *standing ue support barbell: df, pf; high knee marching; hip add/abd x 10 reps.  Cues for slowed pacing to encourage balance challenge *L stretch with tail wagging *seated on lift: cycling; LAQ; hip add/abd; flutter kicking 10 slow/10 fast x 2 sets *return to walking forward, back and side stepping for warm down.  Pt requires the buoyancy and hydrostatic pressure of water for support, and to offload joints by unweighting joint load by at least 50 % in navel deep water and  by at least 75-80% in chest to neck deep water.  Viscosity of the water is needed for resistance of strengthening. Water current perturbations provides challenge to standing balance requiring increased core activation.      PATIENT EDUCATION:  Education details: Discussed eval findings, rehab rationale, aquatic  program progression/POC and pools in area. Patient is in agreement  Person educated: Patient Education method: Explanation Education comprehension: verbalized understanding  HOME EXERCISE PROGRAM: [id number] assigned last episode  ASSESSMENT:  CLINICAL IMPRESSION: Pt demonstrates safety and indep in setting with therapist instructing from deck.  Pt reports some LB tightness with exercises, relieved with L stretch. T directed through exercises focused on LE general ROM, strengthening and balance.  She has fairly good toleration although does need extra LB stretching due to complaints of area tightness.  She is a good candidate for aquatic therapy intervention and will benefit from the properties of water to progress toward land based goals.  Initial Impression Patient is a 67 y.o. f who was seen today for physical therapy evaluation and treatment for s/p right THR with LBP.  Pt is well known to this clinic as she has just finished up an episode of PT in June both land and aquatic post right THR.  She was also going to [facility] waiting for her appointment here at [facility] in the interim at their pool.  She has an additional dx of chronic LBP which is her main complaint today. Pt reports compliance with land based exercises as instructed previously. She presents using wc to setting as she did not bring her AD.  Testing demonstrates continued residual weakness in right hip from recent replacement along with evident core strength deficits.  Her pain level is low unless amb.  Increases significantly amb without AD which she does need for safety.  She will benefit from skilled aquatic PT  to instruct pt on techniques to improve le and core strength, improve balance, gait train submerged with edu on importance of using AD land based for safety and she also is a high fall risk as per Sharlene Motts.  She is given list of pools in area and VU of long term management of chronic condition using pool as a tool would require pool access. She will decide if able going forward. Plan to see her pool only.  Will dc with aquatic HEP  OBJECTIVE IMPAIRMENTS: Abnormal gait, decreased balance, decreased knowledge of use of DME, decreased mobility, difficulty walking, decreased strength, obesity, and pain.   ACTIVITY LIMITATIONS: carrying, lifting, standing, squatting, and locomotion level  PARTICIPATION LIMITATIONS: shopping and community activity  PERSONAL FACTORS:  PMH includes Multiple sclerosis, cancer, DM, HTN.  are also affecting patient's functional outcome.   REHAB POTENTIAL: Good  CLINICAL DECISION MAKING: Evolving/moderate complexity  EVALUATION COMPLEXITY: Moderate   GOALS: Goals reviewed with patient? No  SHORT TERM GOALS: Target date: 12/30/23 Pt will tolerate full aquatic sessions consistently without increase in pain and with improving function to demonstrate good toleration and effectiveness of intervention.  Baseline: Goal status: INITIAL  2.  Pt will be indep with aquatic final HEP for continued management of condition. Baseline:  Goal status: INITIAL  3.  Pt to amb with approp AD for safety to and from pool and tolerate entire aquatic session without increase in pain or excessive fatigue Baseline:  Goal status: INITIAL  4.  Pt will improve on Berg balance test to >/= 44/56 to demonstrate a decrease in fall risk Baseline: 38/56 Goal status: INITIAL  5.  Pt will improve right hip flex strength by 5 LBs to demonstrate improved overall physical function Baseline: see chart Goal status: INITIAL  6.  Pt will demonstrate an improved posture with amb using ad. Baseline:   Goal status: INITIAL  LONG TERM GOALS: Target date: to be  set as approp at re-cert     PLAN:  PT FREQUENCY: 1-2x/week  PT DURATION: other: 7 weeks  8- 10 visits  PLANNED INTERVENTIONS: 97164- PT Re-evaluation, 97110-Therapeutic exercises, 97530- Therapeutic activity, 97112- Neuromuscular re-education, 97535- Self Care, 16109- Manual therapy, [phone]- Gait training, [phone]- Aquatic Therapy, Balance training, Stair training, Taping, Dry Needling, DME instructions, Cryotherapy, and Moist heat  PLAN FOR NEXT SESSION: Aquatics for core and hip strengthening, gait training, use of approp AD with amb land based; balance retraining, management of chronic condition, aquatic HEP   Rushie Chestnut) Ivry Pigue MPT 11/10/23 11:13 AM [facility] [address] [place], [place], [address] Phone: [phone]   Fax:  [phone]   Referring diagnosis? R hip pain Treatment diagnosis? (if different than referring diagnosis) LBP What was this (referring dx) caused by? []  Surgery []  Fall [x]  Ongoing issue []  Arthritis []  Other: ____________  Laterality: []  Rt []  Lt [x]  Both  Check all possible CPT codes:  *CHOOSE 10 OR LESS*    See Planned Interventions listed in the Plan section of the Evaluation.

## 2023-11-17 ENCOUNTER — Ambulatory Visit (HOSPITAL_BASED_OUTPATIENT_CLINIC_OR_DEPARTMENT_OTHER): Payer: Medicare PPO | Admitting: Physical Therapy

## 2023-11-17 ENCOUNTER — Encounter (HOSPITAL_BASED_OUTPATIENT_CLINIC_OR_DEPARTMENT_OTHER): Payer: Self-pay | Admitting: Physical Therapy

## 2023-11-17 DIAGNOSIS — M6281 Muscle weakness (generalized): Secondary | ICD-10-CM

## 2023-11-17 DIAGNOSIS — R2689 Other abnormalities of gait and mobility: Secondary | ICD-10-CM

## 2023-11-17 DIAGNOSIS — M5459 Other low back pain: Secondary | ICD-10-CM | POA: Diagnosis not present

## 2023-11-17 NOTE — Therapy (Signed)
OUTPATIENT PHYSICAL THERAPY LOWER EXTREMITY EVALUATION   Patient Name: Alisha Martinez MRN: [id number] DOB:[birth year], 67 y.o., female Today's Date: 11/17/2023  END OF SESSION:  PT End of Session - 11/17/23 1403     Visit Number 3    Number of Visits 10    Date for PT Re-Evaluation 12/30/23    Authorization Type [employer] m[employer]    PT Start Time 1403    PT Stop Time 1445    PT Time Calculation (min) 42 min    Activity Tolerance Patient tolerated treatment well    Behavior During Therapy WFL for tasks assessed/performed              Past Medical History:  Diagnosis Date   Arthritis    Asthma    Cancer (HCC)    "cancerous cells found on pap smear- removed surgically"   Diabetes mellitus without complication (HCC)    Fibromyalgia    Hypertension    MS (multiple sclerosis) (HCC)    Sleep apnea    Past Surgical History:  Procedure Laterality Date   APPENDECTOMY     EYE SURGERY Bilateral 2023   "repaired torn retina"   KNEE SURGERY     TOTAL HIP ARTHROPLASTY Right 12/28/2022   Procedure: RIGHT TOTAL HIP ARTHROPLASTY ANTERIOR APPROACH;  Surgeon: Kathryne Hitch, MD;  Location: [facility] OR;  Service: Orthopedics;  Laterality: Right;   VAGINA SURGERY     "pt states the doctor surgically removed cancerous cells from vaginal area"   Patient Active Problem List   Diagnosis Date Noted   Diabetes mellitus without complication (HCC) 12/31/2022   Hypertension 12/31/2022   Asthma 12/31/2022   Sinus tachycardia 12/31/2022   Dyslipidemia 12/31/2022   OSA (obstructive sleep apnea) 12/31/2022   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   Other fatigue 11/22/2022   Multiple sclerosis (HCC) 10/26/2021   Pain in both lower extremities 10/26/2021   Urinary dysfunction 10/26/2021   Numbness 10/26/2021    PCP: none  REFERRING PROVIDER: Dr Deboraha Sprang  REFERRING DIAG: R hip pain  THERAPY DIAG:  Other low back pain  Other abnormalities of gait and  mobility  Muscle weakness (generalized)  Rationale for Evaluation and Treatment: Rehabilitation  ONSET DATE: exacerbation last few months  SUBJECTIVE:   SUBJECTIVE STATEMENT: Doing ok. I do all of my exercises every day.  The morning is the worst and that is when I do all of my exercises.   Initial Subjective Have been going to other aquatic therapy since wait time to get in here.  Just finished.  I like to line dance.  Was able to do a little about 3 weeks ago. My back has been hurting me since my hip surgery.  Have a had time walking  PERTINENT HISTORY: PMH includes Multiple sclerosis, cancer, DM, HTN. Status post total replacement of right hip    PAIN:  Are you having pain? Yes: NPRS scale: current 3/10 sitting 3/10 worst 8/10 Pain location: across lumbar spine into gluts Pain description: ache Aggravating factors: walking; standing Relieving factors: sitting; ibuprofen; massage  PRECAUTIONS: None  RED FLAGS: None   WEIGHT BEARING RESTRICTIONS: No    FALLS:  Has patient fallen in last 6 months? No  LIVING ENVIRONMENT: Lives with: lives with their daughter Lives in: House/apartment Stairs: Yes: External: 17 steps; on right going up Has following equipment at home: Single point cane  OCCUPATION: retired/disability  PLOF: Independent, line dancing, shopping, usually drives.   PATIENT GOALS: walk  with improved posture  NEXT MD VISIT: as needed  OBJECTIVE:  Note: Objective measures were completed at Evaluation unless otherwise noted.  DIAGNOSTIC FINDINGS: 09/21/23 Lumbar x-ray 2 views of the lumbar spine shows severe degenerative changes at multiple  levels.  The lateral view shows complete loss of lumbar lordosis.   PATIENT SURVEYS:  FOTO Primary score of 45% with goal of 54%  COGNITION: Overall cognitive status: Within functional limits for tasks assessed     SENSATION: Burning in feet due to diabetes    POSTURE: decreased lumbar  lordosis  PALPATION: Slight TTP throughout lumbar   LUMBAR  Flex full Side bending limited R/L by 50%   LOWER EXTREMITY ROM:   LOWER EXTREMITY MMT:  MMT Right eval Left eval  Hip flexion 33.4 45.7  Hip extension    Hip abduction 38.5 39.7  Hip adduction    Hip internal rotation    Hip external rotation    Knee flexion    Knee extension    Ankle dorsiflexion    Ankle plantarflexion    Ankle inversion    Ankle eversion     (Blank rows = not tested)  Lumbar Test Slump neg  FUNCTIONAL TESTS:  5 times sit to stand: 11.47 Berg Balance Scale: 38/56  TUG: 16.56 without AD GAIT: Distance walked: 20 ft Assistive device utilized:  none with her today arrives in [facility].  Needs walker/cane Level of assistance: Modified independence Comments: offloading right in stance.  Bilateral gross lateral displacement ambulating without ad (did not have with her at eval).  Slight hip circumduction bilaterally to clear feet from floor   TODAY'S TREATMENT:                                                                                                                              Pt seen for aquatic therapy today.  Treatment took place in water 3.5-4.75 ft in depth at the [facility] pool. Temp of water was 91.  Pt entered/exited the pool via stairs using step to pattern with hand rail.  *walking forward, back and side stepping ue support barbell ->unsupported with cues for arm swing  - grapevine *side stepping with ue add/abd rainbow HB x 2  *rainbow HB carry forward and backward (good challenge for balance and core control) x 2 widths each *Squatted recovery *decompression with noodle wrapped posteriorly across chest. (Difficulty gaining COB)  - ue support corner wall: hip add/abd; hip flex/ext; cycling *standing ue support barbell: df, pf; high knee marching; hip add/abd x 10 reps.  Cues for slowed pacing to encourage balance challenge *L stretch     Pt requires the  buoyancy and hydrostatic pressure of water for support, and to offload joints by unweighting joint load by at least 50 % in navel deep water and by at least 75-80% in chest to neck deep water.  Viscosity of the water is needed for resistance of strengthening. Water current perturbations provides challenge  to standing balance requiring increased core activation.      PATIENT EDUCATION:  Education details: Discussed eval findings, rehab rationale, aquatic program progression/POC and pools in area. Patient is in agreement  Person educated: Patient Education method: Explanation Education comprehension: verbalized understanding  HOME EXERCISE PROGRAM: [id number] assigned last episode  ASSESSMENT:  CLINICAL IMPRESSION: Progressed balance and core engagement introducing vertical suspended balance. Gained very good unloaded LE movement for ROM and pain relief.  She has good toleration.  Reduction of pain by 2 NPRS. Goals ongoing   Initial Impression Patient is a 67 y.o. f who was seen today for physical therapy evaluation and treatment for s/p right THR with LBP.  Pt is well known to this clinic as she has just finished up an episode of PT in June both land and aquatic post right THR.  She was also going to [facility] waiting for her appointment here at [facility] in the interim at their pool.  She has an additional dx of chronic LBP which is her main complaint today. Pt reports compliance with land based exercises as instructed previously. She presents using wc to setting as she did not bring her AD.  Testing demonstrates continued residual weakness in right hip from recent replacement along with evident core strength deficits.  Her pain level is low unless amb.  Increases significantly amb without AD which she does need for safety.  She will benefit from skilled aquatic PT to instruct pt on techniques to improve le and core strength, improve balance, gait train submerged with edu on importance of  using AD land based for safety and she also is a high fall risk as per Sharlene Motts.  She is given list of pools in area and VU of long term management of chronic condition using pool as a tool would require pool access. She will decide if able going forward. Plan to see her pool only.  Will dc with aquatic HEP  OBJECTIVE IMPAIRMENTS: Abnormal gait, decreased balance, decreased knowledge of use of DME, decreased mobility, difficulty walking, decreased strength, obesity, and pain.   ACTIVITY LIMITATIONS: carrying, lifting, standing, squatting, and locomotion level  PARTICIPATION LIMITATIONS: shopping and community activity  PERSONAL FACTORS:  PMH includes Multiple sclerosis, cancer, DM, HTN.  are also affecting patient's functional outcome.   REHAB POTENTIAL: Good  CLINICAL DECISION MAKING: Evolving/moderate complexity  EVALUATION COMPLEXITY: Moderate   GOALS: Goals reviewed with patient? No  SHORT TERM GOALS: Target date: 12/30/23 Pt will tolerate full aquatic sessions consistently without increase in pain and with improving function to demonstrate good toleration and effectiveness of intervention.  Baseline: Goal status: INITIAL  2.  Pt will be indep with aquatic final HEP for continued management of condition. Baseline:  Goal status: INITIAL  3.  Pt to amb with approp AD for safety to and from pool and tolerate entire aquatic session without increase in pain or excessive fatigue Baseline:  Goal status: INITIAL  4.  Pt will improve on Berg balance test to >/= 44/56 to demonstrate a decrease in fall risk Baseline: 38/56 Goal status: INITIAL  5.  Pt will improve right hip flex strength by 5 LBs to demonstrate improved overall physical function Baseline: see chart Goal status: INITIAL  6.  Pt will demonstrate an improved posture with amb using ad. Baseline:  Goal status: INITIAL  LONG TERM GOALS: Target date: to be set as approp at re-cert     PLAN:  PT FREQUENCY:  1-2x/week  PT DURATION: other: 7 weeks  8- 10 visits  PLANNED INTERVENTIONS: 97164- PT Re-evaluation, 97110-Therapeutic exercises, 97530- Therapeutic activity, 97112- Neuromuscular re-education, 97535- Self Care, 24401- Manual therapy, [phone]- Gait training, [phone]- Aquatic Therapy, Balance training, Stair training, Taping, Dry Needling, DME instructions, Cryotherapy, and Moist heat  PLAN FOR NEXT SESSION: Aquatics for core and hip strengthening, gait training, use of approp AD with amb land based; balance retraining, management of chronic condition, aquatic HEP   Rushie Chestnut) Dmarcus Decicco MPT 11/17/23 2:04 PM [facility] [address] [place], [place], [address] Phone: [phone]   Fax:  [phone]   Referring diagnosis? R hip pain Treatment diagnosis? (if different than referring diagnosis) LBP What was this (referring dx) caused by? []  Surgery []  Fall [x]  Ongoing issue []  Arthritis []  Other: ____________  Laterality: []  Rt []  Lt [x]  Both  Check all possible CPT codes:  *CHOOSE 10 OR LESS*    See Planned Interventions listed in the Plan section of the Evaluation.

## 2023-11-22 ENCOUNTER — Ambulatory Visit (HOSPITAL_BASED_OUTPATIENT_CLINIC_OR_DEPARTMENT_OTHER): Payer: Medicare PPO | Admitting: Physical Therapy

## 2023-11-22 ENCOUNTER — Encounter (HOSPITAL_BASED_OUTPATIENT_CLINIC_OR_DEPARTMENT_OTHER): Payer: Self-pay

## 2023-11-27 NOTE — Progress Notes (Unsigned)
  Cardiology Office Note:  .   Date:  11/27/2023  ID:  Herbie Saxon, DOB [birth year], MRN [id number] PCP: Patient, No Pcp Per  [facility] Providers Cardiologist: Maisie Fus, MD {   History of Present Illness: .   Krishma Janikowski is a 67 y.o. 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  During hospitalization patient had sinus tachycardia with left bundle morphology, and was seen by cardiology.  She was started on beta-blocker, and statin.  When last seen by Dr. Wyline Mood on 02/09/2023 a nuclear medicine stress test was ordered.  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  With defect in apical apex that was fixed.  Echocardiogram on 12/31/2022 revealed an EF of 40 to 45% with global hypokinesis.  She was continued on medical therapy.  ROS: ***  Studies Reviewed: .   Echocardiogram 12/31/2022 1. Akinesis of the anteroseptal/anterior/inferoseptal wall from base to  apex. Left ventricular ejection fraction, by estimation, is 40 to 45%. The  left ventricle has mildly decreased function. The left ventricle  demonstrates global hypokinesis. Left  ventricular diastolic parameters are indeterminate.   2. Right ventricular systolic function is normal. The right ventricular  size is normal. Tricuspid regurgitation signal is inadequate for assessing  PA pressure.   3. No evidence of mitral valve regurgitation.   4. The aortic valve is grossly normal. Aortic valve regurgitation is not  visualized.   5. The inferior vena cava is normal in size with greater than 50%  respiratory variability, suggesting right atrial pressure of 3 mmHg.    *** EKG Interpretation Date/Time:    Ventricular Rate:    PR Interval:    QRS Duration:    QT Interval:    QTC Calculation:   R Axis:      Text Interpretation:      Physical Exam:   VS:  There were no vitals taken for this visit.   Wt Readings from Last 3 Encounters:  05/24/23 208 lb (94.3 kg)  02/14/23  212 lb (96.2 kg)  02/09/23 212 lb 6.4 oz (96.3 kg)    GEN: Well nourished, well developed in no acute distress NECK: No JVD; No carotid bruits CARDIAC: ***RRR, no murmurs, rubs, gallops RESPIRATORY:  Clear to auscultation without rales, wheezing or rhonchi  ABDOMEN: Soft, non-tender, non-distended EXTREMITIES:  No edema; No deformity   ASSESSMENT AND PLAN: .   ***    {Are you ordering a CV Procedure (e.g. stress test, cath, DCCV, TEE, etc)?   Press F2        :[id number]}    Signed, Bettey Mare. Liborio Nixon, ANP, AACC

## 2023-11-28 ENCOUNTER — Ambulatory Visit: Payer: Medicare PPO | Attending: Adult Health | Admitting: Adult Health

## 2023-11-28 ENCOUNTER — Ambulatory Visit (HOSPITAL_BASED_OUTPATIENT_CLINIC_OR_DEPARTMENT_OTHER): Payer: Medicare PPO | Admitting: Physical Therapy

## 2023-11-29 ENCOUNTER — Ambulatory Visit: Payer: Medicare PPO | Admitting: Neurology

## 2023-11-29 ENCOUNTER — Encounter: Payer: Self-pay | Admitting: Neurology

## 2023-11-29 NOTE — Progress Notes (Unsigned)
Patient: Alisha Martinez Date of Birth: Jun 26, 1956  Reason for Visit: Follow up History from: Patient Primary Neurologist: Sater   ASSESSMENT AND PLAN 67 y.o. year old female   1.  Multiple sclerosis   HISTORY OF PRESENT ILLNESS: Today 11/29/23   UPDATE 05/24/2023 She is on Rebif and tolerates it well..     In 2023, while in [place], she had an episode of slurred speech and weakness x a couple hours and her daughter thought her behavior was erratic x 2 days. She went to the ED had MRI of the cervical spine and brain.  Cervical spine was read as being normal and there is no spinal stenosis.  The brain MRI showed no acute findings and stable T2 hyperintense foci consistent with MS   She has not had any more spells.   She had hip surgery January 2024.  Her HR elevated her third day and EKG showed sinus tach and left bundle morphology.  Echo showed an apical scar.   She was placed on ASA and a statin   She had notice that vision worsening. She had cataract surgery in one eye 5/15 and  vision is better,   She will have the right eye operation tomorrow   She feels gait is btter since her surgery.   She uses a cane mostly and sometimes a walker.     She has less muscle spasms and takes baclofen.   She denies arm weakness but legs sometimes feel weak.  She has dysesthesias.   She takes pregabalin 100 mg po tid.   She had oxycodone during her recovery from surgery and has since stopped. She denies much clumsiness.     She has urinary frequency and is on 15 mg oxybutynin and Myrbetriq.  She saw urology and a nerve stimulator was discussed.    .    She has fatigue.   She has OSA and no longer uses CPAP  She prefers not to use CPAP.  She did not feel any better with CPAP.  She wakes up frequently to use the bathroom.       She reports mild short term memory issues.    She denies depression now but had some in 2020 with Covid.      She has essential hypertension and type 2 NIDDM.       MRI  cervical spine 03/27/2022 showed a normal spinal cord and mild DJD   MRI brain 03/27/2022 showed no acute findings.   She had stable T2 hyperintense foci.     She has hip DJD and saw Ortho and will be getting hip surgery.    There has been delay as medical clearance took a while to get an appt.      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   She needed to rest much more between tasks.   Over the previous 2 years, she also had issues with short term memory and tingling in her limbs.   She had MRI's in 2004 that were concerning for MS.  She was diagnosed in [place] at the [facility].    She never had an LP.   She was started on Rebif and has tolerated it well.  She has had no major exacerbations.  However,  she reports a few exacerbations with leg and hand tingling or more pain in the legs.    She had her  most recent 'exacerbation'  08/31/2021 and went to the ED. at the time, she was reporting itching and tingling throughout her body that started on 08/30/2021.  In the emergency room, she was given prednisone and oxycodone and she felt better a few hours later with resolution of symptoms back to her baseline.  She had no focal deficits in the emergency room.  Liver function test showed mild elevated transaminases.  CBC and UA were fine.   Of note, a review of imaging reports performed at [facility] between 2016 and 2019 showed nonspecific changes in the brain described as being more consistent with chronic microvascular ischemic change.  Although the MRI of the cervical spine from 2022 reportedly found patchy areas of abnormal signal within the cord, more conspicuous than prior examinations, MRI from 2019 and 2017 showed a normal spinal cord. She was hospitalized with Covid-19 in July 2020 for 1 week.  She needed O2 but was not intubated.    She has a niece with MS.   Imaging  MRI cervical  spine 03/27/2022 showed a normal spinal cord and mild DJD      MRI brain 03/27/2022 showed no acute findings.   She had stable T2 hyperintense foci.    REVIEW OF SYSTEMS: Out of a complete 14 system review of symptoms, the patient complains only of the following symptoms, and all other reviewed systems are negative.  See HPI  ALLERGIES: Allergies  Allergen Reactions   Compazine [Prochlorperazine]     Muscle pain   Lisinopril Swelling    HOME MEDICATIONS: Outpatient Medications Prior to Visit  Medication Sig Dispense Refill   aspirin 81 MG chewable tablet Chew 1 tablet (81 mg total) by mouth 2 (two) times daily. 30 tablet 0   atorvastatin (LIPITOR) 80 MG tablet Take 80 mg by mouth daily.     baclofen (LIORESAL) 10 MG tablet Take 10 mg by mouth 3 (three) times daily.     chlorthalidone (HYGROTON) 25 MG tablet Take 25 mg by mouth daily.     diclofenac (VOLTAREN) 75 MG EC tablet Take 75 mg by mouth 2 (two) times daily. (Patient not taking: Reported on 05/24/2023)     ergocalciferol (VITAMIN D2) 1.25 MG (50000 UT) capsule Take 50,000 Units by mouth every Friday.     Interferon Beta-1a (REBIF REBIDOSE) 44 MCG/0.5ML SOAJ Inject 0.5 mLs into the skin 3 (three) times a week. 36 mL 3   JARDIANCE 25 MG TABS tablet Take 25 mg by mouth daily.     levocetirizine (XYZAL) 5 MG tablet Take 5 mg by mouth every evening.     metFORMIN (GLUCOPHAGE) 1000 MG tablet Take 1,000 mg by mouth 2 (two) times daily with a meal.     Metoprolol Tartrate 37.5 MG TABS Take 37.5 mg by mouth 2 (two) times daily.     mirabegron ER (MYRBETRIQ) 50 MG TB24 tablet Take 1 tablet (50 mg total) by mouth daily. 90 tablet 1   oxybutynin (DITROPAN XL) 15 MG 24 hr tablet Take 15 mg by mouth at bedtime.     potassium chloride SA (KLOR-CON M) 20 MEQ tablet Take 1 tablet (20 mEq total) by mouth 2 (two) times daily. 30 tablet 0   pregabalin (LYRICA) 100 MG capsule Take 100 mg by mouth 3 (three) times daily.     No facility-administered  medications prior to visit.    PAST MEDICAL HISTORY: Past Medical History:  Diagnosis Date   Arthritis    Asthma  Cancer (HCC)    "cancerous cells found on pap smear- removed surgically"   Diabetes mellitus without complication (HCC)    Fibromyalgia    Hypertension    MS (multiple sclerosis) (HCC)    Sleep apnea     PAST SURGICAL HISTORY: Past Surgical History:  Procedure Laterality Date   APPENDECTOMY     EYE SURGERY Bilateral 2023   "repaired torn retina"   KNEE SURGERY     TOTAL HIP ARTHROPLASTY Right 12/28/2022   Procedure: RIGHT TOTAL HIP ARTHROPLASTY ANTERIOR APPROACH;  Surgeon: Kathryne Hitch, MD;  Location: [facility] OR;  Service: Orthopedics;  Laterality: Right;   VAGINA SURGERY     "pt states the doctor surgically removed cancerous cells from vaginal area"    FAMILY HISTORY: Family History  Problem Relation Age of Onset   Alopecia Mother    Cancer Mother    Cancer Father    Diabetes Father    Cancer Sister     SOCIAL HISTORY: Social History   Socioeconomic History   Marital status: Divorced    Spouse name: Not on file   Number of children: 2   Years of education: Not on file   Highest education level: High school graduate  Occupational History   Not on file  Tobacco Use   Smoking status: Never   Smokeless tobacco: Never  Vaping Use   Vaping status: Never Used  Substance and Sexual Activity   Alcohol use: Not Currently   Drug use: Never   Sexual activity: Not on file  Other Topics Concern   Not on file  Social History Narrative   Lives w daughter   Right handed   Caffeine: occa drinks tea   Social Determinants of Health   Financial Resource Strain: Not on file  Food Insecurity: Unknown (01/16/2023)   Received from [employer] and [employer], [employer] and [employer] Insec[employer]    Worried about running out of food: Not on [employer] Bought: Not on file  Transportation Needs: Unknown  (01/16/2023)   Received from [employer] and [employer], [employer] and [employer]    Worried about transportation: Not on file  Physical Activity: Not on file  Stress: Not on file  Social Connections: Unknown (06/23/2023)   Received from [employer], [facility]   Social Network    Social Network: Not on file  Intimate Partner Violence: Unknown (06/23/2023)   Received from [facility], [facility]   HITS    Physically Hurt: Not on file    Insult or Talk Down To: Not on file    Threaten Physical Harm: Not on file    Scream or Curse: Not on file    PHYSICAL EXAM  There were no vitals filed for this visit. There is no height or weight on file to calculate BMI.  Generalized: Well developed, in no acute distress  Neurological examination  Mentation: Alert oriented to time, place, history taking. Follows all commands speech and language fluent Cranial nerve II-XII: Pupils were equal round reactive to light. Extraocular movements were full, visual field were full on confrontational test. Facial sensation and strength were normal. Uvula tongue midline. Head turning and shoulder shrug  were normal and symmetric. Motor: The motor testing reveals 5 over 5 strength of all 4 extremities. Good symmetric motor tone is noted throughout.  Sensory: Sensory testing is intact to soft touch on all 4 extremities. No evidence of extinction  is noted.  Coordination: Cerebellar testing reveals good finger-nose-finger and heel-to-shin bilaterally.  Gait and station: Gait is normal. Tandem gait is normal. Romberg is negative. No drift is seen.  Reflexes: Deep tendon reflexes are symmetric and normal bilaterally.   DIAGNOSTIC DATA (LABS, IMAGING, TESTING) - I reviewed patient records, labs, notes, testing and imaging myself where available.  Lab Results  Component Value Date   WBC 9.4 01/02/2023   HGB 12.5 01/02/2023   HCT 37.3 01/02/2023   MCV 87.8  01/02/2023   PLT 255 01/02/2023      Component Value Date/Time   NA 136 01/02/2023 0252   K 3.7 01/02/2023 0252   CL 99 01/02/2023 0252   CO2 26 01/02/2023 0252   GLUCOSE 132 (H) 01/02/2023 0252   BUN 22 01/02/2023 0252   CREATININE 0.87 01/02/2023 0252   CALCIUM 9.3 01/02/2023 0252   PROT 7.4 01/02/2023 0252   ALBUMIN 2.9 (L) 01/02/2023 0252   AST 33 01/02/2023 0252   ALT 27 01/02/2023 0252   ALKPHOS 56 01/02/2023 0252   BILITOT 0.4 01/02/2023 0252   GFRNONAA >60 01/02/2023 0252   No results found for: "CHOL", "HDL", "LDLCALC", "LDLDIRECT", "TRIG", "CHOLHDL" Lab Results  Component Value Date   HGBA1C 7.2 (H) 12/22/2022   No results found for: "VITAMINB12" No results found for: "TSH"  Margie Ege, AGNP-C, DNP 11/29/2023, 5:19 AM [facility] [address] [place], [place] [address] [phone]

## 2023-11-30 ENCOUNTER — Ambulatory Visit (HOSPITAL_BASED_OUTPATIENT_CLINIC_OR_DEPARTMENT_OTHER): Payer: Medicare PPO | Attending: Physical Therapy | Admitting: Physical Therapy

## 2023-11-30 ENCOUNTER — Encounter (HOSPITAL_BASED_OUTPATIENT_CLINIC_OR_DEPARTMENT_OTHER): Payer: Self-pay | Admitting: Physical Therapy

## 2023-11-30 DIAGNOSIS — M5459 Other low back pain: Secondary | ICD-10-CM | POA: Diagnosis present

## 2023-11-30 DIAGNOSIS — M6281 Muscle weakness (generalized): Secondary | ICD-10-CM | POA: Diagnosis present

## 2023-11-30 DIAGNOSIS — M25551 Pain in right hip: Secondary | ICD-10-CM | POA: Diagnosis present

## 2023-11-30 DIAGNOSIS — R2689 Other abnormalities of gait and mobility: Secondary | ICD-10-CM | POA: Insufficient documentation

## 2023-11-30 NOTE — Therapy (Signed)
OUTPATIENT PHYSICAL THERAPY LOWER EXTREMITY TREATMENT   Patient Name: Alisha Martinez MRN: [id number] DOB:1956/09/07, 67 y.o., female Today's Date: 11/30/2023  END OF SESSION:  PT End of Session - 11/30/23 1111     Visit Number 4    Number of Visits 10    Date for PT Re-Evaluation 12/30/23    Authorization Type [employer] MCR    PT Start Time 1110    PT Stop Time 1150    PT Time Calculation (min) 40 min    Activity Tolerance Patient tolerated treatment well    Behavior During Therapy WFL for tasks assessed/performed              Past Medical History:  Diagnosis Date   Arthritis    Asthma    Cancer (HCC)    "cancerous cells found on pap smear- removed surgically"   Diabetes mellitus without complication (HCC)    Fibromyalgia    Hypertension    MS (multiple sclerosis) (HCC)    Sleep apnea    Past Surgical History:  Procedure Laterality Date   APPENDECTOMY     EYE SURGERY Bilateral 2023   "repaired torn retina"   KNEE SURGERY     TOTAL HIP ARTHROPLASTY Right 12/28/2022   Procedure: RIGHT TOTAL HIP ARTHROPLASTY ANTERIOR APPROACH;  Surgeon: Kathryne Hitch, MD;  Location: [facility] OR;  Service: Orthopedics;  Laterality: Right;   VAGINA SURGERY     "pt states the doctor surgically removed cancerous cells from vaginal area"   Patient Active Problem List   Diagnosis Date Noted   Diabetes mellitus without complication (HCC) 12/31/2022   Hypertension 12/31/2022   Asthma 12/31/2022   Sinus tachycardia 12/31/2022   Dyslipidemia 12/31/2022   OSA (obstructive sleep apnea) 12/31/2022   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   Other fatigue 11/22/2022   Multiple sclerosis (HCC) 10/26/2021   Pain in both lower extremities 10/26/2021   Urinary dysfunction 10/26/2021   Numbness 10/26/2021    PCP: none  REFERRING PROVIDER: Dr Deboraha Sprang  REFERRING DIAG: R hip pain  THERAPY DIAG:  Other low back pain  Other abnormalities of gait and  mobility  Muscle weakness (generalized)  Rationale for Evaluation and Treatment: Rehabilitation  ONSET DATE: exacerbation last few months  SUBJECTIVE:   SUBJECTIVE STATEMENT: "The day after Thanksgiving for some reason my L knee started to give out."  Used "muscle cream" on knee, elevated, rested and it improved by next day.    POOL ACCESS:  Member of [employer], plans to go after d/c from PT.   PERTINENT HISTORY: PMH includes Multiple sclerosis, cancer, DM, HTN. Status post total replacement of right hip    PAIN:  Are you having pain? Yes: NPRS scale: current 3/10  Pain location: across lumbar spine into gluts Pain description: ache Aggravating factors: walking; standing Relieving factors: sitting; ibuprofen; massage  PRECAUTIONS: None  RED FLAGS: None   WEIGHT BEARING RESTRICTIONS: No    FALLS:  Has patient fallen in last 6 months? No  LIVING ENVIRONMENT: Lives with: lives with their daughter Lives in: House/apartment Stairs: Yes: External: 17 steps; on right going up Has following equipment at home: Single point cane  OCCUPATION: retired/disability  PLOF: Independent, line dancing, shopping, usually drives.   PATIENT GOALS: walk with improved posture  NEXT MD VISIT: as needed  OBJECTIVE:  Note: Objective measures were completed at Evaluation unless otherwise noted.  DIAGNOSTIC FINDINGS: 09/21/23 Lumbar x-ray 2 views of the lumbar spine shows severe degenerative changes  at multiple  levels.  The lateral view shows complete loss of lumbar lordosis.   PATIENT SURVEYS:  FOTO Primary score of 45% with goal of 54%  COGNITION: Overall cognitive status: Within functional limits for tasks assessed     SENSATION: Burning in feet due to diabetes    POSTURE: decreased lumbar lordosis  PALPATION: Slight TTP throughout lumbar   LUMBAR  Flex full Side bending limited R/L by 50%   LOWER EXTREMITY ROM:   LOWER EXTREMITY MMT:  MMT Right eval Left eval   Hip flexion 33.4 45.7  Hip extension    Hip abduction 38.5 39.7  Hip adduction    Hip internal rotation    Hip external rotation    Knee flexion    Knee extension    Ankle dorsiflexion    Ankle plantarflexion    Ankle inversion    Ankle eversion     (Blank rows = not tested)  Lumbar Test Slump neg  FUNCTIONAL TESTS:  5 times sit to stand: 11.47 Berg Balance Scale: 38/56  TUG: 16.56 without AD GAIT: Distance walked: 20 ft Assistive device utilized:  none with her today arrives in [facility].  Needs walker/cane Level of assistance: Modified independence Comments: offloading right in stance.  Bilateral gross lateral displacement ambulating without ad (did not have with her at eval).  Slight hip circumduction bilaterally to clear feet from floor   TODAY'S TREATMENT:                                                                                                                              Pt seen for aquatic therapy today.  Treatment took place in water 3.5-4.75 ft in depth at the [facility] pool. Temp of water was 91.  Pt entered/exited the pool via stairs using step to pattern with hand rail.  *UE on yellow hand floats, with alternating rowing motion:  walking forward, backward x 4 laps *  side stepping with arm addct/abdct with rainbow hand floats x 2 laps * UE on wall: L stretch x 3, with gentle wag the tail;  Hip abdct/ addct  3 sets of 5, Heel/toe raises x 15; vertical squats x 10; Leg swings into hip flexion/ ext x 10 each * L stretch * farmer carry with bilat rainbow hand floats under water at side -walking forward / backward -> repeated with yellow hand floats  * straddling noodle and holding corner in deeper water:  cycling, hip abdct/ addct, cross country ski - occasional cues for trunk/LE position  Pt requires the buoyancy and hydrostatic pressure of water for support, and to offload joints by unweighting joint load by at least 50 % in navel deep water and by at  least 75-80% in chest to neck deep water.  Viscosity of the water is needed for resistance of strengthening. Water current perturbations provides challenge to standing balance requiring increased core activation.      PATIENT EDUCATION:  Education  details:exercise progressions/ modifications  Person educated: Patient Education method: Explanation Education comprehension: verbalized understanding  HOME EXERCISE PROGRAM: [id number] assigned last episode  ASSESSMENT:  CLINICAL IMPRESSION: Pt reported gradual reduction of low back pain during session, especially with farmer carry (bilat hand floats at sides).  Pt reported elimination of back pain after suspended cycling in deeper water.  Pt required transport to lobby from pool due to not having RW today.  Goals ongoing. Will plan to begin creating aquatic HEP as pt has membership to [facility] and plans to go after d/c to PT (hasn't been there in 1 yr).    Initial Impression Patient is a 67 y.o. f who was seen today for physical therapy evaluation and treatment for s/p right THR with LBP.  Pt is well known to this clinic as she has just finished up an episode of PT in June both land and aquatic post right THR.  She was also going to [facility] waiting for her appointment here at [facility] in the interim at their pool.  She has an additional dx of chronic LBP which is her main complaint today. Pt reports compliance with land based exercises as instructed previously. She presents using wc to setting as she did not bring her AD.  Testing demonstrates continued residual weakness in right hip from recent replacement along with evident core strength deficits.  Her pain level is low unless amb.  Increases significantly amb without AD which she does need for safety.  She will benefit from skilled aquatic PT to instruct pt on techniques to improve le and core strength, improve balance, gait train submerged with edu on importance of using AD land based for  safety and she also is a high fall risk as per Sharlene Motts.  She is given list of pools in area and VU of long term management of chronic condition using pool as a tool would require pool access. She will decide if able going forward. Plan to see her pool only.  Will dc with aquatic HEP  OBJECTIVE IMPAIRMENTS: Abnormal gait, decreased balance, decreased knowledge of use of DME, decreased mobility, difficulty walking, decreased strength, obesity, and pain.   ACTIVITY LIMITATIONS: carrying, lifting, standing, squatting, and locomotion level  PARTICIPATION LIMITATIONS: shopping and community activity  PERSONAL FACTORS:  PMH includes Multiple sclerosis, cancer, DM, HTN.  are also affecting patient's functional outcome.   REHAB POTENTIAL: Good  CLINICAL DECISION MAKING: Evolving/moderate complexity  EVALUATION COMPLEXITY: Moderate   GOALS: Goals reviewed with patient? No  SHORT TERM GOALS: Target date: 12/30/23 Pt will tolerate full aquatic sessions consistently without increase in pain and with improving function to demonstrate good toleration and effectiveness of intervention.  Baseline: Goal status: INITIAL  2.  Pt will be indep with aquatic final HEP for continued management of condition. Baseline:  Goal status: INITIAL  3.  Pt to amb with approp AD for safety to and from pool and tolerate entire aquatic session without increase in pain or excessive fatigue Baseline:  Goal status: INITIAL  4.  Pt will improve on Berg balance test to >/= 44/56 to demonstrate a decrease in fall risk Baseline: 38/56 Goal status: INITIAL  5.  Pt will improve right hip flex strength by 5 LBs to demonstrate improved overall physical function Baseline: see chart Goal status: INITIAL  6.  Pt will demonstrate an improved posture with amb using ad. Baseline:  Goal status: INITIAL  LONG TERM GOALS: Target date: to be set as approp at re-cert  PLAN:  PT FREQUENCY: 1-2x/week  PT DURATION: other: 7  weeks  8- 10 visits  PLANNED INTERVENTIONS: 97164- PT Re-evaluation, 97110-Therapeutic exercises, 97530- Therapeutic activity, 97112- Neuromuscular re-education, 97535- Self Care, 16109- Manual therapy, [phone]- Gait training, [phone]- Aquatic Therapy, Balance training, Stair training, Taping, Dry Needling, DME instructions, Cryotherapy, and Moist heat  PLAN FOR NEXT SESSION: Aquatics for core and hip strengthening, gait training, use of approp AD with amb land based; balance retraining, management of chronic condition, aquatic HEP  Mayer Camel, PTA 11/30/23 1:04 PM [facility] [address] [place], [place], [address] Phone: [phone]   Fax:  [phone]   Referring diagnosis? R hip pain Treatment diagnosis? (if different than referring diagnosis) LBP What was this (referring dx) caused by? []  Surgery []  Fall [x]  Ongoing issue []  Arthritis []  Other: ____________  Laterality: []  Rt []  Lt [x]  Both  Check all possible CPT codes:  *CHOOSE 10 OR LESS*    See Planned Interventions listed in the Plan section of the Evaluation.

## 2023-12-01 ENCOUNTER — Ambulatory Visit: Payer: Medicare PPO | Admitting: Neurology

## 2023-12-06 ENCOUNTER — Ambulatory Visit (HOSPITAL_BASED_OUTPATIENT_CLINIC_OR_DEPARTMENT_OTHER): Payer: Medicare PPO | Admitting: Physical Therapy

## 2023-12-06 DIAGNOSIS — M5459 Other low back pain: Secondary | ICD-10-CM | POA: Diagnosis not present

## 2023-12-06 DIAGNOSIS — M25551 Pain in right hip: Secondary | ICD-10-CM

## 2023-12-06 DIAGNOSIS — M6281 Muscle weakness (generalized): Secondary | ICD-10-CM

## 2023-12-06 DIAGNOSIS — R2689 Other abnormalities of gait and mobility: Secondary | ICD-10-CM

## 2023-12-06 NOTE — Therapy (Signed)
OUTPATIENT PHYSICAL THERAPY LOWER EXTREMITY TREATMENT   Patient Name: Alisha Martinez MRN: [id number] DOB:18-Oct-1956, 67 y.o., female Today's Date: 12/06/2023  END OF SESSION:  PT End of Session - 12/06/23 1209     Visit Number 5    Number of Visits 10    Date for PT Re-Evaluation 12/30/23    Authorization Type [employer] MCR    PT Start Time 1200    PT Stop Time 1240    PT Time Calculation (min) 40 min    Behavior During Therapy WFL for tasks assessed/performed              Past Medical History:  Diagnosis Date   Arthritis    Asthma    Cancer (HCC)    "cancerous cells found on pap smear- removed surgically"   Diabetes mellitus without complication (HCC)    Fibromyalgia    Hypertension    MS (multiple sclerosis) (HCC)    Sleep apnea    Past Surgical History:  Procedure Laterality Date   APPENDECTOMY     EYE SURGERY Bilateral 2023   "repaired torn retina"   KNEE SURGERY     TOTAL HIP ARTHROPLASTY Right 12/28/2022   Procedure: RIGHT TOTAL HIP ARTHROPLASTY ANTERIOR APPROACH;  Surgeon: Kathryne Hitch, MD;  Location: [facility] OR;  Service: Orthopedics;  Laterality: Right;   VAGINA SURGERY     "pt states the doctor surgically removed cancerous cells from vaginal area"   Patient Active Problem List   Diagnosis Date Noted   Diabetes mellitus without complication (HCC) 12/31/2022   Hypertension 12/31/2022   Asthma 12/31/2022   Sinus tachycardia 12/31/2022   Dyslipidemia 12/31/2022   OSA (obstructive sleep apnea) 12/31/2022   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   Other fatigue 11/22/2022   Multiple sclerosis (HCC) 10/26/2021   Pain in both lower extremities 10/26/2021   Urinary dysfunction 10/26/2021   Numbness 10/26/2021    PCP: none  REFERRING PROVIDER: Dr Deboraha Sprang  REFERRING DIAG: R hip pain  THERAPY DIAG:  Other low back pain  Other abnormalities of gait and mobility  Muscle weakness (generalized)  Pain in right  hip  Rationale for Evaluation and Treatment: Rehabilitation  ONSET DATE: exacerbation last few months  SUBJECTIVE:   SUBJECTIVE STATEMENT: Pt reports no increase in pain after last session.  Pt plans to begin going to [facility] this week or next week.  Would like exercises to complete while there.    POOL ACCESS:  Member of [employer], plans to go after d/c from PT.   PERTINENT HISTORY: PMH includes Multiple sclerosis, cancer, DM, HTN. Status post total replacement of right hip    PAIN:  Are you having pain? Yes: NPRS scale: current 2/10  Pain location: across lumbar spine into gluts Pain description: ache Aggravating factors: walking; standing Relieving factors: sitting; ibuprofen; massage  PRECAUTIONS: None  RED FLAGS: None   WEIGHT BEARING RESTRICTIONS: No    FALLS:  Has patient fallen in last 6 months? No  LIVING ENVIRONMENT: Lives with: lives with their daughter Lives in: House/apartment Stairs: Yes: External: 17 steps; on right going up Has following equipment at home: Single point cane  OCCUPATION: retired/disability  PLOF: Independent, line dancing, shopping, usually drives.   PATIENT GOALS: walk with improved posture  NEXT MD VISIT: as needed  OBJECTIVE:  Note: Objective measures were completed at Evaluation unless otherwise noted.  DIAGNOSTIC FINDINGS: 09/21/23 Lumbar x-ray 2 views of the lumbar spine shows severe degenerative changes at multiple  levels.  The lateral view shows complete loss of lumbar lordosis.   PATIENT SURVEYS:  FOTO Primary score of 45% with goal of 54%  COGNITION: Overall cognitive status: Within functional limits for tasks assessed     SENSATION: Burning in feet due to diabetes    POSTURE: decreased lumbar lordosis  PALPATION: Slight TTP throughout lumbar   LUMBAR  Flex full Side bending limited R/L by 50%   LOWER EXTREMITY ROM:   LOWER EXTREMITY MMT:  MMT Right eval Left eval  Hip flexion 33.4 45.7  Hip  extension    Hip abduction 38.5 39.7  Hip adduction    Hip internal rotation    Hip external rotation    Knee flexion    Knee extension    Ankle dorsiflexion    Ankle plantarflexion    Ankle inversion    Ankle eversion     (Blank rows = not tested)  Lumbar Test Slump neg  FUNCTIONAL TESTS:  5 times sit to stand: 11.47 Berg Balance Scale: 38/56  TUG: 16.56 without AD GAIT: Distance walked: 20 ft Assistive device utilized:  none with her today arrives in [facility].  Needs walker/cane Level of assistance: Modified independence Comments: offloading right in stance.  Bilateral gross lateral displacement ambulating without ad (did not have with her at eval).  Slight hip circumduction bilaterally to clear feet from floor   TODAY'S TREATMENT:                                                                                                                              Pt seen for aquatic therapy today.  Treatment took place in water 3.5-4.75 ft in depth at the [facility] pool. Temp of water was 91.  Pt entered/exited the pool via stairs using step to pattern with hand rail.  *UE on yellow hand floats, with alternating rowing motion:  walking forward, backward x 4 laps each with cues for even step length  *  side stepping with arm addct/abdct with yellow hand floats x 2 laps * UE on wall: L stretch x 3,  Hip abdct/ addct  2 sets of 5, Heel raises x 15; vertical squats x 10; Leg swings into hip flexion/ ext 2 x 5 each * toe taps to 1st step without UE support - alternating LEs, and working on slow controlled motion x 10 (improved with repetition)  * UE on yellow hand floats:  tandem gait forward/ backward with short relaxed squat for recovery between direction  * straddling noodle and holding corner in deeper water:  cycling, hip abdct/ addct, cross country ski - occasional cues for trunk/LE position  Pt requires the buoyancy and hydrostatic pressure of water for support, and to offload  joints by unweighting joint load by at least 50 % in navel deep water and by at least 75-80% in chest to neck deep water.  Viscosity of the water is needed for resistance of strengthening. Water current  perturbations provides challenge to standing balance requiring increased core activation.      PATIENT EDUCATION:  Education details:exercise progressions/ modifications  Person educated: Patient Education method: Explanation Education comprehension: verbalized understanding  HOME EXERCISE PROGRAM: [id number] assigned last episode  ASSESSMENT:  CLINICAL IMPRESSION: Pt requires short rest breaks to stretch back after completing several laps of walking in all 3 directions.  She reported mild increase in tightness and pain with completion of tandem gait.  Pain relieved with decompression while sitting on noodle in deeper water.  Will plan to issue aquatic HEP next visit, as pt has membership to [facility] and plans to begin going. Goals are ongoing.    Initial Impression Patient is a 67 y.o. f who was seen today for physical therapy evaluation and treatment for s/p right THR with LBP.  Pt is well known to this clinic as she has just finished up an episode of PT in June both land and aquatic post right THR.  She was also going to [facility] waiting for her appointment here at [facility] in the interim at their pool.  She has an additional dx of chronic LBP which is her main complaint today. Pt reports compliance with land based exercises as instructed previously. She presents using wc to setting as she did not bring her AD.  Testing demonstrates continued residual weakness in right hip from recent replacement along with evident core strength deficits.  Her pain level is low unless amb.  Increases significantly amb without AD which she does need for safety.  She will benefit from skilled aquatic PT to instruct pt on techniques to improve le and core strength, improve balance, gait train submerged with  edu on importance of using AD land based for safety and she also is a high fall risk as per Sharlene Motts.  She is given list of pools in area and VU of long term management of chronic condition using pool as a tool would require pool access. She will decide if able going forward. Plan to see her pool only.  Will dc with aquatic HEP  OBJECTIVE IMPAIRMENTS: Abnormal gait, decreased balance, decreased knowledge of use of DME, decreased mobility, difficulty walking, decreased strength, obesity, and pain.   ACTIVITY LIMITATIONS: carrying, lifting, standing, squatting, and locomotion level  PARTICIPATION LIMITATIONS: shopping and community activity  PERSONAL FACTORS:  PMH includes Multiple sclerosis, cancer, DM, HTN.  are also affecting patient's functional outcome.   REHAB POTENTIAL: Good  CLINICAL DECISION MAKING: Evolving/moderate complexity  EVALUATION COMPLEXITY: Moderate   GOALS: Goals reviewed with patient? No  SHORT TERM GOALS: Target date: 12/30/23 Pt will tolerate full aquatic sessions consistently without increase in pain and with improving function to demonstrate good toleration and effectiveness of intervention.  Baseline: Goal status:Met - 12/06/23  2.  Pt will be indep with aquatic final HEP for continued management of condition. Baseline:  Goal status: INITIAL  3.  Pt to amb with approp AD for safety to and from pool and tolerate entire aquatic session without increase in pain or excessive fatigue Baseline: walking to/from session with rollator, but with fatigue and pain - 12/06/23 Goal status: In progress   4.  Pt will improve on Berg balance test to >/= 44/56 to demonstrate a decrease in fall risk Baseline: 38/56 Goal status: INITIAL  5.  Pt will improve right hip flex strength by 5 LBs to demonstrate improved overall physical function Baseline: see chart Goal status: INITIAL  6.  Pt will demonstrate an improved posture with  amb using ad. Baseline:  Goal status:  INITIAL  LONG TERM GOALS: Target date: to be set as approp at re-cert     PLAN:  PT FREQUENCY: 1-2x/week  PT DURATION: other: 7 weeks  8- 10 visits  PLANNED INTERVENTIONS: 97164- PT Re-evaluation, 97110-Therapeutic exercises, 97530- Therapeutic activity, 97112- Neuromuscular re-education, 97535- Self Care, 40981- Manual therapy, [phone]- Gait training, [phone]- Aquatic Therapy, Balance training, Stair training, Taping, Dry Needling, DME instructions, Cryotherapy, and Moist heat  PLAN FOR NEXT SESSION: Aquatics for core and hip strengthening, gait training, use of approp AD with amb land based; balance retraining, management of chronic condition, aquatic HEP   Mayer Camel, PTA 12/06/23 1:46 PM [facility] [address] [place], [place], [address] Phone: [phone]   Fax:  [phone]   Referring diagnosis? R hip pain Treatment diagnosis? (if different than referring diagnosis) LBP What was this (referring dx) caused by? []  Surgery []  Fall [x]  Ongoing issue []  Arthritis []  Other: ____________  Laterality: []  Rt []  Lt [x]  Both  Check all possible CPT codes:  *CHOOSE 10 OR LESS*    See Planned Interventions listed in the Plan section of the Evaluation.

## 2023-12-08 ENCOUNTER — Encounter (HOSPITAL_BASED_OUTPATIENT_CLINIC_OR_DEPARTMENT_OTHER): Payer: Self-pay | Admitting: Physical Therapy

## 2023-12-08 ENCOUNTER — Ambulatory Visit (HOSPITAL_BASED_OUTPATIENT_CLINIC_OR_DEPARTMENT_OTHER): Payer: Medicare PPO | Admitting: Physical Therapy

## 2023-12-08 DIAGNOSIS — M5459 Other low back pain: Secondary | ICD-10-CM

## 2023-12-08 DIAGNOSIS — M6281 Muscle weakness (generalized): Secondary | ICD-10-CM

## 2023-12-08 DIAGNOSIS — R2689 Other abnormalities of gait and mobility: Secondary | ICD-10-CM

## 2023-12-08 NOTE — Therapy (Signed)
OUTPATIENT PHYSICAL THERAPY LOWER EXTREMITY TREATMENT   Patient Name: Alisha Martinez MRN: [id number] DOB:06/27/1956, 67 y.o., female Today's Date: 12/08/2023  END OF SESSION:  PT End of Session - 12/08/23 0952     Visit Number 6    Number of Visits 10    Date for PT Re-Evaluation 12/30/23    Authorization Type [employer] MCR    PT Start Time 0949    PT Stop Time 1030    PT Time Calculation (min) 41 min    Activity Tolerance Patient tolerated treatment well    Behavior During Therapy WFL for tasks assessed/performed              Past Medical History:  Diagnosis Date   Arthritis    Asthma    Cancer (HCC)    "cancerous cells found on pap smear- removed surgically"   Diabetes mellitus without complication (HCC)    Fibromyalgia    Hypertension    MS (multiple sclerosis) (HCC)    Sleep apnea    Past Surgical History:  Procedure Laterality Date   APPENDECTOMY     EYE SURGERY Bilateral 2023   "repaired torn retina"   KNEE SURGERY     TOTAL HIP ARTHROPLASTY Right 12/28/2022   Procedure: RIGHT TOTAL HIP ARTHROPLASTY ANTERIOR APPROACH;  Surgeon: Kathryne Hitch, MD;  Location: [facility] OR;  Service: Orthopedics;  Laterality: Right;   VAGINA SURGERY     "pt states the doctor surgically removed cancerous cells from vaginal area"   Patient Active Problem List   Diagnosis Date Noted   Diabetes mellitus without complication (HCC) 12/31/2022   Hypertension 12/31/2022   Asthma 12/31/2022   Sinus tachycardia 12/31/2022   Dyslipidemia 12/31/2022   OSA (obstructive sleep apnea) 12/31/2022   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   Other fatigue 11/22/2022   Multiple sclerosis (HCC) 10/26/2021   Pain in both lower extremities 10/26/2021   Urinary dysfunction 10/26/2021   Numbness 10/26/2021    PCP: none  REFERRING PROVIDER: Dr Deboraha Sprang  REFERRING DIAG: R hip pain  THERAPY DIAG:  Other low back pain  Other abnormalities of gait and  mobility  Muscle weakness (generalized)  Rationale for Evaluation and Treatment: Rehabilitation  ONSET DATE: exacerbation last few months  SUBJECTIVE:   SUBJECTIVE STATEMENT: Pt reports no back pain.  Got a anti inflammation shot yesterday at PCP and have no pain.  MD said it would last about 1 day.  Went shopping   POOL ACCESS:  Member of [employer], plans to go after d/c from PT.   PERTINENT HISTORY: PMH includes Multiple sclerosis, cancer, DM, HTN. Status post total replacement of right hip    PAIN:  Are you having pain? Yes: NPRS scale: current 0/10  Pain location: across lumbar spine into gluts Pain description: ache Aggravating factors: walking; standing Relieving factors: sitting; ibuprofen; massage  PRECAUTIONS: None  RED FLAGS: None   WEIGHT BEARING RESTRICTIONS: No    FALLS:  Has patient fallen in last 6 months? No  LIVING ENVIRONMENT: Lives with: lives with their daughter Lives in: House/apartment Stairs: Yes: External: 17 steps; on right going up Has following equipment at home: Single point cane  OCCUPATION: retired/disability  PLOF: Independent, line dancing, shopping, usually drives.   PATIENT GOALS: walk with improved posture  NEXT MD VISIT: as needed  OBJECTIVE:  Note: Objective measures were completed at Evaluation unless otherwise noted.  DIAGNOSTIC FINDINGS: 09/21/23 Lumbar x-ray 2 views of the lumbar spine shows severe degenerative  changes at multiple  levels.  The lateral view shows complete loss of lumbar lordosis.   PATIENT SURVEYS:  FOTO Primary score of 45% with goal of 54%  COGNITION: Overall cognitive status: Within functional limits for tasks assessed     SENSATION: Burning in feet due to diabetes    POSTURE: decreased lumbar lordosis  PALPATION: Slight TTP throughout lumbar   LUMBAR  Flex full Side bending limited R/L by 50%   LOWER EXTREMITY ROM:   LOWER EXTREMITY MMT:  MMT Right eval Left eval  Hip flexion  33.4 45.7  Hip extension    Hip abduction 38.5 39.7  Hip adduction    Hip internal rotation    Hip external rotation    Knee flexion    Knee extension    Ankle dorsiflexion    Ankle plantarflexion    Ankle inversion    Ankle eversion     (Blank rows = not tested)  Lumbar Test Slump neg  FUNCTIONAL TESTS:  5 times sit to stand: 11.47 Berg Balance Scale: 38/56  TUG: 16.56 without AD GAIT: Distance walked: 20 ft Assistive device utilized:  none with her today arrives in [facility]  Needs walker/cane Level of assistance: Modified independence Comments: offloading right in stance.  Bilateral gross lateral displacement ambulating without ad (did not have with her at eval).  Slight hip circumduction bilaterally to clear feet from floor   TODAY'S TREATMENT:                                                                                                                              Pt seen for aquatic therapy today.  Treatment took place in water 3.5-4.75 ft in depth at the [facility] pool. Temp of water was 91.  Pt entered/exited the pool via stairs using step to pattern with hand rail.   Exercises - Side Stepping  - Forward Backward Tandem Walking   - Standing 'L' Stretch at [employer]  - Standing Hip Flexion Extension at [employer]   - Standing March at [facility] - Squat  - Standing Toe Taps  - 1 x daily - 1-3 x weekly - 1-2 sets - 10-15 reps - Seated Straddle on Flotation Forward Breast Stroke Arms and Bicycle Legs  - 1 x daily - 1-3 x weekly - 1-2 sets - 10 reps  Pt requires the buoyancy and hydrostatic pressure of water for support, and to offload joints by unweighting joint load by at least 50 % in navel deep water and by at least 75-80% in chest to neck deep water.  Viscosity of the water is needed for resistance of strengthening. Water current perturbations provides challenge to standing balance requiring increased core activation.      PATIENT EDUCATION:  Education  details:exercise progressions/ modifications  Person educated: Patient Education method: Explanation Education comprehension: verbalized understanding  HOME EXERCISE PROGRAM: [email or web address] assigned last episode  Aquatics This aquatic home exercise program from [employer]  utilizes pictures from land based exercises, but has been adapted prior to lamination and issuance.   Access Code: [email or web address] URL: [email or web address] Date: 12/08/2023 Prepared by: Geni Bers  Exercises - Side Stepping  - 1 x daily - 1-3 x weekly - Forward Backward Tandem Walking  - 1 x daily - 1-3 x weekly - Standing 'L' Stretch at [employer]  - 1 x daily - 1-3 x weekly - 1-2 sets - Standing Hip Flexion Extension at [employer]  - 1 x daily - 1-3 x weekly - 1-2 sets - 10-15 reps - Standing March at [facility]  - 1 x daily - 1-3 x weekly - 1-2 sets - 10-15 reps - Squat  - 1 x daily - 1-3 x weekly - 1-2 sets - 10-15 reps - Standing Toe Taps  - 1 x daily - 1-3 x weekly - 1-2 sets - 10-15 reps - Seated Straddle on Flotation Forward Breast Stroke Arms and Bicycle Legs  - 1 x daily - 1-3 x weekly - 1-2 sets - 10 reps    ASSESSMENT:  CLINICAL IMPRESSION: Pt requires short rest breaks to stretch back after completing several laps of walking in all 3 directions.  She reported mild increase in tightness and pain with completion of tandem gait.  Pain relieved with decompression while sitting on noodle in deeper water.  Will plan to issue aquatic HEP next visit, as pt has membership to [facility] and plans to begin going. Goals are ongoing.    Initial Impression Patient is a 67 y.o. f who was seen today for physical therapy evaluation and treatment for s/p right THR with LBP.  Pt is well known to this clinic as she has just finished up an episode of PT in June both land and aquatic post right THR.  She was also going to [facility] waiting for her appointment here at [facility] in the interim at their pool.  She has an  additional dx of chronic LBP which is her main complaint today. Pt reports compliance with land based exercises as instructed previously. She presents using wc to setting as she did not bring her AD.  Testing demonstrates continued residual weakness in right hip from recent replacement along with evident core strength deficits.  Her pain level is low unless amb.  Increases significantly amb without AD which she does need for safety.  She will benefit from skilled aquatic PT to instruct pt on techniques to improve le and core strength, improve balance, gait train submerged with edu on importance of using AD land based for safety and she also is a high fall risk as per Sharlene Motts.  She is given list of pools in area and VU of long term management of chronic condition using pool as a tool would require pool access. She will decide if able going forward. Plan to see her pool only.  Will dc with aquatic HEP  OBJECTIVE IMPAIRMENTS: Abnormal gait, decreased balance, decreased knowledge of use of DME, decreased mobility, difficulty walking, decreased strength, obesity, and pain.   ACTIVITY LIMITATIONS: carrying, lifting, standing, squatting, and locomotion level  PARTICIPATION LIMITATIONS: shopping and community activity  PERSONAL FACTORS:  PMH includes Multiple sclerosis, cancer, DM, HTN.  are also affecting patient's functional outcome.   REHAB POTENTIAL: Good  CLINICAL DECISION MAKING: Evolving/moderate complexity  EVALUATION COMPLEXITY: Moderate   GOALS: Goals reviewed with patient? No  SHORT TERM GOALS: Target date: 12/30/23 Pt will tolerate full aquatic sessions consistently without increase in pain and  with improving function to demonstrate good toleration and effectiveness of intervention.  Baseline: Goal status:Met - 12/06/23  2.  Pt will be indep with aquatic final HEP for continued management of condition. Baseline:  Goal status: INITIAL  3.  Pt to amb with approp AD for safety to and from  pool and tolerate entire aquatic session without increase in pain or excessive fatigue Baseline: walking to/from session with rollator, but with fatigue and pain - 12/06/23 Goal status: In progress   4.  Pt will improve on Berg balance test to >/= 44/56 to demonstrate a decrease in fall risk Baseline: 38/56 Goal status: INITIAL  5.  Pt will improve right hip flex strength by 5 LBs to demonstrate improved overall physical function Baseline: see chart Goal status: INITIAL  6.  Pt will demonstrate an improved posture with amb using ad. Baseline:  Goal status: INITIAL  LONG TERM GOALS: Target date: to be set as approp at re-cert     PLAN:  PT FREQUENCY: 1-2x/week  PT DURATION: other: 7 weeks  8- 10 visits  PLANNED INTERVENTIONS: 97164- PT Re-evaluation, 97110-Therapeutic exercises, 97530- Therapeutic activity, 97112- Neuromuscular re-education, 97535- Self Care, 11914- Manual therapy, [phone]- Gait training, [phone]- Aquatic Therapy, Balance training, Stair training, Taping, Dry Needling, DME instructions, Cryotherapy, and Moist heat  PLAN FOR NEXT SESSION: Aquatics for core and hip strengthening, gait training, use of approp AD with amb land based; balance retraining, management of chronic condition, aquatic HEP  Corrie Dandy Tomma Lightning) Shelita Steptoe MPT 12/08/23 1:12 PM [facility] [address] [place], [place], [address] Phone: [phone]   Fax:  [phone]    Referring diagnosis? R hip pain Treatment diagnosis? (if different than referring diagnosis) LBP What was this (referring dx) caused by? []  Surgery []  Fall [x]  Ongoing issue []  Arthritis []  Other: ____________  Laterality: []  Rt []  Lt [x]  Both  Check all possible CPT codes:  *CHOOSE 10 OR LESS*    See Planned Interventions listed in the Plan section of the Evaluation.

## 2023-12-12 ENCOUNTER — Encounter (HOSPITAL_BASED_OUTPATIENT_CLINIC_OR_DEPARTMENT_OTHER): Payer: Self-pay | Admitting: Physical Therapy

## 2023-12-12 ENCOUNTER — Ambulatory Visit (HOSPITAL_BASED_OUTPATIENT_CLINIC_OR_DEPARTMENT_OTHER): Payer: Medicare PPO | Admitting: Physical Therapy

## 2023-12-12 DIAGNOSIS — M5459 Other low back pain: Secondary | ICD-10-CM | POA: Diagnosis not present

## 2023-12-12 DIAGNOSIS — R2689 Other abnormalities of gait and mobility: Secondary | ICD-10-CM

## 2023-12-12 DIAGNOSIS — M6281 Muscle weakness (generalized): Secondary | ICD-10-CM

## 2023-12-12 NOTE — Therapy (Signed)
OUTPATIENT PHYSICAL THERAPY LOWER EXTREMITY TREATMENT   Patient Name: Alisha Martinez MRN: [id number] DOB:[birth year], 67 y.o., female Today's Date: 12/12/2023  END OF SESSION:  PT End of Session - 12/12/23 0945     Visit Number 7    Number of Visits 10    Date for PT Re-Evaluation 12/30/23    Authorization Type [employer] MCR    Authorization Time Period 10 visits 11/08/23-12/30/23    Authorization - Visit Number 7    Authorization - Number of Visits 10    Progress Note Due on Visit 10    PT Start Time 0945    PT Stop Time 1025    PT Time Calculation (min) 40 min    Activity Tolerance Patient tolerated treatment well    Behavior During Therapy WFL for tasks assessed/performed              Past Medical History:  Diagnosis Date   Arthritis    Asthma    Cancer (HCC)    "cancerous cells found on pap smear- removed surgically"   Diabetes mellitus without complication (HCC)    Fibromyalgia    Hypertension    MS (multiple sclerosis) (HCC)    Sleep apnea    Past Surgical History:  Procedure Laterality Date   APPENDECTOMY     EYE SURGERY Bilateral 2023   "repaired torn retina"   KNEE SURGERY     TOTAL HIP ARTHROPLASTY Right 12/28/2022   Procedure: RIGHT TOTAL HIP ARTHROPLASTY ANTERIOR APPROACH;  Surgeon: Kathryne Hitch, MD;  Location: [facility] OR;  Service: Orthopedics;  Laterality: Right;   VAGINA SURGERY     "pt states the doctor surgically removed cancerous cells from vaginal area"   Patient Active Problem List   Diagnosis Date Noted   Diabetes mellitus without complication (HCC) 12/31/2022   Hypertension 12/31/2022   Asthma 12/31/2022   Sinus tachycardia 12/31/2022   Dyslipidemia 12/31/2022   OSA (obstructive sleep apnea) 12/31/2022   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   Other fatigue 11/22/2022   Multiple sclerosis (HCC) 10/26/2021   Pain in both lower extremities 10/26/2021   Urinary dysfunction 10/26/2021   Numbness 10/26/2021    PCP:  none  REFERRING PROVIDER: Dr Deboraha Sprang  REFERRING DIAG: R hip pain  THERAPY DIAG:  Other low back pain  Other abnormalities of gait and mobility  Muscle weakness (generalized)  Rationale for Evaluation and Treatment: Rehabilitation  ONSET DATE: exacerbation last few months  SUBJECTIVE:   SUBJECTIVE STATEMENT: "I felt great on Friday (after session)"    POOL ACCESS:  Member of [employer], plans to go after d/c from PT.   PERTINENT HISTORY: PMH includes Multiple sclerosis, cancer, DM, HTN. Status post total replacement of right hip    PAIN:  Are you having pain? Yes: NPRS scale: current 2/10  Pain location: across lumbar spine into gluts Pain description: ache Aggravating factors: walking; standing Relieving factors: sitting; ibuprofen; massage  PRECAUTIONS: None  RED FLAGS: None   WEIGHT BEARING RESTRICTIONS: No    FALLS:  Has patient fallen in last 6 months? No  LIVING ENVIRONMENT: Lives with: lives with their daughter Lives in: House/apartment Stairs: Yes: External: 17 steps; on right going up Has following equipment at home: Single point cane  OCCUPATION: retired/disability  PLOF: Independent, line dancing, shopping, usually drives.   PATIENT GOALS: walk with improved posture  NEXT MD VISIT: as needed  OBJECTIVE:  Note: Objective measures were completed at Evaluation unless otherwise noted.  DIAGNOSTIC  FINDINGS: 09/21/23 Lumbar x-ray 2 views of the lumbar spine shows severe degenerative changes at multiple  levels.  The lateral view shows complete loss of lumbar lordosis.   PATIENT SURVEYS:  Eval:  FOTO 65% GOAL 63%  12/12/23: FOTO 67%   COGNITION: Overall cognitive status: Within functional limits for tasks assessed     SENSATION: Burning in feet due to diabetes    POSTURE: decreased lumbar lordosis  PALPATION: Slight TTP throughout lumbar   LUMBAR  Flex full Side bending limited R/L by 50%   LOWER EXTREMITY  ROM:   LOWER EXTREMITY MMT:  MMT Right eval Left eval  Hip flexion 33.4 45.7  Hip extension    Hip abduction 38.5 39.7  Hip adduction    Hip internal rotation    Hip external rotation    Knee flexion    Knee extension    Ankle dorsiflexion    Ankle plantarflexion    Ankle inversion    Ankle eversion     (Blank rows = not tested)  Lumbar Test Slump neg  FUNCTIONAL TESTS:  5 times sit to stand: 11.47 Berg Balance Scale: 38/56  TUG: 16.56 without AD  GAIT: Distance walked: 20 ft Assistive device utilized:  none with her today arrives in [facility].  Needs walker/cane Level of assistance: Modified independence Comments: offloading right in stance.  Bilateral gross lateral displacement ambulating without ad (did not have with her at eval).  Slight hip circumduction bilaterally to clear feet from floor   TODAY'S TREATMENT:                                                                                                                              Pt seen for aquatic therapy today.  Treatment took place in water 3.5-4.75 ft in depth at the [facility] pool. Temp of water was 91.  Pt entered/exited the pool via stairs using step-to pattern with hand rail. -FOTO - UE on rainbow hand floats:  alternating row motion with forward/ backward walking, multiple laps - Side Stepping with rainbow hand floats x 1 lap, with yellow hand floats x 1 lap - Forward Backward Tandem Walking, UE on yellow hand floats   - Standing 'L' Stretch at [employer] x 15 sec x 3 - at [employer]: Standing Hip Flexion/ Extension; hip abdct/ addct x 10 each;  Heel raises bilat x 10, unilateral x 10 each;  -Marching forward/ backward with row motion with arms on rainbow hand floats 2 laps - Squat pushing rainbow hand float under water x 10; x 10 holding wall - L stretch at wall x 15 sec x 3 - Seated Straddle on Flotation UE on wall, and Bicycle Legs    Pt requires the buoyancy and hydrostatic pressure of water  for support, and to offload joints by unweighting joint load by at least 50 % in navel deep water and by at least 75-80% in chest to neck deep water.  Viscosity of the water is needed for resistance of strengthening. Water current perturbations provides challenge to standing balance requiring increased core activation.      PATIENT EDUCATION:  Education details:exercise progressions/ modifications  Person educated: Patient Education method: Explanation Education comprehension: verbalized understanding  HOME EXERCISE PROGRAM: [email or web address] assigned last episode  Aquatics This aquatic home exercise program from [employer] utilizes pictures from land based exercises, but has been adapted prior to lamination and issuance.   Access Code: [email or web address] URL: [email or web address] Date: 12/08/2023 Prepared by: Geni Bers     ASSESSMENT:  CLINICAL IMPRESSION: Positive response to aquatic therapy thus far; Pt reporting less stiffness, and less pain with gait.  Pt reported mild increase in tightness and pain with completion of tandem gait;  Pain relieved with L stretch at wall.  Pt's FOTO score improved by 2 points.  Pt verbalized readiness to d/c after next visit ; she is a member of [employer] and plans to exercise there at d/c.  Therapist to check strength and BERG next visit, and finalize HEP.    Initial Impression Patient is a 67 y.o. f who was seen today for physical therapy evaluation and treatment for s/p right THR with LBP.  Pt is well known to this clinic as she has just finished up an episode of PT in June both land and aquatic post right THR.  She was also going to [facility] waiting for her appointment here at [facility] in the interim at their pool.  She has an additional dx of chronic LBP which is her main complaint today. Pt reports compliance with land based exercises as instructed previously. She presents using wc to setting as she did not bring her AD.  Testing  demonstrates continued residual weakness in right hip from recent replacement along with evident core strength deficits.  Her pain level is low unless amb.  Increases significantly amb without AD which she does need for safety.  She will benefit from skilled aquatic PT to instruct pt on techniques to improve le and core strength, improve balance, gait train submerged with edu on importance of using AD land based for safety and she also is a high fall risk as per Sharlene Motts.  She is given list of pools in area and VU of long term management of chronic condition using pool as a tool would require pool access. She will decide if able going forward. Plan to see her pool only.  Will dc with aquatic HEP  OBJECTIVE IMPAIRMENTS: Abnormal gait, decreased balance, decreased knowledge of use of DME, decreased mobility, difficulty walking, decreased strength, obesity, and pain.   ACTIVITY LIMITATIONS: carrying, lifting, standing, squatting, and locomotion level  PARTICIPATION LIMITATIONS: shopping and community activity  PERSONAL FACTORS:  PMH includes Multiple sclerosis, cancer, DM, HTN.  are also affecting patient's functional outcome.   REHAB POTENTIAL: Good  CLINICAL DECISION MAKING: Evolving/moderate complexity  EVALUATION COMPLEXITY: Moderate   GOALS: Goals reviewed with patient? No  SHORT TERM GOALS: Target date: 12/30/23 Pt will tolerate full aquatic sessions consistently without increase in pain and with improving function to demonstrate good toleration and effectiveness of intervention.  Baseline: Goal status:Met - 12/06/23  2.  Pt will be indep with aquatic final HEP for continued management of condition. Baseline:  Goal status: INITIAL  3.  Pt to amb with approp AD for safety to and from pool and tolerate entire aquatic session without increase in pain or excessive fatigue Baseline: walking to/from session with rollator, but with fatigue and pain -  12/06/23 Goal status: In progress   4.  Pt  will improve on Berg balance test to >/= 44/56 to demonstrate a decrease in fall risk Baseline: 38/56 Goal status: INITIAL  5.  Pt will improve right hip flex strength by 5 LBs to demonstrate improved overall physical function Baseline: see chart Goal status: INITIAL  6.  Pt will demonstrate an improved posture with amb using ad. Baseline:  Goal status: MET - 12/12/23  LONG TERM GOALS: Target date: to be set as approp at re-cert     PLAN:  PT FREQUENCY: 1-2x/week  PT DURATION: other: 7 weeks  8- 10 visits  PLANNED INTERVENTIONS: 97164- PT Re-evaluation, 97110-Therapeutic exercises, 97530- Therapeutic activity, 97112- Neuromuscular re-education, 97535- Self Care, 16109- Manual therapy, [phone]- Gait training, [phone]- Aquatic Therapy, Balance training, Stair training, Taping, Dry Needling, DME instructions, Cryotherapy, and Moist heat  PLAN FOR NEXT SESSION: Aquatics for core and hip strengthening, gait training, use of approp AD with amb land based; balance retraining, management of chronic condition, aquatic HEP  Mayer Camel, PTA 12/12/23 12:55 PM [facility] [address] [place], [place], [address] Phone: [phone]   Fax:  [phone]     Referring diagnosis? R hip pain Treatment diagnosis? (if different than referring diagnosis) LBP What was this (referring dx) caused by? []  Surgery []  Fall [x]  Ongoing issue []  Arthritis []  Other: ____________  Laterality: []  Rt []  Lt [x]  Both  Check all possible CPT codes:  *CHOOSE 10 OR LESS*    See Planned Interventions listed in the Plan section of the Evaluation.

## 2023-12-15 ENCOUNTER — Ambulatory Visit (HOSPITAL_BASED_OUTPATIENT_CLINIC_OR_DEPARTMENT_OTHER): Payer: Medicare PPO | Admitting: Physical Therapy

## 2023-12-15 ENCOUNTER — Encounter (HOSPITAL_BASED_OUTPATIENT_CLINIC_OR_DEPARTMENT_OTHER): Payer: Self-pay | Admitting: Physical Therapy

## 2023-12-15 DIAGNOSIS — M5459 Other low back pain: Secondary | ICD-10-CM | POA: Diagnosis not present

## 2023-12-15 DIAGNOSIS — R2689 Other abnormalities of gait and mobility: Secondary | ICD-10-CM

## 2023-12-15 DIAGNOSIS — M25551 Pain in right hip: Secondary | ICD-10-CM

## 2023-12-15 DIAGNOSIS — M6281 Muscle weakness (generalized): Secondary | ICD-10-CM

## 2023-12-15 NOTE — Therapy (Signed)
OUTPATIENT PHYSICAL THERAPY LOWER EXTREMITY TREATMENT  PHYSICAL THERAPY DISCHARGE SUMMARY  Visits from Start of Care: 8  Current functional level related to goals / functional outcomes: Indep with Ads as needed   Remaining deficits: Chronic pain with improvement in management   Education / Equipment: Management of condition/ HEP   Patient agrees to discharge. Patient goals were partially met. Patient is being discharged due to maximized rehab potential.   Patient Name: Alisha Martinez MRN: [id number] DOB:October 10, 1956, 67 y.o., female Today's Date: 12/15/2023  END OF SESSION:  PT End of Session - 12/15/23 0915     Visit Number 8    Number of Visits 10    Date for PT Re-Evaluation 12/30/23    Authorization Type [employer] MCR    Authorization Time Period 10 visits 11/08/23-12/30/23    Authorization - Number of Visits 10    Progress Note Due on Visit 10    PT Start Time 0905    PT Stop Time 0945    PT Time Calculation (min) 40 min    Activity Tolerance Patient tolerated treatment well    Behavior During Therapy WFL for tasks assessed/performed               Past Medical History:  Diagnosis Date   Arthritis    Asthma    Cancer (HCC)    "cancerous cells found on pap smear- removed surgically"   Diabetes mellitus without complication (HCC)    Fibromyalgia    Hypertension    MS (multiple sclerosis) (HCC)    Sleep apnea    Past Surgical History:  Procedure Laterality Date   APPENDECTOMY     EYE SURGERY Bilateral 2023   "repaired torn retina"   KNEE SURGERY     TOTAL HIP ARTHROPLASTY Right 12/28/2022   Procedure: RIGHT TOTAL HIP ARTHROPLASTY ANTERIOR APPROACH;  Surgeon: Kathryne Hitch, MD;  Location: [facility] OR;  Service: Orthopedics;  Laterality: Right;   VAGINA SURGERY     "pt states the doctor surgically removed cancerous cells from vaginal area"   Patient Active Problem List   Diagnosis Date Noted   Diabetes mellitus without complication (HCC) 12/31/2022    Hypertension 12/31/2022   Asthma 12/31/2022   Sinus tachycardia 12/31/2022   Dyslipidemia 12/31/2022   OSA (obstructive sleep apnea) 12/31/2022   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   Other fatigue 11/22/2022   Multiple sclerosis (HCC) 10/26/2021   Pain in both lower extremities 10/26/2021   Urinary dysfunction 10/26/2021   Numbness 10/26/2021    PCP: none  REFERRING PROVIDER: Dr Deboraha Sprang  REFERRING DIAG: R hip pain  THERAPY DIAG:  Other low back pain  Other abnormalities of gait and mobility  Muscle weakness (generalized)  Pain in right hip  Rationale for Evaluation and Treatment: Rehabilitation  ONSET DATE: exacerbation last few months  SUBJECTIVE:   SUBJECTIVE STATEMENT: "Always feel better after aquatic exercise for about 1 day""    POOL ACCESS:  Member of [employer], plans to go after d/c from PT.   PERTINENT HISTORY: PMH includes Multiple sclerosis, cancer, DM, HTN. Status post total replacement of right hip    PAIN:  Are you having pain? Yes: NPRS scale: current 2/10  Pain location: across lumbar spine into gluts Pain description: ache Aggravating factors: walking; standing Relieving factors: sitting; ibuprofen; massage  PRECAUTIONS: None  RED FLAGS: None   WEIGHT BEARING RESTRICTIONS: No    FALLS:  Has patient fallen in last 6 months? No  LIVING  ENVIRONMENT: Lives with: lives with their daughter Lives in: House/apartment Stairs: Yes: External: 17 steps; on right going up Has following equipment at home: Single point cane  OCCUPATION: retired/disability  PLOF: Independent, line dancing, shopping, usually drives.   PATIENT GOALS: walk with improved posture  NEXT MD VISIT: as needed  OBJECTIVE:  Note: Objective measures were completed at Evaluation unless otherwise noted.  DIAGNOSTIC FINDINGS: 09/21/23 Lumbar x-ray 2 views of the lumbar spine shows severe degenerative changes at multiple  levels.  The lateral  view shows complete loss of lumbar lordosis.   PATIENT SURVEYS:  Eval:  FOTO 65% GOAL 63%  12/12/23: FOTO 67%   COGNITION: Overall cognitive status: Within functional limits for tasks assessed     SENSATION: Burning in feet due to diabetes    POSTURE: decreased lumbar lordosis  PALPATION: Slight TTP throughout lumbar   LUMBAR  Flex full Side bending limited R/L by 50%   LOWER EXTREMITY ROM:   LOWER EXTREMITY MMT:  MMT Right eval Left eval R / L 12/15/23  Hip flexion 33.4 45.7 36.6 / 40.4  Hip extension     Hip abduction 38.5 39.7 35.1 / 38.5  Hip adduction     Hip internal rotation     Hip external rotation     Knee flexion     Knee extension     Ankle dorsiflexion     Ankle plantarflexion     Ankle inversion     Ankle eversion      (Blank rows = not tested)  Lumbar Test Slump neg  FUNCTIONAL TESTS:  5 times sit to stand: 11.47 Berg Balance Scale: 38/56  TUG: 16.56 without AD   12/15/23  Berg: 48/56  GAIT: Distance walked: 20 ft Assistive device utilized:  none with her today arrives in [facility].  Needs walker/cane Level of assistance: Modified independence Comments: offloading right in stance.  Bilateral gross lateral displacement ambulating without ad (did not have with her at eval).  Slight hip circumduction bilaterally to clear feet from floor   TODAY'S TREATMENT:                                                                                                                              Pt seen for aquatic therapy today.  Treatment took place in water 3.5-4.75 ft in depth at the [facility] pool. Temp of water was 91.  Pt entered/exited the pool via stairs using step-to pattern with hand rail.   Exercises - Side Stepping  - Forward Backward Tandem Walking   - Standing 'L' Stretch at [employer]  - Standing Hip Flexion Extension at [employer]  - Standing March at [facility]   - Squat   - Standing Toe Taps   - Seated Straddle on  Flotation Forward Breast Stroke Arms and Bicycle Legs    Pt requires the buoyancy and hydrostatic pressure of water for support, and to offload joints by unweighting joint  load by at least 50 % in navel deep water and by at least 75-80% in chest to neck deep water.  Viscosity of the water is needed for resistance of strengthening. Water current perturbations provides challenge to standing balance requiring increased core activation.      PATIENT EDUCATION:  Education details:exercise progressions/ modifications  Person educated: Patient Education method: Explanation Education comprehension: verbalized understanding  HOME EXERCISE PROGRAM: [email or web address] assigned last episode  Aquatics This aquatic home exercise program from [facility] utilizes pictures from land based exercises, but has been adapted prior to lamination and issuance.   Access Code: [email or web address] URL: [email or web address] Date: 12/08/2023 Prepared by: Geni Bers Issued   Exercises - Side Stepping  - 1 x daily - 1-3 x weekly - Forward Backward Tandem Walking  - 1 x daily - 1-3 x weekly - Standing 'L' Stretch at [employer]  - 1 x daily - 1-3 x weekly - 1-2 sets - Standing Hip Flexion Extension at [employer]  - 1 x daily - 1-3 x weekly - 1-2 sets - 10-15 reps - Standing March at [facility]  - 1 x daily - 1-3 x weekly - 1-2 sets - 10-15 reps - Squat  - 1 x daily - 1-3 x weekly - 1-2 sets - 10-15 reps - Standing Toe Taps  - 1 x daily - 1-3 x weekly - 1-2 sets - 10-15 reps - Seated Straddle on Flotation Forward Breast Stroke Arms and Bicycle Legs  - 1 x daily - 1-3 x weekly - 1-2 sets - 10 reps     ASSESSMENT:  CLINICAL IMPRESSION: Pt tested for dc.  Her strength has remained roughly about the same. She has greatly improved with her balance as per berg balance score decreasing her fall risk. She has reached her foto goal and met all other goal set other than strength. She will begin at the [facility] completing  aquatic HEP as issued and instructed.  She demonstrates indep with program today.     Initial Impression Patient is a 67 y.o. f who was seen today for physical therapy evaluation and treatment for s/p right THR with LBP.  Pt is well known to this clinic as she has just finished up an episode of PT in June both land and aquatic post right THR.  She was also going to [facility] waiting for her appointment here at [facility] in the interim at their pool.  She has an additional dx of chronic LBP which is her main complaint today. Pt reports compliance with land based exercises as instructed previously. She presents using wc to setting as she did not bring her AD.  Testing demonstrates continued residual weakness in right hip from recent replacement along with evident core strength deficits.  Her pain level is low unless amb.  Increases significantly amb without AD which she does need for safety.  She will benefit from skilled aquatic PT to instruct pt on techniques to improve le and core strength, improve balance, gait train submerged with edu on importance of using AD land based for safety and she also is a high fall risk as per Sharlene Motts.  She is given list of pools in area and VU of long term management of chronic condition using pool as a tool would require pool access. She will decide if able going forward. Plan to see her pool only.  Will dc with aquatic HEP  OBJECTIVE IMPAIRMENTS: Abnormal gait, decreased balance, decreased knowledge of use of DME, decreased mobility,  difficulty walking, decreased strength, obesity, and pain.   ACTIVITY LIMITATIONS: carrying, lifting, standing, squatting, and locomotion level  PARTICIPATION LIMITATIONS: shopping and community activity  PERSONAL FACTORS:  PMH includes Multiple sclerosis, cancer, DM, HTN.  are also affecting patient's functional outcome.   REHAB POTENTIAL: Good  CLINICAL DECISION MAKING: Evolving/moderate complexity  EVALUATION COMPLEXITY:  Moderate   GOALS: Goals reviewed with patient? No  SHORT TERM GOALS: Target date: 12/30/23 Pt will tolerate full aquatic sessions consistently without increase in pain and with improving function to demonstrate good toleration and effectiveness of intervention.  Baseline: Goal status:Met - 12/06/23  2.  Pt will be indep with aquatic final HEP for continued management of condition. Baseline:  Goal status: Met 12/15/23  3.  Pt to amb with approp AD for safety to and from pool and tolerate entire aquatic session without increase in pain or excessive fatigue Baseline: walking to/from session with rollator, but with fatigue and pain - 12/06/23 Goal status: Met 12/15/23  4.  Pt will improve on Berg balance test to >/= 44/56 to demonstrate a decrease in fall risk Baseline: 38/56 Goal status: Met 12/15/23  5.  Pt will improve right hip flex strength by 5 LBs to demonstrate improved overall physical function Baseline: see chart Goal status: Progress made but goal NM  6.  Pt will demonstrate an improved posture with amb using ad. Baseline:  Goal status: MET - 12/12/23  LONG TERM GOALS: Target date: to be set as approp at re-cert     PLAN:  PT FREQUENCY: 1-2x/week  PT DURATION: other: 7 weeks  8- 10 visits  PLANNED INTERVENTIONS: 97164- PT Re-evaluation, 97110-Therapeutic exercises, 97530- Therapeutic activity, 97112- Neuromuscular re-education, 97535- Self Care, 09811- Manual therapy, [phone]- Gait training, [phone]- Aquatic Therapy, Balance training, Stair training, Taping, Dry Needling, DME instructions, Cryotherapy, and Moist heat  PLAN FOR NEXT SESSION: Aquatics for core and hip strengthening, gait training, use of approp AD with amb land based; balance retraining, management of chronic condition, aquatic HEP  Corrie Dandy [place]) Ornella Coderre MPT 12/15/23 9:44 AM [facility] [address] [place], [place], [address] Phone: [phone]    Fax:  [phone]      Referring diagnosis? R hip pain Treatment diagnosis? (if different than referring diagnosis) LBP What was this (referring dx) caused by? []  Surgery []  Fall [x]  Ongoing issue []  Arthritis []  Other: ____________  Laterality: []  Rt []  Lt [x]  Both  Check all possible CPT codes:  *CHOOSE 10 OR LESS*    See Planned Interventions listed in the Plan section of the Evaluation.

## 2024-01-18 ENCOUNTER — Other Ambulatory Visit: Payer: Self-pay | Admitting: Family Medicine

## 2024-01-18 DIAGNOSIS — Z1231 Encounter for screening mammogram for malignant neoplasm of breast: Secondary | ICD-10-CM

## 2024-01-19 ENCOUNTER — Other Ambulatory Visit: Payer: Self-pay | Admitting: Family Medicine

## 2024-01-19 DIAGNOSIS — N6452 Nipple discharge: Secondary | ICD-10-CM

## 2024-01-19 DIAGNOSIS — R5381 Other malaise: Secondary | ICD-10-CM

## 2024-01-19 DIAGNOSIS — N644 Mastodynia: Secondary | ICD-10-CM

## 2024-01-19 DIAGNOSIS — N6310 Unspecified lump in the right breast, unspecified quadrant: Secondary | ICD-10-CM

## 2024-01-27 ENCOUNTER — Other Ambulatory Visit (HOSPITAL_COMMUNITY): Payer: Self-pay

## 2024-01-27 ENCOUNTER — Telehealth: Payer: Self-pay | Admitting: Neurology

## 2024-01-27 ENCOUNTER — Telehealth: Payer: Self-pay

## 2024-01-27 NOTE — Telephone Encounter (Signed)
PA request has been Submitted. New Encounter created for follow up. For additional info see Pharmacy Prior Auth telephone encounter from 01/27/2024.

## 2024-01-27 NOTE — Telephone Encounter (Signed)
Pharmacy Patient Advocate Encounter   Received notification from Physician's Office that prior authorization for Rebif Rebidose 44MCG/0.5ML auto-injectors is required/requested.   Insurance verification completed.   The patient is insured through [employer] .   Per test claim: PA required; PA submitted to above mentioned insurance via CoverMyMeds Key/confirmation #/EOC [id number] Status is pending

## 2024-01-27 NOTE — Telephone Encounter (Signed)
Pt called stating that she is needing her Interferon Beta-1a (REBIF REBIDOSE) 44 MCG/0.5ML SOAJ to be updated with a new PA. Please advise.

## 2024-01-30 ENCOUNTER — Other Ambulatory Visit: Payer: Self-pay

## 2024-01-30 ENCOUNTER — Other Ambulatory Visit (HOSPITAL_COMMUNITY): Payer: Self-pay

## 2024-01-30 DIAGNOSIS — G35 Multiple sclerosis: Secondary | ICD-10-CM

## 2024-01-30 NOTE — Telephone Encounter (Signed)
Pharmacy Patient Advocate Encounter  Received notification from [facility] that Prior Authorization for Rebif Rebidose 44MCG/0.5ML auto-injectors has been APPROVED from 01/27/2024 to 12/25/2024 with QUANTITY LIMIT of 6ml for a 30 day supply.   PA #/Case ID/Reference #: PA Case ID #: [id number]  Refill too soon per test claim, last filled on 01/16/2024, next fill available on or after 02/06/2024

## 2024-01-30 NOTE — Telephone Encounter (Signed)
Patient is requesting a refill of Rebif to be sent to [facility].

## 2024-01-31 ENCOUNTER — Other Ambulatory Visit: Payer: Self-pay | Admitting: *Deleted

## 2024-01-31 DIAGNOSIS — G35 Multiple sclerosis: Secondary | ICD-10-CM

## 2024-01-31 MED ORDER — REBIF REBIDOSE 44 MCG/0.5ML ~~LOC~~ SOAJ: 0.5000 mL | SUBCUTANEOUS | 0 refills | Status: DC

## 2024-01-31 NOTE — Telephone Encounter (Signed)
E-scribed refill [facility]

## 2024-02-07 ENCOUNTER — Ambulatory Visit: Payer: Medicare PPO

## 2024-02-14 ENCOUNTER — Telehealth: Payer: Self-pay | Admitting: Neurology

## 2024-02-14 DIAGNOSIS — G35 Multiple sclerosis: Secondary | ICD-10-CM

## 2024-02-14 MED ORDER — REBIF REBIDOSE 44 MCG/0.5ML ~~LOC~~ SOAJ: 0.5000 mL | SUBCUTANEOUS | 3 refills | Status: DC

## 2024-02-14 NOTE — Telephone Encounter (Signed)
Pt states that once a year Dr Epimenio Foot prepares a letter re: her Interferon Beta-1a (REBIF REBIDOSE) 44 MCG/0.5ML SOAJ to her pharmacy re: a 1 yr Rx.  Pt said this year she asked to change the pharmacy to [facility] .  Pt states for whatever the reason that request with [facility] was cancelled, she is now asking to switch back to [facility] [phone]

## 2024-02-14 NOTE — Telephone Encounter (Signed)
Called [facility] at [phone]. Spoke w/ Isaiah Serge. They have tried calling pt 4x and sent letter that they were unable to reach her to schedule delivery of Rebif. States if pt calls today, they can have Rebif delivered to her by Thursday. Ask for it to be expedited. Confirmed they have correct phone # for pt.    I called pt at [phone]. She insisted that she has been filling Rebif at [facility] for years. I did explain that typically specialty meds have to be filled through mail order specialty pharmacies. She is aware but states she has filled through [facility] and would like to use them and not [facility]. She confirmed she got their letter and missed calls. I e-scribed rx to [facility]. Asked her to call back if she has any trouble filling.  Scheduled appt for 05/14/24 at 2:30pm with Dr .Epimenio Foot. Added to wait list.

## 2024-02-14 NOTE — Addendum Note (Signed)
Addended by: Arther Abbott on: 02/14/2024 02:09 PM   Modules accepted: Orders

## 2024-02-14 NOTE — Telephone Encounter (Signed)
Pt called wanting to tell the RN that she was correct and that the medication is needing to be sent to [facility] and not the [facility] on [address].

## 2024-02-14 NOTE — Addendum Note (Signed)
Addended by: Arther Abbott on: 02/14/2024 03:05 PM   Modules accepted: Orders

## 2024-02-14 NOTE — Telephone Encounter (Signed)
E-scribed rx to [facility] specialty pharmacy as requested.

## 2024-02-16 ENCOUNTER — Telehealth: Payer: Self-pay | Admitting: Neurology

## 2024-02-16 NOTE — Telephone Encounter (Signed)
Pt called stating that the wrong medication was called in for her. She is needing the Interferon Beta-1a (REBIF REBIDOSE) 44 MCG/0.5ML SOAJ  sent in to [facility] not just the regular Rebif

## 2024-02-16 NOTE — Telephone Encounter (Signed)
Called pt. Explained rx sent 02/15/24 was for Rebif Rebidose. She is being told this is not the rx they have, they have regular Rebif. Aware I will call pharmacy and call her back. She verbalized understanding.   Called [facility] at [phone]. Spoke w/ Courtney Paris. They are seeing prefilled syringes. I relayed we only sent Rebif Rebidose. She placed on hold and found this. Unsure why it was changed to syringes. Will update in their system urgently. Can take a few hours. They will then call the pt.   I called pt back and provided update. She verbalized understanding.  Reminded her about next f/u appt 05/14/24 at 2:30pm w/ Dr. Epimenio Foot.

## 2024-03-23 ENCOUNTER — Ambulatory Visit
Admission: RE | Admit: 2024-03-23 | Discharge: 2024-03-23 | Disposition: A | Discharge status: Home / Self Care | Source: Ambulatory Visit | Attending: Family Medicine | Admitting: Family Medicine

## 2024-03-23 DIAGNOSIS — Z1231 Encounter for screening mammogram for malignant neoplasm of breast: Secondary | ICD-10-CM

## 2024-05-08 ENCOUNTER — Encounter: Payer: Self-pay | Admitting: Neurology

## 2024-05-08 ENCOUNTER — Ambulatory Visit (INDEPENDENT_AMBULATORY_CARE_PROVIDER_SITE_OTHER): Admitting: Neurology

## 2024-05-08 VITALS — BP 112/70 | HR 92 | Ht 64.0 in | Wt 200.5 lb

## 2024-05-08 DIAGNOSIS — G35D Multiple sclerosis, unspecified: Secondary | ICD-10-CM

## 2024-05-08 DIAGNOSIS — R200 Anesthesia of skin: Secondary | ICD-10-CM | POA: Diagnosis not present

## 2024-05-08 DIAGNOSIS — G35 Multiple sclerosis: Secondary | ICD-10-CM | POA: Diagnosis not present

## 2024-05-08 DIAGNOSIS — R39198 Other difficulties with micturition: Secondary | ICD-10-CM

## 2024-05-08 DIAGNOSIS — Z7984 Long term (current) use of oral hypoglycemic drugs: Secondary | ICD-10-CM

## 2024-05-08 DIAGNOSIS — E559 Vitamin D deficiency, unspecified: Secondary | ICD-10-CM

## 2024-05-08 DIAGNOSIS — I10 Essential (primary) hypertension: Secondary | ICD-10-CM

## 2024-05-08 DIAGNOSIS — Z79899 Other long term (current) drug therapy: Secondary | ICD-10-CM

## 2024-05-08 DIAGNOSIS — E119 Type 2 diabetes mellitus without complications: Secondary | ICD-10-CM

## 2024-05-08 DIAGNOSIS — R5383 Other fatigue: Secondary | ICD-10-CM

## 2024-05-08 NOTE — Progress Notes (Signed)
 You note we do sometimes use the  [facility]  PATIENT: Alisha Martinez DOB: August 10, 1956  REFERRING DOCTOR OR PCP: Trellis Fries, MD SOURCE: Patient, notes from [facility] neurology, imaging and lab reports  _________________________________   HISTORICAL  CHIEF COMPLAINT:  Chief Complaint  Patient presents with   Follow-up    Pt in 11 Pt here for MS f/u Pt states no questions or concerns for today's     HISTORY OF PRESENT ILLNESS:  Alisha Martinez is a 68 y.o. woman with MS.  UPDATE 05/08/2024 She is on Rebif  and tolerates it well..    She reports having a recent hip surgery (DJD) and needed a walker for a few weeks but now is walking independently again.  She has trouble on stairs.   She has less muscle spasms and takes baclofen .   She denies arm weakness but legs sometimes feel weak.  She has dysesthesias.   She takes Lyrica  100 mg po tid.   She denies much clumsiness.     She has urinary frequency and is on 15 mg oxybutynin  and Myrbetriq .  She saw urology and a nerve stimulator was discussed.   She had notice that vision worsening. She had cataract surgery in one eye 5/15 and  vision is better,   She will have the right eye operation tomorrow  She has fatigue.   She has OSA and no longer uses CPAP  She prefers not to use CPAP.  She did not feel any better with CPAP.  She wakes up frequently to use the bathroom.      She reports mild short term memory issues.    She denies depression now but had some in 2020 with Covid.     She has essential hypertension and type 2 NIDDM.     She takes Vit D 50000 weekly.  She was once reportedly very low but level was 103 in 2022.  Will recheck today  She has hip DJD and saw Ortho and will be getting hip surgery.    There has been delay as medical clearance took a while to get an appt.     In 2023, while in [place], she had an episode of slurred speech and weakness x a couple hours and her daughter thought her  behavior was erratic x 2 days. She went to the ED had MRI of the cervical spine and brain.  Cervical spine was read as being normal and there is no spinal stenosis.  The brain MRI showed no acute findings and stable T2 hyperintense foci consistent with MS   Sometimes if tired, she slurs speech.   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   She needed to rest much more between tasks.   Over the previous 2 years, she also had issues with short term memory and tingling in her limbs.   She had MRI's in 2004 that were concerning for MS.  She was diagnosed in [place] at the [facility].    She never had an LP.   She was started on Rebif  and has tolerated it well.  She has had no major exacerbations.  However,  she reports a few exacerbations with leg and hand tingling or more pain in the legs.    She had her most recent 'exacerbation'  08/31/2021 and went to the ED. at the time, she was reporting itching and tingling throughout her  body that started on 08/30/2021.  In the emergency room, she was given prednisone  and oxycodone  and she felt better a few hours later with resolution of symptoms back to her baseline.  She had no focal deficits in the emergency room.  Liver function test showed mild elevated transaminases.  CBC and UA were fine.  Of note, a review of imaging reports performed at [facility] between 2016 and 2019 showed nonspecific changes in the brain described as being more consistent with chronic microvascular ischemic change.  Although the MRI of the cervical spine from 2022 reportedly found patchy areas of abnormal signal within the cord, more conspicuous than prior examinations, MRI from 2019 and 2017 showed a normal spinal cord. She was hospitalized with Covid-19 in July 2020 for 1 week.  She needed O2 but was not intubated.   She has a niece with MS.  Imaging  MRI cervical spine  03/27/2022 showed a normal spinal cord and mild DJD     MRI brain 03/27/2022 showed no acute findings.   She had stable T2 hyperintense foci.    MRI cervical spine 04/15/2021 showed 1 T2 hyperintense focus centrally towards the left adjacent to C3.  Some degenerative changes are noted but no significant spinal stenosis or nerve root compression.  MRI of the head 04/13/2021 showed  multiple T2/FLAIR hyperintense foci predominantly in the deep white matter but also in the periventricular and subcortical white matter.  Confluent foci are noted in the periatrial white matter as well as in the pons.  Some of the foci could be consistent with demyelination though the vast majority are more consistent with chronic microvascular ischemic change  MRI of the lumbar spine 09/03/2018 showed "Multilevel degenerative disc disease and facet arthropathy. Grade 1 lumbar canal stenosis at L3-4   Bilateral grade 1 foraminal stenosis at L4-5"  MRI of the brain 09/02/2018 showed "Mild to moderate periventricular and subcortical white matter T2/FLAIR hyperintensities are likely related to chronic small vessel ischemic disease.  There is no restricted diffusion to suggest acute infarct.  No hemorrhagic products "  MRI of the cervical spine 05/01/2018 showed "No focal abnormal cord signal or enhancement.  Mild degenerative changes, most pronounced at C4-C5 with facet arthropathy resulting in suspected bilateral neural foraminal narrowing. "  MRI of the thoracic spine 05/01/2018 showed "No definite abnormal spinal cord signal or enhancement.  Mild epidural lipomatosis, unchanged.  "  MRI of the brain 05/01/2018 showed "Unchanged moderate MS plaque burden when compared to 09/04/2015. No evidence for active demyelination.  Suspect superimposed chronic small vessel ischemic changes. "  MRI of the cervical spine 09/02/2016 showed "In the pons there is a fairly extensive signal abnormality redemonstrated and unchanged from the prior MRI C-spine  on 04/08/2014.   The cervical spinal cord demonstrates normal signal morphology without any abnormal areas of enhancement. "  REVIEW OF SYSTEMS: Constitutional: No fevers, chills, sweats, or change in appetite.  She has fatigue. Eyes: No visual changes, double vision, eye pain Ear, nose and throat: No hearing loss, ear pain, nasal congestion, sore throat Cardiovascular: No chest pain, palpitations Respiratory:  No shortness of breath at rest or with exertion.   No wheezes.  She has obstructive sleep apnea and used to use CPAP but no longer does so. GastrointestinaI: No nausea, vomiting, diarrhea, abdominal pain, fecal incontinence Genitourinary:  No dysuria, urinary retention or frequency.  No nocturia. Musculoskeletal:  No neck pain, back pain Integumentary: No rash, pruritus, skin lesions Neurological: as above  Psychiatric: No depression at this time.  No anxiety Endocrine: No palpitations, diaphoresis, change in appetite, change in weigh or increased thirst Hematologic/Lymphatic:  No anemia, purpura, petechiae. Allergic/Immunologic: No itchy/runny eyes, nasal congestion, recent allergic reactions, rashes  ALLERGIES: Allergies  Allergen Reactions   Compazine [Prochlorperazine]     Muscle pain   Lisinopril Swelling    HOME MEDICATIONS:  Current Outpatient Medications:    aspirin  81 MG chewable tablet, Chew 1 tablet (81 mg total) by mouth 2 (two) times daily., Disp: 30 tablet, Rfl: 0   atorvastatin  (LIPITOR ) 80 MG tablet, Take 80 mg by mouth daily., Disp: , Rfl:    chlorthalidone  (HYGROTON ) 25 MG tablet, Take 25 mg by mouth daily., Disp: , Rfl:    ergocalciferol (VITAMIN D2) 1.25 MG (50000 UT) capsule, Take 50,000 Units by mouth every Friday., Disp: , Rfl:    Interferon Beta-1a  (REBIF  REBIDOSE) 44 MCG/0.5ML SOAJ, Inject 0.5 mLs into the skin 3 (three) times a week. Must keep follow up 05/14/2024 for ongoing refills, Disp: 6 mL, Rfl: 3   JARDIANCE  25 MG TABS tablet, Take 25 mg by  mouth daily., Disp: , Rfl:    levocetirizine (XYZAL) 5 MG tablet, Take 5 mg by mouth every evening., Disp: , Rfl:    metFORMIN  (GLUCOPHAGE ) 1000 MG tablet, Take 1,000 mg by mouth 2 (two) times daily with a meal., Disp: , Rfl:    mirabegron  ER (MYRBETRIQ ) 50 MG TB24 tablet, Take 1 tablet (50 mg total) by mouth daily., Disp: 90 tablet, Rfl: 1   oxybutynin  (DITROPAN  XL) 15 MG 24 hr tablet, Take 15 mg by mouth at bedtime., Disp: , Rfl:    potassium chloride  SA (KLOR-CON  M) 20 MEQ tablet, Take 1 tablet (20 mEq total) by mouth 2 (two) times daily., Disp: 30 tablet, Rfl: 0   pregabalin  (LYRICA ) 100 MG capsule, Take 100 mg by mouth 3 (three) times daily., Disp: , Rfl:    baclofen  (LIORESAL ) 10 MG tablet, Take 10 mg by mouth 3 (three) times daily., Disp: , Rfl:    diclofenac (VOLTAREN) 75 MG EC tablet, Take 75 mg by mouth 2 (two) times daily. (Patient not taking: Reported on 05/24/2023), Disp: , Rfl:    Metoprolol  Tartrate 37.5 MG TABS, Take 37.5 mg by mouth 2 (two) times daily. (Patient not taking: Reported on 05/08/2024), Disp: , Rfl:   PAST MEDICAL HISTORY: Past Medical History:  Diagnosis Date   Arthritis    Asthma    Cancer (HCC)    "cancerous cells found on pap smear- removed surgically"   Diabetes mellitus without complication (HCC)    Fibromyalgia    Hypertension    MS (multiple sclerosis) (HCC)    Sleep apnea     PAST SURGICAL HISTORY: Past Surgical History:  Procedure Laterality Date   APPENDECTOMY     EYE SURGERY Bilateral 2023   "repaired torn retina"   KNEE SURGERY     TOTAL HIP ARTHROPLASTY Right 12/28/2022   Procedure: RIGHT TOTAL HIP ARTHROPLASTY ANTERIOR APPROACH;  Surgeon: Arnie Lao, MD;  Location: [facility] OR;  Service: Orthopedics;  Laterality: Right;   VAGINA SURGERY     "pt states the doctor surgically removed cancerous cells from vaginal area"    FAMILY HISTORY: Family History  Problem Relation Age of Onset   Alopecia Mother    Cancer Mother    Cancer  Father    Diabetes Father    Cancer Sister    Multiple sclerosis Cousin     SOCIAL HISTORY:  Social History   Socioeconomic History   Marital status: Divorced    Spouse name: Not on file   Number of children: 2   Years of education: Not on file   Highest education level: High school graduate  Occupational History   Not on file  Tobacco Use   Smoking status: Never   Smokeless tobacco: Never  Vaping Use   Vaping status: Never Used  Substance and Sexual Activity   Alcohol use: Not Currently   Drug use: Never   Sexual activity: Not on file  Other Topics Concern   Not on file  Social History Narrative   Lives w daughter   Right handed   Caffeine: occa drinks tea   Social Drivers of Corporate investment banker Strain: Not on file  Food Insecurity: Unknown (01/16/2023)   Received from [employer] and [employer], [employer] and [employer] Insecurities    Worried about running out of food: Not on [employer] Bought: Not on file  Transportation Needs: Unknown (01/16/2023)   Received from [employer] and [employer], [employer] and [employer]    Worried about transportation: Not on file  Physical Activity: Not on file  Stress: Not on file  Social Connections: Unknown (06/23/2023)   Received from [employer], [facility]   Social Network    Social Network: Not on file  Intimate Partner Violence: Unknown (06/23/2023)   Received from [facility], [facility]   HITS    Physically Hurt: Not on file    Insult or Talk Down To: Not on file    Threaten Physical Harm: Not on file    Scream or Curse: Not on file     PHYSICAL EXAM  Vitals:   05/08/24 0953  BP: 112/70  Pulse: 92  Weight: 200 lb 8 oz (90.9 kg)  Height: 5\' 4"  (1.626 m)     Body mass index is 34.42 kg/m.   General: The patient is well-developed and well-nourished and in no acute distress  HEENT:  Head is Delta/AT.   Sclera are anicteric.   Skin: Extremities are without rash   She has mild ankle edema, right > left  Neurologic Exam  Mental status: The patient is alert and oriented x 3 at the time of the examination. The patient has apparent normal recent and remote memory, with an apparently normal attention span and concentration ability.   Speech is normal.  Cranial nerves: Extraocular movements are full.  Facial strength and sensation is normal.. No obvious hearing deficits are noted.  Motor:  Muscle bulk is normal.   Tone is normal. Strength is  5 / 5 in all 4 extremities.   Sensory: Sensory testing is intact to soft touch and vibration sensation in all 4 extremities.  Coordination: Cerebellar testing reveals good finger-nose-finger and heel-to-shin bilaterally.  Gait and station: Station is normal.   Her gait is arthritic and mildly wide.  Tandem walk is very wide. .  Romberg is negative.    Reflexes: Deep tendon reflexes are symmetric and normal in arms, trace at knees and absent in ankles.  Aaron Aas      DIAGNOSTIC DATA (LABS, IMAGING, TESTING) - I reviewed patient records, labs, notes, testing and imaging myself where available.  Lab Results  Component Value Date   WBC 9.4 01/02/2023   HGB 12.5 01/02/2023   HCT 37.3 01/02/2023   MCV 87.8 01/02/2023   PLT 255 01/02/2023  Component Value Date/Time   NA 136 01/02/2023 0252   K 3.7 01/02/2023 0252   CL 99 01/02/2023 0252   CO2 26 01/02/2023 0252   GLUCOSE 132 (H) 01/02/2023 0252   BUN 22 01/02/2023 0252   CREATININE 0.87 01/02/2023 0252   CALCIUM  9.3 01/02/2023 0252   PROT 7.4 01/02/2023 0252   ALBUMIN 2.9 (L) 01/02/2023 0252   AST 33 01/02/2023 0252   ALT 27 01/02/2023 0252   ALKPHOS 56 01/02/2023 0252   BILITOT 0.4 01/02/2023 0252   GFRNONAA >60 01/02/2023 0252       ASSESSMENT AND PLAN  Multiple sclerosis (HCC) - Plan: CBC with Differential/Platelet, Comprehensive metabolic panel with GFR  High risk medication use  - Plan: CBC with Differential/Platelet, Comprehensive metabolic panel with GFR  Vitamin D deficiency - Plan: VITAMIN D 25 Hydroxy (Vit-D Deficiency, Fractures)  Numbness  Urinary dysfunction  Other fatigue  Diabetes mellitus without complication (HCC)  Hypertension, unspecified type   She will continue Rebif .  We discussed the association between age and MS activity.   She is not interested in stopping the medication as she is stable Vision better after cataract surgery.  Gait better after hip surgery The active and exercise as tolerated. Return in 6 months or sooner if new or worsening symptoms.  This visit is part of a comprehensive longitudinal care medical relationship regarding the patients primary diagnosis of MS and related concerns.   Markiah Janeway A. Godwin Lat, MD, [facility] 05/08/2024, 10:30 AM Certified in Neurology, Clinical Neurophysiology, Sleep Medicine and Neuroimaging  [facility] [address] [place], [place] [address] [phone]

## 2024-05-09 ENCOUNTER — Ambulatory Visit: Payer: Self-pay | Admitting: Neurology

## 2024-05-09 LAB — CBC WITH DIFFERENTIAL/PLATELET
Basophils Absolute: 0 10*3/uL (ref 0.0–0.2)
Basos: 0 %
EOS (ABSOLUTE): 0.1 10*3/uL (ref 0.0–0.4)
Eos: 1 %
Hematocrit: 46.2 % (ref 34.0–46.6)
Hemoglobin: 14.9 g/dL (ref 11.1–15.9)
Immature Grans (Abs): 0 10*3/uL (ref 0.0–0.1)
Immature Granulocytes: 0 %
Lymphocytes Absolute: 3.2 10*3/uL — ABNORMAL HIGH (ref 0.7–3.1)
Lymphs: 32 %
MCH: 29.5 pg (ref 26.6–33.0)
MCHC: 32.3 g/dL (ref 31.5–35.7)
MCV: 92 fL (ref 79–97)
Monocytes Absolute: 0.7 10*3/uL (ref 0.1–0.9)
Monocytes: 7 %
Neutrophils Absolute: 5.8 10*3/uL (ref 1.4–7.0)
Neutrophils: 60 %
Platelets: 239 10*3/uL (ref 150–450)
RBC: 5.05 x10E6/uL (ref 3.77–5.28)
RDW: 14.4 % (ref 11.7–15.4)
WBC: 10 10*3/uL (ref 3.4–10.8)

## 2024-05-09 LAB — COMPREHENSIVE METABOLIC PANEL WITH GFR
ALT: 21 IU/L (ref 0–32)
AST: 22 IU/L (ref 0–40)
Albumin: 4.3 g/dL (ref 3.9–4.9)
Alkaline Phosphatase: 102 IU/L (ref 44–121)
BUN/Creatinine Ratio: 26 (ref 12–28)
BUN: 22 mg/dL (ref 8–27)
Bilirubin Total: 0.3 mg/dL (ref 0.0–1.2)
CO2: 28 mmol/L (ref 20–29)
Calcium: 10.1 mg/dL (ref 8.7–10.3)
Chloride: 95 mmol/L — ABNORMAL LOW (ref 96–106)
Creatinine, Ser: 0.85 mg/dL (ref 0.57–1.00)
Globulin, Total: 3.7 g/dL (ref 1.5–4.5)
Glucose: 113 mg/dL — ABNORMAL HIGH (ref 70–99)
Potassium: 3.8 mmol/L (ref 3.5–5.2)
Sodium: 140 mmol/L (ref 134–144)
Total Protein: 8 g/dL (ref 6.0–8.5)
eGFR: 75 mL/min/{1.73_m2} (ref >59)

## 2024-05-09 LAB — VITAMIN D 25 HYDROXY (VIT D DEFICIENCY, FRACTURES): Vit D, 25-Hydroxy: 104 ng/mL — ABNORMAL HIGH (ref 30.0–100.0)

## 2024-05-14 ENCOUNTER — Ambulatory Visit: Payer: Medicare PPO | Admitting: Neurology

## 2024-05-31 ENCOUNTER — Other Ambulatory Visit: Payer: Self-pay | Admitting: Neurology

## 2024-05-31 NOTE — Telephone Encounter (Signed)
 Last seen on 05/08/24 Follow up scheduled on 11/13/24

## 2024-06-11 ENCOUNTER — Other Ambulatory Visit (HOSPITAL_BASED_OUTPATIENT_CLINIC_OR_DEPARTMENT_OTHER): Payer: Self-pay | Admitting: Family Medicine

## 2024-06-11 DIAGNOSIS — Z13820 Encounter for screening for osteoporosis: Secondary | ICD-10-CM

## 2024-07-09 ENCOUNTER — Telehealth: Payer: Self-pay | Admitting: Neurology

## 2024-07-09 ENCOUNTER — Other Ambulatory Visit (HOSPITAL_COMMUNITY): Payer: Self-pay

## 2024-07-09 ENCOUNTER — Telehealth: Payer: Self-pay

## 2024-07-09 ENCOUNTER — Other Ambulatory Visit: Payer: Self-pay

## 2024-07-09 DIAGNOSIS — G35 Multiple sclerosis: Secondary | ICD-10-CM

## 2024-07-09 MED ORDER — REBIF REBIDOSE 44 MCG/0.5ML ~~LOC~~ SOAJ: 0.5000 mL | SUBCUTANEOUS | 3 refills | Status: DC

## 2024-07-09 NOTE — Telephone Encounter (Signed)
 Please do PA on pt's Interferon Beta-1a  9Rebif Rebidose) 44MCH/0.5ML SOAJ

## 2024-07-09 NOTE — Telephone Encounter (Signed)
 Patient request refill for Interferon Beta-1a  (REBIF  REBIDOSE) 44 MCG/0.5ML SOAJ send to [facility]

## 2024-07-09 NOTE — Telephone Encounter (Signed)
 Pharmacy Patient Advocate Encounter   Received notification from Physician's Office that prior authorization for Rebif  Rebidose 44MCG/0.5ML auto-injectors is required/requested.   Insurance verification completed.   The patient is insured through [employer] .   Per test claim: The current 28 day co-pay is, $0.  No PA needed at this time. This test claim was processed through [facility]- copay amounts may vary at other pharmacies due to pharmacy/plan contracts, or as the patient moves through the different stages of their insurance plan.

## 2024-11-07 NOTE — Telephone Encounter (Signed)
 Pt calling wants to talk with our office about why she is taking 3 or 4 different blood pressure medications. Patient says you can not reach her by her phone and would not provide me with any other options/ people to speak with. I sent her to my chart help to reactivate her account so we can talk with her via mcm. Please message pt to discuss

## 2024-11-12 ENCOUNTER — Other Ambulatory Visit (HOSPITAL_COMMUNITY): Payer: Self-pay

## 2024-11-12 ENCOUNTER — Telehealth (HOSPITAL_COMMUNITY): Payer: Self-pay | Admitting: Adult Health

## 2024-11-12 ENCOUNTER — Encounter: Payer: Self-pay | Admitting: Cardiology

## 2024-11-12 ENCOUNTER — Ambulatory Visit: Attending: Cardiology | Admitting: Cardiology

## 2024-11-12 VITALS — BP 107/73 | HR 127 | Ht 64.0 in | Wt 187.9 lb

## 2024-11-12 DIAGNOSIS — I5023 Acute on chronic systolic (congestive) heart failure: Secondary | ICD-10-CM | POA: Diagnosis not present

## 2024-11-12 DIAGNOSIS — R931 Abnormal findings on diagnostic imaging of heart and coronary circulation: Secondary | ICD-10-CM | POA: Insufficient documentation

## 2024-11-12 DIAGNOSIS — E782 Mixed hyperlipidemia: Secondary | ICD-10-CM | POA: Diagnosis not present

## 2024-11-12 DIAGNOSIS — Z950 Presence of cardiac pacemaker: Secondary | ICD-10-CM | POA: Insufficient documentation

## 2024-11-12 DIAGNOSIS — I10 Essential (primary) hypertension: Secondary | ICD-10-CM | POA: Diagnosis not present

## 2024-11-12 MED ORDER — METOPROLOL SUCCINATE ER 25 MG PO TB24
25.0000 mg | ORAL_TABLET | Freq: Every day | ORAL | 3 refills | Status: DC
  Filled 2024-11-12: qty 90, 90d supply, fill #0

## 2024-11-12 MED ORDER — SPIRONOLACTONE 25 MG PO TABS
12.5000 mg | ORAL_TABLET | Freq: Every day | ORAL | 2 refills | Status: DC
  Filled 2024-11-12: qty 15, 30d supply, fill #0

## 2024-11-12 MED ORDER — TORSEMIDE 20 MG PO TABS: 20.0000 mg | ORAL_TABLET | ORAL | Status: DC | PRN

## 2024-11-12 MED ORDER — EMPAGLIFLOZIN 10 MG PO TABS
10.0000 mg | ORAL_TABLET | Freq: Every day | ORAL | 3 refills | Status: DC
  Filled 2024-11-12: qty 90, 90d supply, fill #0

## 2024-11-12 MED ORDER — LOSARTAN POTASSIUM 25 MG PO TABS
12.5000 mg | ORAL_TABLET | Freq: Every day | ORAL | 2 refills | Status: DC
  Filled 2024-11-12: qty 30, 60d supply, fill #0

## 2024-11-12 NOTE — Progress Notes (Signed)
 Cardiology Office Note:  .   Date:  11/12/2024  ID:  Alisha Martinez, DOB 1956-01-22, MRN [id number] PCP: Patient, No Pcp Per   HeartCare Providers Cardiologist:  [name], MD PCP: Patient, No Pcp Per  Chief Complaint  Patient presents with   HFrEF     Alisha Martinez is a 68 y.o. female with hypertension, hyperlipidemia, type 2 DM, nonischemic cardiomyopathy, s/p CRT-D  Discussed the use of AI scribe software for clinical note transcription with the patient, who gave verbal consent to proceed.  History of Present Illness  Patient is originally from [place], [place], has been living with her daughter here in town. Patient was last seen by Dr. Alvan in 02/2024.  Previous workup that showed LVEF mildly reduced at 40-45%, and stress test raising suspicion of possible old infarct versus apical thinning.  Patient was recently hospitalized at [facility] in [place], [place] in 09/2024 with worsening symptoms of heart failure and concern for early cardiogenic shock/low output heart failure.  She was initially started on inotrope support, but did not tolerate this due to tachyarrhythmia, therefore inotropes were discontinued.  She underwent CRT placement with the hopes that would lead to long-term reverse remodeling and overall improvement in her heart failure status.  Her acute hemodynamics did improve with CRT and slow initiation/reinitiation of GDMT.  She tolerated p.o.'s well.  At conclusion of her hospital stay, she was doing well hemodynamically, well tolerating partial GDMT and maintaining volume status.  Since her discharge, patient has continued to feel fatigued and tired until only a few days ago.  She was discharged home on Entresto 24-26 mg twice daily, spironolactone 12.5 mg daily, torsemide 20 mg daily, metoprolol  succinate 25 mg daily, Jardiance  10 mg daily for GDMT of HFrEF.  Of these, patient has ran out of Entresto, spironolactone for at least 3-4 days, has been taking  torsemide 20 mg every other day, has been taking metoprolol  and Jardiance  daily.  She still has exertional dyspnea, has mild leg edema.  She requires a lot of assistance at home with medications etc., as her daughter is working during the day.    Vitals:   11/12/24 1115  BP: 107/73  Pulse: (!) 127  SpO2: 96%      Review of Systems  Constitutional: Positive for malaise/fatigue.  Cardiovascular:  Positive for dyspnea on exertion and leg swelling. Negative for chest pain, palpitations and syncope.        Studies Reviewed: SABRA        EKG 11/12/2024: Ventricular-paced rhythm When compared with ECG of 29-Dec-2022 12:56, Electronic ventricular pacemaker has replaced Sinus rhythm    RHC/LHC 09/24/2024:  Mildly elevated bilateral pressures (RA 8, PCWP 18), and low cardiac output (CI 1.52 by Fick). Non obstructive CAD.   Echocardiogram 09/22/2024: ECHO obtained 9/27 demonstrating acute on chronic HFrEF with EF 23%, G2DD, and no significant valvular abnormalities.   Independently interpreted 09/2024: Hb 14.3 Cr 1.06, eGFR 58, K 4.1 ProBNP 349   Labs 04/2024: Hb 14.9 Cr 0.85  Echocardiogram 12/2022:  1. Akinesis of the anteroseptal/anterior/inferoseptal wall from base to  apex. Left ventricular ejection fraction, by estimation, is 40 to 45%. The  left ventricle has mildly decreased function. The left ventricle demonstrates  global hypokinesis. Left ventricular diastolic parameters are indeterminate.   2. Right ventricular systolic function is normal. The right ventricular  size is normal. Tricuspid regurgitation signal is inadequate for assessing  PA pressure.   3. No evidence of mitral valve regurgitation.  4. The aortic valve is grossly normal. Aortic valve regurgitation is not  visualized.   5. The inferior vena cava is normal in size with greater than 50%  respiratory variability, suggesting right atrial pressure of 3 mmHg.   Comparison(s): No prior Echocardiogram.    Conclusion(s)/Recommendation(s): Has wall motion abnormality, consider  ischemic evaluation.   Stress test 01/2023:   A pharmacological stress test was performed using IV Lexiscan  0.4mg  over 10 seconds performed without concurrent submaximal exercise.  This patient had abdominal pain and SOB with the Lexiscan .   No ST deviation was noted from baseline. Left bundle branch block was seen. ECG was uninterpretable due to left bundle branch block. The ECG was not diagnostic due to pharmacologic protocol.   Breast attenuation artifact was present.   LV perfusion is abnormal. There is no evidence of ischemia. There is evidence of infarction. Defect 1: There is a small defect with moderate reduction in uptake present in the apical apex location(s) that is fixed. There is abnormal wall motion in the defect area. Consistent with infarction vs. breast attenuation artifact.   Left ventricular function is normal. Nuclear stress EF: 28 %. The left ventricular ejection fraction is severely decreased (<30%). End diastolic cavity size is normal. End systolic cavity size is normal. No evidence of transient ischemic dilation (TID) noted.   The study is normal. The study is high risk due to severe LV dysfunction.  11/2022: HbA1C 7.2%   Physical Exam Vitals and nursing note reviewed.  Constitutional:      General: She is not in acute distress. Neck:     Vascular: No JVD.  Cardiovascular:     Rate and Rhythm: Normal rate and regular rhythm.     Heart sounds: Normal heart sounds. No murmur heard. Pulmonary:     Effort: Pulmonary effort is normal.     Breath sounds: Normal breath sounds. No wheezing or rales.  Musculoskeletal:     Right lower leg: Edema (1+) present.     Left lower leg: Edema (1+) present.      VISIT DIAGNOSES:   ICD-10-CM   1. Abnormal echocardiogram  R93.1 EKG 12-Lead    2. Acute on chronic systolic heart failure (HCC)  P49.76 EKG 12-Lead    AMB referral to [facility]     Ambulatory referral to Home Health    AMB Referral to [facility]    AMB Referral to Nutrition    Comprehensive metabolic panel with GFR    Pro b natriuretic peptide (BNP)    ECHOCARDIOGRAM COMPLETE    Basic metabolic panel with GFR    Pro b natriuretic peptide (BNP)    3. Primary hypertension  I10 EKG 12-Lead    Comprehensive metabolic panel with GFR    4. Mixed hyperlipidemia  E78.2 Lipid panel    5. S/P biventricular cardiac pacemaker procedure  Z95.0 Ambulatory referral to Cardiac Electrophysiology    6. Pacemaker  Z95.0 Ambulatory referral to Cardiac Electrophysiology       Ladeja Pelham is a 68 y.o. female with hypertension, hyperlipidemia, type 2 DM, nonischemic cardiomyopathy, s/p CRT-D  Assessment & Plan  Acute on chronic systolic heart failure: Nonischemic cardiomyopathy, NYHA class III symptoms. S/p BiV ICD placement in [place], [place] during recent decompensation requiring hospitalization in [place], [place] in 09/2024. Clinically appears volume overloaded with 1+ bilateral edema today. Resting heart rate in 80s as opposed to initial resting heart rate of 127 on EKG She has been out of certain medications for  at least the last 3 to 4 days. Check CMP, proBNP today. Resume GDMT.  I would start her back on losartan 12.5 mg daily, with intention of transitioning to Entresto in future after ensuring tolerance. Also resume spironolactone at 12.5 mg daily. With this, I recommend her to take torsemide only as needed for leg edema, and not on a scheduled basis. Continue metoprolol  succinate 25 mg daily, Jardiance  10 mg daily, refill sent. Recheck BMP, proBNP in 7-10 days. Discussed heart failure diet with sodium restriction. Refer to heart failure clinic and EP clinic. At patient's request, also referral to nutritionist and home health clinic. Note that I do not know the extent of her nonobstructive CAD, reasonable to continue aspirin  81 mg daily.  Mixed  hyperlipidemia: Continue Crestor.   Meds ordered this encounter  Medications   spironolactone (ALDACTONE) 25 MG tablet    Sig: Take 1/2 tablet (12.5 mg total) by mouth daily.    Dispense:  15 tablet    Refill:  2   torsemide (DEMADEX) 20 MG tablet    Sig: Take 1 tablet (20 mg total) by mouth as needed.   losartan (COZAAR) 25 MG tablet    Sig: Take 1/2 tablets (12.5 mg total) by mouth daily.    Dispense:  30 tablet    Refill:  2   empagliflozin  (JARDIANCE ) 10 MG TABS tablet    Sig: Take 1 tablet (10 mg total) by mouth daily.    Dispense:  90 tablet    Refill:  3   metoprolol  succinate (TOPROL -XL) 25 MG 24 hr tablet    Sig: Take 1 tablet (25 mg total) by mouth daily.    Dispense:  90 tablet    Refill:  3     F/u in 6-8 weeks  I spent 50 minutes in the care of Alisha Martinez today including reviewing records regarding recent heart failure hospitalization, reviewing medications and making adjustments, placing referrals to heart failure, EP, home health, nutritionist,  and documenting in the encounter.   Signed, Newman JINNY Lawrence, MD

## 2024-11-12 NOTE — Patient Instructions (Addendum)
 Medication Instructions:  CHANGE Torsemide to as needed daily   Refilled Jardiance  10 mg  Refilled Metoprolol  25 mg   START Spironolactone 12.5 mg daily  START Losartan 12.5 mg daily   STOP Entresto  *If you need a refill on your cardiac medications before your next appointment, please call your pharmacy*  Lab Work: CMP- TODAY LIPID PANEL - TODAY PROBNP- TODAY   BMP - IN 7 TO 10 DAYS  PROBNP- IN 7 TO 10 DAYS   If you have labs (blood work) drawn today and your tests are completely normal, you will receive your results only by: MyChart Message (if you have MyChart) OR A paper copy in the mail If you have any lab test that is abnormal or we need to change your treatment, we will call you to review the results.  Testing/Procedures: ECHOCARDIOGRAM  Your physician has requested that you have an echocardiogram. Echocardiography is a painless test that uses sound waves to create images of your heart. It provides your doctor with information about the size and shape of your heart and how well your heart's chambers and valves are working. This procedure takes approximately one hour. There are no restrictions for this procedure. Please do NOT wear cologne, perfume, aftershave, or lotions (deodorant is allowed). Please arrive 15 minutes prior to your appointment time.  Please note: We ask at that you not bring children with you during ultrasound (echo/ vascular) testing. Due to room size and safety concerns, children are not allowed in the ultrasound rooms during exams. Our front office staff cannot provide observation of children in our lobby area while testing is being conducted. An adult accompanying a patient to their appointment will only be allowed in the ultrasound room at the discretion of the ultrasound technician under special circumstances. We apologize for any inconvenience.   REFERRAL TO ELECTROPHYSIOLOGY   REFERRAL TO HEART FAILURE CLINIC (URGENT)  REFERRAL TO NUTRITION    REFERRAL TO PHARM-D (NEED APPOINTMENT IN 2-3 WEEKS)  REFERRAL FOR HOME HEALTH   Follow-Up: At [facility], you and your health needs are our priority.  As part of our continuing mission to provide you with exceptional heart care, our providers are all part of one team.  This team includes your primary Cardiologist (physician) and Advanced Practice Providers or APPs (Physician Assistants and Nurse Practitioners) who all work together to provide you with the care you need, when you need it.  Your next appointment:   6 week(s)  Provider:   Newman JINNY Lawrence, MD

## 2024-11-13 ENCOUNTER — Encounter: Payer: Self-pay | Admitting: Neurology

## 2024-11-13 ENCOUNTER — Ambulatory Visit: Payer: Self-pay | Admitting: Cardiology

## 2024-11-13 ENCOUNTER — Ambulatory Visit: Admitting: Neurology

## 2024-11-13 VITALS — BP 100/68 | HR 84 | Ht 64.0 in | Wt 187.0 lb

## 2024-11-13 DIAGNOSIS — I5023 Acute on chronic systolic (congestive) heart failure: Secondary | ICD-10-CM

## 2024-11-13 DIAGNOSIS — I10 Essential (primary) hypertension: Secondary | ICD-10-CM | POA: Diagnosis not present

## 2024-11-13 DIAGNOSIS — Z79899 Other long term (current) drug therapy: Secondary | ICD-10-CM | POA: Diagnosis not present

## 2024-11-13 DIAGNOSIS — R39198 Other difficulties with micturition: Secondary | ICD-10-CM | POA: Diagnosis not present

## 2024-11-13 DIAGNOSIS — G35C1 Active secondary progressive multiple sclerosis: Secondary | ICD-10-CM | POA: Diagnosis not present

## 2024-11-13 LAB — COMPREHENSIVE METABOLIC PANEL WITH GFR
ALT: 13 IU/L (ref 0–32)
AST: 19 IU/L (ref 0–40)
Albumin: 4.5 g/dL (ref 3.9–4.9)
Alkaline Phosphatase: 55 IU/L (ref 49–135)
BUN/Creatinine Ratio: 22 (ref 12–28)
BUN: 17 mg/dL (ref 8–27)
Bilirubin Total: 0.3 mg/dL (ref 0.0–1.2)
CO2: 23 mmol/L (ref 20–29)
Calcium: 9.9 mg/dL (ref 8.7–10.3)
Chloride: 102 mmol/L (ref 96–106)
Creatinine, Ser: 0.76 mg/dL (ref 0.57–1.00)
Globulin, Total: 2.9 g/dL (ref 1.5–4.5)
Glucose: 103 mg/dL — ABNORMAL HIGH (ref 70–99)
Potassium: 4.1 mmol/L (ref 3.5–5.2)
Sodium: 141 mmol/L (ref 134–144)
Total Protein: 7.4 g/dL (ref 6.0–8.5)
eGFR: 86 mL/min/1.73 (ref >59)

## 2024-11-13 LAB — LIPID PANEL
Chol/HDL Ratio: 2.8 ratio (ref 0.0–4.4)
Cholesterol, Total: 163 mg/dL (ref 100–199)
HDL: 59 mg/dL (ref >39)
LDL Chol Calc (NIH): 91 mg/dL (ref 0–99)
Triglycerides: 68 mg/dL (ref 0–149)
VLDL Cholesterol Cal: 13 mg/dL (ref 5–40)

## 2024-11-13 LAB — PRO B NATRIURETIC PEPTIDE: NT-Pro BNP: 1172 pg/mL — ABNORMAL HIGH (ref 0–301)

## 2024-11-13 MED ORDER — PREGABALIN 100 MG PO CAPS: 100.0000 mg | ORAL_CAPSULE | Freq: Two times a day (BID) | ORAL | 1 refills | Status: DC

## 2024-11-13 NOTE — Progress Notes (Signed)
 [facility]  PATIENT: Alisha Martinez DOB: [birth year]  REFERRING DOCTOR OR PCP: Aurora Molt, MD SOURCE: Patient, notes from [facility] neurology, imaging and lab reports  _________________________________   HISTORICAL  CHIEF COMPLAINT:  Chief Complaint  Patient presents with   Follow-up    Pt in room 11. Alone. Here for MS follow up.    HISTORY OF PRESENT ILLNESS:  Alisha Martinez is a 68 y.o. woman with MS.  UPDATE 11/13/2024 She has low EF% CHF and had a pacemaker defibrillator placed recently  She is on Rebif  and tolerates it well.SABRA    She walks about the same as earlier this year.  She uses a cane or walker.  Stairs can be difficult and slow.   She has no recent falls.     She has some leg weakness, bilateral and similar to earlier this year.   Arms are strong.     Dysesthesias and spasticity are doing better with Lyrica  and baclofen .   Due to heart issues, the baclofen  was discontined  She is on a diuretic.  She has urinary frequency and is on 15 mg oxybutynin  and Myrbetriq .   She had notice that vision worsening. She had cataract surgery last year.    She has dysesthesias.   She takes Lyrica  100 mg po tid.   She denies much clumsiness.      She has fatigue.   She has OSA and no longer uses CPAP.  She stopped the OSA as she did not feel good.  I advised her that she may want to restart due to her cardiac issues.  She wakes up frequently to use the bathroom.      She reports mild short term memory issues.    She denies depression nowd.      She takes Vit D supp's  TIA versus exacerbation?:  In 2023, while in [place], she had an episode of slurred speech and weakness x a couple hours and her daughter thought her behavior was erratic x 2 days. She went to the ED had MRI of the cervical spine and brain.  Cervical spine was read as being normal and there is no spinal stenosis.  The brain MRI showed no acute findings and stable T2 hyperintense  foci consistent with MS   Sometimes if tired, she slurs speech.   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   She needed to rest much more between tasks.   Over the previous 2 years, she also had issues with short term memory and tingling in her limbs.   She had MRI's in 2004 that were concerning for MS.  She was diagnosed in [place] at the [facility].    She never had an LP.   She was started on Rebif  and has tolerated it well.  She has had no major exacerbations.  However,  she reports a few exacerbations with leg and hand tingling or more pain in the legs.    She had her most recent 'exacerbation'  08/31/2021 and went to the ED. at the time, she was reporting itching and tingling throughout her body that started on 08/30/2021.  In the emergency room, she was given prednisone  and oxycodone  and she felt better a few hours later with resolution of symptoms back to her baseline.  She had no focal deficits in the emergency room.  Liver function test showed mild elevated  transaminases.  CBC and UA were fine.  Of note, a review of imaging reports performed at [facility] between 2016 and 2019 showed nonspecific changes in the brain described as being more consistent with chronic microvascular ischemic change.  Although the MRI of the cervical spine from 2022 reportedly found patchy areas of abnormal signal within the cord, more conspicuous than prior examinations, MRI from 2019 and 2017 showed a normal spinal cord.  She was hospitalized with Covid-19 in July 2020 for 1 week.  She needed O2 but was not intubated.   She has a niece with MS.  Imaging  MRI cervical spine 03/27/2022 showed a normal spinal cord and mild DJD     MRI brain 03/27/2022 showed no acute findings.   She had stable T2 hyperintense foci.    MRI cervical spine 04/15/2021 showed 1 T2 hyperintense focus centrally towards the left  adjacent to C3.  Some degenerative changes are noted but no significant spinal stenosis or nerve root compression.  MRI of the head 04/13/2021 showed  multiple T2/FLAIR hyperintense foci predominantly in the deep white matter but also in the periventricular and subcortical white matter.  Confluent foci are noted in the periatrial white matter as well as in the pons.  Some of the foci could be consistent with demyelination though the vast majority are more consistent with chronic microvascular ischemic change  MRI of the lumbar spine 09/03/2018 showed Multilevel degenerative disc disease and facet arthropathy. Grade 1 lumbar canal stenosis at L3-4   Bilateral grade 1 foraminal stenosis at L4-5  MRI of the brain 09/02/2018 showed Mild to moderate periventricular and subcortical white matter T2/FLAIR hyperintensities are likely related to chronic small vessel ischemic disease.  There is no restricted diffusion to suggest acute infarct.  No hemorrhagic products   MRI of the cervical spine 05/01/2018 showed No focal abnormal cord signal or enhancement.  Mild degenerative changes, most pronounced at C4-C5 with facet arthropathy resulting in suspected bilateral neural foraminal narrowing.   MRI of the thoracic spine 05/01/2018 showed No definite abnormal spinal cord signal or enhancement.  Mild epidural lipomatosis, unchanged.    MRI of the brain 05/01/2018 showed Unchanged moderate MS plaque burden when compared to 09/04/2015. No evidence for active demyelination.  Suspect superimposed chronic small vessel ischemic changes.   MRI of the cervical spine 09/02/2016 showed In the pons there is a fairly extensive signal abnormality redemonstrated and unchanged from the prior MRI C-spine on 04/08/2014.   The cervical spinal cord demonstrates normal signal morphology without any abnormal areas of enhancement.   REVIEW OF SYSTEMS: Constitutional: No fevers, chills, sweats, or change in appetite.  She has  fatigue. Eyes: No visual changes, double vision, eye pain Ear, nose and throat: No hearing loss, ear pain, nasal congestion, sore throat Cardiovascular: No chest pain, palpitations Respiratory:  No shortness of breath at rest or with exertion.   No wheezes.  She has obstructive sleep apnea and used to use CPAP but no longer does so. GastrointestinaI: No nausea, vomiting, diarrhea, abdominal pain, fecal incontinence Genitourinary:  No dysuria, urinary retention or frequency.  No nocturia. Musculoskeletal:  No neck pain, back pain Integumentary: No rash, pruritus, skin lesions Neurological: as above Psychiatric: No depression at this time.  No anxiety Endocrine: No palpitations, diaphoresis, change in appetite, change in weigh or increased thirst Hematologic/Lymphatic:  No anemia, purpura, petechiae. Allergic/Immunologic: No itchy/runny eyes, nasal congestion, recent allergic reactions, rashes  ALLERGIES: Allergies  Allergen Reactions   Compazine [Prochlorperazine]  Muscle pain   Lisinopril Swelling    HOME MEDICATIONS:  Current Outpatient Medications:    aspirin  81 MG chewable tablet, Chew 1 tablet (81 mg total) by mouth 2 (two) times daily., Disp: 30 tablet, Rfl: 0   empagliflozin  (JARDIANCE ) 10 MG TABS tablet, Take 1 tablet (10 mg total) by mouth daily., Disp: 90 tablet, Rfl: 3   ergocalciferol  (VITAMIN D2) 1.25 MG (50000 UT) capsule, Take 50,000 Units by mouth every Friday., Disp: , Rfl:    Interferon Beta-1a  (REBIF  REBIDOSE) 44 MCG/0.5ML SOAJ, Inject 0.5 mLs into the skin 3 (three) times a week. Must keep follow up 05/14/2024 for ongoing refills, Disp: 6 mL, Rfl: 3   levocetirizine (XYZAL) 5 MG tablet, Take 5 mg by mouth every evening., Disp: , Rfl:    losartan (COZAAR) 25 MG tablet, Take 1/2 tablets (12.5 mg total) by mouth daily., Disp: 30 tablet, Rfl: 2   metFORMIN  (GLUCOPHAGE ) 1000 MG tablet, Take 1,000 mg by mouth 2 (two) times daily with a meal., Disp: , Rfl:     metoprolol  succinate (TOPROL -XL) 25 MG 24 hr tablet, Take 1 tablet (25 mg total) by mouth daily., Disp: 90 tablet, Rfl: 3   mirabegron  ER (MYRBETRIQ ) 50 MG TB24 tablet, TAKE 1 TABLET(50 MG) BY MOUTH DAILY, Disp: 30 tablet, Rfl: 4   oxybutynin  (DITROPAN  XL) 15 MG 24 hr tablet, Take 15 mg by mouth at bedtime., Disp: , Rfl:    rosuvastatin (CRESTOR) 10 MG tablet, Take 10 mg by mouth daily., Disp: , Rfl:    spironolactone (ALDACTONE) 25 MG tablet, Take 1/2 tablet (12.5 mg total) by mouth daily., Disp: 15 tablet, Rfl: 2   torsemide (DEMADEX) 20 MG tablet, Take 1 tablet (20 mg total) by mouth as needed., Disp: , Rfl:    chlorthalidone  (HYGROTON ) 25 MG tablet, Take 25 mg by mouth daily. (Patient not taking: Reported on 11/13/2024), Disp: , Rfl:    potassium chloride  SA (KLOR-CON  M) 20 MEQ tablet, Take 1 tablet (20 mEq total) by mouth 2 (two) times daily. (Patient not taking: Reported on 11/13/2024), Disp: 30 tablet, Rfl: 0   pregabalin  (LYRICA ) 100 MG capsule, Take 1 capsule (100 mg total) by mouth 2 (two) times daily., Disp: 180 capsule, Rfl: 1  PAST MEDICAL HISTORY: Past Medical History:  Diagnosis Date   Arthritis    Asthma    Cancer (HCC)    cancerous cells found on pap smear- removed surgically   Diabetes mellitus without complication (HCC)    Fibromyalgia    Hypertension    MS (multiple sclerosis)    Sleep apnea     PAST SURGICAL HISTORY: Past Surgical History:  Procedure Laterality Date   APPENDECTOMY     CARDIAC DEFIBRILLATOR PLACEMENT Left    EYE SURGERY Bilateral 2023   repaired torn retina   KNEE SURGERY     TOTAL HIP ARTHROPLASTY Right 12/28/2022   Procedure: RIGHT TOTAL HIP ARTHROPLASTY ANTERIOR APPROACH;  Surgeon: Vernetta Lonni GRADE, MD;  Location: [facility] OR;  Service: Orthopedics;  Laterality: Right;   VAGINA SURGERY     pt states the doctor surgically removed cancerous cells from vaginal area    FAMILY HISTORY: Family History  Problem Relation Age of Onset    Alopecia Mother    Cancer Mother    Cancer Father    Diabetes Father    Cancer Sister    Multiple sclerosis Cousin     SOCIAL HISTORY:  Social History   Socioeconomic History   Marital status: Divorced  Spouse name: Not on file   Number of children: 2   Years of education: Not on file   Highest education level: High school graduate  Occupational History   Not on file  Tobacco Use   Smoking status: Never   Smokeless tobacco: Never  Vaping Use   Vaping status: Never Used  Substance and Sexual Activity   Alcohol use: Not Currently   Drug use: Never   Sexual activity: Not on file  Other Topics Concern   Not on file  Social History Narrative   Lives w daughter   Right handed   Caffeine: occa drinks tea   Social Drivers of Health   Financial Resource Strain: Not on file  Food Insecurity: Food Insecurity Present (09/22/2024)   Received from [facility]   Hunger Vital Sign    Within the past 12 months, you worried that your food would run out before you got the money to buy more.: Often true    Within the past 12 months, the food you bought just didn't last and you didn't have money to get more.: Often true  Transportation Needs: Unmet Transportation Needs (09/22/2024)   Received from [facility] - Transportation    In the past 12 months, has lack of transportation kept you from medical appointments or from getting medications?: Yes    In the past 12 months, has lack of transportation kept you from meetings, work, or from getting things needed for daily living?: No  Physical Activity: Not on file  Stress: Not on file  Social Connections: Unknown (06/23/2023)   Received from [facility]   Social Network    Social Network: Not on file  Intimate Partner Violence: Not At Risk (09/22/2024)   Received from [facility]   Humiliation, Afraid, Rape, and Kick questionnaire    Within the last year, have you been afraid of your partner or  ex-partner?: No    Within the last year, have you been humiliated or emotionally abused in other ways by your partner or ex-partner?: No    Within the last year, have you been kicked, hit, slapped, or otherwise physically hurt by your partner or ex-partner?: No    Within the last year, have you been raped or forced to have any kind of sexual activity by your partner or ex-partner?: No     PHYSICAL EXAM  Vitals:   11/13/24 1052  BP: 100/68  Pulse: 84  Weight: 187 lb (84.8 kg)  Height: 5' 4 (1.626 m)     Body mass index is 32.1 kg/m.   General: The patient is well-developed and well-nourished and in no acute distress  HEENT:  Head is [place]/AT.  Sclera are anicteric.   Skin: Extremities are without rash   She has mild ankle edema, right > left  Neurologic Exam  Mental status: The patient is alert and oriented x 3 at the time of the examination. The patient has apparent normal recent and remote memory, with an apparently normal attention span and concentration ability.   Speech is normal.  Cranial nerves: Extraocular movements are full.  Facial strength and sensation is normal.. No obvious hearing deficits are noted.  Motor:  Muscle bulk is normal.   Tone is normal. Strength is  5 / 5 in arms and 4+ in iliopsoas and ankle/toe extensors and 5/5 elsewhere in legs.     Sensory: Sensory testing is intact to soft touch and vibration sensation in all 4  extremities.  Coordination: Cerebellar testing reveals good finger-nose-finger and heel-to-shin bilaterally.  Gait and station: Station is normal.   Her gait is arthritic and mildly wide.  Slight foot drops.  Tandem walk is very wide. .  Romberg is negative.    Reflexes: Deep tendon reflexes are symmetric and normal in arms, trace at knees.  DTRs are absent in the ankles.  No clonus.SABRA      DIAGNOSTIC DATA (LABS, IMAGING, TESTING) - I reviewed patient records, labs, notes, testing and imaging myself where available.  Lab Results   Component Value Date   WBC 10.0 05/08/2024   HGB 14.9 05/08/2024   HCT 46.2 05/08/2024   MCV 92 05/08/2024   PLT 239 05/08/2024      Component Value Date/Time   NA 141 11/12/2024 1325   K 4.1 11/12/2024 1325   CL 102 11/12/2024 1325   CO2 23 11/12/2024 1325   GLUCOSE 103 (H) 11/12/2024 1325   GLUCOSE 132 (H) 01/02/2023 0252   BUN 17 11/12/2024 1325   CREATININE 0.76 11/12/2024 1325   CALCIUM  9.9 11/12/2024 1325   PROT 7.4 11/12/2024 1325   ALBUMIN 4.5 11/12/2024 1325   AST 19 11/12/2024 1325   ALT 13 11/12/2024 1325   ALKPHOS 55 11/12/2024 1325   BILITOT 0.3 11/12/2024 1325   GFRNONAA >60 01/02/2023 0252       ASSESSMENT AND PLAN  Active secondary progressive multiple sclerosis  High risk medication use  Urinary dysfunction  Hypertension, unspecified type  Acute on chronic systolic heart failure (HCC)   She will continue Rebif .  We discussed the association between age and MS activity.   We discussed considering d/c the medication but she is not interested in stopping the medication as she is stable Exam unchanged compared to earlier this year.    The active and exercise as tolerated.  Renew Lyrica  Return in 7-8 months or sooner if new or worsening symptoms.  This visit is part of a comprehensive longitudinal care medical relationship regarding the patients primary diagnosis of MS and related concerns.   Koni Kannan A. Vear, MD, [facility] 11/13/2024, 11:28 AM Certified in Neurology, Clinical Neurophysiology, Sleep Medicine and Neuroimaging  [facility] [address], [address] [place], [place] [address] [phone]

## 2024-11-20 ENCOUNTER — Other Ambulatory Visit: Payer: Self-pay

## 2024-11-20 ENCOUNTER — Other Ambulatory Visit: Payer: Self-pay | Admitting: Neurology

## 2024-11-26 ENCOUNTER — Ambulatory Visit: Admitting: Cardiology

## 2024-11-28 ENCOUNTER — Encounter: Payer: Self-pay | Admitting: Dietician

## 2024-11-28 ENCOUNTER — Encounter: Attending: Cardiology | Admitting: Dietician

## 2024-11-28 DIAGNOSIS — I5023 Acute on chronic systolic (congestive) heart failure: Secondary | ICD-10-CM

## 2024-11-28 DIAGNOSIS — E119 Type 2 diabetes mellitus without complications: Secondary | ICD-10-CM | POA: Diagnosis not present

## 2024-11-28 DIAGNOSIS — Z713 Dietary counseling and surveillance: Secondary | ICD-10-CM | POA: Diagnosis not present

## 2024-11-28 DIAGNOSIS — I5022 Chronic systolic (congestive) heart failure: Secondary | ICD-10-CM | POA: Diagnosis present

## 2024-11-28 NOTE — Progress Notes (Signed)
 Medical Nutrition Therapy  Appointment Start time:  [phone]  Appointment End time:  1045  Primary concerns today: Heart Healthy Nutrition  Referral diagnosis: I50.22 (ICD-10-CM) - Chronic systolic heart failure (HCC), Z88.0 (ICD-10-CM) - Type 2 diabetes mellitus without complications Preferred learning style: No preference indicated Learning readiness: Ready   NUTRITION ASSESSMENT   Clinical Medical Hx: CHF, HTN, HLD, T2DM, OSA, MS Medications: Reviewed Labs: BNP - 1,172, Glucose - 103  Notable Signs/Symptoms: Edema BLE, Fatigue  Lifestyle & Dietary Hx Pt reports bout of progressing SOB, went to hospital and was found to be in CHF. Pt reports getting pacemaker/defibrillator put in the first week of October, feeling significant fatigue on a regular basis now. Pt reports edema to BLE, taking Torsemide  every other day and follows 64 oz fluid restrictions, states they have seen improvements in swelling recently. Pt reports they have not checked glucose in a while, states they lost their glucometer. Pt reports reading labels for sodium, choosing less canned goods, less processed meats, no added salt to foods now. Pt reports usually eating oatmeal or grits with 2 turkey sausage links for breakfast (~300-350 mg sodium), or Raisin Bran w/ 2% milk (~300 mg sodium) Pt reports snacking on raisins, baby carrots or celery w/ ranch, fruit cups.   Estimated daily fluid intake: <64 oz Supplements: N/A Sleep: Stays up late but sleeps well (used to work 3rd shift) Stress / self-care: Higher due to health complications Current average weekly physical activity: ADLs    24-Hr Dietary Recall First Meal: No breakfast Snack:  Second Meal: Dressing, sweet potatoes, greens, cranberry sauce Snack:  Third Meal: Frozen chicken pot pie, grape juice Snack:  Beverages: Grape juice, Sweet tea    NUTRITION DIAGNOSIS  NB-1.1 Food and nutrition-related knowledge deficit As related to CHF.  As evidenced by EF  of 20-25%, dietary history high in sodium, packaged foods, saturated fats.   NUTRITION INTERVENTION  Nutrition education (E-1) on the following topics:  Educated patient on an appropriate blood pressure goal of <120/80. Educated patient on the relationship between dietary sodium intake and cardiovascular function. Recommended 1,500 mg of sodium per day. Educated patient on common sources of high sodium foods including packaged/processed foods, deli meats, fast foods, pickled food, sports drinks, and canned foods. Educated patient on the combined effect of hypertension and elevated cholesterol on cardiovascular health.   Handouts Provided Include  Heart Failure Nutrition Therapy Cardiac TLC Nutrition Therapy Heart Healthy Label Reading Tips Balance Plate Protein Foods List AccuChek guide voucher  Learning Style & Readiness for Change Teaching method utilized: Visual & Auditory  Demonstrated degree of understanding via: Teach Back  Barriers to learning/adherence to lifestyle change: None  Goals Established by Pt Continue to choose low sodium foods like no salt added canned goods, reduced sodium packaged items, and salt-free seasonings. Try to limit sodium to less than 1,500 mg daily. Continue to follow fluid restrictions of no more than 64 oz daily! Look up nutrition facts for restaurants you plan to visit to choose lower sodium options! Use your Protein Foods List to choose LEAN protein choices.   MONITORING & EVALUATION Dietary intake, weekly physical activity, and cardiovascular health in 6 weeks.  Next Steps  Patient is to follow up with RD.

## 2024-11-28 NOTE — Patient Instructions (Addendum)
 Continue to choose low sodium foods like no salt added canned goods, reduced sodium packaged items, and salt-free seasonings.  Try to limit sodium to less than 1,500 mg daily. Continue to follow fluid restrictions of no more than 64 oz daily!  Look up nutrition facts for restaurants you plan to visit to choose lower sodium options!  Use your Protein Foods List to choose LEAN protein choices.

## 2024-12-10 ENCOUNTER — Telehealth: Payer: Self-pay | Admitting: Neurology

## 2024-12-10 NOTE — Telephone Encounter (Signed)
 I called spoke to pt.  She said that she was calling aobut her Vit D level.  Her pcp said that due to her level, her pcp would not fill it.  She is on D2 50,000 units once weekly.  She did not know her level.  She knew with her MS that Dr. Vear had mentioned about taking this.  Her pcp is Dr. Sula Sigrid Kays with [facility] on [address] [place].  [phone].  I would try to get her level.  Her last level 104 on 05-08-2024 in [facility].  Spoke to taking supplement down by 1/2. (Take 1 every other week).

## 2024-12-10 NOTE — Telephone Encounter (Signed)
 Pt called to request to speak to MD about if she is suppose to take this medication  levocetirizine (XYZAL) 5 MG tablet   Pt stated that PCP  stated she doesn't have to take medication  ,However Pt is confused and would like to know if she should be take medication

## 2024-12-12 NOTE — Telephone Encounter (Signed)
 I called pt back and asked her to get the Vit D level to us  from her pcp.  I did relay that from a previous note 04/2024 that her vit d was slightly elevated and he recommended to take 1 pill every other week.

## 2024-12-17 ENCOUNTER — Telehealth: Payer: Self-pay | Admitting: *Deleted

## 2024-12-17 ENCOUNTER — Ambulatory Visit (HOSPITAL_COMMUNITY)
Admission: RE | Admit: 2024-12-17 | Discharge: 2024-12-17 | Disposition: A | Discharge status: Home / Self Care | Source: Ambulatory Visit | Attending: Cardiology | Admitting: Cardiology

## 2024-12-17 DIAGNOSIS — I5023 Acute on chronic systolic (congestive) heart failure: Secondary | ICD-10-CM | POA: Diagnosis present

## 2024-12-17 LAB — ECHOCARDIOGRAM COMPLETE
AR max vel: 1.84 cm2
AV Area VTI: 1.57 cm2
AV Area mean vel: 1.76 cm2
AV Mean grad: 4 mmHg
AV Peak grad: 7.5 mmHg
Ao pk vel: 1.37 m/s
Area-P 1/2: 4.36 cm2
Est EF: 30
S' Lateral: 4.38 cm

## 2024-12-17 NOTE — Telephone Encounter (Signed)
 I have called pcp and asked for them to fax [phone] pts vit d level. She will send to med tech.

## 2024-12-29 ENCOUNTER — Other Ambulatory Visit: Payer: Self-pay | Admitting: Neurology

## 2024-12-29 DIAGNOSIS — G35D Multiple sclerosis, unspecified: Secondary | ICD-10-CM

## 2024-12-30 NOTE — Progress Notes (Unsigned)
 Patient ID: Alisha Martinez                 DOB: 1956/04/12                      MRN: [id number]     HPI: Alisha Martinez is a 69 y.o. female referred by Dr. Elmira to pharmacy clinic for HF medication management. PMH is significant for hypertension, hyperlipidemia, type 2 DM, nonischemic cardiomyopathy, s/p CRT-D. Most recent LVEF 30% on 12/17/2024.  Check on K supplemnt Loop use and still on Chlorthalidone ?  Discussion with patient today included the following: cardiac medication indications, introduction to GDMT clinic, reasoning behind titration, importance of medication adherence, and patient engagement. . At last visit with MD ***. Symptomatically, she is feeling ***, *** dizziness, lightheadedness, and fatigue. *** chest pain or palpitations. Feels SOB when ***. Able to complete all ADLs. Activity level ***. She *** checks her weight at home (normal range *** - *** lbs). *** LEE, PND, or orthopnea. Appetite has been ***. She *** adheres to a low-salt diet.      Current CHF meds: Jardiance  10 mg daily, torsemide  20 mg daily PRN,  Losartan  25 mg daily, Spironolactone  12.5 mg daily  Previously tried:  Adherence Assessment  Do you ever forget to take your medication? [] Yes [] No  Do you ever skip doses due to side effects? [] Yes [] No  Do you have trouble affording your medicines? [] Yes [] No  Are you ever unable to pick up your medication due to transportation difficulties? [] Yes [] No  Do you ever stop taking your medications because you don't believe they are helping? [] Yes [] No  Do you check your weight daily? [] Yes [] No   Adherence strategy: ***  Barriers to obtaining medications: ***  BP goal:   Family History:   Social History:   Diet:   Exercise:   Home BP readings:   Wt Readings from Last 3 Encounters:  11/13/24 187 lb (84.8 kg)  11/12/24 187 lb 14.4 oz (85.2 kg)  05/08/24 200 lb 8 oz (90.9 kg)   BP Readings from Last 3 Encounters:  11/13/24 100/68  11/12/24  107/73  05/08/24 112/70   Pulse Readings from Last 3 Encounters:  11/13/24 84  11/12/24 (!) 127  05/08/24 92    Renal function: CrCl cannot be calculated (Patient's most recent lab result is older than the maximum 21 days allowed.).  Past Medical History:  Diagnosis Date   Arthritis    Asthma    Cancer (HCC)    cancerous cells found on pap smear- removed surgically   Diabetes mellitus without complication (HCC)    Fibromyalgia    Hypertension    MS (multiple sclerosis)    Sleep apnea     Medications Ordered Prior to Encounter[1]  Allergies[2]   Assessment/Plan:  1. CHF -  No problems updated. No problem-specific Assessment & Plan notes found for this encounter.      Thank you   Robbi Blanch, Pharm.D [place] Alisha Martinez. [facility] [address], [place], [place] [address] Phone: [phone]; Fax: [phone]     [1]  Current Outpatient Medications on File Prior to Visit  Medication Sig Dispense Refill   albuterol (VENTOLIN HFA) 108 (90 Base) MCG/ACT inhaler Inhale into the lungs.     aspirin  81 MG chewable tablet Chew 1 tablet (81 mg total) by mouth 2 (two) times daily. 30 tablet 0   chlorthalidone  (HYGROTON ) 25  MG tablet Take 25 mg by mouth daily.     empagliflozin  (JARDIANCE ) 10 MG TABS tablet Take 1 tablet (10 mg total) by mouth daily. 90 tablet 3   ergocalciferol  (VITAMIN D2) 1.25 MG (50000 UT) capsule Take 50,000 Units by mouth every Friday.     Interferon Beta-1a  (REBIF  REBIDOSE) 44 MCG/0.5ML SOAJ Inject 0.5 mLs into the skin 3 (three) times a week. Must keep follow up 05/14/2024 for ongoing refills 6 mL 3   levocetirizine (XYZAL) 5 MG tablet Take 5 mg by mouth every evening.     losartan  (COZAAR ) 25 MG tablet Take 1/2 tablets (12.5 mg total) by mouth daily. 30 tablet 2   metFORMIN  (GLUCOPHAGE ) 1000 MG tablet Take 1,000 mg by mouth 2 (two) times daily with a meal.     metoprolol  succinate (TOPROL -XL) 25 MG 24  hr tablet Take 1 tablet (25 mg total) by mouth daily. 90 tablet 3   MYRBETRIQ  50 MG TB24 tablet TAKE 1 TABLET(50 MG) BY MOUTH DAILY 30 tablet 4   oxybutynin  (DITROPAN  XL) 15 MG 24 hr tablet Take 15 mg by mouth at bedtime.     potassium chloride  SA (KLOR-CON  M) 20 MEQ tablet Take 1 tablet (20 mEq total) by mouth 2 (two) times daily. (Patient not taking: Reported on 11/28/2024) 30 tablet 0   pregabalin  (LYRICA ) 100 MG capsule Take 1 capsule (100 mg total) by mouth 2 (two) times daily. 180 capsule 1   rosuvastatin  (CRESTOR ) 10 MG tablet Take 10 mg by mouth daily.     spironolactone  (ALDACTONE ) 25 MG tablet Take 1/2 tablet (12.5 mg total) by mouth daily. 15 tablet 2   torsemide  (DEMADEX ) 20 MG tablet Take 1 tablet (20 mg total) by mouth as needed.     No current facility-administered medications on file prior to visit.  [2]  Allergies Allergen Reactions   Compazine [Prochlorperazine]     Muscle pain   Lisinopril Swelling

## 2024-12-31 ENCOUNTER — Ambulatory Visit: Attending: Physician Assistant | Admitting: Pharmacist

## 2024-12-31 ENCOUNTER — Encounter: Payer: Self-pay | Admitting: Pharmacist

## 2024-12-31 VITALS — BP 111/74 | HR 73 | Wt 199.0 lb

## 2024-12-31 DIAGNOSIS — I5023 Acute on chronic systolic (congestive) heart failure: Secondary | ICD-10-CM | POA: Diagnosis not present

## 2024-12-31 MED ORDER — LOSARTAN POTASSIUM 25 MG PO TABS: 25.0000 mg | ORAL_TABLET | Freq: Every day | ORAL | 3 refills | Status: DC

## 2024-12-31 NOTE — Assessment & Plan Note (Signed)
 Assessment: In office BP 111/74  mmHg heart rate 73 goal (<130/80). Has been taking losartan  25 mg daily instead of 12.5 mg daily   Home BP ~ 107-121/70-78 she doesn't recall heart rate Tolerates current HF well without any side effects   Takes torsemide  every other day swelling is under control  Denies SOB, palpitation, chest pain, headaches,Denies  LEE, PND, or orthopnea. Appetite has been normal . She adheres to a low-salt diet.   Plan:  Continue taking Jardiance  10 mg daily, torsemide  20 mg every other day, Losartan  25 mg daily, Spironolactone  12.5 mg daily, metoprolol  XL 25 mg daily Patient to keep record of BP readings with heart rate and report to us  at the next visit Patient to see PharmD in 4-6  weeks for follow up  Follow up lab(s): none  In future if BP remains in good range we can titrate up losartan  or beta-blocker

## 2024-12-31 NOTE — Patient Instructions (Addendum)
 Changes made by your pharmacist Robbi Blanch, PharmD at today's visit:    Instructions/Changes  (what do you need to do) Your Notes  (what you did and when you did it)  Keep taking losartan  1 tab daily   Continue taking  Jardiance  10 mg daily, torsemide  20 mg daily when needed for swelling,  Spironolactone  12.5 mg daily, metoprolol  XL 25 mg daily    Check your BP daily     Bring all of your meds, your BP cuff and your record of home blood pressures to your next appointment.    HOW TO TAKE YOUR BLOOD PRESSURE AT HOME  Rest 5 minutes before taking your blood pressure.  Dont smoke or drink caffeinated beverages for at least 30 minutes before. Take your blood pressure before (not after) you eat. Sit comfortably with your back supported and both feet on the floor (dont cross your legs). Elevate your arm to heart level on a table or a desk. Use the proper sized cuff. It should fit smoothly and snugly around your bare upper arm. There should be enough room to slip a fingertip under the cuff. The bottom edge of the cuff should be 1 inch above the crease of the elbow. Ideally, take 3 measurements at one sitting and record the average.  Important lifestyle changes to control high blood pressure  Intervention  Effect on the BP  Lose extra pounds and watch your waistline Weight loss is one of the most effective lifestyle changes for controlling blood pressure. If you're overweight or obese, losing even a small amount of weight can help reduce blood pressure. Blood pressure might go down by about 1 millimeter of mercury (mm Hg) with each kilogram (about 2.2 pounds) of weight lost.  Exercise regularly As a general goal, aim for at least 30 minutes of moderate physical activity every day. Regular physical activity can lower high blood pressure by about 5 to 8 mm Hg.  Eat a healthy diet Eating a diet rich in whole grains, fruits, vegetables, and low-fat dairy products and low in saturated fat and  cholesterol. A healthy diet can lower high blood pressure by up to 11 mm Hg.  Reduce salt (sodium) in your diet Even a small reduction of sodium in the diet can improve heart health and reduce high blood pressure by about 5 to 6 mm Hg.  Limit alcohol One drink equals 12 ounces of beer, 5 ounces of wine, or 1.5 ounces of 80-proof liquor.  Limiting alcohol to less than one drink a day for women or two drinks a day for men can help lower blood pressure by about 4 mm Hg.   If you have any questions or concerns please use My Chart to send questions or call the office at [phone]

## 2025-01-02 ENCOUNTER — Encounter: Payer: Self-pay | Admitting: Cardiology

## 2025-01-02 ENCOUNTER — Ambulatory Visit: Attending: Cardiovascular Disease | Admitting: Cardiology

## 2025-01-02 VITALS — BP 95/65 | HR 78 | Ht 64.0 in | Wt 201.0 lb

## 2025-01-02 DIAGNOSIS — I5023 Acute on chronic systolic (congestive) heart failure: Secondary | ICD-10-CM | POA: Diagnosis not present

## 2025-01-02 DIAGNOSIS — I5020 Unspecified systolic (congestive) heart failure: Secondary | ICD-10-CM | POA: Diagnosis not present

## 2025-01-02 MED ORDER — LOSARTAN POTASSIUM 25 MG PO TABS: 25.0000 mg | ORAL_TABLET | Freq: Every day | ORAL | 5 refills | Status: AC

## 2025-01-02 MED ORDER — TORSEMIDE 20 MG PO TABS: 20.0000 mg | ORAL_TABLET | Freq: Every day | ORAL | 5 refills | Status: AC

## 2025-01-02 MED ORDER — EMPAGLIFLOZIN 10 MG PO TABS: 10.0000 mg | ORAL_TABLET | Freq: Every day | ORAL | 5 refills | Status: AC

## 2025-01-02 MED ORDER — METOPROLOL SUCCINATE ER 25 MG PO TB24: 25.0000 mg | ORAL_TABLET | Freq: Every day | ORAL | 5 refills | Status: AC

## 2025-01-02 MED ORDER — ASPIRIN 81 MG PO CHEW: 81.0000 mg | CHEWABLE_TABLET | Freq: Two times a day (BID) | ORAL | 5 refills | Status: AC

## 2025-01-02 MED ORDER — SPIRONOLACTONE 25 MG PO TABS: 12.5000 mg | ORAL_TABLET | Freq: Every day | ORAL | 5 refills | Status: DC

## 2025-01-02 MED ORDER — ROSUVASTATIN CALCIUM 10 MG PO TABS: 10.0000 mg | ORAL_TABLET | Freq: Every day | ORAL | 5 refills | Status: AC

## 2025-01-02 NOTE — Patient Instructions (Signed)
 Medication Instructions:  Please TAKE YOUR TORSEMIDE  20mg  EVERYDAY.   *If you need a refill on your cardiac medications before your next appointment, please call your pharmacy*  Follow-Up: At [facility], you and your health needs are our priority.  As part of our continuing mission to provide you with exceptional heart care, our providers are all part of one team.  This team includes your primary Cardiologist (physician) and Advanced Practice Providers or APPs (Physician Assistants and Nurse Practitioners) who all work together to provide you with the care you need, when you need it.  Your next appointment:   3 month(s)  Provider:   Newman JINNY Lawrence, MD  We recommend signing up for the patient portal called MyChart.  Sign up information is provided on this After Visit Summary.  MyChart is used to connect with patients for Virtual Visits (Telemedicine).  Patients are able to view lab/test results, encounter notes, upcoming appointments, etc.  Non-urgent messages can be sent to your provider as well.   To learn more about what you can do with MyChart, go to [email or web address].   Other Instructions Keep appointment with Heart Failure Clinic

## 2025-01-02 NOTE — Progress Notes (Addendum)
 " Cardiology Office Note:  .   Date:  01/02/2025  ID:  Alisha Martinez, DOB 24-Sep-1956, MRN [id number] PCP: Alisha Deidra Fox, MD  [facility] Providers Cardiologist:  Alisha Lawrence, MD PCP: Alisha Deidra, Fox, MD  Chief Complaint  Patient presents with   HFrEF     Alisha Martinez is a 69 y.o. female with hypertension, hyperlipidemia, type 2 DM, nonischemic cardiomyopathy, s/p CRT-D  Discussed the use of AI scribe software for clinical note transcription with the patient, who gave verbal consent to proceed.  History of Present Illness  Patient is originally from [place], [place], has been living with her daughter here in town. Patient was last seen by Dr. Alvan in 02/2024.  Previous workup that showed LVEF mildly reduced at 40-45%, and stress test raising suspicion of possible old infarct versus apical thinning.  Patient was recently hospitalized at [facility] in [place], [place] in 09/2024 with worsening symptoms of heart failure and concern for early cardiogenic shock/low output heart failure.  She was initially started on inotrope support, but did not tolerate this due to tachyarrhythmia, therefore inotropes were discontinued.  She underwent CRT placement with the hopes that would lead to long-term reverse remodeling and overall improvement in her heart failure status.  Her acute hemodynamics did improve with CRT and slow initiation/reinitiation of GDMT.  She tolerated p.o.'s well.  At conclusion of her hospital stay, she was doing well hemodynamically, well tolerating partial GDMT and maintaining volume status.  Since her last visit with me, she is compliant with medical therapy.  Her walking is limited more due to her hip pain and dyspnea on exertion.  She has noticed leg swelling in both legs.    Vitals:   01/02/25 1031  BP: 95/65  Pulse: 78  SpO2: 94%       Review of Systems  Constitutional: Positive for malaise/fatigue.  Cardiovascular:  Positive for dyspnea  on exertion and leg swelling. Negative for chest pain, palpitations and syncope.        Studies Reviewed: SABRA        EKG 11/12/2024: Ventricular-paced rhythm When compared with ECG of 29-Dec-2022 12:56, Electronic ventricular pacemaker has replaced Sinus rhythm    Echocardiogram 11/2024:  1. Left ventricular ejection fraction, by estimation, is 30%. The left  ventricle has severely decreased function. Left ventricular endocardial  border not optimally defined to evaluate regional wall motion. The left  ventricular internal cavity size was mildly dilated. Left ventricular  diastolic parameters are consistent with Grade I diastolic dysfunction (impaired relaxation).   2. Right ventricular systolic function is mildly reduced. The right  ventricular size is mildly enlarged. There is normal pulmonary artery  systolic pressure. The estimated right ventricular systolic pressure is  29.4 mmHg.   3. A small pericardial effusion is present.   4. The mitral valve is degenerative. Trivial mitral valve regurgitation.  No evidence of mitral stenosis.   5. The aortic valve is grossly normal. There is mild calcification of the  aortic valve. Aortic valve regurgitation is trivial. No aortic stenosis is  present.   6. The inferior vena cava is normal in size with greater than 50%  respiratory variability, suggesting right atrial pressure of 3 mmHg.   Comparison(s): Prior images reviewed side by side. EF grossly unchanged  despite differences in reporting.   RHC/LHC 09/24/2024:  Mildly elevated bilateral pressures (RA 8, PCWP 18), and low cardiac output (CI 1.52 by Fick). Non obstructive CAD.   Echocardiogram 09/22/2024: ECHO obtained 9/27  demonstrating acute on chronic HFrEF with EF 23%, G2DD, and no significant valvular abnormalities.   Labs 10/2024: Chol 163, TG 68, HDL 59, LDL 91 Cr 0.76 ProBNP 1172  Labs 09/2024: Hb 14.3 Cr 1.06, eGFR 58, K 4.1 ProBNP 349   Labs 04/2024: Hb  14.9 Cr 0.85  Echocardiogram 12/2022:  1. Akinesis of the anteroseptal/anterior/inferoseptal wall from base to  apex. Left ventricular ejection fraction, by estimation, is 40 to 45%. The  left ventricle has mildly decreased function. The left ventricle demonstrates  global hypokinesis. Left ventricular diastolic parameters are indeterminate.   2. Right ventricular systolic function is normal. The right ventricular  size is normal. Tricuspid regurgitation signal is inadequate for assessing  PA pressure.   3. No evidence of mitral valve regurgitation.   4. The aortic valve is grossly normal. Aortic valve regurgitation is not  visualized.   5. The inferior vena cava is normal in size with greater than 50%  respiratory variability, suggesting right atrial pressure of 3 mmHg.   Comparison(s): No prior Echocardiogram.   Conclusion(s)/Recommendation(s): Has wall motion abnormality, consider  ischemic evaluation.   Stress test 01/2023:   A pharmacological stress test was performed using IV Lexiscan  0.4mg  over 10 seconds performed without concurrent submaximal exercise.  This patient had abdominal pain and SOB with the Lexiscan .   No ST deviation was noted from baseline. Left bundle branch block was seen. ECG was uninterpretable due to left bundle branch block. The ECG was not diagnostic due to pharmacologic protocol.   Breast attenuation artifact was present.   LV perfusion is abnormal. There is no evidence of ischemia. There is evidence of infarction. Defect 1: There is a small defect with moderate reduction in uptake present in the apical apex location(s) that is fixed. There is abnormal wall motion in the defect area. Consistent with infarction vs. breast attenuation artifact.   Left ventricular function is normal. Nuclear stress EF: 28 %. The left ventricular ejection fraction is severely decreased (<30%). End diastolic cavity size is normal. End systolic cavity size is normal. No evidence of  transient ischemic dilation (TID) noted.   The study is normal. The study is high risk due to severe LV dysfunction.  11/2022: HbA1C 7.2%   Physical Exam Vitals and nursing note reviewed.  Constitutional:      General: She is not in acute distress. Neck:     Vascular: No JVD.  Cardiovascular:     Rate and Rhythm: Normal rate and regular rhythm.     Heart sounds: Normal heart sounds. No murmur heard. Pulmonary:     Effort: Pulmonary effort is normal.     Breath sounds: Normal breath sounds. No wheezing or rales.  Musculoskeletal:     Right lower leg: Edema (2+) present.     Left lower leg: Edema (2+) present.      VISIT DIAGNOSES:   ICD-10-CM   1. HFrEF (heart failure with reduced ejection fraction) (HCC)  I50.20     2. Acute on chronic systolic heart failure [facility])  P49.76         Alisha Martinez is a 69 y.o. female with hypertension, hyperlipidemia, type 2 DM, nonischemic cardiomyopathy, s/p CRT-D  Assessment & Plan  Acute on chronic systolic heart failure: Nonischemic cardiomyopathy, NYHA class III symptoms. S/p BiV ICD placement in [place], [place] during recent decompensation requiring hospitalization in [place], [place] in 09/2024. Clinically appears volume overloaded with 2+ bilateral edema today. Recommend increasing torsemide  from 20 mg every other day to  daily. Currently on losartan  25 mg daily, spironolactone  12.5 mg daily, metoprolol  succinate 25 mg daily. Further uptitration of GDMT limited due to low blood pressures. She has upcoming appointment with heart failure clinic and EP clinic, please keep the same. Discussed regarding low-salt diet with <2 g sodium. Note that I do not know the extent of her nonobstructive CAD, reasonable to continue aspirin  81 mg daily.  Mixed hyperlipidemia: Continue Crestor .   Meds ordered this encounter  Medications   aspirin  81 MG chewable tablet    Sig: Chew 1 tablet (81 mg total) by mouth 2 (two) times daily.    Dispense:   30 tablet    Refill:  5   empagliflozin  (JARDIANCE ) 10 MG TABS tablet    Sig: Take 1 tablet (10 mg total) by mouth daily.    Dispense:  90 tablet    Refill:  5   losartan  (COZAAR ) 25 MG tablet    Sig: Take 1 tablet (25 mg total) by mouth daily.    Dispense:  30 tablet    Refill:  5   metoprolol  succinate (TOPROL -XL) 25 MG 24 hr tablet    Sig: Take 1 tablet (25 mg total) by mouth daily.    Dispense:  30 tablet    Refill:  5   rosuvastatin  (CRESTOR ) 10 MG tablet    Sig: Take 1 tablet (10 mg total) by mouth daily.    Dispense:  30 tablet    Refill:  5   spironolactone  (ALDACTONE ) 25 MG tablet    Sig: Take 1/2 tablet (12.5 mg total) by mouth daily.    Dispense:  15 tablet    Refill:  5   torsemide  (DEMADEX ) 20 MG tablet    Sig: Take 1 tablet (20 mg total) by mouth daily.    Dispense:  30 tablet    Refill:  5     F/u in 3 months    Signed, Alisha JINNY Lawrence, MD  "

## 2025-01-17 ENCOUNTER — Ambulatory Visit (HOSPITAL_COMMUNITY)
Admission: RE | Admit: 2025-01-17 | Discharge: 2025-01-17 | Disposition: A | Discharge status: Home / Self Care | Source: Ambulatory Visit | Attending: Cardiology | Admitting: Cardiology

## 2025-01-17 ENCOUNTER — Encounter (HOSPITAL_COMMUNITY): Payer: Self-pay | Admitting: Cardiology

## 2025-01-17 ENCOUNTER — Ambulatory Visit (HOSPITAL_COMMUNITY): Payer: Self-pay | Admitting: Cardiology

## 2025-01-17 VITALS — BP 98/68 | HR 123 | Wt 192.4 lb

## 2025-01-17 DIAGNOSIS — I447 Left bundle-branch block, unspecified: Secondary | ICD-10-CM | POA: Diagnosis not present

## 2025-01-17 DIAGNOSIS — G35D Multiple sclerosis, unspecified: Secondary | ICD-10-CM | POA: Insufficient documentation

## 2025-01-17 DIAGNOSIS — Z7984 Long term (current) use of oral hypoglycemic drugs: Secondary | ICD-10-CM | POA: Diagnosis not present

## 2025-01-17 DIAGNOSIS — I5022 Chronic systolic (congestive) heart failure: Secondary | ICD-10-CM | POA: Insufficient documentation

## 2025-01-17 DIAGNOSIS — I2510 Atherosclerotic heart disease of native coronary artery without angina pectoris: Secondary | ICD-10-CM | POA: Diagnosis not present

## 2025-01-17 DIAGNOSIS — R9431 Abnormal electrocardiogram [ECG] [EKG]: Secondary | ICD-10-CM | POA: Insufficient documentation

## 2025-01-17 DIAGNOSIS — Z79899 Other long term (current) drug therapy: Secondary | ICD-10-CM | POA: Insufficient documentation

## 2025-01-17 DIAGNOSIS — Z95810 Presence of automatic (implantable) cardiac defibrillator: Secondary | ICD-10-CM | POA: Diagnosis not present

## 2025-01-17 DIAGNOSIS — E119 Type 2 diabetes mellitus without complications: Secondary | ICD-10-CM | POA: Insufficient documentation

## 2025-01-17 DIAGNOSIS — I5020 Unspecified systolic (congestive) heart failure: Secondary | ICD-10-CM | POA: Diagnosis not present

## 2025-01-17 DIAGNOSIS — M545 Low back pain, unspecified: Secondary | ICD-10-CM | POA: Diagnosis not present

## 2025-01-17 DIAGNOSIS — I110 Hypertensive heart disease with heart failure: Secondary | ICD-10-CM | POA: Insufficient documentation

## 2025-01-17 DIAGNOSIS — I428 Other cardiomyopathies: Secondary | ICD-10-CM | POA: Diagnosis not present

## 2025-01-17 LAB — PRO BRAIN NATRIURETIC PEPTIDE: Pro Brain Natriuretic Peptide: 127 pg/mL

## 2025-01-17 LAB — BASIC METABOLIC PANEL WITH GFR
Anion gap: 12 (ref 5–15)
BUN: 23 mg/dL (ref 8–23)
CO2: 29 mmol/L (ref 22–32)
Calcium: 9.8 mg/dL (ref 8.9–10.3)
Chloride: 96 mmol/L — ABNORMAL LOW (ref 98–111)
Creatinine, Ser: 0.92 mg/dL (ref 0.44–1.00)
GFR, Estimated: >60 mL/min
Glucose, Bld: 111 mg/dL — ABNORMAL HIGH (ref 70–99)
Potassium: 3.7 mmol/L (ref 3.5–5.1)
Sodium: 136 mmol/L (ref 135–145)

## 2025-01-17 LAB — LIPID PANEL
Cholesterol: 152 mg/dL (ref 0–200)
HDL: 66 mg/dL
LDL Cholesterol: 70 mg/dL (ref 0–99)
Total CHOL/HDL Ratio: 2.3 ratio
Triglycerides: 79 mg/dL
VLDL: 16 mg/dL (ref 0–40)

## 2025-01-17 MED ORDER — SPIRONOLACTONE 25 MG PO TABS: 25.0000 mg | ORAL_TABLET | Freq: Every day | ORAL | 3 refills | Status: AC

## 2025-01-17 NOTE — Patient Instructions (Addendum)
 CHANGE Spironolactone  to 25 mg daily.  Labs done today, your results will be available in MyChart, we will contact you for abnormal readings.  REPEAT blood work in 10 days.  Genetic testing has been collected, this has to be sent to [place]  for processing and can take 1-2 weeks for us  to get results back.  We will let you know the results once reviewed by your provider.  Your physician has requested that you have a cardiac MRI. Cardiac MRI uses a computer to create images of your heart as its beating, producing both still and moving pictures of your heart and major blood vessels. For further information please visit [email or web address]. Please follow the instruction sheet given to you today for more information.  Please follow up with our heart failure pharmacist in as scheduled  Your physician recommends that you schedule a follow-up appointment in: as scheduled   If you have any questions or concerns before your next appointment please send us  a message through [place] or call our office at [phone].    TO LEAVE A MESSAGE FOR THE NURSE SELECT OPTION 2, PLEASE LEAVE A MESSAGE INCLUDING: YOUR NAME DATE OF BIRTH CALL BACK NUMBER REASON FOR CALL**this is important as we prioritize the call backs  YOU WILL RECEIVE A CALL BACK THE SAME DAY AS LONG AS YOU CALL BEFORE 4:00 PM  At the [facility], you and your health needs are our priority. As part of our continuing mission to provide you with exceptional heart care, we have created designated Provider Care Teams. These Care Teams include your primary Cardiologist (physician) and Advanced Practice Providers (APPs- Physician Assistants and Nurse Practitioners) who all work together to provide you with the care you need, when you need it.   You may see any of the following providers on your designated Care Team at your next follow up: Dr Toribio Fuel Dr Ezra Shuck Dr. Morene Brownie Greig Mosses, NP Caffie Shed, [place] [facility] [place], [place] Beckey Coe, NP Jordan Lee, NP Ellouise Class, NP Tinnie Redman, PharmD Jaun Bash, PharmD   Please be sure to bring in all your medications bottles to every appointment.    Thank you for choosing [facility]

## 2025-01-17 NOTE — Progress Notes (Signed)
 PCP: [name], MD HF Cardiology: Dr. Rolan  Chief complaint: CHF  69 y.o. with history of chronic systolic CHF/nonischemic cardiomyopathy, HTN, multiple sclerosis, and diabetes was referred for evaluation of CHF by Dr. Elmira.  Patient initially had an echo done in 1/24, showing EF 40-45%.  Cardiolite in 2/24 showed EF 24% with apical fixed defect and no ischemia.  In the fall of 2025, she was hospitalized in [place]  with CHF while visiting family there.  Echo in 9/25 in [place]  showed EF 23%. LHC/RHC in 9/25 showed mild nonobstructive CAD; CI 1.52, mean RA 8, mean PCWP 18. She had implantation of a Medtronic CRT-D device given wide LBBB.   Echo was done in [place] in 12/25, this showed EF 30%, mild LV dilation, mild RV enlargement/mild dysfunction, IVC normal.   Niece and nephew both have cardiomyopathies.  Sister died at 37 of uncertain etiology.   She lives in [place] with her daughter.  HR was in the 120s-130s when she walked into the office with uncertain rhythm, but decreased to 70s and clearly NSR when she rested.  Device interrogation showed no atrial arrhythmias.  Patient is short of breath walking up stairs.  She has no problems on flat ground generally though she tires walking longer distances.  She has orthopnea and sleeps on her side or on the sofa.  No chest pain.  No lightheadedness though BP tends to run low (SBP 90s).  No PND.  She is also limited by low back pain.  Apparently she was on Entresto at one point but stopped it, perhaps due to low BP.   ECG (personally reviewed): tachycardic with BiV pacing.  Suspect sinus tachycardia based on device interrogation.   Medtronic device interrogation: 90% BiV pacing, no AF/VT. Stable thoracic impedance.   Labs (1/26): K 3.7, creatinine 0.92, LDL 70, pro-BNP 127  PMH: 1. Multiple sclerosis: On Rebif  Rebidose. Follows with neurology. 2. Type 2 diabetes 3. HTN 4. Chronic systolic CHF: Nonischemic cardiomyopathy.   Medtronic CRT-D device.  - Echo (1/24): EF 40-45%.  - Cardiolite (2/24): EF 24%, fixed apical defect with no ischemia.  - Echo (9/25, [place] ): EF 23% - LHC/RHC (9/25, [place] ): mild nonobstructive CAD; CI 1.52, mean RA 8, mean PCWP 18. - Echo (12/25): EF 30%, mild LV dilation, mild RV enlargement/mild dysfunction, IVC normal.   SH: Lives in [place] with daughter, from [place] .  No ETOH, drugs.  No smoking.   FH: Niece and nephew both have cardiomyopathies.  Sister died at 71 of uncertain etiology.   ROS: All systems reviewed and negative except as per HPI.   Current Outpatient Medications  Medication Sig Dispense Refill   albuterol (VENTOLIN HFA) 108 (90 Base) MCG/ACT inhaler Inhale into the lungs.     aspirin  81 MG chewable tablet Chew 1 tablet (81 mg total) by mouth 2 (two) times daily. 30 tablet 5   empagliflozin  (JARDIANCE ) 10 MG TABS tablet Take 1 tablet (10 mg total) by mouth daily. 90 tablet 5   ergocalciferol  (VITAMIN D2) 1.25 MG (50000 UT) capsule Take 50,000 Units by mouth every Friday.     levocetirizine (XYZAL) 5 MG tablet Take 5 mg by mouth every evening.     losartan  (COZAAR ) 25 MG tablet Take 1 tablet (25 mg total) by mouth daily. 30 tablet 5   metFORMIN  (GLUCOPHAGE ) 1000 MG tablet Take 1,000 mg by mouth 2 (two) times daily with a meal.     metoprolol  succinate (TOPROL -XL) 25 MG 24 hr tablet Take 1  tablet (25 mg total) by mouth daily. 30 tablet 5   MYRBETRIQ  50 MG TB24 tablet TAKE 1 TABLET(50 MG) BY MOUTH DAILY 30 tablet 4   oxybutynin  (DITROPAN  XL) 15 MG 24 hr tablet Take 15 mg by mouth at bedtime.     pregabalin  (LYRICA ) 100 MG capsule Take 100 mg by mouth 3 (three) times daily.     REBIF  REBIDOSE 44 MCG/0.5ML SOAJ INJECT 44 MCG (0.5 ML) SUBCUTANEOUSLY 3 TIMES A WEEK 6 mL 3   rosuvastatin  (CRESTOR ) 10 MG tablet Take 1 tablet (10 mg total) by mouth daily. 30 tablet 5   torsemide  (DEMADEX ) 20 MG tablet Take 1 tablet (20 mg total) by mouth daily. 30 tablet 5   spironolactone   (ALDACTONE ) 25 MG tablet Take 1 tablet (25 mg total) by mouth daily. 90 tablet 3   No current facility-administered medications for this encounter.   BP 98/68   Pulse (!) 123   Wt 87.3 kg (192 lb 6.4 oz)   SpO2 97%   BMI 33.03 kg/m  General: NAD Neck: No JVD, no thyromegaly or thyroid nodule.  Lungs: Clear to auscultation bilaterally with normal respiratory effort. CV: Nondisplaced PMI.  Heart regular S1/S2, no S3/S4, no murmur.  No peripheral edema.  No carotid bruit.  Normal pedal pulses.  Abdomen: Soft, nontender, no hepatosplenomegaly, no distention.  Skin: Intact without lesions or rashes.  Neurologic: Alert and oriented x 3.  Psych: Normal affect. Extremities: No clubbing or cyanosis.  HEENT: Normal.   Assessment/Plan: 1. Chronic systolic CHF: Nonischemic cardiomyopathy.  Patient initially had an echo done in 1/24, showing EF 40-45%.  Cardiolite in 2/24 showed EF 24% with apical fixed defect and no ischemia.  In the fall of 2025, she was hospitalized in [place]  with CHF while visiting family there.  Echo in 9/25 in [place]  showed EF 23%. LHC/RHC in 9/25 showed mild nonobstructive CAD; CI 1.52, mean RA 8, mean PCWP 18. She had implantation of a Medtronic CRT-D device given wide LBBB. Echo in 12/25 showed EF 30%, mild LV dilation, mild RV enlargement/mild dysfunction, IVC normal.  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  However, no cardiac disease in her parents. NYHA class II, she is not volume overloaded by exam or Optivol, pro-BNP not elevated today. GDMT has been limited by low BP.  - Continue Jardiance  10 mg daily.  - Continue losartan  25 mg daily.  May be able to transition to Entresto in the future if BP remains stable.  - Increase spironolactone  to 25 mg daily.  BMET in 10 days.  - Continue Toprol  XL 25 mg daily.  - Cardiac MRI to assess for infiltrative disease or myocarditis.  Her device is MRI  compatible.  - I will send gene testing given concern for familial cardiomyopathy.  We discussed the implications of testing and she elects to proceed.  - I will arrange for EP followup for device.  2. Multiple sclerosis: Followed by neurology, stable.  3. Type 2 diabetes: Per PCP.   Followup in 3 wks with HF pharmacy. See me in 6 wks.   I spent 56 minutes reviewing records, interviewing/examining patient, and managing orders.    Ezra Shuck 01/17/2025

## 2025-01-17 NOTE — Progress Notes (Signed)
 Cardiomyopathy genetic testing collected via Blood  per Dr Mitzie Anda.  Order form completed, signed and shipped with sample by [employer] to [facility].

## 2025-01-22 ENCOUNTER — Encounter: Admitting: Dietician

## 2025-01-28 ENCOUNTER — Ambulatory Visit (HOSPITAL_COMMUNITY)

## 2025-02-01 ENCOUNTER — Ambulatory Visit (HOSPITAL_COMMUNITY)

## 2025-02-04 ENCOUNTER — Ambulatory Visit (HOSPITAL_COMMUNITY)

## 2025-02-04 ENCOUNTER — Ambulatory Visit: Admitting: Cardiovascular Disease

## 2025-02-05 ENCOUNTER — Encounter: Admitting: Dietician

## 2025-02-07 ENCOUNTER — Ambulatory Visit (HOSPITAL_COMMUNITY)

## 2025-02-08 ENCOUNTER — Ambulatory Visit (HOSPITAL_COMMUNITY)

## 2025-03-07 ENCOUNTER — Ambulatory Visit (HOSPITAL_COMMUNITY): Admitting: Cardiology

## 2025-06-18 ENCOUNTER — Ambulatory Visit: Admitting: Neurology
